# Patient Record
Sex: Female | Born: 1950 | Race: Black or African American | Hispanic: No | Marital: Married | Smoking: Never smoker
Health system: Southern US, Community
[De-identification: ages and names within clinical notes are randomized; demographics above are authoritative.]

## PROBLEM LIST (undated history)

## (undated) DIAGNOSIS — F0390 Unspecified dementia without behavioral disturbance: Secondary | ICD-10-CM

## (undated) DIAGNOSIS — E119 Type 2 diabetes mellitus without complications: Secondary | ICD-10-CM

## (undated) DIAGNOSIS — R079 Chest pain, unspecified: Secondary | ICD-10-CM

## (undated) DIAGNOSIS — Z5111 Encounter for antineoplastic chemotherapy: Secondary | ICD-10-CM

## (undated) DIAGNOSIS — K219 Gastro-esophageal reflux disease without esophagitis: Secondary | ICD-10-CM

## (undated) DIAGNOSIS — E7800 Pure hypercholesterolemia, unspecified: Secondary | ICD-10-CM

## (undated) DIAGNOSIS — K222 Esophageal obstruction: Secondary | ICD-10-CM

## (undated) DIAGNOSIS — B029 Zoster without complications: Secondary | ICD-10-CM

## (undated) DIAGNOSIS — J309 Allergic rhinitis, unspecified: Secondary | ICD-10-CM

## (undated) DIAGNOSIS — K449 Diaphragmatic hernia without obstruction or gangrene: Secondary | ICD-10-CM

## (undated) DIAGNOSIS — D509 Iron deficiency anemia, unspecified: Secondary | ICD-10-CM

## (undated) DIAGNOSIS — C9000 Multiple myeloma not having achieved remission: Secondary | ICD-10-CM

## (undated) DIAGNOSIS — M5412 Radiculopathy, cervical region: Secondary | ICD-10-CM

## (undated) DIAGNOSIS — Z86010 Personal history of colonic polyps: Secondary | ICD-10-CM

## (undated) DIAGNOSIS — R001 Bradycardia, unspecified: Secondary | ICD-10-CM

## (undated) DIAGNOSIS — M810 Age-related osteoporosis without current pathological fracture: Secondary | ICD-10-CM

## (undated) DIAGNOSIS — E876 Hypokalemia: Secondary | ICD-10-CM

## (undated) HISTORY — PX: TUBAL LIGATION: SHX77

## (undated) HISTORY — DX: Diaphragmatic hernia without obstruction or gangrene: K44.9

## (undated) HISTORY — DX: Pure hypercholesterolemia, unspecified: E78.00

## (undated) HISTORY — DX: Iron deficiency anemia, unspecified: D50.9

## (undated) HISTORY — DX: Multiple myeloma not having achieved remission: C90.00

## (undated) HISTORY — DX: Encounter for antineoplastic chemotherapy: Z51.11

## (undated) HISTORY — DX: Age-related osteoporosis without current pathological fracture: M81.0

## (undated) HISTORY — DX: Zoster without complications: B02.9

## (undated) HISTORY — DX: Allergic rhinitis, unspecified: J30.9

## (undated) HISTORY — DX: Personal history of colonic polyps: Z86.010

## (undated) HISTORY — DX: Hypokalemia: E87.6

## (undated) HISTORY — DX: Type 2 diabetes mellitus without complications: E11.9

## (undated) HISTORY — DX: Gastro-esophageal reflux disease without esophagitis: K21.9

## (undated) HISTORY — DX: Chest pain, unspecified: R07.9

## (undated) HISTORY — DX: Bradycardia, unspecified: R00.1

## (undated) HISTORY — DX: Esophageal obstruction: K22.2

## (undated) HISTORY — DX: Radiculopathy, cervical region: M54.12

---
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
## 1998-08-03 ENCOUNTER — Ambulatory Visit (HOSPITAL_COMMUNITY): Admission: RE | Admit: 1998-08-03 | Discharge: 1998-08-03 | Payer: Self-pay | Admitting: Endocrinology

## 1998-08-03 ENCOUNTER — Encounter: Payer: Self-pay | Admitting: Endocrinology

## 1999-10-16 ENCOUNTER — Other Ambulatory Visit: Admission: RE | Admit: 1999-10-16 | Discharge: 1999-10-16 | Payer: Self-pay | Admitting: Gynecology

## 2001-10-26 ENCOUNTER — Encounter: Admission: RE | Admit: 2001-10-26 | Discharge: 2002-01-24 | Payer: Self-pay | Admitting: Endocrinology

## 2002-07-04 ENCOUNTER — Encounter: Payer: Self-pay | Admitting: Emergency Medicine

## 2002-07-04 ENCOUNTER — Emergency Department (HOSPITAL_COMMUNITY): Admission: EM | Admit: 2002-07-04 | Discharge: 2002-07-04 | Payer: Self-pay | Admitting: Emergency Medicine

## 2002-12-06 ENCOUNTER — Other Ambulatory Visit: Admission: RE | Admit: 2002-12-06 | Discharge: 2002-12-06 | Payer: Self-pay | Admitting: Gynecology

## 2003-12-08 ENCOUNTER — Other Ambulatory Visit: Admission: RE | Admit: 2003-12-08 | Discharge: 2003-12-08 | Payer: Self-pay | Admitting: Gynecology

## 2004-12-04 ENCOUNTER — Ambulatory Visit: Payer: Self-pay | Admitting: Endocrinology

## 2004-12-10 ENCOUNTER — Other Ambulatory Visit: Admission: RE | Admit: 2004-12-10 | Discharge: 2004-12-10 | Payer: Self-pay | Admitting: Gynecology

## 2004-12-11 ENCOUNTER — Ambulatory Visit: Payer: Self-pay | Admitting: Endocrinology

## 2005-01-10 ENCOUNTER — Ambulatory Visit: Payer: Self-pay | Admitting: Endocrinology

## 2005-01-22 ENCOUNTER — Ambulatory Visit: Payer: Self-pay | Admitting: Endocrinology

## 2005-02-13 ENCOUNTER — Ambulatory Visit: Payer: Self-pay | Admitting: Endocrinology

## 2005-02-21 ENCOUNTER — Ambulatory Visit: Payer: Self-pay | Admitting: Endocrinology

## 2005-07-16 ENCOUNTER — Ambulatory Visit: Payer: Self-pay | Admitting: Endocrinology

## 2005-11-26 ENCOUNTER — Ambulatory Visit: Payer: Self-pay | Admitting: Internal Medicine

## 2005-11-28 ENCOUNTER — Ambulatory Visit (HOSPITAL_COMMUNITY): Admission: RE | Admit: 2005-11-28 | Discharge: 2005-11-28 | Payer: Self-pay | Admitting: Internal Medicine

## 2005-12-10 ENCOUNTER — Ambulatory Visit: Payer: Self-pay | Admitting: Endocrinology

## 2005-12-16 ENCOUNTER — Ambulatory Visit: Payer: Self-pay | Admitting: Endocrinology

## 2005-12-19 ENCOUNTER — Other Ambulatory Visit: Admission: RE | Admit: 2005-12-19 | Discharge: 2005-12-19 | Payer: Self-pay | Admitting: Gynecology

## 2005-12-26 ENCOUNTER — Ambulatory Visit: Payer: Self-pay | Admitting: Internal Medicine

## 2006-03-15 ENCOUNTER — Ambulatory Visit: Payer: Self-pay | Admitting: Family Medicine

## 2006-05-19 ENCOUNTER — Ambulatory Visit: Payer: Self-pay | Admitting: Endocrinology

## 2006-07-16 ENCOUNTER — Ambulatory Visit: Payer: Self-pay | Admitting: Endocrinology

## 2006-08-27 ENCOUNTER — Ambulatory Visit: Payer: Self-pay | Admitting: Endocrinology

## 2006-08-27 LAB — CONVERTED CEMR LAB: Hgb A1c MFr Bld: 6.2 % — ABNORMAL HIGH (ref 4.6–6.0)

## 2006-12-16 ENCOUNTER — Ambulatory Visit: Payer: Self-pay | Admitting: Internal Medicine

## 2006-12-16 LAB — CONVERTED CEMR LAB
ALT: 24 units/L (ref 0–40)
BUN: 11 mg/dL (ref 6–23)
Basophils Relative: 0.6 % (ref 0.0–1.0)
Bilirubin Urine: NEGATIVE
CO2: 27 meq/L (ref 19–32)
Creatinine, Ser: 0.9 mg/dL (ref 0.4–1.2)
Creatinine,U: 196 mg/dL
Glucose, Bld: 125 mg/dL — ABNORMAL HIGH (ref 70–99)
HCT: 36.7 % (ref 36.0–46.0)
Hemoglobin: 12.4 g/dL (ref 12.0–15.0)
Lymphocytes Relative: 38.1 % (ref 12.0–46.0)
MCV: 79.1 fL (ref 78.0–100.0)
Microalb Creat Ratio: 3.1 mg/g (ref 0.0–30.0)
Microalb, Ur: 0.6 mg/dL (ref 0.0–1.9)
Monocytes Absolute: 0.4 10*3/uL (ref 0.2–0.7)
Neutro Abs: 2.7 10*3/uL (ref 1.4–7.7)
Platelets: 220 10*3/uL (ref 150–400)
Potassium: 3.8 meq/L (ref 3.5–5.1)
RBC: 4.64 M/uL (ref 3.87–5.11)
Specific Gravity, Urine: >=1.03 (ref 1.000–1.03)
Total Bilirubin: 0.6 mg/dL (ref 0.3–1.2)
Total Protein, Urine: NEGATIVE mg/dL
Total Protein: 7.2 g/dL (ref 6.0–8.3)
Urine Glucose: NEGATIVE mg/dL
Urobilinogen, UA: 0.2 (ref 0.0–1.0)
WBC: 5.2 10*3/uL (ref 4.5–10.5)

## 2006-12-23 ENCOUNTER — Ambulatory Visit: Payer: Self-pay | Admitting: Endocrinology

## 2007-01-05 ENCOUNTER — Emergency Department (HOSPITAL_COMMUNITY): Admission: EM | Admit: 2007-01-05 | Discharge: 2007-01-05 | Payer: Self-pay | Admitting: Emergency Medicine

## 2007-01-15 ENCOUNTER — Ambulatory Visit: Payer: Self-pay | Admitting: Endocrinology

## 2007-01-15 LAB — CONVERTED CEMR LAB
ALT: 35 units/L (ref 0–40)
AST: 34 units/L (ref 0–37)
HDL: 35.9 mg/dL — ABNORMAL LOW (ref >39.0)
Hgb A1c MFr Bld: 6.5 % — ABNORMAL HIGH (ref 4.6–6.0)
LDL Cholesterol: 71 mg/dL (ref 0–99)
Total Bilirubin: 0.7 mg/dL (ref 0.3–1.2)
Total Protein: 7.4 g/dL (ref 6.0–8.3)
Triglycerides: 76 mg/dL (ref 0–149)
VLDL: 15 mg/dL (ref 0–40)

## 2007-03-10 ENCOUNTER — Ambulatory Visit: Payer: Self-pay | Admitting: Gastroenterology

## 2007-03-18 ENCOUNTER — Ambulatory Visit: Payer: Self-pay | Admitting: Endocrinology

## 2007-04-15 ENCOUNTER — Ambulatory Visit: Payer: Self-pay | Admitting: Gastroenterology

## 2007-04-15 DIAGNOSIS — K222 Esophageal obstruction: Secondary | ICD-10-CM

## 2007-04-15 DIAGNOSIS — K449 Diaphragmatic hernia without obstruction or gangrene: Secondary | ICD-10-CM | POA: Insufficient documentation

## 2007-04-15 HISTORY — DX: Esophageal obstruction: K22.2

## 2007-04-15 HISTORY — PX: ESOPHAGOGASTRODUODENOSCOPY: SHX1529

## 2007-04-15 HISTORY — DX: Diaphragmatic hernia without obstruction or gangrene: K44.9

## 2007-04-15 LAB — HM COLONOSCOPY

## 2007-04-16 ENCOUNTER — Encounter: Payer: Self-pay | Admitting: Gastroenterology

## 2007-04-24 ENCOUNTER — Encounter: Payer: Self-pay | Admitting: Endocrinology

## 2007-04-24 DIAGNOSIS — I10 Essential (primary) hypertension: Secondary | ICD-10-CM

## 2007-04-24 DIAGNOSIS — D509 Iron deficiency anemia, unspecified: Secondary | ICD-10-CM

## 2007-04-24 DIAGNOSIS — M810 Age-related osteoporosis without current pathological fracture: Secondary | ICD-10-CM

## 2007-04-24 DIAGNOSIS — Z860100 Personal history of colon polyps, unspecified: Secondary | ICD-10-CM

## 2007-04-24 HISTORY — DX: Personal history of colon polyps, unspecified: Z86.0100

## 2007-04-24 HISTORY — DX: Iron deficiency anemia, unspecified: D50.9

## 2007-04-24 HISTORY — DX: Personal history of colonic polyps: Z86.010

## 2007-04-24 HISTORY — DX: Age-related osteoporosis without current pathological fracture: M81.0

## 2007-07-16 ENCOUNTER — Telehealth: Payer: Self-pay | Admitting: Endocrinology

## 2007-07-17 ENCOUNTER — Ambulatory Visit: Payer: Self-pay | Admitting: Endocrinology

## 2007-07-17 LAB — CONVERTED CEMR LAB
AST: 40 units/L — ABNORMAL HIGH (ref 0–37)
Total Bilirubin: 0.9 mg/dL (ref 0.3–1.2)

## 2007-07-29 ENCOUNTER — Ambulatory Visit: Payer: Self-pay | Admitting: Endocrinology

## 2007-12-11 ENCOUNTER — Telehealth: Payer: Self-pay | Admitting: Family Medicine

## 2007-12-12 ENCOUNTER — Ambulatory Visit: Payer: Self-pay | Admitting: Family Medicine

## 2007-12-12 DIAGNOSIS — J069 Acute upper respiratory infection, unspecified: Secondary | ICD-10-CM | POA: Insufficient documentation

## 2007-12-16 ENCOUNTER — Ambulatory Visit: Payer: Self-pay | Admitting: Endocrinology

## 2007-12-16 LAB — CONVERTED CEMR LAB
ALT: 29 units/L (ref 0–35)
Bilirubin Urine: NEGATIVE
Bilirubin, Direct: 0.1 mg/dL (ref 0.0–0.3)
GFR calc non Af Amer: 69 mL/min
Hgb A1c MFr Bld: 6.4 % — ABNORMAL HIGH (ref 4.6–6.0)
MCHC: 32.5 g/dL (ref 30.0–36.0)
MCV: 78.4 fL (ref 78.0–100.0)
Monocytes Absolute: 0.5 10*3/uL (ref 0.2–0.7)
Monocytes Relative: 9.5 % (ref 3.0–11.0)
Neutrophils Relative %: 46.9 % (ref 43.0–77.0)
Nitrite: NEGATIVE
Platelets: 206 10*3/uL (ref 150–400)
RBC: 4.24 M/uL (ref 3.87–5.11)
RDW: 15.7 % — ABNORMAL HIGH (ref 11.5–14.6)
Specific Gravity, Urine: 1.02 (ref 1.000–1.03)
TSH: 3.76 microintl units/mL (ref 0.35–5.50)
Total CHOL/HDL Ratio: 4.6
Total Protein, Urine: NEGATIVE mg/dL
Total Protein: 7.2 g/dL (ref 6.0–8.3)
Urine Glucose: NEGATIVE mg/dL
Urobilinogen, UA: 0.2 (ref 0.0–1.0)
VLDL: 20 mg/dL (ref 0–40)
WBC: 5 10*3/uL (ref 4.5–10.5)

## 2007-12-24 ENCOUNTER — Ambulatory Visit: Payer: Self-pay | Admitting: Endocrinology

## 2007-12-24 DIAGNOSIS — R079 Chest pain, unspecified: Secondary | ICD-10-CM

## 2007-12-29 ENCOUNTER — Telehealth: Payer: Self-pay | Admitting: Endocrinology

## 2008-01-04 ENCOUNTER — Encounter: Payer: Self-pay | Admitting: Endocrinology

## 2008-01-04 ENCOUNTER — Ambulatory Visit: Payer: Self-pay

## 2008-01-04 ENCOUNTER — Encounter: Payer: Self-pay | Admitting: Internal Medicine

## 2008-01-05 ENCOUNTER — Encounter: Payer: Self-pay | Admitting: Endocrinology

## 2008-01-05 ENCOUNTER — Ambulatory Visit: Payer: Self-pay | Admitting: Internal Medicine

## 2008-01-25 ENCOUNTER — Telehealth (INDEPENDENT_AMBULATORY_CARE_PROVIDER_SITE_OTHER): Payer: Self-pay | Admitting: *Deleted

## 2008-02-12 ENCOUNTER — Telehealth (INDEPENDENT_AMBULATORY_CARE_PROVIDER_SITE_OTHER): Payer: Self-pay | Admitting: *Deleted

## 2008-03-18 ENCOUNTER — Ambulatory Visit: Payer: Self-pay | Admitting: Endocrinology

## 2008-03-18 DIAGNOSIS — E119 Type 2 diabetes mellitus without complications: Secondary | ICD-10-CM | POA: Insufficient documentation

## 2008-03-18 HISTORY — DX: Type 2 diabetes mellitus without complications: E11.9

## 2008-03-28 ENCOUNTER — Ambulatory Visit: Payer: Self-pay | Admitting: Endocrinology

## 2008-03-30 LAB — CONVERTED CEMR LAB
Basophils Absolute: 0 10*3/uL (ref 0.0–0.1)
Basophils Relative: 0.7 % (ref 0.0–1.0)
Eosinophils Absolute: 0.1 10*3/uL (ref 0.0–0.7)
HCT: 35.1 % — ABNORMAL LOW (ref 36.0–46.0)
Hgb A1c MFr Bld: 6.4 % — ABNORMAL HIGH (ref 4.6–6.0)
Iron: 37 ug/dL — ABNORMAL LOW (ref 42–145)
Lymphocytes Relative: 48.1 % — ABNORMAL HIGH (ref 12.0–46.0)
MCHC: 33.6 g/dL (ref 30.0–36.0)
MCV: 81.7 fL (ref 78.0–100.0)
Neutrophils Relative %: 41.8 % — ABNORMAL LOW (ref 43.0–77.0)
Platelets: 194 10*3/uL (ref 150–400)

## 2008-07-18 ENCOUNTER — Ambulatory Visit: Payer: Self-pay | Admitting: Endocrinology

## 2008-08-02 ENCOUNTER — Telehealth (INDEPENDENT_AMBULATORY_CARE_PROVIDER_SITE_OTHER): Payer: Self-pay | Admitting: *Deleted

## 2008-08-02 ENCOUNTER — Ambulatory Visit: Payer: Self-pay | Admitting: Endocrinology

## 2008-08-02 LAB — CONVERTED CEMR LAB
Basophils Absolute: 0 10*3/uL (ref 0.0–0.1)
Eosinophils Relative: 2.3 % (ref 0.0–5.0)
Hgb A1c MFr Bld: 6.4 % — ABNORMAL HIGH (ref 4.6–6.0)
MCV: 83.6 fL (ref 78.0–100.0)
Monocytes Absolute: 0.3 10*3/uL (ref 0.1–1.0)
Neutro Abs: 1.9 10*3/uL (ref 1.4–7.7)
Transferrin: 214.4 mg/dL (ref >=212.0)
WBC: 4.4 10*3/uL — ABNORMAL LOW (ref 4.5–10.5)

## 2008-09-01 ENCOUNTER — Ambulatory Visit: Payer: Self-pay | Admitting: Gastroenterology

## 2008-09-01 DIAGNOSIS — R1319 Other dysphagia: Secondary | ICD-10-CM

## 2008-09-20 ENCOUNTER — Ambulatory Visit (HOSPITAL_COMMUNITY): Admission: RE | Admit: 2008-09-20 | Discharge: 2008-09-20 | Payer: Self-pay | Admitting: Gastroenterology

## 2008-12-29 ENCOUNTER — Ambulatory Visit: Payer: Self-pay | Admitting: Endocrinology

## 2008-12-29 LAB — CONVERTED CEMR LAB

## 2008-12-31 LAB — CONVERTED CEMR LAB
ALT: 27 units/L (ref 0–35)
AST: 26 units/L (ref 0–37)
Alkaline Phosphatase: 58 units/L (ref 39–117)
Bilirubin Urine: NEGATIVE
CO2: 29 meq/L (ref 19–32)
Calcium: 9.1 mg/dL (ref 8.4–10.5)
Chloride: 109 meq/L (ref 96–112)
Cholesterol: 116 mg/dL (ref 0–200)
GFR calc non Af Amer: 82.65 mL/min (ref >60)
HCT: 37.3 % (ref 36.0–46.0)
Hemoglobin, Urine: NEGATIVE
LDL Cholesterol: 61 mg/dL (ref 0–99)
MCV: 83.8 fL (ref 78.0–100.0)
Neutro Abs: 2.1 10*3/uL (ref 1.4–7.7)
Platelets: 187 10*3/uL (ref 150.0–400.0)
TSH: 3.8 microintl units/mL (ref 0.35–5.50)
Total Bilirubin: 0.9 mg/dL (ref 0.3–1.2)
Total CHOL/HDL Ratio: 4
Total Protein, Urine: NEGATIVE mg/dL
Triglycerides: 129 mg/dL (ref 0.0–149.0)
Urobilinogen, UA: 0.2 (ref 0.0–1.0)
VLDL: 25.8 mg/dL (ref 0.0–40.0)
pH: 5 (ref 5.0–8.0)

## 2009-01-02 ENCOUNTER — Ambulatory Visit: Payer: Self-pay | Admitting: Endocrinology

## 2009-01-02 DIAGNOSIS — J309 Allergic rhinitis, unspecified: Secondary | ICD-10-CM | POA: Insufficient documentation

## 2009-01-02 HISTORY — DX: Allergic rhinitis, unspecified: J30.9

## 2009-01-02 LAB — CONVERTED CEMR LAB: Hgb A1c MFr Bld: 6.4 % (ref 4.6–6.5)

## 2009-01-30 ENCOUNTER — Encounter: Payer: Self-pay | Admitting: Endocrinology

## 2009-05-15 ENCOUNTER — Ambulatory Visit: Payer: Self-pay | Admitting: Internal Medicine

## 2009-05-15 ENCOUNTER — Telehealth: Payer: Self-pay | Admitting: Endocrinology

## 2009-05-15 DIAGNOSIS — S59919A Unspecified injury of unspecified forearm, initial encounter: Secondary | ICD-10-CM

## 2009-05-15 DIAGNOSIS — S59909A Unspecified injury of unspecified elbow, initial encounter: Secondary | ICD-10-CM

## 2009-05-15 DIAGNOSIS — S6990XA Unspecified injury of unspecified wrist, hand and finger(s), initial encounter: Secondary | ICD-10-CM

## 2009-05-17 ENCOUNTER — Telehealth: Payer: Self-pay | Admitting: Endocrinology

## 2009-06-26 ENCOUNTER — Telehealth: Payer: Self-pay | Admitting: Endocrinology

## 2009-07-03 ENCOUNTER — Telehealth: Payer: Self-pay | Admitting: Endocrinology

## 2009-07-03 ENCOUNTER — Ambulatory Visit: Payer: Self-pay | Admitting: Endocrinology

## 2009-07-03 DIAGNOSIS — H60399 Other infective otitis externa, unspecified ear: Secondary | ICD-10-CM | POA: Insufficient documentation

## 2009-07-03 DIAGNOSIS — E7800 Pure hypercholesterolemia, unspecified: Secondary | ICD-10-CM

## 2009-07-03 DIAGNOSIS — H6120 Impacted cerumen, unspecified ear: Secondary | ICD-10-CM

## 2009-07-03 DIAGNOSIS — IMO0001 Reserved for inherently not codable concepts without codable children: Secondary | ICD-10-CM

## 2009-07-03 HISTORY — DX: Pure hypercholesterolemia, unspecified: E78.00

## 2009-07-25 ENCOUNTER — Telehealth: Payer: Self-pay | Admitting: Endocrinology

## 2009-09-12 ENCOUNTER — Ambulatory Visit: Payer: Self-pay | Admitting: Endocrinology

## 2009-09-20 ENCOUNTER — Telehealth: Payer: Self-pay | Admitting: Endocrinology

## 2009-09-28 LAB — CONVERTED CEMR LAB
Cholesterol: 215 mg/dL — ABNORMAL HIGH (ref 0–200)
Direct LDL: 152.6 mg/dL
HDL: 26.3 mg/dL — ABNORMAL LOW (ref >39.00)
Total CHOL/HDL Ratio: 8

## 2009-12-07 ENCOUNTER — Telehealth: Payer: Self-pay | Admitting: Endocrinology

## 2009-12-17 ENCOUNTER — Emergency Department (HOSPITAL_COMMUNITY): Admission: EM | Admit: 2009-12-17 | Discharge: 2009-12-17 | Payer: Self-pay | Admitting: Emergency Medicine

## 2009-12-18 ENCOUNTER — Ambulatory Visit: Payer: Self-pay | Admitting: Endocrinology

## 2009-12-18 DIAGNOSIS — L84 Corns and callosities: Secondary | ICD-10-CM

## 2009-12-18 LAB — CONVERTED CEMR LAB
ALT: 26 units/L (ref 0–35)
Albumin: 3.6 g/dL (ref 3.5–5.2)
Alkaline Phosphatase: 51 units/L (ref 39–117)
BUN: 10 mg/dL (ref 6–23)
Bilirubin Urine: NEGATIVE
Calcium, Total (PTH): 9 mg/dL (ref 8.4–10.5)
Calcium: 8.7 mg/dL (ref 8.4–10.5)
Cholesterol: 120 mg/dL (ref 0–200)
Eosinophils Absolute: 0.1 10*3/uL (ref 0.0–0.7)
HCT: 37.3 % (ref 36.0–46.0)
HDL: 32.7 mg/dL — ABNORMAL LOW (ref >39.00)
Ketones, ur: NEGATIVE mg/dL
MCV: 87.2 fL (ref 78.0–100.0)
Microalb Creat Ratio: 3.9 mg/g (ref 0.0–30.0)
Microalb, Ur: 0.6 mg/dL (ref 0.0–1.9)
Neutro Abs: 2.6 10*3/uL (ref 1.4–7.7)
Neutrophils Relative %: 46.8 % (ref 43.0–77.0)
Potassium: 3.6 meq/L (ref 3.5–5.1)
RBC: 4.28 M/uL (ref 3.87–5.11)
Sodium: 142 meq/L (ref 135–145)
Specific Gravity, Urine: >=1.03 (ref 1.000–1.030)
Total CHOL/HDL Ratio: 4
Total Protein, Urine: NEGATIVE mg/dL
Total Protein: 7.1 g/dL (ref 6.0–8.3)
Triglycerides: 154 mg/dL — ABNORMAL HIGH (ref 0.0–149.0)
Urine Glucose: NEGATIVE mg/dL
Urobilinogen, UA: 0.2 (ref 0.0–1.0)
WBC: 5.3 10*3/uL (ref 4.5–10.5)
pH: 5 (ref 5.0–8.0)

## 2009-12-27 ENCOUNTER — Encounter: Payer: Self-pay | Admitting: Endocrinology

## 2010-02-01 ENCOUNTER — Ambulatory Visit: Payer: Self-pay | Admitting: Endocrinology

## 2010-02-01 DIAGNOSIS — M5412 Radiculopathy, cervical region: Secondary | ICD-10-CM | POA: Insufficient documentation

## 2010-02-01 HISTORY — DX: Radiculopathy, cervical region: M54.12

## 2010-02-05 LAB — HM MAMMOGRAPHY

## 2010-02-09 ENCOUNTER — Telehealth: Payer: Self-pay | Admitting: Endocrinology

## 2010-02-16 ENCOUNTER — Ambulatory Visit (HOSPITAL_COMMUNITY): Admission: RE | Admit: 2010-02-16 | Discharge: 2010-02-16 | Payer: Self-pay | Admitting: Endocrinology

## 2010-02-19 ENCOUNTER — Ambulatory Visit: Payer: Self-pay | Admitting: Endocrinology

## 2010-02-19 LAB — CONVERTED CEMR LAB
ALT: 38 units/L — ABNORMAL HIGH (ref 0–35)
AST: 37 units/L (ref 0–37)
Basophils Relative: 0.5 % (ref 0.0–3.0)
Bilirubin Urine: NEGATIVE
CO2: 27 meq/L (ref 19–32)
Chloride: 107 meq/L (ref 96–112)
Cholesterol: 121 mg/dL (ref 0–200)
Creatinine, Ser: 0.8 mg/dL (ref 0.4–1.2)
Eosinophils Absolute: 0.1 10*3/uL (ref 0.0–0.7)
Eosinophils Relative: 1.6 % (ref 0.0–5.0)
Glucose, Bld: 117 mg/dL — ABNORMAL HIGH (ref 70–99)
Ketones, ur: NEGATIVE mg/dL
Lymphocytes Relative: 47.1 % — ABNORMAL HIGH (ref 12.0–46.0)
Lymphs Abs: 2.4 10*3/uL (ref 0.7–4.0)
Neutro Abs: 2.3 10*3/uL (ref 1.4–7.7)
Neutrophils Relative %: 44.1 % (ref 43.0–77.0)
Nitrite: NEGATIVE
Platelets: 181 10*3/uL (ref 150.0–400.0)
Potassium: 4 meq/L (ref 3.5–5.1)
Sodium: 140 meq/L (ref 135–145)
Specific Gravity, Urine: 1.025 (ref 1.000–1.030)
Total CHOL/HDL Ratio: 4
Urine Glucose: NEGATIVE mg/dL
Urobilinogen, UA: 0.2 (ref 0.0–1.0)
VLDL: 28.6 mg/dL (ref 0.0–40.0)
WBC: 5.2 10*3/uL (ref 4.5–10.5)

## 2010-02-23 ENCOUNTER — Ambulatory Visit: Payer: Self-pay | Admitting: Endocrinology

## 2010-04-27 ENCOUNTER — Ambulatory Visit: Payer: Self-pay | Admitting: Internal Medicine

## 2010-04-27 ENCOUNTER — Encounter: Payer: Self-pay | Admitting: Endocrinology

## 2010-06-01 ENCOUNTER — Ambulatory Visit: Payer: Self-pay | Admitting: Endocrinology

## 2010-06-01 LAB — CONVERTED CEMR LAB
ALT: 38 units/L — ABNORMAL HIGH (ref 0–35)
AST: 37 units/L (ref 0–37)
Albumin: 3.5 g/dL (ref 3.5–5.2)
Alkaline Phosphatase: 48 units/L (ref 39–117)

## 2010-06-05 ENCOUNTER — Telehealth: Payer: Self-pay | Admitting: Endocrinology

## 2010-09-04 ENCOUNTER — Telehealth: Payer: Self-pay | Admitting: Gastroenterology

## 2010-09-05 ENCOUNTER — Telehealth: Payer: Self-pay | Admitting: Endocrinology

## 2010-09-05 ENCOUNTER — Ambulatory Visit: Payer: Self-pay | Admitting: Endocrinology

## 2010-09-05 ENCOUNTER — Telehealth: Payer: Self-pay | Admitting: Gastroenterology

## 2010-09-05 LAB — CONVERTED CEMR LAB
ALT: 31 units/L (ref 0–35)
Albumin: 3.6 g/dL (ref 3.5–5.2)
BUN: 10 mg/dL (ref 6–23)
Bilirubin, Direct: 0.1 mg/dL (ref 0.0–0.3)
CO2: 28 meq/L (ref 19–32)
Chloride: 105 meq/L (ref 96–112)
Direct LDL: 145.7 mg/dL
GFR calc non Af Amer: 82.17 mL/min (ref >60.00)
HDL: 29.3 mg/dL — ABNORMAL LOW (ref >39.00)
Hgb A1c MFr Bld: 6.2 % (ref 4.6–6.5)
Sodium: 139 meq/L (ref 135–145)

## 2010-09-17 ENCOUNTER — Telehealth: Payer: Self-pay | Admitting: Endocrinology

## 2010-09-21 ENCOUNTER — Ambulatory Visit: Payer: Self-pay | Admitting: Gastroenterology

## 2010-09-21 DIAGNOSIS — K219 Gastro-esophageal reflux disease without esophagitis: Secondary | ICD-10-CM

## 2010-09-21 HISTORY — DX: Gastro-esophageal reflux disease without esophagitis: K21.9

## 2010-09-21 LAB — CONVERTED CEMR LAB
ALT: 23 units/L (ref 0–35)
AST: 24 units/L (ref 0–37)
Alkaline Phosphatase: 52 units/L (ref 39–117)
BUN: 12 mg/dL (ref 6–23)
Basophils Absolute: 0 10*3/uL (ref 0.0–0.1)
Basophils Relative: 0.3 % (ref 0.0–3.0)
Calcium: 9.4 mg/dL (ref 8.4–10.5)
Creatinine, Ser: 0.8 mg/dL (ref 0.4–1.2)
Eosinophils Absolute: 0.1 10*3/uL (ref 0.0–0.7)
Eosinophils Relative: 1.9 % (ref 0.0–5.0)
Hemoglobin: 12.6 g/dL (ref 12.0–15.0)
Iron: 101 ug/dL (ref 42–145)
MCV: 86.1 fL (ref 78.0–100.0)
Monocytes Absolute: 0.4 10*3/uL (ref 0.1–1.0)
Neutro Abs: 2.3 10*3/uL (ref 1.4–7.7)
Neutrophils Relative %: 46.3 % (ref 43.0–77.0)
Platelets: 180 10*3/uL (ref 150.0–400.0)
RBC: 4.3 M/uL (ref 3.87–5.11)
Saturation Ratios: 36.3 % (ref 20.0–50.0)
Sed Rate: 17 mm/hr (ref 0–22)
Sodium: 140 meq/L (ref 135–145)
TSH: 3.69 microintl units/mL (ref 0.35–5.50)
Total Protein: 6.9 g/dL (ref 6.0–8.3)
Transferrin: 198.6 mg/dL — ABNORMAL LOW (ref 212.0–360.0)
WBC: 5.1 10*3/uL (ref 4.5–10.5)

## 2010-09-25 ENCOUNTER — Ambulatory Visit (HOSPITAL_COMMUNITY)
Admission: RE | Admit: 2010-09-25 | Discharge: 2010-09-25 | Payer: Self-pay | Source: Home / Self Care | Attending: Gastroenterology | Admitting: Gastroenterology

## 2010-10-30 ENCOUNTER — Other Ambulatory Visit: Payer: Self-pay | Admitting: Gastroenterology

## 2010-10-30 ENCOUNTER — Encounter (INDEPENDENT_AMBULATORY_CARE_PROVIDER_SITE_OTHER): Payer: Self-pay | Admitting: *Deleted

## 2010-10-30 ENCOUNTER — Ambulatory Visit
Admission: RE | Admit: 2010-10-30 | Discharge: 2010-10-30 | Payer: Self-pay | Source: Home / Self Care | Attending: Gastroenterology | Admitting: Gastroenterology

## 2010-10-30 NOTE — Progress Notes (Signed)
Summary: PPI Question   Phone Note Call from Patient Call back at [phone]   Caller: Patient Call For: Dr. Jaquita Rector for Call: Talk to Nurse Summary of Call: Wants to know when she should stop taking her acid reflux medication Initial call taken by: [place] Johnson,  September 04, 2010 11:52 AM  Follow-up for Phone Call        advised pt she is on long term PPI treatment and that she will need an office visit to get another refill. She states that she will call back to schedule her office visit on a later day, once she makes an appt I will send in a refill to her pharmacy. Follow-up by: Harlow Mares CMA Duncan Dull),  September 04, 2010 3:54 PM

## 2010-10-30 NOTE — Miscellaneous (Signed)
Summary: BONE DENSITY  Clinical Lists Changes  Orders: Added new Test order of T-Bone Densitometry (77080) - Signed Added new Test order of T-Lumbar Vertebral Assessment (77082) - Signed 

## 2010-10-30 NOTE — Assessment & Plan Note (Signed)
Summary: ER FU--PER KELLY--CHEST PAIN OVER WKEND--STC   Vital Signs:  Patient profile:   60 year old female Height:      66 inches (167.64 cm) Weight:      205.38 pounds (93.35 kg) O2 Sat:      96 % on Room air Temp:     98.2 degrees F (36.78 degrees C) oral Pulse rate:   89 / minute BP sitting:   108 / 84  (left arm) Cuff size:   large  Vitals Entered By: Josph Macho RMA (December 18, 2009 2:20 PM)  O2 Flow:  Room air CC: Follow-up visit after Emergency Room visit/ Pt states she had chest pain yesterday (12/17/09)/ CF Is Patient Diabetic? No   Primary Provider:  Romero Belling, MD  CC:  Follow-up visit after Emergency Room visit/ Pt states she had chest pain yesterday (12/17/09)/ CF.  History of Present Illness: the status of 3 chronic medical problems is addressed today: pt was seen in er yesterday for chest pain.  it was a single episode, lasting 3 minutes.  she feels this was due to stopping her prevacid.  the pain has resolved with resumption of the prevacid. pt says she takes fe 1/day.  no brbpr. pt states she has few years of small "corns" between the toes of the left foot.  no associated numbness   Current Medications (verified): 1)  Adult Aspirin Low Strength 81 Mg  Tbdp (Aspirin) .... Take 1 By Mouth Qd 2)  Zetia 10 Mg  Tabs (Ezetimibe) .... Take 1 By Mouth Qd 3)  Calcium 500 + D 500-125 Mg-Unit  Tabs (Calcium Carbonate-Vitamin D) .... Take 1 By Mouth Qd 4)  Bl Vitamin E 400 Unit  Caps (Vitamin E) .... Take 1 By Mouth Qd 5)  Glucophage Xr 500 Mg Tb24 (Metformin Hcl) .... Take 1 By Mouth Qd 6)  Prevacid 30 Mg Cpdr (Lansoprazole) .... Take 1 Capsule By Mouth Once A Day Prn 7)  Ferrous Sulfate 325 (65 Fe) Mg  Tabs (Ferrous Sulfate) .... 2 Tablets By Mouth Once Daily 8)  Neomycin-Polymyxin-Hc 3.5-10000-1 Soln (Neomycin-Polymyxin-Hc) .Marland Kitchen.. 1 Drop Tid 9)  Fluticasone Propionate 50 Mcg/act Susp (Fluticasone Propionate) .... 2 Sprays Each Nostril Qd 10)  Crestor 20 Mg Tabs  (Rosuvastatin Calcium) .Marland Kitchen.. 1 By Mouth Once Daily 11)  Lansoprazole 30 Mg Tbdp (Lansoprazole) .Marland Kitchen.. 1 Capsule By Mouth Once Daily 1 Hr Before Meals  Allergies (verified): 1)  ! Sulfa 2)  Sulfamethoxazole (Sulfamethoxazole)  Past History:  Past Medical History: Last updated: 04/24/2007 Colonic polyps, hx of Hypertension Osteoporosis Hyperglycemia Menopause Leukopenia Anemia-iron deficiency  Review of Systems  The patient denies syncope and dyspnea on exertion.    Physical Exam  General:  normal appearance.   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 1 capsule by mouth once daily 1 hr before meals  Other Orders: T-D-Dimer Fibrin Derivatives Quantitive (09811-91478) T-Parathyroid Hormone, Intact w/ Calcium (29562-13086) TLB-Lipid Panel (80061-LIPID) TLB-BMP (Basic Metabolic Panel-BMET) (80048-METABOL) TLB-CBC Platelet - w/Differential (85025-CBCD) TLB-Hepatic/Liver Function Pnl (80076-HEPATIC) TLB-TSH (Thyroid Stimulating Hormone) (84443-TSH) TLB-A1C / Hgb A1C (Glycohemoglobin) (83036-A1C) TLB-Microalbumin/Creat Ratio, Urine (82043-MALB) TLB-Udip w/ Micro (81001-URINE) TLB-IBC Pnl (Iron/FE;Transferrin) (83550-IBC) Est. Patient Level IV (57846)  Patient Instructions: 1)  blood tests today. 2)  please schedule a physical soon. 3)  pending the test results, please continue the same medications for now. 4)  (update: i left message on phone-tree:  increase vitamin-d  to 2000 units/day 5)  refer podiatry

## 2010-10-30 NOTE — Assessment & Plan Note (Signed)
Summary: CPX /NWS  #   Vital Signs:  Patient profile:   60 year old female Height:      66 inches (167.64 cm) Weight:      209.25 pounds (95.11 kg) O2 Sat:      97 % on Room air Temp:     98.1 degrees F (36.72 degrees C) oral Pulse rate:   66 / minute BP sitting:   130 / 78  (left arm) Cuff size:   large  Vitals Entered By: Josph Macho RMA (Feb 23, 2010 1:14 PM)  O2 Flow:  Room air CC: Physical/ pt states she is no longer using Neomycin or Fluticasone/ CF   Primary Provider:  Romero Belling, MD  CC:  Physical/ pt states she is no longer using Neomycin or Fluticasone/ CF.  History of Present Illness: here for regular wellness examination.  she's feeling pretty well in general, and does not drink or smoke.   Current Medications (verified): 1)  Adult Aspirin Low Strength 81 Mg  Tbdp (Aspirin) .... Take 1 By Mouth Qd 2)  Zetia 10 Mg  Tabs (Ezetimibe) .... Take 1 By Mouth Qd 3)  Calcium 500 + D 500-125 Mg-Unit  Tabs (Calcium Carbonate-Vitamin D) .... Take 1 By Mouth Qd 4)  Bl Vitamin E 400 Unit  Caps (Vitamin E) .... Take 1 By Mouth Qd 5)  Glucophage Xr 500 Mg Tb24 (Metformin Hcl) .... Take 1 By Mouth Qd 6)  Ferrous Sulfate 325 (65 Fe) Mg  Tabs (Ferrous Sulfate) .... 2 Tablets By Mouth Once Daily 7)  Neomycin-Polymyxin-Hc 3.5-10000-1 Soln (Neomycin-Polymyxin-Hc) .Marland Kitchen.. 1 Drop Tid 8)  Fluticasone Propionate 50 Mcg/act Susp (Fluticasone Propionate) .... 2 Sprays Each Nostril Qd 9)  Crestor 20 Mg Tabs (Rosuvastatin Calcium) .Marland Kitchen.. 1 By Mouth Once Daily 10)  Lansoprazole 30 Mg Tbdp (Lansoprazole) .Marland Kitchen.. 1 Capsule By Mouth Once Daily 1 Hr Before Meals 11)  Medrol (Pak) 4 Mg Tabs (Methylprednisolone) .... As Dir 12)  Onetouch Ultra Test  Strp (Glucose Blood) .... Once Daily, and Lancets  250.00  Allergies (verified): 1)  ! Sulfa 2)  Sulfamethoxazole (Sulfamethoxazole)  Past History:  Past Medical History: Last updated: 04/24/2007 Colonic polyps, hx  of Hypertension Osteoporosis Hyperglycemia Menopause Leukopenia Anemia-iron deficiency  Family History: Reviewed history from 12/24/2007 and no changes required. mother had "brain tumor"  Social History: Reviewed history from 09/01/2008 and no changes required. 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  Alcohol Use - no Illicit Drug Use - no  Review of Systems       The patient complains of weight gain.  The patient denies fever, vision loss, decreased hearing, syncope, dyspnea on exertion, prolonged cough, headaches, abdominal pain, melena, hematochezia, severe indigestion/heartburn, hematuria, suspicious skin lesions, and depression.    Physical Exam  General:  normal appearance.   Head:  head: no deformity eyes: no periorbital swelling, no proptosis external nose and ears are normal mouth: no lesion seen Neck:  Supple without thyroid enlargement or tenderness.  Breasts:  sees gyn  Lungs:  Clear to auscultation bilaterally. Normal respiratory effort.  Heart:  Regular rate and rhythm without murmurs or gallops noted. Normal S1,S2.   Abdomen:  abdomen is soft, nontender.  no hepatosplenomegaly.   not distended.  no hernia  Rectal:  sees gyn  Genitalia:  sees gyn  Msk:  muscle bulk and strength are grossly normal.  no obvious joint swelling.  gait is normal and steady  Pulses:  dorsalis pedis intact bilat.  no carotid  bruit  Extremities:  no deformity.  no ulcer on the feet.  feet are of normal color and temp.  no edema  Neurologic:  cn 2-12 grossly intact.   readily moves all 4's.   sensation is intact to touch on the feet  Skin:  normal texture and temp.  no rash.  not diaphoretic  Cervical Nodes:  No significant adenopathy.  Psych:  Alert and cooperative; normal mood and affect; normal attention span and concentration.   Additional Exam:  (abnl lft are noted).   Impression & Recommendations:  Problem # 1:  ROUTINE GENERAL MEDICAL EXAM@HEALTH  CARE  FACL (ICD-V70.0)  Problem # 2:  abnormal lft uncertain etiology  Other Orders: Est. Patient 40-64 years (16109)  Preventive Care Screening  Pap Smear:    Date:  02/19/2010    Results:  historical   Mammogram:    Date:  02/05/2010    Results:  historical      gyn is dr Greta Doom. pneumovax is refused 2011   Patient Instructions: 1)  redouble dietary efforts. 2)  please consider these measures for your health:  minimize alcohol.  do not use tobacco products.  have a colonoscopy at least every 10 years from age 77.  keep firearms safely stored.  always use seat belts.  have working smoke alarms in your home.  see the dentist regularly.  never drive under the influence of alcohol or drugs (including prescription drugs).  3)  go to lab in 3 months for liver 573.9, and a1c 250.00  Preventive Care Screening  Pap Smear:    Date:  02/19/2010    Results:  historical   Mammogram:    Date:  02/05/2010    Results:  historical

## 2010-10-30 NOTE — Progress Notes (Signed)
Summary: Shoulder Pain  Phone Note Call from Patient   Summary of Call: Pt called stating that shoulder pain is not getting any better? Please advise? Initial call taken by: Josph Macho RMA,  Feb 09, 2010 3:28 PM  Follow-up for Phone Call        i ordered mri.  you will be called with a day and time for an appointment  Follow-up by: Minus Breeding MD,  Feb 09, 2010 3:45 PM  Additional Follow-up for Phone Call Additional follow up Details #1::        Notified pt with md response Additional Follow-up by: Orlan Leavens,  Feb 09, 2010 3:51 PM

## 2010-10-30 NOTE — Letter (Signed)
Summary: [facility]   Imported By: Lester Gardner 01/03/2010 07:32:14  _____________________________________________________________________  External Attachment:    Type:   Image     Comment:   External Document

## 2010-10-30 NOTE — Progress Notes (Signed)
Summary: Leg pain  Phone Note Call from Patient Call back at Home Phone [phone]   Caller: Patient Summary of Call: Pt called stating that she has been having pain in legs bilaterally. Pt states the last time she had leg pain it was because of Crestor. Pt is requesting Rx for pain and alternate medication. Initial call taken by: Margaret Pyle, CMA,  June 05, 2010 11:07 AM  Follow-up for Phone Call        change to lipitor 80 mg at bedtime go to lab in 3 mos for lipids 272.0  liver v58.69, and a1c 250.00 Follow-up by: Minus Breeding MD,  June 05, 2010 12:51 PM  Additional Follow-up for Phone Call Additional follow up Details #1::        Labs scheduled for 07/03/2010 Pt states she has taken Liptor before but also had side-effects with it. Additional Follow-up by: Margaret Pyle, CMA,  June 05, 2010 2:17 PM   New Allergies: ! CRESTOR (ROSUVASTATIN CALCIUM) ! LIPITOR (ATORVASTATIN CALCIUM) Additional Follow-up for Phone Call Additional follow up Details #2::    stop crestor for now Follow-up by: Minus Breeding MD,  June 05, 2010 3:54 PM  Additional Follow-up for Phone Call Additional follow up Details #3:: Details for Additional Follow-up Action Taken: Pt's spouse informed. pt will follow up at next OV Additional Follow-up by: Margaret Pyle, CMA,  June 06, 2010 11:47 AM  New/Updated Medications: LIPITOR 80 MG TABS (ATORVASTATIN CALCIUM) 1 tab at bedtime New Allergies: ! CRESTOR (ROSUVASTATIN CALCIUM) ! LIPITOR (ATORVASTATIN CALCIUM)Prescriptions: LIPITOR 80 MG TABS (ATORVASTATIN CALCIUM) 1 tab at bedtime  #30 x 11   Entered and Authorized by:   Minus Breeding MD   Signed by:   Minus Breeding MD on 06/05/2010   Method used:   [facility]  [address] [id number]       [address]       [place], [place]  [address]       Ph: [phone] or [phone]       Fax: [phone]  RxID:   [id number]

## 2010-10-30 NOTE — Assessment & Plan Note (Signed)
Summary: SHOULDER PAIN---STC   Vital Signs:  Patient profile:   60 year old female Weight:      208 pounds BMI:     33.69 O2 Sat:      96 % on Room air Temp:     98.2 degrees F oral Pulse rate:   78 / minute BP sitting:   120 / 82  (left arm) Cuff size:   large  Vitals Entered ByZella Ball Ewing (Feb 01, 2010 4:05 PM)  O2 Flow:  Room air CC: left Shoulder pain for 3 weeks/RE   Primary Provider:  Romero Belling, MD  CC:  left Shoulder pain for 3 weeks/RE.  History of Present Illness: pt states 3 weeks of pain rad from left shoulder to the posterior neck and to left ring and little fingers.  unable to cite precip cause such as local injury.  no associated numbness. she takes metformin as rx'ed.  Current Medications (verified): 1)  Adult Aspirin Low Strength 81 Mg  Tbdp (Aspirin) .... Take 1 By Mouth Qd 2)  Zetia 10 Mg  Tabs (Ezetimibe) .... Take 1 By Mouth Qd 3)  Calcium 500 + D 500-125 Mg-Unit  Tabs (Calcium Carbonate-Vitamin D) .... Take 1 By Mouth Qd 4)  Bl Vitamin E 400 Unit  Caps (Vitamin E) .... Take 1 By Mouth Qd 5)  Glucophage Xr 500 Mg Tb24 (Metformin Hcl) .... Take 1 By Mouth Qd 6)  Prevacid 30 Mg Cpdr (Lansoprazole) .... Take 1 Capsule By Mouth Once A Day Prn 7)  Ferrous Sulfate 325 (65 Fe) Mg  Tabs (Ferrous Sulfate) .... 2 Tablets By Mouth Once Daily 8)  Neomycin-Polymyxin-Hc 3.5-10000-1 Soln (Neomycin-Polymyxin-Hc) .Marland Kitchen.. 1 Drop Tid 9)  Fluticasone Propionate 50 Mcg/act Susp (Fluticasone Propionate) .... 2 Sprays Each Nostril Qd 10)  Crestor 20 Mg Tabs (Rosuvastatin Calcium) .Marland Kitchen.. 1 By Mouth Once Daily 11)  Lansoprazole 30 Mg Tbdp (Lansoprazole) .Marland Kitchen.. 1 Capsule By Mouth Once Daily 1 Hr Before Meals  Allergies (verified): 1)  ! Sulfa 2)  Sulfamethoxazole (Sulfamethoxazole)  Past History:  Past Medical History: Last updated: 04/24/2007 Colonic polyps, hx of Hypertension Osteoporosis Hyperglycemia Menopause Leukopenia Anemia-iron deficiency  Review of  Systems       The patient complains of headaches.         denies rash  Physical Exam  General:  normal appearance.   Neck:  full rom, without pain. Msk:  full rom of the joints of the lue, without pain.  normal strength.   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  Medications Added to Medication List This Visit: 1)  Medrol (pak) 4 Mg Tabs (Methylprednisolone) .... As dir 2)  Onetouch Ultra Test Strp (Glucose blood) .... Once daily, and lancets  250.00  Other Orders: Est. Patient Level IV (16109)  Patient Instructions: 1)  medrol dosepack.  this might increase your blood sugar for a week or so.   2)  here is a meter to check your blood sugar.  i sent a prescription for strips to your pharmacy.  check at least eash day for a week after starting the medrol.  if over 200, start januvia 100 mg once daily 3)  call in 1-2 weeks if symptoms are not better, to consider mri of the c-spine. Prescriptions: MEDROL (PAK) 4 MG TABS (METHYLPREDNISOLONE) as dir  #  1 pack x 0   Entered and Authorized by:   Minus Breeding MD   Signed by:   Minus Breeding MD on 02/01/2010   Method used:   [facility]  [address] [id number]       [address]       [place], [place]  [address]       Ph: [phone] or [phone]       Fax: [phone]   RxID:   [id number]

## 2010-10-30 NOTE — Assessment & Plan Note (Signed)
Summary: FLU SHOT/SAE Natale Milch   Nurse Visit   Allergies: 1)  ! Sulfa 2)  ! Crestor (Rosuvastatin Calcium) 3)  ! Lipitor (Atorvastatin Calcium) 4)  Sulfamethoxazole (Sulfamethoxazole)  Orders Added: 1)  Admin 1st Vaccine [90471] 2)  Flu Vaccine 25yrs + [16109] Flu Vaccine Consent Questions     Do you have a history of severe allergic reactions to this vaccine? no    Any prior history of allergic reactions to egg and/or gelatin? no    Do you have a sensitivity to the preservative Thimersol? no    Do you have a past history of Guillan-Barre Syndrome? no    Do you currently have an acute febrile illness? no    Have you ever had a severe reaction to latex? no    Vaccine information given and explained to patient? yes    Are you currently pregnant? no    Lot Number:[id number]   Exp Date:03/30/2011   Site Given  Left Deltoid IMu Vaccine 68yrs + [60454] .lbflu

## 2010-10-30 NOTE — Progress Notes (Signed)
Summary: lab request  Phone Note Call from Patient   Caller: Patient Reason for Call: Lab or Test Results Summary of Call: Pt also wants to add BMP to labs done this morning, dx code please? Initial call taken by: Brenton Grills CMA Duncan Dull),  September 05, 2010 10:52 AM  Follow-up for Phone Call        ok 401.9 Follow-up by: Minus Breeding MD,  September 05, 2010 11:29 AM  Additional Follow-up for Phone Call Additional follow up Details #1::        lab order placed in IDX Additional Follow-up by: Brenton Grills CMA Duncan Dull),  September 05, 2010 11:32 AM

## 2010-10-30 NOTE — Progress Notes (Signed)
Summary: Medication refill   Phone Note Call from Patient Call back at Home Phone [phone]   Caller: Patient Call For: Dr. Jarold Motto Summary of Call: Needs refill on her LANSOPRAZOLE....sch'd appt for 09-21-10 Initial call taken by: Karna Christmas,  September 05, 2010 8:15 AM  Follow-up for Phone Call        rx sent Follow-up by: Harlow Mares CMA Duncan Dull),  September 05, 2010 9:59 AM    Prescriptions: LANSOPRAZOLE 30 MG TBDP (LANSOPRAZOLE) 1 capsule by mouth once daily 1 hr before meals  #31 Capsule x 0   Entered by:   Harlow Mares CMA (AAMA)   Authorized by:   Mardella Layman MD [facility]   Signed by:   Harlow Mares CMA (AAMA) on 09/05/2010   Method used:   [facility]  [address] [id number]       [address]       [place], [place]  [address]       Ph: [phone] or [phone]       Fax: [phone]   RxID:   [id number]

## 2010-10-30 NOTE — Progress Notes (Signed)
Summary: Lab req  Phone Note Call from Patient Call back at Home Phone [phone]   Caller: Patient Summary of Call: Pt called requesting to have labs drawn today - A1C and Chol. Pt is also requesting flu vax, Okay to sch labs? Initial call taken by: Margaret Pyle, CMA,  September 05, 2010 8:43 AM  Follow-up for Phone Call        ok a1c is 250.00, and liver is 573.9.  Follow-up by: Minus Breeding MD,  September 05, 2010 9:03 AM  Additional Follow-up for Phone Call Additional follow up Details #1::        labs in IDX, pt put on nurse sch for fluvax Additional Follow-up by: Margaret Pyle, CMA,  September 05, 2010 9:09 AM

## 2010-10-30 NOTE — Progress Notes (Signed)
Summary: RX refill request  Phone Note Call from Patient   Caller: Patient [phone] Summary of Call: pt called requesting refill of Crestor. Rx had been denied as it had been removed from med list. Per phone note on 09/20/2009, pt and MD agreed that pt would retry medication. [facility] [address] Pt informed Initial call taken by: Sydell Axon  December 07, 2009 10:33 AM    New/Updated Medications: CRESTOR 20 MG TABS (ROSUVASTATIN CALCIUM) 1 by mouth once daily Prescriptions: CRESTOR 20 MG TABS (ROSUVASTATIN CALCIUM) 1 by mouth once daily  #30 x 5   Entered by:   Margaret Pyle, CMA   Authorized by:   Minus Breeding MD   Signed by:   Margaret Pyle, CMA on 12/07/2009   Method used:   [facility]  [address] [id number]       [address]       [place], [place]  [address]       Ph: [phone] or [phone]       Fax: [phone]   RxID:   [id number]

## 2010-11-01 NOTE — Assessment & Plan Note (Signed)
Summary: med refill--ch.    History of Present Illness Visit Type: Follow-up Visit Primary GI MD: Sheryn Bison MD FACP FAGA Primary Provider: Romero Belling, MD Requesting Provider: Romero Belling, MD Chief Complaint: Pt needs refills on Lansoprazole, No GI complaints History of Present Illness:   60 year old African American female followed for many years with IBS and acid reflux currently controlled with lansoprazole 30 mg a day. Apparently this past spring she was hospitalized with acute chest pain and had a negative cardiac evaluation. She denies any specific hepatobiliary complaints or lower gastrointestinal problems, but is on oral iron therapy and also Glucophage for glucose intolerance and daily Lipitor.  She denies current reflux symptoms or dysphagia. Her appetite is good her weight is stable. She is up-to-date on her endoscopy and colonoscopies.   GI Review of Systems      Denies abdominal pain, acid reflux, belching, bloating, chest pain, dysphagia with liquids, dysphagia with solids, heartburn, loss of appetite, nausea, vomiting, vomiting blood, weight loss, and  weight gain.        Denies anal fissure, black tarry stools, change in bowel habit, constipation, diarrhea, diverticulosis, fecal incontinence, heme positive stool, hemorrhoids, irritable bowel syndrome, jaundice, light color stool, liver problems, rectal bleeding, and  rectal pain.    Current Medications (verified): 1)  Adult Aspirin Low Strength 81 Mg  Tbdp (Aspirin) .... Take 1 By Mouth Qd 2)  Zetia 10 Mg  Tabs (Ezetimibe) .... Take 1 By Mouth Qd 3)  Calcium 500 + D 500-125 Mg-Unit  Tabs (Calcium Carbonate-Vitamin D) .... Take 1 By Mouth Qd 4)  Bl Vitamin E 400 Unit  Caps (Vitamin E) .... Take 1 By Mouth Qd 5)  Glucophage Xr 500 Mg Tb24 (Metformin Hcl) .... Take 1 By Mouth Qd 6)  Ferrous Sulfate 325 (65 Fe) Mg  Tabs (Ferrous Sulfate) .... 2 Tablets By Mouth Once Daily 7)  Crestor 20 Mg Tabs (Rosuvastatin  Calcium) .Marland Kitchen.. 1 By Mouth Once Daily 8)  Lansoprazole 30 Mg Tbdp (Lansoprazole) .Marland Kitchen.. 1 Capsule By Mouth Once Daily 1 Hr Before Meals 9)  Onetouch Ultra Test  Strp (Glucose Blood) .... Once Daily, and Lancets  250.00 10)  Lipitor 80 Mg Tabs (Atorvastatin Calcium) .Marland Kitchen.. 1 Tab At Bedtime  Allergies (verified): 1)  ! Sulfa 2)  ! Crestor (Rosuvastatin Calcium) 3)  ! Lipitor (Atorvastatin Calcium) 4)  Sulfamethoxazole (Sulfamethoxazole)  Past History:  Past medical, surgical, family and social histories (including risk factors) reviewed for relevance to current acute and chronic problems.  Past Medical History: Reviewed history from 04/24/2007 and no changes required. Colonic polyps, hx of Hypertension Osteoporosis Hyperglycemia Menopause Leukopenia Anemia-iron deficiency  Past Surgical History: Reviewed history from 04/24/2007 and no changes required. Tubal ligation EDG (04/15/2007) EKG (3/25/20080  Family History: Reviewed history from 12/24/2007 and no changes required. mother had "brain tumor" No FH of Colon Cancer:  Social History: Reviewed history from 09/01/2008 and no changes required. 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  Alcohol Use - no Illicit Drug Use - no  Review of Systems  The patient denies allergy/sinus, anemia, anxiety-new, arthritis/joint pain, back pain, blood in urine, breast changes/lumps, change in vision, confusion, cough, coughing up blood, depression-new, fainting, fatigue, fever, headaches-new, hearing problems, heart murmur, heart rhythm changes, itching, menstrual pain, muscle pains/cramps, night sweats, nosebleeds, pregnancy symptoms, shortness of breath, skin rash, sleeping problems, sore throat, swelling of feet/legs, swollen lymph glands, thirst - excessive , urination - excessive , urination changes/pain, urine leakage, vision changes, and  voice change.   CV:  Denies chest pains, angina, palpitations, syncope, dyspnea on exertion,  orthopnea, PND, peripheral edema, and claudication.  Vital Signs:  Patient profile:   60 year old female Height:      66 inches Weight:      202 pounds BMI:     32.72 BSA:     2.01 Pulse rate:   80 / minute Pulse rhythm:   regular BP sitting:   122 / 90  (left arm)  Vitals Entered By: Merri Ray CMA Duncan Dull) (September 21, 2010 10:01 AM)  Physical Exam  General:  Well developed, well nourished, no acute distress.healthy appearing.  healthy appearing.   Head:  Normocephalic and atraumatic. Eyes:  PERRLA, no icterus.exam deferred to patient's ophthalmologist.  exam deferred to patient's ophthalmologist.   Lungs:  Clear throughout to auscultation. Heart:  Regular rate and rhythm; no murmurs, rubs,  or bruits. Abdomen:  Soft, nontender and nondistended. No masses, hepatosplenomegaly or hernias noted. Normal bowel sounds. Extremities:  No clubbing, cyanosis, edema or deformities noted. Psych:  Alert and cooperative. Normal mood and affect.   Impression & Recommendations:  Problem # 1:  GERD (ICD-530.81) Assessment Improved Continue antireflux regime and daily PPI therapy. Ultrasound upper abdomen has been ordered to exclude cholelithiasis because of her episode of severe chest pain this past spring. Also repeat labs have been ordered including anemia profile. Orders: TLB-CBC Platelet - w/Differential (85025-CBCD) TLB-BMP (Basic Metabolic Panel-BMET) (80048-METABOL) TLB-Hepatic/Liver Function Pnl (80076-HEPATIC) TLB-TSH (Thyroid Stimulating Hormone) (84443-TSH) TLB-B12, Serum-Total ONLY ([address]109-U04) TLB-Folic Acid (Folate) (82746-FOL) TLB-Iron, (Fe) Total (83540-FE) TLB-IBC Pnl (Iron/FE;Transferrin) (83550-IBC) TLB-Sedimentation Rate (ESR) (85652-ESR) Ultrasound Abdomen (UAS)  Problem # 2:  CHEST PAIN (ICD-786.50) Assessment: Improved  Orders: TLB-CBC Platelet - w/Differential (85025-CBCD) TLB-BMP (Basic Metabolic Panel-BMET) (80048-METABOL) TLB-Hepatic/Liver Function Pnl  (80076-HEPATIC) TLB-TSH (Thyroid Stimulating Hormone) (84443-TSH) TLB-B12, Serum-Total ONLY (54098-J19) TLB-Folic Acid (Folate) (82746-FOL) TLB-Iron, (Fe) Total (83540-FE) TLB-IBC Pnl (Iron/FE;Transferrin) (83550-IBC) TLB-Sedimentation Rate (ESR) (85652-ESR) Ultrasound Abdomen (UAS)  Problem # 3:  OTHER DYSPHAGIA (JYN-829.56) Assessment: Improved  Problem # 4:  HYPERTENSION (ICD-401.9) Assessment: Improved blood pressure today 122/90 and she is to continue her other medications per primary care  Problem # 5:  COLONIC POLYPS, HX OF (ICD-V12.72) Assessment: Unchanged followup as per clinical protocol.  Problem # 6:  DM (ICD-250.00) Assessment: Unchanged Continue medications and followup with Dr. Everardo All as planned.  Patient Instructions: 1)  Your physician requests that you go to the basement floor of our office to have the following labwork completed before leaving today: [facility] Panel, Anemia Profile, Sedimentation Rate 2)  You have been scheduled for an abdominal ultrasound at [facility] on 09/25/10 (Tuesday) @ 8:00 am. [facility] at 7:45 am for registration. 3)  Please schedule a follow-up appointment in 1 month.  4)  Copy sent to : Romero Belling, MD 5)  The medication list was reviewed and reconciled.  All changed / newly prescribed medications were explained.  A complete medication list was provided to the patient / caregiver. Prescriptions: HYDROCODONE-ACETAMINOPHEN 7.5-500 MG TABS (HYDROCODONE-ACETAMINOPHEN) take one by mouth three times a day  #90 x 1   Entered by:   Harlow Mares CMA (AAMA)   Authorized by:   Mardella Layman MD [facility]   Signed by:   Harlow Mares CMA (AAMA) on 09/21/2010   Method used:   Print then Give to Patient   RxID:   [id number] LANSOPRAZOLE 30 MG TBDP (LANSOPRAZOLE) 1 capsule by mouth once daily 1 hr before meals  #  31 Capsule x 12   Entered by:   Harlow Mares CMA (AAMA)   Authorized by:   Mardella Layman MD [facility]   Signed  by:   Harlow Mares CMA (AAMA) on 09/21/2010   Method used:   [facility]  [address] [id number]       [address]       [place], [place]  [address]       Ph: [phone] or [phone]       Fax: [phone]   RxID:   [id number]

## 2010-11-01 NOTE — Progress Notes (Signed)
Summary: Chol meds  Phone Note Call from Patient   Caller: Patient [phone] Summary of Call: Pt called stating that she has not been on any medication for Cholesterol at time of lab work (see phone note 06/05/2010). Initial call taken by: Margaret Pyle, CMA,  September 17, 2010 10:25 AM  Follow-up for Phone Call        please resume lipitor Follow-up by: Minus Breeding MD,  September 17, 2010 1:02 PM  Additional Follow-up for Phone Call Additional follow up Details #1::        pt states she will try Lipitor but she had side effects with it when she previously took it. Pt will try again and call back if she has any problems. Additional Follow-up by: Margaret Pyle, CMA,  September 17, 2010 1:29 PM

## 2010-11-05 ENCOUNTER — Encounter: Payer: Self-pay | Admitting: Endocrinology

## 2010-11-05 ENCOUNTER — Ambulatory Visit (INDEPENDENT_AMBULATORY_CARE_PROVIDER_SITE_OTHER): Payer: BC Managed Care – PPO | Admitting: Endocrinology

## 2010-11-05 DIAGNOSIS — B029 Zoster without complications: Secondary | ICD-10-CM

## 2010-11-05 HISTORY — DX: Zoster without complications: B02.9

## 2010-11-06 ENCOUNTER — Other Ambulatory Visit: Payer: Self-pay | Admitting: Gastroenterology

## 2010-11-06 ENCOUNTER — Other Ambulatory Visit: Payer: BC Managed Care – PPO

## 2010-11-06 ENCOUNTER — Encounter (INDEPENDENT_AMBULATORY_CARE_PROVIDER_SITE_OTHER): Payer: Self-pay | Admitting: *Deleted

## 2010-11-06 DIAGNOSIS — Z86010 Personal history of colonic polyps: Secondary | ICD-10-CM

## 2010-11-07 ENCOUNTER — Telehealth: Payer: Self-pay | Admitting: Gastroenterology

## 2010-11-07 NOTE — Letter (Signed)
Summary: [place] [facility]: Immunoassay Fecal Occult Blood (iFOB) Order [facility]  [address] [place], [place] [address]   Phone: [phone]  Fax: [phone]      [place] [facility]: Immunoassay Fecal Occult Blood (iFOB) Order Form   October 30, 2010 MRN: [id number]   Victoria Gates 1951/03/07   Physicican Name: Dr. Sheryn Bison  Diagnosis Code: v12.72      Harlow Mares CMA (AAMA)

## 2010-11-07 NOTE — Assessment & Plan Note (Signed)
Summary: 5-MONTH F/U APPT...LSW.    History of Present Illness Visit Type: Follow-up Visit Primary GI MD: Sheryn Bison MD FACP FAGA Primary Provider: Romero Belling, MD Requesting Provider: na Chief Complaint: 1 month- IBS & GERD, medically controlled, no problems History of Present Illness:   this patient is completely asymptomatic from lansoprazole 30 mg a day. Review of her labs showed it she has no specific abnormalities. We will check a serum ferritin, she may be a disc and continue her oral iron replacement therapy.   GI Review of Systems      Denies abdominal pain, acid reflux, belching, bloating, chest pain, dysphagia with liquids, dysphagia with solids, heartburn, loss of appetite, nausea, vomiting, vomiting blood, weight loss, and  weight gain.        Denies anal fissure, black tarry stools, change in bowel habit, constipation, diarrhea, diverticulosis, fecal incontinence, heme positive stool, hemorrhoids, irritable bowel syndrome, jaundice, light color stool, liver problems, rectal bleeding, and  rectal pain.    Current Medications (verified): 1)  Adult Aspirin Low Strength 81 Mg  Tbdp (Aspirin) .... Take 1 By Mouth Qd 2)  Zetia 10 Mg  Tabs (Ezetimibe) .... Take 1 By Mouth Qd 3)  Calcium 500 + D 500-125 Mg-Unit  Tabs (Calcium Carbonate-Vitamin D) .... Take 1 By Mouth Qd 4)  Glucophage Xr 500 Mg Tb24 (Metformin Hcl) .... Take 1 By Mouth Qd 5)  Ferrous Sulfate 325 (65 Fe) Mg  Tabs (Ferrous Sulfate) .... 2 Tablets By Mouth Once Daily 6)  Lansoprazole 30 Mg Tbdp (Lansoprazole) .Marland Kitchen.. 1 Capsule By Mouth Once Daily 1 Hr Before Meals 7)  Onetouch Ultra Test  Strp (Glucose Blood) .... Once Daily, and Lancets  250.00 8)  Lipitor 80 Mg Tabs (Atorvastatin Calcium) .Marland Kitchen.. 1 Tab At Bedtime 9)  Fish Oil 1000 Mg Caps (Omega-3 Fatty Acids) .... Take 1 Capsule Every Other Day  Allergies (verified): 1)  ! Sulfa 2)  ! Crestor (Rosuvastatin Calcium) 3)  ! Lipitor (Atorvastatin Calcium) 4)   Sulfamethoxazole (Sulfamethoxazole)  Past History:  Past medical, surgical, family and social histories (including risk factors) reviewed for relevance to current acute and chronic problems.  Past Medical History: Reviewed history from 04/24/2007 and no changes required. Colonic polyps, hx of Hypertension Osteoporosis Hyperglycemia Menopause Leukopenia Anemia-iron deficiency  Past Surgical History: Reviewed history from 04/24/2007 and no changes required. Tubal ligation EDG (04/15/2007) EKG (3/25/20080  Family History: Reviewed history from 09/21/2010 and no changes required. mother had "brain tumor" No FH of Colon Cancer:  Social History: Reviewed history from 09/01/2008 and no changes required. 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  Alcohol Use - no Illicit Drug Use - no  Review of Systems  The patient denies allergy/sinus, anemia, anxiety-new, arthritis/joint pain, back pain, blood in urine, breast changes/lumps, change in vision, confusion, cough, coughing up blood, depression-new, fainting, fatigue, fever, headaches-new, hearing problems, heart murmur, heart rhythm changes, itching, menstrual pain, muscle pains/cramps, night sweats, nosebleeds, pregnancy symptoms, shortness of breath, skin rash, sleeping problems, sore throat, swelling of feet/legs, swollen lymph glands, thirst - excessive , urination - excessive , urination changes/pain, urine leakage, vision changes, and voice change.    Vital Signs:  Patient profile:   60 year old female Height:      66 inches Weight:      200.50 pounds BMI:     32.48 Pulse rate:   72 / minute Pulse rhythm:   regular BP sitting:   126 / 82  (right arm) Cuff  size:   regular  Vitals Entered By: June McMurray CMA Duncan Dull) (October 30, 2010 4:05 PM)  Physical Exam  General:  Well developed, well nourished, no acute distress.healthy appearing.   Head:  Normocephalic and atraumatic. Eyes:  PERRLA, no icterus. Psych:   Alert and cooperative. Normal mood and affect.   Impression & Recommendations:  Problem # 1:  GERD (ICD-530.81) Assessment Improved continue reflux regime and daily Prevacid therapy with p.r.n. followup as needed Orders: TLB-Ferritin (82728-FER)  Problem # 2:  ANEMIA-IRON DEFICIENCY (ICD-280.9) Assessment: Improved check serum ferritin level and IFOB to decide if we can stop iron. She is to increase her calcium with vitamin D 22 tablets a day.  Problem # 3:  CHEST PAIN (ICD-786.50) Assessment: Improved  Problem # 4:  DM (ICD-250.00) Assessment: Improved continue medications per Dr. Everardo All.  Patient Instructions: 1)  Copy sent to : Romero Belling, MD 2)  Please go to the basement today for your labs.  3)  Please continue current medications.  4)  The medication list was reviewed and reconciled.  All changed / newly prescribed medications were explained.  A complete medication list was provided to the patient / caregiver. 5)  Avoid foods high in acid content ( tomatoes, citrus juices, spicy foods) . Avoid eating within 3 to 4 hours of lying down or before exercising. Do not over eat; try smaller more frequent meals. Elevate head of bed four inches when sleeping.

## 2010-11-15 NOTE — Letter (Signed)
Summary: Out of Work  [facility]  [address] [place], [place] [address]   Phone: [phone]  Fax: [phone]    November 05, 2010   Employee:  Victoria Gates    To Whom It May Concern:   For Medical reasons, please excuse the above named employee from work for the following dates:  Start:   11/05/10  End:   11/08/10        Sincerely,    Minus Breeding MD

## 2010-11-15 NOTE — Progress Notes (Signed)
Summary: Lab results   Phone Note Call from Patient Call back at [phone]   Caller: Patient Call For: Dr. Jarold Motto Reason for Call: Lab or Test Results Summary of Call: Calling about Lab results Initial call taken by: Karna Christmas,  November 07, 2010 9:43 AM  Follow-up for Phone Call        Left a message on patients machine to call back.  Follow-up by: Harlow Mares CMA Duncan Dull),  November 07, 2010 9:53 AM  Additional Follow-up for Phone Call Additional follow up Details #1::        pt aware iFOB neg. Additional Follow-up by: Harlow Mares CMA (AAMA),  November 07, 2010 10:42 AM

## 2010-11-15 NOTE — Assessment & Plan Note (Signed)
Summary: PAIN IN LEGS/ RASH ON BACK AND SIDE /NWS   Vital Signs:  Patient profile:   60 year old female Height:      66 inches (167.64 cm) Weight:      198.50 pounds (90.23 kg) BMI:     32.15 O2 Sat:      97 % on Room air Temp:     98.7 degrees F (37.06 degrees C) oral Pulse rate:   85 / minute Pulse rhythm:   regular BP sitting:   110 / 80  (left arm) Cuff size:   regular  Vitals Entered By: Brenton Grills CMA (AAMA) (November 05, 2010 3:12 PM)  O2 Flow:  Room air CC: Rash on left side and lower back, pain in legs x 2 days/aj Is Patient Diabetic? Yes Comments Pt is due for mammogram and may be due for pap, pt has never had Zostavax   Referring Provider:  n/a Primary Provider:  Romero Belling, MD  CC:  Rash on left side and lower back and pain in legs x 2 days/aj.  History of Present Illness: pt states 2 days of moderate to severe pain at the left lumbar paravertebral area, and assoc rash there.    Current Medications (verified): 1)  Adult Aspirin Low Strength 81 Mg  Tbdp (Aspirin) .... Take 1 By Mouth Qd 2)  Zetia 10 Mg  Tabs (Ezetimibe) .... Take 1 By Mouth Qd 3)  Calcium 500 + D 500-125 Mg-Unit  Tabs (Calcium Carbonate-Vitamin D) .... Take 1 By Mouth Qd 4)  Glucophage Xr 500 Mg Tb24 (Metformin Hcl) .... Take 1 By Mouth Qd 5)  Lansoprazole 30 Mg Tbdp (Lansoprazole) .Marland Kitchen.. 1 Capsule By Mouth Once Daily 1 Hr Before Meals 6)  Onetouch Ultra Test  Strp (Glucose Blood) .... Once Daily, and Lancets  250.00 7)  Lipitor 80 Mg Tabs (Atorvastatin Calcium) .Marland Kitchen.. 1 Tab At Bedtime 8)  Fish Oil 1000 Mg Caps (Omega-3 Fatty Acids) .... Take 1 Capsule Every Other Day  Allergies (verified): 1)  ! Sulfa 2)  ! Crestor (Rosuvastatin Calcium) 3)  ! Lipitor (Atorvastatin Calcium) 4)  Sulfamethoxazole (Sulfamethoxazole)  Past History:  Past Medical History: Last updated: 04/24/2007 Colonic polyps, hx of Hypertension Osteoporosis Hyperglycemia Menopause Leukopenia Anemia-iron  deficiency  Review of Systems  The patient denies fever.    Physical Exam  General:  normal appearance.   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 1 tab three times a day 2)  Hydrocodone-acetaminophen 10-325 Mg Tabs (Hydrocodone-acetaminophen) .Marland Kitchen.. 1 every 4 hrs as needed for pain  Other Orders: Est. Patient Level III (60[address])  Patient Instructions: 1)  valacyclovir 100 mg three times a day. 2)  hydrocodone-acetaminophen 1 every 4 hrs as needed for pain. 3)  until the rash is healing, you should avoid contact with other people.   Prescriptions: HYDROCODONE-ACETAMINOPHEN 10-325 MG TABS (HYDROCODONE-ACETAMINOPHEN) 1 every 4 hrs as needed for pain  #50 x 2   Entered and Authorized by:   Minus Breeding MD   Signed by:   Minus Breeding MD on 11/05/2010   Method used:   Print then Give to Patient   RxID:   [id number] VALACYCLOVIR HCL 1 GM TABS (VALACYCLOVIR HCL) 1 tab three times a day  #21 x  0   Entered and Authorized by:   Minus Breeding MD   Signed by:   Minus Breeding MD on 11/05/2010   Method used:   [facility]  [address] [id number]       [address]       [place], [place]  [address]       Ph: [phone] or [phone]       Fax: [phone]   RxID:   [id number]    Orders Added: 1)  Est. Patient Level III [34742]

## 2010-12-23 LAB — POCT CARDIAC MARKERS: CKMB, poc: 1.2 ng/mL (ref 1.0–8.0)

## 2010-12-23 LAB — BASIC METABOLIC PANEL
BUN: 10 mg/dL (ref 6–23)
CO2: 26 mEq/L (ref 19–32)
Creatinine, Ser: 0.85 mg/dL (ref 0.4–1.2)
Potassium: 3.6 mEq/L (ref 3.5–5.1)
Sodium: 138 mEq/L (ref 135–145)

## 2010-12-23 LAB — DIFFERENTIAL
Basophils Absolute: 0 10*3/uL (ref 0.0–0.1)
Basophils Relative: 0 % (ref 0–1)
Eosinophils Relative: 3 % (ref 0–5)
Lymphocytes Relative: 39 % (ref 12–46)
Lymphs Abs: 2.3 10*3/uL (ref 0.7–4.0)
Monocytes Absolute: 0.5 10*3/uL (ref 0.1–1.0)
Neutro Abs: 3 10*3/uL (ref 1.7–7.7)

## 2010-12-23 LAB — CBC
HCT: 37.1 % (ref 36.0–46.0)
MCV: 86.1 fL (ref 78.0–100.0)
Platelets: 201 10*3/uL (ref 150–400)
RBC: 4.31 MIL/uL (ref 3.87–5.11)

## 2011-01-29 ENCOUNTER — Telehealth: Payer: Self-pay | Admitting: *Deleted

## 2011-01-29 DIAGNOSIS — D509 Iron deficiency anemia, unspecified: Secondary | ICD-10-CM

## 2011-01-29 NOTE — Telephone Encounter (Signed)
Patient aware she needs labs and will come 5/3 for them.

## 2011-01-29 NOTE — Telephone Encounter (Signed)
Message copied by Harlow Mares on Tue Jan 29, 2011 12:07 PM ------      Message from: Harlow Mares      Created: Wed Dec 05, 2010  9:44 AM       Cbc and iron studies

## 2011-01-31 ENCOUNTER — Other Ambulatory Visit (INDEPENDENT_AMBULATORY_CARE_PROVIDER_SITE_OTHER): Payer: BC Managed Care – PPO

## 2011-01-31 ENCOUNTER — Telehealth: Payer: Self-pay

## 2011-01-31 ENCOUNTER — Telehealth: Payer: Self-pay | Admitting: *Deleted

## 2011-01-31 DIAGNOSIS — D509 Iron deficiency anemia, unspecified: Secondary | ICD-10-CM

## 2011-01-31 LAB — VITAMIN B12: Vitamin B-12: 269 pg/mL (ref 211–911)

## 2011-01-31 LAB — CBC WITH DIFFERENTIAL/PLATELET
Basophils Absolute: 0 10*3/uL (ref 0.0–0.1)
Lymphocytes Relative: 48.1 % — ABNORMAL HIGH (ref 12.0–46.0)
Monocytes Relative: 5.7 % (ref 3.0–12.0)
Neutrophils Relative %: 43.9 % (ref 43.0–77.0)
Platelets: 203 10*3/uL (ref 150.0–400.0)
RDW: 14.2 % (ref 11.5–14.6)

## 2011-01-31 LAB — IBC PANEL
Iron: 64 ug/dL (ref 42–145)
Saturation Ratios: 24.3 % (ref 20.0–50.0)

## 2011-01-31 MED ORDER — EZETIMIBE 10 MG PO TABS: 10.0000 mg | ORAL_TABLET | Freq: Every day | ORAL | Status: DC

## 2011-01-31 NOTE — Telephone Encounter (Signed)
Pt advised via home VM as requested, Rx faxed to pharmacy

## 2011-01-31 NOTE — Telephone Encounter (Signed)
D/c lipitor Start zetia 10 mg qd.  i added to med list.  please send to pharmacy of pt's choice. cpx is due lat may, or june

## 2011-01-31 NOTE — Telephone Encounter (Signed)
Pt called requesting to stop Lipitor, pt states she is having leg pain and cramps on medication. Pt says she was taking Zetia and would like to go back to this medication, please advise (Lipitor is listed as an allergy)

## 2011-01-31 NOTE — Telephone Encounter (Signed)
Message copied by Harlow Mares on Thu Jan 31, 2011 12:59 PM ------      Message from: Jarold Motto, DAVID      Created: Thu Jan 31, 2011 12:07 PM       Repeat serum B12 level in 6 months

## 2011-01-31 NOTE — Telephone Encounter (Signed)
Left message that labs were normal and she will need repeat b12 in 6 months I will call her about this when it is time.

## 2011-02-12 NOTE — Assessment & Plan Note (Signed)
[facility]                         GASTROENTEROLOGY OFFICE NOTE   NAME:Gates, Victoria NYLAND                       MRN:          [id number]  DATE:03/10/2007                            DOB:          [birth year]    REFERRING PHYSICIAN:  Self   Victoria Gates is a 60 year old, African-American female self referred today  for evaluation of worsening acid reflux.   Victoria Gates has had acid reflux for many years and actually had endoscopy last  performed in March 2003. At that time she had a guaiac positive stool of  unexplained etiology. Workup at that time was unremarkable except for a  small hyperplastic colon polyps. No definite cause for anemia was  determined and it was felt that this was some menorrhagia. She currently  has undergone menopause and is no longer menstruating.   She relates over the last 2-3 years she has had rather typical  regurgitation and burning substernal chest pain. She had an episode of  solid food impaction several months ago but did not have to go to the  emergency room. She has tried over-the-counter Tums without significant  improvement. She denies any bowel irregularity, melena, hematochezia,  anorexia or weight loss. She follows a regular diet and denies any  specific food intolerances. She denies fever, chills, or any systemic  complaints.   PAST MEDICAL HISTORY:  Remarkable for hyperglycemia followed by Dr.  Everardo All, hyperlipidemia, benign leukopenia, hypertension, and  osteoporosis.   MEDICATIONS:  1. A variety of multivitamins.  2. Aspirin 81 mg a day.  3. Zetia 10 mg a day.  4. Crestor 20 mg a day.  5. Vitamin D daily.  6. Metformin 500 mg a day.   She in the past has had reactions to sulfa medications.   FAMILY HISTORY:  Remarkable for diabetes but no known gastrointestinal  problems.   SOCIAL HISTORY:  The patient is married and lives with her husband and  son. She has a Naval architect, works in a clerical job.  She does not  smoke or use ethanol.   REVIEW OF SYSTEMS:  Entirely noncontributory without current  cardiovascular, pulmonary, genitourinary, neurologic, orthopedic,  endocrine or neuropsychiatric problems.   PHYSICAL EXAMINATION:  GENERAL:  She is a healthy-appearing, black  female appearing younger than her stated age.  VITAL SIGNS:  She is 5 feet 6-1/2 inches tall and weighs 212 pounds.  Blood pressure 126/76 and pulse was 88 and regular.  HEENT:  I could not appreciate stigmata of chronic liver disease,  thyromegaly or lymphadenopathy.  CHEST:  Entirely clear.  CARDIAC:  She was in a regular rhythm without murmur, gallop or rub.  ABDOMEN:  There was no hepatosplenomegaly noted. There were no abdominal  masses or tenderness. Bowel sounds were normal.  EXTREMITIES:  Peripheral extremities were unremarkable.  MENTAL STATUS:  Clear.  RECTAL:  Deferred.   ASSESSMENT:  1. Chronic gastroesophageal reflux disease with probable peptic      stricture of the esophagus.  2. History of colon polyps and need for followup screening      colonoscopy.  3. Well controlled hypertension and  mild hyperglycemia.  4. History of chronic anemia probably from a gynecologic source.   RECOMMENDATIONS:  1. Outpatient endoscopy and probable dilatation.  2. Followup colonoscopy.  3. Reflux regime explained to patient and movie shown.  4. Start Prevacid 30 mg 30 minutes before the first meal of the day      and twice a day as needed.  5. Other medications as per doctors Everardo All and Greta Doom.     Victoria Gates. Jarold Motto, MD, Caleen Essex, FAGA  Electronically Signed    DRP/MedQ  DD: 03/10/2007  DT: 03/10/2007  Job #: [id number]   cc:   Gregary Signs A. Everardo All, MD  Luvenia Redden, M.D.

## 2011-03-11 DIAGNOSIS — J309 Allergic rhinitis, unspecified: Secondary | ICD-10-CM

## 2011-03-25 ENCOUNTER — Ambulatory Visit (INDEPENDENT_AMBULATORY_CARE_PROVIDER_SITE_OTHER): Payer: BC Managed Care – PPO | Admitting: Endocrinology

## 2011-03-25 ENCOUNTER — Encounter: Payer: Self-pay | Admitting: Endocrinology

## 2011-03-25 ENCOUNTER — Other Ambulatory Visit (INDEPENDENT_AMBULATORY_CARE_PROVIDER_SITE_OTHER): Payer: BC Managed Care – PPO

## 2011-03-25 VITALS — BP 110/68 | HR 77 | Temp 99.0°F | Ht 66.0 in | Wt 199.8 lb

## 2011-03-25 DIAGNOSIS — Z0000 Encounter for general adult medical examination without abnormal findings: Secondary | ICD-10-CM

## 2011-03-25 DIAGNOSIS — E119 Type 2 diabetes mellitus without complications: Secondary | ICD-10-CM

## 2011-03-25 DIAGNOSIS — Z23 Encounter for immunization: Secondary | ICD-10-CM

## 2011-03-25 NOTE — Patient Instructions (Addendum)
blood tests are being ordered for you today.  please call [phone] to hear your test results.  You will be prompted to enter the 9-digit "MRN" number that appears at the top left of this page, followed by #.  Then you will hear the message. please consider these measures for your health:  minimize alcohol.  do not use tobacco products.  have a colonoscopy at least every 10 years from age 61.  keep firearms safely stored.  always use seat belts.  have working smoke alarms in your home.  see an eye doctor and dentist regularly.  never drive under the influence of alcohol or drugs (including prescription drugs).   please let me know what your wishes would be, if artificial life support measures should become necessary.  it is critically important to prevent falling down (keep floor areas well-lit, dry, and free of loose objects) Please make a follow-up appointment in 6 months

## 2011-03-25 NOTE — Progress Notes (Signed)
Subjective:    Patient ID: Victoria Gates, female    DOB: [birth year], 60 y.o.   MRN: [id number]  HPI here for regular wellness examination.  she's feeling pretty well in general (except for left ear "blocked"), and says chronic med probs are stable, except as noted below.  Gyn is dr Greta Doom.   Past Medical History  Diagnosis Date  . COLONIC POLYPS, HX OF 04/24/2007  . HYPERTENSION 04/24/2007  . OSTEOPOROSIS 04/24/2007  . GERD 09/21/2010  . DM 03/18/2008  . HYPERCHOLESTEROLEMIA 07/03/2009  . ANEMIA-IRON DEFICIENCY 04/24/2007  . ALLERGIC RHINITIS CAUSE UNSPECIFIED 01/02/2009  . ESOPHAGEAL STRICTURE 04/15/2007  . HIATAL HERNIA 04/15/2007  . CERVICAL RADICULOPATHY, LEFT 02/01/2010  . 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 Tubal ligation   . Esophagogastroduodenoscopy 04/15/2007    History   Social History  . Marital Status: Married    Spouse Name: N/A    Number of Children: N/A  . Years of Education: N/A   Occupational History  . MAIL CLERK    Social History Main Topics  . Smoking status: Never Smoker   . Smokeless tobacco: Not on file  . Alcohol Use: No  . Drug Use: No  . Sexually Active:    Other Topics Concern  . Not on file   Social History Narrative  . No narrative on file    Current Outpatient Prescriptions on File Prior to Visit  Medication Sig Dispense Refill  . aspirin 81 MG tablet Take 81 mg by mouth daily.        . Calcium 500-125 MG-UNIT TABS Take 1 tablet by mouth daily.        Marland Kitchen ezetimibe (ZETIA) 10 MG tablet Take 1 tablet (10 mg total) by mouth daily.  30 tablet  11  . fish oil-omega-3 fatty acids 1000 MG capsule Take 1 capsule by mouth every other day.        Marland Kitchen glucose blood (ONE TOUCH ULTRA TEST) test strip Once daily dx 250.00       . lansoprazole (PREVACID) 30 MG capsule 1 capsule by mouth once daily 1 hour before meals       . metFORMIN (GLUCOPHAGE-XR) 500 MG 24 hr tablet Take 500 mg by mouth daily with breakfast.        .  DISCONTD: atorvastatin (LIPITOR) 80 MG tablet Take 80 mg by mouth at bedtime.        Marland Kitchen DISCONTD: HYDROcodone-acetaminophen (NORCO) 10-325 MG per tablet Take 1 tablet by mouth every 4 (four) hours as needed. For pain       . DISCONTD: valACYclovir (VALTREX) 1000 MG tablet Take 1,000 mg by mouth 3 (three) times daily.          Allergies  Allergen Reactions  . Atorvastatin     REACTION: myalgias  . Rosuvastatin     REACTION: myalgias  . Sulfamethoxazole     REACTION: unspecified  . Sulfonamide Derivatives     Family History  Problem Relation Age of Onset  . Cancer Neg Hx     No FH of Colon Cancer    BP 110/68  Pulse 77  Temp(Src) 99 F (37.2 C) (Oral)  Ht 5\' 6"  (1.676 m)  Wt 199 lb 12.8 oz (90.629 kg)  BMI 32.25 kg/m2  SpO2 97%  Review of Systems  Constitutional:       She has lost a few lbs, due to her efforts  HENT: Negative for hearing loss.  Eyes: Negative for visual disturbance.  Respiratory: Negative for shortness of breath.   Cardiovascular: Negative for chest pain.  Gastrointestinal: Negative for anal bleeding.  Genitourinary: Negative for hematuria.  Musculoskeletal: Negative for arthralgias.  Skin: Negative for rash.  Neurological: Negative for syncope.  Hematological: Does not bruise/bleed easily.  Psychiatric/Behavioral: Negative for dysphoric mood. The patient is not nervous/anxious.        Objective:   Physical Exam VS: see vs page GEN: no distress HEAD: head: no deformity Left eac:  Occluded with cerumen eyes: no periorbital swelling, no proptosis external nose and ears are normal mouth: no lesion seen NECK: supple, thyroid is not enlarged. CHEST WALL: no deformity BREASTS:  sees gyn CV: reg rate and rhythm, no murmur ABD: abdomen is soft, nontender.  no hepatosplenomegaly.  not distended.  no hernia GENITALIA:  sees gyn RECTAL: sees gyn MUSCULOSKELETAL: muscle bulk and strength are grossly normal.  no obvious joint swelling.  gait is  normal and steady EXTEMITIES: no deformity.  no ulcer on the feet.  feet are of normal color and temp.  no edema PULSES: dorsalis pedis intact bilat.  no carotid bruit NEURO:  cn 2-12 grossly intact.   readily moves all 4's.  sensation is intact to touch on the feet SKIN:  Normal texture and temperature.  No rash or suspicious lesion is visible.   NODES:  None palpable at the neck PSYCH: alert, oriented x3.  Does not appear anxious nor depressed.     Assessment & Plan:  Wellness visit today, with problems stable, except as noted. Procedure:  Left ear irrigated.  Repeat exam shows resolution of the cerumen impaction, and normal tm.

## 2011-04-15 ENCOUNTER — Other Ambulatory Visit: Payer: Self-pay | Admitting: Gynecology

## 2011-04-30 ENCOUNTER — Other Ambulatory Visit: Payer: Self-pay | Admitting: Endocrinology

## 2011-06-29 ENCOUNTER — Telehealth: Payer: Self-pay | Admitting: *Deleted

## 2011-06-29 NOTE — Telephone Encounter (Signed)
CALL A NURSE transferred patient thru to office. She c/o sinus congestion and drainage, req advisement. Pt reports no fever, sob, chills or body aches and mucus is clear. She is taking OTC store brand cough suppressant and tylenol which has given some relief. Advised pt to continue to rest, take meds as directed and come into office w/no improvement or if she becomes worse, pt agreed.

## 2011-07-02 ENCOUNTER — Telehealth: Payer: Self-pay

## 2011-07-02 NOTE — Telephone Encounter (Signed)
[facility] Triage Call Report Triage Record Num: [id number] Operator: Alphonsa Overall Patient Name: Victoria Gates Call Date & Time: 06/28/2011 7:40:15PM Patient Phone: [phone] PCP: Romero Belling Patient Gender: Female PCP Fax : [phone] Patient DOB: 04/11/1951 Practice Name: [facility] Reason for Call: [name] calling about URI symtoms. Onset 06/26/11. Getting some better, clear congestion in nose. Afebrile. Glucose wnl. Nasal congestion, occasional sneezing. Home care advice given. [name] aware and will f/u in am as needed. Protocol(s) Used: Diabetes: Respiratory Problems Recommended Outcome per Protocol: Provide Home/Self Care Reason for Outcome: Sore throat, cough or other upper respiratory infection (URI) symptoms with no temperature elevation Care Advice: ~ Use a cool mist humidifier to moisten air. Be sure to clean according to manufacturer's instructions. ~ Call provider if no improvement of symptoms in 24-48 hours. ~ SYMPTOM / CONDITION MANAGEMENT Most adults need to drink 6-10 eight-ounce glasses (1.2-2.0 liters) of fluids per day unless previously told to limit fluid intake for other medical reasons. Limit fluids that contain caffeine, sugar or alcohol. Urine will be a very light yellow color when you drink enough fluids. ~ Analgesic/Antipyretic Advice - Acetaminophen: Consider acetaminophen as directed on label or by pharmacist/provider for pain or fever PRECAUTIONS: - Use if there is no history of liver disease, alcoholism, or intake of three or more alcohol drinks per day - Only if approved by provider during pregnancy or when breastfeeding - During pregnancy, acetaminophen should not be taken more than 3 consecutive days without telling provider - Do not exceed recommended dose or frequency ~ Sore Throat Relief: - Use warm salt water gargles 3 to 4 times/day, as needed (1/2 tsp. salt in 8 oz. [.2 liters] water). - Suck on hard candy, nonprescription or herbal  throat lozenges (sugar-free if diabetic) - Eat soothing, soft food/fluids (broths, soups, or honey and lemon juice in hot tea, Popsicles, frozen yogurt or sherbet, scrambled eggs, cooked cereals, Jell-O or puddings) whichever is most comforting. - Avoid eating salty, spicy or acidic foods. ~ Respiratory Hygiene: - Cover the nose/mouth tightly with a tissue when coughing or sneezing. - Use tissue 1 time and discard in the nearest waste receptacle. - Wash hands with soap and water or alcohol-based hand rub after coming into contact with respiratory secretions and contaminated objects/materials. - Alternatively when no tissue is available, cough into the bend of the elbow. - .Avoid touching your eyes, nose or mouth. ~ Diabetes Action Plan: - Follow recommended action plan and diet - Check blood sugar as recommended - Take diabetic pills or insulin as ordered - Follow action plan for illness. ~ 06/28/2011 7:52:45PM Page 1 of 1 [id number]

## 2011-09-07 ENCOUNTER — Other Ambulatory Visit: Payer: Self-pay | Admitting: Gastroenterology

## 2011-11-02 ENCOUNTER — Ambulatory Visit (INDEPENDENT_AMBULATORY_CARE_PROVIDER_SITE_OTHER): Payer: BC Managed Care – PPO | Admitting: Internal Medicine

## 2011-11-02 ENCOUNTER — Encounter: Payer: Self-pay | Admitting: Internal Medicine

## 2011-11-02 VITALS — BP 136/86 | HR 66 | Temp 97.6°F | Ht 66.0 in | Wt 190.0 lb

## 2011-11-02 DIAGNOSIS — E119 Type 2 diabetes mellitus without complications: Secondary | ICD-10-CM

## 2011-11-02 DIAGNOSIS — E7800 Pure hypercholesterolemia, unspecified: Secondary | ICD-10-CM

## 2011-11-02 DIAGNOSIS — D1720 Benign lipomatous neoplasm of skin and subcutaneous tissue of unspecified limb: Secondary | ICD-10-CM

## 2011-11-02 DIAGNOSIS — D1739 Benign lipomatous neoplasm of skin and subcutaneous tissue of other sites: Secondary | ICD-10-CM

## 2011-11-02 NOTE — Assessment & Plan Note (Addendum)
Managed with Zetia ,  No lipids in over one year, Fasting lipids ordered for review by Dr Everardo All   prior to next visit

## 2011-11-02 NOTE — Assessment & Plan Note (Signed)
Presumed, by exam.  Discussed methods of definitive diagnosis as well as imaging studies.  Recommended watchful waiting and reeval with PCP Rennis Harding .  She is leaning towards wanting it removed .

## 2011-11-02 NOTE — Assessment & Plan Note (Signed)
Historically well controlled on metformin,.  She is overdue for hgba1c and lipids.  These have been ordered and she will followup with Dr. Everardo All soon.

## 2011-11-02 NOTE — Progress Notes (Signed)
Subjective:    Patient ID: Victoria Gates, female    DOB: 12/08/1950, 61 y.o.   MRN: [id number]  HPI  Victoria Gates is a 61 yr old AA female with a history of DM, Type 2, hypertension presents with CC swollen place on right shoulder which has been present for approximately 4 weeks. Area is nontender,  Reduces slightly in size when she applies ice to it, but is generally stable in size.  from day to day.  She has a history of tendonitis in same shoulder which she believes occurred over 5 years ago.  No imaging studies of right shoulder  available for review.  She denies any history of trauma skin infection or abnormal acitivity.  Has lost 10 lbs intentionally since last visit with exercise (Zumba classes several times weekly).  Past Medical History  Diagnosis Date  . COLONIC POLYPS, HX OF 04/24/2007  . HYPERTENSION 04/24/2007  . OSTEOPOROSIS 04/24/2007  . GERD 09/21/2010  . DM 03/18/2008  . HYPERCHOLESTEROLEMIA 07/03/2009  . ANEMIA-IRON DEFICIENCY 04/24/2007  . ALLERGIC RHINITIS CAUSE UNSPECIFIED 01/02/2009  . ESOPHAGEAL STRICTURE 04/15/2007  . HIATAL HERNIA 04/15/2007  . CERVICAL RADICULOPATHY, LEFT 02/01/2010  . HERPES ZOSTER 11/05/2010   Current Outpatient Prescriptions on File Prior to Visit  Medication Sig Dispense Refill  . aspirin 81 MG tablet Take 81 mg by mouth daily.        . Calcium 500-125 MG-UNIT TABS Take 1 tablet by mouth daily.        Marland Kitchen ezetimibe (ZETIA) 10 MG tablet Take 1 tablet (10 mg total) by mouth daily.  30 tablet  11  . fish oil-omega-3 fatty acids 1000 MG capsule Take 1 capsule by mouth every other day.        Marland Kitchen glucose blood (ONE TOUCH ULTRA TEST) test strip Once daily dx 250.00       . lansoprazole (PREVACID) 30 MG capsule TAKE ONE CAPSULE BY MOUTH EVERY DAY 1 HOUR BEFORE MEALS  31 capsule  8  . metFORMIN (GLUCOPHAGE-XR) 500 MG 24 hr tablet Take 1 tablet (500 mg total) by mouth daily.  120 tablet  10    Review of Systems  Constitutional: Negative for fever, chills and  unexpected weight change.  HENT: Negative for hearing loss, ear pain, nosebleeds, congestion, sore throat, facial swelling, rhinorrhea, sneezing, mouth sores, trouble swallowing, neck pain, neck stiffness, voice change, postnasal drip, sinus pressure, tinnitus and ear discharge.   Eyes: Negative for pain, discharge, redness and visual disturbance.  Respiratory: Negative for cough, chest tightness, shortness of breath, wheezing and stridor.   Cardiovascular: Negative for chest pain, palpitations and leg swelling.  Musculoskeletal: Negative for myalgias and arthralgias.  Skin: Negative for color change and rash.  Neurological: Negative for dizziness, weakness, light-headedness and headaches.  Hematological: Negative for adenopathy.       Objective:   Physical Exam  Constitutional: She is oriented to person, place, and time. She appears well-developed and well-nourished.  HENT:  Mouth/Throat: Oropharynx is clear and moist.  Eyes: EOM are normal. Pupils are equal, round, and reactive to light. No scleral icterus.  Neck: Normal range of motion. Neck supple. No JVD present. No thyromegaly present.  Cardiovascular: Normal rate, regular rhythm, normal heart sounds and intact distal pulses.   Pulmonary/Chest: Effort normal and breath sounds normal.  Abdominal: Soft. Bowel sounds are normal. She exhibits no mass. There is no tenderness.  Musculoskeletal: Normal range of motion. She exhibits no edema.       Arms:  Lymphadenopathy:    She has no cervical adenopathy.  Neurological: She is alert and oriented to person, place, and time.  Skin: Skin is warm and dry.  Psychiatric: She has a normal mood and affect.   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  Discussed methods of definitive diagnosis as well as imaging studies.  Recommended watchful waiting and reeval with PCP Victoria Gates .  She is leaning towards wanting it removed  .  DM Historically well controlled on metformin,.  She is overdue for hgba1c and lipids.  These have been ordered and she will followup with Victoria Gates soon.  HYPERCHOLESTEROLEMIA Managed with Zetia ,  No lipids in over one year, Fasting lipids ordered for review by Dr Everardo Gates   prior to next visit     Updated Medication List Outpatient Encounter Prescriptions as of 11/02/2011  Medication Sig Dispense Refill  . aspirin 81 MG tablet Take 81 mg by mouth daily.       . Calcium 500-125 MG-UNIT TABS Take 1 tablet by mouth daily.        Marland Kitchen ezetimibe (ZETIA) 10 MG tablet Take 1 tablet (10 mg total) by mouth daily.  30 tablet  11  . fish oil-omega-3 fatty acids 1000 MG capsule Take 1 capsule by mouth every other day.        Marland Kitchen glucose blood (ONE TOUCH ULTRA TEST) test strip Once daily dx 250.00       . lansoprazole (PREVACID) 30 MG capsule TAKE ONE CAPSULE BY MOUTH EVERY DAY 1 HOUR BEFORE MEALS  31 capsule  8  . metFORMIN (GLUCOPHAGE-XR) 500 MG 24 hr tablet Take 1 tablet (500 mg total) by mouth daily.  120 tablet  10

## 2011-11-13 ENCOUNTER — Telehealth: Payer: Self-pay | Admitting: *Deleted

## 2011-11-13 DIAGNOSIS — D509 Iron deficiency anemia, unspecified: Secondary | ICD-10-CM

## 2011-11-13 DIAGNOSIS — Z0000 Encounter for general adult medical examination without abnormal findings: Secondary | ICD-10-CM

## 2011-11-13 DIAGNOSIS — E119 Type 2 diabetes mellitus without complications: Secondary | ICD-10-CM

## 2011-11-13 NOTE — Telephone Encounter (Signed)
Labs placed into Epic for upcoming CPX appt  

## 2011-11-13 NOTE — Telephone Encounter (Signed)
Message copied by Carin Primrose on Wed Nov 13, 2011 11:29 AM ------      Message from: COUSIN, SHARON T      Created: Tue Nov 05, 2011 11:58 AM      Regarding: 03/25/12  PHY DATE       Lynford Humphrey

## 2011-11-21 ENCOUNTER — Encounter (INDEPENDENT_AMBULATORY_CARE_PROVIDER_SITE_OTHER): Payer: BC Managed Care – PPO | Admitting: Endocrinology

## 2011-11-21 ENCOUNTER — Encounter: Payer: Self-pay | Admitting: Endocrinology

## 2011-11-21 NOTE — Progress Notes (Signed)
  Subjective:    Patient ID: Victoria Gates, female    DOB: [birth year], 61 y.o.   MRN: [id number]  HPI Pt says the mass over the right upper scapula is slightly smaller.     Review of Systems No shoulder pain.    Objective:   Physical Exam VITAL SIGNS:  See vs page GENERAL: no distress Upper right trapezius area:  3 cm mobile nontender mass.         Assessment & Plan:   This encounter was created in error - please disregard.

## 2011-11-21 NOTE — Patient Instructions (Addendum)
i would advise you to not have anything else done about the lump on your shoulder.  If it gets bigger, we can change our minds in the future, though.   Please come back for a regular physical appointment in 3 months.

## 2012-02-08 ENCOUNTER — Other Ambulatory Visit: Payer: Self-pay | Admitting: Endocrinology

## 2012-03-20 ENCOUNTER — Other Ambulatory Visit (INDEPENDENT_AMBULATORY_CARE_PROVIDER_SITE_OTHER): Payer: BC Managed Care – PPO

## 2012-03-20 DIAGNOSIS — Z0000 Encounter for general adult medical examination without abnormal findings: Secondary | ICD-10-CM

## 2012-03-20 DIAGNOSIS — E119 Type 2 diabetes mellitus without complications: Secondary | ICD-10-CM

## 2012-03-20 DIAGNOSIS — D509 Iron deficiency anemia, unspecified: Secondary | ICD-10-CM

## 2012-03-20 LAB — HEMOGLOBIN A1C: Hgb A1c MFr Bld: 5.9 % (ref 4.6–6.5)

## 2012-03-20 LAB — URINALYSIS, ROUTINE W REFLEX MICROSCOPIC
Bilirubin Urine: NEGATIVE
Leukocytes, UA: NEGATIVE
Nitrite: NEGATIVE
Specific Gravity, Urine: 1.01 (ref 1.000–1.030)
Urobilinogen, UA: 0.2 (ref 0.0–1.0)
pH: 6 (ref 5.0–8.0)

## 2012-03-20 LAB — BASIC METABOLIC PANEL
BUN: 12 mg/dL (ref 6–23)
CO2: 27 mEq/L (ref 19–32)
Calcium: 9.3 mg/dL (ref 8.4–10.5)
GFR: 71.57 mL/min (ref >60.00)
Glucose, Bld: 98 mg/dL (ref 70–99)

## 2012-03-20 LAB — CBC WITH DIFFERENTIAL/PLATELET
Basophils Absolute: 0 10*3/uL (ref 0.0–0.1)
Hemoglobin: 12.3 g/dL (ref 12.0–15.0)
Lymphocytes Relative: 53.5 % — ABNORMAL HIGH (ref 12.0–46.0)
Monocytes Relative: 6.8 % (ref 3.0–12.0)
Neutro Abs: 2 10*3/uL (ref 1.4–7.7)
RBC: 4.19 Mil/uL (ref 3.87–5.11)
RDW: 13.4 % (ref 11.5–14.6)

## 2012-03-20 LAB — LIPID PANEL
HDL: 34.7 mg/dL — ABNORMAL LOW (ref >39.00)
Triglycerides: 212 mg/dL — ABNORMAL HIGH (ref 0.0–149.0)
VLDL: 42.4 mg/dL — ABNORMAL HIGH (ref 0.0–40.0)

## 2012-03-20 LAB — HEPATIC FUNCTION PANEL
Albumin: 3.6 g/dL (ref 3.5–5.2)
Total Protein: 7.2 g/dL (ref 6.0–8.3)

## 2012-03-20 LAB — TSH: TSH: 6.81 u[IU]/mL — ABNORMAL HIGH (ref 0.35–5.50)

## 2012-03-25 ENCOUNTER — Ambulatory Visit (INDEPENDENT_AMBULATORY_CARE_PROVIDER_SITE_OTHER): Payer: BC Managed Care – PPO | Admitting: Endocrinology

## 2012-03-25 ENCOUNTER — Encounter: Payer: Self-pay | Admitting: Endocrinology

## 2012-03-25 VITALS — BP 112/70 | HR 55 | Temp 97.7°F | Ht 67.0 in | Wt 189.0 lb

## 2012-03-25 DIAGNOSIS — K219 Gastro-esophageal reflux disease without esophagitis: Secondary | ICD-10-CM

## 2012-03-25 DIAGNOSIS — E039 Hypothyroidism, unspecified: Secondary | ICD-10-CM

## 2012-03-25 DIAGNOSIS — E119 Type 2 diabetes mellitus without complications: Secondary | ICD-10-CM

## 2012-03-25 DIAGNOSIS — Z23 Encounter for immunization: Secondary | ICD-10-CM

## 2012-03-25 DIAGNOSIS — Z2911 Encounter for prophylactic immunotherapy for respiratory syncytial virus (RSV): Secondary | ICD-10-CM

## 2012-03-25 DIAGNOSIS — Z0000 Encounter for general adult medical examination without abnormal findings: Secondary | ICD-10-CM

## 2012-03-25 MED ORDER — COLESTIPOL HCL 1 G PO TABS: 2.0000 g | ORAL_TABLET | Freq: Every day | ORAL | Status: DC

## 2012-03-25 NOTE — Progress Notes (Signed)
Subjective:    Patient ID: Victoria Gates, female    DOB: Dec 14, 1950, 61 y.o.   MRN: [id number]  HPI here for regular wellness examination.  He's feeling pretty well in general, and says chronic med probs are stable, except as noted below Past Medical History  Diagnosis Date  . COLONIC POLYPS, HX OF 04/24/2007  . HYPERTENSION 04/24/2007  . OSTEOPOROSIS 04/24/2007  . GERD 09/21/2010  . DM 03/18/2008  . HYPERCHOLESTEROLEMIA 07/03/2009  . ANEMIA-IRON DEFICIENCY 04/24/2007  . ALLERGIC RHINITIS CAUSE UNSPECIFIED 01/02/2009  . ESOPHAGEAL STRICTURE 04/15/2007  . HIATAL HERNIA 04/15/2007  . CERVICAL RADICULOPATHY, LEFT 02/01/2010  . 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 Tubal ligation   . Esophagogastroduodenoscopy 04/15/2007    History   Social History  . Marital Status: Married    Spouse Name: N/A    Number of Children: N/A  . Years of Education: N/A   Occupational History  . MAIL CLERK    Social History Main Topics  . Smoking status: Never Smoker   . Smokeless tobacco: Not on file  . Alcohol Use: No  . Drug Use: No  . Sexually Active:    Other Topics Concern  . Not on file   Social History Narrative  . No narrative on file    Current Outpatient Prescriptions on File Prior to Visit  Medication Sig Dispense Refill  . aspirin 81 MG tablet Take 81 mg by mouth daily.       . Calcium 500-125 MG-UNIT TABS Take 1 tablet by mouth daily.        . fish oil-omega-3 fatty acids 1000 MG capsule Take 1 capsule by mouth every other day.        Marland Kitchen glucose blood (ONE TOUCH ULTRA TEST) test strip Once daily dx 250.00       . lansoprazole (PREVACID) 30 MG capsule TAKE ONE CAPSULE BY MOUTH EVERY DAY 1 HOUR BEFORE MEALS  31 capsule  8  . metFORMIN (GLUCOPHAGE-XR) 500 MG 24 hr tablet Take 1 tablet (500 mg total) by mouth daily.  120 tablet  10  . ZETIA 10 MG tablet TAKE 1 TABLET BY MOUTH EVERY DAY  30 tablet  4  . colestipol (COLESTID) 1 G tablet Take 2 tablets (2 g  total) by mouth daily.  60 tablet  11    Allergies  Allergen Reactions  . Atorvastatin     REACTION: myalgias  . Rosuvastatin     REACTION: myalgias  . Sulfamethoxazole     REACTION: unspecified  . Sulfonamide Derivatives     Family History  Problem Relation Age of Onset  . Cancer Neg Hx     No FH of Colon Cancer    BP 112/70  Pulse 55  Temp 97.7 F (36.5 C) (Oral)  Ht 5\' 7"  (1.702 m)  Wt 189 lb (85.73 kg)  BMI 29.60 kg/m2  SpO2 97%  Review of Systems  Constitutional: Negative for fever.  HENT: Negative for hearing loss.   Eyes: Negative for visual disturbance.  Respiratory: Negative for shortness of breath.   Cardiovascular: Negative for chest pain.  Gastrointestinal: Negative for anal bleeding.  Genitourinary: Negative for hematuria.  Musculoskeletal: Negative for back pain.  Skin: Negative for rash.  Neurological: Negative for syncope and headaches.  Hematological: Does not bruise/bleed easily.  Psychiatric/Behavioral: Negative for dysphoric mood.      Objective:   Physical Exam VS: see vs page GEN: no distress HEAD: head:  no deformity eyes: no periorbital swelling, no proptosis external nose and ears are normal mouth: no lesion seen NECK: supple, thyroid is not enlarged CHEST WALL: no deformity LUNGS:  Clear to auscultation BREASTS:  sees gyn CV: reg rate and rhythm, no murmur GENITALIA/RECTAL: sees gyn MUSCULOSKELETAL: muscle bulk and strength are grossly normal.  no obvious joint swelling.  gait is normal and steady EXTEMITIES: no deformity.  no ulcer on the feet.  feet are of normal color and temp.  no edema PULSES: dorsalis pedis intact bilat.  no carotid bruit NEURO:  cn 2-12 grossly intact.   readily moves all 4's.  sensation is intact to touch on the feet SKIN:  Normal texture and temperature.  No rash or suspicious lesion is visible.   NODES:  None palpable at the neck PSYCH: alert, oriented x3.  Does not appear anxious nor depressed.      Assessment & Plan:  Wellness visit today, with problems stable, except as noted.   SEPARATE EVALUATION FOLLOWS--EACH PROBLEM HERE IS NEW, NOT RESPONDING TO TREATMENT, OR POSES SIGNIFICANT RISK TO THE PATIENT'S HEALTH: HISTORY OF THE PRESENT ILLNESS: Pt states few mos of slight heartburn in the chest, but no assoc weight loss PAST MEDICAL HISTORY reviewed and up to date today REVIEW OF SYSTEMS: Denies dysphagia and numbness PHYSICAL EXAMINATION: VITAL SIGNS:  See vs page GENERAL: no distress ABDOMEN: abdomen is soft, nontender.  no hepatosplenomegaly.  not distended.  no hernia LAB/XRAY RESULTS: Lab Results  Component Value Date   WBC 5.3 03/20/2012   HGB 12.3 03/20/2012   HCT 36.6 03/20/2012   PLT 204.0 03/20/2012   GLUCOSE 98 03/20/2012   CHOL 174 03/20/2012   TRIG 212.0* 03/20/2012   HDL 34.70* 03/20/2012   LDLDIRECT 112.2 03/20/2012   LDLCALC 62 02/19/2010   ALT 26 03/20/2012   AST 29 03/20/2012   NA 141 03/20/2012   K 4.1 03/20/2012   CL 106 03/20/2012   CREATININE 1.0 03/20/2012   BUN 12 03/20/2012   CO2 27 03/20/2012   TSH 6.81* 03/20/2012   HGBA1C 5.9 03/20/2012   MICROALBUR 0.6 12/18/2009  IMPRESSION: Dyslipidemia, needs increased rx. Hypothyroidism, new.  Mild Genella Rife, with sxs despite prevacid PLAN: See instruction page

## 2012-03-25 NOTE — Patient Instructions (Addendum)
Let's recheck the thyroid blood test in 1 year.  Please call if heartburn persists, and we can either raise this medication, or change to a stronger one. please consider these measures for your health:  minimize alcohol.  do not use tobacco products.  have a colonoscopy at least every 10 years from age 61.  Women should have an annual mammogram from age 55.  keep firearms safely stored.  always use seat belts.  have working smoke alarms in your home.  see an eye doctor and dentist regularly.  never drive under the influence of alcohol or drugs (including prescription drugs).   please let me know what your wishes would be, if artificial life support measures should become necessary.  it is critically important to prevent falling down (keep floor areas well-lit, dry, and free of loose objects.  If you have a cane, walker, or wheelchair, you should use it, even for short trips around the house.  Also, try not to rush).  i have sent a prescription to your pharmacy, for an additional medication for the cholesterol.

## 2012-03-30 DIAGNOSIS — R079 Chest pain, unspecified: Secondary | ICD-10-CM

## 2012-03-30 HISTORY — DX: Chest pain, unspecified: R07.9

## 2012-04-05 ENCOUNTER — Inpatient Hospital Stay (HOSPITAL_COMMUNITY)
Admission: EM | Admit: 2012-04-05 | Discharge: 2012-04-06 | DRG: 143 | Disposition: A | Discharge status: Home / Self Care | Payer: BC Managed Care – PPO | Attending: Internal Medicine | Admitting: Internal Medicine

## 2012-04-05 ENCOUNTER — Encounter (HOSPITAL_COMMUNITY): Payer: Self-pay

## 2012-04-05 ENCOUNTER — Emergency Department (HOSPITAL_COMMUNITY): Payer: BC Managed Care – PPO

## 2012-04-05 DIAGNOSIS — R1319 Other dysphagia: Secondary | ICD-10-CM

## 2012-04-05 DIAGNOSIS — M5412 Radiculopathy, cervical region: Secondary | ICD-10-CM

## 2012-04-05 DIAGNOSIS — L84 Corns and callosities: Secondary | ICD-10-CM

## 2012-04-05 DIAGNOSIS — B029 Zoster without complications: Secondary | ICD-10-CM

## 2012-04-05 DIAGNOSIS — E876 Hypokalemia: Secondary | ICD-10-CM | POA: Diagnosis present

## 2012-04-05 DIAGNOSIS — Z860100 Personal history of colon polyps, unspecified: Secondary | ICD-10-CM

## 2012-04-05 DIAGNOSIS — I498 Other specified cardiac arrhythmias: Secondary | ICD-10-CM | POA: Diagnosis present

## 2012-04-05 DIAGNOSIS — E119 Type 2 diabetes mellitus without complications: Secondary | ICD-10-CM | POA: Diagnosis present

## 2012-04-05 DIAGNOSIS — J309 Allergic rhinitis, unspecified: Secondary | ICD-10-CM

## 2012-04-05 DIAGNOSIS — K449 Diaphragmatic hernia without obstruction or gangrene: Secondary | ICD-10-CM

## 2012-04-05 DIAGNOSIS — M810 Age-related osteoporosis without current pathological fracture: Secondary | ICD-10-CM | POA: Diagnosis present

## 2012-04-05 DIAGNOSIS — J069 Acute upper respiratory infection, unspecified: Secondary | ICD-10-CM

## 2012-04-05 DIAGNOSIS — H6120 Impacted cerumen, unspecified ear: Secondary | ICD-10-CM

## 2012-04-05 DIAGNOSIS — E7800 Pure hypercholesterolemia, unspecified: Secondary | ICD-10-CM | POA: Diagnosis present

## 2012-04-05 DIAGNOSIS — D1720 Benign lipomatous neoplasm of skin and subcutaneous tissue of unspecified limb: Secondary | ICD-10-CM

## 2012-04-05 DIAGNOSIS — Z0000 Encounter for general adult medical examination without abnormal findings: Secondary | ICD-10-CM

## 2012-04-05 DIAGNOSIS — D509 Iron deficiency anemia, unspecified: Secondary | ICD-10-CM

## 2012-04-05 DIAGNOSIS — Z79899 Other long term (current) drug therapy: Secondary | ICD-10-CM

## 2012-04-05 DIAGNOSIS — R079 Chest pain, unspecified: Principal | ICD-10-CM | POA: Diagnosis present

## 2012-04-05 DIAGNOSIS — K219 Gastro-esophageal reflux disease without esophagitis: Secondary | ICD-10-CM

## 2012-04-05 DIAGNOSIS — H60399 Other infective otitis externa, unspecified ear: Secondary | ICD-10-CM

## 2012-04-05 DIAGNOSIS — K222 Esophageal obstruction: Secondary | ICD-10-CM

## 2012-04-05 DIAGNOSIS — S59909A Unspecified injury of unspecified elbow, initial encounter: Secondary | ICD-10-CM

## 2012-04-05 DIAGNOSIS — IMO0001 Reserved for inherently not codable concepts without codable children: Secondary | ICD-10-CM

## 2012-04-05 DIAGNOSIS — S6990XA Unspecified injury of unspecified wrist, hand and finger(s), initial encounter: Secondary | ICD-10-CM

## 2012-04-05 DIAGNOSIS — I10 Essential (primary) hypertension: Secondary | ICD-10-CM | POA: Diagnosis present

## 2012-04-05 LAB — CBC WITH DIFFERENTIAL/PLATELET
Basophils Absolute: 0 10*3/uL (ref 0.0–0.1)
Eosinophils Relative: 2 % (ref 0–5)
HCT: 35 % — ABNORMAL LOW (ref 36.0–46.0)
Lymphocytes Relative: 59 % — ABNORMAL HIGH (ref 12–46)
Lymphs Abs: 2.7 10*3/uL (ref 0.7–4.0)
MCV: 85.8 fL (ref 78.0–100.0)
Monocytes Absolute: 0.2 10*3/uL (ref 0.1–1.0)
Neutro Abs: 1.6 10*3/uL — ABNORMAL LOW (ref 1.7–7.7)
Platelets: 192 10*3/uL (ref 150–400)
RBC: 4.08 MIL/uL (ref 3.87–5.11)
RDW: 13.6 % (ref 11.5–15.5)
WBC: 4.6 10*3/uL (ref 4.0–10.5)

## 2012-04-05 LAB — COMPREHENSIVE METABOLIC PANEL
ALT: 23 U/L (ref 0–35)
AST: 21 U/L (ref 0–37)
Alkaline Phosphatase: 54 U/L (ref 39–117)
Calcium: 9.3 mg/dL (ref 8.4–10.5)
Potassium: 3.9 mEq/L (ref 3.5–5.1)
Sodium: 137 mEq/L (ref 135–145)
Total Protein: 7 g/dL (ref 6.0–8.3)

## 2012-04-05 LAB — PROTIME-INR: Prothrombin Time: 13.8 seconds (ref 11.6–15.2)

## 2012-04-05 LAB — CBC
HCT: 33.2 % — ABNORMAL LOW (ref 36.0–46.0)
Hemoglobin: 11.5 g/dL — ABNORMAL LOW (ref 12.0–15.0)
MCV: 84.7 fL (ref 78.0–100.0)
RDW: 13.9 % (ref 11.5–15.5)
WBC: 5.8 10*3/uL (ref 4.0–10.5)

## 2012-04-05 LAB — CREATININE, SERUM: GFR calc Af Amer: 85 mL/min — ABNORMAL LOW (ref >90)

## 2012-04-05 LAB — CARDIAC PANEL(CRET KIN+CKTOT+MB+TROPI)
CK, MB: 1.1 ng/mL (ref 0.3–4.0)
Relative Index: 0.9 (ref 0.0–2.5)

## 2012-04-05 LAB — APTT: aPTT: 33 seconds (ref 24–37)

## 2012-04-05 MED ORDER — NITROGLYCERIN 0.4 MG SL SUBL
0.4000 mg | SUBLINGUAL_TABLET | SUBLINGUAL | Status: DC | PRN
  Administered 2012-04-05: 0.4 mg via SUBLINGUAL
  Filled 2012-04-05: qty 25

## 2012-04-05 MED ORDER — ASPIRIN 81 MG PO CHEW
81.0000 mg | CHEWABLE_TABLET | Freq: Every day | ORAL | Status: DC
  Administered 2012-04-05 – 2012-04-06 (×2): 81 mg via ORAL
  Filled 2012-04-05 (×2): qty 1

## 2012-04-05 MED ORDER — INSULIN ASPART 100 UNIT/ML ~~LOC~~ SOLN: 0.0000 [IU] | SUBCUTANEOUS | Status: DC

## 2012-04-05 MED ORDER — DOCUSATE SODIUM 100 MG PO CAPS
100.0000 mg | ORAL_CAPSULE | Freq: Two times a day (BID) | ORAL | Status: DC
  Administered 2012-04-06: 100 mg via ORAL
  Filled 2012-04-05 (×3): qty 1

## 2012-04-05 MED ORDER — ONDANSETRON HCL 4 MG/2ML IJ SOLN: 4.0000 mg | Freq: Three times a day (TID) | INTRAMUSCULAR | Status: DC | PRN

## 2012-04-05 MED ORDER — ONDANSETRON 4 MG PO TBDP
4.0000 mg | ORAL_TABLET | Freq: Once | ORAL | Status: AC
  Administered 2012-04-05: 4 mg via ORAL
  Filled 2012-04-05: qty 1

## 2012-04-05 MED ORDER — ACETAMINOPHEN 325 MG PO TABS: 650.0000 mg | ORAL_TABLET | Freq: Four times a day (QID) | ORAL | Status: DC | PRN

## 2012-04-05 MED ORDER — MORPHINE SULFATE 4 MG/ML IJ SOLN
4.0000 mg | Freq: Once | INTRAMUSCULAR | Status: AC
  Administered 2012-04-05: 4 mg via INTRAVENOUS
  Filled 2012-04-05: qty 1

## 2012-04-05 MED ORDER — SODIUM CHLORIDE 0.9 % IV SOLN: 250.0000 mL | INTRAVENOUS | Status: DC | PRN

## 2012-04-05 MED ORDER — HYDROCODONE-ACETAMINOPHEN 5-325 MG PO TABS: 1.0000 | ORAL_TABLET | ORAL | Status: DC | PRN

## 2012-04-05 MED ORDER — ONDANSETRON HCL 4 MG PO TABS: 4.0000 mg | ORAL_TABLET | Freq: Four times a day (QID) | ORAL | Status: DC | PRN

## 2012-04-05 MED ORDER — GUAIFENESIN-DM 100-10 MG/5ML PO SYRP: 5.0000 mL | ORAL_SOLUTION | ORAL | Status: DC | PRN

## 2012-04-05 MED ORDER — SODIUM CHLORIDE 0.9 % IV SOLN: INTRAVENOUS | Status: DC

## 2012-04-05 MED ORDER — PANTOPRAZOLE SODIUM 40 MG PO TBEC
40.0000 mg | DELAYED_RELEASE_TABLET | Freq: Every day | ORAL | Status: DC
  Administered 2012-04-06: 40 mg via ORAL
  Filled 2012-04-05: qty 1

## 2012-04-05 MED ORDER — EZETIMIBE 10 MG PO TABS
10.0000 mg | ORAL_TABLET | Freq: Every day | ORAL | Status: DC
  Administered 2012-04-05 – 2012-04-06 (×2): 10 mg via ORAL
  Filled 2012-04-05 (×2): qty 1

## 2012-04-05 MED ORDER — MORPHINE SULFATE 2 MG/ML IJ SOLN: 2.0000 mg | INTRAMUSCULAR | Status: DC | PRN

## 2012-04-05 MED ORDER — ASPIRIN 81 MG PO CHEW
324.0000 mg | CHEWABLE_TABLET | Freq: Once | ORAL | Status: AC
  Administered 2012-04-05: 324 mg via ORAL
  Filled 2012-04-05: qty 4

## 2012-04-05 MED ORDER — SODIUM CHLORIDE 0.9 % IJ SOLN: 3.0000 mL | Freq: Two times a day (BID) | INTRAMUSCULAR | Status: DC

## 2012-04-05 MED ORDER — NITROGLYCERIN 0.4 MG SL SUBL: 0.4000 mg | SUBLINGUAL_TABLET | SUBLINGUAL | Status: DC | PRN

## 2012-04-05 MED ORDER — SODIUM CHLORIDE 0.9 % IJ SOLN
3.0000 mL | Freq: Two times a day (BID) | INTRAMUSCULAR | Status: DC
  Administered 2012-04-05 – 2012-04-06 (×2): 3 mL via INTRAVENOUS

## 2012-04-05 MED ORDER — ALBUTEROL SULFATE (5 MG/ML) 0.5% IN NEBU: 2.5000 mg | INHALATION_SOLUTION | RESPIRATORY_TRACT | Status: DC | PRN

## 2012-04-05 MED ORDER — ACETAMINOPHEN 650 MG RE SUPP: 650.0000 mg | Freq: Four times a day (QID) | RECTAL | Status: DC | PRN

## 2012-04-05 MED ORDER — ONDANSETRON HCL 4 MG/2ML IJ SOLN: 4.0000 mg | Freq: Four times a day (QID) | INTRAMUSCULAR | Status: DC | PRN

## 2012-04-05 MED ORDER — ENOXAPARIN SODIUM 40 MG/0.4ML ~~LOC~~ SOLN
40.0000 mg | SUBCUTANEOUS | Status: DC
  Administered 2012-04-05: 40 mg via SUBCUTANEOUS
  Filled 2012-04-05 (×2): qty 0.4

## 2012-04-05 MED ORDER — SODIUM CHLORIDE 0.9 % IJ SOLN: 3.0000 mL | INTRAMUSCULAR | Status: DC | PRN

## 2012-04-05 MED ORDER — HYDROMORPHONE HCL PF 1 MG/ML IJ SOLN: 1.0000 mg | INTRAMUSCULAR | Status: DC | PRN

## 2012-04-05 MED ORDER — ALUM & MAG HYDROXIDE-SIMETH 200-200-20 MG/5ML PO SUSP: 30.0000 mL | Freq: Four times a day (QID) | ORAL | Status: DC | PRN

## 2012-04-05 NOTE — ED Provider Notes (Signed)
History     CSN: [id number]  Arrival date & time 04/05/12  1210   First MD Initiated Contact with Patient 04/05/12 1324      Chief Complaint  Patient presents with  . Chest Pain    (Consider location/radiation/quality/duration/timing/severity/associated sxs/prior treatment) Patient is a 61 y.o. female presenting with chest pain. The history is provided by the patient and the spouse.  Chest Pain The chest pain began 6 - 12 hours ago. Chest pain occurs constantly. The chest pain is unchanged. At its most intense, the pain is at 7/10. The pain is currently at 7/10. The pain radiates to the left shoulder. Pertinent negatives for primary symptoms include no fever, no syncope, no shortness of breath, no cough, no palpitations, no nausea and no vomiting.  Pertinent negatives for associated symptoms include no diaphoresis. She tried nothing for the symptoms.  Pertinent negatives for family medical history include: no CAD in family.    61 year old female in no acute distress complaining of left-sided pain to chest described as pressure-like, constant,  7/10 onset at 9 AM this morning. Pain radiates to left scapula. Denies nausea, vomiting, shortness of breath or diaphoresis. Patient is diabetic with high cholesterol has remote history of hypertension diagnosis not currently on medication. Patient had a similar episode in the past from not taking her reflux medication regularly.   Past Medical History  Diagnosis Date  . COLONIC POLYPS, HX OF 04/24/2007  . HYPERTENSION 04/24/2007  . OSTEOPOROSIS 04/24/2007  . GERD 09/21/2010  . DM 03/18/2008  . HYPERCHOLESTEROLEMIA 07/03/2009  . ANEMIA-IRON DEFICIENCY 04/24/2007  . ALLERGIC RHINITIS CAUSE UNSPECIFIED 01/02/2009  . ESOPHAGEAL STRICTURE 04/15/2007  . HIATAL HERNIA 04/15/2007  . CERVICAL RADICULOPATHY, LEFT 02/01/2010  . 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 Tubal ligation   . Esophagogastroduodenoscopy 04/15/2007     Family History  Problem Relation Age of Onset  . Cancer Neg Hx     No FH of Colon Cancer    History  Substance Use Topics  . Smoking status: Never Smoker   . Smokeless tobacco: Not on file  . Alcohol Use: No    OB History    Grav Para Term Preterm Abortions TAB SAB Ect Mult Living                  Review of Systems  Constitutional: Negative for fever and diaphoresis.  Respiratory: Negative for cough and shortness of breath.   Cardiovascular: Positive for chest pain. Negative for palpitations and syncope.  Gastrointestinal: Negative for nausea and vomiting.  All other systems reviewed and are negative.    Allergies  Atorvastatin; Rosuvastatin; Sulfamethoxazole; and Sulfonamide derivatives  Home Medications   Current Outpatient Rx  Name Route Sig Dispense Refill  . ASPIRIN 81 MG PO TABS Oral Take 81 mg by mouth daily.     Marland Kitchen CALCIUM 500-125 MG-UNIT PO TABS Oral Take 1 tablet by mouth daily.      Marland Kitchen VITAMIN D 1000 UNITS PO TABS Oral Take 1,000 Units by mouth daily.    . COLESTIPOL HCL 1 G PO TABS Oral Take 2 tablets (2 g total) by mouth daily. 60 tablet 11  . OMEGA-3 FATTY ACIDS 1000 MG PO CAPS Oral Take 1 capsule by mouth every other day.      Marland Kitchen LANSOPRAZOLE 30 MG PO CPDR  TAKE ONE CAPSULE BY MOUTH EVERY DAY 1 HOUR BEFORE MEALS 31 capsule 8  . METFORMIN HCL ER 500 MG  PO TB24 Oral Take 1 tablet (500 mg total) by mouth daily. 120 tablet 10  . ZETIA 10 MG PO TABS  TAKE 1 TABLET BY MOUTH EVERY DAY 30 tablet 4    BP 115/60  Temp 98.7 F (37.1 C) (Oral)  Resp 16  SpO2 100%  Physical Exam  Nursing note and vitals reviewed. Constitutional: She is oriented to person, place, and time. She appears well-developed and well-nourished. No distress.  HENT:  Head: Normocephalic and atraumatic.  Eyes: Conjunctivae and EOM are normal.  Cardiovascular: Normal rate, regular rhythm and normal heart sounds.   Pulmonary/Chest: Effort normal and breath sounds normal. No respiratory  distress. She has no wheezes. She has no rales. She exhibits no tenderness.  Abdominal: Soft. Bowel sounds are normal.  Musculoskeletal: Normal range of motion.  Neurological: She is alert and oriented to person, place, and time.  Psychiatric: She has a normal mood and affect.    ED Course  Procedures (including critical care time)  Labs Reviewed  CBC WITH DIFFERENTIAL - Abnormal; Notable for the following:    Hemoglobin 11.9 (*)     HCT 35.0 (*)     Neutrophils Relative 35 (*)     Neutro Abs 1.6 (*)     Lymphocytes Relative 59 (*)     All other components within normal limits  COMPREHENSIVE METABOLIC PANEL - Abnormal; Notable for the following:    Albumin 3.2 (*)     GFR calc non Af Amer 60 (*)     GFR calc Af Amer 70 (*)     All other components within normal limits  PROTIME-INR  APTT  TROPONIN I   Dg Chest 2 View  04/05/2012  *RADIOLOGY REPORT*  Clinical Data: Chest pain.  Hypertension.  CHEST - 2 VIEW  Comparison: 04/05/2012.  Findings: Normal sized heart.  Clear lungs.  Mild scoliosis.  IMPRESSION: No acute abnormality.  Original Report Authenticated By: Darrol Angel, M.D.   Dg Chest Port 1 View  04/05/2012  *RADIOLOGY REPORT*  Clinical Data: Mild chest pain beginning this morning, history of hypertension  PORTABLE CHEST - 1 VIEW  Comparison: 12/09/2009  Findings:  Possible increase in the size of the cardiac silhouette.  Unchanged mediastinal contours.  No focal airspace opacities.  There is mild eventration of the right hemidiaphragm.  No pleural effusion or pneumothorax.  No acute osseous abnormalities.  IMPRESSION: Apparent increase in the size of the cardiac silhouette may be artifactual secondary to slightly decreased lung volumes and AP projection.  Further evaluation with cardiac echo may be performed as clinically indicated.  Original Report Authenticated By: Waynard Reeds, M.D.    Date: 04/05/2012  Rate: 53  Rhythm: normal sinus rhythm  QRS Axis: normal   Intervals: normal  ST/T Wave abnormalities: normal  Conduction Disutrbances:none  Narrative Interpretation: PVCs  Old EKG Reviewed: none available    1. 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 EKG shows PVCs with no ST/T wave changes. 1st troponin negative, Pain controlled to 2/10 with NTG and morphine at 1500. Mildly enlarged cardiac silhouette on portable will order PA. PA CXR shows no abnormalities. Pt remains pain free at 18:30 Second EKG shows no ST/Twave changes. Will admit to telemetry for further testing based on risk factors.        Wynetta Emery, PA-C 04/05/12  1850 

## 2012-04-05 NOTE — ED Notes (Signed)
Pt in from home with c/o chest pain states onset this am states radiates to the back states pressure/ denies n/v denies sob denies cardiac hx

## 2012-04-05 NOTE — H&P (Signed)
PCP:  Romero Belling, MD  GI: [facility]   Chief Complaint:   Chest pain  HPI: Victoria Gates is a 61 y.o. female   has a past medical history of COLONIC POLYPS, HX OF (04/24/2007); HYPERTENSION (04/24/2007); OSTEOPOROSIS (04/24/2007); GERD (09/21/2010); DM (03/18/2008); HYPERCHOLESTEROLEMIA (07/03/2009); ANEMIA-IRON DEFICIENCY (04/24/2007); ALLERGIC RHINITIS CAUSE UNSPECIFIED (01/02/2009); ESOPHAGEAL STRICTURE (04/15/2007); HIATAL HERNIA (04/15/2007); CERVICAL RADICULOPATHY, LEFT (02/01/2010); and HERPES ZOSTER (11/05/2010).   Presented with  Chest pressure since 9 am this morning. Radiating to the back and shoulder. The pain was non exertional. Non positional. Did not think it got worse with deep breathes.  It was resolved with combination of nitroglycerine and morphine. She took 2 ASA at home and got two more in route. No hx of CAD. She had a stress test years ago she not sure why but it was negative she thinks it was done by [facility] cardiology.   She is not very active but when she walks up stairs she has no symptoms. No FH of CAD.  Denies any N/V/D no fever. No SOB.   Review of Systems:    Pertinent positives include:  chest pain,   Constitutional:  No weight loss, night sweats, Fevers, chills, fatigue, weight loss  HEENT:  No headaches, Difficulty swallowing,Tooth/dental problems,Sore throat,  No sneezing, itching, ear ache, nasal congestion, post nasal drip,  Cardio-vascular:  NoOrthopnea, PND, anasarca, dizziness, palpitations.no Bilateral lower extremity swelling  GI:  No heartburn, indigestion, abdominal pain, nausea, vomiting, diarrhea, change in bowel habits, loss of appetite, melena, blood in stool, hematemesis Resp:  no shortness of breath at rest. No dyspnea on exertion, No excess mucus, no productive cough, No non-productive cough, No coughing up of blood.No change in color of mucus.No wheezing. Skin:  no rash or lesions. No jaundice GU:  no dysuria, change in color of urine,  no urgency or frequency. No straining to urinate.  No flank pain.  Musculoskeletal:  No joint pain or no joint swelling. No decreased range of motion. No back pain.  Psych:  No change in mood or affect. No depression or anxiety. No memory loss.  Neuro: no localizing neurological complaints, no tingling, no weakness, no double vision, no gait abnormality, no slurred speech, no confusion  Otherwise ROS are negative except for above, 10 systems were reviewed  Past Medical History: Past Medical History  Diagnosis Date  . COLONIC POLYPS, HX OF 04/24/2007  . HYPERTENSION 04/24/2007  . OSTEOPOROSIS 04/24/2007  . GERD 09/21/2010  . DM 03/18/2008  . HYPERCHOLESTEROLEMIA 07/03/2009  . ANEMIA-IRON DEFICIENCY 04/24/2007  . ALLERGIC RHINITIS CAUSE UNSPECIFIED 01/02/2009  . ESOPHAGEAL STRICTURE 04/15/2007  . HIATAL HERNIA 04/15/2007  . CERVICAL RADICULOPATHY, LEFT 02/01/2010  . 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 Tubal ligation   . Esophagogastroduodenoscopy 04/15/2007     Medications: Prior to Admission medications   Medication Sig Start Date End Date Taking? Authorizing Provider  aspirin 81 MG tablet Take 81 mg by mouth daily.    Yes Historical Provider, MD  Calcium 500-125 MG-UNIT TABS Take 1 tablet by mouth daily.     Yes Historical Provider, MD  cholecalciferol (VITAMIN D) 1000 UNITS tablet Take 1,000 Units by mouth daily.   Yes Historical Provider, MD  colestipol (COLESTID) 1 G tablet Take 2 tablets (2 g total) by mouth daily. 03/25/12 03/25/13 Yes Romero Belling, MD  fish oil-omega-3 fatty acids 1000 MG capsule Take 1 capsule by mouth every other day.  Yes Historical Provider, MD  lansoprazole (PREVACID) 30 MG capsule TAKE ONE CAPSULE BY MOUTH EVERY DAY 1 HOUR BEFORE MEALS 09/07/11  Yes Mardella Layman, MD  metFORMIN (GLUCOPHAGE-XR) 500 MG 24 hr tablet Take 1 tablet (500 mg total) by mouth daily. 04/30/11  Yes Romero Belling, MD  ZETIA 10 MG tablet TAKE 1 TABLET BY  MOUTH EVERY DAY 02/08/12  Yes Romero Belling, MD    Allergies:   Allergies  Allergen Reactions  . Atorvastatin     REACTION: myalgias  . Rosuvastatin     REACTION: myalgias  . Sulfamethoxazole     REACTION: unspecified  . Sulfonamide Derivatives     Social History:  Ambulatory independently Lives at  Home with family   reports that she has never smoked. She does not have any smokeless tobacco history on file. She reports that she does not drink alcohol or use illicit drugs.   Family History: family history includes Brain cancer in her mother; Diabetes in her brother and sister; Heart disease in her brother, father, mother, and sister; and Lung cancer in her father.  There is no history of Cancer.    Physical Exam: Patient Vitals for the past 24 hrs:  BP Temp Temp src Pulse Resp SpO2  04/05/12 1900 120/53 mmHg - - 44  14  -  04/05/12 1844 143/87 mmHg - - 61  17  -  04/05/12 1630 113/54 mmHg - - 41  15  -  04/05/12 1600 114/55 mmHg - - 45  13  -  04/05/12 1430 98/58 mmHg - - 56  17  -  04/05/12 1400 98/64 mmHg - - - 16  -  04/05/12 1330 135/114 mmHg - - 50  14  -  04/05/12 1221 115/60 mmHg 98.7 F (37.1 C) Oral - 16  100 %    1. General:  in No Acute distress 2. Psychological: Alert and  Oriented 3. Head/ENT:   Moist  Mucous Membranes                          Head Non traumatic, neck supple                          Normal Dentition 4. SKIN: normal Skin turgor,  Skin clean Dry and intact no rash 5. Heart: slow but Regular rate and rhythm no Murmur, Rub or gallop 6. Lungs: Clear to auscultation bilaterally, no wheezes or crackles   7. Abdomen: Soft, non-tender, Non distended 8. Lower extremities: no clubbing, cyanosis, or edema 9. Neurologically Grossly intact, moving all 4 extremities equally strength 5/5 in all 4 ext. 10. MSK: Normal range of motion  body mass index is unknown because there is no height or weight on file.   Labs on Admission:   [facility] 04/05/12  1331  NA 137  K 3.9  CL 104  CO2 25  GLUCOSE 94  BUN 14  CREATININE 0.99  CALCIUM 9.3  MG --  PHOS --    Basename 04/05/12 1331  AST 21  ALT 23  ALKPHOS 54  BILITOT 0.3  PROT 7.0  ALBUMIN 3.2*   No results found for this basename: LIPASE:2,AMYLASE:2 in the last 72 hours  Basename 04/05/12 1331  WBC 4.6  NEUTROABS 1.6*  HGB 11.9*  HCT 35.0*  MCV 85.8  PLT 192    Basename 04/05/12 1701 04/05/12 1331  CKTOTAL -- --  CKMB -- --  CKMBINDEX -- --  TROPONINI <0.30 <0.30   No results found for this basename: TSH,T4TOTAL,FREET3,T3FREE,THYROIDAB in the last 72 hours No results found for this basename: VITAMINB12:2,FOLATE:2,FERRITIN:2,TIBC:2,IRON:2,RETICCTPCT:2 in the last 72 hours Lab Results  Component Value Date   HGBA1C 5.9 03/20/2012    The CrCl is unknown because both a height and weight (above a minimum accepted value) are required for this calculation. ABG No results found for this basename: phart, pco2, po2, hco3, tco2, acidbasedef, o2sat     No results found for this basename: DDIMER     Other results:  I have pearsonaly reviewed this: ECG REPORT  Rate: 49  Rhythm: sinus bradycardia frequent PVC ST&T Change: no ischemia    Radiological Exams on Admission: Dg Chest 2 View  04/05/2012  *RADIOLOGY REPORT*  Clinical Data: Chest pain.  Hypertension.  CHEST - 2 VIEW  Comparison: 04/05/2012.  Findings: Normal sized heart.  Clear lungs.  Mild scoliosis.  IMPRESSION: No acute abnormality.  Original Report Authenticated By: Darrol Angel, M.D.   Dg Chest Port 1 View  04/05/2012  *RADIOLOGY REPORT*  Clinical Data: Mild chest pain beginning this morning, history of hypertension  PORTABLE CHEST - 1 VIEW  Comparison: 12/09/2009  Findings:  Possible increase in the size of the cardiac silhouette.  Unchanged mediastinal contours.  No focal airspace opacities.  There is mild eventration of the right hemidiaphragm.  No pleural effusion or pneumothorax.  No acute osseous  abnormalities.  IMPRESSION: Apparent increase in the size of the cardiac silhouette may be artifactual secondary to slightly decreased lung volumes and AP projection.  Further evaluation with cardiac echo may be performed as clinically indicated.  Original Report Authenticated By: Waynard Reeds, M.D.    Assessment/Plan  61 yo W hx of DM and HL here with chest pressure and bradycardia  Present on Admission:  .Unspecified Chest Pain - - given risk factors will admit, monitor on telemetry, cycle cardiac enzymes, obtain serial ECG. Further risk stratify with lipid panel, hgA1C, obtain TSH. Make sure patient is on Aspirin. Further treatment based on the currently pending results.  Given possible CM on CXR will order echo in am. Would recommend cardiology consult in am and possible stress test.  .DM- SSI hold metformin .HYPERCHOLESTEROLEMIA - continue zetia  .GERD - protonix Bradycardia - obtain TSH, echo, no beta blockers, patient is asymptomatic, monitor on tele   Prophylaxis:  Lovenox, Protonix  CODE STATUS: FULL CODE  Other plan as per orders.  I have spent a total of 55 min on this admission  Maguadalupe Lata 04/05/2012, 7:15 PM

## 2012-04-05 NOTE — ED Notes (Signed)
Occasional bigeminy

## 2012-04-06 ENCOUNTER — Encounter (HOSPITAL_COMMUNITY): Payer: Self-pay | Admitting: Physician Assistant

## 2012-04-06 DIAGNOSIS — R079 Chest pain, unspecified: Principal | ICD-10-CM

## 2012-04-06 LAB — LIPID PANEL
Cholesterol: 170 mg/dL (ref 0–200)
HDL: 24 mg/dL — ABNORMAL LOW (ref >39)
Total CHOL/HDL Ratio: 7.1 RATIO
Triglycerides: 268 mg/dL — ABNORMAL HIGH (ref <150)

## 2012-04-06 LAB — COMPREHENSIVE METABOLIC PANEL
ALT: 20 U/L (ref 0–35)
AST: 17 U/L (ref 0–37)
Calcium: 8.8 mg/dL (ref 8.4–10.5)
GFR calc Af Amer: 81 mL/min — ABNORMAL LOW (ref >90)
Sodium: 139 mEq/L (ref 135–145)
Total Protein: 6.5 g/dL (ref 6.0–8.3)

## 2012-04-06 LAB — GLUCOSE, CAPILLARY
Glucose-Capillary: 113 mg/dL — ABNORMAL HIGH (ref 70–99)
Glucose-Capillary: 90 mg/dL (ref 70–99)
Glucose-Capillary: 97 mg/dL (ref 70–99)
Glucose-Capillary: 97 mg/dL (ref 70–99)

## 2012-04-06 LAB — CBC
HCT: 32.4 % — ABNORMAL LOW (ref 36.0–46.0)
Hemoglobin: 11.1 g/dL — ABNORMAL LOW (ref 12.0–15.0)
MCH: 29.1 pg (ref 26.0–34.0)
MCHC: 34.3 g/dL (ref 30.0–36.0)

## 2012-04-06 LAB — CARDIAC PANEL(CRET KIN+CKTOT+MB+TROPI)
CK, MB: 0.9 ng/mL (ref 0.3–4.0)
Total CK: 107 U/L (ref 7–177)
Total CK: 102 U/L (ref 7–177)

## 2012-04-06 LAB — HEMOGLOBIN A1C: Hgb A1c MFr Bld: 6.2 % — ABNORMAL HIGH (ref <5.7)

## 2012-04-06 LAB — TSH: TSH: 4.299 u[IU]/mL (ref 0.350–4.500)

## 2012-04-06 LAB — MAGNESIUM: Magnesium: 1.9 mg/dL (ref 1.5–2.5)

## 2012-04-06 LAB — PHOSPHORUS: Phosphorus: 4.5 mg/dL (ref 2.3–4.6)

## 2012-04-06 MED ORDER — POTASSIUM CHLORIDE CRYS ER 20 MEQ PO TBCR
40.0000 meq | EXTENDED_RELEASE_TABLET | Freq: Once | ORAL | Status: AC
  Administered 2012-04-06: 40 meq via ORAL
  Filled 2012-04-06: qty 2

## 2012-04-06 NOTE — Care Management Note (Signed)
    Page 1 of 1   04/06/2012     4:42:21 PM   CARE MANAGEMENT NOTE 04/06/2012  Patient:  Victoria Gates, Victoria Gates   Account Number:  [id number]  Date Initiated:  04/06/2012  Documentation initiated by:  Lanier Clam  Subjective/Objective Assessment:   ADMITTED W/CHEST PAIN     Action/Plan:   FROM HOME   Anticipated DC Date:  04/06/2012   Anticipated DC Plan:  HOME/SELF CARE      DC Planning Services  CM consult      Choice offered to / List presented to:             Status of service:  Completed, signed off Medicare Important Message given?   (If response is "NO", the following Medicare IM given date fields will be blank) Date Medicare IM given:   Date Additional Medicare IM given:    Discharge Disposition:  HOME/SELF CARE  Per UR Regulation:  Reviewed for med. necessity/level of care/duration of stay  If discussed at Long Length of Stay Meetings, dates discussed:    Comments:  04/06/12 Adrean Findlay RN,BSN NCM, [phone]

## 2012-04-06 NOTE — Progress Notes (Signed)
Pt's HR dropped to 33 (non-sustaining) while sleeping. Pt's HR sustaining in 40's while sleeping. VS stable. Will continue to monitor.   Potassium level of 3.3 called to MD, orders to replete.   Earnest Conroy. Clelia Croft, RN

## 2012-04-06 NOTE — Discharge Summary (Signed)
Physician Discharge Summary  Victoria Gates MRN: [id number] DOB/AGE: [birth year] 61 y.o.  PCP: Romero Belling, MD   Admit date: 04/05/2012 Discharge date: 04/06/2012  Discharge Diagnoses:     *Unspecified Chest Pain Active Problems:  DM  HYPERCHOLESTEROLEMIA  GERD   Medication List  As of 04/06/2012 11:58 AM   TAKE these medications         aspirin 81 MG tablet   Take 81 mg by mouth daily.      Calcium 500-125 MG-UNIT Tabs   Take 1 tablet by mouth daily.      cholecalciferol 1000 UNITS tablet   Commonly known as: VITAMIN D   Take 1,000 Units by mouth daily.      colestipol 1 G tablet   Commonly known as: COLESTID   Take 2 tablets (2 g total) by mouth daily.      fish oil-omega-3 fatty acids 1000 MG capsule   Take 1 capsule by mouth every other day.      lansoprazole 30 MG capsule   Commonly known as: PREVACID   TAKE ONE CAPSULE BY MOUTH EVERY DAY 1 HOUR BEFORE MEALS      metFORMIN 500 MG 24 hr tablet   Commonly known as: GLUCOPHAGE-XR   Take 1 tablet (500 mg total) by mouth daily.      ZETIA 10 MG tablet   Generic drug: ezetimibe   TAKE 1 TABLET BY MOUTH EVERY DAY            Discharge Condition: Stable   Disposition: Final discharge disposition not confirmed   Consults:  #1 cardiology,Peter Phillips Hay, MD   Significant Diagnostic Studies: Dg Chest 2 View  04/05/2012  *RADIOLOGY REPORT*  Clinical Data: Chest pain.  Hypertension.  CHEST - 2 VIEW  Comparison: 04/05/2012.  Findings: Normal sized heart.  Clear lungs.  Mild scoliosis.  IMPRESSION: No acute abnormality.  Original Report Authenticated By: Darrol Angel, M.D.   Dg Chest Port 1 View  04/05/2012  *RADIOLOGY REPORT*  Clinical Data: Mild chest pain beginning this morning, history of hypertension  PORTABLE CHEST - 1 VIEW  Comparison: 12/09/2009  Findings:  Possible increase in the size of the cardiac silhouette.  Unchanged mediastinal contours.  No focal airspace opacities.  There is mild  eventration of the right hemidiaphragm.  No pleural effusion or pneumothorax.  No acute osseous abnormalities.  IMPRESSION: Apparent increase in the size of the cardiac silhouette may be artifactual secondary to slightly decreased lung volumes and AP projection.  Further evaluation with cardiac echo may be performed as clinically indicated.  Original Report Authenticated By: Waynard Reeds, M.D.   2-D echo done results pending at this time   Microbiology: No results found for this or any previous visit (from the past 240 hour(s)).   Labs: Results for orders placed during the hospital encounter of 04/05/12 (from the past 48 hour(s))  CBC WITH DIFFERENTIAL     Status: Abnormal   Collection Time   04/05/12  1:31 PM      Component Value Range Comment   WBC 4.6  4.0 - 10.5 K/uL    RBC 4.08  3.87 - 5.11 MIL/uL    Hemoglobin 11.9 (*) 12.0 - 15.0 g/dL    HCT 46.9 (*) 62.9 - 46.0 %    MCV 85.8  78.0 - 100.0 fL    MCH 29.2  26.0 - 34.0 pg    MCHC 34.0  30.0 - 36.0 g/dL  RDW 13.6  11.5 - 15.5 %    Platelets 192  150 - 400 K/uL    Neutrophils Relative 35 (*) 43 - 77 %    Neutro Abs 1.6 (*) 1.7 - 7.7 K/uL    Lymphocytes Relative 59 (*) 12 - 46 %    Lymphs Abs 2.7  0.7 - 4.0 K/uL    Monocytes Relative 4  3 - 12 %    Monocytes Absolute 0.2  0.1 - 1.0 K/uL    Eosinophils Relative 2  0 - 5 %    Eosinophils Absolute 0.1  0.0 - 0.7 K/uL    Basophils Relative 0  0 - 1 %    Basophils Absolute 0.0  0.0 - 0.1 K/uL   PROTIME-INR     Status: Normal   Collection Time   04/05/12  1:31 PM      Component Value Range Comment   Prothrombin Time 13.8  11.6 - 15.2 seconds    INR 1.04  0.00 - 1.49   APTT     Status: Normal   Collection Time   04/05/12  1:31 PM      Component Value Range Comment   aPTT 33  24 - 37 seconds   COMPREHENSIVE METABOLIC PANEL     Status: Abnormal   Collection Time   04/05/12  1:31 PM      Component Value Range Comment   Sodium 137  135 - 145 mEq/L    Potassium 3.9  3.5 - 5.1  mEq/L    Chloride 104  96 - 112 mEq/L    CO2 25  19 - 32 mEq/L    Glucose, Bld 94  70 - 99 mg/dL    BUN 14  6 - 23 mg/dL    Creatinine, Ser 0.73  0.50 - 1.10 mg/dL    Calcium 9.3  8.4 - 71.0 mg/dL    Total Protein 7.0  6.0 - 8.3 g/dL    Albumin 3.2 (*) 3.5 - 5.2 g/dL    AST 21  0 - 37 U/L    ALT 23  0 - 35 U/L    Alkaline Phosphatase 54  39 - 117 U/L    Total Bilirubin 0.3  0.3 - 1.2 mg/dL    GFR calc non Af Amer 60 (*) >90 mL/min    GFR calc Af Amer 70 (*) >90 mL/min   TROPONIN I     Status: Normal   Collection Time   04/05/12  1:31 PM      Component Value Range Comment   Troponin I <0.30  <0.30 ng/mL   TROPONIN I     Status: Normal   Collection Time   04/05/12  5:01 PM      Component Value Range Comment   Troponin I <0.30  <0.30 ng/mL   CARDIAC PANEL(CRET KIN+CKTOT+MB+TROPI)     Status: Normal   Collection Time   04/05/12  8:54 PM      Component Value Range Comment   Total CK 124  7 - 177 U/L    CK, MB 1.1  0.3 - 4.0 ng/mL    Troponin I <0.30  <0.30 ng/mL    Relative Index 0.9  0.0 - 2.5   CBC     Status: Abnormal   Collection Time   04/05/12  8:54 PM      Component Value Range Comment   WBC 5.8  4.0 - 10.5 K/uL    RBC 3.92  3.87 - 5.11 MIL/uL  Hemoglobin 11.5 (*) 12.0 - 15.0 g/dL    HCT 16.1 (*) 09.6 - 46.0 %    MCV 84.7  78.0 - 100.0 fL    MCH 29.3  26.0 - 34.0 pg    MCHC 34.6  30.0 - 36.0 g/dL    RDW 04.5  40.9 - 81.1 %    Platelets 193  150 - 400 K/uL   CREATININE, SERUM     Status: Abnormal   Collection Time   04/05/12  8:54 PM      Component Value Range Comment   Creatinine, Ser 0.84  0.50 - 1.10 mg/dL    GFR calc non Af Amer 74 (*) >90 mL/min    GFR calc Af Amer 85 (*) >90 mL/min   HEMOGLOBIN A1C     Status: Abnormal   Collection Time   04/05/12  8:54 PM      Component Value Range Comment   Hemoglobin A1C 6.2 (*) <5.7 %    Mean Plasma Glucose 131 (*) <117 mg/dL   GLUCOSE, CAPILLARY     Status: Normal   Collection Time   04/05/12  9:32 PM      Component Value  Range Comment   Glucose-Capillary 86  70 - 99 mg/dL   GLUCOSE, CAPILLARY     Status: Abnormal   Collection Time   04/06/12 12:21 AM      Component Value Range Comment   Glucose-Capillary 113 (*) 70 - 99 mg/dL   MAGNESIUM     Status: Normal   Collection Time   04/06/12  2:29 AM      Component Value Range Comment   Magnesium 1.9  1.5 - 2.5 mg/dL   PHOSPHORUS     Status: Normal   Collection Time   04/06/12  2:29 AM      Component Value Range Comment   Phosphorus 4.5  2.3 - 4.6 mg/dL   TSH     Status: Normal   Collection Time   04/06/12  2:29 AM      Component Value Range Comment   TSH 4.299  0.350 - 4.500 uIU/mL   COMPREHENSIVE METABOLIC PANEL     Status: Abnormal   Collection Time   04/06/12  2:29 AM      Component Value Range Comment   Sodium 139  135 - 145 mEq/L    Potassium 3.3 (*) 3.5 - 5.1 mEq/L    Chloride 106  96 - 112 mEq/L    CO2 24  19 - 32 mEq/L    Glucose, Bld 105 (*) 70 - 99 mg/dL    BUN 14  6 - 23 mg/dL    Creatinine, Ser 9.14  0.50 - 1.10 mg/dL    Calcium 8.8  8.4 - 78.2 mg/dL    Total Protein 6.5  6.0 - 8.3 g/dL    Albumin 2.9 (*) 3.5 - 5.2 g/dL    AST 17  0 - 37 U/L    ALT 20  0 - 35 U/L    Alkaline Phosphatase 48  39 - 117 U/L    Total Bilirubin 0.2 (*) 0.3 - 1.2 mg/dL    GFR calc non Af Amer 69 (*) >90 mL/min    GFR calc Af Amer 81 (*) >90 mL/min   CBC     Status: Abnormal   Collection Time   04/06/12  2:29 AM      Component Value Range Comment   WBC 6.2  4.0 - 10.5 K/uL  RBC 3.81 (*) 3.87 - 5.11 MIL/uL    Hemoglobin 11.1 (*) 12.0 - 15.0 g/dL    HCT 96.0 (*) 45.4 - 46.0 %    MCV 85.0  78.0 - 100.0 fL    MCH 29.1  26.0 - 34.0 pg    MCHC 34.3  30.0 - 36.0 g/dL    RDW 09.8  11.9 - 14.7 %    Platelets 168  150 - 400 K/uL   CARDIAC PANEL(CRET KIN+CKTOT+MB+TROPI)     Status: Normal   Collection Time   04/06/12  2:29 AM      Component Value Range Comment   Total CK 107  7 - 177 U/L    CK, MB 0.9  0.3 - 4.0 ng/mL    Troponin I <0.30  <0.30 ng/mL    Relative  Index 0.8  0.0 - 2.5   LIPID PANEL     Status: Abnormal   Collection Time   04/06/12  2:29 AM      Component Value Range Comment   Cholesterol 170  0 - 200 mg/dL    Triglycerides 829 (*) <150 mg/dL    HDL 24 (*) >56 mg/dL    Total CHOL/HDL Ratio 7.1      VLDL 54 (*) 0 - 40 mg/dL    LDL Cholesterol 92  0 - 99 mg/dL   GLUCOSE, CAPILLARY     Status: Normal   Collection Time   04/06/12  4:02 AM      Component Value Range Comment   Glucose-Capillary 97  70 - 99 mg/dL   GLUCOSE, CAPILLARY     Status: Normal   Collection Time   04/06/12  7:23 AM      Component Value Range Comment   Glucose-Capillary 90  70 - 99 mg/dL   CARDIAC PANEL(CRET KIN+CKTOT+MB+TROPI)     Status: Normal   Collection Time   04/06/12  8:35 AM      Component Value Range Comment   Total CK 102  7 - 177 U/L    CK, MB 1.7  0.3 - 4.0 ng/mL    Troponin I <0.30  <0.30 ng/mL    Relative Index 1.7  0.0 - 2.5   GLUCOSE, CAPILLARY     Status: Normal   Collection Time   04/06/12 11:46 AM      Component Value Range Comment   Glucose-Capillary 97  70 - 99 mg/dL      HPI : 61 y/o F with hx of DM, HL, GERD, negative nuc 12/2007, no prior h/o CAD presented to [facility] with complaints of chest pain yesterday. She was in her usual state of health yesterday. At 9am while lying in bed already awake, she developed L sided chest pressure without any associated SOB, diaphoresis, nausea, dizziness or SOB. She layed there for a little while hoping for it to pass but when it didn't, she notified her family who brought her to the ER. She took 2 baby ASA prior to coming which did help to ease the discomfort. It was still present in the ER so she received 2 more ASA, morphine, and NTG - she believes the combination of these medicines made her pain free but cannot discern which one. In total she had 3 hours of constant pain. No syncope. 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 No recent long travel, bodily  trauma, LEE or orthopnea.  Troponins are negative x 5. EKG shows sinus bradycardia with multiple PVCs, nonspecific ST changes. Telemetry does reveal frequent PVCs, occasional bigeminy, and HRs 40s-50s with occasional dipping into the upper 30s. No significant pauses or evidence of heart block. She is not on any AV nodal blocking agents.   HOSPITAL COURSE:       CARDIOLOGY CONSULT NOTE    Patient ID: Victoria Gates, MRN: [id number], DOB/AGE: Oct 05, 1950 61 y.o. Admit date: 04/05/2012                          Date of Consult: 04/06/2012 Primary Physician: Romero Belling, MD Primary Cardiologist: New   Chief Complaint: chest pain   HPI: 61 y/o F with hx of DM, HL, GERD, negative nuc 12/2007, no prior h/o CAD presented to [facility] with complaints of chest pain yesterday. She was in her usual state of health yesterday. At 9am while lying in bed already awake, she developed L sided chest pressure without any associated SOB, diaphoresis, nausea, dizziness or SOB. She layed there for a little while hoping for it to pass but when it didn't, she notified her family who brought her to the ER. She took 2 baby ASA prior to coming which did help to ease the discomfort. It was still present in the ER so she received 2 more ASA, morphine, and NTG - she believes the combination of these medicines made her pain free but cannot discern which one. In total she had 3 hours of constant pain. No syncope. 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 No recent long travel, bodily trauma, LEE or orthopnea.   Troponins are negative x 5. EKG shows sinus bradycardia with multiple PVCs, nonspecific ST changes. Telemetry does reveal frequent PVCs, occasional bigeminy, and HRs 40s-50s with occasional dipping into the upper 30s. No  significant pauses or evidence of heart block. She is not on any AV nodal blocking agents.    Past Medical History   Diagnosis  Date   .  COLONIC POLYPS, HX OF  04/24/2007   .  OSTEOPOROSIS  04/24/2007   .  GERD  09/21/2010   .  DM  03/18/2008   .  HYPERCHOLESTEROLEMIA  07/03/2009   .  ANEMIA-IRON DEFICIENCY  04/24/2007   .  ALLERGIC RHINITIS CAUSE UNSPECIFIED  01/02/2009   .  ESOPHAGEAL STRICTURE  04/15/2007       s/p dilitation   .  HIATAL HERNIA  04/15/2007   .  CERVICAL RADICULOPATHY, LEFT  02/01/2010   .  HERPES ZOSTER  11/05/2010         Most Recent Cardiac Studies: Nuclear stress test 12/2007 - normal, EF 65%      Surgical History:   Past Surgical History   Procedure  Date   .  Tubal ligation     .  Esophagogastroduodenoscopy  04/15/2007      Home Meds: Prior to Admission medications    Medication  Sig  Start Date  End Date  Taking?  Authorizing Provider   aspirin 81 MG tablet  Take 81 mg by mouth daily.       Yes  Historical Provider, MD   Calcium 500-125 MG-UNIT TABS  Take 1 tablet by mouth daily.        Yes  Historical Provider, MD  cholecalciferol (VITAMIN D) 1000 UNITS tablet  Take 1,000 Units by mouth daily.      Yes  Historical Provider, MD   colestipol (COLESTID) 1 G tablet  Take 2 tablets (2 g total) by mouth daily.  03/25/12  03/25/13  Yes  Romero Belling, MD   fish oil-omega-3 fatty acids 1000 MG capsule  Take 1 capsule by mouth every other day.        Yes  Historical Provider, MD   lansoprazole (PREVACID) 30 MG capsule  TAKE ONE CAPSULE BY MOUTH EVERY DAY 1 HOUR BEFORE MEALS  09/07/11    Yes  Mardella Layman, MD   metFORMIN (GLUCOPHAGE-XR) 500 MG 24 hr tablet  Take 1 tablet (500 mg total) by mouth daily.  04/30/11    Yes  Romero Belling, MD   ZETIA 10 MG tablet  TAKE 1 TABLET BY MOUTH EVERY DAY  02/08/12    Yes  Romero Belling, MD        Inpatient Medications:      .  aspirin   324 mg  Oral  Once   .  aspirin   81 mg  Oral  Daily   .  docusate sodium   100 mg  Oral   BID   .  enoxaparin (LOVENOX) injection   40 mg  Subcutaneous  Q24H   .  ezetimibe   10 mg  Oral  Daily   .  insulin aspart   0-9 Units  Subcutaneous  Q4H   .  morphine   4 mg  Intravenous  Once   .  ondansetron   4 mg  Oral  Once   .  pantoprazole   40 mg  Oral  Q1200   .  potassium chloride   40 mEq  Oral  Once   .  potassium chloride   40 mEq  Oral  Once   .  sodium chloride   3 mL  Intravenous  Q12H   .  sodium chloride   3 mL  Intravenous  Q12H   .  DISCONTD: sodium chloride     Intravenous  STAT      Allergies:   Allergies   Allergen  Reactions   .  Atorvastatin         REACTION: myalgias   .  Rosuvastatin         REACTION: myalgias   .  Sulfamethoxazole         REACTION: unspecified   .  Sulfonamide Derivatives         History       Social History   .  Marital Status:  Married       Spouse Name:  N/A       Number of Children:  N/A   .  Years of Education:  N/A       Occupational History   .  MAIL CLERK         Social History Main Topics   .  Smoking status:  Never Smoker    .  Smokeless tobacco:  Not on file   .  Alcohol Use:  No   .  Drug Use:  No   .  Sexually Active:         Other Topics  Concern   .  Not on file       Social History Narrative   .  No narrative on file      Family History: Brain CA  in mother, lung cancer in father, diabetes in multiple family members. The patient denied any family hx of heart problems to me.   Review of Systems: General: negative for chills, fever, night sweats or weight changes.  Cardiovascular: negative for orthopnea, palpitations, paroxysmal nocturnal dyspnea, shortness of breath or dyspnea on exertion. No claudication. See above. Dermatological: negative for rash Respiratory: negative for cough or wheezing Urologic: negative for hematuria Abdominal: negative for nausea, vomiting, diarrhea, bright red blood per rectum, melena, or hematemesis Neurologic: negative for visual changes, syncope, or  dizziness All other systems reviewed and are otherwise negative except as noted above.   Labs: [facility]  04/06/12 0835  04/06/12 0229  04/05/12 2054  04/05/12 1701   CKTOTAL  102  107  124  --   CKMB  1.7  0.9  1.1  --   TROPONINI  <0.30  <0.30  <0.30  <0.30    Lab Results   Component  Value  Date     WBC  6.2  04/06/2012     HGB  11.1*  04/06/2012     HCT  32.4*  04/06/2012     MCV  85.0  04/06/2012     PLT  168  04/06/2012    Lab  04/06/12 0229   NA  139   K  3.3*   CL  106   CO2  24   BUN  14   CREATININE  0.88   CALCIUM  8.8   PROT  6.5   BILITOT  0.2*   ALKPHOS  48   ALT  20   AST  17   GLUCOSE  105*    Lab Results   Component  Value  Date     CHOL  170  04/06/2012     HDL  24*  04/06/2012     LDLCALC  92  04/06/2012     TRIG  268*  04/06/2012    Radiology/Studies:  1. Chest 2 View 04/05/2012  *RADIOLOGY REPORT*  Clinical Data: Chest pain.  Hypertension.  CHEST - 2 VIEW  Comparison: 04/05/2012.  Findings: Normal sized heart.  Clear lungs.  Mild scoliosis.  IMPRESSION: No acute abnormality.  Original Report Authenticated By: Darrol Angel, M.D.    2. Chest Port 1 View 04/05/2012  *RADIOLOGY REPORT*  Clinical Data: Mild chest pain beginning this morning, history of hypertension  PORTABLE CHEST - 1 VIEW  Comparison: 12/09/2009  Findings:  Possible increase in the size of the cardiac silhouette.  Unchanged mediastinal contours.  No focal airspace opacities.  There is mild eventration of the right hemidiaphragm.  No pleural effusion or pneumothorax.  No acute osseous abnormalities.  IMPRESSION: Apparent increase in the size of the cardiac silhouette may be artifactual secondary to slightly decreased lung volumes and AP projection.  Further evaluation with cardiac echo may be performed as clinically indicated.  Original Report Authenticated By: Waynard Reeds, M.D.    EKG:   04/05/12 - sinus bradycardia 53bpm with PVCs nonspecific ST upswooping inferolaterally 04/05/12 - sinus bradycardia  49bpm with multiple PVCs again nonspecific ST upswooping Aside from PVCs, EKGs are similar to 2012 tracings    Physical Exam:  Blood pressure 106/74, pulse 76, temperature 97.5 F (36.4 C), temperature source Oral, resp. rate 14, height 5\' 6"  (1.676 m), weight 185 lb 3 oz (84 kg), SpO2 95.00%. General: Well developed, well appearing AAF in no acute distress. Head: Normocephalic, atraumatic, sclera non-icteric, no xanthomas, nares are without discharge.  Neck: Negative for carotid bruits. JVD not elevated. Lungs: Clear bilaterally to auscultation without wheezes, rales, or rhonchi. Breathing is unlabored. Heart: Reg rhythm, bradycardic with S1 S2. No murmurs, rubs, or gallops appreciated. Abdomen: Soft, non-tender, non-distended with normoactive bowel sounds. No hepatomegaly. No rebound/guarding. No obvious abdominal masses. Msk:  Strength and tone appear normal for age. Extremities: No clubbing or cyanosis. No edema.  Distal pedal pulses are 2+ and equal bilaterally. Neuro: Alert and oriented X 3. Moves all extremities spontaneously. Psych:  Responds to questions appropriately with a normal affect.      Assessment and Plan:    1. Chest pain, atypical per cardiology, with negative cardiac enzymes. Doubt ischemic etiology but tele does reveal bradycardia and frequent PVCs. It is not clear that these two things are connected. Would recommend outpatient nuclear stress test which we will arrange.    2. Rhythm: Bradycardia & frequent PVCs - these are not clearly contributing to any symptoms. Not currently on any AV nodal blocking agents. TSH is WNL. Replete potassium. Check 2D echo which can also be an outpatient. Note she has had no exertional symptoms. Treadmill myoview will also allow [place] to assess her chronotropic competence. From cardiology standpoint she can be discharged.  3. Hypokalemia - already being repleted by primary team. 4. Diabetes mellitus -controlled, hemoglobin A1c of 6.2  continue outpatient treatment,  5. GERD - continue PPI.    Appointments: Treadmill Myoview stress test - 04/15/12 at 9:30am 2D echo & f/u with Flavia Shipper NP at Dr. Fabio Bering office - 04/20/12 at 2pm These appointments/instructions were placed in discharge instructions section.        Discharge Exam Blood pressure 106/74, pulse 76, temperature 97.5 F (36.4 C), temperature source Oral, resp. rate 14, height 5\' 6"  (1.676 m), weight 84 kg General: Well developed, well appearing AAF in no acute distress.  Head: Normocephalic, atraumatic, sclera non-icteric, no xanthomas, nares are without discharge.  Neck: Negative for carotid bruits. JVD not elevated.  Lungs: Clear bilaterally to auscultation without wheezes, rales, or rhonchi. Breathing is unlabored.  Heart: Reg rhythm, bradycardic with S1 S2. No murmurs, rubs, or gallops appreciated.  Abdomen: Soft, non-tender, non-distended with normoactive bowel sounds. No hepatomegaly. No rebound/guarding. No obvious abdominal masses.  Msk: Strength and tone appear normal for age.  Extremities: No clubbing or cyanosis. No edema. Distal pedal pulses are 2+ and equal bilaterally.  Neuro: Alert and oriented X 3. Moves all extremities spontaneously.  Psych: Responds to questions appropriately with a normal affect.  (185 lb 3 oz), SpO2 95.00%.     Discharge Orders    Future Appointments: Provider: Department: Dept Phone: Center:   04/15/2012 9:30 AM [facility] Nuclear 1 ([place] Nuclear Med  None   04/20/2012 2:00 PM [facility] Echo 2 [facility] Echo Lab  None   04/20/2012 3:00 PM Ok Anis, NP [facility] [phone] [id number]     Future Orders Please Complete By Expires   Diet - low sodium heart healthy      Increase activity slowly      Call MD for:  persistant nausea and vomiting      Call MD for:  difficulty breathing, headache or visual disturbances      Call MD for:  persistant dizziness or light-headedness          Follow-up Information    Follow up with [facility]. (04/15/12 at 9:30am - Treadmill Myoview Stress Test)    Contact information:   [address]  [address] [place] [address] [phone]      Follow up with Nicolasa Ducking, NP. (04/20/12 - heart ultrasound at 2pm and appointment with Ward Givens, NP at 3pm at [facility])    Contact information:   [address] [place] [address] [phone]          Signed: Richarda Overlie 04/06/2012, 11:58 AM

## 2012-04-06 NOTE — Progress Notes (Addendum)
Subjective: Chest pain-free States that she has a history of pericarditis Workup as far has been negative   Objective: Vital signs in last 24 hours: Filed Vitals:   04/05/12 1900 04/05/12 2049 04/05/12 2050 04/06/12 0215  BP: 120/53 137/65  106/74  Pulse: 44 87  76  Temp:  97.8 F (36.6 C)  97.5 F (36.4 C)  TempSrc:  Oral  Oral  Resp: 14   14  Height:   5\' 6"  (1.676 m)   Weight:   84 kg (185 lb 3 oz)   SpO2:  97%  95%   No intake or output data in the 24 hours ending 04/06/12 0911  Weight change:    1. General: in No Acute distress  2. Psychological: Alert and Oriented  3. Head/ENT: Moist Mucous Membranes  Head Non traumatic, neck supple  Normal Dentition  4. SKIN: normal Skin turgor, Skin clean Dry and intact no rash  5. Heart: slow but Regular rate and rhythm no Murmur, Rub or gallop  6. Lungs: Clear to auscultation bilaterally, no wheezes or crackles  7. Abdomen: Soft, non-tender, Non distended  8. Lower extremities: no clubbing, cyanosis, or edema  9. Neurologically Grossly intact, moving all 4 extremities equally strength 5/5 in all 4 ext.  10. MSK: Normal range of motion  Lab Results: Results for orders placed during the hospital encounter of 04/05/12 (from the past 24 hour(s))  CBC WITH DIFFERENTIAL     Status: Abnormal   Collection Time   04/05/12  1:31 PM      Component Value Range   WBC 4.6  4.0 - 10.5 K/uL   RBC 4.08  3.87 - 5.11 MIL/uL   Hemoglobin 11.9 (*) 12.0 - 15.0 g/dL   HCT 30.8 (*) 65.7 - 84.6 %   MCV 85.8  78.0 - 100.0 fL   MCH 29.2  26.0 - 34.0 pg   MCHC 34.0  30.0 - 36.0 g/dL   RDW 96.2  95.2 - 84.1 %   Platelets 192  150 - 400 K/uL   Neutrophils Relative 35 (*) 43 - 77 %   Neutro Abs 1.6 (*) 1.7 - 7.7 K/uL   Lymphocytes Relative 59 (*) 12 - 46 %   Lymphs Abs 2.7  0.7 - 4.0 K/uL   Monocytes Relative 4  3 - 12 %   Monocytes Absolute 0.2  0.1 - 1.0 K/uL   Eosinophils Relative 2  0 - 5 %   Eosinophils Absolute 0.1  0.0 - 0.7 K/uL   Basophils Relative 0  0 - 1 %   Basophils Absolute 0.0  0.0 - 0.1 K/uL  PROTIME-INR     Status: Normal   Collection Time   04/05/12  1:31 PM      Component Value Range   Prothrombin Time 13.8  11.6 - 15.2 seconds   INR 1.04  0.00 - 1.49  APTT     Status: Normal   Collection Time   04/05/12  1:31 PM      Component Value Range   aPTT 33  24 - 37 seconds  COMPREHENSIVE METABOLIC PANEL     Status: Abnormal   Collection Time   04/05/12  1:31 PM      Component Value Range   Sodium 137  135 - 145 mEq/L   Potassium 3.9  3.5 - 5.1 mEq/L   Chloride 104  96 - 112 mEq/L   CO2 25  19 - 32 mEq/L   Glucose, Bld 94  70 - 99 mg/dL   BUN 14  6 - 23 mg/dL   Creatinine, Ser 1.61  0.50 - 1.10 mg/dL   Calcium 9.3  8.4 - 09.6 mg/dL   Total Protein 7.0  6.0 - 8.3 g/dL   Albumin 3.2 (*) 3.5 - 5.2 g/dL   AST 21  0 - 37 U/L   ALT 23  0 - 35 U/L   Alkaline Phosphatase 54  39 - 117 U/L   Total Bilirubin 0.3  0.3 - 1.2 mg/dL   GFR calc non Af Amer 60 (*) >90 mL/min   GFR calc Af Amer 70 (*) >90 mL/min  TROPONIN I     Status: Normal   Collection Time   04/05/12  1:31 PM      Component Value Range   Troponin I <0.30  <0.30 ng/mL  TROPONIN I     Status: Normal   Collection Time   04/05/12  5:01 PM      Component Value Range   Troponin I <0.30  <0.30 ng/mL  CARDIAC PANEL(CRET KIN+CKTOT+MB+TROPI)     Status: Normal   Collection Time   04/05/12  8:54 PM      Component Value Range   Total CK 124  7 - 177 U/L   CK, MB 1.1  0.3 - 4.0 ng/mL   Troponin I <0.30  <0.30 ng/mL   Relative Index 0.9  0.0 - 2.5  CBC     Status: Abnormal   Collection Time   04/05/12  8:54 PM      Component Value Range   WBC 5.8  4.0 - 10.5 K/uL   RBC 3.92  3.87 - 5.11 MIL/uL   Hemoglobin 11.5 (*) 12.0 - 15.0 g/dL   HCT 04.5 (*) 40.9 - 81.1 %   MCV 84.7  78.0 - 100.0 fL   MCH 29.3  26.0 - 34.0 pg   MCHC 34.6  30.0 - 36.0 g/dL   RDW 91.4  78.2 - 95.6 %   Platelets 193  150 - 400 K/uL  CREATININE, SERUM     Status: Abnormal    Collection Time   04/05/12  8:54 PM      Component Value Range   Creatinine, Ser 0.84  0.50 - 1.10 mg/dL   GFR calc non Af Amer 74 (*) >90 mL/min   GFR calc Af Amer 85 (*) >90 mL/min  HEMOGLOBIN A1C     Status: Abnormal   Collection Time   04/05/12  8:54 PM      Component Value Range   Hemoglobin A1C 6.2 (*) <5.7 %   Mean Plasma Glucose 131 (*) <117 mg/dL  GLUCOSE, CAPILLARY     Status: Normal   Collection Time   04/05/12  9:32 PM      Component Value Range   Glucose-Capillary 86  70 - 99 mg/dL  GLUCOSE, CAPILLARY     Status: Abnormal   Collection Time   04/06/12 12:21 AM      Component Value Range   Glucose-Capillary 113 (*) 70 - 99 mg/dL  MAGNESIUM     Status: Normal   Collection Time   04/06/12  2:29 AM      Component Value Range   Magnesium 1.9  1.5 - 2.5 mg/dL  PHOSPHORUS     Status: Normal   Collection Time   04/06/12  2:29 AM      Component Value Range   Phosphorus 4.5  2.3 - 4.6 mg/dL  COMPREHENSIVE METABOLIC PANEL  Status: Abnormal   Collection Time   04/06/12  2:29 AM      Component Value Range   Sodium 139  135 - 145 mEq/L   Potassium 3.3 (*) 3.5 - 5.1 mEq/L   Chloride 106  96 - 112 mEq/L   CO2 24  19 - 32 mEq/L   Glucose, Bld 105 (*) 70 - 99 mg/dL   BUN 14  6 - 23 mg/dL   Creatinine, Ser 4.78  0.50 - 1.10 mg/dL   Calcium 8.8  8.4 - 29.5 mg/dL   Total Protein 6.5  6.0 - 8.3 g/dL   Albumin 2.9 (*) 3.5 - 5.2 g/dL   AST 17  0 - 37 U/L   ALT 20  0 - 35 U/L   Alkaline Phosphatase 48  39 - 117 U/L   Total Bilirubin 0.2 (*) 0.3 - 1.2 mg/dL   GFR calc non Af Amer 69 (*) >90 mL/min   GFR calc Af Amer 81 (*) >90 mL/min  CBC     Status: Abnormal   Collection Time   04/06/12  2:29 AM      Component Value Range   WBC 6.2  4.0 - 10.5 K/uL   RBC 3.81 (*) 3.87 - 5.11 MIL/uL   Hemoglobin 11.1 (*) 12.0 - 15.0 g/dL   HCT 62.1 (*) 30.8 - 65.7 %   MCV 85.0  78.0 - 100.0 fL   MCH 29.1  26.0 - 34.0 pg   MCHC 34.3  30.0 - 36.0 g/dL   RDW 84.6  96.2 - 95.2 %   Platelets 168  150  - 400 K/uL  CARDIAC PANEL(CRET KIN+CKTOT+MB+TROPI)     Status: Normal   Collection Time   04/06/12  2:29 AM      Component Value Range   Total CK 107  7 - 177 U/L   CK, MB 0.9  0.3 - 4.0 ng/mL   Troponin I <0.30  <0.30 ng/mL   Relative Index 0.8  0.0 - 2.5  LIPID PANEL     Status: Abnormal   Collection Time   04/06/12  2:29 AM      Component Value Range   Cholesterol 170  0 - 200 mg/dL   Triglycerides 841 (*) <150 mg/dL   HDL 24 (*) >32 mg/dL   Total CHOL/HDL Ratio 7.1     VLDL 54 (*) 0 - 40 mg/dL   LDL Cholesterol 92  0 - 99 mg/dL  GLUCOSE, CAPILLARY     Status: Normal   Collection Time   04/06/12  4:02 AM      Component Value Range   Glucose-Capillary 97  70 - 99 mg/dL  GLUCOSE, CAPILLARY     Status: Normal   Collection Time   04/06/12  7:23 AM      Component Value Range   Glucose-Capillary 90  70 - 99 mg/dL     Micro: No results found for this or any previous visit (from the past 240 hour(s)).  Studies/Results: Dg Chest 2 View  04/05/2012  *RADIOLOGY REPORT*  Clinical Data: Chest pain.  Hypertension.  CHEST - 2 VIEW  Comparison: 04/05/2012.  Findings: Normal sized heart.  Clear lungs.  Mild scoliosis.  IMPRESSION: No acute abnormality.  Original Report Authenticated By: Darrol Angel, M.D.   Dg Chest Port 1 View  04/05/2012  *RADIOLOGY REPORT*  Clinical Data: Mild chest pain beginning this morning, history of hypertension  PORTABLE CHEST - 1 VIEW  Comparison: 12/09/2009  Findings:  Possible increase in the size of the cardiac silhouette.  Unchanged mediastinal contours.  No focal airspace opacities.  There is mild eventration of the right hemidiaphragm.  No pleural effusion or pneumothorax.  No acute osseous abnormalities.  IMPRESSION: Apparent increase in the size of the cardiac silhouette may be artifactual secondary to slightly decreased lung volumes and AP projection.  Further evaluation with cardiac echo may be performed as clinically indicated.  Original Report Authenticated  By: Waynard Reeds, M.D.    Medications:  Scheduled Meds:   . aspirin  324 mg Oral Once  . aspirin  81 mg Oral Daily  . docusate sodium  100 mg Oral BID  . enoxaparin (LOVENOX) injection  40 mg Subcutaneous Q24H  . ezetimibe  10 mg Oral Daily  . insulin aspart  0-9 Units Subcutaneous Q4H  . morphine  4 mg Intravenous Once  . ondansetron  4 mg Oral Once  . pantoprazole  40 mg Oral Q1200  . potassium chloride  40 mEq Oral Once  . potassium chloride  40 mEq Oral Once  . sodium chloride  3 mL Intravenous Q12H  . sodium chloride  3 mL Intravenous Q12H  . DISCONTD: sodium chloride   Intravenous STAT   Continuous Infusions:  PRN Meds:.sodium chloride, acetaminophen, acetaminophen, albuterol, alum & mag hydroxide-simeth, guaiFENesin-dextromethorphan, HYDROcodone-acetaminophen, morphine injection, nitroGLYCERIN, ondansetron (ZOFRAN) IV, ondansetron, sodium chloride, DISCONTD:  HYDROmorphone (DILAUDID) injection, DISCONTD: nitroGLYCERIN, DISCONTD: ondansetron (ZOFRAN) IV   Assessment: Principal Problem:  *Unspecified Chest Pain Active Problems:  DM  HYPERCHOLESTEROLEMIA  GERD   Plan: #1 chest pain cardiac enzymes negative. EKG shows normal sinus rhythm. Given multiple cardiovascular risk factors a stress test will be done today. Cardiology consulted on the 2-D echo #2 diabetes continue the patient on sliding scale insulin #3 GERD continue Protonix  #4 bradycardia, TSH pending, avoid beta blockers   LOS: 1 day   [facility] 04/06/2012, 9:11 AM

## 2012-04-06 NOTE — Consult Note (Signed)
CARDIOLOGY CONSULT NOTE  Patient ID: Victoria Gates, MRN: [id number], DOB/AGE: [birth year] 61 y.o. Admit date: 04/05/2012   Date of Consult: 04/06/2012 Primary Physician: Romero Belling, MD Primary Cardiologist: New  Chief Complaint: chest pain  HPI: 61 y/o F with hx of DM, HL, GERD, negative nuc 12/2007, no prior h/o CAD presented to [facility] with complaints of chest pain yesterday. She was in her usual state of health yesterday. At 9am while lying in bed already awake, she developed L sided chest pressure without any associated SOB, diaphoresis, nausea, dizziness or SOB. She layed there for a little while hoping for it to pass but when it didn't, she notified her family who brought her to the ER. She took 2 baby ASA prior to coming which did help to ease the discomfort. It was still present in the ER so she received 2 more ASA, morphine, and NTG - she believes the combination of these medicines made her pain free but cannot discern which one. In total she had 3 hours of constant pain. No syncope. 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 No recent long travel, bodily trauma, LEE or orthopnea.  Troponins are negative x 5. EKG shows sinus bradycardia with multiple PVCs, nonspecific ST changes. Telemetry does reveal frequent PVCs, occasional bigeminy, and HRs 40s-50s with occasional dipping into the upper 30s. No significant pauses or evidence of heart block. She is not on any AV nodal blocking agents.  Past Medical History  Diagnosis Date  . COLONIC POLYPS, HX OF 04/24/2007  . OSTEOPOROSIS 04/24/2007  . GERD 09/21/2010  . DM 03/18/2008  . HYPERCHOLESTEROLEMIA 07/03/2009  . ANEMIA-IRON DEFICIENCY 04/24/2007  . ALLERGIC RHINITIS CAUSE UNSPECIFIED 01/02/2009  . ESOPHAGEAL STRICTURE 04/15/2007    s/p dilitation  . HIATAL HERNIA 04/15/2007  . CERVICAL RADICULOPATHY, LEFT 02/01/2010  .  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 Tubal ligation   . Esophagogastroduodenoscopy 04/15/2007     Home Meds: Prior to Admission medications   Medication Sig Start Date End Date Taking? Authorizing Provider  aspirin 81 MG tablet Take 81 mg by mouth daily.    Yes Historical Provider, MD  Calcium 500-125 MG-UNIT TABS Take 1 tablet by mouth daily.     Yes Historical Provider, MD  cholecalciferol (VITAMIN D) 1000 UNITS tablet Take 1,000 Units by mouth daily.   Yes Historical Provider, MD  colestipol (COLESTID) 1 G tablet Take 2 tablets (2 g total) by mouth daily. 03/25/12 03/25/13 Yes Romero Belling, MD  fish oil-omega-3 fatty acids 1000 MG capsule Take 1 capsule by mouth every other day.     Yes Historical Provider, MD  lansoprazole (PREVACID) 30 MG capsule TAKE ONE CAPSULE BY MOUTH EVERY DAY 1 HOUR BEFORE MEALS 09/07/11  Yes Mardella Layman, MD  metFORMIN (GLUCOPHAGE-XR) 500 MG 24 hr tablet Take 1 tablet (500 mg total) by mouth daily. 04/30/11  Yes Romero Belling, MD  ZETIA 10 MG tablet TAKE 1 TABLET BY MOUTH EVERY DAY 02/08/12  Yes Romero Belling, MD    Inpatient Medications:     . aspirin  324 mg Oral Once  . aspirin  81 mg Oral Daily  . docusate sodium  100 mg Oral BID  . enoxaparin (  LOVENOX) injection  40 mg Subcutaneous Q24H  . ezetimibe  10 mg Oral Daily  . insulin aspart  0-9 Units Subcutaneous Q4H  . morphine  4 mg Intravenous Once  . ondansetron  4 mg Oral Once  . pantoprazole  40 mg Oral Q1200  . potassium chloride  40 mEq Oral Once  . potassium chloride  40 mEq Oral Once  . sodium chloride  3 mL Intravenous Q12H  . sodium chloride  3 mL Intravenous Q12H  . DISCONTD: sodium chloride   Intravenous STAT    Allergies:  Allergies  Allergen Reactions  . Atorvastatin     REACTION: myalgias  . Rosuvastatin     REACTION: myalgias  . Sulfamethoxazole       REACTION: unspecified  . Sulfonamide Derivatives     History   Social History  . Marital Status: Married    Spouse Name: N/A    Number of Children: N/A  . Years of Education: N/A   Occupational History  . MAIL CLERK    Social History Main Topics  . Smoking status: Never Smoker   . Smokeless tobacco: Not on file  . Alcohol Use: No  . Drug Use: No  . Sexually Active:    Other Topics Concern  . Not on file   Social History Narrative  . No narrative on file    Family History: Brain CA in mother, lung cancer in father, diabetes in multiple family members. The patient denied any family hx of heart problems to me.  Review of Systems: General: negative for chills, fever, night sweats or weight changes.  Cardiovascular: negative for orthopnea, palpitations, paroxysmal nocturnal dyspnea, shortness of breath or dyspnea on exertion. No claudication. See above. Dermatological: negative for rash Respiratory: negative for cough or wheezing Urologic: negative for hematuria Abdominal: negative for nausea, vomiting, diarrhea, bright red blood per rectum, melena, or hematemesis Neurologic: negative for visual changes, syncope, or dizziness All other systems reviewed and are otherwise negative except as noted above.  Labs:  [facility] 04/06/12 0835 04/06/12 0229 04/05/12 2054 04/05/12 1701  CKTOTAL 102 107 124 --  CKMB 1.7 0.9 1.1 --  TROPONINI <0.30 <0.30 <0.30 <0.30   Lab Results  Component Value Date   WBC 6.2 04/06/2012   HGB 11.1* 04/06/2012   HCT 32.4* 04/06/2012   MCV 85.0 04/06/2012   PLT 168 04/06/2012     Lab 04/06/12 0229  NA 139  K 3.3*  CL 106  CO2 24  BUN 14  CREATININE 0.88  CALCIUM 8.8  PROT 6.5  BILITOT 0.2*  ALKPHOS 48  ALT 20  AST 17  GLUCOSE 105*   Lab Results  Component Value Date   CHOL 170 04/06/2012   HDL 24* 04/06/2012   LDLCALC 92 04/06/2012   TRIG 268* 04/06/2012   Radiology/Studies:  1. Chest 2 View 04/05/2012  *RADIOLOGY REPORT*  Clinical Data:  Chest pain.  Hypertension.  CHEST - 2 VIEW  Comparison: 04/05/2012.  Findings: Normal sized heart.  Clear lungs.  Mild scoliosis.  IMPRESSION: No acute abnormality.  Original Report Authenticated By: Darrol Angel, M.D.   2. Chest Port 1 View 04/05/2012  *RADIOLOGY REPORT*  Clinical Data: Mild chest pain beginning this morning, history of hypertension  PORTABLE CHEST - 1 VIEW  Comparison: 12/09/2009  Findings:  Possible increase in the size of the cardiac silhouette.  Unchanged mediastinal contours.  No focal airspace opacities.  There is mild eventration of the right hemidiaphragm.  No pleural effusion  or pneumothorax.  No acute osseous abnormalities.  IMPRESSION: Apparent increase in the size of the cardiac silhouette may be artifactual secondary to slightly decreased lung volumes and AP projection.  Further evaluation with cardiac echo may be performed as clinically indicated.  Original Report Authenticated By: Waynard Reeds, M.D.   EKG:  04/05/12 - sinus bradycardia 53bpm with PVCs nonspecific ST upswooping inferolaterally 04/05/12 - sinus bradycardia 49bpm with multiple PVCs again nonspecific ST upswooping Aside from PVCs, EKGs are similar to 2012 tracings  Physical Exam: Blood pressure 106/74, pulse 76, temperature 97.5 F (36.4 C), temperature source Oral, resp. rate 14, height 5\' 6"  (1.676 m), weight 185 lb 3 oz (84 kg), SpO2 95.00%. General: Well developed, well appearing AAF in no acute distress. Head: Normocephalic, atraumatic, sclera non-icteric, no xanthomas, nares are without discharge.  Neck: Negative for carotid bruits. JVD not elevated. Lungs: Clear bilaterally to auscultation without wheezes, rales, or rhonchi. Breathing is unlabored. Heart: Reg rhythm, bradycardic with S1 S2. No murmurs, rubs, or gallops appreciated. Abdomen: Soft, non-tender, non-distended with normoactive bowel sounds. No hepatomegaly. No rebound/guarding. No obvious abdominal masses. Msk:  Strength and tone  appear normal for age. Extremities: No clubbing or cyanosis. No edema.  Distal pedal pulses are 2+ and equal bilaterally. Neuro: Alert and oriented X 3. Moves all extremities spontaneously. Psych:  Responds to questions appropriately with a normal affect.   Assessment and Plan:   1. Chest pain, atypical with negative cardiac enzymes. Doubt ischemic etiology but tele does reveal bradycardia and frequent PVCs. It is not clear that these two things are connected. Would recommend outpatient nuclear stress test which we will arrange.  2. Rhythm: Bradycardia & frequent PVCs - these are not clearly contributing to any symptoms. Not currently on any AV nodal blocking agents. TSH is WNL. Replete potassium. Check 2D echo which can also be an outpatient. Note she has had no exertional symptoms. Treadmill myoview will also allow [place] to assess her chronotropic competence. From our standpoint she can be discharged. 3. Hypokalemia - already being repleted by primary team. 4. Diabetes mellitus - management per primary team. 5. GERD - continue PPI.   Appointments: Treadmill Myoview stress test - 04/15/12 at 9:30am 2D echo & f/u with Flavia Shipper NP at Dr. Fabio Bering office - 04/20/12 at 2pm These appointments/instructions were placed in discharge instructions section.  Signed, Ronie Spies PA-C 04/06/2012, 10:41 AM  Patient examined chart reviewed.  Atypical pain with normal ECG.  Relative bradycardia with no AV block and narrow QRS Infrequent PVC/  Stable for D/C outpatient echo and stress myovue.  Patient agreeable with plan.  Charlton Haws 11:54 AM 04/06/2012

## 2012-04-07 NOTE — ED Provider Notes (Signed)
Medical screening examination/treatment/procedure(s) were conducted as a shared visit with non-physician practitioner(s) and myself.  I personally evaluated the patient during the encounter   Loren Racer, MD 04/07/12 [phone]

## 2012-04-15 ENCOUNTER — Ambulatory Visit (HOSPITAL_COMMUNITY): Payer: BC Managed Care – PPO | Attending: Cardiology | Admitting: Radiology

## 2012-04-15 VITALS — BP 97/68 | HR 54 | Ht 67.0 in | Wt 187.0 lb

## 2012-04-15 DIAGNOSIS — E119 Type 2 diabetes mellitus without complications: Secondary | ICD-10-CM

## 2012-04-15 DIAGNOSIS — R079 Chest pain, unspecified: Secondary | ICD-10-CM

## 2012-04-15 DIAGNOSIS — I10 Essential (primary) hypertension: Secondary | ICD-10-CM | POA: Insufficient documentation

## 2012-04-15 DIAGNOSIS — I4949 Other premature depolarization: Secondary | ICD-10-CM

## 2012-04-15 DIAGNOSIS — R0789 Other chest pain: Secondary | ICD-10-CM | POA: Insufficient documentation

## 2012-04-15 DIAGNOSIS — E785 Hyperlipidemia, unspecified: Secondary | ICD-10-CM | POA: Insufficient documentation

## 2012-04-15 MED ORDER — TECHNETIUM TC 99M TETROFOSMIN IV KIT
30.0000 | PACK | Freq: Once | INTRAVENOUS | Status: AC | PRN
  Administered 2012-04-15: 30 via INTRAVENOUS

## 2012-04-15 MED ORDER — TECHNETIUM TC 99M TETROFOSMIN IV KIT
10.0000 | PACK | Freq: Once | INTRAVENOUS | Status: AC | PRN
  Administered 2012-04-15: 10 via INTRAVENOUS

## 2012-04-15 NOTE — Progress Notes (Signed)
[facility] SITE 3 NUCLEAR MED [address] [place] [address] [phone]  Cardiology Nuclear Med Study  Victoria Gates is a 61 y.o. female     MRN : [id number]     DOB: 27-Jun-1951  Procedure Date: 04/15/2012  Nuclear Med Background Indication for Stress Test:  Evaluation for Ischemia and 04/05/12 Post Hospital with Chest Pressure, (-) Enzymes History:  '09 EXB:[id number], EF=65% Cardiac Risk Factors: Hypertension, Lipids and NIDDM  Symptoms:  Chest Pressure>to Back and (L) Shoulder.  (last episode of chest discomfort:none since discharge)   Nuclear Pre-Procedure Caffeine/Decaff Intake:  None NPO After: 8:00pm   Lungs:  Clear. IV 0.9% NS with Angio Cath:  20g  IV Site: R Antecubital  IV Started by:  Stanton Kidney, EMT-P  Chest Size (in):  38 Cup Size: C  Height: 5\' 7"  (1.702 m)  Weight:  187 lb (84.823 kg)  BMI:  Body mass index is 29.29 kg/(m^2). Tech Comments:  NA    Nuclear Med Study 1 or 2 day study: 1 day  Stress Test Type:  Stress  Reading MD: Marca Ancona, MD  Order Authorizing Provider:  Romero Belling, MD  Resting Radionuclide: Technetium 73m Tetrofosmin  Resting Radionuclide Dose: 11.0 mCi   Stress Radionuclide:  Technetium 29m Tetrofosmin  Stress Radionuclide Dose: 32.5 mCi           Stress Protocol Rest HR: 54 Stress HR: 173  Rest BP: 97/68 Stress BP: 180/83  Exercise Time (min): 10:00 METS: 11.7   Predicted Max HR: 159 bpm % Max HR: 108.81 bpm Rate Pressure Product: 44010   Dose of Adenosine (mg):  n/a Dose of Lexiscan: n/a mg  Dose of Atropine (mg): n/a Dose of Dobutamine: n/a mcg/kg/min (at max HR)  Stress Test Technologist: Smiley Houseman, CMA-N  Nuclear Technologist:  Domenic Polite, CNMT     Rest Procedure:  Myocardial perfusion imaging was performed at rest 45 minutes following the intravenous administration of Technetium 47m Tetrofosmin.  Rest ECG: No acute changes, occasional PVC's were noted.  Stress Procedure:  The patient  performed treadmill exercise using a Bruce  Protocol for 10:00 minutes. The patient stopped due to fatigue and denied any chest pain.  There were nonspecific ST-T wave changes and occasional PVC's/PAC's noted.  Technetium 65m Tetrofosmin was injected at peak exercise and myocardial perfusion imaging was performed after a brief delay.  Stress ECG: Insignificant upsloping ST segment depression.  QPS Raw Data Images:  Normal; no motion artifact; normal heart/lung ratio. Stress Images:  Normal homogeneous uptake in all areas of the myocardium. Rest Images:  Normal homogeneous uptake in all areas of the myocardium. Subtraction (SDS):  There is no evidence of scar or ischemia. Transient Ischemic Dilatation (Normal <1.22):  0.88 Lung/Heart Ratio (Normal <0.45):  0.22  Quantitative Gated Spect Images QGS EDV:  79 ml QGS ESV:  30 ml  Impression Exercise Capacity:  Excellent exercise capacity. BP Response:  Normal blood pressure response. Clinical Symptoms:  Fatigue, no chest pain.  ECG Impression:  Insignificant upsloping ST segment depression. Comparison with Prior Nuclear Study: No images to compare  Overall Impression:  Normal stress nuclear study.  LV Ejection Fraction: 63%.  LV Wall Motion:  NL LV Function; NL Wall Motion  Marca Ancona 04/15/2012

## 2012-04-17 ENCOUNTER — Encounter: Payer: Self-pay | Admitting: Gastroenterology

## 2012-04-20 ENCOUNTER — Encounter: Payer: Self-pay | Admitting: Nurse Practitioner

## 2012-04-20 ENCOUNTER — Other Ambulatory Visit (HOSPITAL_COMMUNITY): Payer: Self-pay | Admitting: Endocrinology

## 2012-04-20 ENCOUNTER — Ambulatory Visit (INDEPENDENT_AMBULATORY_CARE_PROVIDER_SITE_OTHER): Payer: BC Managed Care – PPO | Admitting: Nurse Practitioner

## 2012-04-20 ENCOUNTER — Ambulatory Visit (HOSPITAL_COMMUNITY): Payer: BC Managed Care – PPO | Attending: Cardiology | Admitting: Radiology

## 2012-04-20 VITALS — BP 115/78 | HR 78 | Ht 66.0 in | Wt 188.0 lb

## 2012-04-20 DIAGNOSIS — I498 Other specified cardiac arrhythmias: Secondary | ICD-10-CM | POA: Insufficient documentation

## 2012-04-20 DIAGNOSIS — R079 Chest pain, unspecified: Secondary | ICD-10-CM | POA: Insufficient documentation

## 2012-04-20 DIAGNOSIS — R072 Precordial pain: Secondary | ICD-10-CM | POA: Insufficient documentation

## 2012-04-20 DIAGNOSIS — E119 Type 2 diabetes mellitus without complications: Secondary | ICD-10-CM | POA: Insufficient documentation

## 2012-04-20 DIAGNOSIS — I10 Essential (primary) hypertension: Secondary | ICD-10-CM | POA: Insufficient documentation

## 2012-04-20 DIAGNOSIS — R0789 Other chest pain: Secondary | ICD-10-CM | POA: Insufficient documentation

## 2012-04-20 DIAGNOSIS — E785 Hyperlipidemia, unspecified: Secondary | ICD-10-CM | POA: Insufficient documentation

## 2012-04-20 NOTE — Progress Notes (Signed)
Echocardiogram performed.  

## 2012-04-20 NOTE — Progress Notes (Signed)
Patient Name: Victoria Gates Date of Encounter: 04/20/2012  Primary Care Provider:  Romero Belling, MD Primary Cardiologist:  P. Eden Emms, MD  Patient Profile  61 y/o female who was recently hospitalized with chest pain who presents for f/u.  Problem List   Past Medical History  Diagnosis Date  . COLONIC POLYPS, HX OF 04/24/2007  . OSTEOPOROSIS 04/24/2007  . GERD 09/21/2010  . DM 03/18/2008  . HYPERCHOLESTEROLEMIA 07/03/2009  . ANEMIA-IRON DEFICIENCY 04/24/2007  . ALLERGIC RHINITIS CAUSE UNSPECIFIED 01/02/2009  . ESOPHAGEAL STRICTURE 04/15/2007    s/p dilitation  . HIATAL HERNIA 04/15/2007  . CERVICAL RADICULOPATHY, LEFT 02/01/2010  . 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 Chest pain 03/2012    a. 04/15/2012 Ex MV: Ex time 10 mins, EF 63%, no ischemia/infarct.  Occas PAC's/PVC's.   Past Surgical History  Procedure Date  . Tubal ligation   . Esophagogastroduodenoscopy 04/15/2007    Allergies  Allergies  Allergen Reactions  . Atorvastatin     REACTION: myalgias  . Rosuvastatin     REACTION: myalgias  . Sulfamethoxazole     REACTION: unspecified  . Sulfonamide Derivatives     HPI  61 y/o female with the above problem list.  She was recently hospitalized 2/2 chest pain.  She was also noted to be relatively bradycardic with occas pvc's and pac's.  There was no obj evidence of ischemia and c/p was felt to be atypical.  Following d/c, she underwent exercise myoview, which showed nl LV fxn and no evidence of ischemia or infarct.  She had nl chronotropic response to exercise and only occas pac's/pvc's.  She has had no recurrence of chest pain since hospitalization.  She denies pnd, orthopnea, doe, n, v, dizziness, syncope, edema, palpitations, or early satiety.    Home Medications  Prior to Admission medications   Medication Sig Start Date End Date Taking? Authorizing Provider  aspirin 81 MG tablet Take 81 mg by mouth daily.    Yes Historical Provider, MD  Calcium 500-125 MG-UNIT TABS Take 1  tablet by mouth daily.     Yes Historical Provider, MD  cholecalciferol (VITAMIN D) 1000 UNITS tablet Take 1,000 Units by mouth daily.   Yes Historical Provider, MD  colestipol (COLESTID) 1 G tablet Take 2 tablets (2 g total) by mouth daily. 03/25/12 03/25/13 Yes Romero Belling, MD  fish oil-omega-3 fatty acids 1000 MG capsule Take 1 capsule by mouth every other day.     Yes Historical Provider, MD  lansoprazole (PREVACID) 30 MG capsule TAKE ONE CAPSULE BY MOUTH EVERY DAY 1 HOUR BEFORE MEALS 09/07/11  Yes Mardella Layman, MD  metFORMIN (GLUCOPHAGE-XR) 500 MG 24 hr tablet Take 1 tablet (500 mg total) by mouth daily. 04/30/11  Yes Romero Belling, MD  ZETIA 10 MG tablet TAKE 1 TABLET BY MOUTH EVERY DAY 02/08/12  Yes Romero Belling, MD    Review of Systems  As above, she has been doing well.  No chest pain, sob, n, v, dizziness, syncope, edema, early satiety, dysuria, dark stools, blood in stools, diarrhea, rash/skin changes, fevers, chills, wt loss/gain.  Otherwise all systems reviewed and negative.  Physical Exam  Blood pressure 115/78, pulse 78, height 5\' 6"  (1.676 m), weight 188 lb (85.276 kg).  General: Pleasant, NAD Psych: Normal affect. Neuro: Alert and oriented X 3. Moves all extremities spontaneously. HEENT: Normal  Neck: Supple without bruits or JVD. Lungs:  Resp regular and unlabored, CTA. Heart: RRR no s3, s4, or murmurs. Abdomen: Soft, non-tender, non-distended, BS +  x 4.  Extremities: No clubbing, cyanosis or edema. DP/PT/Radials 2+ and equal bilaterally.  Accessory Clinical Findings  Myoview 04/15/2012: NL EF, no isch/infarct Echo - performed today, result pending.  Assessment & Plan  1.  Chest Pain:  No recurrence.  Nl myoview last week.    2.  PAC's/PVC's:  Occasional during myoview.  Asymp.  Echo was just performed and results are not readily available.  EF was nl on myoview.  Will call her w/ results.  3.  GERD:  She thinks this may have been the root of her c/p.  Cont  ppi.  4.  DM:  Per IM.  5.  HL:  Intolerant to statins.  Cont zetia.  6.  Dispo:  Review echo.  F/u prn if normal.   Nicolasa Ducking, NP 04/20/2012, 3:27 PM

## 2012-05-12 ENCOUNTER — Telehealth: Payer: Self-pay | Admitting: Nurse Practitioner

## 2012-05-12 NOTE — Telephone Encounter (Signed)
Patient called not at home.Spoke to husband was told echo has not been reviewed by Dr.Crenshaw yet.Will call back with results once Dr.has reviewed.

## 2012-05-12 NOTE — Telephone Encounter (Signed)
Pt calling re results from test 7-22 ok to leave message

## 2012-05-20 NOTE — Telephone Encounter (Signed)
Pt seen by dr Eden Emms in the hosp, will forward to his nurse.

## 2012-05-20 NOTE — Telephone Encounter (Signed)
PER DR Celene Squibb AND ECHO LOOK  GOOD PT'S HUSBAND AWARE .Victoria Gates

## 2012-05-22 ENCOUNTER — Other Ambulatory Visit: Payer: Self-pay | Admitting: Endocrinology

## 2012-06-02 ENCOUNTER — Ambulatory Visit (AMBULATORY_SURGERY_CENTER): Payer: BC Managed Care – PPO | Admitting: *Deleted

## 2012-06-02 VITALS — Ht 67.0 in | Wt 187.7 lb

## 2012-06-02 DIAGNOSIS — Z1211 Encounter for screening for malignant neoplasm of colon: Secondary | ICD-10-CM

## 2012-06-02 MED ORDER — MOVIPREP 100 G PO SOLR: 1.0000 | Freq: Once | ORAL | Status: DC

## 2012-06-03 ENCOUNTER — Encounter: Payer: Self-pay | Admitting: Gastroenterology

## 2012-06-10 ENCOUNTER — Encounter: Payer: Self-pay | Admitting: Gastroenterology

## 2012-06-10 ENCOUNTER — Ambulatory Visit (AMBULATORY_SURGERY_CENTER): Payer: BC Managed Care – PPO | Admitting: Gastroenterology

## 2012-06-10 VITALS — BP 137/68 | HR 60 | Temp 98.4°F | Resp 16 | Ht 67.0 in | Wt 187.0 lb

## 2012-06-10 DIAGNOSIS — Z1211 Encounter for screening for malignant neoplasm of colon: Secondary | ICD-10-CM

## 2012-06-10 DIAGNOSIS — Z86010 Personal history of colonic polyps: Secondary | ICD-10-CM

## 2012-06-10 LAB — GLUCOSE, CAPILLARY
Glucose-Capillary: 96 mg/dL (ref 70–99)
Glucose-Capillary: 79 mg/dL (ref 70–99)
Glucose-Capillary: 96 mg/dL (ref 70–99)

## 2012-06-10 MED ORDER — SODIUM CHLORIDE 0.9 % IV SOLN: 500.0000 mL | INTRAVENOUS | Status: DC

## 2012-06-10 NOTE — Progress Notes (Signed)
Patient did not experience any of the following events: a burn prior to discharge; a fall within the facility; wrong site/side/patient/procedure/implant event; or a hospital transfer or hospital admission upon discharge from the facility. (G8907) Patient did not have preoperative order for IV antibiotic SSI prophylaxis. (G8918)  

## 2012-06-10 NOTE — Op Note (Signed)
[facility] [address] [place], [address]   COLONOSCOPY PROCEDURE REPORT  PATIENT: Victoria Gates, Victoria Gates  MR#: [id number] BIRTHDATE: 1951-09-06 , 61  yrs. old GENDER: Female ENDOSCOPIST: Mardella Layman, MD, [facility] REFERRED BY: PROCEDURE DATE:  06/10/2012 PROCEDURE:   Colonoscopy, surveillance ASA CLASS:   Class II INDICATIONS:patient's personal history of colon polyps. MEDICATIONS: Propofol (Diprivan) 110 mg IV  DESCRIPTION OF PROCEDURE:   After the risks and benefits and of the procedure were explained, informed consent was obtained.  A digital rectal exam revealed no abnormalities of the rectum.    The LB [id number]  endoscope was introduced through the anus and advanced to the cecum, which was identified by both the appendix and ileocecal valve .  The quality of the prep was excellent, using MoviPrep .  The instrument was then slowly withdrawn as the colon was fully examined.     COLON FINDINGS: The colonic mucosa appeared normal.   The colon was otherwise normal.  There was no diverticulosis, inflamation, polyps or cancers unless previously stated.     Retroflexed views revealed no abnormalities.     The scope was then withdrawn from the patient and the procedure completed.  COMPLICATIONS: There were no complications. ENDOSCOPIC IMPRESSION: 1.   The colonic mucosa appeared normal 2.   The colon was otherwise normal ..no polyps or cancer  RECOMMENDATIONS: Continue current colorectal screening recommendations for "routine risk" patients with a repeat colonoscopy in 10 years.   REPEAT EXAM:  cc:  _______________________________ eSignedMardella Layman, MD, [facility] 06/10/2012 9:24 AM

## 2012-06-10 NOTE — Patient Instructions (Signed)
YOU HAD AN ENDOSCOPIC PROCEDURE TODAY AT THE [facility]: Refer to the procedure report that was given to you for any specific questions about what was found during the examination.  If the procedure report does not answer your questions, please call your gastroenterologist to clarify.  If you requested that your care partner not be given the details of your procedure findings, then the procedure report has been included in a sealed envelope for you to review at your convenience later.  YOU SHOULD EXPECT: Some feelings of bloating in the abdomen. Passage of more gas than usual.  Walking can help get rid of the air that was put into your GI tract during the procedure and reduce the bloating. If you had a lower endoscopy (such as a colonoscopy or flexible sigmoidoscopy) you may notice spotting of blood in your stool or on the toilet paper. If you underwent a bowel prep for your procedure, then you may not have a normal bowel movement for a few days.  DIET: Your first meal following the procedure should be a light meal and then it is ok to progress to your normal diet.  A half-sandwich or bowl of soup is an example of a good first meal.  Heavy or fried foods are harder to digest and may make you feel nauseous or bloated.  Likewise meals heavy in dairy and vegetables can cause extra gas to form and this can also increase the bloating.  Drink plenty of fluids but you should avoid alcoholic beverages for 24 hours.  ACTIVITY: Your care partner should take you home directly after the procedure.  You should plan to take it easy, moving slowly for the rest of the day.  You can resume normal activity the day after the procedure however you should NOT DRIVE or use heavy machinery for 24 hours (because of the sedation medicines used during the test).    SYMPTOMS TO REPORT IMMEDIATELY: A gastroenterologist can be reached at any hour.  During normal business hours, 8:30 AM to 5:00 PM Monday through Friday,  call [phone].  After hours and on weekends, please call the GI answering service at [phone] who will take a message and have the physician on call contact you.   Following lower endoscopy (colonoscopy or flexible sigmoidoscopy):  Excessive amounts of blood in the stool  Significant tenderness or worsening of abdominal pains  Swelling of the abdomen that is new, acute  Fever of 100F or higher    FOLLOW UP: If any biopsies were taken you will be contacted by phone or by letter within the next 1-3 weeks.  Call your gastroenterologist if you have not heard about the biopsies in 3 weeks.  Our staff will call the home number listed on your records the next business day following your procedure to check on you and address any questions or concerns that you may have at that time regarding the information given to you following your procedure. This is a courtesy call and so if there is no answer at the home number and we have not heard from you through the emergency physician on call, we will assume that you have returned to your regular daily activities without incident.  SIGNATURES/CONFIDENTIALITY: You and/or your care partner have signed paperwork which will be entered into your electronic medical record.  These signatures attest to the fact that that the information above on your After Visit Summary has been reviewed and is understood.  Full responsibility of the confidentiality   of this discharge information lies with you and/or your care-partner.     

## 2012-06-11 ENCOUNTER — Telehealth: Payer: Self-pay

## 2012-06-11 NOTE — Telephone Encounter (Signed)
Left message on answering machine. 

## 2012-07-17 ENCOUNTER — Other Ambulatory Visit: Payer: Self-pay | Admitting: Gastroenterology

## 2012-07-17 ENCOUNTER — Other Ambulatory Visit: Payer: Self-pay | Admitting: Endocrinology

## 2013-01-20 ENCOUNTER — Other Ambulatory Visit: Payer: Self-pay

## 2013-01-20 MED ORDER — EZETIMIBE 10 MG PO TABS: ORAL_TABLET | ORAL | Status: DC

## 2013-04-06 ENCOUNTER — Encounter: Payer: BC Managed Care – PPO | Admitting: Endocrinology

## 2013-04-14 ENCOUNTER — Encounter: Payer: Self-pay | Admitting: Endocrinology

## 2013-04-14 ENCOUNTER — Ambulatory Visit (INDEPENDENT_AMBULATORY_CARE_PROVIDER_SITE_OTHER): Payer: BC Managed Care – PPO | Admitting: Endocrinology

## 2013-04-14 ENCOUNTER — Other Ambulatory Visit: Payer: Self-pay | Admitting: *Deleted

## 2013-04-14 VITALS — BP 118/74 | HR 89 | Temp 98.9°F | Resp 12 | Ht 67.0 in | Wt 186.0 lb

## 2013-04-14 DIAGNOSIS — E119 Type 2 diabetes mellitus without complications: Secondary | ICD-10-CM

## 2013-04-14 DIAGNOSIS — Z23 Encounter for immunization: Secondary | ICD-10-CM

## 2013-04-14 DIAGNOSIS — E7800 Pure hypercholesterolemia, unspecified: Secondary | ICD-10-CM

## 2013-04-14 DIAGNOSIS — E8809 Other disorders of plasma-protein metabolism, not elsewhere classified: Secondary | ICD-10-CM | POA: Insufficient documentation

## 2013-04-14 DIAGNOSIS — I10 Essential (primary) hypertension: Secondary | ICD-10-CM

## 2013-04-14 DIAGNOSIS — Z0000 Encounter for general adult medical examination without abnormal findings: Secondary | ICD-10-CM

## 2013-04-14 DIAGNOSIS — D509 Iron deficiency anemia, unspecified: Secondary | ICD-10-CM

## 2013-04-14 LAB — CBC WITH DIFFERENTIAL/PLATELET
Basophils Absolute: 0 10*3/uL (ref 0.0–0.1)
Lymphocytes Relative: 57.4 % — ABNORMAL HIGH (ref 12.0–46.0)
Monocytes Relative: 6.8 % (ref 3.0–12.0)
Neutrophils Relative %: 33.7 % — ABNORMAL LOW (ref 43.0–77.0)
Platelets: 202 10*3/uL (ref 150.0–400.0)
RDW: 14.4 % (ref 11.5–14.6)

## 2013-04-14 LAB — URINALYSIS, ROUTINE W REFLEX MICROSCOPIC
Leukocytes, UA: NEGATIVE
Nitrite: NEGATIVE
RBC / HPF: NONE SEEN (ref 0–?)
Specific Gravity, Urine: >=1.03 (ref 1.000–1.030)
Urobilinogen, UA: 0.2 (ref 0.0–1.0)
pH: 6 (ref 5.0–8.0)

## 2013-04-14 LAB — IBC PANEL
Iron: 60 ug/dL (ref 42–145)
Saturation Ratios: 28.9 % (ref 20.0–50.0)
Transferrin: 148.2 mg/dL — ABNORMAL LOW (ref 212.0–360.0)

## 2013-04-14 LAB — MICROALBUMIN / CREATININE URINE RATIO
Microalb Creat Ratio: 0.3 mg/g (ref 0.0–30.0)
Microalb, Ur: 1.1 mg/dL (ref 0.0–1.9)

## 2013-04-14 LAB — BASIC METABOLIC PANEL
Calcium: 9.7 mg/dL (ref 8.4–10.5)
Creatinine, Ser: 1 mg/dL (ref 0.4–1.2)
GFR: 75.62 mL/min (ref >60.00)
Glucose, Bld: 93 mg/dL (ref 70–99)
Sodium: 140 mEq/L (ref 135–145)

## 2013-04-14 LAB — LIPID PANEL
Total CHOL/HDL Ratio: 5
Triglycerides: 203 mg/dL — ABNORMAL HIGH (ref 0.0–149.0)
VLDL: 40.6 mg/dL — ABNORMAL HIGH (ref 0.0–40.0)

## 2013-04-14 LAB — TSH: TSH: 7.39 u[IU]/mL — ABNORMAL HIGH (ref 0.35–5.50)

## 2013-04-14 LAB — HEPATIC FUNCTION PANEL
AST: 23 U/L (ref 0–37)
Albumin: 3.3 g/dL — ABNORMAL LOW (ref 3.5–5.2)
Alkaline Phosphatase: 42 U/L (ref 39–117)
Bilirubin, Direct: 0 mg/dL (ref 0.0–0.3)

## 2013-04-14 LAB — HEMOGLOBIN A1C: Hgb A1c MFr Bld: 6.1 % (ref 4.6–6.5)

## 2013-04-14 MED ORDER — EZETIMIBE 10 MG PO TABS: ORAL_TABLET | ORAL | Status: DC

## 2013-04-14 NOTE — Patient Instructions (Addendum)
here are some tests for blood in the bowels.  please follow the instructions, and return to the lab downstairs please consider these measures for your health:  minimize alcohol.  do not use tobacco products.  have a colonoscopy at least every 10 years from age 62.  Women should have an annual mammogram from age 31.  keep firearms safely stored.  always use seat belts.  have working smoke alarms in your home.  see an eye doctor and dentist regularly.  never drive under the influence of alcohol or drugs (including prescription drugs).   blood tests are being requested for you today.  We'll contact you with results. Based on the results, we may be able to reduce your medications.   Please return in 1 year.

## 2013-04-14 NOTE — Progress Notes (Signed)
Subjective:    Patient ID: Victoria Gates, female    DOB: 1951/05/12, 62 y.o.   MRN: [id number]  HPI Pt is here for regular wellness examination, and is feeling pretty well in general, and says chronic med probs are stable, except as noted below Past Medical History  Diagnosis Date  . COLONIC POLYPS, HX OF 04/24/2007  . OSTEOPOROSIS 04/24/2007  . GERD 09/21/2010  . DM 03/18/2008  . HYPERCHOLESTEROLEMIA 07/03/2009  . ANEMIA-IRON DEFICIENCY 04/24/2007  . ALLERGIC RHINITIS CAUSE UNSPECIFIED 01/02/2009  . ESOPHAGEAL STRICTURE 04/15/2007    s/p dilitation  . HIATAL HERNIA 04/15/2007  . CERVICAL RADICULOPATHY, LEFT 02/01/2010  . 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 Chest pain 03/2012    a. 04/15/2012 Ex MV: Ex time 10 mins, EF 63%, no ischemia/infarct.  Occas PAC's/PVC's.    Past Surgical History  Procedure Laterality Date  . Tubal ligation    . Esophagogastroduodenoscopy  04/15/2007    History   Social History  . Marital Status: Married    Spouse Name: N/A    Number of Children: N/A  . Years of Education: N/A   Occupational History  . MAIL CLERK    Social History Main Topics  . Smoking status: Never Smoker   . Smokeless tobacco: Never Used  . Alcohol Use: No  . Drug Use: No  . Sexually Active: Not on file   Other Topics Concern  . Not on file   Social History Narrative  . No narrative on file    Current Outpatient Prescriptions on File Prior to Visit  Medication Sig Dispense Refill  . aspirin 81 MG tablet Take 81 mg by mouth daily.       . Calcium 500-125 MG-UNIT TABS Take 1 tablet by mouth daily.        . cholecalciferol (VITAMIN D) 1000 UNITS tablet Take 1,000 Units by mouth daily.      . fish oil-omega-3 fatty acids 1000 MG capsule Take 1 capsule by mouth every other day.        . lansoprazole (PREVACID) 30 MG capsule TAKE ONE CAPSULE BY MOUTH EVERY DAY 1 HOUR BEFORE MEALS  31 capsule  8  . metFORMIN (GLUCOPHAGE-XR) 500 MG 24 hr tablet TAKE 1 TABLET (500 MG TOTAL) BY MOUTH DAILY.   120 tablet  5  . colestipol (COLESTID) 1 G tablet Take 2 tablets (2 g total) by mouth daily.  60 tablet  11   No current facility-administered medications on file prior to visit.    Allergies  Allergen Reactions  . Atorvastatin     REACTION: myalgias  . Rosuvastatin     REACTION: myalgias  . Sulfamethoxazole     REACTION: unspecified  . Sulfonamide Derivatives     Family History  Problem Relation Age of Onset  . Cancer Neg Hx     No FH of Colon Cancer  . Colon cancer Neg Hx   . Esophageal cancer Neg Hx   . Rectal cancer Neg Hx   . Stomach cancer Neg Hx   . Brain cancer Mother   . Heart disease Mother   . Lung cancer Father   . Heart disease Father   . Diabetes Sister   . Heart disease Sister   . Diabetes Brother   . Heart disease Brother     BP 118/74  Pulse 89  Temp(Src) 98.9 F (37.2 C) (Oral)  Resp 12  Ht 5\' 7"  (1.702 m)  Wt 186 lb (84.369 kg)  BMI 29.12 kg/m2  SpO2 97%     Review of Systems  Constitutional: Negative for fever.       She has lost weight, due to her efforts  HENT: Negative for hearing loss.   Eyes: Negative for visual disturbance.  Respiratory: Negative for shortness of breath.   Cardiovascular: Negative for chest pain.  Gastrointestinal: Negative for anal bleeding.  Endocrine: Negative for cold intolerance.  Genitourinary: Negative for hematuria.  Musculoskeletal: Negative for back pain.  Skin: Negative for rash.  Allergic/Immunologic: Positive for environmental allergies.  Neurological: Negative for syncope and numbness.  Hematological: Does not bruise/bleed easily.  Psychiatric/Behavioral: Negative for dysphoric mood.       Objective:   Physical Exam VS: see vs page GEN: no distress HEAD: head: no deformity eyes: no periorbital swelling, no proptosis external nose and ears are normal mouth: no lesion seen NECK: supple, thyroid is not enlarged CHEST WALL: no deformity LUNGS:  Clear to auscultation BREASTS: sees gyn.    CV: reg rate and rhythm, no murmur ABD: abdomen is soft, nontender.  no hepatosplenomegaly.  not distended.  no hernia GENITALIA/RECTAL: sees gyn MUSCULOSKELETAL: muscle bulk and strength are grossly normal.  no obvious joint swelling.  gait is normal and steady EXTEMITIES: no deformity.  no ulcer on the feet.  feet are of normal color and temp.  no edema PULSES: dorsalis pedis intact bilat.  no carotid bruit NEURO:  cn 2-12 grossly intact.   readily moves all 4's.  sensation is intact to touch on the feet SKIN:  Normal texture and temperature.  No rash or suspicious lesion is visible.   NODES:  None palpable at the neck PSYCH: alert, oriented x3.  Does not appear anxious nor depressed.   Lab Results  Component Value Date   WBC 4.8 04/14/2013   HGB 10.5* 04/14/2013   HCT 30.9* 04/14/2013   PLT 202.0 04/14/2013   GLUCOSE 93 04/14/2013   CHOL 125 04/14/2013   TRIG 203.0* 04/14/2013   HDL 27.40* 04/14/2013   LDLDIRECT 70.7 04/14/2013   LDLCALC 92 04/06/2012   ALT 24 04/14/2013   AST 23 04/14/2013   NA 140 04/14/2013   K 3.8 04/14/2013   CL 105 04/14/2013   CREATININE 1.0 04/14/2013   BUN 10 04/14/2013   CO2 28 04/14/2013   TSH 7.39* 04/14/2013   INR 1.04 04/05/2012   HGBA1C 6.1 04/14/2013   MICROALBUR 1.1 04/14/2013      Assessment & Plan:  Wellness visit today, with problems stable, except as noted.  we discussed code status.  pt requests full code.

## 2013-04-16 ENCOUNTER — Telehealth: Payer: Self-pay | Admitting: *Deleted

## 2013-04-16 NOTE — Telephone Encounter (Signed)
Called pt and lvm telling her know that Dr Everardo All has put orders in for her to come in and have labs drawn.

## 2013-04-20 ENCOUNTER — Other Ambulatory Visit: Payer: BC Managed Care – PPO

## 2013-04-20 ENCOUNTER — Other Ambulatory Visit: Payer: Self-pay | Admitting: *Deleted

## 2013-04-20 DIAGNOSIS — D509 Iron deficiency anemia, unspecified: Secondary | ICD-10-CM

## 2013-04-20 DIAGNOSIS — E8809 Other disorders of plasma-protein metabolism, not elsewhere classified: Secondary | ICD-10-CM

## 2013-04-20 DIAGNOSIS — E119 Type 2 diabetes mellitus without complications: Secondary | ICD-10-CM

## 2013-04-20 DIAGNOSIS — E7800 Pure hypercholesterolemia, unspecified: Secondary | ICD-10-CM

## 2013-04-22 ENCOUNTER — Other Ambulatory Visit: Payer: Self-pay | Admitting: Endocrinology

## 2013-04-22 DIAGNOSIS — E8809 Other disorders of plasma-protein metabolism, not elsewhere classified: Secondary | ICD-10-CM

## 2013-04-22 LAB — PROTEIN ELECTROPHORESIS, SERUM
Alpha-1-Globulin: 2.3 % — ABNORMAL LOW (ref 2.9–4.9)
Gamma Globulin: 4.9 % — ABNORMAL LOW (ref 11.1–18.8)
M-Spike, %: 3.38 g/dL
Total Protein, Serum Electrophoresis: 8.5 g/dL — ABNORMAL HIGH (ref 6.0–8.3)

## 2013-04-27 ENCOUNTER — Other Ambulatory Visit (INDEPENDENT_AMBULATORY_CARE_PROVIDER_SITE_OTHER): Payer: BC Managed Care – PPO

## 2013-04-27 ENCOUNTER — Other Ambulatory Visit: Payer: Self-pay | Admitting: *Deleted

## 2013-04-27 DIAGNOSIS — K921 Melena: Secondary | ICD-10-CM

## 2013-04-27 DIAGNOSIS — D509 Iron deficiency anemia, unspecified: Secondary | ICD-10-CM

## 2013-04-27 LAB — HEMOCCULT SLIDES (X 3 CARDS)
OCCULT 1: NEGATIVE
OCCULT 2: NEGATIVE
OCCULT 4: NEGATIVE

## 2013-05-13 ENCOUNTER — Other Ambulatory Visit: Payer: Self-pay | Admitting: Gastroenterology

## 2013-05-13 ENCOUNTER — Other Ambulatory Visit: Payer: Self-pay

## 2013-05-13 MED ORDER — COLESTIPOL HCL 1 G PO TABS: 2.0000 g | ORAL_TABLET | Freq: Every day | ORAL | Status: DC

## 2013-05-13 NOTE — Telephone Encounter (Signed)
PLEASE MAKE AN OFFICE VISIT FOR FURTHER REFILLS  

## 2013-05-17 ENCOUNTER — Other Ambulatory Visit: Payer: Self-pay | Admitting: Gynecology

## 2013-06-15 ENCOUNTER — Other Ambulatory Visit: Payer: Self-pay

## 2013-06-15 MED ORDER — METFORMIN HCL ER 500 MG PO TB24: ORAL_TABLET | ORAL | Status: DC

## 2013-06-16 ENCOUNTER — Other Ambulatory Visit: Payer: Self-pay

## 2013-06-16 MED ORDER — METFORMIN HCL ER 500 MG PO TB24: ORAL_TABLET | ORAL | Status: DC

## 2013-06-23 ENCOUNTER — Telehealth: Payer: Self-pay | Admitting: Gastroenterology

## 2013-06-23 MED ORDER — LANSOPRAZOLE 30 MG PO CPDR: 30.0000 mg | DELAYED_RELEASE_CAPSULE | Freq: Every day | ORAL | Status: DC

## 2013-06-23 NOTE — Telephone Encounter (Signed)
Patient made an office visit appointment  Patient understands must keep office visit for further refills

## 2013-07-19 ENCOUNTER — Other Ambulatory Visit: Payer: Self-pay | Admitting: Gastroenterology

## 2013-07-20 ENCOUNTER — Encounter: Payer: Self-pay | Admitting: Gastroenterology

## 2013-07-20 ENCOUNTER — Ambulatory Visit (INDEPENDENT_AMBULATORY_CARE_PROVIDER_SITE_OTHER): Payer: BC Managed Care – PPO | Admitting: Gastroenterology

## 2013-07-20 VITALS — BP 100/70 | HR 78 | Ht 66.0 in | Wt 184.1 lb

## 2013-07-20 DIAGNOSIS — K219 Gastro-esophageal reflux disease without esophagitis: Secondary | ICD-10-CM

## 2013-07-20 MED ORDER — LANSOPRAZOLE 30 MG PO CPDR: 30.0000 mg | DELAYED_RELEASE_CAPSULE | Freq: Every day | ORAL | Status: DC

## 2013-07-20 NOTE — Patient Instructions (Signed)
We have sent the following medications to your pharmacy for you to pick up at your convenience: Lansoprazole   Gastroesophageal Reflux Disease, Adult Gastroesophageal reflux disease (GERD) happens when acid from your stomach flows up into the esophagus. When acid comes in contact with the esophagus, the acid causes soreness (inflammation) in the esophagus. Over time, GERD may create small holes (ulcers) in the lining of the esophagus. CAUSES   Increased body weight. This puts pressure on the stomach, making acid rise from the stomach into the esophagus.  Smoking. This increases acid production in the stomach.  Drinking alcohol. This causes decreased pressure in the lower esophageal sphincter (valve or ring of muscle between the esophagus and stomach), allowing acid from the stomach into the esophagus.  Late evening meals and a full stomach. This increases pressure and acid production in the stomach.  A malformed lower esophageal sphincter. Sometimes, no cause is found. SYMPTOMS   Burning pain in the lower part of the mid-chest behind the breastbone and in the mid-stomach area. This may occur twice a week or more often.  Trouble swallowing.  Sore throat.  Dry cough.  Asthma-like symptoms including chest tightness, shortness of breath, or wheezing. DIAGNOSIS  Your caregiver may be able to diagnose GERD based on your symptoms. In some cases, X-rays and other tests may be done to check for complications or to check the condition of your stomach and esophagus. TREATMENT  Your caregiver may recommend over-the-counter or prescription medicines to help decrease acid production. Ask your caregiver before starting or adding any new medicines.  HOME CARE INSTRUCTIONS   Change the factors that you can control. Ask your caregiver for guidance concerning weight loss, quitting smoking, and alcohol consumption.  Avoid foods and drinks that make your symptoms worse, such as:  Caffeine or  alcoholic drinks.  Chocolate.  Peppermint or mint flavorings.  Garlic and onions.  Spicy foods.  Citrus fruits, such as oranges, lemons, or limes.  Tomato-based foods such as sauce, chili, salsa, and pizza.  Fried and fatty foods.  Avoid lying down for the 3 hours prior to your bedtime or prior to taking a nap.  Eat small, frequent meals instead of large meals.  Wear loose-fitting clothing. Do not wear anything tight around your waist that causes pressure on your stomach.  Raise the head of your bed 6 to 8 inches with wood blocks to help you sleep. Extra pillows will not help.  Only take over-the-counter or prescription medicines for pain, discomfort, or fever as directed by your caregiver.  Do not take aspirin, ibuprofen, or other nonsteroidal anti-inflammatory drugs (NSAIDs). SEEK IMMEDIATE MEDICAL CARE IF:   You have pain in your arms, neck, jaw, teeth, or back.  Your pain increases or changes in intensity or duration.  You develop nausea, vomiting, or sweating (diaphoresis).  You develop shortness of breath, or you faint.  Your vomit is green, yellow, black, or looks like coffee grounds or blood.  Your stool is red, bloody, or black. These symptoms could be signs of other problems, such as heart disease, gastric bleeding, or esophageal bleeding. MAKE SURE YOU:   Understand these instructions.  Will watch your condition.  Will get help right away if you are not doing well or get worse. Document Released: 06/26/2005 Document Revised: 12/09/2011 Document Reviewed: 04/05/2011 [facility] Patient Information 2014 [place], [place].  CC:  Romero Belling MD

## 2013-07-20 NOTE — Progress Notes (Signed)
This is a 62 year old African American female who has had GERD for over 10 years will manage with lansoprazole 30 mg a day.  She denies dysphagia or any reflux symptoms.  She's had no complications from her medication, and underwent a recent normal colonoscopy.  She is followed by Dr. Everardo All as normal labs.  She specifically denies dysphagia, diarrhea, melena, hematochezia or any hepatobiliary complaints.  Her appetite is good her weight is stable  Current Medications, Allergies, Past Medical History, Past Surgical History, Family History and Social History were reviewed in [employer] record.  ROS: All systems were reviewed and are negative unless otherwise stated in the HPI.          Physical Exam: Blood pressure 100/70, pulse 78, weight 184.  She is a healthy-appearing female in no distress.  Chest is clear and she is in a regular rhythm without murmurs gallops or rubs.  I cannot appreciate hepatosplenomegaly, abdominal masses or tenderness.  Bowel sounds are normal.  Mental status is normal.  There no stigmata of chronic liver disease.  Lab review was normal    Assessment and Plan: Monica GERD with previous peptic stricture the esophagus.  I've advised her to continue lansoprazole 30 mg every morning with standard antireflux maneuvers.  I will see her on a when necessary basis in the future as needed.  She is continue other medications and follow up with primary care as scheduled

## 2013-10-06 ENCOUNTER — Ambulatory Visit (INDEPENDENT_AMBULATORY_CARE_PROVIDER_SITE_OTHER): Payer: BC Managed Care – PPO | Admitting: Endocrinology

## 2013-10-06 ENCOUNTER — Encounter: Payer: Self-pay | Admitting: Endocrinology

## 2013-10-06 VITALS — BP 120/80 | HR 69 | Temp 98.2°F | Ht 66.0 in | Wt 174.0 lb

## 2013-10-06 DIAGNOSIS — D509 Iron deficiency anemia, unspecified: Secondary | ICD-10-CM

## 2013-10-06 DIAGNOSIS — E119 Type 2 diabetes mellitus without complications: Secondary | ICD-10-CM

## 2013-10-06 DIAGNOSIS — Z23 Encounter for immunization: Secondary | ICD-10-CM

## 2013-10-06 DIAGNOSIS — E8809 Other disorders of plasma-protein metabolism, not elsewhere classified: Secondary | ICD-10-CM

## 2013-10-06 LAB — CBC WITH DIFFERENTIAL/PLATELET
Basophils Absolute: 0 10*3/uL (ref 0.0–0.1)
Basophils Relative: 0.4 % (ref 0.0–3.0)
EOS ABS: 0.1 10*3/uL (ref 0.0–0.7)
Eosinophils Relative: 1.1 % (ref 0.0–5.0)
LYMPHS PCT: 49.4 % — AB (ref 12.0–46.0)
Lymphs Abs: 2.4 10*3/uL (ref 0.7–4.0)
MCHC: 34.5 g/dL (ref 30.0–36.0)
MCV: 89.8 fl (ref 78.0–100.0)
Monocytes Absolute: 0.4 10*3/uL (ref 0.1–1.0)
Monocytes Relative: 7.6 % (ref 3.0–12.0)
NEUTROS ABS: 2 10*3/uL (ref 1.4–7.7)
NEUTROS PCT: 41.5 % — AB (ref 43.0–77.0)
PLATELETS: 202 10*3/uL (ref 150.0–400.0)
RBC: 2.4 Mil/uL — AB (ref 3.87–5.11)
RDW: 15.3 % — AB (ref 11.5–14.6)
WBC: 4.8 10*3/uL (ref 4.5–10.5)

## 2013-10-06 LAB — IBC PANEL
Iron: 46 ug/dL (ref 42–145)
Saturation Ratios: 25.8 % (ref 20.0–50.0)
TRANSFERRIN: 127.6 mg/dL — AB (ref 212.0–360.0)

## 2013-10-06 LAB — HEMOGLOBIN A1C: Hgb A1c MFr Bld: 5.8 % (ref 4.6–6.5)

## 2013-10-06 MED ORDER — PROMETHAZINE-CODEINE 6.25-10 MG/5ML PO SYRP: 5.0000 mL | ORAL_SOLUTION | ORAL | Status: DC | PRN

## 2013-10-06 MED ORDER — METHYLPREDNISOLONE (PAK) 4 MG PO TABS: ORAL_TABLET | ORAL | Status: DC

## 2013-10-06 NOTE — Progress Notes (Signed)
Subjective:    Patient ID: Victoria Gates, female    DOB: 11/11/1950, 63 y.o.   MRN: [id number]  HPI Pt states 1 week of slight dry-quality cough, and assoc nausea.  Pt says sxs are less now.   Past Medical History  Diagnosis Date  . COLONIC POLYPS, HX OF 04/24/2007  . OSTEOPOROSIS 04/24/2007  . GERD 09/21/2010  . DM 03/18/2008  . HYPERCHOLESTEROLEMIA 07/03/2009  . ANEMIA-IRON DEFICIENCY 04/24/2007  . ALLERGIC RHINITIS CAUSE UNSPECIFIED 01/02/2009  . ESOPHAGEAL STRICTURE 04/15/2007    s/p dilitation  . HIATAL HERNIA 04/15/2007  . CERVICAL RADICULOPATHY, LEFT 02/01/2010  . 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 Chest pain 03/2012    a. 04/15/2012 Ex MV: Ex time 10 mins, EF 63%, no ischemia/infarct.  Occas PAC's/PVC's.    Past Surgical History  Procedure Laterality Date  . Tubal ligation    . Esophagogastroduodenoscopy  04/15/2007    History   Social History  . Marital Status: Married    Spouse Name: N/A    Number of Children: N/A  . Years of Education: N/A   Occupational History  . MAIL CLERK    Social History Main Topics  . Smoking status: Never Smoker   . Smokeless tobacco: Never Used  . Alcohol Use: No  . Drug Use: No  . Sexual Activity: Not on file   Other Topics Concern  . Not on file   Social History Narrative  . No narrative on file    Current Outpatient Prescriptions on File Prior to Visit  Medication Sig Dispense Refill  . aspirin 81 MG tablet Take 81 mg by mouth daily.       . Calcium 500-125 MG-UNIT TABS Take 1 tablet by mouth daily.        . cholecalciferol (VITAMIN D) 1000 UNITS tablet Take 1,000 Units by mouth daily.      . colestipol (COLESTID) 1 G tablet Take 2 tablets (2 g total) by mouth daily.  60 tablet  11  . ezetimibe (ZETIA) 10 MG tablet TAKE 1 TABLET BY MOUTH EVERY DAY  30 tablet  4  . fish oil-omega-3 fatty acids 1000 MG capsule Take 1 capsule by mouth every other day.        . lansoprazole (PREVACID) 30 MG capsule Take 1 capsule (30 mg total) by mouth  daily.  30 capsule  11  . metFORMIN (GLUCOPHAGE-XR) 500 MG 24 hr tablet TAKE 1 TABLET (500 MG TOTAL) BY MOUTH DAILY.  120 tablet  5   No current facility-administered medications on file prior to visit.    Allergies  Allergen Reactions  . Atorvastatin     REACTION: myalgias  . Rosuvastatin     REACTION: myalgias  . Sulfamethoxazole     REACTION: unspecified  . Sulfonamide Derivatives     Family History  Problem Relation Age of Onset  . Cancer Neg Hx     No FH of Colon Cancer  . Colon cancer Neg Hx   . Esophageal cancer Neg Hx   . Rectal cancer Neg Hx   . Stomach cancer Neg Hx   . Brain cancer Mother   . Heart disease Mother   . Lung cancer Father   . Heart disease Father   . Diabetes Sister   . Heart disease Sister   . Diabetes Brother   . Heart disease Brother    BP 120/80  Pulse 69  Temp(Src) 98.2 F (36.8 C) (Oral)  Ht 5\' 6"  (1.676 m)  Wt 174 lb (78.926 kg)  BMI 28.10 kg/m2  SpO2 98%  Review of Systems Denies fever.  She has slight numbness and pain of the ulnar aspect of the right wrist.      Objective:   Physical Exam VITAL SIGNS:  See vs page GENERAL: no distress LUNGS:  Clear to auscultation RUE: Neuro: sensation is intact to touch, except decreased at the ulnar aspect.    Lab Results  Component Value Date   WBC 4.8 10/06/2013   HGB 7.4 Repeated and verified X2.* 10/06/2013   HCT 21.5 Repeated and verified X2.* 10/06/2013   MCV 89.8 10/06/2013   PLT 202.0 10/06/2013      Assessment & Plan:  Anemia: much worse. Due to normal fe level, there is a question of a false pos cbc result. Radicular sxs, uncertain etiology [place], improved.

## 2013-10-06 NOTE — Patient Instructions (Addendum)
blood tests are being requested for you today.  We'll contact you with results. Please come back for a regular physical appointment in 6 months.  Here is a prescription for cough and nausea (mixed together).   Also, here is a prescription for a steroid "pack."  Please call if this does not help the symptoms, so we can check the nerve-endings at a neurologist office.

## 2013-10-07 ENCOUNTER — Other Ambulatory Visit (INDEPENDENT_AMBULATORY_CARE_PROVIDER_SITE_OTHER): Payer: BC Managed Care – PPO

## 2013-10-07 ENCOUNTER — Other Ambulatory Visit: Payer: Self-pay

## 2013-10-07 ENCOUNTER — Telehealth: Payer: Self-pay | Admitting: Internal Medicine

## 2013-10-07 DIAGNOSIS — D509 Iron deficiency anemia, unspecified: Secondary | ICD-10-CM

## 2013-10-07 LAB — CBC WITH DIFFERENTIAL/PLATELET
Basophils Absolute: 0 10*3/uL (ref 0.0–0.1)
Basophils Relative: 0 % (ref 0.0–3.0)
Eosinophils Absolute: 0 10*3/uL (ref 0.0–0.7)
Eosinophils Relative: 0.3 % (ref 0.0–5.0)
HCT: 22.8 % — CL (ref 36.0–46.0)
Lymphocytes Relative: 40.6 % (ref 12.0–46.0)
Lymphs Abs: 3.2 10*3/uL (ref 0.7–4.0)
MCHC: 33.7 g/dL (ref 30.0–36.0)
MCV: 91.3 fl (ref 78.0–100.0)
Monocytes Absolute: 0.7 10*3/uL (ref 0.1–1.0)
Monocytes Relative: 8.4 % (ref 3.0–12.0)
NEUTROS ABS: 4.1 10*3/uL (ref 1.4–7.7)
Neutrophils Relative %: 50.7 % (ref 43.0–77.0)
Platelets: 194 10*3/uL (ref 150.0–400.0)
RBC: 2.5 Mil/uL — ABNORMAL LOW (ref 3.87–5.11)
RDW: 15.3 % — AB (ref 11.5–14.6)
WBC: 8 10*3/uL (ref 4.5–10.5)

## 2013-10-07 NOTE — Telephone Encounter (Signed)
I have seen the followup hgb today at 7.7, low but stable from the 1/7 result.    Chart notes reviewed  OK to let Dr Loanne Drilling f/u test result in AM, no further management needed at this time

## 2013-10-08 LAB — PROTEIN ELECTROPHORESIS, SERUM
Albumin ELP: 28.9 % — ABNORMAL LOW (ref 55.8–66.1)
Alpha-1-Globulin: 1.9 % — ABNORMAL LOW (ref 2.9–4.9)
Alpha-2-Globulin: 3.7 % — ABNORMAL LOW (ref 7.1–11.8)
BETA 2: 57 % — AB (ref 3.2–6.5)
BETA GLOBULIN: 6 % (ref 4.7–7.2)
Gamma Globulin: 2.5 % — ABNORMAL LOW (ref 11.1–18.8)
M-SPIKE, %: 5.77 g/dL
TOTAL PROTEIN, SERUM ELECTROPHOR: 10.9 g/dL — AB (ref 6.0–8.3)

## 2013-10-08 LAB — IMMUNOFIXATION ELECTROPHORESIS
IgA: 7070 mg/dL — ABNORMAL HIGH (ref 69–380)
IgG (Immunoglobin G), Serum: 386 mg/dL — ABNORMAL LOW (ref 690–1700)
IgM, Serum: 7 mg/dL — ABNORMAL LOW (ref 52–322)
Total Protein, Serum Electrophoresis: 10.9 g/dL — ABNORMAL HIGH (ref 6.0–8.3)

## 2013-10-11 ENCOUNTER — Telehealth: Payer: Self-pay | Admitting: Endocrinology

## 2013-10-11 ENCOUNTER — Encounter: Payer: Self-pay | Admitting: Endocrinology

## 2013-10-11 ENCOUNTER — Ambulatory Visit (INDEPENDENT_AMBULATORY_CARE_PROVIDER_SITE_OTHER): Payer: BC Managed Care – PPO | Admitting: Endocrinology

## 2013-10-11 VITALS — BP 112/76 | HR 72 | Temp 98.2°F | Ht 66.0 in | Wt 175.0 lb

## 2013-10-11 DIAGNOSIS — D509 Iron deficiency anemia, unspecified: Secondary | ICD-10-CM

## 2013-10-11 DIAGNOSIS — D472 Monoclonal gammopathy: Secondary | ICD-10-CM

## 2013-10-11 LAB — IBC PANEL
Iron: 52 ug/dL (ref 42–145)
SATURATION RATIOS: 30.6 % (ref 20.0–50.0)
TRANSFERRIN: 121.4 mg/dL — AB (ref 212.0–360.0)

## 2013-10-11 LAB — CBC WITH DIFFERENTIAL/PLATELET
BASOS ABS: 0 10*3/uL (ref 0.0–0.1)
BASOS PCT: 0.4 % (ref 0.0–3.0)
Eosinophils Absolute: 0 10*3/uL (ref 0.0–0.7)
Eosinophils Relative: 0.7 % (ref 0.0–5.0)
HCT: 22.3 % — CL (ref 36.0–46.0)
Hemoglobin: 7.5 g/dL — CL (ref 12.0–15.0)
LYMPHS PCT: 53.3 % — AB (ref 12.0–46.0)
Lymphs Abs: 3.1 10*3/uL (ref 0.7–4.0)
MCHC: 33.5 g/dL (ref 30.0–36.0)
MCV: 90.2 fl (ref 78.0–100.0)
Monocytes Absolute: 0.4 10*3/uL (ref 0.1–1.0)
Monocytes Relative: 6.8 % (ref 3.0–12.0)
NEUTROS PCT: 38.8 % — AB (ref 43.0–77.0)
Neutro Abs: 2.2 10*3/uL (ref 1.4–7.7)
Platelets: 210 10*3/uL (ref 150.0–400.0)
RDW: 15 % — AB (ref 11.5–14.6)
WBC: 5.8 10*3/uL (ref 4.5–10.5)

## 2013-10-11 LAB — LACTATE DEHYDROGENASE: LDH: 106 U/L (ref 94–250)

## 2013-10-11 NOTE — Telephone Encounter (Signed)
please call patient: i have refered you to a blood specialist.  you will receive a phone call, about a day and time for an appointment

## 2013-10-11 NOTE — Patient Instructions (Signed)
blood tests are being requested for you today.  We'll contact you with results.  

## 2013-10-11 NOTE — Progress Notes (Signed)
Subjective:    Patient ID: Victoria Gates, female    DOB: 09-Mar-1951, 63 y.o.   MRN: [id number]  HPI pt was noted last week to have severe anemia Pt says her radicular pain is now just mild, but not quite resolved.  No assoc numbness. Past Medical History  Diagnosis Date  . COLONIC POLYPS, HX OF 04/24/2007  . OSTEOPOROSIS 04/24/2007  . GERD 09/21/2010  . DM 03/18/2008  . HYPERCHOLESTEROLEMIA 07/03/2009  . ANEMIA-IRON DEFICIENCY 04/24/2007  . ALLERGIC RHINITIS CAUSE UNSPECIFIED 01/02/2009  . ESOPHAGEAL STRICTURE 04/15/2007    s/p dilitation  . HIATAL HERNIA 04/15/2007  . CERVICAL RADICULOPATHY, LEFT 02/01/2010  . 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 Chest pain 03/2012    a. 04/15/2012 Ex MV: Ex time 10 mins, EF 63%, no ischemia/infarct.  Occas PAC's/PVC's.    Past Surgical History  Procedure Laterality Date  . Tubal ligation    . Esophagogastroduodenoscopy  04/15/2007    History   Social History  . Marital Status: Married    Spouse Name: N/A    Number of Children: N/A  . Years of Education: N/A   Occupational History  . MAIL CLERK    Social History Main Topics  . Smoking status: Never Smoker   . Smokeless tobacco: Never Used  . Alcohol Use: No  . Drug Use: No  . Sexual Activity: Not on file   Other Topics Concern  . Not on file   Social History Narrative  . No narrative on file    Current Outpatient Prescriptions on File Prior to Visit  Medication Sig Dispense Refill  . aspirin 81 MG tablet Take 81 mg by mouth daily.       . Calcium 500-125 MG-UNIT TABS Take 1 tablet by mouth daily.        . cholecalciferol (VITAMIN D) 1000 UNITS tablet Take 1,000 Units by mouth daily.      . colestipol (COLESTID) 1 G tablet Take 2 tablets (2 g total) by mouth daily.  60 tablet  11  . ezetimibe (ZETIA) 10 MG tablet TAKE 1 TABLET BY MOUTH EVERY DAY  30 tablet  4  . fish oil-omega-3 fatty acids 1000 MG capsule Take 1 capsule by mouth every other day.        . lansoprazole (PREVACID) 30 MG  capsule Take 1 capsule (30 mg total) by mouth daily.  30 capsule  11  . metFORMIN (GLUCOPHAGE-XR) 500 MG 24 hr tablet TAKE 1 TABLET (500 MG TOTAL) BY MOUTH DAILY.  120 tablet  5  . methylPREDNIsolone (MEDROL DOSPACK) 4 MG tablet follow package directions  21 tablet  0  . promethazine-codeine (PHENERGAN WITH CODEINE) 6.25-10 MG/5ML syrup Take 5 mLs by mouth every 4 (four) hours as needed for cough.  240 mL  0   No current facility-administered medications on file prior to visit.    Allergies  Allergen Reactions  . Atorvastatin     REACTION: myalgias  . Rosuvastatin     REACTION: myalgias  . Sulfamethoxazole     REACTION: unspecified  . Sulfonamide Derivatives     Family History  Problem Relation Age of Onset  . Cancer Neg Hx     No FH of Colon Cancer  . Colon cancer Neg Hx   . Esophageal cancer Neg Hx   . Rectal cancer Neg Hx   . Stomach cancer Neg Hx   . Brain cancer Mother   . Heart disease Mother   . Lung cancer Father   .  Heart disease Father   . Diabetes Sister   . Heart disease Sister   . Diabetes Brother   . Heart disease Brother    BP 112/76  Pulse 72  Temp(Src) 98.2 F (36.8 C) (Oral)  Ht 5\' 6"  (1.676 m)  Wt 175 lb (79.379 kg)  BMI 28.26 kg/m2  SpO2 97%  Review of Systems Denies brbpr and hematuria    Objective:   Physical Exam VITAL SIGNS:  See vs page GENERAL: no distress Rectal: heme neg  Lab Results  Component Value Date   WBC 5.8 10/11/2013   HGB 7.5 Repeated and verified X2.* 10/11/2013   HCT 22.3 Repeated and verified X2.* 10/11/2013   MCV 90.2 10/11/2013   PLT 210.0 10/11/2013  (i also reviewed spep)    Assessment & Plan:  Radicular sxs, improved Severe anemia: she tolerates well.   Monoclonal protein, new.  Ref hematol.

## 2013-10-12 ENCOUNTER — Telehealth: Payer: Self-pay | Admitting: Internal Medicine

## 2013-10-12 ENCOUNTER — Telehealth: Payer: Self-pay

## 2013-10-12 NOTE — Telephone Encounter (Signed)
i need to know the caller's name

## 2013-10-12 NOTE — Telephone Encounter (Signed)
C/D 10/12/13 for appt. 10/13/13

## 2013-10-12 NOTE — Telephone Encounter (Signed)
Pt informed

## 2013-10-12 NOTE — Telephone Encounter (Signed)
S/w pt and gve np appt 01/14 @ 11 w/Dr. Julien Nordmann Referring Dr. [name] Dx- Monoclonal Paraproteinemia

## 2013-10-12 NOTE — Telephone Encounter (Signed)
i called.  We discussed

## 2013-10-12 NOTE — Telephone Encounter (Signed)
Pt's son's name is Chrissie Noa. Sorry I left that out.

## 2013-10-12 NOTE — Telephone Encounter (Signed)
Per pt request son called who stated he is a Dr in [place] is requesting a call from Dr to discuss issues with pt.  # is [phone]

## 2013-10-13 ENCOUNTER — Ambulatory Visit (HOSPITAL_BASED_OUTPATIENT_CLINIC_OR_DEPARTMENT_OTHER): Payer: BC Managed Care – PPO

## 2013-10-13 ENCOUNTER — Encounter: Payer: Self-pay | Admitting: Internal Medicine

## 2013-10-13 ENCOUNTER — Ambulatory Visit (HOSPITAL_BASED_OUTPATIENT_CLINIC_OR_DEPARTMENT_OTHER): Payer: Self-pay | Admitting: Internal Medicine

## 2013-10-13 ENCOUNTER — Other Ambulatory Visit: Payer: Self-pay | Admitting: Internal Medicine

## 2013-10-13 ENCOUNTER — Other Ambulatory Visit (HOSPITAL_BASED_OUTPATIENT_CLINIC_OR_DEPARTMENT_OTHER): Payer: Self-pay

## 2013-10-13 ENCOUNTER — Telehealth: Payer: Self-pay | Admitting: Internal Medicine

## 2013-10-13 VITALS — BP 159/92 | HR 82 | Temp 98.4°F | Resp 18 | Ht 66.0 in | Wt 175.1 lb

## 2013-10-13 DIAGNOSIS — D472 Monoclonal gammopathy: Secondary | ICD-10-CM

## 2013-10-13 DIAGNOSIS — M810 Age-related osteoporosis without current pathological fracture: Secondary | ICD-10-CM

## 2013-10-13 DIAGNOSIS — C9000 Multiple myeloma not having achieved remission: Secondary | ICD-10-CM

## 2013-10-13 DIAGNOSIS — E119 Type 2 diabetes mellitus without complications: Secondary | ICD-10-CM

## 2013-10-13 LAB — CBC WITH DIFFERENTIAL/PLATELET
BASO%: 0.4 % (ref 0.0–2.0)
Basophils Absolute: 0 10*3/uL (ref 0.0–0.1)
EOS ABS: 0.1 10*3/uL (ref 0.0–0.5)
EOS%: 1 % (ref 0.0–7.0)
HEMATOCRIT: 24.1 % — AB (ref 34.8–46.6)
HEMOGLOBIN: 8.2 g/dL — AB (ref 11.6–15.9)
LYMPH#: 2.7 10*3/uL (ref 0.9–3.3)
LYMPH%: 53.3 % — AB (ref 14.0–49.7)
MCH: 31.6 pg (ref 25.1–34.0)
MCHC: 34 g/dL (ref 31.5–36.0)
MCV: 92.9 fL (ref 79.5–101.0)
MONO#: 0.3 10*3/uL (ref 0.1–0.9)
MONO%: 6.8 % (ref 0.0–14.0)
NEUT%: 38.5 % (ref 38.4–76.8)
NEUTROS ABS: 1.9 10*3/uL (ref 1.5–6.5)
PLATELETS: 212 10*3/uL (ref 145–400)
RBC: 2.59 10*6/uL — ABNORMAL LOW (ref 3.70–5.45)
RDW: 14.9 % — ABNORMAL HIGH (ref 11.2–14.5)
WBC: 5 10*3/uL (ref 3.9–10.3)

## 2013-10-13 LAB — COMPREHENSIVE METABOLIC PANEL (CC13)
ALT: 17 U/L (ref 0–55)
AST: 18 U/L (ref 5–34)
Albumin: 3.1 g/dL — ABNORMAL LOW (ref 3.5–5.0)
Alkaline Phosphatase: 48 U/L (ref 40–150)
Anion Gap: 14 mEq/L — ABNORMAL HIGH (ref 3–11)
BILIRUBIN TOTAL: 0.39 mg/dL (ref 0.20–1.20)
BUN: 16.8 mg/dL (ref 7.0–26.0)
CALCIUM: 9.7 mg/dL (ref 8.4–10.4)
CHLORIDE: 102 meq/L (ref 98–109)
CO2: 25 mEq/L (ref 22–29)
CREATININE: 1.3 mg/dL — AB (ref 0.6–1.1)
GLUCOSE: 73 mg/dL (ref 70–140)
Potassium: 3.9 mEq/L (ref 3.5–5.1)
Sodium: 141 mEq/L (ref 136–145)
TOTAL PROTEIN: 11.2 g/dL — AB (ref 6.4–8.3)

## 2013-10-13 LAB — LACTATE DEHYDROGENASE (CC13): LDH: 101 U/L — ABNORMAL LOW (ref 125–245)

## 2013-10-13 NOTE — Telephone Encounter (Signed)
gv adn printed appt sched and avs for pt for Jan 2015 °

## 2013-10-13 NOTE — Progress Notes (Signed)
Patient had not been to [place] and no financial issues. She has appt card.

## 2013-10-13 NOTE — Patient Instructions (Signed)
Followup visit in 10 days after a bone marrow biopsy and aspirate as well as a skeletal bone survey.

## 2013-10-13 NOTE — Progress Notes (Signed)
[facility] [phone]   [phone]  CONSULT NOTE  REFERRING PHYSICIAN: Dr. 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  HPI Victoria Gates is a 63 y.o. female was past medical history significant for multiple medical problems including history of diabetes mellitus, dyslipidemia, GERD, esophageal stricture, hiatal hernia, anemia and questionable osteoporosis. The patient was seen recently by her primary care physician Dr. Loanne Drilling for routine evaluation CBC on 10/06/2013 showed a hemoglobin of 7.4 and hematocrit 21.5%. Repeat CBC on 10/11/2013 showed persistent anemia with hemoglobin of 7.5 and hematocrit 22.3%. Previous CBC 6 months ago on 04/14/2013 showed hemoglobin of 10.5 and hematocrit 30.9%. The patient has normal white blood count as well as normal platelet count. Iron study performed recently showed normal iron of 46, iron saturation 26%. During her evaluation for the anemia she had serum protein electrophoreses performed on 10/06/2013 which showed M spike of 5.77 and there was a restricted band consistent with monoclonal protein. The monoclonal protein peak accounts for 5.7 7D/DL the total 6.21 G./DL of protein and the beta-2 region. Total protein was also elevated at 10.9. Quantitative immunoglobulin on 10/06/2013 showed IgG 386, IgA 7070 and IgM 7.  Dr. Loanne Drilling kindly referred the patient to me today for further evaluation and recommendation regarding these abnormalities. The patient is feeling fine today with no specific complaints. She denied having any significant fatigue or weakness. She denied having any chest pain except for occasional left-sided rib cage pain, shortness of breath, cough or hemoptysis. She also has mild pain at the right little finger. The patient will start around 30 pounds over the last few months.  She denied having any significant nausea or vomiting. She  denied having any fever or chills. Family history significant for a mother with brain cancer, father with lung cancer, brother with lung cancer and a son who died at age 7 with a stomach cancer.  The patient is married and has 3 children, one deceased. Her son Yvone Neu is a physician at [employer] in [place], [place]. The patient is currently retired and used to work at [employer] [place]. She has no history of smoking, alcohol or drug abuse. HPI  Past Medical History  Diagnosis Date  . COLONIC POLYPS, HX OF 04/24/2007  . OSTEOPOROSIS 04/24/2007  . GERD 09/21/2010  . DM 03/18/2008  . HYPERCHOLESTEROLEMIA 07/03/2009  . ANEMIA-IRON DEFICIENCY 04/24/2007  . ALLERGIC RHINITIS CAUSE UNSPECIFIED 01/02/2009  . ESOPHAGEAL STRICTURE 04/15/2007    s/p dilitation  . HIATAL HERNIA 04/15/2007  . CERVICAL RADICULOPATHY, LEFT 02/01/2010  . 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 Chest pain 03/2012    a. 04/15/2012 Ex MV: Ex time 10 mins, EF 63%, no ischemia/infarct.  Occas PAC's/PVC's.    Past Surgical History  Procedure Laterality Date  . Tubal ligation    . Esophagogastroduodenoscopy  04/15/2007    Family History  Problem Relation Age of Onset  . Cancer Neg Hx     No FH of Colon Cancer  . Colon cancer Neg Hx   . Esophageal cancer Neg Hx   . Rectal cancer Neg Hx   . Stomach cancer Neg Hx   . Brain cancer Mother   . Heart disease Mother   . Lung cancer Father   . Heart disease Father   . Diabetes Sister   . Heart disease Sister   . Diabetes Brother   . Heart disease Brother  Social History History  Substance Use Topics  . Smoking status: Never Smoker   . Smokeless tobacco: Never Used  . Alcohol Use: No    Allergies  Allergen Reactions  . Atorvastatin     REACTION: myalgias  . Rosuvastatin     REACTION: myalgias  . Sulfamethoxazole     REACTION: unspecified  . Sulfonamide Derivatives     Current Outpatient Prescriptions  Medication Sig Dispense Refill  . aspirin 81 MG tablet  Take 81 mg by mouth daily.       . Calcium 500-125 MG-UNIT TABS Take 1 tablet by mouth daily.        . cholecalciferol (VITAMIN D) 1000 UNITS tablet Take 1,000 Units by mouth daily.      . colestipol (COLESTID) 1 G tablet Take 2 tablets (2 g total) by mouth daily.  60 tablet  11  . ezetimibe (ZETIA) 10 MG tablet TAKE 1 TABLET BY MOUTH EVERY DAY  30 tablet  4  . fish oil-omega-3 fatty acids 1000 MG capsule Take 1 capsule by mouth every other day.        . lansoprazole (PREVACID) 30 MG capsule Take 1 capsule (30 mg total) by mouth daily.  30 capsule  11  . metFORMIN (GLUCOPHAGE-XR) 500 MG 24 hr tablet TAKE 1 TABLET (500 MG TOTAL) BY MOUTH DAILY.  120 tablet  5  . methylPREDNIsolone (MEDROL DOSPACK) 4 MG tablet follow package directions  21 tablet  0  . promethazine-codeine (PHENERGAN WITH CODEINE) 6.25-10 MG/5ML syrup Take 5 mLs by mouth every 4 (four) hours as needed for cough.  240 mL  0   No current facility-administered medications for this visit.    Review of Systems  Constitutional: negative Eyes: negative Ears, nose, mouth, throat, and face: negative Respiratory: positive for pleurisy/chest pain Cardiovascular: negative Gastrointestinal: negative Genitourinary:negative Integument/breast: negative Hematologic/lymphatic: negative Musculoskeletal:positive for Pain in the right little finger. Neurological: negative Behavioral/Psych: negative Endocrine: negative Allergic/Immunologic: negative  Physical Exam  AXK:PVVZS, healthy, no distress, well nourished and well developed SKIN: skin color, texture, turgor are normal, no rashes or significant lesions HEAD: Normocephalic, No masses, lesions, tenderness or abnormalities EYES: normal, PERRLA EARS: External ears normal, Canals clear OROPHARYNX:no exudate, no erythema and lips, buccal mucosa, and tongue normal  NECK: supple, no adenopathy, no JVD LYMPH:  no palpable lymphadenopathy, no hepatosplenomegaly BREAST:breasts appear  normal, no suspicious masses, no skin or nipple changes or axillary nodes, not examined LUNGS: clear to auscultation , and palpation HEART: regular rate & rhythm and no murmurs ABDOMEN:abdomen soft, non-tender, normal bowel sounds and no masses or organomegaly BACK: Back symmetric, no curvature., No CVA tenderness EXTREMITIES:no joint deformities, effusion, or inflammation, no edema, no skin discoloration  NEURO: alert & oriented x 3 with fluent speech, no focal motor/sensory deficits  PERFORMANCE STATUS: ECOG 1  LABORATORY DATA: Lab Results  Component Value Date   WBC 5.0 10/13/2013   HGB 8.2* 10/13/2013   HCT 24.1* 10/13/2013   MCV 92.9 10/13/2013   PLT 212 10/13/2013      Chemistry      Component Value Date/Time   NA 141 10/13/2013 1111   NA 140 04/14/2013 0916   K 3.9 10/13/2013 1111   K 3.8 04/14/2013 0916   CL 105 04/14/2013 0916   CO2 25 10/13/2013 1111   CO2 28 04/14/2013 0916   BUN 16.8 10/13/2013 1111   BUN 10 04/14/2013 0916   CREATININE 1.3* 10/13/2013 1111   CREATININE 1.0 04/14/2013 0916  Component Value Date/Time   CALCIUM 9.7 10/13/2013 1111   CALCIUM 9.7 04/14/2013 0916   CALCIUM 9.0 12/18/2009 2256   ALKPHOS 48 10/13/2013 1111   ALKPHOS 42 04/14/2013 0916   AST 18 10/13/2013 1111   AST 23 04/14/2013 0916   ALT 17 10/13/2013 1111   ALT 24 04/14/2013 0916   BILITOT 0.39 10/13/2013 1111   BILITOT 0.4 04/14/2013 0916       RADIOGRAPHIC STUDIES: No results found.  ASSESSMENT: This is a very pleasant 63 years old [place] American female presented for evaluation of monoclonal paraproteinemia with elevated IgA. This is highly suspicious for multiple myeloma.   PLAN: I had a lengthy discussion with the patient and her husband today. I also called her son, Yvone Neu who is a physician in [place], [place].  I explained to the patient that the findings from her previous protein studies are highly suspicious for IgA multiple myeloma. I ordered several studies today for  evaluation of her condition including repeat CBC, comprehensive metabolic panel, LDH and myeloma panel. I would also consider the patient for 24 hour urine protein electrophoreses. I strongly recommend for the patient to have a skeletal bone survey as well as a bone marrow biopsy and aspirate for confirmation of her diagnosis. The patient is currently symptomatic from her condition with the severe anemia as well as the mild renal insufficiency. Once diagnosis is confirmed, I would consider the patient for induction chemotherapy with Revlimid, Velcade and Decadron. The patient may also be a good candidate for autologous peripheral blood stem cell transplant after the induction therapy. This will be discussed in more detail with the patient and her family and her upcoming visit in about 10 days after completion of the diagnostic workup. I gave the patient and her family the time to ask questions and I answered them completely to their satisfactions. She was advised to call immediately if she has any concerning symptoms in the interval.  The patient voices understanding of current disease status and treatment options and is in agreement with the current care plan.  All questions were answered. The patient knows to call the clinic with any problems, questions or concerns. We can certainly see the patient much sooner if necessary.  Thank you so much for allowing me to participate in the care of Sherlie Ban. I will continue to follow up the patient with you and assist in her care.  I spent 40 minutes counseling the patient face to face. The total time spent in the appointment was 60 minutes.  This note was dictated with voice recognition software. Similar sounding words can inadvertently be transcribed and may not be corrected upon review.   Uldine Fuster K. 10/13/2013, 12:26 PM

## 2013-10-14 ENCOUNTER — Other Ambulatory Visit: Payer: Self-pay | Admitting: Radiology

## 2013-10-14 LAB — KAPPA/LAMBDA LIGHT CHAINS
KAPPA FREE LGHT CHN: 0.85 mg/dL (ref 0.33–1.94)
Kappa:Lambda Ratio: 0.02 — ABNORMAL LOW (ref 0.26–1.65)
Lambda Free Lght Chn: 48.4 mg/dL — ABNORMAL HIGH (ref 0.57–2.63)

## 2013-10-14 LAB — IGG, IGA, IGM
IgA: 7030 mg/dL — ABNORMAL HIGH (ref 69–380)
IgG (Immunoglobin G), Serum: 402 mg/dL — ABNORMAL LOW (ref 690–1700)
IgM, Serum: 5 mg/dL — ABNORMAL LOW (ref 52–322)

## 2013-10-14 LAB — BETA 2 MICROGLOBULIN, SERUM: Beta-2 Microglobulin: 3.71 mg/L — ABNORMAL HIGH (ref 1.01–1.73)

## 2013-10-15 ENCOUNTER — Other Ambulatory Visit: Payer: Self-pay | Admitting: Radiology

## 2013-10-15 ENCOUNTER — Encounter (HOSPITAL_COMMUNITY): Payer: Self-pay | Admitting: Pharmacy Technician

## 2013-10-15 ENCOUNTER — Ambulatory Visit (HOSPITAL_COMMUNITY)
Admission: RE | Admit: 2013-10-15 | Discharge: 2013-10-15 | Disposition: A | Discharge status: Home / Self Care | Payer: BC Managed Care – PPO | Source: Ambulatory Visit | Attending: Internal Medicine | Admitting: Internal Medicine

## 2013-10-15 DIAGNOSIS — M479 Spondylosis, unspecified: Secondary | ICD-10-CM | POA: Insufficient documentation

## 2013-10-15 DIAGNOSIS — M51379 Other intervertebral disc degeneration, lumbosacral region without mention of lumbar back pain or lower extremity pain: Secondary | ICD-10-CM | POA: Insufficient documentation

## 2013-10-15 DIAGNOSIS — M4130 Thoracogenic scoliosis, site unspecified: Secondary | ICD-10-CM | POA: Insufficient documentation

## 2013-10-15 DIAGNOSIS — R079 Chest pain, unspecified: Secondary | ICD-10-CM | POA: Insufficient documentation

## 2013-10-15 DIAGNOSIS — M5380 Other specified dorsopathies, site unspecified: Secondary | ICD-10-CM | POA: Insufficient documentation

## 2013-10-15 DIAGNOSIS — C9000 Multiple myeloma not having achieved remission: Secondary | ICD-10-CM

## 2013-10-15 DIAGNOSIS — M5137 Other intervertebral disc degeneration, lumbosacral region: Secondary | ICD-10-CM | POA: Insufficient documentation

## 2013-10-19 ENCOUNTER — Other Ambulatory Visit: Payer: Self-pay | Admitting: *Deleted

## 2013-10-19 ENCOUNTER — Ambulatory Visit (HOSPITAL_COMMUNITY)
Admission: RE | Admit: 2013-10-19 | Discharge: 2013-10-19 | Disposition: A | Discharge status: Home / Self Care | Payer: BC Managed Care – PPO | Source: Ambulatory Visit | Attending: Internal Medicine | Admitting: Internal Medicine

## 2013-10-19 ENCOUNTER — Encounter (HOSPITAL_COMMUNITY): Payer: Self-pay

## 2013-10-19 DIAGNOSIS — C9030 Solitary plasmacytoma not having achieved remission: Secondary | ICD-10-CM | POA: Insufficient documentation

## 2013-10-19 DIAGNOSIS — Z79899 Other long term (current) drug therapy: Secondary | ICD-10-CM | POA: Insufficient documentation

## 2013-10-19 DIAGNOSIS — E119 Type 2 diabetes mellitus without complications: Secondary | ICD-10-CM | POA: Insufficient documentation

## 2013-10-19 DIAGNOSIS — M810 Age-related osteoporosis without current pathological fracture: Secondary | ICD-10-CM | POA: Insufficient documentation

## 2013-10-19 DIAGNOSIS — C9000 Multiple myeloma not having achieved remission: Secondary | ICD-10-CM

## 2013-10-19 DIAGNOSIS — E7800 Pure hypercholesterolemia, unspecified: Secondary | ICD-10-CM | POA: Insufficient documentation

## 2013-10-19 DIAGNOSIS — D649 Anemia, unspecified: Secondary | ICD-10-CM | POA: Insufficient documentation

## 2013-10-19 DIAGNOSIS — Z7982 Long term (current) use of aspirin: Secondary | ICD-10-CM | POA: Insufficient documentation

## 2013-10-19 DIAGNOSIS — K219 Gastro-esophageal reflux disease without esophagitis: Secondary | ICD-10-CM | POA: Insufficient documentation

## 2013-10-19 DIAGNOSIS — D472 Monoclonal gammopathy: Secondary | ICD-10-CM | POA: Insufficient documentation

## 2013-10-19 DIAGNOSIS — D72822 Plasmacytosis: Secondary | ICD-10-CM | POA: Insufficient documentation

## 2013-10-19 LAB — CBC
HCT: 24.4 % — ABNORMAL LOW (ref 36.0–46.0)
HEMOGLOBIN: 8.1 g/dL — AB (ref 12.0–15.0)
MCH: 30.5 pg (ref 26.0–34.0)
MCHC: 33.2 g/dL (ref 30.0–36.0)
MCV: 91.7 fL (ref 78.0–100.0)
Platelets: 178 10*3/uL (ref 150–400)
RBC: 2.66 MIL/uL — ABNORMAL LOW (ref 3.87–5.11)
RDW: 15.3 % (ref 11.5–15.5)
WBC: 4.9 10*3/uL (ref 4.0–10.5)

## 2013-10-19 LAB — APTT: APTT: 37 s (ref 24–37)

## 2013-10-19 LAB — PROTIME-INR
INR: 1.36 (ref 0.00–1.49)
PROTHROMBIN TIME: 16.4 s — AB (ref 11.6–15.2)

## 2013-10-19 LAB — GLUCOSE, CAPILLARY: Glucose-Capillary: 88 mg/dL (ref 70–99)

## 2013-10-19 MED ORDER — MIDAZOLAM HCL 2 MG/2ML IJ SOLN
INTRAMUSCULAR | Status: AC | PRN
  Administered 2013-10-19: 1 mg via INTRAVENOUS
  Administered 2013-10-19 (×2): 0.5 mg via INTRAVENOUS
  Administered 2013-10-19: 1 mg via INTRAVENOUS

## 2013-10-19 MED ORDER — FENTANYL CITRATE 0.05 MG/ML IJ SOLN
INTRAMUSCULAR | Status: AC
  Filled 2013-10-19: qty 6

## 2013-10-19 MED ORDER — MIDAZOLAM HCL 2 MG/2ML IJ SOLN
INTRAMUSCULAR | Status: AC
  Filled 2013-10-19: qty 6

## 2013-10-19 MED ORDER — SODIUM CHLORIDE 0.9 % IV SOLN
Freq: Once | INTRAVENOUS | Status: AC
  Administered 2013-10-19: 20 mL/h via INTRAVENOUS

## 2013-10-19 MED ORDER — FENTANYL CITRATE 0.05 MG/ML IJ SOLN
INTRAMUSCULAR | Status: AC | PRN
  Administered 2013-10-19 (×2): 50 ug via INTRAVENOUS

## 2013-10-19 NOTE — Discharge Instructions (Signed)
Moderate Sedation, Adult °Moderate sedation is given to help you relax or even sleep through a procedure. You may remain sleepy, be clumsy, or have poor balance for several hours following this procedure. Arrange for a responsible adult, family member, or friend to take you home. A responsible adult should stay with you for at least 24 hours or until the medicines have worn off. °· Do not participate in any activities where you could become injured for the next 24 hours, or until you feel normal again. Do not: °· Drive. °· Swim. °· Ride a bicycle. °· Operate heavy machinery. °· Cook. °· Use power tools. °· Climb ladders. °· Work at heights. °· Do not make important decisions or sign legal documents until you are improved. °· Vomiting may occur if you eat too soon. When you can drink without vomiting, try water, juice, or soup. Try solid foods if you feel little or no nausea. °· Only take over-the-counter or prescription medications for pain, discomfort, or fever as directed by your caregiver.If pain medications have been prescribed for you, ask your caregiver how soon it is safe to take them. °· Make sure you and your family fully understands everything about the medication given to you. Make sure you understand what side effects may occur. °· You should not drink alcohol, take sleeping pills, or medications that cause drowsiness for at least 24 hours. °· If you smoke, do not smoke alone. °· If you are feeling better, you may resume normal activities 24 hours after receiving sedation. °· Keep all appointments as scheduled. Follow all instructions. °· Ask questions if you do not understand. °SEEK MEDICAL CARE IF:  °· Your skin is pale or bluish in color. °· You continue to feel sick to your stomach (nauseous) or throw up (vomit). °· Your pain is getting worse and not helped by medication. °· You have bleeding or swelling. °· You are still sleepy or feeling clumsy after 24 hours. °SEEK IMMEDIATE MEDICAL CARE IF:   °· You develop a rash. °· You have difficulty breathing. °· You develop any type of allergic problem. °· You have a fever. °Document Released: 06/11/2001 Document Revised: 12/09/2011 Document Reviewed: 05/24/2013 °ExitCare® Patient Information ©2014 [employer]. °Bone Marrow Aspiration, Bone Marrow Biopsy °Care After °Read the instructions outlined below and refer to this sheet in the next few weeks. These discharge instructions provide you with general information on caring for yourself after you leave the hospital. Your caregiver may also give you specific instructions. While your treatment has been planned according to the most current medical practices available, unavoidable complications occasionally occur. If you have any problems or questions after discharge, call your caregiver. °FINDING OUT THE RESULTS OF YOUR TEST °Not all test results are available during your visit. If your test results are not back during the visit, make an appointment with your caregiver to find out the results. Do not assume everything is normal if you have not heard from your caregiver or the medical facility. It is important for you to follow up on all of your test results.  °HOME CARE INSTRUCTIONS  °You have had sedation and may be sleepy or dizzy. Your thinking may not be as clear as usual. For the next 24 hours: °· Only take over-the-counter or prescription medicines for pain, discomfort, and or fever as directed by your caregiver. °· Do not drink alcohol. °· Do not smoke. °· Do not drive. °· Do not make important legal decisions. °· Do not operate heavy machinery. °·   Do not care for small children by yourself. °· Keep your dressing clean and dry. You may replace dressing with a bandage after 24 hours. °· You may take a bath or shower after 24 hours. °· Use an ice pack for 20 minutes every 2 hours while awake for pain as needed. °SEEK MEDICAL CARE IF:  °· There is redness, swelling, or increasing pain at the biopsy  site. °· There is pus coming from the biopsy site. °· There is drainage from a biopsy site lasting longer than one day. °· An unexplained oral temperature above 102° F (38.9° C) develops. °SEEK IMMEDIATE MEDICAL CARE IF:  °· You develop a rash. °· You have difficulty breathing. °· You develop any reaction or side effects to medications given. °Document Released: 04/05/2005 Document Revised: 12/09/2011 Document Reviewed: 09/13/2008 °ExitCare® Patient Information ©2014 [employer]. ° °

## 2013-10-19 NOTE — H&P (Signed)
Chief Complaint: "I'm here for a bone marrow biopsy" Referring Physician:Mohamed HPI: Victoria Gates is an 63 y.o. 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 She is referred for bone marrow biopsy. PMHx and meds reviewed. Pt feeling okay otherwise, no recent fevers, illness.   Past Medical History:  Past Medical History  Diagnosis Date  . COLONIC POLYPS, HX OF 04/24/2007  . OSTEOPOROSIS 04/24/2007  . GERD 09/21/2010  . DM 03/18/2008  . HYPERCHOLESTEROLEMIA 07/03/2009  . ANEMIA-IRON DEFICIENCY 04/24/2007  . ALLERGIC RHINITIS CAUSE UNSPECIFIED 01/02/2009  . ESOPHAGEAL STRICTURE 04/15/2007    s/p dilitation  . HIATAL HERNIA 04/15/2007  . CERVICAL RADICULOPATHY, LEFT 02/01/2010  . 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 Chest pain 03/2012    a. 04/15/2012 Ex MV: Ex time 10 mins, EF 63%, no ischemia/infarct.  Occas PAC's/PVC's.    Past Surgical History:  Past Surgical History  Procedure Laterality Date  . Tubal ligation    . Esophagogastroduodenoscopy  04/15/2007    Family History:  Family History  Problem Relation Age of Onset  . Cancer Neg Hx     No FH of Colon Cancer  . Colon cancer Neg Hx   . Esophageal cancer Neg Hx   . Rectal cancer Neg Hx   . Stomach cancer Neg Hx   . Brain cancer Mother   . Heart disease Mother   . Lung cancer Father   . Heart disease Father   . Diabetes Sister   . Heart disease Sister   . Diabetes Brother   . Heart disease Brother     Social History:  reports that she has never smoked. She has never used smokeless tobacco. She reports that she does not drink alcohol or use illicit drugs.  Allergies:  Allergies  Allergen Reactions  . Atorvastatin     REACTION: myalgias  . Rosuvastatin     REACTION: myalgias  . Sulfamethoxazole     REACTION: unspecified  . Sulfonamide Derivatives     Medications:   Medication List    ASK your doctor about these medications       aspirin 81 MG tablet  Take 81 mg by mouth  daily.     Calcium 500-125 MG-UNIT Tabs  Take 1 tablet by mouth daily.     cholecalciferol 1000 UNITS tablet  Commonly known as:  VITAMIN D  Take 1,000 Units by mouth daily.     colestipol 1 G tablet  Commonly known as:  COLESTID  Take 1 g by mouth 2 (two) times daily.     ezetimibe 10 MG tablet  Commonly known as:  ZETIA  Take 10 mg by mouth every evening.     fish oil-omega-3 fatty acids 1000 MG capsule  Take 1 capsule by mouth daily.     lansoprazole 30 MG capsule  Commonly known as:  PREVACID  Take 30 mg by mouth daily at 12 noon.     metFORMIN 500 MG (MOD) 24 hr tablet  Commonly known as:  GLUMETZA  Take 500 mg by mouth every evening.     promethazine-codeine 6.25-10 MG/5ML syrup  Commonly known as:  PHENERGAN with CODEINE  Take 5 mLs by mouth every 6 (six) hours as needed for cough.        Please HPI for pertinent positives, otherwise complete 10 system ROS negative.  Physical Exam: BP 156/95  Pulse 72  Temp(Src) 97.2 F (36.2 C) (Oral)  Resp 16  SpO2 100% There is no weight on file  to calculate BMI.   General Appearance:  Alert, cooperative, no distress, appears stated age  Head:  Normocephalic, without obvious abnormality, atraumatic  ENT: Unremarkable  Neck: Supple, symmetrical, trachea midline  Lungs:   Clear to auscultation bilaterally, no w/r/r, respirations unlabored without use of accessory muscles.  Chest Wall:  No tenderness or deformity  Heart:  Regular rate and rhythm, S1, S2 normal, no murmur, rub or gallop.  Abdomen:   Soft, non-tender, non distended.  Neurologic: Normal affect, no gross deficits.   Results for orders placed during the hospital encounter of 10/19/13 (from the past 48 hour(s))  APTT     Status: None   Collection Time    10/19/13  9:30 AM      Result Value Range   aPTT 37  24 - 37 seconds   Comment:            IF BASELINE aPTT IS ELEVATED,     SUGGEST PATIENT RISK ASSESSMENT     BE USED TO DETERMINE APPROPRIATE      ANTICOAGULANT THERAPY.  CBC     Status: Abnormal   Collection Time    10/19/13  9:30 AM      Result Value Range   WBC 4.9  4.0 - 10.5 K/uL   RBC 2.66 (*) 3.87 - 5.11 MIL/uL   Hemoglobin 8.1 (*) 12.0 - 15.0 g/dL   HCT 24.4 (*) 36.0 - 46.0 %   MCV 91.7  78.0 - 100.0 fL   MCH 30.5  26.0 - 34.0 pg   MCHC 33.2  30.0 - 36.0 g/dL   RDW 15.3  11.5 - 15.5 %   Platelets 178  150 - 400 K/uL  PROTIME-INR     Status: Abnormal   Collection Time    10/19/13  9:30 AM      Result Value Range   Prothrombin Time 16.4 (*) 11.6 - 15.2 seconds   INR 1.36  0.00 - 1.49   No results found.  Assessment/Plan Monoclonal paraproteinemia suspicious for myeloma. For CT guided bone marrow biopsy. Explained procedure, risks, complications, use of sedation. Labs reviewed. Consent signed in chart  Ascencion Dike PA-C 10/19/2013, 10:08 AM

## 2013-10-19 NOTE — Procedures (Signed)
R iliac BM aspirate No comp

## 2013-10-20 ENCOUNTER — Telehealth: Payer: Self-pay | Admitting: Internal Medicine

## 2013-10-20 NOTE — Telephone Encounter (Signed)
s.w. pt and advised on time change for 1.23.15.Marland KitchenMarland Kitchenpt ok adn aware

## 2013-10-22 ENCOUNTER — Ambulatory Visit: Payer: BC Managed Care – PPO | Admitting: Internal Medicine

## 2013-10-22 ENCOUNTER — Ambulatory Visit (HOSPITAL_BASED_OUTPATIENT_CLINIC_OR_DEPARTMENT_OTHER): Payer: Self-pay | Admitting: Internal Medicine

## 2013-10-22 ENCOUNTER — Encounter: Payer: Self-pay | Admitting: Internal Medicine

## 2013-10-22 ENCOUNTER — Telehealth: Payer: Self-pay | Admitting: Oncology

## 2013-10-22 ENCOUNTER — Telehealth: Payer: Self-pay | Admitting: Internal Medicine

## 2013-10-22 VITALS — BP 154/75 | HR 86 | Temp 97.8°F | Resp 18 | Ht 66.0 in | Wt 172.1 lb

## 2013-10-22 DIAGNOSIS — C9000 Multiple myeloma not having achieved remission: Secondary | ICD-10-CM

## 2013-10-22 NOTE — Telephone Encounter (Signed)
Faxed pt medical records to Dr. Melba Coon office.  They will call pt with appt.  Pt is aware

## 2013-10-22 NOTE — Patient Instructions (Signed)
Referral to [facility] for second opinion.  Followup visit in 2 weeks for evaluation and discussion of her treatment options I

## 2013-10-22 NOTE — Progress Notes (Signed)
[facility] [phone]   [phone]  OFFICE PROGRESS NOTE  [name], MD 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  INTERVAL HISTORY: Victoria Gates 63 y.o. female returns to the clinic today for follow up visit accompanied by her husband. The patient is feeling fine today with no specific complaints except for mild fatigue. She recently under went several investigation for questionable multiple myeloma including bone marrow biopsy and aspirate in addition to repeat myeloma panel. She is here today for evaluation and discussion of her lab and biopsy results. She denied having any significant chest pain, shortness breath, cough or hemoptysis. The patient denied having any significant weight loss or night sweats.  MEDICAL HISTORY: Past Medical History  Diagnosis Date  . COLONIC POLYPS, HX OF 04/24/2007  . OSTEOPOROSIS 04/24/2007  . GERD 09/21/2010  . DM 03/18/2008  . HYPERCHOLESTEROLEMIA 07/03/2009  . ANEMIA-IRON DEFICIENCY 04/24/2007  . ALLERGIC RHINITIS CAUSE UNSPECIFIED 01/02/2009  . ESOPHAGEAL STRICTURE 04/15/2007    s/p dilitation  . HIATAL HERNIA 04/15/2007  . CERVICAL RADICULOPATHY, LEFT 02/01/2010  . 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 Chest pain 03/2012    a. 04/15/2012 Ex MV: Ex time 10 mins, EF 63%, no ischemia/infarct.  Occas PAC's/PVC's.    ALLERGIES:  is allergic to atorvastatin; rosuvastatin; sulfamethoxazole; and sulfonamide derivatives.  MEDICATIONS:  Current Outpatient Prescriptions  Medication Sig Dispense Refill  . aspirin 81 MG tablet Take 81 mg by mouth daily.       . Calcium 500-125 MG-UNIT TABS Take 1 tablet by mouth daily.        . cholecalciferol (VITAMIN D) 1000 UNITS tablet Take 1,000 Units by mouth daily.      . colestipol (COLESTID) 1 G tablet Take 1 g by mouth 2 (two) times daily.      Marland Kitchen ezetimibe  (ZETIA) 10 MG tablet Take 10 mg by mouth every evening.      . fish oil-omega-3 fatty acids 1000 MG capsule Take 1 capsule by mouth daily.       . lansoprazole (PREVACID) 30 MG capsule Take 30 mg by mouth daily at 12 noon.      . metFORMIN (GLUMETZA) 500 MG (MOD) 24 hr tablet Take 500 mg by mouth every evening.      . promethazine-codeine (PHENERGAN WITH CODEINE) 6.25-10 MG/5ML syrup Take 5 mLs by mouth every 6 (six) hours as needed for cough.       No current facility-administered medications for this visit.    SURGICAL HISTORY:  Past Surgical History  Procedure Laterality Date  . Tubal ligation    . Esophagogastroduodenoscopy  04/15/2007    REVIEW OF SYSTEMS:  Constitutional: positive for fatigue Eyes: negative Ears, nose, mouth, throat, and face: negative Respiratory: negative Cardiovascular: negative Gastrointestinal: negative Genitourinary:negative Integument/breast: negative Hematologic/lymphatic: negative Musculoskeletal:negative Neurological: negative Behavioral/Psych: negative Endocrine: negative Allergic/Immunologic: negative   PHYSICAL EXAMINATION: General appearance: alert, cooperative, fatigued and no distress Head: Normocephalic, without obvious abnormality, atraumatic Neck: no adenopathy, no JVD, supple, symmetrical, trachea midline and thyroid not enlarged, symmetric, no tenderness/mass/nodules Lymph nodes: Cervical, supraclavicular, and axillary nodes normal. Resp: clear to auscultation bilaterally Back: symmetric, no curvature. ROM normal. No CVA tenderness. Cardio: regular rate and rhythm, S1, S2 normal, no murmur, click, rub or gallop GI: soft, non-tender; bowel sounds normal; no masses,  no organomegaly Extremities: extremities normal, atraumatic,  no cyanosis or edema Neurologic: Alert and oriented X 3, normal strength and tone. Normal symmetric reflexes. Normal coordination and gait  ECOG PERFORMANCE STATUS: 1 - Symptomatic but completely  ambulatory  Blood pressure 154/75, pulse 86, temperature 97.8 F (36.6 C), temperature source Oral, resp. rate 18, height _0  (1.676 m), weight 172 lb 1.6 oz (78.064 kg).  LABORATORY DATA: Lab Results  Component Value Date   WBC 4.9 10/19/2013   HGB 8.1* 10/19/2013   HCT 24.4* 10/19/2013   MCV 91.7 10/19/2013   PLT 178 10/19/2013      Chemistry      Component Value Date/Time   NA 141 10/13/2013 1111   NA 140 04/14/2013 0916   K 3.9 10/13/2013 1111   K 3.8 04/14/2013 0916   CL 105 04/14/2013 0916   CO2 25 10/13/2013 1111   CO2 28 04/14/2013 0916   BUN 16.8 10/13/2013 1111   BUN 10 04/14/2013 0916   CREATININE 1.3* 10/13/2013 1111   CREATININE 1.0 04/14/2013 0916      Component Value Date/Time   CALCIUM 9.7 10/13/2013 1111   CALCIUM 9.7 04/14/2013 0916   CALCIUM 9.0 12/18/2009 2256   ALKPHOS 48 10/13/2013 1111   ALKPHOS 42 04/14/2013 0916   AST 18 10/13/2013 1111   AST 23 04/14/2013 0916   ALT 17 10/13/2013 1111   ALT 24 04/14/2013 0916   BILITOT 0.39 10/13/2013 1111   BILITOT 0.4 04/14/2013 0916     Other lab results: Beta-2 microglobulin 3.71, free kappa light chain 0.85, free lambda light chain 48.40 with a kappa/lambda ratio 0.02.  IgG 402, IgA 7030 and IgM 5.  RADIOGRAPHIC STUDIES: Ct Biopsy  10/19/2013   CLINICAL DATA:  Multiple myeloma  EXAM: CT-GUIDED RIGHT ILIAC BONE MARROW ASPIRATE AND CORE  MEDICATIONS AND MEDICAL HISTORY: Versed 3 mg, Fentanyl 100 mcg.  Additional Medications: None.  ANESTHESIA/SEDATION: Moderate sedation time: 11 minutes  PROCEDURE: The procedure, risks, benefits, and alternatives were explained to the patient. Questions regarding the procedure were encouraged and answered. The patient understands and consents to the procedure.  The back was prepped with Betadine in a sterile fashion, and a sterile drape was applied covering the operative field. A surgical mask and sterile gloves were used for the procedure.  Under CT guidance, an 11 gauge needle was inserted into  the right iliac bone. Aspirates were obtained. A core was obtained. Final imaging was performed.  Patient tolerated the procedure well without complication. Vital sign monitoring by nursing staff during the procedure will continue as patient is in the special procedures unit for post procedure observation.  FINDINGS: The images document guide needle placement within the right iliac bone. Post biopsy images demonstrate no hemorrhage.  IMPRESSION: Successful CT-guided right iliac bone marrow aspirate and core.   Electronically Signed   By: Maryclare Bean M.D.   On: 10/19/2013 11:59   Dg Bone Survey Met  10/15/2013   CLINICAL DATA:  History of multiple myeloma. Anterior left rib pain.  EXAM: METASTATIC BONE SURVEY  COMPARISON:  Plain film of the chest 04/05/2012.  FINDINGS: No lytic or sclerotic lesion is seen the axial or appendicular skeleton. No fracture is identified. Thoracolumbar scoliosis is noted. Facet degenerative disease is seen in the lower lumbar spine. Anterior endplate spurring scattered through the thoracic spine is noted. There is some loss of disc space height and endplate spurring in the cervical spine C5-6.  IMPRESSION: Negative for lytic lesion to suggest multiple myeloma. No fracture is identified.  Thoracolumbar scoliosis.  Scattered spondylosis is also seen.   Electronically Signed   By: Inge Rise M.D.   On: 10/15/2013 10:37   Patient: Victoria Gates, Victoria Gates Collected: 10/19/2013 Client: [facility] Accession: [id number] Received: 10/19/2013 Art Hoss DOB: 04-20-1951 Age: 89 Gender: F Reported: 10/21/2013 [address] Patient Ph: [phone] MRN #: [id number] [place], [place] [address] Visit #: [id number] Chart #: Phone: [phone] Fax: CC: Curt Bears, MD BONE MARROW REPORT FINAL DIAGNOSIS Diagnosis Bone Marrow, Aspirate,Biopsy, and Clot, rt iliac bone marrow - HYPERCELLULAR BONE MARROW FOR AGE WITH PLASMA CELL NEOPLASM. - SEE COMMENT. PERIPHERAL BLOOD: -  NORMOCYTIC-NORMOCHROMIC ANEMIA. Diagnosis Note The overall bone marrow material is limited with lack of aspirate material for evaluation and prominent aspiration artifacts in core biopsy. Nonetheless, touch imprints and relatively small intact areas in core biopsies show prominent interstitial plasmacytosis. Immunohistochemical stains show that the plasma cells are lambda light chain restricted consistent with plasma cell neoplasm. Clinical correlation is recommended. (BNS:caf/gt 10/20/13) Susanne Greenhouse MD Pathologist, Electronic Signature (Case signed 10/21/2013)   ASSESSMENT AND PLAN: this is a very pleasant 63 years old [place] American female who was recently diagnosed with IgA multiple myeloma with significant elevation of IgA over 7000. The bone marrow biopsy and aspirate showed significant elevation of plasma cells up to 48%. The patient also has elevated free lambda light chain. A skeletal bone survey showed no significant lytic lesions. I have a lengthy discussion with the patient and her family and I also spoke to her son who is a physician during the visit today. I recommended for the patient induction chemotherapy with either Velcade, Revlimid and dexamethasone or Carfilzomib, Cytoxan and dexamethasone for 4 cycles followed by reevaluation of her disease with repeat bone marrow biopsy and aspirate as well as myeloma panel. If the patient has good response to the induction therapy, she can be considered for autologous peripheral blood stem cell transplant. I will arrange for the patient to see Dr. Melba Coon at [facility] for a second opinion and also to introduce her to the transplant team for recommendation about the best induction therapy before the transplant. I discussed with the patient adverse effect of the chemotherapy including but not limited to myelosuppression, nausea and vomiting, mild alopecia, peripheral neuropathy in addition to hyperglycemia for the steroid  treatment. The patient would like to proceed with the treatment and referral as planned. She would like to hold starting any treatment until she comes back from a planned vacation next week. I would see her back for followup visit in 2 weeks for reevaluation and more detailed discussion of her treatment options based on her visit to [facility]. I would also consider the patient for 24 hour urine protein electrophoreses. The patient may also benefit from treatment with Zometa for multiple myeloma. The patient and her son had several questions today and I answered them completely to their satisfactions. The patient voices understanding of current disease status and treatment options and is in agreement with the current care plan.  All questions were answered. The patient knows to call the clinic with any problems, questions or concerns. We can certainly see the patient much sooner if necessary.  I spent 25 minutes counseling the patient face to face. The total time spent in the appointment was 35 minutes.  Disclaimer: This note was dictated with voice recognition software. Similar sounding words can inadvertently be transcribed and may not be corrected upon review.

## 2013-10-22 NOTE — Telephone Encounter (Signed)
gv pt appt schedule for feb. message to HIM re referral to [facility].

## 2013-10-25 ENCOUNTER — Telehealth: Payer: Self-pay | Admitting: Internal Medicine

## 2013-10-25 NOTE — Telephone Encounter (Signed)
Pt appt. To see Dr. Melba Coon is 11/10/13 @11 :00. Called pt lvm regarding appt. medcial records faxed.  ( 11/10/13 is Dr. Melba Coon next available)

## 2013-10-26 LAB — CHROMOSOME ANALYSIS, BONE MARROW

## 2013-10-27 LAB — BONE MARROW EXAM

## 2013-11-04 ENCOUNTER — Telehealth: Payer: Self-pay | Admitting: Internal Medicine

## 2013-11-04 ENCOUNTER — Encounter: Payer: Self-pay | Admitting: Internal Medicine

## 2013-11-04 ENCOUNTER — Other Ambulatory Visit (HOSPITAL_BASED_OUTPATIENT_CLINIC_OR_DEPARTMENT_OTHER): Payer: 59

## 2013-11-04 ENCOUNTER — Ambulatory Visit (HOSPITAL_BASED_OUTPATIENT_CLINIC_OR_DEPARTMENT_OTHER): Payer: 59 | Admitting: Internal Medicine

## 2013-11-04 VITALS — BP 152/72 | HR 76 | Temp 97.2°F | Resp 18 | Ht 66.0 in | Wt 171.7 lb

## 2013-11-04 DIAGNOSIS — C9000 Multiple myeloma not having achieved remission: Secondary | ICD-10-CM

## 2013-11-04 LAB — COMPREHENSIVE METABOLIC PANEL (CC13)
ALK PHOS: 40 U/L (ref 40–150)
ALT: 15 U/L (ref 0–55)
AST: 16 U/L (ref 5–34)
Albumin: 3.1 g/dL — ABNORMAL LOW (ref 3.5–5.0)
Anion Gap: 13 mEq/L — ABNORMAL HIGH (ref 3–11)
BUN: 15.1 mg/dL (ref 7.0–26.0)
CO2: 25 mEq/L (ref 22–29)
Calcium: 10.1 mg/dL (ref 8.4–10.4)
Chloride: 105 mEq/L (ref 98–109)
Creatinine: 1.3 mg/dL — ABNORMAL HIGH (ref 0.6–1.1)
Glucose: 86 mg/dl (ref 70–140)
Potassium: 3.8 mEq/L (ref 3.5–5.1)
Sodium: 142 mEq/L (ref 136–145)
Total Bilirubin: 0.35 mg/dL (ref 0.20–1.20)
Total Protein: 10.9 g/dL — ABNORMAL HIGH (ref 6.4–8.3)

## 2013-11-04 LAB — CBC WITH DIFFERENTIAL/PLATELET
BASO%: 0.4 % (ref 0.0–2.0)
Basophils Absolute: 0 10*3/uL (ref 0.0–0.1)
EOS%: 0.8 % (ref 0.0–7.0)
Eosinophils Absolute: 0 10*3/uL (ref 0.0–0.5)
HEMATOCRIT: 21.6 % — AB (ref 34.8–46.6)
HGB: 7.5 g/dL — ABNORMAL LOW (ref 11.6–15.9)
LYMPH#: 2.2 10*3/uL (ref 0.9–3.3)
LYMPH%: 50.6 % — ABNORMAL HIGH (ref 14.0–49.7)
MCH: 31.7 pg (ref 25.1–34.0)
MCHC: 34.5 g/dL (ref 31.5–36.0)
MCV: 91.9 fL (ref 79.5–101.0)
MONO#: 0.3 10*3/uL (ref 0.1–0.9)
MONO%: 7.8 % (ref 0.0–14.0)
NEUT#: 1.7 10*3/uL (ref 1.5–6.5)
NEUT%: 40.4 % (ref 38.4–76.8)
Platelets: 186 10*3/uL (ref 145–400)
RBC: 2.35 10*6/uL — ABNORMAL LOW (ref 3.70–5.45)
RDW: 15.2 % — ABNORMAL HIGH (ref 11.2–14.5)
WBC: 4.3 10*3/uL (ref 3.9–10.3)

## 2013-11-04 MED ORDER — ACYCLOVIR 400 MG PO TABS: 400.0000 mg | ORAL_TABLET | Freq: Two times a day (BID) | ORAL | Status: DC

## 2013-11-04 MED ORDER — PROCHLORPERAZINE MALEATE 10 MG PO TABS: 10.0000 mg | ORAL_TABLET | Freq: Four times a day (QID) | ORAL | Status: DC | PRN

## 2013-11-04 MED ORDER — INTEGRA PLUS PO CAPS: 1.0000 | ORAL_CAPSULE | Freq: Every morning | ORAL | Status: DC

## 2013-11-04 MED ORDER — DEXAMETHASONE 4 MG PO TABS: ORAL_TABLET | ORAL | Status: DC

## 2013-11-04 NOTE — Progress Notes (Signed)
[facility] [phone]   Fax:(336) [phone]  OFFICE PROGRESS NOTE  [name], MD 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 First dose expected on 11/15/2048  INTERVAL HISTORY: KATRIA BOTTS 63 y.o. female returns to the clinic today for follow up visit accompanied by her husband. The patient is feeling fine today with no specific complaints except for mild fatigue. She denied having any significant chest pain, shortness of breath, cough or hemoptysis. The patient denied having any significant weight loss or night sweats. She has no nausea or vomiting. She is supposed to see Dr. Melba Coon at [facility] for second opinion next week. She is here for evaluation and discussion of her treatment options. The patient was to start his treatment as soon as possible.   MEDICAL HISTORY: Past Medical History  Diagnosis Date  . COLONIC POLYPS, HX OF 04/24/2007  . OSTEOPOROSIS 04/24/2007  . GERD 09/21/2010  . DM 03/18/2008  . HYPERCHOLESTEROLEMIA 07/03/2009  . ANEMIA-IRON DEFICIENCY 04/24/2007  . ALLERGIC RHINITIS CAUSE UNSPECIFIED 01/02/2009  . ESOPHAGEAL STRICTURE 04/15/2007    s/p dilitation  . HIATAL HERNIA 04/15/2007  . CERVICAL RADICULOPATHY, LEFT 02/01/2010  . 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 Chest pain 03/2012    a. 04/15/2012 Ex MV: Ex time 10 mins, EF 63%, no ischemia/infarct.  Occas PAC's/PVC's.    ALLERGIES:  is allergic to atorvastatin; rosuvastatin; sulfamethoxazole; and sulfonamide derivatives.  MEDICATIONS:  Current Outpatient Prescriptions  Medication Sig Dispense Refill  . aspirin 81 MG tablet Take 81 mg by mouth daily.       . Calcium 500-125 MG-UNIT TABS Take 1 tablet by mouth daily.        . cholecalciferol (VITAMIN D) 1000 UNITS tablet Take 1,000 Units by mouth  daily.      . colestipol (COLESTID) 1 G tablet Take 1 g by mouth 2 (two) times daily.      Marland Kitchen ezetimibe (ZETIA) 10 MG tablet Take 10 mg by mouth every evening.      . fish oil-omega-3 fatty acids 1000 MG capsule Take 1 capsule by mouth daily.       . lansoprazole (PREVACID) 30 MG capsule Take 30 mg by mouth daily at 12 noon.      . metFORMIN (GLUMETZA) 500 MG (MOD) 24 hr tablet Take 500 mg by mouth every evening.      . promethazine-codeine (PHENERGAN WITH CODEINE) 6.25-10 MG/5ML syrup Take 5 mLs by mouth every 6 (six) hours as needed for cough.       No current facility-administered medications for this visit.    SURGICAL HISTORY:  Past Surgical History  Procedure Laterality Date  . Tubal ligation    . Esophagogastroduodenoscopy  04/15/2007    REVIEW OF SYSTEMS:  Constitutional: positive for fatigue Eyes: negative Ears, nose, mouth, throat, and face: negative Respiratory: negative Cardiovascular: negative Gastrointestinal: negative Genitourinary:negative Integument/breast: negative Hematologic/lymphatic: negative Musculoskeletal:negative Neurological: negative Behavioral/Psych: negative Endocrine: negative Allergic/Immunologic: negative   PHYSICAL EXAMINATION: General appearance: alert, cooperative, fatigued and no distress Head: Normocephalic, without obvious abnormality, atraumatic Neck: no adenopathy, no JVD, supple, symmetrical, trachea midline and thyroid not enlarged, symmetric, no tenderness/mass/nodules Lymph nodes: Cervical, supraclavicular, and axillary nodes normal. Resp: clear to auscultation bilaterally Back: symmetric, no curvature. ROM normal. No CVA tenderness. Cardio: regular rate and rhythm, S1, S2 normal,  no murmur, click, rub or gallop GI: soft, non-tender; bowel sounds normal; no masses,  no organomegaly Extremities: extremities normal, atraumatic, no cyanosis or edema Neurologic: Alert and oriented X 3, normal strength and tone. Normal symmetric  reflexes. Normal coordination and gait  ECOG PERFORMANCE STATUS: 1 - Symptomatic but completely ambulatory  Blood pressure 152/72, pulse 76, temperature 97.2 F (36.2 C), temperature source Oral, resp. rate 18, height _0  (1.676 m), weight 171 lb 11.2 oz (77.883 kg), SpO2 100.00%.  LABORATORY DATA: Lab Results  Component Value Date   WBC 4.3 11/04/2013   HGB 7.5* 11/04/2013   HCT 21.6* 11/04/2013   MCV 91.9 11/04/2013   PLT 186 11/04/2013      Chemistry      Component Value Date/Time   NA 142 11/04/2013 0907   NA 140 04/14/2013 0916   K 3.8 11/04/2013 0907   K 3.8 04/14/2013 0916   CL 105 04/14/2013 0916   CO2 25 11/04/2013 0907   CO2 28 04/14/2013 0916   BUN 15.1 11/04/2013 0907   BUN 10 04/14/2013 0916   CREATININE 1.3* 11/04/2013 0907   CREATININE 1.0 04/14/2013 0916      Component Value Date/Time   CALCIUM 10.1 11/04/2013 0907   CALCIUM 9.7 04/14/2013 0916   CALCIUM 9.0 12/18/2009 2256   ALKPHOS 40 11/04/2013 0907   ALKPHOS 42 04/14/2013 0916   AST 16 11/04/2013 0907   AST 23 04/14/2013 0916   ALT 15 11/04/2013 0907   ALT 24 04/14/2013 0916   BILITOT 0.35 11/04/2013 0907   BILITOT 0.4 04/14/2013 0916     Other lab results: Beta-2 microglobulin 3.71, free kappa light chain 0.85, free lambda light chain 48.40 with a kappa/lambda ratio 0.02.  IgG 402, IgA 7030 and IgM 5.  RADIOGRAPHIC STUDIES: Ct Biopsy  10/19/2013   CLINICAL DATA:  Multiple myeloma  EXAM: CT-GUIDED RIGHT ILIAC BONE MARROW ASPIRATE AND CORE  MEDICATIONS AND MEDICAL HISTORY: Versed 3 mg, Fentanyl 100 mcg.  Additional Medications: None.  ANESTHESIA/SEDATION: Moderate sedation time: 11 minutes  PROCEDURE: The procedure, risks, benefits, and alternatives were explained to the patient. Questions regarding the procedure were encouraged and answered. The patient understands and consents to the procedure.  The back was prepped with Betadine in a sterile fashion, and a sterile drape was applied covering the operative field. A surgical mask and  sterile gloves were used for the procedure.  Under CT guidance, an 11 gauge needle was inserted into the right iliac bone. Aspirates were obtained. A core was obtained. Final imaging was performed.  Patient tolerated the procedure well without complication. Vital sign monitoring by nursing staff during the procedure will continue as patient is in the special procedures unit for post procedure observation.  FINDINGS: The images document guide needle placement within the right iliac bone. Post biopsy images demonstrate no hemorrhage.  IMPRESSION: Successful CT-guided right iliac bone marrow aspirate and core.   Electronically Signed   By: Maryclare Bean M.D.   On: 10/19/2013 11:59   Dg Bone Survey Met  10/15/2013   CLINICAL DATA:  History of multiple myeloma. Anterior left rib pain.  EXAM: METASTATIC BONE SURVEY  COMPARISON:  Plain film of the chest 04/05/2012.  FINDINGS: No lytic or sclerotic lesion is seen the axial or appendicular skeleton. No fracture is identified. Thoracolumbar scoliosis is noted. Facet degenerative disease is seen in the lower lumbar spine. Anterior endplate spurring scattered through the thoracic spine is noted. There is some loss of disc space  height and endplate spurring in the cervical spine C5-6.  IMPRESSION: Negative for lytic lesion to suggest multiple myeloma. No fracture is identified.  Thoracolumbar scoliosis.  Scattered spondylosis is also seen.   Electronically Signed   By: Inge Rise M.D.   On: 10/15/2013 10:37   Patient: Victoria Gates, Victoria Gates Collected: 10/19/2013 Client: [facility] Accession: [id number] Received: 10/19/2013 Art Hoss DOB: May 05, 1951 Age: 33 Gender: F Reported: 10/21/2013 [address] Patient Ph: [phone] MRN #: [id number] [place], [place] [address] Visit #: [id number] Chart #: Phone: [phone] Fax: CC: Curt Bears, MD BONE MARROW REPORT FINAL DIAGNOSIS Diagnosis Bone Marrow, Aspirate,Biopsy, and Clot, rt iliac bone marrow -  HYPERCELLULAR BONE MARROW FOR AGE WITH PLASMA CELL NEOPLASM. - SEE COMMENT. PERIPHERAL BLOOD: - NORMOCYTIC-NORMOCHROMIC ANEMIA. Diagnosis Note The overall bone marrow material is limited with lack of aspirate material for evaluation and prominent aspiration artifacts in core biopsy. Nonetheless, touch imprints and relatively small intact areas in core biopsies show prominent interstitial plasmacytosis. Immunohistochemical stains show that the plasma cells are lambda light chain restricted consistent with plasma cell neoplasm. Clinical correlation is recommended. (BNS:caf/gt 10/20/13) Susanne Greenhouse MD Pathologist, Electronic Signature (Case signed 10/21/2013)   ASSESSMENT AND PLAN: this is a very pleasant 63 years old [place] American female who was recently diagnosed with IgA multiple myeloma with significant elevation of IgA over 7000. The bone marrow biopsy and aspirate showed significant elevation of plasma cells up to 48%. The patient also has elevated free lambda light chain. A skeletal bone survey showed no significant lytic lesions. I have a lengthy discussion with the patient and her husband today about her treatment options. She is scheduled to see Dr. Melba Coon next week for second opinion. I recommended for the patient to consider treatment with Carfilzomib, Cytoxan and dexamethasone starting 11/15/2013 unless Dr. Melba Coon has a better option for this patient including clinical. I discussed with the patient and her husband the adverse effect of this treatment including but not limited to mild alopecia, myelosuppression, nausea and vomiting, peripheral neuropathy, liver or renal dysfunction as well as cardiac dysfunction.  She had 2-D echo performed in July of 2013 that showed ejection fraction of 50-55%. She may need repeat 2-D echo before starting her chemotherapy with Carfilzomib. I will arrange for the patient to have a chemotherapy education class before starting the first cycle of  this treatment. I will call her pharmacy with prescription for dexamethasone 40 mg weekly with the chemotherapy in addition to Compazine 10 mg by mouth every 6 hours as needed for nausea and acyclovir 400 mg by mouth twice a day for zoster prophylaxis. She would come back for follow up visit in 2 weeks for evaluation and management any adverse effect of her treatment. The patient voices understanding of current disease status and treatment options and is in agreement with the current care plan.  All questions were answered. The patient knows to call the clinic with any problems, questions or concerns. We can certainly see the patient much sooner if necessary.  I spent 15 minutes counseling the patient face to face. The total time spent in the appointment was 25 minutes.  Disclaimer: This note was dictated with voice recognition software. Similar sounding words can inadvertently be transcribed and may not be corrected upon review.

## 2013-11-04 NOTE — Telephone Encounter (Signed)
gv and printed appt sched and avs forpt for Feb adn March....sed added tx.    °

## 2013-11-06 NOTE — Patient Instructions (Signed)
We discussed your treatment options including systemic chemotherapy with Carfilzomib,  Cytoxan and Decadron. First cycle of this treatment on 11/15/2013.

## 2013-11-08 ENCOUNTER — Telehealth: Payer: Self-pay | Admitting: Internal Medicine

## 2013-11-08 NOTE — Telephone Encounter (Signed)
s.w. pt and advised on 2.10.15 appt.Marland KitchenMarland KitchenMarland KitchenMarland Kitchenpt ok and aware

## 2013-11-09 ENCOUNTER — Other Ambulatory Visit (HOSPITAL_BASED_OUTPATIENT_CLINIC_OR_DEPARTMENT_OTHER): Payer: 59

## 2013-11-09 ENCOUNTER — Other Ambulatory Visit: Payer: 59

## 2013-11-09 ENCOUNTER — Telehealth: Payer: Self-pay | Admitting: *Deleted

## 2013-11-09 ENCOUNTER — Ambulatory Visit (HOSPITAL_COMMUNITY)
Admission: RE | Admit: 2013-11-09 | Discharge: 2013-11-09 | Disposition: A | Discharge status: Home / Self Care | Payer: 59 | Source: Ambulatory Visit | Attending: Internal Medicine | Admitting: Internal Medicine

## 2013-11-09 ENCOUNTER — Encounter: Payer: Self-pay | Admitting: *Deleted

## 2013-11-09 DIAGNOSIS — C9000 Multiple myeloma not having achieved remission: Secondary | ICD-10-CM

## 2013-11-09 DIAGNOSIS — I079 Rheumatic tricuspid valve disease, unspecified: Secondary | ICD-10-CM | POA: Insufficient documentation

## 2013-11-09 DIAGNOSIS — I369 Nonrheumatic tricuspid valve disorder, unspecified: Secondary | ICD-10-CM

## 2013-11-09 DIAGNOSIS — E785 Hyperlipidemia, unspecified: Secondary | ICD-10-CM | POA: Insufficient documentation

## 2013-11-09 LAB — COMPREHENSIVE METABOLIC PANEL (CC13)
ALBUMIN: 3.1 g/dL — AB (ref 3.5–5.0)
ALT: 12 U/L (ref 0–55)
AST: 16 U/L (ref 5–34)
Alkaline Phosphatase: 40 U/L (ref 40–150)
Anion Gap: 14 mEq/L — ABNORMAL HIGH (ref 3–11)
BUN: 16.3 mg/dL (ref 7.0–26.0)
CO2: 23 mEq/L (ref 22–29)
Calcium: 10.1 mg/dL (ref 8.4–10.4)
Chloride: 103 mEq/L (ref 98–109)
Creatinine: 1.3 mg/dL — ABNORMAL HIGH (ref 0.6–1.1)
Glucose: 123 mg/dl (ref 70–140)
POTASSIUM: 3.7 meq/L (ref 3.5–5.1)
Sodium: 140 mEq/L (ref 136–145)
Total Bilirubin: 0.3 mg/dL (ref 0.20–1.20)
Total Protein: 10.9 g/dL — ABNORMAL HIGH (ref 6.4–8.3)

## 2013-11-09 LAB — CBC WITH DIFFERENTIAL/PLATELET
BASO%: 0.2 % (ref 0.0–2.0)
Basophils Absolute: 0 10*3/uL (ref 0.0–0.1)
EOS%: 0.8 % (ref 0.0–7.0)
Eosinophils Absolute: 0 10*3/uL (ref 0.0–0.5)
HCT: 23.3 % — ABNORMAL LOW (ref 34.8–46.6)
HGB: 7.6 g/dL — ABNORMAL LOW (ref 11.6–15.9)
LYMPH#: 2.7 10*3/uL (ref 0.9–3.3)
LYMPH%: 51.3 % — ABNORMAL HIGH (ref 14.0–49.7)
MCH: 30 pg (ref 25.1–34.0)
MCHC: 32.6 g/dL (ref 31.5–36.0)
MCV: 92.1 fL (ref 79.5–101.0)
MONO#: 0.3 10*3/uL (ref 0.1–0.9)
MONO%: 5.5 % (ref 0.0–14.0)
NEUT#: 2.2 10*3/uL (ref 1.5–6.5)
NEUT%: 42.2 % (ref 38.4–76.8)
Platelets: 177 10*3/uL (ref 145–400)
RBC: 2.53 10*6/uL — ABNORMAL LOW (ref 3.70–5.45)
RDW: 15.7 % — AB (ref 11.2–14.5)
WBC: 5.3 10*3/uL (ref 3.9–10.3)

## 2013-11-09 NOTE — Telephone Encounter (Signed)
Pt wanted to know when she can start acyclovir.  Per dr Vista Mink, okay to start when 1st cycle of chemo begins.  Pt verbalized understanding.  SLJ

## 2013-11-09 NOTE — Progress Notes (Signed)
  Echocardiogram 2D Echocardiogram limited has been performed.  Diamond Nickel 11/09/2013, 11:46 AM

## 2013-11-10 LAB — UPEP/TP, 24-HR URINE
Albumin: 5.9 %
Alpha-1-Globulin, U: 12.2 %
Alpha-2-Globulin, U: 7.5 %
BETA GLOBULIN, U: 12.7 %
Collection Interval: 24 hours
GAMMA GLOBULIN, U: 61.7 %
Monoclonal Band 1: 41 %
TOTAL PROTEIN, URINE/DAY: 357 mg/d — AB (ref 50–100)
TOTAL VOLUME, URINE: 1700 mL
Total Protein, Urine: 21 mg/dL

## 2013-11-11 ENCOUNTER — Telehealth: Payer: Self-pay | Admitting: *Deleted

## 2013-11-11 NOTE — Telephone Encounter (Signed)
Pt called wanting to know whether pt could continue to take baby ASA.  Per AJ, okay for pt to continue ASA.  Pt verbalized understanding.  SLJ

## 2013-11-15 ENCOUNTER — Other Ambulatory Visit (HOSPITAL_BASED_OUTPATIENT_CLINIC_OR_DEPARTMENT_OTHER): Payer: 59

## 2013-11-15 ENCOUNTER — Other Ambulatory Visit: Payer: Self-pay | Admitting: Internal Medicine

## 2013-11-15 ENCOUNTER — Encounter (INDEPENDENT_AMBULATORY_CARE_PROVIDER_SITE_OTHER): Payer: Self-pay

## 2013-11-15 ENCOUNTER — Ambulatory Visit (HOSPITAL_BASED_OUTPATIENT_CLINIC_OR_DEPARTMENT_OTHER): Payer: 59

## 2013-11-15 VITALS — BP 129/77 | HR 75 | Temp 98.4°F | Resp 18

## 2013-11-15 DIAGNOSIS — C9000 Multiple myeloma not having achieved remission: Secondary | ICD-10-CM

## 2013-11-15 DIAGNOSIS — Z5112 Encounter for antineoplastic immunotherapy: Secondary | ICD-10-CM

## 2013-11-15 LAB — COMPREHENSIVE METABOLIC PANEL (CC13)
ALK PHOS: 43 U/L (ref 40–150)
ALT: 13 U/L (ref 0–55)
ANION GAP: 13 meq/L — AB (ref 3–11)
AST: 20 U/L (ref 5–34)
Albumin: 3.1 g/dL — ABNORMAL LOW (ref 3.5–5.0)
BUN: 15.9 mg/dL (ref 7.0–26.0)
CO2: 22 mEq/L (ref 22–29)
CREATININE: 1.2 mg/dL — AB (ref 0.6–1.1)
Calcium: 10 mg/dL (ref 8.4–10.4)
Chloride: 105 mEq/L (ref 98–109)
Glucose: 98 mg/dl (ref 70–140)
Potassium: 3.8 mEq/L (ref 3.5–5.1)
SODIUM: 140 meq/L (ref 136–145)
TOTAL PROTEIN: 11 g/dL — AB (ref 6.4–8.3)
Total Bilirubin: 0.24 mg/dL (ref 0.20–1.20)

## 2013-11-15 LAB — CBC WITH DIFFERENTIAL/PLATELET
BASO%: 0.4 % (ref 0.0–2.0)
Basophils Absolute: 0 10*3/uL (ref 0.0–0.1)
EOS%: 0.9 % (ref 0.0–7.0)
Eosinophils Absolute: 0.1 10*3/uL (ref 0.0–0.5)
HCT: 22.9 % — ABNORMAL LOW (ref 34.8–46.6)
HGB: 7.5 g/dL — ABNORMAL LOW (ref 11.6–15.9)
LYMPH%: 49.1 % (ref 14.0–49.7)
MCH: 30 pg (ref 25.1–34.0)
MCHC: 32.8 g/dL (ref 31.5–36.0)
MCV: 91.6 fL (ref 79.5–101.0)
MONO#: 0.4 10*3/uL (ref 0.1–0.9)
MONO%: 7.2 % (ref 0.0–14.0)
NEUT#: 2.4 10*3/uL (ref 1.5–6.5)
NEUT%: 42.4 % (ref 38.4–76.8)
NRBC: 0 % (ref 0–0)
PLATELETS: 173 10*3/uL (ref 145–400)
RBC: 2.5 10*6/uL — ABNORMAL LOW (ref 3.70–5.45)
RDW: 15.6 % — AB (ref 11.2–14.5)
WBC: 5.7 10*3/uL (ref 3.9–10.3)
lymph#: 2.8 10*3/uL (ref 0.9–3.3)

## 2013-11-15 MED ORDER — DEXAMETHASONE SODIUM PHOSPHATE 20 MG/5ML IJ SOLN
INTRAMUSCULAR | Status: AC
  Filled 2013-11-15: qty 10

## 2013-11-15 MED ORDER — CYCLOPHOSPHAMIDE CHEMO INJECTION 1 GM
300.0000 mg/m2 | Freq: Once | INTRAMUSCULAR | Status: AC
  Administered 2013-11-15: 580 mg via INTRAVENOUS
  Filled 2013-11-15: qty 29

## 2013-11-15 MED ORDER — DEXTROSE 5 % IV SOLN
20.0000 mg/m2 | Freq: Once | INTRAVENOUS | Status: AC
  Administered 2013-11-15: 38 mg via INTRAVENOUS
  Filled 2013-11-15: qty 19

## 2013-11-15 MED ORDER — SODIUM CHLORIDE 0.9 % IV SOLN
Freq: Once | INTRAVENOUS | Status: AC
  Administered 2013-11-15: 09:00:00 via INTRAVENOUS

## 2013-11-15 MED ORDER — ONDANSETRON 8 MG/NS 50 ML IVPB
INTRAVENOUS | Status: AC
  Filled 2013-11-15: qty 8

## 2013-11-15 MED ORDER — DEXAMETHASONE SODIUM PHOSPHATE 20 MG/5ML IJ SOLN
40.0000 mg | Freq: Once | INTRAMUSCULAR | Status: AC
  Administered 2013-11-15: 40 mg via INTRAVENOUS

## 2013-11-15 MED ORDER — ONDANSETRON 8 MG/50ML IVPB (CHCC)
8.0000 mg | Freq: Once | INTRAVENOUS | Status: AC
  Administered 2013-11-15: 8 mg via INTRAVENOUS

## 2013-11-15 NOTE — Progress Notes (Signed)
Okay to tx with HGB-7.5. Patient asymptomatic.

## 2013-11-15 NOTE — Patient Instructions (Signed)
[facility] Discharge Instructions for Patients Receiving Chemotherapy  Today you received the following chemotherapy agents: Cytoxan, Kyprolis  To help prevent nausea and vomiting after your treatment, we encourage you to take your nausea medication: Compazine 10 mg every 6 hrs as needed. TAKE ONE DOSE TONIGHT AT BEDTIME REGARDLESS IF NAUSEA IS PRESENT OR NOT.   If you develop nausea and vomiting that is not controlled by your nausea medication, call the clinic.   BELOW ARE SYMPTOMS THAT SHOULD BE REPORTED IMMEDIATELY:  *FEVER GREATER THAN 100.5 F  *CHILLS WITH OR WITHOUT FEVER  NAUSEA AND VOMITING THAT IS NOT CONTROLLED WITH YOUR NAUSEA MEDICATION  *UNUSUAL SHORTNESS OF BREATH  *UNUSUAL BRUISING OR BLEEDING  TENDERNESS IN MOUTH AND THROAT WITH OR WITHOUT PRESENCE OF ULCERS  *URINARY PROBLEMS  *BOWEL PROBLEMS  UNUSUAL RASH Items with * indicate a potential emergency and should be followed up as soon as possible.  Feel free to call the clinic you have any questions or concerns. The clinic phone number is [phone].  Cyclophosphamide injection What is this medicine? CYCLOPHOSPHAMIDE (sye kloe FOSS fa mide) is a chemotherapy drug. It slows the growth of cancer cells. This medicine is used to treat many types of cancer like lymphoma, myeloma, leukemia, breast cancer, and ovarian cancer, to name a few. This medicine may be used for other purposes; ask your health care provider or pharmacist if you have questions. COMMON BRAND NAME(S): Cytoxan, Neosar What should I tell my health care provider before I take this medicine? They need to know if you have any of these conditions: -blood disorders -history of other chemotherapy -infection -kidney disease -liver disease -recent or ongoing radiation therapy -tumors in the bone marrow -an unusual or allergic reaction to cyclophosphamide, other chemotherapy, other medicines, foods, dyes, or preservatives -pregnant  or trying to get pregnant -breast-feeding How should I use this medicine? This drug is usually given as an injection into a vein or muscle or by infusion into a vein. It is administered in a hospital or clinic by a specially trained health care professional. Talk to your pediatrician regarding the use of this medicine in children. Special care may be needed. Overdosage: If you think you have taken too much of this medicine contact a poison control center or emergency room at once. NOTE: This medicine is only for you. Do not share this medicine with others. What if I miss a dose? It is important not to miss your dose. Call your doctor or health care professional if you are unable to keep an appointment. What may interact with this medicine? This medicine may interact with the following medications: -amiodarone -amphotericin B -azathioprine -certain antiviral medicines for HIV or AIDS such as protease inhibitors (e.g., indinavir, ritonavir) and zidovudine -certain blood pressure medications such as benazepril, captopril, enalapril, fosinopril, lisinopril, moexipril, monopril, perindopril, quinapril, ramipril, trandolapril -certain cancer medications such as anthracyclines (e.g., daunorubicin, doxorubicin), busulfan, cytarabine, paclitaxel, pentostatin, tamoxifen, trastuzumab -certain diuretics such as chlorothiazide, chlorthalidone, hydrochlorothiazide, indapamide, metolazone -certain medicines that treat or prevent blood clots like warfarin -certain muscle relaxants such as succinylcholine -cyclosporine -etanercept -indomethacin -medicines to increase blood counts like filgrastim, pegfilgrastim, sargramostim -medicines used as general anesthesia -metronidazole -natalizumab This list may not describe all possible interactions. Give your health care provider a list of all the medicines, herbs, non-prescription drugs, or dietary supplements you use. Also tell them if you smoke, drink alcohol,  or use illegal drugs. Some items may interact with your medicine. What should I  watch for while using this medicine? Visit your doctor for checks on your progress. This drug may make you feel generally unwell. This is not uncommon, as chemotherapy can affect healthy cells as well as cancer cells. Report any side effects. Continue your course of treatment even though you feel ill unless your doctor tells you to stop. Drink water or other fluids as directed. Urinate often, even at night. In some cases, you may be given additional medicines to help with side effects. Follow all directions for their use. Call your doctor or health care professional for advice if you get a fever, chills or sore throat, or other symptoms of a cold or flu. Do not treat yourself. This drug decreases your body's ability to fight infections. Try to avoid being around people who are sick. This medicine may increase your risk to bruise or bleed. Call your doctor or health care professional if you notice any unusual bleeding. Be careful brushing and flossing your teeth or using a toothpick because you may get an infection or bleed more easily. If you have any dental work done, tell your dentist you are receiving this medicine. You may get drowsy or dizzy. Do not drive, use machinery, or do anything that needs mental alertness until you know how this medicine affects you. Do not become pregnant while taking this medicine or for 1 year after stopping it. Women should inform their doctor if they wish to become pregnant or think they might be pregnant. Men should not father a child while taking this medicine and for 4 months after stopping it. There is a potential for serious side effects to an unborn child. Talk to your health care professional or pharmacist for more information. Do not breast-feed an infant while taking this medicine. This medicine may interfere with the ability to have a child. This medicine has caused ovarian failure  in some women. This medicine has caused reduced sperm counts in some men. You should talk with your doctor or health care professional if you are concerned about your fertility. If you are going to have surgery, tell your doctor or health care professional that you have taken this medicine. What side effects may I notice from receiving this medicine? Side effects that you should report to your doctor or health care professional as soon as possible: -allergic reactions like skin rash, itching or hives, swelling of the face, lips, or tongue -low blood counts - this medicine may decrease the number of white blood cells, red blood cells and platelets. You may be at increased risk for infections and bleeding. -signs of infection - fever or chills, cough, sore throat, pain or difficulty passing urine -signs of decreased platelets or bleeding - bruising, pinpoint red spots on the skin, black, tarry stools, blood in the urine -signs of decreased red blood cells - unusually weak or tired, fainting spells, lightheadedness -breathing problems -dark urine -dizziness -palpitations -swelling of the ankles, feet, hands -trouble passing urine or change in the amount of urine -weight gain -yellowing of the eyes or skin Side effects that usually do not require medical attention (report to your doctor or health care professional if they continue or are bothersome): -changes in nail or skin color -hair loss -missed menstrual periods -mouth sores -nausea, vomiting This list may not describe all possible side effects. Call your doctor for medical advice about side effects. You may report side effects to FDA at 1-800-FDA-1088. Where should I keep my medicine? This drug is given in a  hospital or clinic and will not be stored at home. NOTE: This sheet is a summary. It may not cover all possible information. If you have questions about this medicine, talk to your doctor, pharmacist, or health care provider.  2014,  Elsevier/Gold Standard. (2012-07-31 16:22:58)  Carfilzomib injection What is this medicine? CARFILZOMIB (kar FILZ oh mib) is a chemotherapy drug that works by slowing or stopping cancer cell growth. This medicine is used to treat multiple myeloma. This medicine may be used for other purposes; ask your health care provider or pharmacist if you have questions. COMMON BRAND NAME(S): KYPROLIS What should I tell my health care provider before I take this medicine? They need to know if you have any of these conditions: -heart disease -irregular heartbeat -liver disease -lung or breathing disease -an unusual or allergic reaction to carfilzomib, or other medicines, foods, dyes, or preservatives -pregnant or trying to get pregnant -breast-feeding How should I use this medicine? This medicine is for injection or infusion into a vein. It is given by a health care professional in a hospital or clinic setting. Talk to your pediatrician regarding the use of this medicine in children. Special care may be needed. Overdosage: If you think you've taken too much of this medicine contact a poison control center or emergency room at once. Overdosage: If you think you have taken too much of this medicine contact a poison control center or emergency room at once. NOTE: This medicine is only for you. Do not share this medicine with others. What if I miss a dose? It is important not to miss your dose. Call your doctor or health care professional if you are unable to keep an appointment. What may interact with this medicine? Interactions are not expected. Give your health care provider a list of all the medicines, herbs, non-prescription drugs, or dietary supplements you use. Also tell them if you smoke, drink alcohol, or use illegal drugs. Some items may interact with your medicine. This list may not describe all possible interactions. Give your health care provider a list of all the medicines, herbs,  non-prescription drugs, or dietary supplements you use. Also tell them if you smoke, drink alcohol, or use illegal drugs. Some items may interact with your medicine. What should I watch for while using this medicine? Your condition will be monitored carefully while you are receiving this medicine. Report any side effects. Continue your course of treatment even though you feel ill unless your doctor tells you to stop. Call your doctor or health care professional for advice if you get a fever, chills or sore throat, or other symptoms of a cold or flu. Do not treat yourself. Try to avoid being around people who are sick. Do not become pregnant while taking this medicine. Women should inform their doctor if they wish to become pregnant or think they might be pregnant. There is a potential for serious side effects to an unborn child. Talk to your health care professional or pharmacist for more information. Do not breast-feed an infant while taking this medicine. Check with your doctor or health care professional if you get an attack of severe diarrhea, nausea and vomiting, or if you sweat a lot. The loss of too much body fluid can make it dangerous for you to take this medicine. You may get dizzy. Do not drive, use machinery, or do anything that needs mental alertness until you know how this medicine affects you. Do not stand or sit up quickly, especially if you are  an older patient. This reduces the risk of dizzy or fainting spells. What side effects may I notice from receiving this medicine? Side effects that you should report to your doctor or health care professional as soon as possible: -allergic reactions like skin rash, itching or hives, swelling of the face, lips, or tongue -breathing problems -chest pain or palpitationschest tightness -cough -dark urine -dizziness -feeling faint or lightheaded -fever or chills -general ill feeling or flu-like symptoms -light-colored  stools -palpitations -right upper belly pain -swelling of the legs or ankles -unusual bleeding or bruising -unusually weak or tired -yellowing of the eyes or skin  Side effects that usually do not require medical attention (Report these to your doctor or health care professional if they continue or are bothersome.): -diarrhea -headache -nausea, vomiting -tiredness This list may not describe all possible side effects. Call your doctor for medical advice about side effects. You may report side effects to FDA at 1-800-FDA-1088. Where should I keep my medicine? This drug is given in a hospital or clinic and will not be stored at home. NOTE: This sheet is a summary. It may not cover all possible information. If you have questions about this medicine, talk to your doctor, pharmacist, or health care provider.  2014, Elsevier/Gold Standard. (2012-03-06 17:02:29)

## 2013-11-16 ENCOUNTER — Encounter (INDEPENDENT_AMBULATORY_CARE_PROVIDER_SITE_OTHER): Payer: Self-pay

## 2013-11-16 ENCOUNTER — Ambulatory Visit (HOSPITAL_BASED_OUTPATIENT_CLINIC_OR_DEPARTMENT_OTHER): Payer: 59

## 2013-11-16 VITALS — BP 154/77 | HR 89 | Temp 97.8°F | Resp 18

## 2013-11-16 DIAGNOSIS — Z5112 Encounter for antineoplastic immunotherapy: Secondary | ICD-10-CM

## 2013-11-16 DIAGNOSIS — C9000 Multiple myeloma not having achieved remission: Secondary | ICD-10-CM

## 2013-11-16 MED ORDER — ONDANSETRON 8 MG/50ML IVPB (CHCC)
8.0000 mg | Freq: Once | INTRAVENOUS | Status: AC
  Administered 2013-11-16: 8 mg via INTRAVENOUS

## 2013-11-16 MED ORDER — SODIUM CHLORIDE 0.9 % IV SOLN
Freq: Once | INTRAVENOUS | Status: AC
  Administered 2013-11-16: 09:00:00 via INTRAVENOUS

## 2013-11-16 MED ORDER — DEXTROSE 5 % IV SOLN
20.0000 mg/m2 | Freq: Once | INTRAVENOUS | Status: AC
  Administered 2013-11-16: 38 mg via INTRAVENOUS
  Filled 2013-11-16: qty 19

## 2013-11-16 MED ORDER — DEXAMETHASONE SODIUM PHOSPHATE 10 MG/ML IJ SOLN
10.0000 mg | Freq: Once | INTRAMUSCULAR | Status: AC
  Administered 2013-11-16: 10 mg via INTRAVENOUS

## 2013-11-16 MED ORDER — DEXAMETHASONE SODIUM PHOSPHATE 10 MG/ML IJ SOLN
INTRAMUSCULAR | Status: AC
  Filled 2013-11-16: qty 1

## 2013-11-16 MED ORDER — ONDANSETRON 8 MG/NS 50 ML IVPB
INTRAVENOUS | Status: AC
  Filled 2013-11-16: qty 8

## 2013-11-16 MED ORDER — SODIUM CHLORIDE 0.9 % IV SOLN
Freq: Once | INTRAVENOUS | Status: AC
  Administered 2013-11-16: 08:00:00 via INTRAVENOUS

## 2013-11-16 NOTE — Patient Instructions (Signed)
[facility] Discharge Instructions for Patients Receiving Chemotherapy  Today you received the following chemotherapy agents :  Kyprolis.  To help prevent nausea and vomiting after your treatment, we encourage you to take your nausea medication as instructed by your physician.   If you develop nausea and vomiting that is not controlled by your nausea medication, call the clinic.   BELOW ARE SYMPTOMS THAT SHOULD BE REPORTED IMMEDIATELY:  *FEVER GREATER THAN 100.5 F  *CHILLS WITH OR WITHOUT FEVER  NAUSEA AND VOMITING THAT IS NOT CONTROLLED WITH YOUR NAUSEA MEDICATION  *UNUSUAL SHORTNESS OF BREATH  *UNUSUAL BRUISING OR BLEEDING  TENDERNESS IN MOUTH AND THROAT WITH OR WITHOUT PRESENCE OF ULCERS  *URINARY PROBLEMS  *BOWEL PROBLEMS  UNUSUAL RASH Items with * indicate a potential emergency and should be followed up as soon as possible.  Feel free to call the clinic you have any questions or concerns. The clinic phone number is [phone].    

## 2013-11-17 ENCOUNTER — Telehealth: Payer: Self-pay | Admitting: *Deleted

## 2013-11-17 NOTE — Telephone Encounter (Signed)
No complaints. Asking about fresh fruits and vegetables--instructed her OK to eat these at home as long as she washes them well. Do not eat salad/food from public buffet at this time.

## 2013-11-22 ENCOUNTER — Ambulatory Visit (HOSPITAL_BASED_OUTPATIENT_CLINIC_OR_DEPARTMENT_OTHER): Payer: 59 | Admitting: Internal Medicine

## 2013-11-22 ENCOUNTER — Telehealth: Payer: Self-pay | Admitting: Internal Medicine

## 2013-11-22 ENCOUNTER — Encounter: Payer: Self-pay | Admitting: Internal Medicine

## 2013-11-22 ENCOUNTER — Other Ambulatory Visit (HOSPITAL_BASED_OUTPATIENT_CLINIC_OR_DEPARTMENT_OTHER): Payer: 59

## 2013-11-22 ENCOUNTER — Ambulatory Visit (HOSPITAL_BASED_OUTPATIENT_CLINIC_OR_DEPARTMENT_OTHER): Payer: 59

## 2013-11-22 VITALS — BP 156/75 | HR 74 | Temp 98.0°F | Resp 18 | Ht 66.0 in | Wt 172.5 lb

## 2013-11-22 DIAGNOSIS — C9000 Multiple myeloma not having achieved remission: Secondary | ICD-10-CM

## 2013-11-22 DIAGNOSIS — Z5112 Encounter for antineoplastic immunotherapy: Secondary | ICD-10-CM

## 2013-11-22 DIAGNOSIS — Z5111 Encounter for antineoplastic chemotherapy: Secondary | ICD-10-CM

## 2013-11-22 LAB — CBC WITH DIFFERENTIAL/PLATELET
BASO%: 0.2 % (ref 0.0–2.0)
BASOS ABS: 0 10*3/uL (ref 0.0–0.1)
EOS%: 1.1 % (ref 0.0–7.0)
Eosinophils Absolute: 0.1 10*3/uL (ref 0.0–0.5)
HCT: 23 % — ABNORMAL LOW (ref 34.8–46.6)
HGB: 7.5 g/dL — ABNORMAL LOW (ref 11.6–15.9)
LYMPH#: 1.7 10*3/uL (ref 0.9–3.3)
LYMPH%: 38.2 % (ref 14.0–49.7)
MCH: 29.5 pg (ref 25.1–34.0)
MCHC: 32.6 g/dL (ref 31.5–36.0)
MCV: 90.6 fL (ref 79.5–101.0)
MONO#: 0.4 10*3/uL (ref 0.1–0.9)
MONO%: 8.3 % (ref 0.0–14.0)
NEUT#: 2.3 10*3/uL (ref 1.5–6.5)
NEUT%: 52.2 % (ref 38.4–76.8)
PLATELETS: 133 10*3/uL — AB (ref 145–400)
RBC: 2.54 10*6/uL — ABNORMAL LOW (ref 3.70–5.45)
RDW: 15.2 % — ABNORMAL HIGH (ref 11.2–14.5)
WBC: 4.4 10*3/uL (ref 3.9–10.3)
nRBC: 0 % (ref 0–0)

## 2013-11-22 LAB — COMPREHENSIVE METABOLIC PANEL (CC13)
ALBUMIN: 3.1 g/dL — AB (ref 3.5–5.0)
ALT: 24 U/L (ref 0–55)
ANION GAP: 10 meq/L (ref 3–11)
AST: 21 U/L (ref 5–34)
Alkaline Phosphatase: 48 U/L (ref 40–150)
BUN: 19.2 mg/dL (ref 7.0–26.0)
CALCIUM: 9.3 mg/dL (ref 8.4–10.4)
CHLORIDE: 107 meq/L (ref 98–109)
CO2: 22 mEq/L (ref 22–29)
CREATININE: 1.1 mg/dL (ref 0.6–1.1)
GLUCOSE: 86 mg/dL (ref 70–140)
POTASSIUM: 4 meq/L (ref 3.5–5.1)
Sodium: 138 mEq/L (ref 136–145)
Total Bilirubin: 0.32 mg/dL (ref 0.20–1.20)
Total Protein: 9.2 g/dL — ABNORMAL HIGH (ref 6.4–8.3)

## 2013-11-22 MED ORDER — SODIUM CHLORIDE 0.9 % IV SOLN
Freq: Once | INTRAVENOUS | Status: AC
  Administered 2013-11-22: 12:00:00 via INTRAVENOUS

## 2013-11-22 MED ORDER — SODIUM CHLORIDE 0.9 % IV SOLN
300.0000 mg/m2 | Freq: Once | INTRAVENOUS | Status: AC
  Administered 2013-11-22: 580 mg via INTRAVENOUS
  Filled 2013-11-22: qty 29

## 2013-11-22 MED ORDER — ONDANSETRON 8 MG/NS 50 ML IVPB
INTRAVENOUS | Status: AC
  Filled 2013-11-22: qty 8

## 2013-11-22 MED ORDER — ONDANSETRON 8 MG/50ML IVPB (CHCC)
8.0000 mg | Freq: Once | INTRAVENOUS | Status: AC
  Administered 2013-11-22: 8 mg via INTRAVENOUS

## 2013-11-22 MED ORDER — DEXAMETHASONE 4 MG PO TABS: 40.0000 mg | ORAL_TABLET | Freq: Once | ORAL | Status: DC

## 2013-11-22 MED ORDER — SODIUM CHLORIDE 0.9 % IV SOLN
Freq: Once | INTRAVENOUS | Status: AC
  Administered 2013-11-22: 11:00:00 via INTRAVENOUS

## 2013-11-22 MED ORDER — DEXTROSE 5 % IV SOLN
36.0000 mg/m2 | Freq: Once | INTRAVENOUS | Status: AC
  Administered 2013-11-22: 68 mg via INTRAVENOUS
  Filled 2013-11-22: qty 34

## 2013-11-22 MED ORDER — DEXAMETHASONE 4 MG PO TABS
ORAL_TABLET | ORAL | Status: AC
  Filled 2013-11-22: qty 10

## 2013-11-22 NOTE — Patient Instructions (Signed)
[facility] Discharge Instructions for Patients Receiving Chemotherapy  Today you received the following chemotherapy agents cytoxan/kyprolis   To help prevent nausea and vomiting after your treatment, we encourage you to take your nausea medication as directed  If you develop nausea and vomiting that is not controlled by your nausea medication, call the clinic.   BELOW ARE SYMPTOMS THAT SHOULD BE REPORTED IMMEDIATELY:  *FEVER GREATER THAN 100.5 F  *CHILLS WITH OR WITHOUT FEVER  NAUSEA AND VOMITING THAT IS NOT CONTROLLED WITH YOUR NAUSEA MEDICATION  *UNUSUAL SHORTNESS OF BREATH  *UNUSUAL BRUISING OR BLEEDING  TENDERNESS IN MOUTH AND THROAT WITH OR WITHOUT PRESENCE OF ULCERS  *URINARY PROBLEMS  *BOWEL PROBLEMS  UNUSUAL RASH Items with * indicate a potential emergency and should be followed up as soon as possible.  Feel free to call the clinic you have any questions or concerns. The clinic phone number is [phone].  

## 2013-11-22 NOTE — Progress Notes (Signed)
[facility] [phone]   [phone]  OFFICE PROGRESS NOTE  [name], MD 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 First dose on 11/15/2048  INTERVAL HISTORY: Victoria Gates 63 y.o. female returns to the clinic today for follow up visit accompanied by her husband. The patient is feeling fine today with no specific complaints except for mild fatigue. She tolerated the first cycle of her treatment with Carfilzomib, Cytoxan and dexamethasone fairly well. She denied having any significant fever or chills. She denied having any significant chest pain, shortness of breath, cough or hemoptysis. The patient denied having any significant weight loss or night sweats. She has no nausea or vomiting. She saw Dr. Melba Coon at [facility] and he agreed with the current plan. She is here today to start day 8 of the first cycle of her treatment.   MEDICAL HISTORY: Past Medical History  Diagnosis Date  . COLONIC POLYPS, HX OF 04/24/2007  . OSTEOPOROSIS 04/24/2007  . GERD 09/21/2010  . DM 03/18/2008  . HYPERCHOLESTEROLEMIA 07/03/2009  . ANEMIA-IRON DEFICIENCY 04/24/2007  . ALLERGIC RHINITIS CAUSE UNSPECIFIED 01/02/2009  . ESOPHAGEAL STRICTURE 04/15/2007    s/p dilitation  . HIATAL HERNIA 04/15/2007  . CERVICAL RADICULOPATHY, LEFT 02/01/2010  . 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 Chest pain 03/2012    a. 04/15/2012 Ex MV: Ex time 10 mins, EF 63%, no ischemia/infarct.  Occas PAC's/PVC's.    ALLERGIES:  is allergic to atorvastatin; rosuvastatin; sulfamethoxazole; and sulfonamide derivatives.  MEDICATIONS:  Current Outpatient Prescriptions  Medication Sig Dispense Refill  . acyclovir (ZOVIRAX) 400 MG tablet Take 1 tablet (400 mg total) by mouth 2 (two) times daily.  60 tablet  4  .  aspirin 81 MG tablet Take 81 mg by mouth daily.       . Calcium 500-125 MG-UNIT TABS Take 1 tablet by mouth daily.        . cholecalciferol (VITAMIN D) 1000 UNITS tablet Take 1,000 Units by mouth daily.      . colestipol (COLESTID) 1 G tablet Take 1 g by mouth 2 (two) times daily.      Marland Kitchen dexamethasone (DECADRON) 4 MG tablet 10 tablets by mouth every week starting with the first dose of chemotherapy  40 tablet  5  . ezetimibe (ZETIA) 10 MG tablet Take 10 mg by mouth every evening.      Marland Kitchen FeFum-FePoly-FA-B Cmp-C-Biot (INTEGRA PLUS) CAPS Take 1 capsule by mouth every morning.  30 capsule  3  . fish oil-omega-3 fatty acids 1000 MG capsule Take 1 capsule by mouth daily.       . lansoprazole (PREVACID) 30 MG capsule Take 30 mg by mouth daily at 12 noon.      . metFORMIN (GLUMETZA) 500 MG (MOD) 24 hr tablet Take 500 mg by mouth every evening.      . prochlorperazine (COMPAZINE) 10 MG tablet Take 1 tablet (10 mg total) by mouth every 6 (six) hours as needed for nausea or vomiting.  60 tablet  0  . OMEGA-3 FATTY ACIDS-VITAMIN E PO Take 1,000 mcg by mouth daily.       No current facility-administered medications for this visit.    SURGICAL HISTORY:  Past Surgical History  Procedure Laterality Date  . Tubal ligation    . Esophagogastroduodenoscopy  04/15/2007  REVIEW OF SYSTEMS:  Constitutional: positive for fatigue Eyes: negative Ears, nose, mouth, throat, and face: negative Respiratory: negative Cardiovascular: negative Gastrointestinal: negative Genitourinary:negative Integument/breast: negative Hematologic/lymphatic: negative Musculoskeletal:negative Neurological: negative Behavioral/Psych: negative Endocrine: negative Allergic/Immunologic: negative   PHYSICAL EXAMINATION: General appearance: alert, cooperative, fatigued and no distress Head: Normocephalic, without obvious abnormality, atraumatic Neck: no adenopathy, no JVD, supple, symmetrical, trachea midline and thyroid not  enlarged, symmetric, no tenderness/mass/nodules Lymph nodes: Cervical, supraclavicular, and axillary nodes normal. Resp: clear to auscultation bilaterally Back: symmetric, no curvature. ROM normal. No CVA tenderness. Cardio: regular rate and rhythm, S1, S2 normal, no murmur, click, rub or gallop GI: soft, non-tender; bowel sounds normal; no masses,  no organomegaly Extremities: extremities normal, atraumatic, no cyanosis or edema Neurologic: Alert and oriented X 3, normal strength and tone. Normal symmetric reflexes. Normal coordination and gait  ECOG PERFORMANCE STATUS: 1 - Symptomatic but completely ambulatory  Blood pressure 156/75, pulse 74, temperature 98 F (36.7 C), temperature source Oral, resp. rate 18, height _0  (1.676 m), weight 172 lb 8 oz (78.245 kg).  LABORATORY DATA: Lab Results  Component Value Date   WBC 4.4 11/22/2013   HGB 7.5* 11/22/2013   HCT 23.0* 11/22/2013   MCV 90.6 11/22/2013   PLT 133* 11/22/2013      Chemistry      Component Value Date/Time   NA 140 11/15/2013 0751   NA 140 04/14/2013 0916   K 3.8 11/15/2013 0751   K 3.8 04/14/2013 0916   CL 105 04/14/2013 0916   CO2 22 11/15/2013 0751   CO2 28 04/14/2013 0916   BUN 15.9 11/15/2013 0751   BUN 10 04/14/2013 0916   CREATININE 1.2* 11/15/2013 0751   CREATININE 1.0 04/14/2013 0916      Component Value Date/Time   CALCIUM 10.0 11/15/2013 0751   CALCIUM 9.7 04/14/2013 0916   CALCIUM 9.0 12/18/2009 2256   ALKPHOS 43 11/15/2013 0751   ALKPHOS 42 04/14/2013 0916   AST 20 11/15/2013 0751   AST 23 04/14/2013 0916   ALT 13 11/15/2013 0751   ALT 24 04/14/2013 0916   BILITOT 0.24 11/15/2013 0751   BILITOT 0.4 04/14/2013 0916      ASSESSMENT AND PLAN: this is a very pleasant 63 years old African American female who was recently diagnosed with IgA multiple myeloma with significant elevation of IgA over 7000. The bone marrow biopsy and aspirate showed significant elevation of plasma cells up to 48%. The patient also has  elevated free lambda light chain. A skeletal bone survey showed no significant lytic lesions. She is currently undergoing systemic chemotherapy with Carfilzomib, Cytoxan and dexamethasone and tolerating it fairly well. I recommend for the patient to proceed with remaining part of her cycle as scheduled. I would see her back for followup visit in 3 weeks with the start of cycle #2. The patient voices understanding of current disease status and treatment options and is in agreement with the current care plan.  All questions were answered. The patient knows to call the clinic with any problems, questions or concerns. We can certainly see the patient much sooner if necessary.  Disclaimer: This note was dictated with voice recognition software. Similar sounding words can inadvertently be transcribed and may not be corrected upon review.

## 2013-11-22 NOTE — Telephone Encounter (Signed)
gv adn printed appt sched and avs forpt for Feb thru March....sed added tx.

## 2013-11-23 ENCOUNTER — Ambulatory Visit (HOSPITAL_BASED_OUTPATIENT_CLINIC_OR_DEPARTMENT_OTHER): Payer: 59

## 2013-11-23 VITALS — BP 135/71 | HR 80 | Temp 98.1°F | Resp 20

## 2013-11-23 DIAGNOSIS — C9000 Multiple myeloma not having achieved remission: Secondary | ICD-10-CM

## 2013-11-23 DIAGNOSIS — Z5112 Encounter for antineoplastic immunotherapy: Secondary | ICD-10-CM

## 2013-11-23 MED ORDER — SODIUM CHLORIDE 0.9 % IV SOLN
Freq: Once | INTRAVENOUS | Status: AC
  Administered 2013-11-23: 08:00:00 via INTRAVENOUS

## 2013-11-23 MED ORDER — ONDANSETRON 8 MG/NS 50 ML IVPB
INTRAVENOUS | Status: AC
  Filled 2013-11-23: qty 8

## 2013-11-23 MED ORDER — DEXAMETHASONE SODIUM PHOSPHATE 10 MG/ML IJ SOLN
10.0000 mg | Freq: Once | INTRAMUSCULAR | Status: AC
  Administered 2013-11-23: 10 mg via INTRAVENOUS

## 2013-11-23 MED ORDER — DEXTROSE 5 % IV SOLN
36.0000 mg/m2 | Freq: Once | INTRAVENOUS | Status: AC
  Administered 2013-11-23: 68 mg via INTRAVENOUS
  Filled 2013-11-23: qty 34

## 2013-11-23 MED ORDER — ONDANSETRON 8 MG/50ML IVPB (CHCC)
8.0000 mg | Freq: Once | INTRAVENOUS | Status: AC
Start: 2013-11-23 — End: 2013-11-23
  Administered 2013-11-23: 8 mg via INTRAVENOUS

## 2013-11-23 MED ORDER — DEXAMETHASONE SODIUM PHOSPHATE 10 MG/ML IJ SOLN
INTRAMUSCULAR | Status: AC
  Filled 2013-11-23: qty 1

## 2013-11-23 NOTE — Patient Instructions (Signed)
[facility] Discharge Instructions for Patients Receiving Chemotherapy  Today you received the following chemotherapy agents: Kyprolis  To help prevent nausea and vomiting after your treatment, we encourage you to take your nausea medication as prescribed by your physician.   If you develop nausea and vomiting that is not controlled by your nausea medication, call the clinic.   BELOW ARE SYMPTOMS THAT SHOULD BE REPORTED IMMEDIATELY:  *FEVER GREATER THAN 100.5 F  *CHILLS WITH OR WITHOUT FEVER  NAUSEA AND VOMITING THAT IS NOT CONTROLLED WITH YOUR NAUSEA MEDICATION  *UNUSUAL SHORTNESS OF BREATH  *UNUSUAL BRUISING OR BLEEDING  TENDERNESS IN MOUTH AND THROAT WITH OR WITHOUT PRESENCE OF ULCERS  *URINARY PROBLEMS  *BOWEL PROBLEMS  UNUSUAL RASH Items with * indicate a potential emergency and should be followed up as soon as possible.  Feel free to call the clinic you have any questions or concerns. The clinic phone number is [phone].    

## 2013-11-28 ENCOUNTER — Other Ambulatory Visit: Payer: Self-pay | Admitting: Endocrinology

## 2013-11-29 ENCOUNTER — Ambulatory Visit (HOSPITAL_BASED_OUTPATIENT_CLINIC_OR_DEPARTMENT_OTHER): Payer: 59

## 2013-11-29 ENCOUNTER — Other Ambulatory Visit (HOSPITAL_BASED_OUTPATIENT_CLINIC_OR_DEPARTMENT_OTHER): Payer: 59

## 2013-11-29 VITALS — BP 145/81 | HR 87 | Temp 98.8°F | Resp 20

## 2013-11-29 DIAGNOSIS — C9000 Multiple myeloma not having achieved remission: Secondary | ICD-10-CM

## 2013-11-29 DIAGNOSIS — Z5112 Encounter for antineoplastic immunotherapy: Secondary | ICD-10-CM

## 2013-11-29 DIAGNOSIS — Z5111 Encounter for antineoplastic chemotherapy: Secondary | ICD-10-CM

## 2013-11-29 LAB — COMPREHENSIVE METABOLIC PANEL (CC13)
ALBUMIN: 3.3 g/dL — AB (ref 3.5–5.0)
ALT: 15 U/L (ref 0–55)
ANION GAP: 8 meq/L (ref 3–11)
AST: 15 U/L (ref 5–34)
Alkaline Phosphatase: 61 U/L (ref 40–150)
BILIRUBIN TOTAL: 0.39 mg/dL (ref 0.20–1.20)
BUN: 13.7 mg/dL (ref 7.0–26.0)
CO2: 21 meq/L — AB (ref 22–29)
Calcium: 9.1 mg/dL (ref 8.4–10.4)
Chloride: 111 mEq/L — ABNORMAL HIGH (ref 98–109)
Creatinine: 1 mg/dL (ref 0.6–1.1)
Glucose: 116 mg/dl (ref 70–140)
POTASSIUM: 3.8 meq/L (ref 3.5–5.1)
SODIUM: 140 meq/L (ref 136–145)
TOTAL PROTEIN: 8.2 g/dL (ref 6.4–8.3)

## 2013-11-29 LAB — CBC WITH DIFFERENTIAL/PLATELET
BASO%: 0.4 % (ref 0.0–2.0)
BASOS ABS: 0 10*3/uL (ref 0.0–0.1)
EOS ABS: 0 10*3/uL (ref 0.0–0.5)
EOS%: 0.9 % (ref 0.0–7.0)
HCT: 25.1 % — ABNORMAL LOW (ref 34.8–46.6)
HEMOGLOBIN: 8.2 g/dL — AB (ref 11.6–15.9)
LYMPH#: 1.3 10*3/uL (ref 0.9–3.3)
LYMPH%: 28 % (ref 14.0–49.7)
MCH: 29.9 pg (ref 25.1–34.0)
MCHC: 32.7 g/dL (ref 31.5–36.0)
MCV: 91.6 fL (ref 79.5–101.0)
MONO#: 0.5 10*3/uL (ref 0.1–0.9)
MONO%: 10.4 % (ref 0.0–14.0)
NEUT%: 60.3 % (ref 38.4–76.8)
NEUTROS ABS: 2.7 10*3/uL (ref 1.5–6.5)
Platelets: 165 10*3/uL (ref 145–400)
RBC: 2.74 10*6/uL — AB (ref 3.70–5.45)
RDW: 16.3 % — AB (ref 11.2–14.5)
WBC: 4.5 10*3/uL (ref 3.9–10.3)
nRBC: 1 % — ABNORMAL HIGH (ref 0–0)

## 2013-11-29 MED ORDER — SODIUM CHLORIDE 0.9 % IV SOLN
300.0000 mg/m2 | Freq: Once | INTRAVENOUS | Status: AC
  Administered 2013-11-29: 580 mg via INTRAVENOUS
  Filled 2013-11-29: qty 29

## 2013-11-29 MED ORDER — ONDANSETRON 8 MG/50ML IVPB (CHCC)
8.0000 mg | Freq: Once | INTRAVENOUS | Status: AC
  Administered 2013-11-29: 8 mg via INTRAVENOUS

## 2013-11-29 MED ORDER — DEXTROSE 5 % IV SOLN
36.0000 mg/m2 | Freq: Once | INTRAVENOUS | Status: AC
  Administered 2013-11-29: 68 mg via INTRAVENOUS
  Filled 2013-11-29: qty 34

## 2013-11-29 MED ORDER — DEXAMETHASONE SODIUM PHOSPHATE 10 MG/ML IJ SOLN
INTRAMUSCULAR | Status: AC
  Filled 2013-11-29: qty 1

## 2013-11-29 MED ORDER — SODIUM CHLORIDE 0.9 % IV SOLN
Freq: Once | INTRAVENOUS | Status: AC
  Administered 2013-11-29: 10:00:00 via INTRAVENOUS

## 2013-11-29 MED ORDER — ONDANSETRON 8 MG/NS 50 ML IVPB
INTRAVENOUS | Status: AC
  Filled 2013-11-29: qty 8

## 2013-11-29 MED ORDER — DEXAMETHASONE SODIUM PHOSPHATE 10 MG/ML IJ SOLN
10.0000 mg | Freq: Once | INTRAMUSCULAR | Status: AC
  Administered 2013-11-29: 10 mg via INTRAVENOUS

## 2013-11-29 NOTE — Patient Instructions (Signed)
[facility] Discharge Instructions for Patients Receiving Chemotherapy  Today you received the following chemotherapy agents:  Kyprolis and Cytoxan  To help prevent nausea and vomiting after your treatment, we encourage you to take your nausea medication as ordered per MD.   If you develop nausea and vomiting that is not controlled by your nausea medication, call the clinic.   BELOW ARE SYMPTOMS THAT SHOULD BE REPORTED IMMEDIATELY:  *FEVER GREATER THAN 100.5 F  *CHILLS WITH OR WITHOUT FEVER  NAUSEA AND VOMITING THAT IS NOT CONTROLLED WITH YOUR NAUSEA MEDICATION  *UNUSUAL SHORTNESS OF BREATH  *UNUSUAL BRUISING OR BLEEDING  TENDERNESS IN MOUTH AND THROAT WITH OR WITHOUT PRESENCE OF ULCERS  *URINARY PROBLEMS  *BOWEL PROBLEMS  UNUSUAL RASH Items with * indicate a potential emergency and should be followed up as soon as possible.  Feel free to call the clinic you have any questions or concerns. The clinic phone number is [phone].    

## 2013-11-30 ENCOUNTER — Other Ambulatory Visit: Payer: Self-pay | Admitting: Internal Medicine

## 2013-11-30 ENCOUNTER — Ambulatory Visit (HOSPITAL_BASED_OUTPATIENT_CLINIC_OR_DEPARTMENT_OTHER): Payer: 59

## 2013-11-30 VITALS — BP 162/82 | HR 84 | Temp 98.0°F

## 2013-11-30 DIAGNOSIS — C9000 Multiple myeloma not having achieved remission: Secondary | ICD-10-CM

## 2013-11-30 DIAGNOSIS — Z5112 Encounter for antineoplastic immunotherapy: Secondary | ICD-10-CM

## 2013-11-30 MED ORDER — SODIUM CHLORIDE 0.9 % IV SOLN
Freq: Once | INTRAVENOUS | Status: AC
  Administered 2013-11-30: 09:00:00 via INTRAVENOUS

## 2013-11-30 MED ORDER — ONDANSETRON 8 MG/50ML IVPB (CHCC)
8.0000 mg | Freq: Once | INTRAVENOUS | Status: AC
  Administered 2013-11-30: 8 mg via INTRAVENOUS

## 2013-11-30 MED ORDER — ONDANSETRON 8 MG/NS 50 ML IVPB
INTRAVENOUS | Status: AC
  Filled 2013-11-30: qty 8

## 2013-11-30 MED ORDER — DEXAMETHASONE SODIUM PHOSPHATE 10 MG/ML IJ SOLN
INTRAMUSCULAR | Status: AC
  Filled 2013-11-30: qty 1

## 2013-11-30 MED ORDER — DEXTROSE 5 % IV SOLN
36.0000 mg/m2 | Freq: Once | INTRAVENOUS | Status: AC
  Administered 2013-11-30: 68 mg via INTRAVENOUS
  Filled 2013-11-30: qty 34

## 2013-11-30 MED ORDER — SODIUM CHLORIDE 0.9 % IV SOLN: Freq: Once | INTRAVENOUS | Status: DC

## 2013-11-30 MED ORDER — DEXAMETHASONE SODIUM PHOSPHATE 10 MG/ML IJ SOLN
10.0000 mg | Freq: Once | INTRAMUSCULAR | Status: AC
  Administered 2013-11-30: 10 mg via INTRAVENOUS

## 2013-11-30 NOTE — Patient Instructions (Signed)
[facility] Discharge Instructions for Patients Receiving Chemotherapy  Today you received the following chemotherapy agents kyprolis  To help prevent nausea and vomiting after your treatment, we encourage you to take your nausea medication as directed   If you develop nausea and vomiting that is not controlled by your nausea medication, call the clinic.   BELOW ARE SYMPTOMS THAT SHOULD BE REPORTED IMMEDIATELY:  *FEVER GREATER THAN 100.5 F  *CHILLS WITH OR WITHOUT FEVER  NAUSEA AND VOMITING THAT IS NOT CONTROLLED WITH YOUR NAUSEA MEDICATION  *UNUSUAL SHORTNESS OF BREATH  *UNUSUAL BRUISING OR BLEEDING  TENDERNESS IN MOUTH AND THROAT WITH OR WITHOUT PRESENCE OF ULCERS  *URINARY PROBLEMS  *BOWEL PROBLEMS  UNUSUAL RASH Items with * indicate a potential emergency and should be followed up as soon as possible.  Feel free to call the clinic you have any questions or concerns. The clinic phone number is [phone].  

## 2013-12-06 ENCOUNTER — Other Ambulatory Visit (HOSPITAL_BASED_OUTPATIENT_CLINIC_OR_DEPARTMENT_OTHER): Payer: 59

## 2013-12-06 DIAGNOSIS — C9000 Multiple myeloma not having achieved remission: Secondary | ICD-10-CM

## 2013-12-06 LAB — CBC WITH DIFFERENTIAL/PLATELET
BASO%: 0.2 % (ref 0.0–2.0)
Basophils Absolute: 0 10*3/uL (ref 0.0–0.1)
EOS ABS: 0.1 10*3/uL (ref 0.0–0.5)
EOS%: 1.4 % (ref 0.0–7.0)
HCT: 27.2 % — ABNORMAL LOW (ref 34.8–46.6)
HGB: 8.9 g/dL — ABNORMAL LOW (ref 11.6–15.9)
LYMPH#: 1.2 10*3/uL (ref 0.9–3.3)
LYMPH%: 28.5 % (ref 14.0–49.7)
MCH: 31 pg (ref 25.1–34.0)
MCHC: 32.8 g/dL (ref 31.5–36.0)
MCV: 94.2 fL (ref 79.5–101.0)
MONO#: 0.4 10*3/uL (ref 0.1–0.9)
MONO%: 9.9 % (ref 0.0–14.0)
NEUT%: 60 % (ref 38.4–76.8)
NEUTROS ABS: 2.6 10*3/uL (ref 1.5–6.5)
Platelets: 209 10*3/uL (ref 145–400)
RBC: 2.88 10*6/uL — AB (ref 3.70–5.45)
RDW: 16.3 % — AB (ref 11.2–14.5)
WBC: 4.4 10*3/uL (ref 3.9–10.3)

## 2013-12-06 LAB — COMPREHENSIVE METABOLIC PANEL (CC13)
ALBUMIN: 3.4 g/dL — AB (ref 3.5–5.0)
ALT: 13 U/L (ref 0–55)
ANION GAP: 12 meq/L — AB (ref 3–11)
AST: 12 U/L (ref 5–34)
Alkaline Phosphatase: 73 U/L (ref 40–150)
BILIRUBIN TOTAL: 0.54 mg/dL (ref 0.20–1.20)
BUN: 12 mg/dL (ref 7.0–26.0)
CO2: 21 meq/L — AB (ref 22–29)
Calcium: 9.2 mg/dL (ref 8.4–10.4)
Chloride: 109 mEq/L (ref 98–109)
Creatinine: 1.1 mg/dL (ref 0.6–1.1)
GLUCOSE: 121 mg/dL (ref 70–140)
POTASSIUM: 3.7 meq/L (ref 3.5–5.1)
Sodium: 142 mEq/L (ref 136–145)
Total Protein: 7.6 g/dL (ref 6.4–8.3)

## 2013-12-13 ENCOUNTER — Other Ambulatory Visit: Payer: 59

## 2013-12-13 ENCOUNTER — Telehealth: Payer: Self-pay | Admitting: Internal Medicine

## 2013-12-13 ENCOUNTER — Ambulatory Visit (HOSPITAL_BASED_OUTPATIENT_CLINIC_OR_DEPARTMENT_OTHER): Payer: 59

## 2013-12-13 ENCOUNTER — Encounter: Payer: Self-pay | Admitting: Internal Medicine

## 2013-12-13 ENCOUNTER — Other Ambulatory Visit (HOSPITAL_BASED_OUTPATIENT_CLINIC_OR_DEPARTMENT_OTHER): Payer: 59

## 2013-12-13 ENCOUNTER — Ambulatory Visit (HOSPITAL_BASED_OUTPATIENT_CLINIC_OR_DEPARTMENT_OTHER): Payer: 59 | Admitting: Internal Medicine

## 2013-12-13 VITALS — BP 146/77 | HR 79 | Resp 18 | Ht 66.0 in | Wt 172.0 lb

## 2013-12-13 DIAGNOSIS — C9000 Multiple myeloma not having achieved remission: Secondary | ICD-10-CM

## 2013-12-13 DIAGNOSIS — Z5111 Encounter for antineoplastic chemotherapy: Secondary | ICD-10-CM

## 2013-12-13 DIAGNOSIS — Z5112 Encounter for antineoplastic immunotherapy: Secondary | ICD-10-CM

## 2013-12-13 LAB — CBC WITH DIFFERENTIAL/PLATELET
BASO%: 0.9 % (ref 0.0–2.0)
BASOS ABS: 0 10*3/uL (ref 0.0–0.1)
EOS ABS: 0.1 10*3/uL (ref 0.0–0.5)
EOS%: 2.1 % (ref 0.0–7.0)
HEMATOCRIT: 28.1 % — AB (ref 34.8–46.6)
HEMOGLOBIN: 9.1 g/dL — AB (ref 11.6–15.9)
LYMPH#: 1.3 10*3/uL (ref 0.9–3.3)
LYMPH%: 31.5 % (ref 14.0–49.7)
MCH: 29.7 pg (ref 25.1–34.0)
MCHC: 32.4 g/dL (ref 31.5–36.0)
MCV: 91.8 fL (ref 79.5–101.0)
MONO#: 0.4 10*3/uL (ref 0.1–0.9)
MONO%: 9.2 % (ref 0.0–14.0)
NEUT#: 2.4 10*3/uL (ref 1.5–6.5)
NEUT%: 56.3 % (ref 38.4–76.8)
Platelets: 305 10*3/uL (ref 145–400)
RBC: 3.06 10*6/uL — ABNORMAL LOW (ref 3.70–5.45)
RDW: 16.3 % — ABNORMAL HIGH (ref 11.2–14.5)
WBC: 4.3 10*3/uL (ref 3.9–10.3)
nRBC: 0 % (ref 0–0)

## 2013-12-13 LAB — COMPREHENSIVE METABOLIC PANEL (CC13)
ALT: 14 U/L (ref 0–55)
ANION GAP: 11 meq/L (ref 3–11)
AST: 16 U/L (ref 5–34)
Albumin: 3.6 g/dL (ref 3.5–5.0)
Alkaline Phosphatase: 91 U/L (ref 40–150)
BUN: 13.9 mg/dL (ref 7.0–26.0)
CALCIUM: 9.5 mg/dL (ref 8.4–10.4)
CO2: 21 meq/L — AB (ref 22–29)
Chloride: 109 mEq/L (ref 98–109)
Creatinine: 0.9 mg/dL (ref 0.6–1.1)
Glucose: 118 mg/dl (ref 70–140)
POTASSIUM: 3.8 meq/L (ref 3.5–5.1)
Sodium: 141 mEq/L (ref 136–145)
Total Bilirubin: 0.45 mg/dL (ref 0.20–1.20)
Total Protein: 7.2 g/dL (ref 6.4–8.3)

## 2013-12-13 MED ORDER — SODIUM CHLORIDE 0.9 % IV SOLN
Freq: Once | INTRAVENOUS | Status: AC
  Administered 2013-12-13: 10:00:00 via INTRAVENOUS

## 2013-12-13 MED ORDER — SODIUM CHLORIDE 0.9 % IV SOLN
300.0000 mg/m2 | Freq: Once | INTRAVENOUS | Status: AC
  Administered 2013-12-13: 580 mg via INTRAVENOUS
  Filled 2013-12-13: qty 29

## 2013-12-13 MED ORDER — DEXAMETHASONE SODIUM PHOSPHATE 10 MG/ML IJ SOLN
INTRAMUSCULAR | Status: AC
  Filled 2013-12-13: qty 1

## 2013-12-13 MED ORDER — ONDANSETRON 8 MG/NS 50 ML IVPB
INTRAVENOUS | Status: AC
  Filled 2013-12-13: qty 8

## 2013-12-13 MED ORDER — SODIUM CHLORIDE 0.9 % IV SOLN
Freq: Once | INTRAVENOUS | Status: AC
  Administered 2013-12-13: 11:00:00 via INTRAVENOUS

## 2013-12-13 MED ORDER — ONDANSETRON 8 MG/50ML IVPB (CHCC)
8.0000 mg | Freq: Once | INTRAVENOUS | Status: AC
  Administered 2013-12-13: 8 mg via INTRAVENOUS

## 2013-12-13 MED ORDER — DEXAMETHASONE SODIUM PHOSPHATE 10 MG/ML IJ SOLN
10.0000 mg | Freq: Once | INTRAMUSCULAR | Status: AC
  Administered 2013-12-13: 10 mg via INTRAVENOUS

## 2013-12-13 MED ORDER — DEXTROSE 5 % IV SOLN
36.0000 mg/m2 | Freq: Once | INTRAVENOUS | Status: AC
  Administered 2013-12-13: 68 mg via INTRAVENOUS
  Filled 2013-12-13: qty 34

## 2013-12-13 NOTE — Telephone Encounter (Signed)
gv pt husband appt schedule for march thru may. message to MM re f/u 3/30 - no avail.

## 2013-12-13 NOTE — Patient Instructions (Signed)
[facility] Discharge Instructions for Patients Receiving Chemotherapy  Today you received the following chemotherapy agents Kyprolis and Cytoxan.  To help prevent nausea and vomiting after your treatment, we encourage you to take your nausea medication.   If you develop nausea and vomiting that is not controlled by your nausea medication, call the clinic.   BELOW ARE SYMPTOMS THAT SHOULD BE REPORTED IMMEDIATELY:  *FEVER GREATER THAN 100.5 F  *CHILLS WITH OR WITHOUT FEVER  NAUSEA AND VOMITING THAT IS NOT CONTROLLED WITH YOUR NAUSEA MEDICATION  *UNUSUAL SHORTNESS OF BREATH  *UNUSUAL BRUISING OR BLEEDING  TENDERNESS IN MOUTH AND THROAT WITH OR WITHOUT PRESENCE OF ULCERS  *URINARY PROBLEMS  *BOWEL PROBLEMS  UNUSUAL RASH Items with * indicate a potential emergency and should be followed up as soon as possible.  Feel free to call the clinic you have any questions or concerns. The clinic phone number is [phone].    

## 2013-12-13 NOTE — Progress Notes (Signed)
[facility] [phone]   [phone]  OFFICE PROGRESS NOTE  [name], MD 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 First dose on 11/15/2048  INTERVAL HISTORY: Victoria Gates 63 y.o. female returns to the clinic today for follow up visit accompanied by her husband. The patient is feeling fine today with no specific complaints. She tolerated the first cycle of her treatment with Carfilzomib, Cytoxan and dexamethasone fairly well. She denied having any significant fever or chills. She denied having any significant chest pain, shortness of breath, cough or hemoptysis. The patient denied having any significant weight loss or night sweats. She has no nausea or vomiting. She saw Dr. Melba Coon at [facility] and he agreed with the current plan. She is here today to start cycle #2 of her treatment.   MEDICAL HISTORY: Past Medical History  Diagnosis Date  . COLONIC POLYPS, HX OF 04/24/2007  . OSTEOPOROSIS 04/24/2007  . GERD 09/21/2010  . DM 03/18/2008  . HYPERCHOLESTEROLEMIA 07/03/2009  . ANEMIA-IRON DEFICIENCY 04/24/2007  . ALLERGIC RHINITIS CAUSE UNSPECIFIED 01/02/2009  . ESOPHAGEAL STRICTURE 04/15/2007    s/p dilitation  . HIATAL HERNIA 04/15/2007  . CERVICAL RADICULOPATHY, LEFT 02/01/2010  . 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 Chest pain 03/2012    a. 04/15/2012 Ex MV: Ex time 10 mins, EF 63%, no ischemia/infarct.  Occas PAC's/PVC's.    ALLERGIES:  is allergic to atorvastatin; rosuvastatin; sulfamethoxazole; and sulfonamide derivatives.  MEDICATIONS:  Current Outpatient Prescriptions  Medication Sig Dispense Refill  . acyclovir (ZOVIRAX) 400 MG tablet Take 1 tablet (400 mg total) by mouth 2 (two) times daily.  60 tablet  4  . aspirin 81 MG tablet Take 81 mg by mouth  daily.       . Calcium 500-125 MG-UNIT TABS Take 1 tablet by mouth daily.        . cholecalciferol (VITAMIN D) 1000 UNITS tablet Take 1,000 Units by mouth daily.      . colestipol (COLESTID) 1 G tablet Take 1 g by mouth 2 (two) times daily.      Marland Kitchen dexamethasone (DECADRON) 4 MG tablet 10 tablets by mouth every week starting with the first dose of chemotherapy  40 tablet  5  . ezetimibe (ZETIA) 10 MG tablet Take 10 mg by mouth every evening.      Marland Kitchen FeFum-FePoly-FA-B Cmp-C-Biot (INTEGRA PLUS) CAPS Take 1 capsule by mouth every morning.  30 capsule  3  . fish oil-omega-3 fatty acids 1000 MG capsule Take 1 capsule by mouth daily.       . lansoprazole (PREVACID) 30 MG capsule Take 30 mg by mouth daily at 12 noon.      . metFORMIN (GLUMETZA) 500 MG (MOD) 24 hr tablet Take 500 mg by mouth every evening.      Marland Kitchen OMEGA-3 FATTY ACIDS-VITAMIN E PO Take 1,000 mcg by mouth daily.      . prochlorperazine (COMPAZINE) 10 MG tablet TAKE 1 TABLET (10 MG TOTAL) BY MOUTH EVERY 6 (SIX) HOURS AS NEEDED FOR NAUSEA OR VOMITING.  60 tablet  0   No current facility-administered medications for this visit.    SURGICAL HISTORY:  Past Surgical History  Procedure Laterality Date  . Tubal ligation    . Esophagogastroduodenoscopy  04/15/2007    REVIEW OF SYSTEMS:  Constitutional: positive  for fatigue Eyes: negative Ears, nose, mouth, throat, and face: negative Respiratory: negative Cardiovascular: negative Gastrointestinal: negative Genitourinary:negative Integument/breast: negative Hematologic/lymphatic: negative Musculoskeletal:negative Neurological: negative Behavioral/Psych: negative Endocrine: negative Allergic/Immunologic: negative   PHYSICAL EXAMINATION: General appearance: alert, cooperative, fatigued and no distress Head: Normocephalic, without obvious abnormality, atraumatic Neck: no adenopathy, no JVD, supple, symmetrical, trachea midline and thyroid not enlarged, symmetric, no  tenderness/mass/nodules Lymph nodes: Cervical, supraclavicular, and axillary nodes normal. Resp: clear to auscultation bilaterally Back: symmetric, no curvature. ROM normal. No CVA tenderness. Cardio: regular rate and rhythm, S1, S2 normal, no murmur, click, rub or gallop GI: soft, non-tender; bowel sounds normal; no masses,  no organomegaly Extremities: extremities normal, atraumatic, no cyanosis or edema Neurologic: Alert and oriented X 3, normal strength and tone. Normal symmetric reflexes. Normal coordination and gait  ECOG PERFORMANCE STATUS: 1 - Symptomatic but completely ambulatory  Blood pressure 146/77, pulse 79, resp. rate 18, height $RemoveBe'5\' 6"'NWiFPopzk$  (1.676 m), weight 172 lb (78.019 kg).  LABORATORY DATA: Lab Results  Component Value Date   WBC 4.3 12/13/2013   HGB 9.1* 12/13/2013   HCT 28.1* 12/13/2013   MCV 91.8 12/13/2013   PLT 305 12/13/2013      Chemistry      Component Value Date/Time   NA 142 12/06/2013 0828   NA 140 04/14/2013 0916   K 3.7 12/06/2013 0828   K 3.8 04/14/2013 0916   CL 105 04/14/2013 0916   CO2 21* 12/06/2013 0828   CO2 28 04/14/2013 0916   BUN 12.0 12/06/2013 0828   BUN 10 04/14/2013 0916   CREATININE 1.1 12/06/2013 0828   CREATININE 1.0 04/14/2013 0916      Component Value Date/Time   CALCIUM 9.2 12/06/2013 0828   CALCIUM 9.7 04/14/2013 0916   CALCIUM 9.0 12/18/2009 2256   ALKPHOS 73 12/06/2013 0828   ALKPHOS 42 04/14/2013 0916   AST 12 12/06/2013 0828   AST 23 04/14/2013 0916   ALT 13 12/06/2013 0828   ALT 24 04/14/2013 0916   BILITOT 0.54 12/06/2013 0828   BILITOT 0.4 04/14/2013 0916      ASSESSMENT AND PLAN: this is a very pleasant 63 years old [place] American female who was recently diagnosed with IgA multiple myeloma with significant elevation of IgA over 7000. The bone marrow biopsy and aspirate showed significant elevation of plasma cells up to 48%. The patient also has elevated free lambda light chain. A skeletal bone survey showed no significant lytic lesions. She  is currently undergoing systemic chemotherapy with Carfilzomib, Cytoxan and dexamethasone and tolerating it fairly well.  She status post 1 cycle. We will proceed with cycle #2 today as scheduled. She would come back for followup visit in 2 weeks for evaluation and management any adverse effect of her treatment. She was advised to call immediately she has any concerning symptoms in the interval. The patient voices understanding of current disease status and treatment options and is in agreement with the current care plan.  All questions were answered. The patient knows to call the clinic with any problems, questions or concerns. We can certainly see the patient much sooner if necessary.  Disclaimer: This note was dictated with voice recognition software. Similar sounding words can inadvertently be transcribed and may not be corrected upon review.

## 2013-12-14 ENCOUNTER — Ambulatory Visit (HOSPITAL_BASED_OUTPATIENT_CLINIC_OR_DEPARTMENT_OTHER): Payer: 59

## 2013-12-14 ENCOUNTER — Telehealth: Payer: Self-pay | Admitting: Internal Medicine

## 2013-12-14 VITALS — BP 157/81 | HR 67 | Temp 98.1°F

## 2013-12-14 DIAGNOSIS — C9000 Multiple myeloma not having achieved remission: Secondary | ICD-10-CM

## 2013-12-14 DIAGNOSIS — Z5112 Encounter for antineoplastic immunotherapy: Secondary | ICD-10-CM

## 2013-12-14 MED ORDER — DEXAMETHASONE SODIUM PHOSPHATE 10 MG/ML IJ SOLN
10.0000 mg | Freq: Once | INTRAMUSCULAR | Status: AC
  Administered 2013-12-14: 10 mg via INTRAVENOUS

## 2013-12-14 MED ORDER — ONDANSETRON 8 MG/NS 50 ML IVPB
INTRAVENOUS | Status: AC
  Filled 2013-12-14: qty 8

## 2013-12-14 MED ORDER — SODIUM CHLORIDE 0.9 % IV SOLN
Freq: Once | INTRAVENOUS | Status: AC
  Administered 2013-12-14: 08:00:00 via INTRAVENOUS

## 2013-12-14 MED ORDER — DEXTROSE 5 % IV SOLN
36.0000 mg/m2 | Freq: Once | INTRAVENOUS | Status: AC
  Administered 2013-12-14: 68 mg via INTRAVENOUS
  Filled 2013-12-14: qty 34

## 2013-12-14 MED ORDER — ONDANSETRON 8 MG/50ML IVPB (CHCC)
8.0000 mg | Freq: Once | INTRAVENOUS | Status: AC
  Administered 2013-12-14: 8 mg via INTRAVENOUS

## 2013-12-14 NOTE — Telephone Encounter (Signed)
per mohmamed ok to see AJ 3/30. lmonvm for pt re appts for 3/30. pt has other appts and husband was aware i would call re 3/30. pt to get new schedule 3/23

## 2013-12-14 NOTE — Patient Instructions (Signed)
[facility] Discharge Instructions for Patients Receiving Chemotherapy  Today you received the following chemotherapy agents Kyprolis To help prevent nausea and vomiting after your treatment, we encourage you to take your nausea medication as prescribed.  If you develop nausea and vomiting that is not controlled by your nausea medication, call the clinic.   BELOW ARE SYMPTOMS THAT SHOULD BE REPORTED IMMEDIATELY:  *FEVER GREATER THAN 100.5 F  *CHILLS WITH OR WITHOUT FEVER  NAUSEA AND VOMITING THAT IS NOT CONTROLLED WITH YOUR NAUSEA MEDICATION  *UNUSUAL SHORTNESS OF BREATH  *UNUSUAL BRUISING OR BLEEDING  TENDERNESS IN MOUTH AND THROAT WITH OR WITHOUT PRESENCE OF ULCERS  *URINARY PROBLEMS  *BOWEL PROBLEMS  UNUSUAL RASH Items with * indicate a potential emergency and should be followed up as soon as possible.  Feel free to call the clinic you have any questions or concerns. The clinic phone number is [phone].    

## 2013-12-20 ENCOUNTER — Other Ambulatory Visit: Payer: Self-pay | Admitting: Internal Medicine

## 2013-12-20 ENCOUNTER — Ambulatory Visit (HOSPITAL_BASED_OUTPATIENT_CLINIC_OR_DEPARTMENT_OTHER): Payer: 59

## 2013-12-20 ENCOUNTER — Other Ambulatory Visit (HOSPITAL_BASED_OUTPATIENT_CLINIC_OR_DEPARTMENT_OTHER): Payer: 59

## 2013-12-20 VITALS — BP 150/88 | HR 78 | Temp 98.1°F | Resp 17

## 2013-12-20 DIAGNOSIS — Z5111 Encounter for antineoplastic chemotherapy: Secondary | ICD-10-CM

## 2013-12-20 DIAGNOSIS — C9000 Multiple myeloma not having achieved remission: Secondary | ICD-10-CM

## 2013-12-20 DIAGNOSIS — Z5112 Encounter for antineoplastic immunotherapy: Secondary | ICD-10-CM

## 2013-12-20 LAB — CBC WITH DIFFERENTIAL/PLATELET
BASO%: 0.5 % (ref 0.0–2.0)
BASOS ABS: 0 10*3/uL (ref 0.0–0.1)
EOS ABS: 0.1 10*3/uL (ref 0.0–0.5)
EOS%: 2 % (ref 0.0–7.0)
HCT: 29.8 % — ABNORMAL LOW (ref 34.8–46.6)
HGB: 9.6 g/dL — ABNORMAL LOW (ref 11.6–15.9)
LYMPH%: 32.1 % (ref 14.0–49.7)
MCH: 29.4 pg (ref 25.1–34.0)
MCHC: 32.2 g/dL (ref 31.5–36.0)
MCV: 91.1 fL (ref 79.5–101.0)
MONO#: 0.4 10*3/uL (ref 0.1–0.9)
MONO%: 10.9 % (ref 0.0–14.0)
NEUT%: 54.5 % (ref 38.4–76.8)
NEUTROS ABS: 2.2 10*3/uL (ref 1.5–6.5)
Platelets: 152 10*3/uL (ref 145–400)
RBC: 3.27 10*6/uL — ABNORMAL LOW (ref 3.70–5.45)
RDW: 16 % — AB (ref 11.2–14.5)
WBC: 4 10*3/uL (ref 3.9–10.3)
lymph#: 1.3 10*3/uL (ref 0.9–3.3)

## 2013-12-20 LAB — COMPREHENSIVE METABOLIC PANEL (CC13)
ALBUMIN: 3.6 g/dL (ref 3.5–5.0)
ALK PHOS: 80 U/L (ref 40–150)
ALT: 31 U/L (ref 0–55)
ANION GAP: 11 meq/L (ref 3–11)
AST: 25 U/L (ref 5–34)
BUN: 10 mg/dL (ref 7.0–26.0)
CALCIUM: 9.4 mg/dL (ref 8.4–10.4)
CO2: 20 meq/L — AB (ref 22–29)
Chloride: 111 mEq/L — ABNORMAL HIGH (ref 98–109)
Creatinine: 0.9 mg/dL (ref 0.6–1.1)
GLUCOSE: 96 mg/dL (ref 70–140)
POTASSIUM: 3.9 meq/L (ref 3.5–5.1)
Sodium: 141 mEq/L (ref 136–145)
TOTAL PROTEIN: 6.6 g/dL (ref 6.4–8.3)
Total Bilirubin: 0.56 mg/dL (ref 0.20–1.20)

## 2013-12-20 MED ORDER — DEXAMETHASONE SODIUM PHOSPHATE 10 MG/ML IJ SOLN
INTRAMUSCULAR | Status: AC
  Filled 2013-12-20: qty 1

## 2013-12-20 MED ORDER — SODIUM CHLORIDE 0.9 % IV SOLN
Freq: Once | INTRAVENOUS | Status: AC
  Administered 2013-12-20: 09:00:00 via INTRAVENOUS

## 2013-12-20 MED ORDER — ONDANSETRON 8 MG/50ML IVPB (CHCC)
8.0000 mg | Freq: Once | INTRAVENOUS | Status: AC
  Administered 2013-12-20: 8 mg via INTRAVENOUS

## 2013-12-20 MED ORDER — ONDANSETRON 8 MG/NS 50 ML IVPB
INTRAVENOUS | Status: AC
  Filled 2013-12-20: qty 8

## 2013-12-20 MED ORDER — SODIUM CHLORIDE 0.9 % IV SOLN
300.0000 mg/m2 | Freq: Once | INTRAVENOUS | Status: AC
  Administered 2013-12-20: 580 mg via INTRAVENOUS
  Filled 2013-12-20: qty 29

## 2013-12-20 MED ORDER — DEXAMETHASONE SODIUM PHOSPHATE 10 MG/ML IJ SOLN
10.0000 mg | Freq: Once | INTRAMUSCULAR | Status: AC
  Administered 2013-12-20: 10 mg via INTRAVENOUS

## 2013-12-20 MED ORDER — DEXTROSE 5 % IV SOLN
36.0000 mg/m2 | Freq: Once | INTRAVENOUS | Status: AC
  Administered 2013-12-20: 68 mg via INTRAVENOUS
  Filled 2013-12-20: qty 34

## 2013-12-20 NOTE — Patient Instructions (Signed)
[facility] Discharge Instructions for Patients Receiving Chemotherapy  Today you received the following chemotherapy agents: Cytoxan, Kyprolis  To help prevent nausea and vomiting after your treatment, we encourage you to take your nausea medication: Dexamethasone 4 mg: 10 tablets by mouth every week starting with the first dose of chemotherapy, or Compazine 10 mg every 6 hrs as needed.   If you develop nausea and vomiting that is not controlled by your nausea medication, call the clinic.   BELOW ARE SYMPTOMS THAT SHOULD BE REPORTED IMMEDIATELY:  *FEVER GREATER THAN 100.5 F  *CHILLS WITH OR WITHOUT FEVER  NAUSEA AND VOMITING THAT IS NOT CONTROLLED WITH YOUR NAUSEA MEDICATION  *UNUSUAL SHORTNESS OF BREATH  *UNUSUAL BRUISING OR BLEEDING  TENDERNESS IN MOUTH AND THROAT WITH OR WITHOUT PRESENCE OF ULCERS  *URINARY PROBLEMS  *BOWEL PROBLEMS  UNUSUAL RASH Items with * indicate a potential emergency and should be followed up as soon as possible.  Feel free to call the clinic you have any questions or concerns. The clinic phone number is [phone].

## 2013-12-21 ENCOUNTER — Ambulatory Visit (HOSPITAL_BASED_OUTPATIENT_CLINIC_OR_DEPARTMENT_OTHER): Payer: 59

## 2013-12-21 ENCOUNTER — Telehealth: Payer: Self-pay | Admitting: Internal Medicine

## 2013-12-21 VITALS — BP 151/85 | HR 68 | Temp 98.1°F

## 2013-12-21 DIAGNOSIS — C9000 Multiple myeloma not having achieved remission: Secondary | ICD-10-CM

## 2013-12-21 DIAGNOSIS — Z5112 Encounter for antineoplastic immunotherapy: Secondary | ICD-10-CM

## 2013-12-21 MED ORDER — ONDANSETRON 8 MG/NS 50 ML IVPB
INTRAVENOUS | Status: AC
  Filled 2013-12-21: qty 8

## 2013-12-21 MED ORDER — DEXAMETHASONE SODIUM PHOSPHATE 10 MG/ML IJ SOLN
INTRAMUSCULAR | Status: AC
  Filled 2013-12-21: qty 1

## 2013-12-21 MED ORDER — DEXTROSE 5 % IV SOLN
36.0000 mg/m2 | Freq: Once | INTRAVENOUS | Status: AC
  Administered 2013-12-21: 68 mg via INTRAVENOUS
  Filled 2013-12-21: qty 34

## 2013-12-21 MED ORDER — ONDANSETRON 8 MG/50ML IVPB (CHCC)
8.0000 mg | Freq: Once | INTRAVENOUS | Status: AC
  Administered 2013-12-21: 8 mg via INTRAVENOUS

## 2013-12-21 MED ORDER — SODIUM CHLORIDE 0.9 % IV SOLN
Freq: Once | INTRAVENOUS | Status: AC
  Administered 2013-12-21: 09:00:00 via INTRAVENOUS

## 2013-12-21 MED ORDER — DEXAMETHASONE SODIUM PHOSPHATE 10 MG/ML IJ SOLN
10.0000 mg | Freq: Once | INTRAMUSCULAR | Status: AC
  Administered 2013-12-21: 10 mg via INTRAVENOUS

## 2013-12-21 NOTE — Patient Instructions (Signed)
[facility] Discharge Instructions for Patients Receiving Chemotherapy  Today you received the following chemotherapy agents:  Kyprolis  To help prevent nausea and vomiting after your treatment, we encourage you to take your nausea medication as ordered per MD.   If you develop nausea and vomiting that is not controlled by your nausea medication, call the clinic.   BELOW ARE SYMPTOMS THAT SHOULD BE REPORTED IMMEDIATELY:  *FEVER GREATER THAN 100.5 F  *CHILLS WITH OR WITHOUT FEVER  NAUSEA AND VOMITING THAT IS NOT CONTROLLED WITH YOUR NAUSEA MEDICATION  *UNUSUAL SHORTNESS OF BREATH  *UNUSUAL BRUISING OR BLEEDING  TENDERNESS IN MOUTH AND THROAT WITH OR WITHOUT PRESENCE OF ULCERS  *URINARY PROBLEMS  *BOWEL PROBLEMS  UNUSUAL RASH Items with * indicate a potential emergency and should be followed up as soon as possible.  Feel free to call the clinic you have any questions or concerns. The clinic phone number is [phone].

## 2013-12-21 NOTE — Telephone Encounter (Signed)
pt came in and needed morning appts due to husband wanting to come to appt.....lab/est on 3.27 and tx on 3.30 and 3.31....gv and printed pt new sched

## 2013-12-24 ENCOUNTER — Ambulatory Visit (HOSPITAL_BASED_OUTPATIENT_CLINIC_OR_DEPARTMENT_OTHER): Payer: 59 | Admitting: Physician Assistant

## 2013-12-24 ENCOUNTER — Telehealth: Payer: Self-pay | Admitting: *Deleted

## 2013-12-24 ENCOUNTER — Telehealth: Payer: Self-pay | Admitting: Internal Medicine

## 2013-12-24 ENCOUNTER — Other Ambulatory Visit (HOSPITAL_BASED_OUTPATIENT_CLINIC_OR_DEPARTMENT_OTHER): Payer: 59

## 2013-12-24 ENCOUNTER — Encounter: Payer: Self-pay | Admitting: Physician Assistant

## 2013-12-24 VITALS — BP 158/88 | HR 70 | Temp 97.9°F | Resp 18 | Ht 66.0 in | Wt 175.0 lb

## 2013-12-24 DIAGNOSIS — C9000 Multiple myeloma not having achieved remission: Secondary | ICD-10-CM

## 2013-12-24 LAB — CBC WITH DIFFERENTIAL/PLATELET
BASO%: 0 % (ref 0.0–2.0)
BASOS ABS: 0 10*3/uL (ref 0.0–0.1)
EOS%: 1.2 % (ref 0.0–7.0)
Eosinophils Absolute: 0.1 10*3/uL (ref 0.0–0.5)
HCT: 30.1 % — ABNORMAL LOW (ref 34.8–46.6)
HEMOGLOBIN: 10 g/dL — AB (ref 11.6–15.9)
LYMPH%: 29.3 % (ref 14.0–49.7)
MCH: 30.5 pg (ref 25.1–34.0)
MCHC: 33.2 g/dL (ref 31.5–36.0)
MCV: 91.7 fL (ref 79.5–101.0)
MONO#: 0.6 10*3/uL (ref 0.1–0.9)
MONO%: 13.6 % (ref 0.0–14.0)
NEUT#: 2.6 10*3/uL (ref 1.5–6.5)
NEUT%: 55.9 % (ref 38.4–76.8)
PLATELETS: 128 10*3/uL — AB (ref 145–400)
RBC: 3.28 10*6/uL — ABNORMAL LOW (ref 3.70–5.45)
RDW: 15.8 % — ABNORMAL HIGH (ref 11.2–14.5)
WBC: 4.6 10*3/uL (ref 3.9–10.3)
lymph#: 1.3 10*3/uL (ref 0.9–3.3)

## 2013-12-24 LAB — COMPREHENSIVE METABOLIC PANEL (CC13)
ALK PHOS: 75 U/L (ref 40–150)
ALT: 14 U/L (ref 0–55)
AST: 10 U/L (ref 5–34)
Albumin: 3.4 g/dL — ABNORMAL LOW (ref 3.5–5.0)
Anion Gap: 10 mEq/L (ref 3–11)
BILIRUBIN TOTAL: 0.43 mg/dL (ref 0.20–1.20)
BUN: 12.8 mg/dL (ref 7.0–26.0)
CO2: 24 mEq/L (ref 22–29)
Calcium: 9.2 mg/dL (ref 8.4–10.4)
Chloride: 109 mEq/L (ref 98–109)
Creatinine: 1 mg/dL (ref 0.6–1.1)
GLUCOSE: 81 mg/dL (ref 70–140)
Potassium: 3.7 mEq/L (ref 3.5–5.1)
Sodium: 143 mEq/L (ref 136–145)
Total Protein: 6.2 g/dL — ABNORMAL LOW (ref 6.4–8.3)

## 2013-12-24 NOTE — Patient Instructions (Addendum)
Keep your appointments for labs as scheduled. Followup with Dr. Julien Nordmann in 2 weeks prior to the start of her next cycle of chemotherapy

## 2013-12-24 NOTE — Telephone Encounter (Signed)
I have called and moved appt on Monday from 8am to 830am. Appt moved due to called staff meeting

## 2013-12-24 NOTE — Progress Notes (Addendum)
[facility] Telephone:([phone]   Fax:(336) [place] NOTE  [name], MD 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 First dose on 11/15/2048. Status post 1 cycle as well as the days 1, 2, 8 and 9 of cycle #2.  INTERVAL HISTORY: Victoria Gates 63 y.o. female returns to the clinic today for follow up visit accompanied by her husband. The patient is feeling fine today with no specific complaints. She tolerated the first cycle of her treatment with Carfilzomib, Cytoxan and dexamethasone fairly well. She denied having any significant fever or chills. She denied having any significant chest pain, shortness of breath, cough or hemoptysis. The patient denied having any significant weight loss or night sweats. She has no nausea or vomiting. She is here today for symptom management visit prior to cycle #2, day 15 and 16 of her treatment. She reports that she has an eye exam  where they may dilate her eyes as well as a dental cleaning appointments coming up.   MEDICAL HISTORY: Past Medical History  Diagnosis Date  . COLONIC POLYPS, HX OF 04/24/2007  . OSTEOPOROSIS 04/24/2007  . GERD 09/21/2010  . DM 03/18/2008  . HYPERCHOLESTEROLEMIA 07/03/2009  . ANEMIA-IRON DEFICIENCY 04/24/2007  . ALLERGIC RHINITIS CAUSE UNSPECIFIED 01/02/2009  . ESOPHAGEAL STRICTURE 04/15/2007    s/p dilitation  . HIATAL HERNIA 04/15/2007  . CERVICAL RADICULOPATHY, LEFT 02/01/2010  . 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 Chest pain 03/2012    a. 04/15/2012 Ex MV: Ex time 10 mins, EF 63%, no ischemia/infarct.  Occas PAC's/PVC's.    ALLERGIES:  is allergic to atorvastatin; rosuvastatin; sulfamethoxazole; and sulfonamide derivatives.  MEDICATIONS:  Current Outpatient Prescriptions  Medication Sig Dispense  Refill  . acyclovir (ZOVIRAX) 400 MG tablet Take 1 tablet (400 mg total) by mouth 2 (two) times daily.  60 tablet  4  . aspirin 81 MG tablet Take 81 mg by mouth daily.       . Calcium 500-125 MG-UNIT TABS Take 1 tablet by mouth daily.        . cholecalciferol (VITAMIN D) 1000 UNITS tablet Take 1,000 Units by mouth daily.      . colestipol (COLESTID) 1 G tablet Take 1 g by mouth 2 (two) times daily.      Marland Kitchen dexamethasone (DECADRON) 4 MG tablet 10 tablets by mouth every week starting with the first dose of chemotherapy  40 tablet  5  . ezetimibe (ZETIA) 10 MG tablet Take 10 mg by mouth every evening.      Marland Kitchen FeFum-FePoly-FA-B Cmp-C-Biot (INTEGRA PLUS) CAPS Take 1 capsule by mouth every morning.  30 capsule  3  . fish oil-omega-3 fatty acids 1000 MG capsule Take 1 capsule by mouth daily.       . lansoprazole (PREVACID) 30 MG capsule Take 30 mg by mouth daily at 12 noon.      . metFORMIN (GLUMETZA) 500 MG (MOD) 24 hr tablet Take 500 mg by mouth every evening.      . prochlorperazine (COMPAZINE) 10 MG tablet TAKE 1 TABLET (10 MG TOTAL) BY MOUTH EVERY 6 (SIX) HOURS AS NEEDED FOR NAUSEA OR VOMITING.  60 tablet  0  . OMEGA-3 FATTY ACIDS-VITAMIN E PO Take 1,000 mcg by mouth daily.       No current facility-administered medications for  this visit.    SURGICAL HISTORY:  Past Surgical History  Procedure Laterality Date  . Tubal ligation    . Esophagogastroduodenoscopy  04/15/2007    REVIEW OF SYSTEMS:  Constitutional: positive for fatigue Eyes: negative Ears, nose, mouth, throat, and face: negative Respiratory: negative Cardiovascular: negative Gastrointestinal: negative Genitourinary:negative Integument/breast: negative Hematologic/lymphatic: negative Musculoskeletal:negative Neurological: negative Behavioral/Psych: negative Endocrine: negative Allergic/Immunologic: negative   PHYSICAL EXAMINATION: General appearance: alert, cooperative, fatigued and no distress Head: Normocephalic,  without obvious abnormality, atraumatic Neck: no adenopathy, no JVD, supple, symmetrical, trachea midline and thyroid not enlarged, symmetric, no tenderness/mass/nodules Lymph nodes: Cervical, supraclavicular, and axillary nodes normal. Resp: clear to auscultation bilaterally Back: symmetric, no curvature. ROM normal. No CVA tenderness. Cardio: regular rate and rhythm, S1, S2 normal, no murmur, click, rub or gallop GI: soft, non-tender; bowel sounds normal; no masses,  no organomegaly Extremities: extremities normal, atraumatic, no cyanosis or edema Neurologic: Alert and oriented X 3, normal strength and tone. Normal symmetric reflexes. Normal coordination and gait  ECOG PERFORMANCE STATUS: 1 - Symptomatic but completely ambulatory  Blood pressure 158/88, pulse 70, temperature 97.9 F (36.6 C), temperature source Oral, resp. rate 18, height $RemoveBe'5\' 6"'mXMjepPCQ$  (1.676 m), weight 175 lb (79.379 kg), SpO2 100.00%.  LABORATORY DATA: Lab Results  Component Value Date   WBC 4.6 12/24/2013   HGB 10.0* 12/24/2013   HCT 30.1* 12/24/2013   MCV 91.7 12/24/2013   PLT 128* 12/24/2013      Chemistry      Component Value Date/Time   NA 143 12/24/2013 0823   NA 140 04/14/2013 0916   K 3.7 12/24/2013 0823   K 3.8 04/14/2013 0916   CL 105 04/14/2013 0916   CO2 24 12/24/2013 0823   CO2 28 04/14/2013 0916   BUN 12.8 12/24/2013 0823   BUN 10 04/14/2013 0916   CREATININE 1.0 12/24/2013 0823   CREATININE 1.0 04/14/2013 0916      Component Value Date/Time   CALCIUM 9.2 12/24/2013 0823   CALCIUM 9.7 04/14/2013 0916   CALCIUM 9.0 12/18/2009 2256   ALKPHOS 75 12/24/2013 0823   ALKPHOS 42 04/14/2013 0916   AST 10 12/24/2013 0823   AST 23 04/14/2013 0916   ALT 14 12/24/2013 0823   ALT 24 04/14/2013 0916   BILITOT 0.43 12/24/2013 0823   BILITOT 0.4 04/14/2013 0916      ASSESSMENT AND PLAN: this is a very pleasant 63 years old African American female who was recently diagnosed with IgA multiple myeloma with significant elevation of  IgA over 7000. The bone marrow biopsy and aspirate showed significant elevation of plasma cells up to 48%. The patient also has elevated free lambda light chain. A skeletal bone survey showed no significant lytic lesions. She is currently undergoing systemic chemotherapy with Carfilzomib, Cytoxan and dexamethasone and tolerating it fairly well.  She status post 1 cycle. Patient was discussed with and also seen by Dr. Julien Nordmann. We will proceed with cycle #2 , day 15 and 16 as scheduled. In one week she'll return for a lab appointment to obtain repeat protein studies to reevaluate her disease. She'll followup with Dr. Julien Nordmann in 2 weeks prior to the start of cycle #3 to discuss the results of the restaging protein studies.   She was advised to call immediately she has any concerning symptoms in the interval. The patient voices understanding of current disease status and treatment options and is in agreement with the current care plan.  All questions were answered. The patient knows to  call the clinic with any problems, questions or concerns. We can certainly see the patient much sooner if necessary.  Awilda Metro E  ADDENDUM:  Hematology/Oncology Attending:  I had a face to face encounter with the patient. I recommended her care plan. This is a very pleasant 63 years old [place] American female recently diagnosed with multiple myeloma and currently undergoing systemic chemotherapy with Carfilzomib, Cytoxan and dexamethasone. The patient is tolerating her treatment fairly well with no significant adverse effects. We'll proceed with the remaining days of cycle #2 as scheduled. She would come back for follow up visit in 2 weeks after repeating myeloma panel for evaluation of her disease before starting cycle #3. The patient was advised to call immediately if she has any concerning symptoms in the interval.  Disclaimer: This note was dictated with voice recognition software. Similar sounding words can  inadvertently be transcribed and may not be corrected upon review. Eilleen Kempf., MD 12/25/2013

## 2013-12-24 NOTE — Telephone Encounter (Signed)
gv adn printed aptp sched and avs for pt for March adn April

## 2013-12-27 ENCOUNTER — Ambulatory Visit: Payer: 59

## 2013-12-27 ENCOUNTER — Ambulatory Visit (HOSPITAL_BASED_OUTPATIENT_CLINIC_OR_DEPARTMENT_OTHER): Payer: 59

## 2013-12-27 ENCOUNTER — Other Ambulatory Visit: Payer: 59

## 2013-12-27 ENCOUNTER — Ambulatory Visit: Payer: 59 | Admitting: Physician Assistant

## 2013-12-27 VITALS — BP 147/76 | HR 75 | Temp 97.7°F | Resp 18

## 2013-12-27 DIAGNOSIS — Z5111 Encounter for antineoplastic chemotherapy: Secondary | ICD-10-CM

## 2013-12-27 DIAGNOSIS — C9000 Multiple myeloma not having achieved remission: Secondary | ICD-10-CM

## 2013-12-27 DIAGNOSIS — Z5112 Encounter for antineoplastic immunotherapy: Secondary | ICD-10-CM

## 2013-12-27 MED ORDER — ONDANSETRON 8 MG/50ML IVPB (CHCC)
8.0000 mg | Freq: Once | INTRAVENOUS | Status: AC
  Administered 2013-12-27: 8 mg via INTRAVENOUS

## 2013-12-27 MED ORDER — DEXAMETHASONE SODIUM PHOSPHATE 10 MG/ML IJ SOLN
10.0000 mg | Freq: Once | INTRAMUSCULAR | Status: AC
  Administered 2013-12-27: 10 mg via INTRAVENOUS

## 2013-12-27 MED ORDER — SODIUM CHLORIDE 0.9 % IV SOLN
300.0000 mg/m2 | Freq: Once | INTRAVENOUS | Status: AC
  Administered 2013-12-27: 580 mg via INTRAVENOUS
  Filled 2013-12-27: qty 29

## 2013-12-27 MED ORDER — SODIUM CHLORIDE 0.9 % IV SOLN
Freq: Once | INTRAVENOUS | Status: AC
  Administered 2013-12-27: 09:00:00 via INTRAVENOUS

## 2013-12-27 MED ORDER — DEXTROSE 5 % IV SOLN
36.0000 mg/m2 | Freq: Once | INTRAVENOUS | Status: AC
  Administered 2013-12-27: 68 mg via INTRAVENOUS
  Filled 2013-12-27: qty 34

## 2013-12-27 MED ORDER — DEXAMETHASONE SODIUM PHOSPHATE 10 MG/ML IJ SOLN
INTRAMUSCULAR | Status: AC
  Filled 2013-12-27: qty 1

## 2013-12-27 MED ORDER — ONDANSETRON 8 MG/NS 50 ML IVPB
INTRAVENOUS | Status: AC
  Filled 2013-12-27: qty 8

## 2013-12-27 NOTE — Progress Notes (Signed)
Labs used from 12/24/13, all labs within treatment parameters. Patient reports feeling fine, "happy my counts are up". No complaints today. Treatment completed without difficulty.

## 2013-12-27 NOTE — Patient Instructions (Signed)
[facility] Discharge Instructions for Patients Receiving Chemotherapy  Today you received the following chemotherapy agents: Cytoxan, Kyprolis  To help prevent nausea and vomiting after your treatment, we encourage you to take your nausea medication: Compazine 10 mg every 6 hrs as needed.    If you develop nausea and vomiting that is not controlled by your nausea medication, call the clinic.   BELOW ARE SYMPTOMS THAT SHOULD BE REPORTED IMMEDIATELY:  *FEVER GREATER THAN 100.5 F  *CHILLS WITH OR WITHOUT FEVER  NAUSEA AND VOMITING THAT IS NOT CONTROLLED WITH YOUR NAUSEA MEDICATION  *UNUSUAL SHORTNESS OF BREATH  *UNUSUAL BRUISING OR BLEEDING  TENDERNESS IN MOUTH AND THROAT WITH OR WITHOUT PRESENCE OF ULCERS  *URINARY PROBLEMS  *BOWEL PROBLEMS  UNUSUAL RASH Items with * indicate a potential emergency and should be followed up as soon as possible.  Feel free to call the clinic you have any questions or concerns. The clinic phone number is [phone].

## 2013-12-28 ENCOUNTER — Ambulatory Visit (HOSPITAL_BASED_OUTPATIENT_CLINIC_OR_DEPARTMENT_OTHER): Payer: 59

## 2013-12-28 ENCOUNTER — Ambulatory Visit: Payer: 59

## 2013-12-28 ENCOUNTER — Other Ambulatory Visit: Payer: Self-pay | Admitting: *Deleted

## 2013-12-28 VITALS — BP 141/76 | HR 73 | Temp 96.6°F

## 2013-12-28 DIAGNOSIS — Z5112 Encounter for antineoplastic immunotherapy: Secondary | ICD-10-CM

## 2013-12-28 DIAGNOSIS — C9000 Multiple myeloma not having achieved remission: Secondary | ICD-10-CM

## 2013-12-28 MED ORDER — SODIUM CHLORIDE 0.9 % IV SOLN
Freq: Once | INTRAVENOUS | Status: AC
  Administered 2013-12-28: 08:00:00 via INTRAVENOUS

## 2013-12-28 MED ORDER — ONDANSETRON 8 MG/50ML IVPB (CHCC)
8.0000 mg | Freq: Once | INTRAVENOUS | Status: AC
  Administered 2013-12-28: 8 mg via INTRAVENOUS

## 2013-12-28 MED ORDER — DEXAMETHASONE SODIUM PHOSPHATE 10 MG/ML IJ SOLN
INTRAMUSCULAR | Status: AC
  Filled 2013-12-28: qty 1

## 2013-12-28 MED ORDER — CARFILZOMIB CHEMO INJECTION 60 MG
36.0000 mg/m2 | Freq: Once | INTRAVENOUS | Status: AC
  Administered 2013-12-28: 68 mg via INTRAVENOUS
  Filled 2013-12-28: qty 34

## 2013-12-28 MED ORDER — ONDANSETRON 8 MG/NS 50 ML IVPB
INTRAVENOUS | Status: AC
  Filled 2013-12-28: qty 8

## 2013-12-28 MED ORDER — DEXAMETHASONE SODIUM PHOSPHATE 10 MG/ML IJ SOLN
10.0000 mg | Freq: Once | INTRAMUSCULAR | Status: AC
  Administered 2013-12-28: 10 mg via INTRAVENOUS

## 2013-12-28 NOTE — Patient Instructions (Signed)
[facility] Discharge Instructions for Patients Receiving Chemotherapy  Today you received the following chemotherapy agents Kyprolis To help prevent nausea and vomiting after your treatment, we encourage you to take your nausea medication as prescribed.  If you develop nausea and vomiting that is not controlled by your nausea medication, call the clinic.   BELOW ARE SYMPTOMS THAT SHOULD BE REPORTED IMMEDIATELY:  *FEVER GREATER THAN 100.5 F  *CHILLS WITH OR WITHOUT FEVER  NAUSEA AND VOMITING THAT IS NOT CONTROLLED WITH YOUR NAUSEA MEDICATION  *UNUSUAL SHORTNESS OF BREATH  *UNUSUAL BRUISING OR BLEEDING  TENDERNESS IN MOUTH AND THROAT WITH OR WITHOUT PRESENCE OF ULCERS  *URINARY PROBLEMS  *BOWEL PROBLEMS  UNUSUAL RASH Items with * indicate a potential emergency and should be followed up as soon as possible.  Feel free to call the clinic you have any questions or concerns. The clinic phone number is [phone].    

## 2014-01-03 ENCOUNTER — Other Ambulatory Visit (HOSPITAL_BASED_OUTPATIENT_CLINIC_OR_DEPARTMENT_OTHER): Payer: 59

## 2014-01-03 DIAGNOSIS — C9000 Multiple myeloma not having achieved remission: Secondary | ICD-10-CM

## 2014-01-03 LAB — CBC WITH DIFFERENTIAL/PLATELET
BASO%: 0.3 % (ref 0.0–2.0)
Basophils Absolute: 0 10*3/uL (ref 0.0–0.1)
EOS ABS: 0.1 10*3/uL (ref 0.0–0.5)
EOS%: 2.2 % (ref 0.0–7.0)
HCT: 31.3 % — ABNORMAL LOW (ref 34.8–46.6)
HEMOGLOBIN: 10.2 g/dL — AB (ref 11.6–15.9)
LYMPH%: 26.9 % (ref 14.0–49.7)
MCH: 30.5 pg (ref 25.1–34.0)
MCHC: 32.7 g/dL (ref 31.5–36.0)
MCV: 93.3 fL (ref 79.5–101.0)
MONO#: 0.5 10*3/uL (ref 0.1–0.9)
MONO%: 12.3 % (ref 0.0–14.0)
NEUT%: 58.3 % (ref 38.4–76.8)
NEUTROS ABS: 2.4 10*3/uL (ref 1.5–6.5)
Platelets: 202 10*3/uL (ref 145–400)
RBC: 3.36 10*6/uL — AB (ref 3.70–5.45)
RDW: 16.9 % — AB (ref 11.2–14.5)
WBC: 4.1 10*3/uL (ref 3.9–10.3)
lymph#: 1.1 10*3/uL (ref 0.9–3.3)

## 2014-01-03 LAB — COMPREHENSIVE METABOLIC PANEL (CC13)
ALBUMIN: 3.7 g/dL (ref 3.5–5.0)
ALT: 14 U/L (ref 0–55)
ANION GAP: 12 meq/L — AB (ref 3–11)
AST: 16 U/L (ref 5–34)
Alkaline Phosphatase: 65 U/L (ref 40–150)
BUN: 13.3 mg/dL (ref 7.0–26.0)
CALCIUM: 9.1 mg/dL (ref 8.4–10.4)
CO2: 23 meq/L (ref 22–29)
Chloride: 108 mEq/L (ref 98–109)
Creatinine: 0.9 mg/dL (ref 0.6–1.1)
GLUCOSE: 103 mg/dL (ref 70–140)
POTASSIUM: 3.8 meq/L (ref 3.5–5.1)
SODIUM: 143 meq/L (ref 136–145)
TOTAL PROTEIN: 6.3 g/dL — AB (ref 6.4–8.3)
Total Bilirubin: 0.57 mg/dL (ref 0.20–1.20)

## 2014-01-03 LAB — LACTATE DEHYDROGENASE (CC13): LDH: 161 U/L (ref 125–245)

## 2014-01-05 LAB — IGG, IGA, IGM
IgA: 493 mg/dL — ABNORMAL HIGH (ref 69–380)
IgG (Immunoglobin G), Serum: 465 mg/dL — ABNORMAL LOW (ref 690–1700)
IgM, Serum: 9 mg/dL — ABNORMAL LOW (ref 52–322)

## 2014-01-05 LAB — KAPPA/LAMBDA LIGHT CHAINS
KAPPA FREE LGHT CHN: 0.12 mg/dL — AB (ref 0.33–1.94)
Kappa:Lambda Ratio: 0.13 — ABNORMAL LOW (ref 0.26–1.65)
LAMBDA FREE LGHT CHN: 0.9 mg/dL (ref 0.57–2.63)

## 2014-01-05 LAB — BETA 2 MICROGLOBULIN, SERUM: BETA 2 MICROGLOBULIN: 3.47 mg/L — AB (ref <=2.51)

## 2014-01-10 ENCOUNTER — Ambulatory Visit (HOSPITAL_BASED_OUTPATIENT_CLINIC_OR_DEPARTMENT_OTHER): Payer: 59 | Admitting: Internal Medicine

## 2014-01-10 ENCOUNTER — Ambulatory Visit (HOSPITAL_BASED_OUTPATIENT_CLINIC_OR_DEPARTMENT_OTHER): Payer: 59

## 2014-01-10 ENCOUNTER — Other Ambulatory Visit (HOSPITAL_BASED_OUTPATIENT_CLINIC_OR_DEPARTMENT_OTHER): Payer: 59

## 2014-01-10 ENCOUNTER — Telehealth: Payer: Self-pay | Admitting: Internal Medicine

## 2014-01-10 ENCOUNTER — Encounter: Payer: Self-pay | Admitting: Internal Medicine

## 2014-01-10 ENCOUNTER — Telehealth: Payer: Self-pay | Admitting: *Deleted

## 2014-01-10 VITALS — BP 135/79 | HR 88 | Temp 97.6°F | Resp 18 | Ht 66.0 in | Wt 172.0 lb

## 2014-01-10 DIAGNOSIS — C9000 Multiple myeloma not having achieved remission: Secondary | ICD-10-CM

## 2014-01-10 DIAGNOSIS — Z5112 Encounter for antineoplastic immunotherapy: Secondary | ICD-10-CM

## 2014-01-10 LAB — COMPREHENSIVE METABOLIC PANEL (CC13)
ALBUMIN: 4.2 g/dL (ref 3.5–5.0)
ALK PHOS: 78 U/L (ref 40–150)
ALT: 12 U/L (ref 0–55)
AST: 18 U/L (ref 5–34)
Anion Gap: 10 mEq/L (ref 3–11)
BUN: 12.3 mg/dL (ref 7.0–26.0)
CO2: 23 mEq/L (ref 22–29)
CREATININE: 0.9 mg/dL (ref 0.6–1.1)
Calcium: 9.8 mg/dL (ref 8.4–10.4)
Chloride: 110 mEq/L — ABNORMAL HIGH (ref 98–109)
Glucose: 99 mg/dl (ref 70–140)
POTASSIUM: 3.9 meq/L (ref 3.5–5.1)
Sodium: 143 mEq/L (ref 136–145)
Total Bilirubin: 0.44 mg/dL (ref 0.20–1.20)
Total Protein: 7.1 g/dL (ref 6.4–8.3)

## 2014-01-10 LAB — CBC WITH DIFFERENTIAL/PLATELET
BASO%: 0.7 % (ref 0.0–2.0)
Basophils Absolute: 0 10*3/uL (ref 0.0–0.1)
EOS%: 2.9 % (ref 0.0–7.0)
Eosinophils Absolute: 0.2 10*3/uL (ref 0.0–0.5)
HCT: 33.5 % — ABNORMAL LOW (ref 34.8–46.6)
HGB: 10.8 g/dL — ABNORMAL LOW (ref 11.6–15.9)
LYMPH#: 2.1 10*3/uL (ref 0.9–3.3)
LYMPH%: 38.9 % (ref 14.0–49.7)
MCH: 29.6 pg (ref 25.1–34.0)
MCHC: 32.2 g/dL (ref 31.5–36.0)
MCV: 91.8 fL (ref 79.5–101.0)
MONO#: 0.4 10*3/uL (ref 0.1–0.9)
MONO%: 7.8 % (ref 0.0–14.0)
NEUT#: 2.7 10*3/uL (ref 1.5–6.5)
NEUT%: 49.7 % (ref 38.4–76.8)
NRBC: 0 % (ref 0–0)
Platelets: 292 10*3/uL (ref 145–400)
RBC: 3.65 10*6/uL — AB (ref 3.70–5.45)
RDW: 15.5 % — AB (ref 11.2–14.5)
WBC: 5.5 10*3/uL (ref 3.9–10.3)

## 2014-01-10 MED ORDER — ONDANSETRON 8 MG/50ML IVPB (CHCC)
8.0000 mg | Freq: Once | INTRAVENOUS | Status: AC
  Administered 2014-01-10: 8 mg via INTRAVENOUS

## 2014-01-10 MED ORDER — SODIUM CHLORIDE 0.9 % IV SOLN
300.0000 mg/m2 | Freq: Once | INTRAVENOUS | Status: AC
  Administered 2014-01-10: 580 mg via INTRAVENOUS
  Filled 2014-01-10: qty 29

## 2014-01-10 MED ORDER — DEXTROSE 5 % IV SOLN
36.0000 mg/m2 | Freq: Once | INTRAVENOUS | Status: AC
  Administered 2014-01-10: 68 mg via INTRAVENOUS
  Filled 2014-01-10: qty 34

## 2014-01-10 MED ORDER — SODIUM CHLORIDE 0.9 % IV SOLN
Freq: Once | INTRAVENOUS | Status: AC
  Administered 2014-01-10: 10:00:00 via INTRAVENOUS

## 2014-01-10 MED ORDER — DEXAMETHASONE SODIUM PHOSPHATE 10 MG/ML IJ SOLN
INTRAMUSCULAR | Status: AC
  Filled 2014-01-10: qty 1

## 2014-01-10 MED ORDER — DEXAMETHASONE SODIUM PHOSPHATE 10 MG/ML IJ SOLN
10.0000 mg | Freq: Once | INTRAMUSCULAR | Status: AC
  Administered 2014-01-10: 10 mg via INTRAVENOUS

## 2014-01-10 MED ORDER — ONDANSETRON 8 MG/NS 50 ML IVPB
INTRAVENOUS | Status: AC
  Filled 2014-01-10: qty 8

## 2014-01-10 NOTE — Patient Instructions (Signed)
[facility] Discharge Instructions for Patients Receiving Chemotherapy  Today you received the following chemotherapy agents Kyprolis/Cytoxan.  To help prevent nausea and vomiting after your treatment, we encourage you to take your nausea medication as prescribed.   If you develop nausea and vomiting that is not controlled by your nausea medication, call the clinic.   BELOW ARE SYMPTOMS THAT SHOULD BE REPORTED IMMEDIATELY:  *FEVER GREATER THAN 100.5 F  *CHILLS WITH OR WITHOUT FEVER  NAUSEA AND VOMITING THAT IS NOT CONTROLLED WITH YOUR NAUSEA MEDICATION  *UNUSUAL SHORTNESS OF BREATH  *UNUSUAL BRUISING OR BLEEDING  TENDERNESS IN MOUTH AND THROAT WITH OR WITHOUT PRESENCE OF ULCERS  *URINARY PROBLEMS  *BOWEL PROBLEMS  UNUSUAL RASH Items with * indicate a potential emergency and should be followed up as soon as possible.  Feel free to call the clinic you have any questions or concerns. The clinic phone number is [phone].    

## 2014-01-10 NOTE — Telephone Encounter (Signed)
Gave pt appt for lab,md and chemo for April and MAy 2015 °

## 2014-01-10 NOTE — Telephone Encounter (Signed)
Per staff message and POF I have scheduled appts.  JMW  

## 2014-01-10 NOTE — Progress Notes (Signed)
[facility] [phone]   [phone]  OFFICE PROGRESS NOTE  [name], MD 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 First dose on 11/15/2013.  INTERVAL HISTORY: Victoria Gates 63 y.o. female returns to the clinic today for follow up visit accompanied by her husband. The patient is feeling fine today with no specific complaints except for mild fatigue. She tolerated the last 2 cycles of her treatment with Carfilzomib, Cytoxan and dexamethasone fairly well. She denied having any significant fever or chills. She denied having any significant chest pain, shortness of breath, cough or hemoptysis. The patient denied having any significant weight loss or night sweats. She has no nausea or vomiting. She had repeat myeloma panel performed recently and she is here for evaluation and discussion of her lab results  MEDICAL HISTORY: Past Medical History  Diagnosis Date  . COLONIC POLYPS, HX OF 04/24/2007  . OSTEOPOROSIS 04/24/2007  . GERD 09/21/2010  . DM 03/18/2008  . HYPERCHOLESTEROLEMIA 07/03/2009  . ANEMIA-IRON DEFICIENCY 04/24/2007  . ALLERGIC RHINITIS CAUSE UNSPECIFIED 01/02/2009  . ESOPHAGEAL STRICTURE 04/15/2007    s/p dilitation  . HIATAL HERNIA 04/15/2007  . CERVICAL RADICULOPATHY, LEFT 02/01/2010  . 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 Chest pain 03/2012    a. 04/15/2012 Ex MV: Ex time 10 mins, EF 63%, no ischemia/infarct.  Occas PAC's/PVC's.    ALLERGIES:  is allergic to atorvastatin; rosuvastatin; sulfamethoxazole; and sulfonamide derivatives.  MEDICATIONS:  Current Outpatient Prescriptions  Medication Sig Dispense Refill  . acyclovir (ZOVIRAX) 400 MG tablet Take 1 tablet (400 mg total) by mouth 2 (two) times daily.  60 tablet  4  . aspirin 81 MG tablet Take 81 mg by  mouth daily.       . Calcium 500-125 MG-UNIT TABS Take 1 tablet by mouth daily.        . cholecalciferol (VITAMIN D) 1000 UNITS tablet Take 1,000 Units by mouth daily.      . colestipol (COLESTID) 1 G tablet Take 1 g by mouth 2 (two) times daily.      Marland Kitchen dexamethasone (DECADRON) 4 MG tablet 10 tablets by mouth every week starting with the first dose of chemotherapy  40 tablet  5  . ezetimibe (ZETIA) 10 MG tablet Take 10 mg by mouth every evening.      Marland Kitchen FeFum-FePoly-FA-B Cmp-C-Biot (INTEGRA PLUS) CAPS Take 1 capsule by mouth every morning.  30 capsule  3  . fish oil-omega-3 fatty acids 1000 MG capsule Take 1 capsule by mouth daily.       . lansoprazole (PREVACID) 30 MG capsule Take 30 mg by mouth daily at 12 noon.      . OMEGA-3 FATTY ACIDS-VITAMIN E PO Take 1,000 mcg by mouth daily.      . prochlorperazine (COMPAZINE) 10 MG tablet TAKE 1 TABLET (10 MG TOTAL) BY MOUTH EVERY 6 (SIX) HOURS AS NEEDED FOR NAUSEA OR VOMITING.  60 tablet  0  . metFORMIN (GLUMETZA) 500 MG (MOD) 24 hr tablet Take 500 mg by mouth every evening.       No current facility-administered medications for this visit.    SURGICAL HISTORY:  Past Surgical History  Procedure Laterality Date  . Tubal ligation    . Esophagogastroduodenoscopy  04/15/2007    REVIEW OF SYSTEMS:  Constitutional: positive for fatigue Eyes:  negative Ears, nose, mouth, throat, and face: negative Respiratory: negative Cardiovascular: negative Gastrointestinal: negative Genitourinary:negative Integument/breast: negative Hematologic/lymphatic: negative Musculoskeletal:negative Neurological: negative Behavioral/Psych: negative Endocrine: negative Allergic/Immunologic: negative   PHYSICAL EXAMINATION: General appearance: alert, cooperative, fatigued and no distress Head: Normocephalic, without obvious abnormality, atraumatic Neck: no adenopathy, no JVD, supple, symmetrical, trachea midline and thyroid not enlarged, symmetric, no  tenderness/mass/nodules Lymph nodes: Cervical, supraclavicular, and axillary nodes normal. Resp: clear to auscultation bilaterally Back: symmetric, no curvature. ROM normal. No CVA tenderness. Cardio: regular rate and rhythm, S1, S2 normal, no murmur, click, rub or gallop GI: soft, non-tender; bowel sounds normal; no masses,  no organomegaly Extremities: extremities normal, atraumatic, no cyanosis or edema Neurologic: Alert and oriented X 3, normal strength and tone. Normal symmetric reflexes. Normal coordination and gait  ECOG PERFORMANCE STATUS: 1 - Symptomatic but completely ambulatory  Blood pressure 135/79, pulse 88, temperature 97.6 F (36.4 C), temperature source Oral, resp. rate 18, height _0  (1.676 m), weight 172 lb (78.019 kg), SpO2 100.00%.  LABORATORY DATA: Lab Results  Component Value Date   WBC 5.5 01/10/2014   HGB 10.8* 01/10/2014   HCT 33.5* 01/10/2014   MCV 91.8 01/10/2014   PLT 292 01/10/2014      Chemistry      Component Value Date/Time   NA 143 01/03/2014 0800   NA 140 04/14/2013 0916   K 3.8 01/03/2014 0800   K 3.8 04/14/2013 0916   CL 105 04/14/2013 0916   CO2 23 01/03/2014 0800   CO2 28 04/14/2013 0916   BUN 13.3 01/03/2014 0800   BUN 10 04/14/2013 0916   CREATININE 0.9 01/03/2014 0800   CREATININE 1.0 04/14/2013 0916      Component Value Date/Time   CALCIUM 9.1 01/03/2014 0800   CALCIUM 9.7 04/14/2013 0916   CALCIUM 9.0 12/18/2009 2256   ALKPHOS 65 01/03/2014 0800   ALKPHOS 42 04/14/2013 0916   AST 16 01/03/2014 0800   AST 23 04/14/2013 0916   ALT 14 01/03/2014 0800   ALT 24 04/14/2013 0916   BILITOT 0.57 01/03/2014 0800   BILITOT 0.4 04/14/2013 0916     Other lab results: Beta-2 microglobulin 3.47, IgG 465, IgA 493, IgM 9. Free kappa light chain 0.12, free lambda light chain 0.9 with a kappa/lambda ratio 0.13.  ASSESSMENT AND PLAN: this is a very pleasant 63 years old [place] American female who was recently diagnosed with IgA multiple myeloma with significant elevation  of IgA over 7000.  She is currently undergoing systemic chemotherapy with Carfilzomib, Cytoxan and dexamethasone and tolerating it fairly well.  Her recent myeloma panel showed significant improvement in her disease. I discussed the lab result with the patient and her husband. I recommend for the patient to proceed with cycle #3 today as scheduled.   The patient voices understanding of current disease status and treatment options and is in agreement with the current care plan.  All questions were answered. The patient knows to call the clinic with any problems, questions or concerns. We can certainly see the patient much sooner if necessary. I spent 15 minutes of face-to-face counseling with the patient and her husband out of the total visit time 25 minutes.  Disclaimer: This note was dictated with voice recognition software. Similar sounding words can inadvertently be transcribed and may not be corrected upon review.

## 2014-01-11 ENCOUNTER — Ambulatory Visit (HOSPITAL_BASED_OUTPATIENT_CLINIC_OR_DEPARTMENT_OTHER): Payer: 59

## 2014-01-11 VITALS — BP 172/89 | HR 103 | Temp 98.8°F

## 2014-01-11 DIAGNOSIS — Z5112 Encounter for antineoplastic immunotherapy: Secondary | ICD-10-CM

## 2014-01-11 DIAGNOSIS — C9000 Multiple myeloma not having achieved remission: Secondary | ICD-10-CM

## 2014-01-11 MED ORDER — SODIUM CHLORIDE 0.9 % IV SOLN
Freq: Once | INTRAVENOUS | Status: AC
Start: 2014-01-11 — End: 2014-01-11
  Administered 2014-01-11: 09:00:00 via INTRAVENOUS

## 2014-01-11 MED ORDER — DEXTROSE 5 % IV SOLN
36.0000 mg/m2 | Freq: Once | INTRAVENOUS | Status: AC
  Administered 2014-01-11: 68 mg via INTRAVENOUS
  Filled 2014-01-11: qty 34

## 2014-01-11 MED ORDER — DEXAMETHASONE SODIUM PHOSPHATE 10 MG/ML IJ SOLN
INTRAMUSCULAR | Status: AC
  Filled 2014-01-11: qty 1

## 2014-01-11 MED ORDER — DEXAMETHASONE SODIUM PHOSPHATE 10 MG/ML IJ SOLN
10.0000 mg | Freq: Once | INTRAMUSCULAR | Status: AC
  Administered 2014-01-11: 10 mg via INTRAVENOUS

## 2014-01-11 MED ORDER — ONDANSETRON 8 MG/NS 50 ML IVPB
INTRAVENOUS | Status: AC
  Filled 2014-01-11: qty 8

## 2014-01-11 MED ORDER — SODIUM CHLORIDE 0.9 % IV SOLN
Freq: Once | INTRAVENOUS | Status: AC
  Administered 2014-01-11: 08:00:00 via INTRAVENOUS

## 2014-01-11 MED ORDER — ONDANSETRON 8 MG/50ML IVPB (CHCC)
8.0000 mg | Freq: Once | INTRAVENOUS | Status: AC
  Administered 2014-01-11: 8 mg via INTRAVENOUS

## 2014-01-11 NOTE — Patient Instructions (Signed)
[facility] Discharge Instructions for Patients Receiving Chemotherapy  Today you received the following chemotherapy agents Kyprolis.  To help prevent nausea and vomiting after your treatment, we encourage you to take your nausea medication.   If you develop nausea and vomiting that is not controlled by your nausea medication, call the clinic.   BELOW ARE SYMPTOMS THAT SHOULD BE REPORTED IMMEDIATELY:  *FEVER GREATER THAN 100.5 F  *CHILLS WITH OR WITHOUT FEVER  NAUSEA AND VOMITING THAT IS NOT CONTROLLED WITH YOUR NAUSEA MEDICATION  *UNUSUAL SHORTNESS OF BREATH  *UNUSUAL BRUISING OR BLEEDING  TENDERNESS IN MOUTH AND THROAT WITH OR WITHOUT PRESENCE OF ULCERS  *URINARY PROBLEMS  *BOWEL PROBLEMS  UNUSUAL RASH Items with * indicate a potential emergency and should be followed up as soon as possible.  Feel free to call the clinic you have any questions or concerns. The clinic phone number is [phone].    

## 2014-01-17 ENCOUNTER — Ambulatory Visit (HOSPITAL_BASED_OUTPATIENT_CLINIC_OR_DEPARTMENT_OTHER): Payer: 59

## 2014-01-17 ENCOUNTER — Other Ambulatory Visit (HOSPITAL_BASED_OUTPATIENT_CLINIC_OR_DEPARTMENT_OTHER): Payer: 59

## 2014-01-17 VITALS — BP 143/93 | HR 70 | Temp 97.9°F

## 2014-01-17 DIAGNOSIS — Z5111 Encounter for antineoplastic chemotherapy: Secondary | ICD-10-CM

## 2014-01-17 DIAGNOSIS — Z5112 Encounter for antineoplastic immunotherapy: Secondary | ICD-10-CM

## 2014-01-17 DIAGNOSIS — C9000 Multiple myeloma not having achieved remission: Secondary | ICD-10-CM

## 2014-01-17 LAB — CBC WITH DIFFERENTIAL/PLATELET
BASO%: 0.2 % (ref 0.0–2.0)
Basophils Absolute: 0 10*3/uL (ref 0.0–0.1)
EOS%: 3.2 % (ref 0.0–7.0)
Eosinophils Absolute: 0.2 10*3/uL (ref 0.0–0.5)
HCT: 33.8 % — ABNORMAL LOW (ref 34.8–46.6)
HGB: 10.8 g/dL — ABNORMAL LOW (ref 11.6–15.9)
LYMPH%: 24.8 % (ref 14.0–49.7)
MCH: 29.3 pg (ref 25.1–34.0)
MCHC: 32 g/dL (ref 31.5–36.0)
MCV: 91.6 fL (ref 79.5–101.0)
MONO#: 0.5 10*3/uL (ref 0.1–0.9)
MONO%: 10.3 % (ref 0.0–14.0)
NEUT#: 3 10*3/uL (ref 1.5–6.5)
NEUT%: 61.5 % (ref 38.4–76.8)
NRBC: 0 % (ref 0–0)
PLATELETS: 181 10*3/uL (ref 145–400)
RBC: 3.69 10*6/uL — AB (ref 3.70–5.45)
RDW: 15.2 % — ABNORMAL HIGH (ref 11.2–14.5)
WBC: 5 10*3/uL (ref 3.9–10.3)
lymph#: 1.2 10*3/uL (ref 0.9–3.3)

## 2014-01-17 LAB — COMPREHENSIVE METABOLIC PANEL (CC13)
ALT: 9 U/L (ref 0–55)
ANION GAP: 9 meq/L (ref 3–11)
AST: 17 U/L (ref 5–34)
Albumin: 3.9 g/dL (ref 3.5–5.0)
Alkaline Phosphatase: 62 U/L (ref 40–150)
BILIRUBIN TOTAL: 0.61 mg/dL (ref 0.20–1.20)
BUN: 11 mg/dL (ref 7.0–26.0)
CO2: 23 meq/L (ref 22–29)
Calcium: 9.7 mg/dL (ref 8.4–10.4)
Chloride: 109 mEq/L (ref 98–109)
Creatinine: 0.9 mg/dL (ref 0.6–1.1)
Glucose: 105 mg/dl (ref 70–140)
Potassium: 4.1 mEq/L (ref 3.5–5.1)
Sodium: 141 mEq/L (ref 136–145)
Total Protein: 6.5 g/dL (ref 6.4–8.3)

## 2014-01-17 MED ORDER — DEXTROSE 5 % IV SOLN: 36.0000 mg/m2 | Freq: Once | INTRAVENOUS | Status: DC

## 2014-01-17 MED ORDER — DEXAMETHASONE SODIUM PHOSPHATE 10 MG/ML IJ SOLN
INTRAMUSCULAR | Status: AC
  Filled 2014-01-17: qty 1

## 2014-01-17 MED ORDER — ONDANSETRON 8 MG/50ML IVPB (CHCC)
8.0000 mg | Freq: Once | INTRAVENOUS | Status: AC
  Administered 2014-01-17: 8 mg via INTRAVENOUS

## 2014-01-17 MED ORDER — ONDANSETRON 8 MG/NS 50 ML IVPB
INTRAVENOUS | Status: AC
  Filled 2014-01-17: qty 8

## 2014-01-17 MED ORDER — DEXTROSE 5 % IV SOLN
68.0000 mg | Freq: Once | INTRAVENOUS | Status: AC
  Administered 2014-01-17: 68 mg via INTRAVENOUS
  Filled 2014-01-17: qty 34

## 2014-01-17 MED ORDER — SODIUM CHLORIDE 0.9 % IV SOLN
Freq: Once | INTRAVENOUS | Status: AC
  Administered 2014-01-17: 09:00:00 via INTRAVENOUS

## 2014-01-17 MED ORDER — SODIUM CHLORIDE 0.9 % IV SOLN
300.0000 mg/m2 | Freq: Once | INTRAVENOUS | Status: AC
  Administered 2014-01-17: 580 mg via INTRAVENOUS
  Filled 2014-01-17: qty 29

## 2014-01-17 MED ORDER — DEXAMETHASONE SODIUM PHOSPHATE 10 MG/ML IJ SOLN
10.0000 mg | Freq: Once | INTRAMUSCULAR | Status: AC
  Administered 2014-01-17: 10 mg via INTRAVENOUS

## 2014-01-17 NOTE — Patient Instructions (Signed)
[facility] Discharge Instructions for Patients Receiving Chemotherapy  Today you received the following chemotherapy agents kyprolis/cytoxan.   To help prevent nausea and vomiting after your treatment, we encourage you to take your nausea medication as directed.    If you develop nausea and vomiting that is not controlled by your nausea medication, call the clinic.   BELOW ARE SYMPTOMS THAT SHOULD BE REPORTED IMMEDIATELY:  *FEVER GREATER THAN 100.5 F  *CHILLS WITH OR WITHOUT FEVER  NAUSEA AND VOMITING THAT IS NOT CONTROLLED WITH YOUR NAUSEA MEDICATION  *UNUSUAL SHORTNESS OF BREATH  *UNUSUAL BRUISING OR BLEEDING  TENDERNESS IN MOUTH AND THROAT WITH OR WITHOUT PRESENCE OF ULCERS  *URINARY PROBLEMS  *BOWEL PROBLEMS  UNUSUAL RASH Items with * indicate a potential emergency and should be followed up as soon as possible.  Feel free to call the clinic you have any questions or concerns. The clinic phone number is [phone].  

## 2014-01-18 ENCOUNTER — Other Ambulatory Visit: Payer: Self-pay | Admitting: Internal Medicine

## 2014-01-18 ENCOUNTER — Ambulatory Visit (HOSPITAL_BASED_OUTPATIENT_CLINIC_OR_DEPARTMENT_OTHER): Payer: 59

## 2014-01-18 VITALS — BP 159/88 | HR 85 | Temp 98.3°F

## 2014-01-18 DIAGNOSIS — Z5112 Encounter for antineoplastic immunotherapy: Secondary | ICD-10-CM

## 2014-01-18 DIAGNOSIS — C9000 Multiple myeloma not having achieved remission: Secondary | ICD-10-CM

## 2014-01-18 MED ORDER — SODIUM CHLORIDE 0.9 % IV SOLN
Freq: Once | INTRAVENOUS | Status: AC
  Administered 2014-01-18: 08:00:00 via INTRAVENOUS

## 2014-01-18 MED ORDER — DEXAMETHASONE SODIUM PHOSPHATE 10 MG/ML IJ SOLN
10.0000 mg | Freq: Once | INTRAMUSCULAR | Status: AC
  Administered 2014-01-18: 10 mg via INTRAVENOUS

## 2014-01-18 MED ORDER — ONDANSETRON 8 MG/50ML IVPB (CHCC)
8.0000 mg | Freq: Once | INTRAVENOUS | Status: AC
  Administered 2014-01-18: 8 mg via INTRAVENOUS

## 2014-01-18 MED ORDER — DEXAMETHASONE SODIUM PHOSPHATE 10 MG/ML IJ SOLN
INTRAMUSCULAR | Status: AC
  Filled 2014-01-18: qty 1

## 2014-01-18 MED ORDER — ONDANSETRON 8 MG/NS 50 ML IVPB
INTRAVENOUS | Status: AC
  Filled 2014-01-18: qty 8

## 2014-01-18 MED ORDER — DEXTROSE 5 % IV SOLN
36.0000 mg/m2 | Freq: Once | INTRAVENOUS | Status: AC
  Administered 2014-01-18: 68 mg via INTRAVENOUS
  Filled 2014-01-18: qty 34

## 2014-01-18 NOTE — Patient Instructions (Signed)
[facility] Discharge Instructions for Patients Receiving Chemotherapy  Today you received the following chemotherapy agents Kyprolis.  To help prevent nausea and vomiting after your treatment, we encourage you to take your nausea medication.   If you develop nausea and vomiting that is not controlled by your nausea medication, call the clinic.   BELOW ARE SYMPTOMS THAT SHOULD BE REPORTED IMMEDIATELY:  *FEVER GREATER THAN 100.5 F  *CHILLS WITH OR WITHOUT FEVER  NAUSEA AND VOMITING THAT IS NOT CONTROLLED WITH YOUR NAUSEA MEDICATION  *UNUSUAL SHORTNESS OF BREATH  *UNUSUAL BRUISING OR BLEEDING  TENDERNESS IN MOUTH AND THROAT WITH OR WITHOUT PRESENCE OF ULCERS  *URINARY PROBLEMS  *BOWEL PROBLEMS  UNUSUAL RASH Items with * indicate a potential emergency and should be followed up as soon as possible.  Feel free to call the clinic you have any questions or concerns. The clinic phone number is [phone].    

## 2014-01-24 ENCOUNTER — Encounter: Payer: Self-pay | Admitting: Internal Medicine

## 2014-01-24 ENCOUNTER — Ambulatory Visit (HOSPITAL_BASED_OUTPATIENT_CLINIC_OR_DEPARTMENT_OTHER): Payer: 59

## 2014-01-24 ENCOUNTER — Telehealth: Payer: Self-pay | Admitting: Internal Medicine

## 2014-01-24 ENCOUNTER — Other Ambulatory Visit (HOSPITAL_BASED_OUTPATIENT_CLINIC_OR_DEPARTMENT_OTHER): Payer: 59

## 2014-01-24 ENCOUNTER — Ambulatory Visit (HOSPITAL_BASED_OUTPATIENT_CLINIC_OR_DEPARTMENT_OTHER): Payer: 59 | Admitting: Internal Medicine

## 2014-01-24 ENCOUNTER — Other Ambulatory Visit: Payer: 59

## 2014-01-24 VITALS — BP 137/75 | HR 74 | Temp 98.1°F | Resp 18 | Ht 66.0 in | Wt 174.2 lb

## 2014-01-24 DIAGNOSIS — C9000 Multiple myeloma not having achieved remission: Secondary | ICD-10-CM

## 2014-01-24 DIAGNOSIS — Z5112 Encounter for antineoplastic immunotherapy: Secondary | ICD-10-CM

## 2014-01-24 DIAGNOSIS — Z5111 Encounter for antineoplastic chemotherapy: Secondary | ICD-10-CM

## 2014-01-24 LAB — COMPREHENSIVE METABOLIC PANEL (CC13)
ALK PHOS: 61 U/L (ref 40–150)
ALT: 10 U/L (ref 0–55)
AST: 14 U/L (ref 5–34)
Albumin: 3.9 g/dL (ref 3.5–5.0)
Anion Gap: 12 mEq/L — ABNORMAL HIGH (ref 3–11)
BUN: 13.2 mg/dL (ref 7.0–26.0)
CO2: 22 mEq/L (ref 22–29)
Calcium: 9.9 mg/dL (ref 8.4–10.4)
Chloride: 108 mEq/L (ref 98–109)
Creatinine: 1 mg/dL (ref 0.6–1.1)
Glucose: 95 mg/dl (ref 70–140)
Potassium: 3.8 mEq/L (ref 3.5–5.1)
SODIUM: 141 meq/L (ref 136–145)
Total Bilirubin: 0.72 mg/dL (ref 0.20–1.20)
Total Protein: 6.4 g/dL (ref 6.4–8.3)

## 2014-01-24 LAB — CBC WITH DIFFERENTIAL/PLATELET
BASO%: 0.1 % (ref 0.0–2.0)
Basophils Absolute: 0 10*3/uL (ref 0.0–0.1)
EOS ABS: 0.1 10*3/uL (ref 0.0–0.5)
EOS%: 2 % (ref 0.0–7.0)
HEMATOCRIT: 33.4 % — AB (ref 34.8–46.6)
HGB: 10.7 g/dL — ABNORMAL LOW (ref 11.6–15.9)
LYMPH%: 19.3 % (ref 14.0–49.7)
MCH: 29.8 pg (ref 25.1–34.0)
MCHC: 32.1 g/dL (ref 31.5–36.0)
MCV: 92.8 fL (ref 79.5–101.0)
MONO#: 0.6 10*3/uL (ref 0.1–0.9)
MONO%: 9.7 % (ref 0.0–14.0)
NEUT%: 68.9 % (ref 38.4–76.8)
NEUTROS ABS: 4.1 10*3/uL (ref 1.5–6.5)
PLATELETS: 145 10*3/uL (ref 145–400)
RBC: 3.6 10*6/uL — ABNORMAL LOW (ref 3.70–5.45)
RDW: 16.4 % — ABNORMAL HIGH (ref 11.2–14.5)
WBC: 5.9 10*3/uL (ref 3.9–10.3)
lymph#: 1.1 10*3/uL (ref 0.9–3.3)

## 2014-01-24 MED ORDER — ONDANSETRON 8 MG/NS 50 ML IVPB
INTRAVENOUS | Status: AC
  Filled 2014-01-24: qty 8

## 2014-01-24 MED ORDER — SODIUM CHLORIDE 0.9 % IV SOLN
Freq: Once | INTRAVENOUS | Status: AC
  Administered 2014-01-24: 11:00:00 via INTRAVENOUS

## 2014-01-24 MED ORDER — CARFILZOMIB CHEMO INJECTION 60 MG
36.0000 mg/m2 | Freq: Once | INTRAVENOUS | Status: AC
  Administered 2014-01-24: 68 mg via INTRAVENOUS
  Filled 2014-01-24: qty 34

## 2014-01-24 MED ORDER — SODIUM CHLORIDE 0.9 % IV SOLN
300.0000 mg/m2 | Freq: Once | INTRAVENOUS | Status: AC
  Administered 2014-01-24: 580 mg via INTRAVENOUS
  Filled 2014-01-24: qty 29

## 2014-01-24 MED ORDER — DEXAMETHASONE SODIUM PHOSPHATE 10 MG/ML IJ SOLN
INTRAMUSCULAR | Status: AC
  Filled 2014-01-24: qty 1

## 2014-01-24 MED ORDER — DEXAMETHASONE SODIUM PHOSPHATE 10 MG/ML IJ SOLN
10.0000 mg | Freq: Once | INTRAMUSCULAR | Status: AC
  Administered 2014-01-24: 10 mg via INTRAVENOUS

## 2014-01-24 MED ORDER — ONDANSETRON 8 MG/50ML IVPB (CHCC)
8.0000 mg | Freq: Once | INTRAVENOUS | Status: AC
  Administered 2014-01-24: 8 mg via INTRAVENOUS

## 2014-01-24 NOTE — Progress Notes (Signed)
[facility] Telephone:([phone]   Fax:(336) [phone]  OFFICE PROGRESS NOTE  [name], MD 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 First dose on 11/15/2013. Status post 2 cycles and she is here today for day 15 of cycle #3.  INTERVAL HISTORY: Victoria Gates 63 y.o. female returns to the clinic today for follow up visit accompanied by her husband. The patient is feeling fine today with no specific complaints except for mild fatigue. She tolerated the last 2 cycles of her treatment with Carfilzomib, Cytoxan and dexamethasone fairly well. She is currently undergoing cycle #3. She denied having any significant fever or chills. She denied having any significant chest pain, shortness of breath, cough or hemoptysis. The patient denied having any significant weight loss or night sweats. She has no nausea or vomiting.   MEDICAL HISTORY: Past Medical History  Diagnosis Date  . COLONIC POLYPS, HX OF 04/24/2007  . OSTEOPOROSIS 04/24/2007  . GERD 09/21/2010  . DM 03/18/2008  . HYPERCHOLESTEROLEMIA 07/03/2009  . ANEMIA-IRON DEFICIENCY 04/24/2007  . ALLERGIC RHINITIS CAUSE UNSPECIFIED 01/02/2009  . ESOPHAGEAL STRICTURE 04/15/2007    s/p dilitation  . HIATAL HERNIA 04/15/2007  . CERVICAL RADICULOPATHY, LEFT 02/01/2010  . 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 Chest pain 03/2012    a. 04/15/2012 Ex MV: Ex time 10 mins, EF 63%, no ischemia/infarct.  Occas PAC's/PVC's.    ALLERGIES:  is allergic to atorvastatin; rosuvastatin; sulfamethoxazole; and sulfonamide derivatives.  MEDICATIONS:  Current Outpatient Prescriptions  Medication Sig Dispense Refill  . acyclovir (ZOVIRAX) 400 MG tablet Take 1 tablet (400 mg total) by mouth 2 (two) times daily.  60 tablet  4  . aspirin 81 MG tablet Take 81 mg by mouth  daily.       . Calcium 500-125 MG-UNIT TABS Take 1 tablet by mouth daily.        . cholecalciferol (VITAMIN D) 1000 UNITS tablet Take 1,000 Units by mouth daily.      . colestipol (COLESTID) 1 G tablet Take 1 g by mouth 2 (two) times daily.      Marland Kitchen dexamethasone (DECADRON) 4 MG tablet 10 tablets by mouth every week starting with the first dose of chemotherapy  40 tablet  5  . ezetimibe (ZETIA) 10 MG tablet Take 10 mg by mouth every evening.      Marland Kitchen FeFum-FePoly-FA-B Cmp-C-Biot (INTEGRA PLUS) CAPS Take 1 capsule by mouth every morning.  30 capsule  3  . fish oil-omega-3 fatty acids 1000 MG capsule Take 1 capsule by mouth daily.       . lansoprazole (PREVACID) 30 MG capsule Take 30 mg by mouth daily at 12 noon.      . metFORMIN (GLUMETZA) 500 MG (MOD) 24 hr tablet Take 500 mg by mouth every evening.      Marland Kitchen OMEGA-3 FATTY ACIDS-VITAMIN E PO Take 1,000 mcg by mouth daily.      . prochlorperazine (COMPAZINE) 10 MG tablet TAKE 1 TABLET (10 MG TOTAL) BY MOUTH EVERY 6 (SIX) HOURS AS NEEDED FOR NAUSEA OR VOMITING.  60 tablet  0   No current facility-administered medications for this visit.    SURGICAL HISTORY:  Past Surgical History  Procedure Laterality Date  . Tubal ligation    . Esophagogastroduodenoscopy  04/15/2007    REVIEW OF SYSTEMS:  Constitutional: positive  for fatigue Eyes: negative Ears, nose, mouth, throat, and face: negative Respiratory: negative Cardiovascular: negative Gastrointestinal: negative Genitourinary:negative Integument/breast: negative Hematologic/lymphatic: negative Musculoskeletal:negative Neurological: negative Behavioral/Psych: negative Endocrine: negative Allergic/Immunologic: negative   PHYSICAL EXAMINATION: General appearance: alert, cooperative, fatigued and no distress Head: Normocephalic, without obvious abnormality, atraumatic Neck: no adenopathy, no JVD, supple, symmetrical, trachea midline and thyroid not enlarged, symmetric, no  tenderness/mass/nodules Lymph nodes: Cervical, supraclavicular, and axillary nodes normal. Resp: clear to auscultation bilaterally Back: symmetric, no curvature. ROM normal. No CVA tenderness. Cardio: regular rate and rhythm, S1, S2 normal, no murmur, click, rub or gallop GI: soft, non-tender; bowel sounds normal; no masses,  no organomegaly Extremities: extremities normal, atraumatic, no cyanosis or edema Neurologic: Alert and oriented X 3, normal strength and tone. Normal symmetric reflexes. Normal coordination and gait  ECOG PERFORMANCE STATUS: 1 - Symptomatic but completely ambulatory  Blood pressure 137/75, pulse 74, temperature 98.1 F (36.7 C), temperature source Oral, resp. rate 18, height $RemoveBe'5\' 6"'mRyKWhnDF$  (1.676 m), weight 174 lb 3.2 oz (79.017 kg), SpO2 100.00%.  LABORATORY DATA: Lab Results  Component Value Date   WBC 5.9 01/24/2014   HGB 10.7* 01/24/2014   HCT 33.4* 01/24/2014   MCV 92.8 01/24/2014   PLT 145 01/24/2014      Chemistry      Component Value Date/Time   NA 141 01/17/2014 0818   NA 140 04/14/2013 0916   K 4.1 01/17/2014 0818   K 3.8 04/14/2013 0916   CL 105 04/14/2013 0916   CO2 23 01/17/2014 0818   CO2 28 04/14/2013 0916   BUN 11.0 01/17/2014 0818   BUN 10 04/14/2013 0916   CREATININE 0.9 01/17/2014 0818   CREATININE 1.0 04/14/2013 0916      Component Value Date/Time   CALCIUM 9.7 01/17/2014 0818   CALCIUM 9.7 04/14/2013 0916   CALCIUM 9.0 12/18/2009 2256   ALKPHOS 62 01/17/2014 0818   ALKPHOS 42 04/14/2013 0916   AST 17 01/17/2014 0818   AST 23 04/14/2013 0916   ALT 9 01/17/2014 0818   ALT 24 04/14/2013 0916   BILITOT 0.61 01/17/2014 0818   BILITOT 0.4 04/14/2013 0916       ASSESSMENT AND PLAN: this is a very pleasant 63 years old African American female who was recently diagnosed with IgA multiple myeloma with significant elevation of IgA over 7000.  She is currently undergoing systemic chemotherapy with Carfilzomib, Cytoxan and dexamethasone and tolerating it fairly  well.  she status post 2 cycles and currently undergoing cycle #3.  Her recent myeloma panel showed significant improvement in her disease. I recommend for the patient to proceed with  day 15 and 16 of cycle #3 today as scheduled.  She would come back for followup visit in 2 weeks with the start of cycle #4. The patient voices understanding of current disease status and treatment options and is in agreement with the current care plan.  All questions were answered. The patient knows to call the clinic with any problems, questions or concerns. We can certainly see the patient much sooner if necessary.   Disclaimer: This note was dictated with voice recognition software. Similar sounding words can inadvertently be transcribed and may not be corrected upon review.

## 2014-01-24 NOTE — Telephone Encounter (Signed)
gv adn printed appts cehd and avs for pt for April adn May....sed added tx.

## 2014-01-25 ENCOUNTER — Ambulatory Visit (HOSPITAL_BASED_OUTPATIENT_CLINIC_OR_DEPARTMENT_OTHER): Payer: 59

## 2014-01-25 VITALS — BP 149/84 | HR 78 | Temp 98.2°F | Resp 19

## 2014-01-25 DIAGNOSIS — Z5112 Encounter for antineoplastic immunotherapy: Secondary | ICD-10-CM

## 2014-01-25 DIAGNOSIS — C9000 Multiple myeloma not having achieved remission: Secondary | ICD-10-CM

## 2014-01-25 MED ORDER — DEXTROSE 5 % IV SOLN
36.0000 mg/m2 | Freq: Once | INTRAVENOUS | Status: AC
  Administered 2014-01-25: 68 mg via INTRAVENOUS
  Filled 2014-01-25: qty 34

## 2014-01-25 MED ORDER — SODIUM CHLORIDE 0.9 % IV SOLN
Freq: Once | INTRAVENOUS | Status: AC
  Administered 2014-01-25: 08:00:00 via INTRAVENOUS

## 2014-01-25 MED ORDER — ONDANSETRON 8 MG/50ML IVPB (CHCC)
8.0000 mg | Freq: Once | INTRAVENOUS | Status: AC
  Administered 2014-01-25: 8 mg via INTRAVENOUS

## 2014-01-25 MED ORDER — DEXAMETHASONE SODIUM PHOSPHATE 10 MG/ML IJ SOLN
INTRAMUSCULAR | Status: AC
  Filled 2014-01-25: qty 1

## 2014-01-25 MED ORDER — DEXAMETHASONE SODIUM PHOSPHATE 10 MG/ML IJ SOLN
10.0000 mg | Freq: Once | INTRAMUSCULAR | Status: AC
  Administered 2014-01-25: 10 mg via INTRAVENOUS

## 2014-01-25 MED ORDER — ONDANSETRON 8 MG/NS 50 ML IVPB
INTRAVENOUS | Status: AC
  Filled 2014-01-25: qty 8

## 2014-01-25 NOTE — Patient Instructions (Signed)
[facility] Discharge Instructions for Patients Receiving Chemotherapy  Today you received the following chemotherapy agents: Kyprolis  To help prevent nausea and vomiting after your treatment, we encourage you to take your nausea medication: Compazine 10 mg every 6 hrs as needed.    If you develop nausea and vomiting that is not controlled by your nausea medication, call the clinic.   BELOW ARE SYMPTOMS THAT SHOULD BE REPORTED IMMEDIATELY:  *FEVER GREATER THAN 100.5 F  *CHILLS WITH OR WITHOUT FEVER  NAUSEA AND VOMITING THAT IS NOT CONTROLLED WITH YOUR NAUSEA MEDICATION  *UNUSUAL SHORTNESS OF BREATH  *UNUSUAL BRUISING OR BLEEDING  TENDERNESS IN MOUTH AND THROAT WITH OR WITHOUT PRESENCE OF ULCERS  *URINARY PROBLEMS  *BOWEL PROBLEMS  UNUSUAL RASH Items with * indicate a potential emergency and should be followed up as soon as possible.  Feel free to call the clinic you have any questions or concerns. The clinic phone number is [phone].    

## 2014-01-25 NOTE — Progress Notes (Signed)
Infusion completed without difficulty.

## 2014-01-28 ENCOUNTER — Encounter: Payer: Self-pay | Admitting: Internal Medicine

## 2014-01-28 NOTE — Progress Notes (Signed)
Insurance is paying Cytoxan and Kyprolis

## 2014-01-31 ENCOUNTER — Other Ambulatory Visit (HOSPITAL_BASED_OUTPATIENT_CLINIC_OR_DEPARTMENT_OTHER): Payer: 59

## 2014-01-31 DIAGNOSIS — C9000 Multiple myeloma not having achieved remission: Secondary | ICD-10-CM

## 2014-01-31 LAB — CBC WITH DIFFERENTIAL/PLATELET
BASO%: 0.1 % (ref 0.0–2.0)
BASOS ABS: 0 10*3/uL (ref 0.0–0.1)
EOS%: 1.7 % (ref 0.0–7.0)
Eosinophils Absolute: 0.1 10*3/uL (ref 0.0–0.5)
HCT: 32.5 % — ABNORMAL LOW (ref 34.8–46.6)
HEMOGLOBIN: 10.6 g/dL — AB (ref 11.6–15.9)
LYMPH#: 1.3 10*3/uL (ref 0.9–3.3)
LYMPH%: 24.3 % (ref 14.0–49.7)
MCH: 30.1 pg (ref 25.1–34.0)
MCHC: 32.7 g/dL (ref 31.5–36.0)
MCV: 92.1 fL (ref 79.5–101.0)
MONO#: 0.6 10*3/uL (ref 0.1–0.9)
MONO%: 11.2 % (ref 0.0–14.0)
NEUT#: 3.3 10*3/uL (ref 1.5–6.5)
NEUT%: 62.7 % (ref 38.4–76.8)
Platelets: 200 10*3/uL (ref 145–400)
RBC: 3.53 10*6/uL — ABNORMAL LOW (ref 3.70–5.45)
RDW: 16.5 % — AB (ref 11.2–14.5)
WBC: 5.2 10*3/uL (ref 3.9–10.3)

## 2014-01-31 LAB — COMPREHENSIVE METABOLIC PANEL (CC13)
ALT: 11 U/L (ref 0–55)
ANION GAP: 11 meq/L (ref 3–11)
AST: 14 U/L (ref 5–34)
Albumin: 3.8 g/dL (ref 3.5–5.0)
Alkaline Phosphatase: 55 U/L (ref 40–150)
BUN: 13.4 mg/dL (ref 7.0–26.0)
CALCIUM: 9.9 mg/dL (ref 8.4–10.4)
CO2: 21 meq/L — AB (ref 22–29)
CREATININE: 1 mg/dL (ref 0.6–1.1)
Chloride: 109 mEq/L (ref 98–109)
Glucose: 101 mg/dl (ref 70–140)
POTASSIUM: 3.7 meq/L (ref 3.5–5.1)
Sodium: 141 mEq/L (ref 136–145)
Total Bilirubin: 0.68 mg/dL (ref 0.20–1.20)
Total Protein: 6.2 g/dL — ABNORMAL LOW (ref 6.4–8.3)

## 2014-02-07 ENCOUNTER — Encounter: Payer: Self-pay | Admitting: Physician Assistant

## 2014-02-07 ENCOUNTER — Ambulatory Visit (HOSPITAL_BASED_OUTPATIENT_CLINIC_OR_DEPARTMENT_OTHER): Payer: 59 | Admitting: Physician Assistant

## 2014-02-07 ENCOUNTER — Telehealth: Payer: Self-pay | Admitting: Internal Medicine

## 2014-02-07 ENCOUNTER — Other Ambulatory Visit (HOSPITAL_BASED_OUTPATIENT_CLINIC_OR_DEPARTMENT_OTHER): Payer: 59

## 2014-02-07 ENCOUNTER — Ambulatory Visit (HOSPITAL_BASED_OUTPATIENT_CLINIC_OR_DEPARTMENT_OTHER): Payer: 59

## 2014-02-07 VITALS — BP 122/65 | HR 70 | Temp 98.4°F | Resp 20 | Ht 66.0 in | Wt 172.2 lb

## 2014-02-07 DIAGNOSIS — C9000 Multiple myeloma not having achieved remission: Secondary | ICD-10-CM

## 2014-02-07 DIAGNOSIS — Z5112 Encounter for antineoplastic immunotherapy: Secondary | ICD-10-CM

## 2014-02-07 DIAGNOSIS — Z5111 Encounter for antineoplastic chemotherapy: Secondary | ICD-10-CM

## 2014-02-07 LAB — COMPREHENSIVE METABOLIC PANEL (CC13)
ALK PHOS: 60 U/L (ref 40–150)
ALT: 13 U/L (ref 0–55)
AST: 16 U/L (ref 5–34)
Albumin: 3.7 g/dL (ref 3.5–5.0)
Anion Gap: 10 mEq/L (ref 3–11)
BILIRUBIN TOTAL: 0.53 mg/dL (ref 0.20–1.20)
BUN: 14.2 mg/dL (ref 7.0–26.0)
CO2: 21 mEq/L — ABNORMAL LOW (ref 22–29)
Calcium: 9.8 mg/dL (ref 8.4–10.4)
Chloride: 111 mEq/L — ABNORMAL HIGH (ref 98–109)
Creatinine: 0.9 mg/dL (ref 0.6–1.1)
GLUCOSE: 115 mg/dL (ref 70–140)
POTASSIUM: 3.5 meq/L (ref 3.5–5.1)
SODIUM: 142 meq/L (ref 136–145)
Total Protein: 6 g/dL — ABNORMAL LOW (ref 6.4–8.3)

## 2014-02-07 LAB — CBC WITH DIFFERENTIAL/PLATELET
BASO%: 0.9 % (ref 0.0–2.0)
Basophils Absolute: 0 10*3/uL (ref 0.0–0.1)
EOS%: 2.7 % (ref 0.0–7.0)
Eosinophils Absolute: 0.1 10*3/uL (ref 0.0–0.5)
HCT: 31.5 % — ABNORMAL LOW (ref 34.8–46.6)
HEMOGLOBIN: 10 g/dL — AB (ref 11.6–15.9)
LYMPH#: 1.1 10*3/uL (ref 0.9–3.3)
LYMPH%: 25.9 % (ref 14.0–49.7)
MCH: 28.7 pg (ref 25.1–34.0)
MCHC: 31.7 g/dL (ref 31.5–36.0)
MCV: 90.5 fL (ref 79.5–101.0)
MONO#: 0.4 10*3/uL (ref 0.1–0.9)
MONO%: 9.4 % (ref 0.0–14.0)
NEUT#: 2.7 10*3/uL (ref 1.5–6.5)
NEUT%: 61.1 % (ref 38.4–76.8)
NRBC: 0 % (ref 0–0)
Platelets: 269 10*3/uL (ref 145–400)
RBC: 3.48 10*6/uL — ABNORMAL LOW (ref 3.70–5.45)
RDW: 15.4 % — AB (ref 11.2–14.5)
WBC: 4.4 10*3/uL (ref 3.9–10.3)

## 2014-02-07 MED ORDER — DEXAMETHASONE SODIUM PHOSPHATE 10 MG/ML IJ SOLN
10.0000 mg | Freq: Once | INTRAMUSCULAR | Status: AC
  Administered 2014-02-07: 10 mg via INTRAVENOUS

## 2014-02-07 MED ORDER — SODIUM CHLORIDE 0.9 % IV SOLN
300.0000 mg/m2 | Freq: Once | INTRAVENOUS | Status: AC
  Administered 2014-02-07: 580 mg via INTRAVENOUS
  Filled 2014-02-07: qty 29

## 2014-02-07 MED ORDER — ONDANSETRON 8 MG/50ML IVPB (CHCC)
8.0000 mg | Freq: Once | INTRAVENOUS | Status: AC
  Administered 2014-02-07: 8 mg via INTRAVENOUS

## 2014-02-07 MED ORDER — HEPARIN SOD (PORK) LOCK FLUSH 100 UNIT/ML IV SOLN
500.0000 [IU] | Freq: Once | INTRAVENOUS | Status: DC | PRN
  Filled 2014-02-07: qty 5

## 2014-02-07 MED ORDER — DEXAMETHASONE SODIUM PHOSPHATE 10 MG/ML IJ SOLN
INTRAMUSCULAR | Status: AC
  Filled 2014-02-07: qty 1

## 2014-02-07 MED ORDER — ONDANSETRON 8 MG/NS 50 ML IVPB
INTRAVENOUS | Status: AC
  Filled 2014-02-07: qty 8

## 2014-02-07 MED ORDER — SODIUM CHLORIDE 0.9 % IV SOLN
Freq: Once | INTRAVENOUS | Status: AC
  Administered 2014-02-07: 10:00:00 via INTRAVENOUS

## 2014-02-07 MED ORDER — DEXTROSE 5 % IV SOLN
36.0000 mg/m2 | Freq: Once | INTRAVENOUS | Status: AC
  Administered 2014-02-07: 68 mg via INTRAVENOUS
  Filled 2014-02-07: qty 34

## 2014-02-07 MED ORDER — SODIUM CHLORIDE 0.9 % IJ SOLN
10.0000 mL | INTRAMUSCULAR | Status: DC | PRN
  Filled 2014-02-07: qty 10

## 2014-02-07 NOTE — Progress Notes (Signed)
[place] Telephone:(336) [phone]   Fax:(336) [phone]  OFFICE PROGRESS NOTE  [name], MD 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 First dose on 11/15/2013. Status post 3 cycles and she is here today for day 1 of cycle #4.  INTERVAL HISTORY: Victoria Gates 63 y.o. female returns to the clinic today for follow up visit accompanied by her husband. The patient is feeling fine today with no specific complaints except for mild fatigue. She tolerated the last 3 cycles of her treatment with Carfilzomib, Cytoxan and dexamethasone fairly well. She presents to begin  cycle #4. She denied having any significant fever or chills. She denied having any significant chest pain, shortness of breath, cough or hemoptysis. The patient denied having any significant weight loss or night sweats. She has no nausea or vomiting. She denied bone pain.  MEDICAL HISTORY: Past Medical History  Diagnosis Date  . COLONIC POLYPS, HX OF 04/24/2007  . OSTEOPOROSIS 04/24/2007  . GERD 09/21/2010  . DM 03/18/2008  . HYPERCHOLESTEROLEMIA 07/03/2009  . ANEMIA-IRON DEFICIENCY 04/24/2007  . ALLERGIC RHINITIS CAUSE UNSPECIFIED 01/02/2009  . ESOPHAGEAL STRICTURE 04/15/2007    s/p dilitation  . HIATAL HERNIA 04/15/2007  . CERVICAL RADICULOPATHY, LEFT 02/01/2010  . 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 Chest pain 03/2012    a. 04/15/2012 Ex MV: Ex time 10 mins, EF 63%, no ischemia/infarct.  Occas PAC's/PVC's.    ALLERGIES:  is allergic to atorvastatin; rosuvastatin; sulfamethoxazole; and sulfonamide derivatives.  MEDICATIONS:  Current Outpatient Prescriptions  Medication Sig Dispense Refill  . acyclovir (ZOVIRAX) 400 MG tablet Take 1 tablet (400 mg total) by mouth 2 (two) times daily.  60 tablet  4  . aspirin 81 MG tablet Take 81  mg by mouth daily.       . Calcium 500-125 MG-UNIT TABS Take 1 tablet by mouth daily.        . cholecalciferol (VITAMIN D) 1000 UNITS tablet Take 1,000 Units by mouth daily.      . colestipol (COLESTID) 1 G tablet Take 1 g by mouth 2 (two) times daily.      Marland Kitchen dexamethasone (DECADRON) 4 MG tablet 10 tablets by mouth every week starting with the first dose of chemotherapy  40 tablet  5  . ezetimibe (ZETIA) 10 MG tablet Take 10 mg by mouth every evening.      Marland Kitchen FeFum-FePoly-FA-B Cmp-C-Biot (INTEGRA PLUS) CAPS Take 1 capsule by mouth every morning.  30 capsule  3  . fish oil-omega-3 fatty acids 1000 MG capsule Take 1 capsule by mouth daily.       . lansoprazole (PREVACID) 30 MG capsule Take 30 mg by mouth daily at 12 noon.      . metFORMIN (GLUMETZA) 500 MG (MOD) 24 hr tablet Take 500 mg by mouth every evening.      . prochlorperazine (COMPAZINE) 10 MG tablet TAKE 1 TABLET (10 MG TOTAL) BY MOUTH EVERY 6 (SIX) HOURS AS NEEDED FOR NAUSEA OR VOMITING.  60 tablet  0  . OMEGA-3 FATTY ACIDS-VITAMIN E PO Take 1,000 mcg by mouth daily.       No current facility-administered medications for this visit.    SURGICAL HISTORY:  Past Surgical History  Procedure Laterality Date  . Tubal ligation    . Esophagogastroduodenoscopy  04/15/2007    REVIEW OF  SYSTEMS:  Constitutional: positive for fatigue Eyes: negative Ears, nose, mouth, throat, and face: negative Respiratory: negative Cardiovascular: negative Gastrointestinal: negative Genitourinary:negative Integument/breast: negative Hematologic/lymphatic: negative Musculoskeletal:negative Neurological: negative Behavioral/Psych: negative Endocrine: negative Allergic/Immunologic: negative   PHYSICAL EXAMINATION: General appearance: alert, cooperative, fatigued and no distress Head: Normocephalic, without obvious abnormality, atraumatic Neck: no adenopathy, no JVD, supple, symmetrical, trachea midline and thyroid not enlarged, symmetric, no  tenderness/mass/nodules Lymph nodes: Cervical, supraclavicular, and axillary nodes normal. Resp: clear to auscultation bilaterally Back: symmetric, no curvature. ROM normal. No CVA tenderness. Cardio: regular rate and rhythm, S1, S2 normal, no murmur, click, rub or gallop GI: soft, non-tender; bowel sounds normal; no masses,  no organomegaly Extremities: extremities normal, atraumatic, no cyanosis or edema Neurologic: Alert and oriented X 3, normal strength and tone. Normal symmetric reflexes. Normal coordination and gait  ECOG PERFORMANCE STATUS: 1 - Symptomatic but completely ambulatory  Blood pressure 122/65, pulse 70, temperature 98.4 F (36.9 C), temperature source Oral, resp. rate 20, height _0  (1.676 m), weight 172 lb 3.2 oz (78.109 kg), SpO2 100.00%.  LABORATORY DATA: Lab Results  Component Value Date   WBC 4.4 02/07/2014   HGB 10.0* 02/07/2014   HCT 31.5* 02/07/2014   MCV 90.5 02/07/2014   PLT 269 02/07/2014      Chemistry      Component Value Date/Time   NA 142 02/07/2014 0806   NA 140 04/14/2013 0916   K 3.5 02/07/2014 0806   K 3.8 04/14/2013 0916   CL 105 04/14/2013 0916   CO2 21* 02/07/2014 0806   CO2 28 04/14/2013 0916   BUN 14.2 02/07/2014 0806   BUN 10 04/14/2013 0916   CREATININE 0.9 02/07/2014 0806   CREATININE 1.0 04/14/2013 0916      Component Value Date/Time   CALCIUM 9.8 02/07/2014 0806   CALCIUM 9.7 04/14/2013 0916   CALCIUM 9.0 12/18/2009 2256   ALKPHOS 60 02/07/2014 0806   ALKPHOS 42 04/14/2013 0916   AST 16 02/07/2014 0806   AST 23 04/14/2013 0916   ALT 13 02/07/2014 0806   ALT 24 04/14/2013 0916   BILITOT 0.53 02/07/2014 0806   BILITOT 0.4 04/14/2013 0916       ASSESSMENT AND PLAN: this is a very pleasant 63 years old African American female who was recently diagnosed with IgA multiple myeloma with significant elevation of IgA over 7000.  She is currently undergoing systemic chemotherapy with Carfilzomib, Cytoxan and dexamethasone and tolerating it fairly  well.  she status post 3 cycles. The patient was discussed with also seen by Dr. Julien Nordmann. She'll proceed with cycle #4 today as scheduled without dose modifications. We'll plan to do repeat protein studies next week and have her followup in 2 weeks to discuss the results. At her followup appointment in 2 weeks,we will schedule her bone marrow biopsy appointment pending the results of her protein studies. Her last myeloma panel showed significant improvement in her disease.  The patient voices understanding of current disease status and treatment options and is in agreement with the current care plan.  All questions were answered. The patient knows to call the clinic with any problems, questions or concerns. We can certainly see the patient much sooner if necessary.  Carlton Adam, PA-C  ADDENDUM:  Hematology/Oncology Attending:  I had a face to face encounter with the patient today. I recommended her care plan. This is a very pleasant 63 years old [place] American female with multiple myeloma currently undergoing systemic chemotherapy with Carfilzomib, Cytoxan and  dexamethasone status post 3 cycles. She is rating her treatment well with no significant adverse effects. We'll proceed with cycle #4 today as scheduled. The patient would come back for follow up visit in 2 weeks for reevaluation and management any adverse effect of her treatment. I would consider her for repeat myeloma panel and probably bone marrow biopsy and aspirate after completion of cycle #4 before referring her for consideration of autologous peripheral blood stem cell transplant. The patient may also benefit from treatment with Zometa for her multiple myeloma and I would consider starting this in the next few weeks after we receive dental clearance.  Disclaimer: This note was dictated with voice recognition software. Similar sounding words can inadvertently be transcribed and may not be corrected upon review. Curt Bears,  MD 02/07/2014

## 2014-02-07 NOTE — Telephone Encounter (Signed)
Gave pt appt for lab,md and chemo for May, emailed michelle regarding chemo, ML will see pt @ chemo room on 5/27

## 2014-02-07 NOTE — Patient Instructions (Signed)
Continue labs and chemo therapy as scheduled Follow up in 2 weeks with repeat protein studies to re-evaluate your disease

## 2014-02-08 ENCOUNTER — Ambulatory Visit (HOSPITAL_BASED_OUTPATIENT_CLINIC_OR_DEPARTMENT_OTHER): Payer: 59

## 2014-02-08 VITALS — BP 151/64 | HR 72 | Temp 97.5°F | Resp 19

## 2014-02-08 DIAGNOSIS — Z5112 Encounter for antineoplastic immunotherapy: Secondary | ICD-10-CM

## 2014-02-08 DIAGNOSIS — C9000 Multiple myeloma not having achieved remission: Secondary | ICD-10-CM

## 2014-02-08 MED ORDER — DEXAMETHASONE SODIUM PHOSPHATE 10 MG/ML IJ SOLN
INTRAMUSCULAR | Status: AC
  Filled 2014-02-08: qty 1

## 2014-02-08 MED ORDER — SODIUM CHLORIDE 0.9 % IV SOLN
Freq: Once | INTRAVENOUS | Status: AC
  Administered 2014-02-08: 08:00:00 via INTRAVENOUS

## 2014-02-08 MED ORDER — DEXAMETHASONE SODIUM PHOSPHATE 10 MG/ML IJ SOLN
10.0000 mg | Freq: Once | INTRAMUSCULAR | Status: AC
  Administered 2014-02-08: 10 mg via INTRAVENOUS

## 2014-02-08 MED ORDER — DEXTROSE 5 % IV SOLN
36.0000 mg/m2 | Freq: Once | INTRAVENOUS | Status: AC
  Administered 2014-02-08: 68 mg via INTRAVENOUS
  Filled 2014-02-08: qty 34

## 2014-02-08 MED ORDER — ONDANSETRON 8 MG/50ML IVPB (CHCC)
8.0000 mg | Freq: Once | INTRAVENOUS | Status: AC
  Administered 2014-02-08: 8 mg via INTRAVENOUS

## 2014-02-08 MED ORDER — ONDANSETRON 8 MG/NS 50 ML IVPB
INTRAVENOUS | Status: AC
  Filled 2014-02-08: qty 8

## 2014-02-08 NOTE — Patient Instructions (Signed)
[facility] Discharge Instructions for Patients Receiving Chemotherapy  Today you received the following chemotherapy agents: Kyprolis  To help prevent nausea and vomiting after your treatment, we encourage you to take your nausea medication: Compazine 10 mg every 6 hrs as needed.    If you develop nausea and vomiting that is not controlled by your nausea medication, call the clinic.   BELOW ARE SYMPTOMS THAT SHOULD BE REPORTED IMMEDIATELY:  *FEVER GREATER THAN 100.5 F  *CHILLS WITH OR WITHOUT FEVER  NAUSEA AND VOMITING THAT IS NOT CONTROLLED WITH YOUR NAUSEA MEDICATION  *UNUSUAL SHORTNESS OF BREATH  *UNUSUAL BRUISING OR BLEEDING  TENDERNESS IN MOUTH AND THROAT WITH OR WITHOUT PRESENCE OF ULCERS  *URINARY PROBLEMS  *BOWEL PROBLEMS  UNUSUAL RASH Items with * indicate a potential emergency and should be followed up as soon as possible.  Feel free to call the clinic you have any questions or concerns. The clinic phone number is [phone].    

## 2014-02-13 ENCOUNTER — Telehealth: Payer: Self-pay | Admitting: Internal Medicine

## 2014-02-13 NOTE — Telephone Encounter (Signed)
Called pt appt for Monday 5/18th lab and chemo, advised pt to get appt for calendar for May and June 2015

## 2014-02-14 ENCOUNTER — Ambulatory Visit (HOSPITAL_BASED_OUTPATIENT_CLINIC_OR_DEPARTMENT_OTHER): Payer: 59

## 2014-02-14 ENCOUNTER — Other Ambulatory Visit (HOSPITAL_BASED_OUTPATIENT_CLINIC_OR_DEPARTMENT_OTHER): Payer: 59

## 2014-02-14 VITALS — BP 126/89 | HR 77 | Temp 96.2°F | Resp 18

## 2014-02-14 DIAGNOSIS — C9000 Multiple myeloma not having achieved remission: Secondary | ICD-10-CM

## 2014-02-14 DIAGNOSIS — Z5112 Encounter for antineoplastic immunotherapy: Secondary | ICD-10-CM

## 2014-02-14 LAB — COMPREHENSIVE METABOLIC PANEL (CC13)
ALK PHOS: 60 U/L (ref 40–150)
ALT: 17 U/L (ref 0–55)
AST: 13 U/L (ref 5–34)
Albumin: 3.7 g/dL (ref 3.5–5.0)
Anion Gap: 12 mEq/L — ABNORMAL HIGH (ref 3–11)
BILIRUBIN TOTAL: 0.46 mg/dL (ref 0.20–1.20)
BUN: 10.6 mg/dL (ref 7.0–26.0)
CO2: 22 mEq/L (ref 22–29)
Calcium: 9.5 mg/dL (ref 8.4–10.4)
Chloride: 108 mEq/L (ref 98–109)
Creatinine: 0.9 mg/dL (ref 0.6–1.1)
Glucose: 98 mg/dl (ref 70–140)
Potassium: 3.6 mEq/L (ref 3.5–5.1)
SODIUM: 142 meq/L (ref 136–145)
Total Protein: 6.1 g/dL — ABNORMAL LOW (ref 6.4–8.3)

## 2014-02-14 LAB — CBC WITH DIFFERENTIAL/PLATELET
BASO%: 0.2 % (ref 0.0–2.0)
BASOS ABS: 0 10*3/uL (ref 0.0–0.1)
EOS ABS: 0.2 10*3/uL (ref 0.0–0.5)
EOS%: 3.5 % (ref 0.0–7.0)
HCT: 33.4 % — ABNORMAL LOW (ref 34.8–46.6)
HGB: 10.6 g/dL — ABNORMAL LOW (ref 11.6–15.9)
LYMPH%: 25.2 % (ref 14.0–49.7)
MCH: 29 pg (ref 25.1–34.0)
MCHC: 31.7 g/dL (ref 31.5–36.0)
MCV: 91.5 fL (ref 79.5–101.0)
MONO#: 0.5 10*3/uL (ref 0.1–0.9)
MONO%: 9.5 % (ref 0.0–14.0)
NEUT%: 61.6 % (ref 38.4–76.8)
NEUTROS ABS: 3 10*3/uL (ref 1.5–6.5)
Platelets: 157 10*3/uL (ref 145–400)
RBC: 3.65 10*6/uL — AB (ref 3.70–5.45)
RDW: 15.2 % — ABNORMAL HIGH (ref 11.2–14.5)
WBC: 4.8 10*3/uL (ref 3.9–10.3)
lymph#: 1.2 10*3/uL (ref 0.9–3.3)

## 2014-02-14 MED ORDER — SODIUM CHLORIDE 0.9 % IV SOLN
Freq: Once | INTRAVENOUS | Status: AC
  Administered 2014-02-14: 09:00:00 via INTRAVENOUS

## 2014-02-14 MED ORDER — ONDANSETRON 8 MG/NS 50 ML IVPB
INTRAVENOUS | Status: AC
  Filled 2014-02-14: qty 8

## 2014-02-14 MED ORDER — ONDANSETRON 8 MG/50ML IVPB (CHCC)
8.0000 mg | Freq: Once | INTRAVENOUS | Status: AC
  Administered 2014-02-14: 8 mg via INTRAVENOUS

## 2014-02-14 MED ORDER — DEXTROSE 5 % IV SOLN
36.0000 mg/m2 | Freq: Once | INTRAVENOUS | Status: AC
  Administered 2014-02-14: 68 mg via INTRAVENOUS
  Filled 2014-02-14: qty 34

## 2014-02-14 MED ORDER — SODIUM CHLORIDE 0.9 % IV SOLN
300.0000 mg/m2 | Freq: Once | INTRAVENOUS | Status: AC
  Administered 2014-02-14: 580 mg via INTRAVENOUS
  Filled 2014-02-14: qty 29

## 2014-02-14 MED ORDER — DEXAMETHASONE SODIUM PHOSPHATE 10 MG/ML IJ SOLN
INTRAMUSCULAR | Status: AC
  Filled 2014-02-14: qty 1

## 2014-02-14 MED ORDER — DEXAMETHASONE SODIUM PHOSPHATE 10 MG/ML IJ SOLN
10.0000 mg | Freq: Once | INTRAMUSCULAR | Status: AC
  Administered 2014-02-14: 10 mg via INTRAVENOUS

## 2014-02-14 NOTE — Patient Instructions (Signed)
[facility] Discharge Instructions for Patients Receiving Chemotherapy  Today you received the following chemotherapy agents Kyprolis/Cytoxan  To help prevent nausea and vomiting after your treatment, we encourage you to take your nausea medication as directed/prescribed If you develop nausea and vomiting that is not controlled by your nausea medication, call the clinic.   BELOW ARE SYMPTOMS THAT SHOULD BE REPORTED IMMEDIATELY:  *FEVER GREATER THAN 100.5 F  *CHILLS WITH OR WITHOUT FEVER  NAUSEA AND VOMITING THAT IS NOT CONTROLLED WITH YOUR NAUSEA MEDICATION  *UNUSUAL SHORTNESS OF BREATH  *UNUSUAL BRUISING OR BLEEDING  TENDERNESS IN MOUTH AND THROAT WITH OR WITHOUT PRESENCE OF ULCERS  *URINARY PROBLEMS  *BOWEL PROBLEMS  UNUSUAL RASH Items with * indicate a potential emergency and should be followed up as soon as possible.  Feel free to call the clinic you have any questions or concerns. The clinic phone number is [phone].

## 2014-02-15 ENCOUNTER — Ambulatory Visit (HOSPITAL_BASED_OUTPATIENT_CLINIC_OR_DEPARTMENT_OTHER): Payer: 59

## 2014-02-15 VITALS — BP 140/85 | HR 88 | Temp 97.1°F | Resp 20

## 2014-02-15 DIAGNOSIS — Z5112 Encounter for antineoplastic immunotherapy: Secondary | ICD-10-CM

## 2014-02-15 DIAGNOSIS — C9000 Multiple myeloma not having achieved remission: Secondary | ICD-10-CM

## 2014-02-15 MED ORDER — SODIUM CHLORIDE 0.9 % IV SOLN
Freq: Once | INTRAVENOUS | Status: AC
  Administered 2014-02-15: 08:00:00 via INTRAVENOUS

## 2014-02-15 MED ORDER — DEXAMETHASONE SODIUM PHOSPHATE 10 MG/ML IJ SOLN
INTRAMUSCULAR | Status: AC
  Filled 2014-02-15: qty 1

## 2014-02-15 MED ORDER — ONDANSETRON 8 MG/NS 50 ML IVPB
INTRAVENOUS | Status: AC
  Filled 2014-02-15: qty 8

## 2014-02-15 MED ORDER — DEXTROSE 5 % IV SOLN
36.0000 mg/m2 | Freq: Once | INTRAVENOUS | Status: AC
  Administered 2014-02-15: 68 mg via INTRAVENOUS
  Filled 2014-02-15: qty 34

## 2014-02-15 MED ORDER — DEXAMETHASONE SODIUM PHOSPHATE 10 MG/ML IJ SOLN
10.0000 mg | Freq: Once | INTRAMUSCULAR | Status: AC
  Administered 2014-02-15: 10 mg via INTRAVENOUS

## 2014-02-15 MED ORDER — ONDANSETRON 8 MG/50ML IVPB (CHCC)
8.0000 mg | Freq: Once | INTRAVENOUS | Status: AC
  Administered 2014-02-15: 8 mg via INTRAVENOUS

## 2014-02-15 NOTE — Patient Instructions (Signed)
[facility] Discharge Instructions for Patients Receiving Chemotherapy  Today you received the following chemotherapy agents: Kyprolis  To help prevent nausea and vomiting after your treatment, we encourage you to take your nausea medication: Compazine 10 mg every 6 hrs as needed.    If you develop nausea and vomiting that is not controlled by your nausea medication, call the clinic.   BELOW ARE SYMPTOMS THAT SHOULD BE REPORTED IMMEDIATELY:  *FEVER GREATER THAN 100.5 F  *CHILLS WITH OR WITHOUT FEVER  NAUSEA AND VOMITING THAT IS NOT CONTROLLED WITH YOUR NAUSEA MEDICATION  *UNUSUAL SHORTNESS OF BREATH  *UNUSUAL BRUISING OR BLEEDING  TENDERNESS IN MOUTH AND THROAT WITH OR WITHOUT PRESENCE OF ULCERS  *URINARY PROBLEMS  *BOWEL PROBLEMS  UNUSUAL RASH Items with * indicate a potential emergency and should be followed up as soon as possible.  Feel free to call the clinic you have any questions or concerns. The clinic phone number is [phone].    

## 2014-02-22 ENCOUNTER — Other Ambulatory Visit (HOSPITAL_BASED_OUTPATIENT_CLINIC_OR_DEPARTMENT_OTHER): Payer: 59

## 2014-02-22 ENCOUNTER — Ambulatory Visit (HOSPITAL_BASED_OUTPATIENT_CLINIC_OR_DEPARTMENT_OTHER): Payer: 59

## 2014-02-22 VITALS — BP 157/82 | HR 68 | Temp 97.9°F | Resp 18

## 2014-02-22 DIAGNOSIS — C9000 Multiple myeloma not having achieved remission: Secondary | ICD-10-CM

## 2014-02-22 DIAGNOSIS — Z5111 Encounter for antineoplastic chemotherapy: Secondary | ICD-10-CM

## 2014-02-22 DIAGNOSIS — Z5112 Encounter for antineoplastic immunotherapy: Secondary | ICD-10-CM

## 2014-02-22 LAB — CBC WITH DIFFERENTIAL/PLATELET
BASO%: 0.4 % (ref 0.0–2.0)
BASOS ABS: 0 10*3/uL (ref 0.0–0.1)
EOS%: 2.3 % (ref 0.0–7.0)
Eosinophils Absolute: 0.1 10*3/uL (ref 0.0–0.5)
HEMATOCRIT: 32.5 % — AB (ref 34.8–46.6)
HEMOGLOBIN: 10.4 g/dL — AB (ref 11.6–15.9)
LYMPH%: 20 % (ref 14.0–49.7)
MCH: 29.2 pg (ref 25.1–34.0)
MCHC: 32 g/dL (ref 31.5–36.0)
MCV: 91.5 fL (ref 79.5–101.0)
MONO#: 0.4 10*3/uL (ref 0.1–0.9)
MONO%: 10.1 % (ref 0.0–14.0)
NEUT#: 2.9 10*3/uL (ref 1.5–6.5)
NEUT%: 67.2 % (ref 38.4–76.8)
PLATELETS: 164 10*3/uL (ref 145–400)
RBC: 3.55 10*6/uL — ABNORMAL LOW (ref 3.70–5.45)
RDW: 16.8 % — ABNORMAL HIGH (ref 11.2–14.5)
WBC: 4.3 10*3/uL (ref 3.9–10.3)
lymph#: 0.9 10*3/uL (ref 0.9–3.3)

## 2014-02-22 LAB — COMPREHENSIVE METABOLIC PANEL (CC13)
ALBUMIN: 3.5 g/dL (ref 3.5–5.0)
ALT: 21 U/L (ref 0–55)
AST: 13 U/L (ref 5–34)
Alkaline Phosphatase: 47 U/L (ref 40–150)
Anion Gap: 12 mEq/L — ABNORMAL HIGH (ref 3–11)
BUN: 13.5 mg/dL (ref 7.0–26.0)
CHLORIDE: 110 meq/L — AB (ref 98–109)
CO2: 21 mEq/L — ABNORMAL LOW (ref 22–29)
Calcium: 9.2 mg/dL (ref 8.4–10.4)
Creatinine: 0.9 mg/dL (ref 0.6–1.1)
Glucose: 128 mg/dl (ref 70–140)
Potassium: 3.4 mEq/L — ABNORMAL LOW (ref 3.5–5.1)
SODIUM: 143 meq/L (ref 136–145)
TOTAL PROTEIN: 5.8 g/dL — AB (ref 6.4–8.3)
Total Bilirubin: 0.47 mg/dL (ref 0.20–1.20)

## 2014-02-22 MED ORDER — SODIUM CHLORIDE 0.9 % IV SOLN
Freq: Once | INTRAVENOUS | Status: AC
  Administered 2014-02-22: 09:00:00 via INTRAVENOUS

## 2014-02-22 MED ORDER — DEXAMETHASONE SODIUM PHOSPHATE 10 MG/ML IJ SOLN
INTRAMUSCULAR | Status: AC
  Filled 2014-02-22: qty 1

## 2014-02-22 MED ORDER — DEXAMETHASONE SODIUM PHOSPHATE 10 MG/ML IJ SOLN
10.0000 mg | Freq: Once | INTRAMUSCULAR | Status: AC
  Administered 2014-02-22: 10 mg via INTRAVENOUS

## 2014-02-22 MED ORDER — ONDANSETRON 8 MG/NS 50 ML IVPB
INTRAVENOUS | Status: AC
  Filled 2014-02-22: qty 8

## 2014-02-22 MED ORDER — SODIUM CHLORIDE 0.9 % IV SOLN
300.0000 mg/m2 | Freq: Once | INTRAVENOUS | Status: AC
  Administered 2014-02-22: 580 mg via INTRAVENOUS
  Filled 2014-02-22: qty 29

## 2014-02-22 MED ORDER — ONDANSETRON 8 MG/50ML IVPB (CHCC)
8.0000 mg | Freq: Once | INTRAVENOUS | Status: AC
  Administered 2014-02-22: 8 mg via INTRAVENOUS

## 2014-02-22 MED ORDER — DEXTROSE 5 % IV SOLN
36.0000 mg/m2 | Freq: Once | INTRAVENOUS | Status: AC
  Administered 2014-02-22: 68 mg via INTRAVENOUS
  Filled 2014-02-22: qty 34

## 2014-02-22 NOTE — Patient Instructions (Signed)
[facility] Discharge Instructions for Patients Receiving Chemotherapy  Today you received the following chemotherapy agents: Cytoxan, Kyprolis.  To help prevent nausea and vomiting after your treatment, we encourage you to take your nausea medication as prescribed.   If you develop nausea and vomiting that is not controlled by your nausea medication, call the clinic.   BELOW ARE SYMPTOMS THAT SHOULD BE REPORTED IMMEDIATELY:  *FEVER GREATER THAN 100.5 F  *CHILLS WITH OR WITHOUT FEVER  NAUSEA AND VOMITING THAT IS NOT CONTROLLED WITH YOUR NAUSEA MEDICATION  *UNUSUAL SHORTNESS OF BREATH  *UNUSUAL BRUISING OR BLEEDING  TENDERNESS IN MOUTH AND THROAT WITH OR WITHOUT PRESENCE OF ULCERS  *URINARY PROBLEMS  *BOWEL PROBLEMS  UNUSUAL RASH Items with * indicate a potential emergency and should be followed up as soon as possible.  Feel free to call the clinic you have any questions or concerns. The clinic phone number is [phone].    

## 2014-02-22 NOTE — Progress Notes (Signed)
Per Dr. Julien Nordmann, okay to use CMET results from 02/14/14 for treatment today. Cindi Carbon, RN

## 2014-02-23 ENCOUNTER — Ambulatory Visit (HOSPITAL_BASED_OUTPATIENT_CLINIC_OR_DEPARTMENT_OTHER): Payer: 59

## 2014-02-23 ENCOUNTER — Encounter: Payer: Self-pay | Admitting: Physician Assistant

## 2014-02-23 ENCOUNTER — Telehealth: Payer: Self-pay | Admitting: Internal Medicine

## 2014-02-23 ENCOUNTER — Ambulatory Visit (HOSPITAL_BASED_OUTPATIENT_CLINIC_OR_DEPARTMENT_OTHER): Payer: 59 | Admitting: Physician Assistant

## 2014-02-23 ENCOUNTER — Encounter: Payer: Self-pay | Admitting: Medical Oncology

## 2014-02-23 VITALS — BP 157/74 | HR 70 | Temp 98.4°F | Resp 20 | Wt 175.8 lb

## 2014-02-23 DIAGNOSIS — Z5112 Encounter for antineoplastic immunotherapy: Secondary | ICD-10-CM

## 2014-02-23 DIAGNOSIS — C9000 Multiple myeloma not having achieved remission: Secondary | ICD-10-CM

## 2014-02-23 MED ORDER — ONDANSETRON 8 MG/50ML IVPB (CHCC)
8.0000 mg | Freq: Once | INTRAVENOUS | Status: AC
  Administered 2014-02-23: 8 mg via INTRAVENOUS

## 2014-02-23 MED ORDER — DEXTROSE 5 % IV SOLN
36.0000 mg/m2 | Freq: Once | INTRAVENOUS | Status: AC
  Administered 2014-02-23: 68 mg via INTRAVENOUS
  Filled 2014-02-23: qty 34

## 2014-02-23 MED ORDER — ONDANSETRON 8 MG/NS 50 ML IVPB
INTRAVENOUS | Status: AC
  Filled 2014-02-23: qty 8

## 2014-02-23 MED ORDER — SODIUM CHLORIDE 0.9 % IV SOLN
Freq: Once | INTRAVENOUS | Status: AC
  Administered 2014-02-23: 09:00:00 via INTRAVENOUS

## 2014-02-23 MED ORDER — DEXAMETHASONE SODIUM PHOSPHATE 10 MG/ML IJ SOLN
INTRAMUSCULAR | Status: AC
  Filled 2014-02-23: qty 1

## 2014-02-23 MED ORDER — DEXAMETHASONE SODIUM PHOSPHATE 10 MG/ML IJ SOLN
10.0000 mg | Freq: Once | INTRAMUSCULAR | Status: AC
  Administered 2014-02-23: 10 mg via INTRAVENOUS

## 2014-02-23 NOTE — Progress Notes (Signed)
Opened encounter to print AVS for 02/22/14 at patient request.

## 2014-02-23 NOTE — Telephone Encounter (Signed)
gv adn printed appt sched and avs for pt for June °

## 2014-02-23 NOTE — Progress Notes (Signed)
[facility] Telephone:(336) [phone]   Fax:(336) [place] NOTE  Victoria Shin, MD 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 First dose on 11/15/2013. Status post 3 cycles and she is here today for day 16 of cycle #4.  INTERVAL HISTORY: Victoria Gates 63 y.o. female returns to the clinic today for follow up visit accompanied by her husband. The patient is feeling fine today with no specific complaints except for mild fatigue. She tolerated the last 3 cycles of her treatment with Carfilzomib, Cytoxan and dexamethasone fairly well. She presents to complete cycle #4. She denied having any significant fever or chills. She denied having any significant chest pain, shortness of breath, cough or hemoptysis. The patient denied having any significant weight loss or night sweats. She has no nausea or vomiting. She denied bone pain. She reports that she was evaluated by Dr. Clydene Laming at [facility] regarding potential bone marrow transplant.   MEDICAL HISTORY: Past Medical History  Diagnosis Date  . COLONIC POLYPS, HX OF 04/24/2007  . OSTEOPOROSIS 04/24/2007  . GERD 09/21/2010  . DM 03/18/2008  . HYPERCHOLESTEROLEMIA 07/03/2009  . ANEMIA-IRON DEFICIENCY 04/24/2007  . ALLERGIC RHINITIS CAUSE UNSPECIFIED 01/02/2009  . ESOPHAGEAL STRICTURE 04/15/2007    s/p dilitation  . HIATAL HERNIA 04/15/2007  . CERVICAL RADICULOPATHY, LEFT 02/01/2010  . 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 Chest pain 03/2012    a. 04/15/2012 Ex MV: Ex time 10 mins, EF 63%, no ischemia/infarct.  Occas PAC's/PVC's.    ALLERGIES:  is allergic to atorvastatin; rosuvastatin; sulfamethoxazole; and sulfonamide derivatives.  MEDICATIONS:  Current Outpatient Prescriptions  Medication Sig Dispense Refill  . acyclovir (ZOVIRAX) 400  MG tablet Take 1 tablet (400 mg total) by mouth 2 (two) times daily.  60 tablet  4  . aspirin 81 MG tablet Take 81 mg by mouth daily.       . Calcium 500-125 MG-UNIT TABS Take 1 tablet by mouth daily.        . cholecalciferol (VITAMIN D) 1000 UNITS tablet Take 1,000 Units by mouth daily.      . colestipol (COLESTID) 1 G tablet Take 1 g by mouth 2 (two) times daily.      Marland Kitchen dexamethasone (DECADRON) 4 MG tablet 10 tablets by mouth every week starting with the first dose of chemotherapy  40 tablet  5  . ezetimibe (ZETIA) 10 MG tablet Take 10 mg by mouth every evening.      Marland Kitchen FeFum-FePoly-FA-B Cmp-C-Biot (INTEGRA PLUS) CAPS Take 1 capsule by mouth every morning.  30 capsule  3  . fish oil-omega-3 fatty acids 1000 MG capsule Take 1 capsule by mouth daily.       . lansoprazole (PREVACID) 30 MG capsule Take 30 mg by mouth daily at 12 noon.      . metFORMIN (GLUMETZA) 500 MG (MOD) 24 hr tablet Take 500 mg by mouth every evening.      Marland Kitchen OMEGA-3 FATTY ACIDS-VITAMIN E PO Take 1,000 mcg by mouth daily.      . prochlorperazine (COMPAZINE) 10 MG tablet TAKE 1 TABLET (10 MG TOTAL) BY MOUTH EVERY 6 (SIX) HOURS AS NEEDED FOR NAUSEA OR VOMITING.  60 tablet  0   No current facility-administered medications for this visit.    SURGICAL HISTORY:  Past Surgical History  Procedure  Laterality Date  . Tubal ligation    . Esophagogastroduodenoscopy  04/15/2007    REVIEW OF SYSTEMS:  Constitutional: negative Eyes: negative Ears, nose, mouth, throat, and face: negative Respiratory: negative Cardiovascular: negative Gastrointestinal: negative Genitourinary:negative Integument/breast: negative Hematologic/lymphatic: negative Musculoskeletal:negative Neurological: negative Behavioral/Psych: negative Endocrine: negative Allergic/Immunologic: negative   PHYSICAL EXAMINATION: General appearance: alert, cooperative, fatigued and no distress Head: Normocephalic, without obvious abnormality, atraumatic Neck: no  adenopathy, no JVD, supple, symmetrical, trachea midline and thyroid not enlarged, symmetric, no tenderness/mass/nodules Lymph nodes: Cervical, supraclavicular, and axillary nodes normal. Resp: clear to auscultation bilaterally Back: symmetric, no curvature. ROM normal. No CVA tenderness. Cardio: regular rate and rhythm, S1, S2 normal, no murmur, click, rub or gallop GI: soft, non-tender; bowel sounds normal; no masses,  no organomegaly Extremities: extremities normal, atraumatic, no cyanosis or edema Neurologic: Alert and oriented X 3, normal strength and tone. Normal symmetric reflexes. Normal coordination and gait  ECOG PERFORMANCE STATUS: 1 - Symptomatic but completely ambulatory  There were no vitals taken for this visit.  LABORATORY DATA: Lab Results  Component Value Date   WBC 4.3 02/22/2014   HGB 10.4* 02/22/2014   HCT 32.5* 02/22/2014   MCV 91.5 02/22/2014   PLT 164 02/22/2014      Chemistry      Component Value Date/Time   NA 143 02/22/2014 0749   NA 140 04/14/2013 0916   K 3.4* 02/22/2014 0749   K 3.8 04/14/2013 0916   CL 105 04/14/2013 0916   CO2 21* 02/22/2014 0749   CO2 28 04/14/2013 0916   BUN 13.5 02/22/2014 0749   BUN 10 04/14/2013 0916   CREATININE 0.9 02/22/2014 0749   CREATININE 1.0 04/14/2013 0916      Component Value Date/Time   CALCIUM 9.2 02/22/2014 0749   CALCIUM 9.7 04/14/2013 0916   CALCIUM 9.0 12/18/2009 2256   ALKPHOS 47 02/22/2014 0749   ALKPHOS 42 04/14/2013 0916   AST 13 02/22/2014 0749   AST 23 04/14/2013 0916   ALT 21 02/22/2014 0749   ALT 24 04/14/2013 0916   BILITOT 0.47 02/22/2014 0749   BILITOT 0.4 04/14/2013 0916       ASSESSMENT AND PLAN: this is a very pleasant 63 years old African American female who was recently diagnosed with IgA multiple myeloma with significant elevation of IgA over 7000.  She is currently undergoing systemic chemotherapy with Carfilzomib, Cytoxan and dexamethasone and tolerating it fairly well.  she status post 3 cycles. The  patient was discussed with also seen by Dr. Julien Nordmann. She'll proceed with the completion of cycle #4 today as scheduled without dose modifications. Patient was discussed with and also seen by Dr. Julien Nordmann. She will complete cycle # 4 as scheduled. We will order repeat protein studies and restaging bone marrow biopsy to be done in one week. The bone marrow biopsy will be done by Interventional radiology. She will follow up in 2 weeks with Dr. Julien Nordmann to discuss the results and to see if she will require more chemotherapy prior to proceeding with bone marrow transplant.  Monthly Zometa infusions for her bone disease was again discussed with the patient and her husband. Patient is due for a dental cleaning within the next week. She will obtain written dental clearance from her dentist prior o the initiation of Zometa therapy.  The patient voices understanding of current disease status and treatment options and is in agreement with the current care plan.  All questions were answered. The patient knows to call the clinic  with any problems, questions or concerns. We can certainly see the patient much sooner if necessary.  Carlton Adam, PA-C  ADDENDUM: Hematology/Oncology Attending: I had a face to face encounter with the patient today. I recommended her care plan. This is a very pleasant 63 years old African American female diagnosed with multiple myeloma IgA subtype and currently undergoing systemic chemotherapy with Carfilzomib, Cytoxan and dexamethasone status post 3 cycles and she is currently undergoing cycle #4. The patient is to rating her treatment fairly well with no significant adverse effects. I recommended for her to complete cycle #4 as scheduled. The patient would have repeat bone marrow biopsy and aspirate in addition to myeloma panel for evaluation of her disease. If she has significant improvement in her disease, she will be referred to see Dr. Clydene Laming at [facility] for consideration of  autologous peripheral blood stem cell transplant, otherwise she may continue with 2 more cycles of the same regimen. The patient and her husband agreed to the current plan. She would come back for followup visit in 2 weeks. She was advised to call immediately if she has any concerning symptoms in the interval.  Disclaimer: This note was dictated with voice recognition software. Similar sounding words can inadvertently be transcribed and may not be corrected upon review. Curt Bears, MD 02/23/2014

## 2014-02-23 NOTE — Patient Instructions (Addendum)
You will be scheduled for a restaging bone marrow biopsy and repeat protein studies next week Obtain dental clearance from your dentist to begin Zometa therapy Follow up with Dr. Julien Nordmann in 2 weeksZoledronic Acid injection (Hypercalcemia, Oncology) What is this medicine? ZOLEDRONIC ACID (ZOE le dron ik AS id) lowers the amount of calcium loss from bone. It is used to treat too much calcium in your blood from cancer. It is also used to prevent complications of cancer that has spread to the bone. This medicine may be used for other purposes; ask your health care provider or pharmacist if you have questions. COMMON BRAND NAME(S): Zometa What should I tell my health care provider before I take this medicine? They need to know if you have any of these conditions: -aspirin-sensitive asthma -cancer, especially if you are receiving medicines used to treat cancer -dental disease or wear dentures -infection -kidney disease -receiving corticosteroids like dexamethasone or prednisone -an unusual or allergic reaction to zoledronic acid, other medicines, foods, dyes, or preservatives -pregnant or trying to get pregnant -breast-feeding How should I use this medicine? This medicine is for infusion into a vein. It is given by a health care professional in a hospital or clinic setting. Talk to your pediatrician regarding the use of this medicine in children. Special care may be needed. Overdosage: If you think you have taken too much of this medicine contact a poison control center or emergency room at once. NOTE: This medicine is only for you. Do not share this medicine with others. What if I miss a dose? It is important not to miss your dose. Call your doctor or health care professional if you are unable to keep an appointment. What may interact with this medicine? -certain antibiotics given by injection -NSAIDs, medicines for pain and inflammation, like ibuprofen or naproxen -some diuretics like  bumetanide, furosemide -teriparatide -thalidomide This list may not describe all possible interactions. Give your health care provider a list of all the medicines, herbs, non-prescription drugs, or dietary supplements you use. Also tell them if you smoke, drink alcohol, or use illegal drugs. Some items may interact with your medicine. What should I watch for while using this medicine? Visit your doctor or health care professional for regular checkups. It may be some time before you see the benefit from this medicine. Do not stop taking your medicine unless your doctor tells you to. Your doctor may order blood tests or other tests to see how you are doing. Women should inform their doctor if they wish to become pregnant or think they might be pregnant. There is a potential for serious side effects to an unborn child. Talk to your health care professional or pharmacist for more information. You should make sure that you get enough calcium and vitamin D while you are taking this medicine. Discuss the foods you eat and the vitamins you take with your health care professional. Some people who take this medicine have severe bone, joint, and/or muscle pain. This medicine may also increase your risk for jaw problems or a broken thigh bone. Tell your doctor right away if you have severe pain in your jaw, bones, joints, or muscles. Tell your doctor if you have any pain that does not go away or that gets worse. Tell your dentist and dental surgeon that you are taking this medicine. You should not have major dental surgery while on this medicine. See your dentist to have a dental exam and fix any dental problems before starting this medicine.  Take good care of your teeth while on this medicine. Make sure you see your dentist for regular follow-up appointments. What side effects may I notice from receiving this medicine? Side effects that you should report to your doctor or health care professional as soon as  possible: -allergic reactions like skin rash, itching or hives, swelling of the face, lips, or tongue -anxiety, confusion, or depression -breathing problems -changes in vision -eye pain -feeling faint or lightheaded, falls -jaw pain, especially after dental work -mouth sores -muscle cramps, stiffness, or weakness -trouble passing urine or change in the amount of urine Side effects that usually do not require medical attention (report to your doctor or health care professional if they continue or are bothersome): -bone, joint, or muscle pain -constipation -diarrhea -fever -hair loss -irritation at site where injected -loss of appetite -nausea, vomiting -stomach upset -trouble sleeping -trouble swallowing -weak or tired This list may not describe all possible side effects. Call your doctor for medical advice about side effects. You may report side effects to FDA at 1-800-FDA-1088. Where should I keep my medicine? This drug is given in a hospital or clinic and will not be stored at home. NOTE: This sheet is a summary. It may not cover all possible information. If you have questions about this medicine, talk to your doctor, pharmacist, or health care provider.  2014, [employer]. (2013-02-25 13:03:13)

## 2014-02-23 NOTE — Patient Instructions (Signed)
[facility] Discharge Instructions for Patients Receiving Chemotherapy  Today you received the following chemotherapy agent :Carfilzomib (Kyprolis)  To help prevent nausea and vomiting after your treatment, we encourage you to take your nausea medications as directed:  Compazine 10 mg every 6 hours as needed If you develop nausea and vomiting that is not controlled by your nausea medication, call the clinic.   BELOW ARE SYMPTOMS THAT SHOULD BE REPORTED IMMEDIATELY:  *FEVER GREATER THAN 100.5 F  *CHILLS WITH OR WITHOUT FEVER  NAUSEA AND VOMITING THAT IS NOT CONTROLLED WITH YOUR NAUSEA MEDICATION  *UNUSUAL SHORTNESS OF BREATH  *UNUSUAL BRUISING OR BLEEDING  TENDERNESS IN MOUTH AND THROAT WITH OR WITHOUT PRESENCE OF ULCERS  *URINARY PROBLEMS  *BOWEL PROBLEMS  UNUSUAL RASH Items with * indicate a potential emergency and should be followed up as soon as possible.  Feel free to call the clinic should you have any questions or concerns. The clinic phone number is [phone].  It has been a pleasure to serve you today!

## 2014-02-28 ENCOUNTER — Other Ambulatory Visit: Payer: Self-pay | Admitting: Physician Assistant

## 2014-02-28 ENCOUNTER — Telehealth: Payer: Self-pay | Admitting: Internal Medicine

## 2014-02-28 ENCOUNTER — Telehealth: Payer: Self-pay | Admitting: *Deleted

## 2014-02-28 ENCOUNTER — Other Ambulatory Visit (HOSPITAL_BASED_OUTPATIENT_CLINIC_OR_DEPARTMENT_OTHER): Payer: 59

## 2014-02-28 DIAGNOSIS — C9000 Multiple myeloma not having achieved remission: Secondary | ICD-10-CM

## 2014-02-28 HISTORY — DX: Multiple myeloma not having achieved remission: C90.00

## 2014-02-28 LAB — COMPREHENSIVE METABOLIC PANEL (CC13)
ALK PHOS: 47 U/L (ref 40–150)
ALT: 12 U/L (ref 0–55)
AST: 12 U/L (ref 5–34)
Albumin: 3.5 g/dL (ref 3.5–5.0)
Anion Gap: 13 mEq/L — ABNORMAL HIGH (ref 3–11)
BILIRUBIN TOTAL: 0.52 mg/dL (ref 0.20–1.20)
BUN: 12.4 mg/dL (ref 7.0–26.0)
CO2: 20 meq/L — AB (ref 22–29)
CREATININE: 0.9 mg/dL (ref 0.6–1.1)
Calcium: 8.9 mg/dL (ref 8.4–10.4)
Chloride: 109 mEq/L (ref 98–109)
Glucose: 88 mg/dl (ref 70–140)
Potassium: 3.6 mEq/L (ref 3.5–5.1)
SODIUM: 142 meq/L (ref 136–145)
TOTAL PROTEIN: 5.7 g/dL — AB (ref 6.4–8.3)

## 2014-02-28 LAB — CBC WITH DIFFERENTIAL/PLATELET
BASO%: 0.6 % (ref 0.0–2.0)
Basophils Absolute: 0 10*3/uL (ref 0.0–0.1)
EOS%: 1.4 % (ref 0.0–7.0)
Eosinophils Absolute: 0.1 10*3/uL (ref 0.0–0.5)
HEMATOCRIT: 32.3 % — AB (ref 34.8–46.6)
HGB: 10.4 g/dL — ABNORMAL LOW (ref 11.6–15.9)
LYMPH%: 16.4 % (ref 14.0–49.7)
MCH: 29.3 pg (ref 25.1–34.0)
MCHC: 32.3 g/dL (ref 31.5–36.0)
MCV: 90.7 fL (ref 79.5–101.0)
MONO#: 0.4 10*3/uL (ref 0.1–0.9)
MONO%: 8.6 % (ref 0.0–14.0)
NEUT#: 3.6 10*3/uL (ref 1.5–6.5)
NEUT%: 73 % (ref 38.4–76.8)
PLATELETS: 161 10*3/uL (ref 145–400)
RBC: 3.56 10*6/uL — AB (ref 3.70–5.45)
RDW: 16.3 % — ABNORMAL HIGH (ref 11.2–14.5)
WBC: 5 10*3/uL (ref 3.9–10.3)
lymph#: 0.8 10*3/uL — ABNORMAL LOW (ref 0.9–3.3)

## 2014-02-28 LAB — LACTATE DEHYDROGENASE (CC13): LDH: 173 U/L (ref 125–245)

## 2014-02-28 NOTE — Telephone Encounter (Signed)
Sent MSG to [place] and Dr. Tyrell Antonio to add bx under orders..biopsy put in incorrectly.

## 2014-02-28 NOTE — Telephone Encounter (Signed)
Received dental clearance letter for patient from her dentist Dr Arita Miss, DDS dated 02/24/14.  Copy of letter given to Dr Vista Mink to review, letter will be scanned into EPIC.  SLJ

## 2014-03-01 ENCOUNTER — Other Ambulatory Visit: Payer: Self-pay | Admitting: Radiology

## 2014-03-01 ENCOUNTER — Other Ambulatory Visit: Payer: 59

## 2014-03-02 ENCOUNTER — Ambulatory Visit (HOSPITAL_COMMUNITY)
Admission: RE | Admit: 2014-03-02 | Discharge: 2014-03-02 | Disposition: A | Discharge status: Home / Self Care | Payer: 59 | Source: Ambulatory Visit | Attending: Medical | Admitting: Medical

## 2014-03-02 ENCOUNTER — Encounter (HOSPITAL_COMMUNITY): Payer: Self-pay

## 2014-03-02 ENCOUNTER — Ambulatory Visit (HOSPITAL_COMMUNITY)
Admission: RE | Admit: 2014-03-02 | Discharge: 2014-03-02 | Disposition: A | Discharge status: Home / Self Care | Payer: 59 | Source: Ambulatory Visit | Attending: Physician Assistant | Admitting: Physician Assistant

## 2014-03-02 DIAGNOSIS — E119 Type 2 diabetes mellitus without complications: Secondary | ICD-10-CM | POA: Insufficient documentation

## 2014-03-02 DIAGNOSIS — M810 Age-related osteoporosis without current pathological fracture: Secondary | ICD-10-CM | POA: Insufficient documentation

## 2014-03-02 DIAGNOSIS — C9000 Multiple myeloma not having achieved remission: Secondary | ICD-10-CM | POA: Insufficient documentation

## 2014-03-02 DIAGNOSIS — K219 Gastro-esophageal reflux disease without esophagitis: Secondary | ICD-10-CM | POA: Insufficient documentation

## 2014-03-02 DIAGNOSIS — Z860100 Personal history of colon polyps, unspecified: Secondary | ICD-10-CM | POA: Insufficient documentation

## 2014-03-02 DIAGNOSIS — D649 Anemia, unspecified: Secondary | ICD-10-CM | POA: Insufficient documentation

## 2014-03-02 DIAGNOSIS — Z79899 Other long term (current) drug therapy: Secondary | ICD-10-CM | POA: Insufficient documentation

## 2014-03-02 DIAGNOSIS — Z7982 Long term (current) use of aspirin: Secondary | ICD-10-CM | POA: Insufficient documentation

## 2014-03-02 DIAGNOSIS — D704 Cyclic neutropenia: Secondary | ICD-10-CM | POA: Insufficient documentation

## 2014-03-02 DIAGNOSIS — E7800 Pure hypercholesterolemia, unspecified: Secondary | ICD-10-CM | POA: Insufficient documentation

## 2014-03-02 LAB — PROTIME-INR
INR: 1.07 (ref 0.00–1.49)
Prothrombin Time: 13.7 seconds (ref 11.6–15.2)

## 2014-03-02 LAB — CBC
HCT: 32.4 % — ABNORMAL LOW (ref 36.0–46.0)
Hemoglobin: 10.4 g/dL — ABNORMAL LOW (ref 12.0–15.0)
MCH: 28.9 pg (ref 26.0–34.0)
MCHC: 32.1 g/dL (ref 30.0–36.0)
MCV: 90 fL (ref 78.0–100.0)
Platelets: 176 10*3/uL (ref 150–400)
RBC: 3.6 MIL/uL — ABNORMAL LOW (ref 3.87–5.11)
RDW: 15.6 % — ABNORMAL HIGH (ref 11.5–15.5)
WBC: 4.2 10*3/uL (ref 4.0–10.5)

## 2014-03-02 LAB — GLUCOSE, CAPILLARY: Glucose-Capillary: 90 mg/dL (ref 70–99)

## 2014-03-02 LAB — BONE MARROW EXAM

## 2014-03-02 LAB — APTT: aPTT: 29 seconds (ref 24–37)

## 2014-03-02 MED ORDER — SODIUM CHLORIDE 0.9 % IV SOLN
INTRAVENOUS | Status: DC
  Administered 2014-03-02: 10 mL/h via INTRAVENOUS

## 2014-03-02 MED ORDER — FENTANYL CITRATE 0.05 MG/ML IJ SOLN
INTRAMUSCULAR | Status: AC
  Filled 2014-03-02: qty 6

## 2014-03-02 MED ORDER — MIDAZOLAM HCL 2 MG/2ML IJ SOLN
INTRAMUSCULAR | Status: DC | PRN
  Administered 2014-03-02 (×3): 1 mg via INTRAVENOUS

## 2014-03-02 MED ORDER — HYDROCODONE-ACETAMINOPHEN 5-325 MG PO TABS
1.0000 | ORAL_TABLET | ORAL | Status: DC | PRN
  Filled 2014-03-02: qty 2

## 2014-03-02 MED ORDER — MIDAZOLAM HCL 2 MG/2ML IJ SOLN
INTRAMUSCULAR | Status: AC
  Filled 2014-03-02: qty 6

## 2014-03-02 MED ORDER — FENTANYL CITRATE 0.05 MG/ML IJ SOLN
INTRAMUSCULAR | Status: DC | PRN
  Administered 2014-03-02 (×2): 50 ug via INTRAVENOUS

## 2014-03-02 NOTE — H&P (Signed)
Chief Complaint: "I'm here for another bone marrow biopsy" Referring Physician:Mohamed HPI: Victoria Gates is an 63 y.o. female with multiple myeloma. She had BM biopsy back in January and did well from that. She has undergone treatment and is now scheduled for repeat staging bone marrow biopsy. PMHx and meds reviewed.  Past Medical History:  Past Medical History  Diagnosis Date  . COLONIC POLYPS, HX OF 04/24/2007  . OSTEOPOROSIS 04/24/2007  . GERD 09/21/2010  . DM 03/18/2008  . HYPERCHOLESTEROLEMIA 07/03/2009  . ANEMIA-IRON DEFICIENCY 04/24/2007  . ALLERGIC RHINITIS CAUSE UNSPECIFIED 01/02/2009  . ESOPHAGEAL STRICTURE 04/15/2007    s/p dilitation  . HIATAL HERNIA 04/15/2007  . CERVICAL RADICULOPATHY, LEFT 02/01/2010  . 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 Chest pain 03/2012    a. 04/15/2012 Ex MV: Ex time 10 mins, EF 63%, no ischemia/infarct.  Occas PAC's/PVC's.    Past Surgical History:  Past Surgical History  Procedure Laterality Date  . Tubal ligation    . Esophagogastroduodenoscopy  04/15/2007    Family History:  Family History  Problem Relation Age of Onset  . Cancer Neg Hx     No FH of Colon Cancer  . Colon cancer Neg Hx   . Esophageal cancer Neg Hx   . Rectal cancer Neg Hx   . Stomach cancer Neg Hx   . Brain cancer Mother   . Heart disease Mother   . Lung cancer Father   . Heart disease Father   . Diabetes Sister   . Heart disease Sister   . Diabetes Brother   . Heart disease Brother     Social History:  reports that she has never smoked. She has never used smokeless tobacco. She reports that she does not drink alcohol or use illicit drugs.  Allergies:  Allergies  Allergen Reactions  . Atorvastatin     REACTION: myalgias  . Rosuvastatin     REACTION: myalgias  . Sulfamethoxazole     REACTION: unspecified  . Sulfonamide Derivatives     Medications:   Medication List    ASK your doctor about these medications       acyclovir 400 MG tablet  Commonly known as:   ZOVIRAX  Take 1 tablet (400 mg total) by mouth 2 (two) times daily.     aspirin 81 MG tablet  Take 81 mg by mouth daily.     Calcium 500-125 MG-UNIT Tabs  Take 1 tablet by mouth daily.     cholecalciferol 1000 UNITS tablet  Commonly known as:  VITAMIN D  Take 1,000 Units by mouth daily.     colestipol 1 G tablet  Commonly known as:  COLESTID  Take 1 g by mouth 2 (two) times daily.     dexamethasone 4 MG tablet  Commonly known as:  DECADRON  10 tablets by mouth every week starting with the first dose of chemotherapy     ezetimibe 10 MG tablet  Commonly known as:  ZETIA  Take 10 mg by mouth every evening.     fish oil-omega-3 fatty acids 1000 MG capsule  Take 1 capsule by mouth daily.     INTEGRA PLUS Caps  Take 1 capsule by mouth every morning.     lansoprazole 30 MG capsule  Commonly known as:  PREVACID  Take 30 mg by mouth daily at 12 noon.     metFORMIN 500 MG (MOD) 24 hr tablet  Commonly known as:  GLUMETZA  Take 500 mg by mouth every evening.  OMEGA-3 FATTY ACIDS-VITAMIN E PO  Take 1,000 mcg by mouth daily.     prochlorperazine 10 MG tablet  Commonly known as:  COMPAZINE  TAKE 1 TABLET (10 MG TOTAL) BY MOUTH EVERY 6 (SIX) HOURS AS NEEDED FOR NAUSEA OR VOMITING.        Please HPI for pertinent positives, otherwise complete 10 system ROS negative.  Physical Exam: BP 156/85  Pulse 76  Temp(Src) 98.2 F (36.8 C) (Oral)  Resp 20  Ht _0  (1.676 m)  Wt 175 lb (79.379 kg)  BMI 28.26 kg/m2  SpO2 100% Body mass index is 28.26 kg/(m^2).   General Appearance:  Alert, cooperative, no distress, appears stated age  Head:  Normocephalic, without obvious abnormality, atraumatic  ENT: Unremarkable  Neck: Supple, symmetrical, trachea midline  Lungs:   Clear to auscultation bilaterally, no w/r/r, respirations unlabored without use of accessory muscles.  Chest Wall:  No tenderness or deformity  Heart:  Regular rate and rhythm, S1, S2 normal, no murmur, rub or  gallop.  Neurologic: Normal affect, no gross deficits.   Results for orders placed during the hospital encounter of 03/02/14 (from the past 48 hour(s))  APTT     Status: None   Collection Time    03/02/14  7:25 AM      Result Value Ref Range   aPTT 29  24 - 37 seconds  CBC     Status: Abnormal   Collection Time    03/02/14  7:25 AM      Result Value Ref Range   WBC 4.2  4.0 - 10.5 K/uL   RBC 3.60 (*) 3.87 - 5.11 MIL/uL   Hemoglobin 10.4 (*) 12.0 - 15.0 g/dL   HCT 32.4 (*) 36.0 - 46.0 %   MCV 90.0  78.0 - 100.0 fL   MCH 28.9  26.0 - 34.0 pg   MCHC 32.1  30.0 - 36.0 g/dL   RDW 15.6 (*) 11.5 - 15.5 %   Platelets 176  150 - 400 K/uL  PROTIME-INR     Status: None   Collection Time    03/02/14  7:25 AM      Result Value Ref Range   Prothrombin Time 13.7  11.6 - 15.2 seconds   INR 1.07  0.00 - 1.49   No results found.  Assessment/Plan Multiple myeloma For CT guided bone marrow biopsy Reviewed procedure, risks, complications, use of sedation Labs reviewed, ok Consent signed in chart  Ascencion Dike PA-C 03/02/2014, 8:07 AM

## 2014-03-02 NOTE — Procedures (Signed)
CT guided bone marrow biopsy.  4 aspirates and 2 cores.  No immediate complication.  

## 2014-03-02 NOTE — Discharge Instructions (Signed)
Bone Marrow Aspiration, Bone Marrow Biopsy °Care After °Read the instructions outlined below and refer to this sheet in the next few weeks. These discharge instructions provide you with general information on caring for yourself after you leave the hospital. Your caregiver may also give you specific instructions. While your treatment has been planned according to the most current medical practices available, unavoidable complications occasionally occur. If you have any problems or questions after discharge, call your caregiver. °FINDING OUT THE RESULTS OF YOUR TEST °Not all test results are available during your visit. If your test results are not back during the visit, make an appointment with your caregiver to find out the results. Do not assume everything is normal if you have not heard from your caregiver or the medical facility. It is important for you to follow up on all of your test results.  °HOME CARE INSTRUCTIONS  °You have had sedation and may be sleepy or dizzy. Your thinking may not be as clear as usual. For the next 24 hours: °· Only take over-the-counter or prescription medicines for pain, discomfort, and or fever as directed by your caregiver. °· Do not drink alcohol. °· Do not smoke. °· Do not drive. °· Do not make important legal decisions. °· Do not operate heavy machinery. °· Do not care for small children by yourself. °· Keep your dressing clean and dry. You may replace dressing with a bandage after 24 hours. °· You may take a bath or shower after 24 hours. °· Use an ice pack for 20 minutes every 2 hours while awake for pain as needed. °SEEK MEDICAL CARE IF:  °· There is redness, swelling, or increasing pain at the biopsy site. °· There is pus coming from the biopsy site. °· There is drainage from a biopsy site lasting longer than one day. °· An unexplained oral temperature above 102° F (38.9° C) develops. °SEEK IMMEDIATE MEDICAL CARE IF:  °· You develop a rash. °· You have difficulty  breathing. °· You develop any reaction or side effects to medications given. °Document Released: 04/05/2005 Document Revised: 12/09/2011 Document Reviewed: 09/13/2008 °[employer]® Patient Information ©2014 [employer]. °Moderate Sedation, Adult °Moderate sedation is given to help you relax or even sleep through a procedure. You may remain sleepy, be clumsy, or have poor balance for several hours following this procedure. Arrange for a responsible adult, family member, or friend to take you home. A responsible adult should stay with you for at least 24 hours or until the medicines have worn off. °· Do not participate in any activities where you could become injured for the next 24 hours, or until you feel normal again. Do not: °· Drive. °· Swim. °· Ride a bicycle. °· Operate heavy machinery. °· Cook. °· Use power tools. °· Climb ladders. °· Work at heights. °· Do not make important decisions or sign legal documents until you are improved. °· Vomiting may occur if you eat too soon. When you can drink without vomiting, try water, juice, or soup. Try solid foods if you feel little or no nausea. °· Only take over-the-counter or prescription medications for pain, discomfort, or fever as directed by your caregiver.If pain medications have been prescribed for you, ask your caregiver how soon it is safe to take them. °· Make sure you and your family fully understands everything about the medication given to you. Make sure you understand what side effects may occur. °· You should not drink alcohol, take sleeping pills, or medications that cause drowsiness   for at least 24 hours.  If you smoke, do not smoke alone.  If you are feeling better, you may resume normal activities 24 hours after receiving sedation.  Keep all appointments as scheduled. Follow all instructions.  Ask questions if you do not understand. SEEK MEDICAL CARE IF:   Your skin is pale or bluish in color.  You continue to feel sick to your stomach  (nauseous) or throw up (vomit).  Your pain is getting worse and not helped by medication.  You have bleeding or swelling.  You are still sleepy or feeling clumsy after 24 hours. SEEK IMMEDIATE MEDICAL CARE IF:   You develop a rash.  You have difficulty breathing.  You develop any type of allergic problem.  You have a fever. Document Released: 06/11/2001 Document Revised: 12/09/2011 Document Reviewed: 05/24/2013 [facility] Patient Information 2014 [place].

## 2014-03-03 ENCOUNTER — Other Ambulatory Visit: Payer: Self-pay | Admitting: Internal Medicine

## 2014-03-03 LAB — IGG, IGA, IGM
IGA: 72 mg/dL (ref 69–380)
IgG (Immunoglobin G), Serum: 438 mg/dL — ABNORMAL LOW (ref 690–1700)
IgM, Serum: 10 mg/dL — ABNORMAL LOW (ref 52–322)

## 2014-03-03 LAB — KAPPA/LAMBDA LIGHT CHAINS: LAMBDA FREE LGHT CHN: 0.12 mg/dL — AB (ref 0.57–2.63)

## 2014-03-03 LAB — BETA 2 MICROGLOBULIN, SERUM: Beta-2 Microglobulin: 2.48 mg/L (ref <=2.51)

## 2014-03-04 ENCOUNTER — Other Ambulatory Visit: Payer: Self-pay | Admitting: Medical Oncology

## 2014-03-04 DIAGNOSIS — C9000 Multiple myeloma not having achieved remission: Secondary | ICD-10-CM

## 2014-03-07 ENCOUNTER — Other Ambulatory Visit (HOSPITAL_BASED_OUTPATIENT_CLINIC_OR_DEPARTMENT_OTHER): Payer: 59

## 2014-03-07 ENCOUNTER — Ambulatory Visit (HOSPITAL_BASED_OUTPATIENT_CLINIC_OR_DEPARTMENT_OTHER): Payer: 59 | Admitting: Internal Medicine

## 2014-03-07 ENCOUNTER — Ambulatory Visit: Payer: 59

## 2014-03-07 ENCOUNTER — Encounter: Payer: Self-pay | Admitting: Internal Medicine

## 2014-03-07 ENCOUNTER — Telehealth: Payer: Self-pay | Admitting: Internal Medicine

## 2014-03-07 VITALS — BP 141/72 | HR 58 | Temp 98.2°F | Resp 18 | Ht 66.0 in | Wt 174.5 lb

## 2014-03-07 DIAGNOSIS — R5383 Other fatigue: Secondary | ICD-10-CM

## 2014-03-07 DIAGNOSIS — C9000 Multiple myeloma not having achieved remission: Secondary | ICD-10-CM

## 2014-03-07 DIAGNOSIS — R5381 Other malaise: Secondary | ICD-10-CM

## 2014-03-07 LAB — CBC WITH DIFFERENTIAL/PLATELET
BASO%: 0.7 % (ref 0.0–2.0)
Basophils Absolute: 0 10*3/uL (ref 0.0–0.1)
EOS%: 2.9 % (ref 0.0–7.0)
Eosinophils Absolute: 0.1 10*3/uL (ref 0.0–0.5)
HCT: 35.3 % (ref 34.8–46.6)
HGB: 11.4 g/dL — ABNORMAL LOW (ref 11.6–15.9)
LYMPH%: 26.9 % (ref 14.0–49.7)
MCH: 29.4 pg (ref 25.1–34.0)
MCHC: 32.2 g/dL (ref 31.5–36.0)
MCV: 91.5 fL (ref 79.5–101.0)
MONO#: 0.4 10*3/uL (ref 0.1–0.9)
MONO%: 10.1 % (ref 0.0–14.0)
NEUT#: 2.1 10*3/uL (ref 1.5–6.5)
NEUT%: 59.4 % (ref 38.4–76.8)
PLATELETS: 224 10*3/uL (ref 145–400)
RBC: 3.86 10*6/uL (ref 3.70–5.45)
RDW: 16.3 % — ABNORMAL HIGH (ref 11.2–14.5)
WBC: 3.5 10*3/uL — AB (ref 3.9–10.3)
lymph#: 0.9 10*3/uL (ref 0.9–3.3)

## 2014-03-07 LAB — COMPREHENSIVE METABOLIC PANEL (CC13)
ALT: 13 U/L (ref 0–55)
ANION GAP: 15 meq/L — AB (ref 3–11)
AST: 20 U/L (ref 5–34)
Albumin: 3.9 g/dL (ref 3.5–5.0)
Alkaline Phosphatase: 49 U/L (ref 40–150)
BILIRUBIN TOTAL: 0.66 mg/dL (ref 0.20–1.20)
BUN: 11.9 mg/dL (ref 7.0–26.0)
CO2: 18 meq/L — AB (ref 22–29)
Calcium: 9.3 mg/dL (ref 8.4–10.4)
Chloride: 110 mEq/L — ABNORMAL HIGH (ref 98–109)
Creatinine: 0.9 mg/dL (ref 0.6–1.1)
Glucose: 101 mg/dl (ref 70–140)
Potassium: 3.5 mEq/L (ref 3.5–5.1)
SODIUM: 143 meq/L (ref 136–145)
Total Protein: 6.2 g/dL — ABNORMAL LOW (ref 6.4–8.3)

## 2014-03-07 NOTE — Progress Notes (Signed)
Reviewed labs and bone marrow biopsy result with MD. Does not need treatment today. Needs to be seen by MD to plan next steps. Patient notified and returned to lobby to await MD visit.

## 2014-03-07 NOTE — Telephone Encounter (Signed)
printed pt avs..all appts cx per 6.8 pof

## 2014-03-07 NOTE — Progress Notes (Signed)
[place] [phone]   [phone]  OFFICE PROGRESS NOTE  [name], MD 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 First dose on 11/15/2013. Status post 2 cycles and she is here today for day 15 of cycle #4.  INTERVAL HISTORY: Victoria Gates 63 y.o. female returns to the clinic today for follow up visit accompanied by her husband. The patient is feeling fine today with no specific complaints except for mild fatigue. She tolerated the last 4 cycles of her treatment with Carfilzomib, Cytoxan and dexamethasone fairly well. She denied having any significant fever or chills. She denied having any significant chest pain, shortness of breath, cough or hemoptysis. The patient denied having any significant weight loss or night sweats. She has no nausea or vomiting. She had repeat myeloma panel in addition to repeat bone marrow biopsy and aspirate performed recently and she is here for evaluation and discussion of her lab and biopsy results.  MEDICAL HISTORY: Past Medical History  Diagnosis Date  . COLONIC POLYPS, HX OF 04/24/2007  . OSTEOPOROSIS 04/24/2007  . GERD 09/21/2010  . DM 03/18/2008  . HYPERCHOLESTEROLEMIA 07/03/2009  . ANEMIA-IRON DEFICIENCY 04/24/2007  . ALLERGIC RHINITIS CAUSE UNSPECIFIED 01/02/2009  . ESOPHAGEAL STRICTURE 04/15/2007    s/p dilitation  . HIATAL HERNIA 04/15/2007  . CERVICAL RADICULOPATHY, LEFT 02/01/2010  . 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 Chest pain 03/2012    a. 04/15/2012 Ex MV: Ex time 10 mins, EF 63%, no ischemia/infarct.  Occas PAC's/PVC's.    ALLERGIES:  is allergic to atorvastatin; rosuvastatin; sulfamethoxazole; and sulfonamide derivatives.  MEDICATIONS:  Current Outpatient Prescriptions  Medication Sig Dispense Refill  . acyclovir  (ZOVIRAX) 400 MG tablet Take 1 tablet (400 mg total) by mouth 2 (two) times daily.  60 tablet  4  . aspirin 81 MG tablet Take 81 mg by mouth daily.       . Calcium 500-125 MG-UNIT TABS Take 1 tablet by mouth daily.        . cholecalciferol (VITAMIN D) 1000 UNITS tablet Take 1,000 Units by mouth daily.      . colestipol (COLESTID) 1 G tablet Take 1 g by mouth 2 (two) times daily.      Marland Kitchen dexamethasone (DECADRON) 4 MG tablet 10 tablets by mouth every week starting with the first dose of chemotherapy  40 tablet  5  . ezetimibe (ZETIA) 10 MG tablet Take 10 mg by mouth every evening.      Marland Kitchen FeFum-FePoly-FA-B Cmp-C-Biot (INTEGRA PLUS) CAPS TAKE 1 CAPSULE BY MOUTH EVERY MORNING.  30 capsule  3  . fish oil-omega-3 fatty acids 1000 MG capsule Take 1 capsule by mouth daily.       . lansoprazole (PREVACID) 30 MG capsule Take 30 mg by mouth daily at 12 noon.      . metFORMIN (GLUMETZA) 500 MG (MOD) 24 hr tablet Take 500 mg by mouth every evening.      Marland Kitchen OMEGA-3 FATTY ACIDS-VITAMIN E PO Take 1,000 mcg by mouth daily.      . prochlorperazine (COMPAZINE) 10 MG tablet TAKE 1 TABLET (10 MG TOTAL) BY MOUTH EVERY 6 (SIX) HOURS AS NEEDED FOR NAUSEA OR VOMITING.  60 tablet  0   No current facility-administered medications for this visit.    SURGICAL HISTORY:  Past Surgical History  Procedure Laterality Date  . Tubal ligation    . Esophagogastroduodenoscopy  04/15/2007    REVIEW OF SYSTEMS:  Constitutional: positive for fatigue Eyes: negative Ears, nose, mouth, throat, and face: negative Respiratory: negative Cardiovascular: negative Gastrointestinal: negative Genitourinary:negative Integument/breast: negative Hematologic/lymphatic: negative Musculoskeletal:negative Neurological: negative Behavioral/Psych: negative Endocrine: negative Allergic/Immunologic: negative   PHYSICAL EXAMINATION: General appearance: alert, cooperative, fatigued and no distress Head: Normocephalic, without obvious  abnormality, atraumatic Neck: no adenopathy, no JVD, supple, symmetrical, trachea midline and thyroid not enlarged, symmetric, no tenderness/mass/nodules Lymph nodes: Cervical, supraclavicular, and axillary nodes normal. Resp: clear to auscultation bilaterally Back: symmetric, no curvature. ROM normal. No CVA tenderness. Cardio: regular rate and rhythm, S1, S2 normal, no murmur, click, rub or gallop GI: soft, non-tender; bowel sounds normal; no masses,  no organomegaly Extremities: extremities normal, atraumatic, no cyanosis or edema Neurologic: Alert and oriented X 3, normal strength and tone. Normal symmetric reflexes. Normal coordination and gait  ECOG PERFORMANCE STATUS: 1 - Symptomatic but completely ambulatory  Blood pressure 141/72, pulse 58, temperature 98.2 F (36.8 C), temperature source Oral, resp. rate 18, height $RemoveBe'5\' 6"'YOqsAXgRu$  (1.676 m), weight 174 lb 8 oz (79.153 kg).  LABORATORY DATA: Lab Results  Component Value Date   WBC 3.5* 03/07/2014   HGB 11.4* 03/07/2014   HCT 35.3 03/07/2014   MCV 91.5 03/07/2014   PLT 224 03/07/2014      Chemistry      Component Value Date/Time   NA 143 03/07/2014 0813   NA 140 04/14/2013 0916   K 3.5 03/07/2014 0813   K 3.8 04/14/2013 0916   CL 105 04/14/2013 0916   CO2 18* 03/07/2014 0813   CO2 28 04/14/2013 0916   BUN 11.9 03/07/2014 0813   BUN 10 04/14/2013 0916   CREATININE 0.9 03/07/2014 0813   CREATININE 1.0 04/14/2013 0916      Component Value Date/Time   CALCIUM 9.3 03/07/2014 0813   CALCIUM 9.7 04/14/2013 0916   CALCIUM 9.0 12/18/2009 2256   ALKPHOS 49 03/07/2014 0813   ALKPHOS 42 04/14/2013 0916   AST 20 03/07/2014 0813   AST 23 04/14/2013 0916   ALT 13 03/07/2014 0813   ALT 24 04/14/2013 0916   BILITOT 0.66 03/07/2014 0813   BILITOT 0.4 04/14/2013 0916     Other lab results:  Beta-2 microglobulin 2.48, IgG 438, IgA 72, IgM 10. Kappa light chain less than 0.03, free lambda light chain 0.12.  BONE MARROW REPORT FINAL DIAGNOSIS Diagnosis Bone Marrow,  Aspirate,Biopsy, and Clot, right iliac - VARIABLY CELLULAR BONE MARROW WITH TRILINEAGE HEMATOPOIESIS AND 1% PLASMA CELLS. - SEE COMMENT. PERIPHERAL BLOOD: - NORMOCYTIC-NORMOCHROMIC ANEMIA Diagnosis Note The plasma cells represent 1% of all cells in the sample with lack of large aggregates or sheets. Immunohistochemical stains performed on clot and biopsy also confirm the morphologic impression and show an extremely minor plasma cell component that appears to show polyclonal staining pattern for kappa and lambda light chain in a few cells evaluated. As a result, there is no definite morphologic or immunophenotypic evidence of residual plasma cell neoplasm. Clinical correlation is recommended. (BNS:kh/gt 03-03-14) Susanne Greenhouse MD Pathologist, Electronic Signature (Case signed 03/04/2014)  ASSESSMENT AND PLAN: this is a very pleasant 63 years old [place] American female who was recently diagnosed with IgA multiple myeloma with significant elevation of IgA over 7000.  She is currently undergoing systemic chemotherapy with Carfilzomib, Cytoxan and dexamethasone and tolerating it fairly well.  she status post 4 cycles. She tolerated the last 4 cycles of  her treatment fairly well with no significant adverse effects. Her recent myeloma panel in addition to the bone marrow biopsy and aspirate showed significant improvement in her disease with decrease in the plasma cells from 48% to 1%.  I discussed the lab and biopsy results with the patient and her husband. I recommended for her to see Dr. Clydene Laming at [facility] stem cell transplant unit for consideration of autologous peripheral blood stem cell transplant. I would see her back for followup visit after completion of her transplant procedure but she was advised to call immediately she has any concerning symptoms in the interval. The patient voices understanding of current disease status and treatment options and is in agreement with the current care  plan.  All questions were answered. The patient knows to call the clinic with any problems, questions or concerns. We can certainly see the patient much sooner if necessary.  Disclaimer: This note was dictated with voice recognition software. Similar sounding words can inadvertently be transcribed and may not be corrected upon review.

## 2014-03-08 ENCOUNTER — Ambulatory Visit: Payer: 59

## 2014-03-08 ENCOUNTER — Ambulatory Visit: Payer: 59 | Admitting: Physician Assistant

## 2014-03-08 ENCOUNTER — Encounter (HOSPITAL_COMMUNITY): Payer: Self-pay

## 2014-03-09 LAB — TISSUE HYBRIDIZATION (BONE MARROW)-NCBH

## 2014-03-09 LAB — CHROMOSOME ANALYSIS, BONE MARROW

## 2014-03-11 ENCOUNTER — Encounter: Payer: Self-pay | Admitting: *Deleted

## 2014-03-11 ENCOUNTER — Other Ambulatory Visit: Payer: Self-pay | Admitting: Internal Medicine

## 2014-03-11 ENCOUNTER — Telehealth: Payer: Self-pay | Admitting: *Deleted

## 2014-03-11 DIAGNOSIS — C9001 Multiple myeloma in remission: Secondary | ICD-10-CM

## 2014-03-11 NOTE — Telephone Encounter (Signed)
Per staff phone call and POF I have schedueld appts. Order for patient to see APP on 6/29. APP schedules full, sent MD staff message to advise new appts. Patient needs am appts due to transportation. Patient aware of appts. JMW

## 2014-03-14 ENCOUNTER — Other Ambulatory Visit: Payer: 59

## 2014-03-14 ENCOUNTER — Telehealth: Payer: Self-pay | Admitting: *Deleted

## 2014-03-14 ENCOUNTER — Other Ambulatory Visit (HOSPITAL_BASED_OUTPATIENT_CLINIC_OR_DEPARTMENT_OTHER): Payer: 59

## 2014-03-14 ENCOUNTER — Ambulatory Visit: Payer: 59

## 2014-03-14 ENCOUNTER — Ambulatory Visit (HOSPITAL_BASED_OUTPATIENT_CLINIC_OR_DEPARTMENT_OTHER): Payer: 59

## 2014-03-14 VITALS — BP 116/79 | HR 76 | Temp 98.3°F

## 2014-03-14 DIAGNOSIS — C9000 Multiple myeloma not having achieved remission: Secondary | ICD-10-CM

## 2014-03-14 DIAGNOSIS — C9001 Multiple myeloma in remission: Secondary | ICD-10-CM

## 2014-03-14 DIAGNOSIS — Z5112 Encounter for antineoplastic immunotherapy: Secondary | ICD-10-CM

## 2014-03-14 LAB — COMPREHENSIVE METABOLIC PANEL (CC13)
ALT: 14 U/L (ref 0–55)
AST: 17 U/L (ref 5–34)
Albumin: 4 g/dL (ref 3.5–5.0)
Alkaline Phosphatase: 54 U/L (ref 40–150)
Anion Gap: 9 mEq/L (ref 3–11)
BUN: 16.7 mg/dL (ref 7.0–26.0)
CO2: 24 mEq/L (ref 22–29)
CREATININE: 1 mg/dL (ref 0.6–1.1)
Calcium: 9.3 mg/dL (ref 8.4–10.4)
Chloride: 109 mEq/L (ref 98–109)
GLUCOSE: 103 mg/dL (ref 70–140)
Potassium: 3.9 mEq/L (ref 3.5–5.1)
Sodium: 141 mEq/L (ref 136–145)
Total Bilirubin: 0.65 mg/dL (ref 0.20–1.20)
Total Protein: 6.3 g/dL — ABNORMAL LOW (ref 6.4–8.3)

## 2014-03-14 LAB — CBC WITH DIFFERENTIAL/PLATELET
BASO%: 1 % (ref 0.0–2.0)
Basophils Absolute: 0.1 10*3/uL (ref 0.0–0.1)
EOS%: 2 % (ref 0.0–7.0)
Eosinophils Absolute: 0.1 10*3/uL (ref 0.0–0.5)
HCT: 34 % — ABNORMAL LOW (ref 34.8–46.6)
HGB: 11.1 g/dL — ABNORMAL LOW (ref 11.6–15.9)
LYMPH%: 29.4 % (ref 14.0–49.7)
MCH: 29.1 pg (ref 25.1–34.0)
MCHC: 32.6 g/dL (ref 31.5–36.0)
MCV: 89.2 fL (ref 79.5–101.0)
MONO#: 0.5 10*3/uL (ref 0.1–0.9)
MONO%: 9.3 % (ref 0.0–14.0)
NEUT#: 2.9 10*3/uL (ref 1.5–6.5)
NEUT%: 58.3 % (ref 38.4–76.8)
Platelets: 268 10*3/uL (ref 145–400)
RBC: 3.81 10*6/uL (ref 3.70–5.45)
RDW: 14.8 % — ABNORMAL HIGH (ref 11.2–14.5)
WBC: 4.9 10*3/uL (ref 3.9–10.3)
lymph#: 1.5 10*3/uL (ref 0.9–3.3)
nRBC: 0 % (ref 0–0)

## 2014-03-14 MED ORDER — DEXAMETHASONE SODIUM PHOSPHATE 10 MG/ML IJ SOLN
10.0000 mg | Freq: Once | INTRAMUSCULAR | Status: AC
  Administered 2014-03-14: 10 mg via INTRAVENOUS

## 2014-03-14 MED ORDER — CYCLOPHOSPHAMIDE CHEMO INJECTION 1 GM
300.0000 mg/m2 | Freq: Once | INTRAMUSCULAR | Status: AC
  Administered 2014-03-14: 580 mg via INTRAVENOUS
  Filled 2014-03-14: qty 29

## 2014-03-14 MED ORDER — DEXTROSE 5 % IV SOLN
36.0000 mg/m2 | Freq: Once | INTRAVENOUS | Status: AC
  Administered 2014-03-14: 68 mg via INTRAVENOUS
  Filled 2014-03-14: qty 34

## 2014-03-14 MED ORDER — ZOLEDRONIC ACID 4 MG/100ML IV SOLN
4.0000 mg | Freq: Once | INTRAVENOUS | Status: AC
  Administered 2014-03-14: 4 mg via INTRAVENOUS
  Filled 2014-03-14: qty 100

## 2014-03-14 MED ORDER — SODIUM CHLORIDE 0.9 % IV SOLN
Freq: Once | INTRAVENOUS | Status: AC
  Administered 2014-03-14: 15:00:00 via INTRAVENOUS

## 2014-03-14 MED ORDER — DEXAMETHASONE SODIUM PHOSPHATE 10 MG/ML IJ SOLN
INTRAMUSCULAR | Status: AC
  Filled 2014-03-14: qty 1

## 2014-03-14 MED ORDER — ONDANSETRON 8 MG/NS 50 ML IVPB
INTRAVENOUS | Status: AC
  Filled 2014-03-14: qty 8

## 2014-03-14 MED ORDER — ONDANSETRON 8 MG/50ML IVPB (CHCC)
8.0000 mg | Freq: Once | INTRAVENOUS | Status: AC
  Administered 2014-03-14: 8 mg via INTRAVENOUS

## 2014-03-14 NOTE — Telephone Encounter (Signed)
Per staff message I have finished appts.  Patient will pick up schedule at next appt

## 2014-03-14 NOTE — Patient Instructions (Addendum)
[facility] Discharge Instructions for Patients Receiving Chemotherapy  Today you received the following chemotherapy agents Kyprolis/Cytoxan To help prevent nausea and vomiting after your treatment, we encourage you to take your nausea medication as prescribed.  If you develop nausea and vomiting that is not controlled by your nausea medication, call the clinic.   BELOW ARE SYMPTOMS THAT SHOULD BE REPORTED IMMEDIATELY:  *FEVER GREATER THAN 100.5 F  *CHILLS WITH OR WITHOUT FEVER  NAUSEA AND VOMITING THAT IS NOT CONTROLLED WITH YOUR NAUSEA MEDICATION  *UNUSUAL SHORTNESS OF BREATH  *UNUSUAL BRUISING OR BLEEDING  TENDERNESS IN MOUTH AND THROAT WITH OR WITHOUT PRESENCE OF ULCERS  *URINARY PROBLEMS  *BOWEL PROBLEMS  UNUSUAL RASH Items with * indicate a potential emergency and should be followed up as soon as possible.  Feel free to call the clinic you have any questions or concerns. The clinic phone number is [phone].   Zoledronic Acid injection (Hypercalcemia, Oncology) (Zometa) What is this medicine? ZOLEDRONIC ACID (ZOE le dron ik AS id) lowers the amount of calcium loss from bone. It is used to treat too much calcium in your blood from cancer. It is also used to prevent complications of cancer that has spread to the bone. This medicine may be used for other purposes; ask your health care provider or pharmacist if you have questions. COMMON BRAND NAME(S): Zometa What should I tell my health care provider before I take this medicine? They need to know if you have any of these conditions: -aspirin-sensitive asthma -cancer, especially if you are receiving medicines used to treat cancer -dental disease or wear dentures -infection -kidney disease -receiving corticosteroids like dexamethasone or prednisone -an unusual or allergic reaction to zoledronic acid, other medicines, foods, dyes, or preservatives -pregnant or trying to get  pregnant -breast-feeding How should I use this medicine? This medicine is for infusion into a vein. It is given by a health care professional in a hospital or clinic setting. Talk to your pediatrician regarding the use of this medicine in children. Special care may be needed. Overdosage: If you think you have taken too much of this medicine contact a poison control center or emergency room at once. NOTE: This medicine is only for you. Do not share this medicine with others. What if I miss a dose? It is important not to miss your dose. Call your doctor or health care professional if you are unable to keep an appointment. What may interact with this medicine? -certain antibiotics given by injection -NSAIDs, medicines for pain and inflammation, like ibuprofen or naproxen -some diuretics like bumetanide, furosemide -teriparatide -thalidomide This list may not describe all possible interactions. Give your health care provider a list of all the medicines, herbs, non-prescription drugs, or dietary supplements you use. Also tell them if you smoke, drink alcohol, or use illegal drugs. Some items may interact with your medicine. What should I watch for while using this medicine? Visit your doctor or health care professional for regular checkups. It may be some time before you see the benefit from this medicine. Do not stop taking your medicine unless your doctor tells you to. Your doctor may order blood tests or other tests to see how you are doing. Women should inform their doctor if they wish to become pregnant or think they might be pregnant. There is a potential for serious side effects to an unborn child. Talk to your health care professional or pharmacist for more information. You should make sure that you get enough  calcium and vitamin D while you are taking this medicine. Discuss the foods you eat and the vitamins you take with your health care professional. Some people who take this medicine have  severe bone, joint, and/or muscle pain. This medicine may also increase your risk for jaw problems or a broken thigh bone. Tell your doctor right away if you have severe pain in your jaw, bones, joints, or muscles. Tell your doctor if you have any pain that does not go away or that gets worse. Tell your dentist and dental surgeon that you are taking this medicine. You should not have major dental surgery while on this medicine. See your dentist to have a dental exam and fix any dental problems before starting this medicine. Take good care of your teeth while on this medicine. Make sure you see your dentist for regular follow-up appointments. What side effects may I notice from receiving this medicine? Side effects that you should report to your doctor or health care professional as soon as possible: -allergic reactions like skin rash, itching or hives, swelling of the face, lips, or tongue -anxiety, confusion, or depression -breathing problems -changes in vision -eye pain -feeling faint or lightheaded, falls -jaw pain, especially after dental work -mouth sores -muscle cramps, stiffness, or weakness -trouble passing urine or change in the amount of urine Side effects that usually do not require medical attention (report to your doctor or health care professional if they continue or are bothersome): -bone, joint, or muscle pain -constipation -diarrhea -fever -hair loss -irritation at site where injected -loss of appetite -nausea, vomiting -stomach upset -trouble sleeping -trouble swallowing -weak or tired This list may not describe all possible side effects. Call your doctor for medical advice about side effects. You may report side effects to FDA at 1-800-FDA-1088. Where should I keep my medicine? This drug is given in a hospital or clinic and will not be stored at home. NOTE: This sheet is a summary. It may not cover all possible information. If you have questions about this medicine,  talk to your doctor, pharmacist, or health care provider.  2014, [employer]vier/Gold Standard. (2013-02-25 13:03:13)

## 2014-03-15 ENCOUNTER — Ambulatory Visit: Payer: 59

## 2014-03-15 ENCOUNTER — Telehealth: Payer: Self-pay | Admitting: *Deleted

## 2014-03-15 ENCOUNTER — Ambulatory Visit (HOSPITAL_BASED_OUTPATIENT_CLINIC_OR_DEPARTMENT_OTHER): Payer: 59

## 2014-03-15 VITALS — BP 145/91 | HR 89 | Temp 97.0°F

## 2014-03-15 DIAGNOSIS — C9000 Multiple myeloma not having achieved remission: Secondary | ICD-10-CM

## 2014-03-15 DIAGNOSIS — Z5112 Encounter for antineoplastic immunotherapy: Secondary | ICD-10-CM

## 2014-03-15 MED ORDER — ONDANSETRON 8 MG/NS 50 ML IVPB
INTRAVENOUS | Status: AC
  Filled 2014-03-15: qty 8

## 2014-03-15 MED ORDER — DEXAMETHASONE SODIUM PHOSPHATE 10 MG/ML IJ SOLN
INTRAMUSCULAR | Status: AC
  Filled 2014-03-15: qty 1

## 2014-03-15 MED ORDER — ONDANSETRON 8 MG/50ML IVPB (CHCC)
8.0000 mg | Freq: Once | INTRAVENOUS | Status: AC
  Administered 2014-03-15: 8 mg via INTRAVENOUS

## 2014-03-15 MED ORDER — DEXAMETHASONE SODIUM PHOSPHATE 10 MG/ML IJ SOLN
10.0000 mg | Freq: Once | INTRAMUSCULAR | Status: AC
  Administered 2014-03-15: 10 mg via INTRAVENOUS

## 2014-03-15 MED ORDER — DEXTROSE 5 % IV SOLN
36.0000 mg/m2 | Freq: Once | INTRAVENOUS | Status: AC
  Administered 2014-03-15: 68 mg via INTRAVENOUS
  Filled 2014-03-15: qty 34

## 2014-03-15 MED ORDER — SODIUM CHLORIDE 0.9 % IV SOLN
Freq: Once | INTRAVENOUS | Status: AC
  Administered 2014-03-15: 09:00:00 via INTRAVENOUS

## 2014-03-15 NOTE — Patient Instructions (Signed)
[facility] Discharge Instructions for Patients Receiving Chemotherapy  Today you received the following chemotherapy agents Kyprolis.  To help prevent nausea and vomiting after your treatment, we encourage you to take your nausea medication.   If you develop nausea and vomiting that is not controlled by your nausea medication, call the clinic.   BELOW ARE SYMPTOMS THAT SHOULD BE REPORTED IMMEDIATELY:  *FEVER GREATER THAN 100.5 F  *CHILLS WITH OR WITHOUT FEVER  NAUSEA AND VOMITING THAT IS NOT CONTROLLED WITH YOUR NAUSEA MEDICATION  *UNUSUAL SHORTNESS OF BREATH  *UNUSUAL BRUISING OR BLEEDING  TENDERNESS IN MOUTH AND THROAT WITH OR WITHOUT PRESENCE OF ULCERS  *URINARY PROBLEMS  *BOWEL PROBLEMS  UNUSUAL RASH Items with * indicate a potential emergency and should be followed up as soon as possible.  Feel free to call the clinic you have any questions or concerns. The clinic phone number is [phone].    

## 2014-03-15 NOTE — Telephone Encounter (Signed)
Received call from pt's case manager at the [facility].  They are requesting a recent h and p from Dr Vista Mink.  Call back [phone] ext [phone].  In EPIC there is no medical records release information from [facility].  Called and left msg to fax over a medical records release so we can fax over any records.  SLJ

## 2014-03-21 ENCOUNTER — Other Ambulatory Visit: Payer: 59

## 2014-03-21 ENCOUNTER — Ambulatory Visit: Payer: 59

## 2014-03-21 ENCOUNTER — Ambulatory Visit (HOSPITAL_BASED_OUTPATIENT_CLINIC_OR_DEPARTMENT_OTHER): Payer: 59

## 2014-03-21 VITALS — BP 135/75 | HR 97 | Temp 98.9°F

## 2014-03-21 DIAGNOSIS — C9001 Multiple myeloma in remission: Secondary | ICD-10-CM

## 2014-03-21 DIAGNOSIS — Z5111 Encounter for antineoplastic chemotherapy: Secondary | ICD-10-CM

## 2014-03-21 DIAGNOSIS — Z5112 Encounter for antineoplastic immunotherapy: Secondary | ICD-10-CM

## 2014-03-21 DIAGNOSIS — C9000 Multiple myeloma not having achieved remission: Secondary | ICD-10-CM

## 2014-03-21 LAB — CBC WITH DIFFERENTIAL/PLATELET
BASO%: 0.5 % (ref 0.0–2.0)
BASOS ABS: 0 10*3/uL (ref 0.0–0.1)
EOS%: 1.3 % (ref 0.0–7.0)
Eosinophils Absolute: 0.1 10*3/uL (ref 0.0–0.5)
HEMATOCRIT: 32.6 % — AB (ref 34.8–46.6)
HGB: 10.6 g/dL — ABNORMAL LOW (ref 11.6–15.9)
LYMPH%: 9.3 % — ABNORMAL LOW (ref 14.0–49.7)
MCH: 29.4 pg (ref 25.1–34.0)
MCHC: 32.6 g/dL (ref 31.5–36.0)
MCV: 90.3 fL (ref 79.5–101.0)
MONO#: 1.1 10*3/uL — ABNORMAL HIGH (ref 0.1–0.9)
MONO%: 14.9 % — ABNORMAL HIGH (ref 0.0–14.0)
NEUT#: 5.3 10*3/uL (ref 1.5–6.5)
NEUT%: 74 % (ref 38.4–76.8)
PLATELETS: 143 10*3/uL — AB (ref 145–400)
RBC: 3.62 10*6/uL — ABNORMAL LOW (ref 3.70–5.45)
RDW: 14.9 % — ABNORMAL HIGH (ref 11.2–14.5)
WBC: 7.1 10*3/uL (ref 3.9–10.3)
lymph#: 0.7 10*3/uL — ABNORMAL LOW (ref 0.9–3.3)

## 2014-03-21 LAB — COMPREHENSIVE METABOLIC PANEL (CC13)
ALT: 42 U/L (ref 0–55)
AST: 28 U/L (ref 5–34)
Albumin: 3.4 g/dL — ABNORMAL LOW (ref 3.5–5.0)
Alkaline Phosphatase: 64 U/L (ref 40–150)
Anion Gap: 9 mEq/L (ref 3–11)
BILIRUBIN TOTAL: 0.77 mg/dL (ref 0.20–1.20)
BUN: 9.1 mg/dL (ref 7.0–26.0)
CO2: 23 mEq/L (ref 22–29)
CREATININE: 0.9 mg/dL (ref 0.6–1.1)
Calcium: 9.3 mg/dL (ref 8.4–10.4)
Chloride: 107 mEq/L (ref 98–109)
Glucose: 109 mg/dl (ref 70–140)
Potassium: 3.7 mEq/L (ref 3.5–5.1)
Sodium: 139 mEq/L (ref 136–145)
Total Protein: 6.1 g/dL — ABNORMAL LOW (ref 6.4–8.3)

## 2014-03-21 MED ORDER — SODIUM CHLORIDE 0.9 % IV SOLN
Freq: Once | INTRAVENOUS | Status: AC
  Administered 2014-03-21: 09:00:00 via INTRAVENOUS

## 2014-03-21 MED ORDER — ONDANSETRON 8 MG/NS 50 ML IVPB
INTRAVENOUS | Status: AC
  Filled 2014-03-21: qty 8

## 2014-03-21 MED ORDER — DEXAMETHASONE SODIUM PHOSPHATE 10 MG/ML IJ SOLN
10.0000 mg | Freq: Once | INTRAMUSCULAR | Status: AC
  Administered 2014-03-21: 10 mg via INTRAVENOUS

## 2014-03-21 MED ORDER — SODIUM CHLORIDE 0.9 % IV SOLN
300.0000 mg/m2 | Freq: Once | INTRAVENOUS | Status: AC
  Administered 2014-03-21: 580 mg via INTRAVENOUS
  Filled 2014-03-21: qty 29

## 2014-03-21 MED ORDER — ONDANSETRON 8 MG/50ML IVPB (CHCC)
8.0000 mg | Freq: Once | INTRAVENOUS | Status: AC
  Administered 2014-03-21: 8 mg via INTRAVENOUS

## 2014-03-21 MED ORDER — DEXTROSE 5 % IV SOLN
36.0000 mg/m2 | Freq: Once | INTRAVENOUS | Status: AC
  Administered 2014-03-21: 68 mg via INTRAVENOUS
  Filled 2014-03-21: qty 34

## 2014-03-21 MED ORDER — DEXAMETHASONE SODIUM PHOSPHATE 10 MG/ML IJ SOLN
INTRAMUSCULAR | Status: AC
  Filled 2014-03-21: qty 1

## 2014-03-21 NOTE — Patient Instructions (Addendum)
[facility] Discharge Instructions for Patients Receiving Chemotherapy  Today you received the following chemotherapy agents kyprolis and cytoxan.  To help prevent nausea and vomiting after your treatment, we encourage you to take your nausea medication compazine every 6-8 hours as needed.   If you develop nausea and vomiting that is not controlled by your nausea medication, call the clinic.   BELOW ARE SYMPTOMS THAT SHOULD BE REPORTED IMMEDIATELY:  *FEVER GREATER THAN 100.5 F  *CHILLS WITH OR WITHOUT FEVER  NAUSEA AND VOMITING THAT IS NOT CONTROLLED WITH YOUR NAUSEA MEDICATION  *UNUSUAL SHORTNESS OF BREATH  *UNUSUAL BRUISING OR BLEEDING  TENDERNESS IN MOUTH AND THROAT WITH OR WITHOUT PRESENCE OF ULCERS  *URINARY PROBLEMS  *BOWEL PROBLEMS  UNUSUAL RASH Items with * indicate a potential emergency and should be followed up as soon as possible.  Feel free to call the clinic you have any questions or concerns. The clinic phone number is [phone].

## 2014-03-22 ENCOUNTER — Ambulatory Visit (HOSPITAL_BASED_OUTPATIENT_CLINIC_OR_DEPARTMENT_OTHER): Payer: 59

## 2014-03-22 ENCOUNTER — Ambulatory Visit: Payer: 59

## 2014-03-22 VITALS — BP 138/88 | HR 79 | Temp 97.9°F | Resp 19

## 2014-03-22 DIAGNOSIS — C9000 Multiple myeloma not having achieved remission: Secondary | ICD-10-CM

## 2014-03-22 DIAGNOSIS — Z5112 Encounter for antineoplastic immunotherapy: Secondary | ICD-10-CM

## 2014-03-22 MED ORDER — DEXTROSE 5 % IV SOLN
36.0000 mg/m2 | Freq: Once | INTRAVENOUS | Status: AC
  Administered 2014-03-22: 68 mg via INTRAVENOUS
  Filled 2014-03-22: qty 34

## 2014-03-22 MED ORDER — SODIUM CHLORIDE 0.9 % IV SOLN
Freq: Once | INTRAVENOUS | Status: AC
  Administered 2014-03-22: 08:00:00 via INTRAVENOUS

## 2014-03-22 MED ORDER — ONDANSETRON 8 MG/50ML IVPB (CHCC)
8.0000 mg | Freq: Once | INTRAVENOUS | Status: AC
  Administered 2014-03-22: 8 mg via INTRAVENOUS

## 2014-03-22 MED ORDER — DEXAMETHASONE SODIUM PHOSPHATE 10 MG/ML IJ SOLN
INTRAMUSCULAR | Status: AC
  Filled 2014-03-22: qty 1

## 2014-03-22 MED ORDER — ONDANSETRON 8 MG/NS 50 ML IVPB
INTRAVENOUS | Status: AC
  Filled 2014-03-22: qty 8

## 2014-03-22 MED ORDER — DEXAMETHASONE SODIUM PHOSPHATE 10 MG/ML IJ SOLN
10.0000 mg | Freq: Once | INTRAMUSCULAR | Status: AC
  Administered 2014-03-22: 10 mg via INTRAVENOUS

## 2014-03-22 NOTE — Patient Instructions (Signed)
[facility] Discharge Instructions for Patients Receiving Chemotherapy  Today you received the following chemotherapy agents: Kyprolis  To help prevent nausea and vomiting after your treatment, we encourage you to take your nausea medication as prescribed by your physician.   If you develop nausea and vomiting that is not controlled by your nausea medication, call the clinic.   BELOW ARE SYMPTOMS THAT SHOULD BE REPORTED IMMEDIATELY:  *FEVER GREATER THAN 100.5 F  *CHILLS WITH OR WITHOUT FEVER  NAUSEA AND VOMITING THAT IS NOT CONTROLLED WITH YOUR NAUSEA MEDICATION  *UNUSUAL SHORTNESS OF BREATH  *UNUSUAL BRUISING OR BLEEDING  TENDERNESS IN MOUTH AND THROAT WITH OR WITHOUT PRESENCE OF ULCERS  *URINARY PROBLEMS  *BOWEL PROBLEMS  UNUSUAL RASH Items with * indicate a potential emergency and should be followed up as soon as possible.  Feel free to call the clinic you have any questions or concerns. The clinic phone number is [phone].    

## 2014-03-24 ENCOUNTER — Other Ambulatory Visit: Payer: Self-pay | Admitting: *Deleted

## 2014-03-24 ENCOUNTER — Telehealth: Payer: Self-pay | Admitting: *Deleted

## 2014-03-24 NOTE — Telephone Encounter (Signed)
Received request from Jana Half at [facility] requesting records from [facility] on 03/02/14.  Records faxed to [phone].  SLJ

## 2014-03-28 ENCOUNTER — Encounter: Payer: Self-pay | Admitting: Internal Medicine

## 2014-03-28 ENCOUNTER — Ambulatory Visit: Payer: 59 | Admitting: Physician Assistant

## 2014-03-28 ENCOUNTER — Ambulatory Visit (HOSPITAL_BASED_OUTPATIENT_CLINIC_OR_DEPARTMENT_OTHER): Payer: 59

## 2014-03-28 ENCOUNTER — Ambulatory Visit (HOSPITAL_BASED_OUTPATIENT_CLINIC_OR_DEPARTMENT_OTHER): Payer: 59 | Admitting: Internal Medicine

## 2014-03-28 ENCOUNTER — Other Ambulatory Visit (HOSPITAL_BASED_OUTPATIENT_CLINIC_OR_DEPARTMENT_OTHER): Payer: 59

## 2014-03-28 VITALS — BP 121/64 | HR 81 | Temp 98.2°F | Resp 18 | Ht 66.0 in | Wt 172.2 lb

## 2014-03-28 DIAGNOSIS — C9001 Multiple myeloma in remission: Secondary | ICD-10-CM

## 2014-03-28 DIAGNOSIS — C9000 Multiple myeloma not having achieved remission: Secondary | ICD-10-CM

## 2014-03-28 DIAGNOSIS — Z9484 Stem cells transplant status: Secondary | ICD-10-CM

## 2014-03-28 DIAGNOSIS — Z5112 Encounter for antineoplastic immunotherapy: Secondary | ICD-10-CM

## 2014-03-28 LAB — CBC WITH DIFFERENTIAL/PLATELET
BASO%: 0.5 % (ref 0.0–2.0)
BASOS ABS: 0 10*3/uL (ref 0.0–0.1)
EOS%: 1.4 % (ref 0.0–7.0)
Eosinophils Absolute: 0.1 10*3/uL (ref 0.0–0.5)
HCT: 29.1 % — ABNORMAL LOW (ref 34.8–46.6)
HEMOGLOBIN: 9.6 g/dL — AB (ref 11.6–15.9)
LYMPH%: 11.7 % — ABNORMAL LOW (ref 14.0–49.7)
MCH: 29.9 pg (ref 25.1–34.0)
MCHC: 32.9 g/dL (ref 31.5–36.0)
MCV: 91.1 fL (ref 79.5–101.0)
MONO#: 0.8 10*3/uL (ref 0.1–0.9)
MONO%: 12.6 % (ref 0.0–14.0)
NEUT%: 73.8 % (ref 38.4–76.8)
NEUTROS ABS: 4.6 10*3/uL (ref 1.5–6.5)
Platelets: 235 10*3/uL (ref 145–400)
RBC: 3.19 10*6/uL — AB (ref 3.70–5.45)
RDW: 14.6 % — AB (ref 11.2–14.5)
WBC: 6.3 10*3/uL (ref 3.9–10.3)
lymph#: 0.7 10*3/uL — ABNORMAL LOW (ref 0.9–3.3)

## 2014-03-28 LAB — COMPREHENSIVE METABOLIC PANEL (CC13)
ALT: 49 U/L (ref 0–55)
AST: 28 U/L (ref 5–34)
Albumin: 3.3 g/dL — ABNORMAL LOW (ref 3.5–5.0)
Alkaline Phosphatase: 85 U/L (ref 40–150)
Anion Gap: 9 mEq/L (ref 3–11)
BUN: 9.2 mg/dL (ref 7.0–26.0)
CALCIUM: 9.4 mg/dL (ref 8.4–10.4)
CHLORIDE: 108 meq/L (ref 98–109)
CO2: 24 meq/L (ref 22–29)
Creatinine: 0.8 mg/dL (ref 0.6–1.1)
GLUCOSE: 99 mg/dL (ref 70–140)
POTASSIUM: 3.6 meq/L (ref 3.5–5.1)
SODIUM: 140 meq/L (ref 136–145)
TOTAL PROTEIN: 6.2 g/dL — AB (ref 6.4–8.3)
Total Bilirubin: 0.61 mg/dL (ref 0.20–1.20)

## 2014-03-28 MED ORDER — SODIUM CHLORIDE 0.9 % IV SOLN: Freq: Once | INTRAVENOUS | Status: DC

## 2014-03-28 MED ORDER — DEXTROSE 5 % IV SOLN
36.0000 mg/m2 | Freq: Once | INTRAVENOUS | Status: AC
  Administered 2014-03-28: 68 mg via INTRAVENOUS
  Filled 2014-03-28: qty 34

## 2014-03-28 MED ORDER — SODIUM CHLORIDE 0.9 % IV SOLN
300.0000 mg/m2 | Freq: Once | INTRAVENOUS | Status: AC
  Administered 2014-03-28: 580 mg via INTRAVENOUS
  Filled 2014-03-28: qty 29

## 2014-03-28 MED ORDER — ONDANSETRON 8 MG/50ML IVPB (CHCC)
8.0000 mg | Freq: Once | INTRAVENOUS | Status: AC
  Administered 2014-03-28: 8 mg via INTRAVENOUS

## 2014-03-28 MED ORDER — ONDANSETRON 8 MG/NS 50 ML IVPB
INTRAVENOUS | Status: AC
  Filled 2014-03-28: qty 8

## 2014-03-28 MED ORDER — SODIUM CHLORIDE 0.9 % IV SOLN
Freq: Once | INTRAVENOUS | Status: AC
  Administered 2014-03-28: 11:00:00 via INTRAVENOUS

## 2014-03-28 MED ORDER — DEXAMETHASONE SODIUM PHOSPHATE 10 MG/ML IJ SOLN
INTRAMUSCULAR | Status: AC
  Filled 2014-03-28: qty 1

## 2014-03-28 MED ORDER — DEXAMETHASONE SODIUM PHOSPHATE 10 MG/ML IJ SOLN
10.0000 mg | Freq: Once | INTRAMUSCULAR | Status: AC
  Administered 2014-03-28: 10 mg via INTRAVENOUS

## 2014-03-28 NOTE — Patient Instructions (Signed)
[facility] Discharge Instructions for Patients Receiving Chemotherapy  Today you received the following chemotherapy agents cytoxan/kyprolis   To help prevent nausea and vomiting after your treatment, we encourage you to take your nausea medication as directed  If you develop nausea and vomiting that is not controlled by your nausea medication, call the clinic.   BELOW ARE SYMPTOMS THAT SHOULD BE REPORTED IMMEDIATELY:  *FEVER GREATER THAN 100.5 F  *CHILLS WITH OR WITHOUT FEVER  NAUSEA AND VOMITING THAT IS NOT CONTROLLED WITH YOUR NAUSEA MEDICATION  *UNUSUAL SHORTNESS OF BREATH  *UNUSUAL BRUISING OR BLEEDING  TENDERNESS IN MOUTH AND THROAT WITH OR WITHOUT PRESENCE OF ULCERS  *URINARY PROBLEMS  *BOWEL PROBLEMS  UNUSUAL RASH Items with * indicate a potential emergency and should be followed up as soon as possible.  Feel free to call the clinic you have any questions or concerns. The clinic phone number is [phone].  

## 2014-03-28 NOTE — Progress Notes (Signed)
[place] Telephone:(336) [phone]   Fax:(336) [phone]  OFFICE PROGRESS NOTE  [name], MD 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 First dose on 11/15/2013. Status post 4 cycles and she is here today for day 15 of cycle #5.  INTERVAL HISTORY: Victoria Gates 63 y.o. female returns to the clinic today for follow up visit accompanied by her husband. The patient is on systemic chemotherapy with Carfilzomib, Cytoxan and dexamethasone as she is waiting for the autologous peripheral blood stem cell transplant at [facility]. She she is tolerating her treatment fairly well. She denied having any significant fever or chills. She denied having any significant chest pain, shortness of breath, cough or hemoptysis. The patient denied having any significant weight loss or night sweats. She has no nausea or vomiting.   MEDICAL HISTORY: Past Medical History  Diagnosis Date  . COLONIC POLYPS, HX OF 04/24/2007  . OSTEOPOROSIS 04/24/2007  . GERD 09/21/2010  . DM 03/18/2008  . HYPERCHOLESTEROLEMIA 07/03/2009  . ANEMIA-IRON DEFICIENCY 04/24/2007  . ALLERGIC RHINITIS CAUSE UNSPECIFIED 01/02/2009  . ESOPHAGEAL STRICTURE 04/15/2007    s/p dilitation  . HIATAL HERNIA 04/15/2007  . CERVICAL RADICULOPATHY, LEFT 02/01/2010  . 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 Chest pain 03/2012    a. 04/15/2012 Ex MV: Ex time 10 mins, EF 63%, no ischemia/infarct.  Occas PAC's/PVC's.    ALLERGIES:  is allergic to atorvastatin; rosuvastatin; sulfamethoxazole; and sulfonamide derivatives.  MEDICATIONS:  Current Outpatient Prescriptions  Medication Sig Dispense Refill  . acyclovir (ZOVIRAX) 400 MG tablet Take 1 tablet (400 mg total) by mouth 2 (two) times daily.  60 tablet  4  . aspirin 81 MG tablet Take 81 mg by mouth  daily.       . Calcium 500-125 MG-UNIT TABS Take 1 tablet by mouth daily.        . cholecalciferol (VITAMIN D) 1000 UNITS tablet Take 1,000 Units by mouth daily.      . colestipol (COLESTID) 1 G tablet Take 1 g by mouth 2 (two) times daily.      Marland Kitchen dexamethasone (DECADRON) 4 MG tablet 10 tablets by mouth every week starting with the first dose of chemotherapy  40 tablet  5  . ezetimibe (ZETIA) 10 MG tablet Take 10 mg by mouth every evening.      Marland Kitchen FeFum-FePoly-FA-B Cmp-C-Biot (INTEGRA PLUS) CAPS TAKE 1 CAPSULE BY MOUTH EVERY MORNING.  30 capsule  3  . fish oil-omega-3 fatty acids 1000 MG capsule Take 1 capsule by mouth daily.       . lansoprazole (PREVACID) 30 MG capsule Take 30 mg by mouth daily at 12 noon.      . metFORMIN (GLUMETZA) 500 MG (MOD) 24 hr tablet Take 500 mg by mouth every evening.      Marland Kitchen OMEGA-3 FATTY ACIDS-VITAMIN E PO Take 1,000 mcg by mouth daily.      . prochlorperazine (COMPAZINE) 10 MG tablet TAKE 1 TABLET (10 MG TOTAL) BY MOUTH EVERY 6 (SIX) HOURS AS NEEDED FOR NAUSEA OR VOMITING.  60 tablet  0   No current facility-administered medications for this visit.    SURGICAL HISTORY:  Past Surgical History  Procedure Laterality Date  . Tubal ligation    . Esophagogastroduodenoscopy  04/15/2007    REVIEW OF SYSTEMS:  Constitutional: positive for  fatigue Eyes: negative Ears, nose, mouth, throat, and face: negative Respiratory: negative Cardiovascular: negative Gastrointestinal: negative Genitourinary:negative Integument/breast: negative Hematologic/lymphatic: negative Musculoskeletal:negative Neurological: negative Behavioral/Psych: negative Endocrine: negative Allergic/Immunologic: negative   PHYSICAL EXAMINATION: General appearance: alert, cooperative, fatigued and no distress Head: Normocephalic, without obvious abnormality, atraumatic Neck: no adenopathy, no JVD, supple, symmetrical, trachea midline and thyroid not enlarged, symmetric, no  tenderness/mass/nodules Lymph nodes: Cervical, supraclavicular, and axillary nodes normal. Resp: clear to auscultation bilaterally Back: symmetric, no curvature. ROM normal. No CVA tenderness. Cardio: regular rate and rhythm, S1, S2 normal, no murmur, click, rub or gallop GI: soft, non-tender; bowel sounds normal; no masses,  no organomegaly Extremities: extremities normal, atraumatic, no cyanosis or edema Neurologic: Alert and oriented X 3, normal strength and tone. Normal symmetric reflexes. Normal coordination and gait  ECOG PERFORMANCE STATUS: 1 - Symptomatic but completely ambulatory  Blood pressure 121/64, pulse 81, temperature 98.2 F (36.8 C), temperature source Oral, resp. rate 18, height 5' 6" (1.676 m), weight 172 lb 3.2 oz (78.109 kg).  LABORATORY DATA: Lab Results  Component Value Date   WBC 6.3 03/28/2014   HGB 9.6* 03/28/2014   HCT 29.1* 03/28/2014   MCV 91.1 03/28/2014   PLT 235 03/28/2014      Chemistry      Component Value Date/Time   NA 139 03/21/2014 0819   NA 140 04/14/2013 0916   K 3.7 03/21/2014 0819   K 3.8 04/14/2013 0916   CL 105 04/14/2013 0916   CO2 23 03/21/2014 0819   CO2 28 04/14/2013 0916   BUN 9.1 03/21/2014 0819   BUN 10 04/14/2013 0916   CREATININE 0.9 03/21/2014 0819   CREATININE 1.0 04/14/2013 0916      Component Value Date/Time   CALCIUM 9.3 03/21/2014 0819   CALCIUM 9.7 04/14/2013 0916   CALCIUM 9.0 12/18/2009 2256   ALKPHOS 64 03/21/2014 0819   ALKPHOS 42 04/14/2013 0916   AST 28 03/21/2014 0819   AST 23 04/14/2013 0916   ALT 42 03/21/2014 0819   ALT 24 04/14/2013 0916   BILITOT 0.77 03/21/2014 0819   BILITOT 0.4 04/14/2013 0916     ASSESSMENT AND PLAN: this is a very pleasant 63 years old African American female who was recently diagnosed with IgA multiple myeloma with significant elevation of IgA over 7000.  She is currently undergoing systemic chemotherapy with Carfilzomib, Cytoxan and dexamethasone and tolerating it fairly well.  she status post  4 cycles and she is currently undergoing cycle #5. She had significant improvement in her disease after cycle #4 and the patient was referred to Dr. Clydene Laming at [facility] for the autologous peripheral blood stem cell transplant. Dr. Clydene Laming ask me to give the patient 1 more cycle of her systemic chemotherapy because of his delay in the process of the transplant. She is tolerating cycle #5 fairly well with no significant adverse effects. I recommended for her to complete this cycle as scheduled. I would see her back for followup visit after completion of her transplant procedure but she was advised to call immediately she has any concerning symptoms in the interval. The patient voices understanding of current disease status and treatment options and is in agreement with the current care plan.  All questions were answered. The patient knows to call the clinic with any problems, questions or concerns. We can certainly see the patient much sooner if necessary.  Disclaimer: This note was dictated with voice recognition software. Similar sounding words can inadvertently be transcribed and  may not be corrected upon review.

## 2014-03-29 ENCOUNTER — Ambulatory Visit (HOSPITAL_BASED_OUTPATIENT_CLINIC_OR_DEPARTMENT_OTHER): Payer: 59

## 2014-03-29 VITALS — BP 145/77 | HR 95 | Temp 98.3°F

## 2014-03-29 DIAGNOSIS — C9000 Multiple myeloma not having achieved remission: Secondary | ICD-10-CM

## 2014-03-29 DIAGNOSIS — Z5112 Encounter for antineoplastic immunotherapy: Secondary | ICD-10-CM

## 2014-03-29 MED ORDER — CARFILZOMIB CHEMO INJECTION 60 MG
36.0000 mg/m2 | Freq: Once | INTRAVENOUS | Status: AC
  Administered 2014-03-29: 68 mg via INTRAVENOUS
  Filled 2014-03-29: qty 34

## 2014-03-29 MED ORDER — SODIUM CHLORIDE 0.9 % IV SOLN
Freq: Once | INTRAVENOUS | Status: AC
  Administered 2014-03-29: 08:00:00 via INTRAVENOUS

## 2014-03-29 MED ORDER — ONDANSETRON 8 MG/50ML IVPB (CHCC)
8.0000 mg | Freq: Once | INTRAVENOUS | Status: AC
  Administered 2014-03-29: 8 mg via INTRAVENOUS

## 2014-03-29 MED ORDER — SODIUM CHLORIDE 0.9 % IV SOLN: Freq: Once | INTRAVENOUS | Status: DC

## 2014-03-29 MED ORDER — DEXAMETHASONE SODIUM PHOSPHATE 10 MG/ML IJ SOLN
INTRAMUSCULAR | Status: AC
  Filled 2014-03-29: qty 1

## 2014-03-29 MED ORDER — ONDANSETRON 8 MG/NS 50 ML IVPB
INTRAVENOUS | Status: AC
  Filled 2014-03-29: qty 8

## 2014-03-29 MED ORDER — DEXAMETHASONE SODIUM PHOSPHATE 10 MG/ML IJ SOLN
10.0000 mg | Freq: Once | INTRAMUSCULAR | Status: AC
  Administered 2014-03-29: 10 mg via INTRAVENOUS

## 2014-03-29 NOTE — Patient Instructions (Signed)
[facility] Discharge Instructions for Patients Receiving Chemotherapy  Today you received the following chemotherapy agents kyprolis  To help prevent nausea and vomiting after your treatment, we encourage you to take your nausea medication as directed   If you develop nausea and vomiting that is not controlled by your nausea medication, call the clinic.   BELOW ARE SYMPTOMS THAT SHOULD BE REPORTED IMMEDIATELY:  *FEVER GREATER THAN 100.5 F  *CHILLS WITH OR WITHOUT FEVER  NAUSEA AND VOMITING THAT IS NOT CONTROLLED WITH YOUR NAUSEA MEDICATION  *UNUSUAL SHORTNESS OF BREATH  *UNUSUAL BRUISING OR BLEEDING  TENDERNESS IN MOUTH AND THROAT WITH OR WITHOUT PRESENCE OF ULCERS  *URINARY PROBLEMS  *BOWEL PROBLEMS  UNUSUAL RASH Items with * indicate a potential emergency and should be followed up as soon as possible.  Feel free to call the clinic you have any questions or concerns. The clinic phone number is [phone].  

## 2014-04-13 ENCOUNTER — Other Ambulatory Visit: Payer: Self-pay | Admitting: Internal Medicine

## 2014-04-16 ENCOUNTER — Other Ambulatory Visit: Payer: Self-pay | Admitting: Endocrinology

## 2014-04-25 ENCOUNTER — Encounter: Payer: Self-pay | Admitting: Endocrinology

## 2014-04-25 LAB — HM DIABETES EYE EXAM

## 2014-05-11 ENCOUNTER — Telehealth: Payer: Self-pay | Admitting: Medical Oncology

## 2014-05-11 DIAGNOSIS — C9000 Multiple myeloma not having achieved remission: Secondary | ICD-10-CM

## 2014-05-11 NOTE — Telephone Encounter (Signed)
[facility] notified of pending appt.

## 2014-05-11 NOTE — Telephone Encounter (Addendum)
[facility] requests f/u appt with [facility] in the next 2 weeks. Oncology Tx request sent

## 2014-05-13 ENCOUNTER — Other Ambulatory Visit: Payer: Self-pay | Admitting: Medical Oncology

## 2014-05-13 ENCOUNTER — Telehealth: Payer: Self-pay | Admitting: Medical Oncology

## 2014-05-13 DIAGNOSIS — C9000 Multiple myeloma not having achieved remission: Secondary | ICD-10-CM

## 2014-05-13 NOTE — Progress Notes (Signed)
Onc tx request sent  

## 2014-05-13 NOTE — Telephone Encounter (Signed)
Meds reconciled from [facility]. I called pt and reminded her about wearing a mask and to please call us Monday to verify her bactrim rx.

## 2014-05-16 ENCOUNTER — Telehealth: Payer: Self-pay | Admitting: *Deleted

## 2014-05-16 ENCOUNTER — Encounter: Payer: Self-pay | Admitting: *Deleted

## 2014-05-16 NOTE — Telephone Encounter (Signed)
Message copied by Britt Bottom on Mon May 16, 2014  1:52 PM ------      Message from: Ardeen Garland      Created: Fri May 13, 2014  4:28 PM       S/p transplant. Will you call her Monday about bactrim rx - when i reconciled [facility] meds it was not on on MAR ------

## 2014-05-16 NOTE — Telephone Encounter (Signed)
Called and spoke to pt, she states she is not on bactrim.  Per Dr Vista Mink, she should call [facility] if she is not on it.  Informed pt, she verbalized understanding.  SLJ

## 2014-05-18 ENCOUNTER — Telehealth: Payer: Self-pay | Admitting: Physician Assistant

## 2014-05-18 NOTE — Telephone Encounter (Signed)
Scheduled labs/ov per 08/14 POF, mailed AVS to pt.Marland Kitchen..KJ

## 2014-05-26 ENCOUNTER — Encounter: Payer: Self-pay | Admitting: Physician Assistant

## 2014-05-26 ENCOUNTER — Ambulatory Visit (HOSPITAL_BASED_OUTPATIENT_CLINIC_OR_DEPARTMENT_OTHER): Payer: 59 | Admitting: Physician Assistant

## 2014-05-26 ENCOUNTER — Other Ambulatory Visit (HOSPITAL_BASED_OUTPATIENT_CLINIC_OR_DEPARTMENT_OTHER): Payer: 59

## 2014-05-26 ENCOUNTER — Telehealth: Payer: Self-pay | Admitting: Internal Medicine

## 2014-05-26 ENCOUNTER — Telehealth: Payer: Self-pay

## 2014-05-26 VITALS — BP 90/67 | HR 114 | Temp 98.2°F | Resp 18 | Ht 66.0 in | Wt 149.7 lb

## 2014-05-26 DIAGNOSIS — Z9484 Stem cells transplant status: Secondary | ICD-10-CM

## 2014-05-26 DIAGNOSIS — C9000 Multiple myeloma not having achieved remission: Secondary | ICD-10-CM

## 2014-05-26 LAB — CBC WITH DIFFERENTIAL/PLATELET
BASO%: 4.8 % — AB (ref 0.0–2.0)
Basophils Absolute: 0.2 10*3/uL — ABNORMAL HIGH (ref 0.0–0.1)
EOS%: 9.5 % — ABNORMAL HIGH (ref 0.0–7.0)
Eosinophils Absolute: 0.4 10*3/uL (ref 0.0–0.5)
HCT: 37.4 % (ref 34.8–46.6)
HGB: 12.4 g/dL (ref 11.6–15.9)
LYMPH%: 40.6 % (ref 14.0–49.7)
MCH: 28.6 pg (ref 25.1–34.0)
MCHC: 33.2 g/dL (ref 31.5–36.0)
MCV: 86.4 fL (ref 79.5–101.0)
MONO#: 0.8 10*3/uL (ref 0.1–0.9)
MONO%: 17.5 % — AB (ref 0.0–14.0)
NEUT#: 1.2 10*3/uL — ABNORMAL LOW (ref 1.5–6.5)
NEUT%: 27.6 % — ABNORMAL LOW (ref 38.4–76.8)
NRBC: 0 % (ref 0–0)
Platelets: 161 10*3/uL (ref 145–400)
RBC: 4.33 10*6/uL (ref 3.70–5.45)
RDW: 15.4 % — ABNORMAL HIGH (ref 11.2–14.5)
WBC: 4.4 10*3/uL (ref 3.9–10.3)
lymph#: 1.8 10*3/uL (ref 0.9–3.3)

## 2014-05-26 LAB — COMPREHENSIVE METABOLIC PANEL (CC13)
ALT: 24 U/L (ref 0–55)
ANION GAP: 12 meq/L — AB (ref 3–11)
AST: 26 U/L (ref 5–34)
Albumin: 3.9 g/dL (ref 3.5–5.0)
Alkaline Phosphatase: 42 U/L (ref 40–150)
BILIRUBIN TOTAL: 0.63 mg/dL (ref 0.20–1.20)
BUN: 7.7 mg/dL (ref 7.0–26.0)
CO2: 20 meq/L — AB (ref 22–29)
CREATININE: 0.8 mg/dL (ref 0.6–1.1)
Calcium: 9.5 mg/dL (ref 8.4–10.4)
Chloride: 107 mEq/L (ref 98–109)
Glucose: 113 mg/dl (ref 70–140)
Potassium: 3.9 mEq/L (ref 3.5–5.1)
Sodium: 139 mEq/L (ref 136–145)
Total Protein: 6.1 g/dL — ABNORMAL LOW (ref 6.4–8.3)

## 2014-05-26 MED ORDER — ONDANSETRON HCL 8 MG PO TABS: 8.0000 mg | ORAL_TABLET | Freq: Three times a day (TID) | ORAL | Status: DC | PRN

## 2014-05-26 NOTE — Progress Notes (Signed)
Called and left VM with Barb in nutrtion, pt lost 23 pounds since 03/28/14 when pt had bone marrow transplant done.

## 2014-05-26 NOTE — Telephone Encounter (Signed)
Called [facility] about pt rx for Bactrim as they ordered for her post transplant. Pt has allergy to sulfa, [facility] called back and Spoke with Awilda Metro, PA. Pt to not get bactrim rx, has order for dopasone mon- fri until sept.   Called patient informed her we spoke with [facility] about Bactrim rx, patient is to not take this rx that the physican had ordered dapasone for pt to take mon-fri until September and if they have any questions to give [facility] a call. Pt verbalized understanding and denies any questions or concerns at this time.

## 2014-05-26 NOTE — Telephone Encounter (Signed)
gv adn printed appt sched and avs for pt fro Sept and OCT

## 2014-05-26 NOTE — Progress Notes (Addendum)
[name] Telephone:(336) [phone]   Fax:(336) [phone]  OFFICE PROGRESS NOTE  [name], MD 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 First dose on 11/15/2013. 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  INTERVAL HISTORY: Victoria Gates 63 y.o. female returns to the clinic today for follow up visit accompanied by her husband. She is status post autologous peripheral blood stem cell transplant at [facility] on April 27, 2014. She reports some nausea and vomiting a few days a week. She is taking Phenergan and Zofran ODT. The taste of the Zofran ODT tends to make her more nauseous. Her weight is down a little over 20 pounds. She has some fear of eating secondary to the nausea and vomiting. She requests a refill for the Zofran but would like the regular tablet. She tolerated the procedure fairly well. She denied having any significant fever or chills. She denied having any significant chest pain, shortness of breath, cough or hemoptysis. The patient denied having any  night sweats.   MEDICAL HISTORY: Past Medical History  Diagnosis Date  . COLONIC POLYPS, HX OF 04/24/2007  . OSTEOPOROSIS 04/24/2007  . GERD 09/21/2010  . DM 03/18/2008  . HYPERCHOLESTEROLEMIA 07/03/2009  . ANEMIA-IRON DEFICIENCY 04/24/2007  . ALLERGIC RHINITIS CAUSE UNSPECIFIED 01/02/2009  . ESOPHAGEAL STRICTURE 04/15/2007    s/p dilitation  . HIATAL HERNIA 04/15/2007  . CERVICAL RADICULOPATHY, LEFT 02/01/2010  . 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 Chest pain 03/2012    a. 04/15/2012 Ex MV: Ex time 10 mins, EF 63%, no ischemia/infarct.  Occas PAC's/PVC's.    ALLERGIES:  is allergic to atorvastatin; rosuvastatin; sulfamethoxazole; and sulfonamide derivatives.  MEDICATIONS:    Current Outpatient Prescriptions  Medication Sig Dispense Refill  . Ascorbic Acid (VITAMIN C) 1000 MG tablet Take 1,000 mg by mouth.      . Calcium 500-125 MG-UNIT TABS Take 1 tablet by mouth daily.        . cholecalciferol (VITAMIN D) 1000 UNITS tablet Take 1,000 Units by mouth daily.      Marland Kitchen docusate sodium (COLACE) 100 MG capsule Take 100 mg by mouth 2 (two) times daily.      . lansoprazole (PREVACID) 30 MG capsule Take 30 mg by mouth daily at 12 noon.      . loperamide (IMODIUM A-D) 2 MG tablet Take 2 mg by mouth.      . oxyCODONE (OXY IR/ROXICODONE) 5 MG immediate release tablet Take 5 mg by mouth every 4 (four) hours as needed. For pain      . potassium chloride SA (K-DUR,KLOR-CON) 20 MEQ tablet Take 20 mEq by mouth 2 (two) times daily.      . valACYclovir (VALTREX) 500 MG tablet Take 500 mg by mouth 2 (two) times daily.      Marland Kitchen acyclovir (ZOVIRAX) 400 MG tablet TAKE 1 TABLET (400 MG TOTAL) BY MOUTH 2 (TWO) TIMES DAILY.  60 tablet  4  . aspirin 81 MG tablet Take 81 mg by mouth daily.       . colestipol (COLESTID) 1 G tablet Take 1 g by mouth 2 (two) times daily.      . dapsone 100 MG tablet Take 100 mg by mouth daily. Take 1 tablet po daily Monday through friday      .  dexamethasone (DECADRON) 4 MG tablet 10 tablets by mouth every week starting with the first dose of chemotherapy  40 tablet  5  . ezetimibe (ZETIA) 10 MG tablet Take 10 mg by mouth every evening.      Marland Kitchen FeFum-FePoly-FA-B Cmp-C-Biot (INTEGRA PLUS) CAPS TAKE 1 CAPSULE BY MOUTH EVERY MORNING.  30 capsule  3  . fish oil-omega-3 fatty acids 1000 MG capsule Take 1 capsule by mouth daily.       . metFORMIN (GLUMETZA) 500 MG (MOD) 24 hr tablet Take 500 mg by mouth every evening.      Marland Kitchen OMEGA-3 FATTY ACIDS-VITAMIN E PO Take 1,000 mcg by mouth daily.      . ondansetron (ZOFRAN) 8 MG tablet Take 1 tablet (8 mg total) by mouth every 8 (eight) hours as needed for nausea or vomiting.  20 tablet  3  . prochlorperazine (COMPAZINE) 10 MG  tablet TAKE 1 TABLET (10 MG TOTAL) BY MOUTH EVERY 6 (SIX) HOURS AS NEEDED FOR NAUSEA OR VOMITING.  60 tablet  0  . promethazine (PHENERGAN) 12.5 MG tablet Take 12.5 mg by mouth every 6 (six) hours as needed.      Marland Kitchen ZETIA 10 MG tablet TAKE 1 TABLET BY MOUTH EVERY DAY  30 tablet  1   No current facility-administered medications for this visit.    SURGICAL HISTORY:  Past Surgical History  Procedure Laterality Date  . Tubal ligation    . Esophagogastroduodenoscopy  04/15/2007    REVIEW OF SYSTEMS:  Constitutional: positive for fatigue Eyes: negative Ears, nose, mouth, throat, and face: negative Respiratory: negative Cardiovascular: negative Gastrointestinal: negative Genitourinary:negative Integument/breast: negative Hematologic/lymphatic: negative Musculoskeletal:negative Neurological: negative Behavioral/Psych: negative Endocrine: negative Allergic/Immunologic: negative   PHYSICAL EXAMINATION: General appearance: alert, cooperative, fatigued and no distress Head: Normocephalic, without obvious abnormality, atraumatic Neck: no adenopathy, no JVD, supple, symmetrical, trachea midline and thyroid not enlarged, symmetric, no tenderness/mass/nodules Lymph nodes: Cervical, supraclavicular, and axillary nodes normal. Resp: clear to auscultation bilaterally Back: symmetric, no curvature. ROM normal. No CVA tenderness. Cardio: regular rate and rhythm, S1, S2 normal, no murmur, click, rub or gallop GI: soft, non-tender; bowel sounds normal; no masses,  no organomegaly Extremities: extremities normal, atraumatic, no cyanosis or edema Neurologic: Alert and oriented X 3, normal strength and tone. Normal symmetric reflexes. Normal coordination and gait  ECOG PERFORMANCE STATUS: 1 - Symptomatic but completely ambulatory  Blood pressure 90/67, pulse 114, temperature 98.2 F (36.8 C), temperature source Oral, resp. rate 18, height 5\' 6"  (1.676 m), weight 149 lb 11.2 oz (67.903  kg).  LABORATORY DATA: Lab Results  Component Value Date   WBC 4.4 05/26/2014   HGB 12.4 05/26/2014   HCT 37.4 05/26/2014   MCV 86.4 05/26/2014   PLT 161 05/26/2014      Chemistry      Component Value Date/Time   NA 139 05/26/2014 0829   NA 140 04/14/2013 0916   K 3.9 05/26/2014 0829   K 3.8 04/14/2013 0916   CL 105 04/14/2013 0916   CO2 20* 05/26/2014 0829   CO2 28 04/14/2013 0916   BUN 7.7 05/26/2014 0829   BUN 10 04/14/2013 0916   CREATININE 0.8 05/26/2014 0829   CREATININE 1.0 04/14/2013 0916      Component Value Date/Time   CALCIUM 9.5 05/26/2014 0829   CALCIUM 9.7 04/14/2013 0916   CALCIUM 9.0 12/18/2009 2256   ALKPHOS 42 05/26/2014 0829   ALKPHOS 42 04/14/2013 0916   AST 26 05/26/2014 0829   AST 23  04/14/2013 0916   ALT 24 05/26/2014 0829   ALT 24 04/14/2013 0916   BILITOT 0.63 05/26/2014 0829   BILITOT 0.4 04/14/2013 0916     ASSESSMENT AND PLAN: this is a very pleasant 64 years old African American female who was recently diagnosed with IgA multiple myeloma with significant elevation of IgA over 7000.  She is status post 5 cycles of systemic chemotherapy with Carfilzomib, Cytoxan and dexamethasone and tolerated it fairly well. 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 Clydene Laming at [facility].  The patient was discussed with and also seen by Dr. Julien Nordmann. We will arrange for weekly labs. She will follow up with Dr. Clydene Laming as scheduled. She will follow up with Dr. Julien Nordmann in about 6 weeks,shortly after her follow up with Dr. Clydene Laming. A prescription for Zofran 8 mg tablets was sent to her pharmacy of record via [place]. She is encouraged to increase he intake of food and fluids. She was advised to call immediately she has any concerning symptoms in the interval. The patient voices understanding of current disease status and treatment options and is in agreement with the current care plan.  All questions were answered. The patient  knows to call the clinic with any problems, questions or concerns. We can certainly see the patient much sooner if necessary.  Disclaimer: This note was dictated with voice recognition software. Similar sounding words can inadvertently be transcribed and may not be corrected upon review.  Carlton Adam PA-C  ADDENDUM: Hematology/Oncology Attending: I had a face to face encounter with the patient. I recommended for her care plan. This is a very pleasant 63 years old [place] American female with history of multiple myeloma status post 5 cycles of induction chemotherapy with Carfilzomib, Cytoxan and dexamethasone followed by autologous peripheral blood stem cell transplant at [facility] in July of 2015. The patient is almost a month from her transplant. She is feeling fine and her lab counts recovering slowly. She is here for evaluation and continues close monitoring with blood work. Advice the patient to continue her routine followup visit with the transplant team and will be happy to do the bloodwork hearing [facility] as needed. She would come back for followup visit in 6 weeks for reevaluation and repeat blood work. She was advised to call immediately if she has any concerning symptoms in the interval.  Disclaimer: This note was dictated with voice recognition software. Similar sounding words can inadvertently be transcribed and may be missed upon review. Eilleen Kempf., MD 05/29/2014

## 2014-05-28 ENCOUNTER — Encounter: Payer: Self-pay | Admitting: Gastroenterology

## 2014-05-28 NOTE — Patient Instructions (Signed)
Continue weekly labs as scheduled Follow up in 6 weeks

## 2014-06-01 ENCOUNTER — Ambulatory Visit: Payer: 59 | Admitting: Nutrition

## 2014-06-01 NOTE — Progress Notes (Signed)
63 year old female diagnosed with multiple myeloma status post autologous stem cell transplant, July 29.  She is a patient of Dr. Earlie Server.  Past medical history includes osteoporosis, colon polyps, GERD, diabetes, hypercholesterolemia, anemia, esophageal stricture, hiatal hernia, and chest pain.  Medications include vitamin C, calcium, vitamin D, Decadron, Colace, omega-3 fatty acids, Prevacid, Imodium, metformin, Zofran, Compazine, and Phenergan.  Labs were reviewed.  Height: 66 inches. Weight: 153 pounds on September 2. Usual body weight: 175 pounds. BMI: 24.69.  Patient reports she had nausea and vomiting after stem cell transplant.  This has resolved.  Patient is eating much better.  She is exercising and her weight has started to improve.  Patient continues to follow food Safety guidelines provided by [facility].  Nutrition diagnosis: Unintended weight loss related to diagnosis of multiple myeloma and associated treatments as evidenced by a 13% weight loss from usual body weight.  Intervention: Educated patient on increasing protein in small meals and snacks throughout the day.  Reviewed high protein foods.  Provided a fact sheet. Enforced food safety guidelines.  Recommended patient continue exercise with M.D. approval and strive for weight maintenance.  Monitoring, evaluation, goals: Patient will tolerate a plant-based diet to maintain present weight.  Next visit: Patient will contact me for questions or concerns.   **Disclaimer: This note was dictated with voice recognition software. Similar sounding words can inadvertently be transcribed and this note may contain transcription errors which may not have been corrected upon publication of note.**

## 2014-06-02 ENCOUNTER — Other Ambulatory Visit (HOSPITAL_BASED_OUTPATIENT_CLINIC_OR_DEPARTMENT_OTHER): Payer: 59

## 2014-06-02 DIAGNOSIS — C9000 Multiple myeloma not having achieved remission: Secondary | ICD-10-CM

## 2014-06-02 LAB — COMPREHENSIVE METABOLIC PANEL (CC13)
ALBUMIN: 3.7 g/dL (ref 3.5–5.0)
ALT: 42 U/L (ref 0–55)
AST: 35 U/L — ABNORMAL HIGH (ref 5–34)
Alkaline Phosphatase: 40 U/L (ref 40–150)
Anion Gap: 8 mEq/L (ref 3–11)
BUN: 5.7 mg/dL — AB (ref 7.0–26.0)
CALCIUM: 9.2 mg/dL (ref 8.4–10.4)
CHLORIDE: 110 meq/L — AB (ref 98–109)
CO2: 23 meq/L (ref 22–29)
CREATININE: 0.9 mg/dL (ref 0.6–1.1)
Glucose: 101 mg/dl (ref 70–140)
Potassium: 3.8 mEq/L (ref 3.5–5.1)
Sodium: 142 mEq/L (ref 136–145)
Total Bilirubin: 0.46 mg/dL (ref 0.20–1.20)
Total Protein: 5.9 g/dL — ABNORMAL LOW (ref 6.4–8.3)

## 2014-06-02 LAB — CBC WITH DIFFERENTIAL/PLATELET
BASO%: 2.8 % — ABNORMAL HIGH (ref 0.0–2.0)
BASOS ABS: 0.1 10*3/uL (ref 0.0–0.1)
EOS ABS: 0.4 10*3/uL (ref 0.0–0.5)
EOS%: 10.1 % — ABNORMAL HIGH (ref 0.0–7.0)
HEMATOCRIT: 35 % (ref 34.8–46.6)
HEMOGLOBIN: 11.2 g/dL — AB (ref 11.6–15.9)
LYMPH%: 36.6 % (ref 14.0–49.7)
MCH: 28.3 pg (ref 25.1–34.0)
MCHC: 32.1 g/dL (ref 31.5–36.0)
MCV: 88.4 fL (ref 79.5–101.0)
MONO#: 0.6 10*3/uL (ref 0.1–0.9)
MONO%: 16.8 % — ABNORMAL HIGH (ref 0.0–14.0)
NEUT%: 33.7 % — AB (ref 38.4–76.8)
NEUTROS ABS: 1.2 10*3/uL — AB (ref 1.5–6.5)
Platelets: 142 10*3/uL — ABNORMAL LOW (ref 145–400)
RBC: 3.96 10*6/uL (ref 3.70–5.45)
RDW: 16.7 % — ABNORMAL HIGH (ref 11.2–14.5)
WBC: 3.7 10*3/uL — AB (ref 3.9–10.3)
lymph#: 1.3 10*3/uL (ref 0.9–3.3)

## 2014-06-09 ENCOUNTER — Other Ambulatory Visit (HOSPITAL_BASED_OUTPATIENT_CLINIC_OR_DEPARTMENT_OTHER): Payer: 59

## 2014-06-09 DIAGNOSIS — C9000 Multiple myeloma not having achieved remission: Secondary | ICD-10-CM

## 2014-06-09 LAB — COMPREHENSIVE METABOLIC PANEL (CC13)
ALK PHOS: 36 U/L — AB (ref 40–150)
ALT: 33 U/L (ref 0–55)
AST: 31 U/L (ref 5–34)
Albumin: 3.6 g/dL (ref 3.5–5.0)
Anion Gap: 8 mEq/L (ref 3–11)
BILIRUBIN TOTAL: 0.45 mg/dL (ref 0.20–1.20)
BUN: 6 mg/dL — ABNORMAL LOW (ref 7.0–26.0)
CO2: 24 mEq/L (ref 22–29)
Calcium: 9.3 mg/dL (ref 8.4–10.4)
Chloride: 110 mEq/L — ABNORMAL HIGH (ref 98–109)
Creatinine: 0.8 mg/dL (ref 0.6–1.1)
Glucose: 99 mg/dl (ref 70–140)
Potassium: 3.8 mEq/L (ref 3.5–5.1)
SODIUM: 142 meq/L (ref 136–145)
Total Protein: 6.1 g/dL — ABNORMAL LOW (ref 6.4–8.3)

## 2014-06-09 LAB — CBC WITH DIFFERENTIAL/PLATELET
BASO%: 1.2 % (ref 0.0–2.0)
Basophils Absolute: 0.1 10*3/uL (ref 0.0–0.1)
EOS%: 5.7 % (ref 0.0–7.0)
Eosinophils Absolute: 0.2 10*3/uL (ref 0.0–0.5)
HCT: 33.5 % — ABNORMAL LOW (ref 34.8–46.6)
HGB: 11 g/dL — ABNORMAL LOW (ref 11.6–15.9)
LYMPH#: 1.7 10*3/uL (ref 0.9–3.3)
LYMPH%: 41.2 % (ref 14.0–49.7)
MCH: 28.9 pg (ref 25.1–34.0)
MCHC: 32.8 g/dL (ref 31.5–36.0)
MCV: 87.9 fL (ref 79.5–101.0)
MONO#: 0.5 10*3/uL (ref 0.1–0.9)
MONO%: 12.4 % (ref 0.0–14.0)
NEUT%: 39.5 % (ref 38.4–76.8)
NEUTROS ABS: 1.7 10*3/uL (ref 1.5–6.5)
NRBC: 0 % (ref 0–0)
Platelets: 139 10*3/uL — ABNORMAL LOW (ref 145–400)
RBC: 3.81 10*6/uL (ref 3.70–5.45)
RDW: 16.3 % — AB (ref 11.2–14.5)
WBC: 4.2 10*3/uL (ref 3.9–10.3)

## 2014-06-16 ENCOUNTER — Other Ambulatory Visit (HOSPITAL_BASED_OUTPATIENT_CLINIC_OR_DEPARTMENT_OTHER): Payer: 59

## 2014-06-16 DIAGNOSIS — C9000 Multiple myeloma not having achieved remission: Secondary | ICD-10-CM

## 2014-06-16 LAB — COMPREHENSIVE METABOLIC PANEL (CC13)
ALBUMIN: 3.3 g/dL — AB (ref 3.5–5.0)
ALK PHOS: 37 U/L — AB (ref 40–150)
ALT: 25 U/L (ref 0–55)
ANION GAP: 9 meq/L (ref 3–11)
AST: 27 U/L (ref 5–34)
BILIRUBIN TOTAL: 0.48 mg/dL (ref 0.20–1.20)
BUN: 4.9 mg/dL — AB (ref 7.0–26.0)
CO2: 22 mEq/L (ref 22–29)
Calcium: 9 mg/dL (ref 8.4–10.4)
Chloride: 112 mEq/L — ABNORMAL HIGH (ref 98–109)
Creatinine: 0.7 mg/dL (ref 0.6–1.1)
Glucose: 91 mg/dl (ref 70–140)
Potassium: 3.4 mEq/L — ABNORMAL LOW (ref 3.5–5.1)
SODIUM: 144 meq/L (ref 136–145)
TOTAL PROTEIN: 5.5 g/dL — AB (ref 6.4–8.3)

## 2014-06-16 LAB — CBC WITH DIFFERENTIAL/PLATELET
BASO%: 1.2 % (ref 0.0–2.0)
Basophils Absolute: 0 10*3/uL (ref 0.0–0.1)
EOS%: 3.8 % (ref 0.0–7.0)
Eosinophils Absolute: 0.1 10*3/uL (ref 0.0–0.5)
HEMATOCRIT: 31.8 % — AB (ref 34.8–46.6)
HGB: 10.3 g/dL — ABNORMAL LOW (ref 11.6–15.9)
LYMPH%: 35.4 % (ref 14.0–49.7)
MCH: 28.9 pg (ref 25.1–34.0)
MCHC: 32.3 g/dL (ref 31.5–36.0)
MCV: 89.4 fL (ref 79.5–101.0)
MONO#: 0.4 10*3/uL (ref 0.1–0.9)
MONO%: 13 % (ref 0.0–14.0)
NEUT#: 1.4 10*3/uL — ABNORMAL LOW (ref 1.5–6.5)
NEUT%: 46.6 % (ref 38.4–76.8)
PLATELETS: 140 10*3/uL — AB (ref 145–400)
RBC: 3.55 10*6/uL — AB (ref 3.70–5.45)
RDW: 17.4 % — ABNORMAL HIGH (ref 11.2–14.5)
WBC: 3.1 10*3/uL — ABNORMAL LOW (ref 3.9–10.3)
lymph#: 1.1 10*3/uL (ref 0.9–3.3)

## 2014-06-23 ENCOUNTER — Other Ambulatory Visit (HOSPITAL_BASED_OUTPATIENT_CLINIC_OR_DEPARTMENT_OTHER): Payer: 59

## 2014-06-23 DIAGNOSIS — C9000 Multiple myeloma not having achieved remission: Secondary | ICD-10-CM

## 2014-06-23 LAB — CBC WITH DIFFERENTIAL/PLATELET
BASO%: 1.2 % (ref 0.0–2.0)
Basophils Absolute: 0 10*3/uL (ref 0.0–0.1)
EOS ABS: 0.1 10*3/uL (ref 0.0–0.5)
EOS%: 3 % (ref 0.0–7.0)
HCT: 32.8 % — ABNORMAL LOW (ref 34.8–46.6)
HGB: 10.6 g/dL — ABNORMAL LOW (ref 11.6–15.9)
LYMPH%: 48.6 % (ref 14.0–49.7)
MCH: 28.7 pg (ref 25.1–34.0)
MCHC: 32.3 g/dL (ref 31.5–36.0)
MCV: 88.9 fL (ref 79.5–101.0)
MONO#: 0.4 10*3/uL (ref 0.1–0.9)
MONO%: 12 % (ref 0.0–14.0)
NEUT%: 35.2 % — ABNORMAL LOW (ref 38.4–76.8)
NEUTROS ABS: 1.3 10*3/uL — AB (ref 1.5–6.5)
PLATELETS: 173 10*3/uL (ref 145–400)
RBC: 3.69 10*6/uL — AB (ref 3.70–5.45)
RDW: 17 % — ABNORMAL HIGH (ref 11.2–14.5)
WBC: 3.7 10*3/uL — ABNORMAL LOW (ref 3.9–10.3)
lymph#: 1.8 10*3/uL (ref 0.9–3.3)

## 2014-06-23 LAB — COMPREHENSIVE METABOLIC PANEL (CC13)
ALBUMIN: 3.7 g/dL (ref 3.5–5.0)
ALT: 11 U/L (ref 0–55)
ANION GAP: 10 meq/L (ref 3–11)
AST: 17 U/L (ref 5–34)
Alkaline Phosphatase: 34 U/L — ABNORMAL LOW (ref 40–150)
BUN: 8.3 mg/dL (ref 7.0–26.0)
CALCIUM: 9.6 mg/dL (ref 8.4–10.4)
CO2: 22 meq/L (ref 22–29)
Chloride: 110 mEq/L — ABNORMAL HIGH (ref 98–109)
Creatinine: 0.8 mg/dL (ref 0.6–1.1)
GLUCOSE: 90 mg/dL (ref 70–140)
POTASSIUM: 3.5 meq/L (ref 3.5–5.1)
Sodium: 142 mEq/L (ref 136–145)
TOTAL PROTEIN: 6.1 g/dL — AB (ref 6.4–8.3)
Total Bilirubin: 0.44 mg/dL (ref 0.20–1.20)

## 2014-06-23 LAB — LACTATE DEHYDROGENASE (CC13): LDH: 189 U/L (ref 125–245)

## 2014-06-30 ENCOUNTER — Other Ambulatory Visit (HOSPITAL_BASED_OUTPATIENT_CLINIC_OR_DEPARTMENT_OTHER): Payer: 59

## 2014-06-30 DIAGNOSIS — C9000 Multiple myeloma not having achieved remission: Secondary | ICD-10-CM

## 2014-06-30 LAB — CBC WITH DIFFERENTIAL/PLATELET
BASO%: 0.8 % (ref 0.0–2.0)
BASOS ABS: 0 10*3/uL (ref 0.0–0.1)
EOS%: 3.8 % (ref 0.0–7.0)
Eosinophils Absolute: 0.2 10*3/uL (ref 0.0–0.5)
HCT: 32.8 % — ABNORMAL LOW (ref 34.8–46.6)
HEMOGLOBIN: 10.8 g/dL — AB (ref 11.6–15.9)
LYMPH%: 29.4 % (ref 14.0–49.7)
MCH: 28.6 pg (ref 25.1–34.0)
MCHC: 32.9 g/dL (ref 31.5–36.0)
MCV: 87 fL (ref 79.5–101.0)
MONO#: 0.5 10*3/uL (ref 0.1–0.9)
MONO%: 12.8 % (ref 0.0–14.0)
NEUT#: 2.1 10*3/uL (ref 1.5–6.5)
NEUT%: 53.2 % (ref 38.4–76.8)
Platelets: 172 10*3/uL (ref 145–400)
RBC: 3.77 10*6/uL (ref 3.70–5.45)
RDW: 15.6 % — AB (ref 11.2–14.5)
WBC: 4 10*3/uL (ref 3.9–10.3)
lymph#: 1.2 10*3/uL (ref 0.9–3.3)
nRBC: 0 % (ref 0–0)

## 2014-06-30 LAB — COMPREHENSIVE METABOLIC PANEL (CC13)
ALT: 13 U/L (ref 0–55)
AST: 16 U/L (ref 5–34)
Albumin: 3.7 g/dL (ref 3.5–5.0)
Alkaline Phosphatase: 34 U/L — ABNORMAL LOW (ref 40–150)
Anion Gap: 8 mEq/L (ref 3–11)
BILIRUBIN TOTAL: 0.39 mg/dL (ref 0.20–1.20)
BUN: 11.4 mg/dL (ref 7.0–26.0)
CO2: 24 mEq/L (ref 22–29)
Calcium: 9.7 mg/dL (ref 8.4–10.4)
Chloride: 110 mEq/L — ABNORMAL HIGH (ref 98–109)
Creatinine: 0.9 mg/dL (ref 0.6–1.1)
GLUCOSE: 90 mg/dL (ref 70–140)
Potassium: 3.8 mEq/L (ref 3.5–5.1)
SODIUM: 142 meq/L (ref 136–145)
Total Protein: 6.1 g/dL — ABNORMAL LOW (ref 6.4–8.3)

## 2014-07-07 ENCOUNTER — Encounter: Payer: Self-pay | Admitting: Internal Medicine

## 2014-07-07 ENCOUNTER — Ambulatory Visit (HOSPITAL_BASED_OUTPATIENT_CLINIC_OR_DEPARTMENT_OTHER): Payer: 59 | Admitting: Internal Medicine

## 2014-07-07 ENCOUNTER — Other Ambulatory Visit (HOSPITAL_BASED_OUTPATIENT_CLINIC_OR_DEPARTMENT_OTHER): Payer: 59

## 2014-07-07 ENCOUNTER — Telehealth: Payer: Self-pay | Admitting: Internal Medicine

## 2014-07-07 VITALS — BP 109/73 | HR 102 | Temp 98.6°F | Resp 18 | Ht 66.0 in | Wt 155.1 lb

## 2014-07-07 DIAGNOSIS — C9000 Multiple myeloma not having achieved remission: Secondary | ICD-10-CM

## 2014-07-07 DIAGNOSIS — D649 Anemia, unspecified: Secondary | ICD-10-CM

## 2014-07-07 LAB — COMPREHENSIVE METABOLIC PANEL (CC13)
ALBUMIN: 3.8 g/dL (ref 3.5–5.0)
ALK PHOS: 37 U/L — AB (ref 40–150)
ALT: 14 U/L (ref 0–55)
AST: 15 U/L (ref 5–34)
Anion Gap: 8 mEq/L (ref 3–11)
BUN: 8 mg/dL (ref 7.0–26.0)
CO2: 23 mEq/L (ref 22–29)
Calcium: 9.8 mg/dL (ref 8.4–10.4)
Chloride: 112 mEq/L — ABNORMAL HIGH (ref 98–109)
Creatinine: 0.8 mg/dL (ref 0.6–1.1)
GLUCOSE: 86 mg/dL (ref 70–140)
POTASSIUM: 3.6 meq/L (ref 3.5–5.1)
SODIUM: 143 meq/L (ref 136–145)
TOTAL PROTEIN: 6.3 g/dL — AB (ref 6.4–8.3)
Total Bilirubin: 0.37 mg/dL (ref 0.20–1.20)

## 2014-07-07 LAB — CBC WITH DIFFERENTIAL/PLATELET
BASO%: 1.6 % (ref 0.0–2.0)
Basophils Absolute: 0.1 10*3/uL (ref 0.0–0.1)
EOS ABS: 0.2 10*3/uL (ref 0.0–0.5)
EOS%: 5.9 % (ref 0.0–7.0)
HCT: 33.6 % — ABNORMAL LOW (ref 34.8–46.6)
HGB: 10.8 g/dL — ABNORMAL LOW (ref 11.6–15.9)
LYMPH%: 34.9 % (ref 14.0–49.7)
MCH: 28.2 pg (ref 25.1–34.0)
MCHC: 32.1 g/dL (ref 31.5–36.0)
MCV: 87.9 fL (ref 79.5–101.0)
MONO#: 0.5 10*3/uL (ref 0.1–0.9)
MONO%: 12.2 % (ref 0.0–14.0)
NEUT%: 45.4 % (ref 38.4–76.8)
NEUTROS ABS: 1.8 10*3/uL (ref 1.5–6.5)
PLATELETS: 187 10*3/uL (ref 145–400)
RBC: 3.82 10*6/uL (ref 3.70–5.45)
RDW: 16.4 % — ABNORMAL HIGH (ref 11.2–14.5)
WBC: 4 10*3/uL (ref 3.9–10.3)
lymph#: 1.4 10*3/uL (ref 0.9–3.3)

## 2014-07-07 NOTE — Progress Notes (Signed)
[place] [phone]   [phone]  OFFICE PROGRESS NOTE  [name], MD 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 First dose on 11/15/2013. Status post 5 cycles. 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   CURRENT THERAPY: Observation.  INTERVAL HISTORY: Victoria Gates 63 y.o. female returns to the clinic today for follow up visit accompanied by her husband. The patient is feeling fine today with no specific complaints. She is more than 2 months since her autologous peripheral blood stem transplant at [facility]. She denied having any significant fever or chills. She denied having any significant chest pain, shortness of breath, cough or hemoptysis. The patient denied having any significant weight loss or night sweats. She has no nausea or vomiting. She had repeat CBC and comprehensive metabolic panel performed earlier today and she is here for evaluation and discussion of her lab results.  MEDICAL HISTORY: Past Medical History  Diagnosis Date  . COLONIC POLYPS, HX OF 04/24/2007  . OSTEOPOROSIS 04/24/2007  . GERD 09/21/2010  . DM 03/18/2008  . HYPERCHOLESTEROLEMIA 07/03/2009  . ANEMIA-IRON DEFICIENCY 04/24/2007  . ALLERGIC RHINITIS CAUSE UNSPECIFIED 01/02/2009  . ESOPHAGEAL STRICTURE 04/15/2007    s/p dilitation  . HIATAL HERNIA 04/15/2007  . CERVICAL RADICULOPATHY, LEFT 02/01/2010  . 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 Chest pain 03/2012    a. 04/15/2012 Ex MV: Ex time 10 mins, EF 63%, no ischemia/infarct.  Occas PAC's/PVC's.    ALLERGIES:  is allergic to atorvastatin; rosuvastatin; sulfamethoxazole; and sulfonamide derivatives.  MEDICATIONS:  Current Outpatient Prescriptions  Medication Sig Dispense Refill  . Ascorbic Acid (VITAMIN C) 1000  MG tablet Take 1,000 mg by mouth.      . Calcium 500-125 MG-UNIT TABS Take 1 tablet by mouth daily.        . cholecalciferol (VITAMIN D) 1000 UNITS tablet Take 1,000 Units by mouth daily.      . lansoprazole (PREVACID) 30 MG capsule Take 30 mg by mouth daily at 12 noon.      . potassium chloride SA (K-DUR,KLOR-CON) 20 MEQ tablet Take 20 mEq by mouth 2 (two) times daily.      . valACYclovir (VALTREX) 500 MG tablet Take 500 mg by mouth 2 (two) times daily.       No current facility-administered medications for this visit.    SURGICAL HISTORY:  Past Surgical History  Procedure Laterality Date  . Tubal ligation    . Esophagogastroduodenoscopy  04/15/2007    REVIEW OF SYSTEMS:  Constitutional: positive for fatigue Eyes: negative Ears, nose, mouth, throat, and face: negative Respiratory: negative Cardiovascular: negative Gastrointestinal: negative Genitourinary:negative Integument/breast: negative Hematologic/lymphatic: negative Musculoskeletal:negative Neurological: negative Behavioral/Psych: negative Endocrine: negative Allergic/Immunologic: negative   PHYSICAL EXAMINATION: General appearance: alert, cooperative, fatigued and no distress Head: Normocephalic, without obvious abnormality, atraumatic Neck: no adenopathy, no JVD, supple, symmetrical, trachea midline and thyroid not enlarged, symmetric, no tenderness/mass/nodules Lymph nodes: Cervical, supraclavicular, and axillary nodes normal. Resp: clear to auscultation bilaterally Back: symmetric, no curvature. ROM normal. No CVA tenderness. Cardio: regular rate and rhythm, S1, S2 normal, no murmur, click, rub or gallop GI: soft, non-tender; bowel sounds normal; no masses,  no organomegaly Extremities: extremities normal, atraumatic, no cyanosis or edema Neurologic: Alert and oriented X 3, normal strength and tone. Normal symmetric reflexes. Normal  coordination and gait  ECOG PERFORMANCE STATUS: 1 - Symptomatic but completely  ambulatory  Blood pressure 109/73, pulse 102, temperature 98.6 F (37 C), temperature source Oral, resp. rate 18, height $RemoveBe'5\' 6"'agzVZdrzH$  (1.676 m), weight 155 lb 1.6 oz (70.353 kg), SpO2 100.00%.  LABORATORY DATA: Lab Results  Component Value Date   WBC 4.0 07/07/2014   HGB 10.8* 07/07/2014   HCT 33.6* 07/07/2014   MCV 87.9 07/07/2014   PLT 187 07/07/2014      Chemistry      Component Value Date/Time   NA 143 07/07/2014 0803   NA 140 04/14/2013 0916   K 3.6 07/07/2014 0803   K 3.8 04/14/2013 0916   CL 105 04/14/2013 0916   CO2 23 07/07/2014 0803   CO2 28 04/14/2013 0916   BUN 8.0 07/07/2014 0803   BUN 10 04/14/2013 0916   CREATININE 0.8 07/07/2014 0803   CREATININE 1.0 04/14/2013 0916      Component Value Date/Time   CALCIUM 9.8 07/07/2014 0803   CALCIUM 9.7 04/14/2013 0916   CALCIUM 9.0 12/18/2009 2256   ALKPHOS 37* 07/07/2014 0803   ALKPHOS 42 04/14/2013 0916   AST 15 07/07/2014 0803   AST 23 04/14/2013 0916   ALT 14 07/07/2014 0803   ALT 24 04/14/2013 0916   BILITOT 0.37 07/07/2014 0803   BILITOT 0.4 04/14/2013 0916     ASSESSMENT AND PLAN: This is a very pleasant 63 years old [place] American female who was recently diagnosed with IgA multiple myeloma with significant elevation of IgA over 7000.  She is currently undergoing systemic chemotherapy with Carfilzomib, Cytoxan and dexamethasone and tolerating it fairly well.  she status post 5 cycles followed by autologous peripheral blood stem cell transplant in July of 2015. The patient is feeling fine today with no specific complaints. Her blood work is unremarkable except for mild anemia. I would see her back for followup visit in one month's for reevaluation with repeat blood work. At that time I will discuss with the patient the option of maintenance treatment with single agent Revlimid. She was advised to call immediately she has any concerning symptoms in the interval. The patient voices understanding of current disease status and treatment  options and is in agreement with the current care plan.  All questions were answered. The patient knows to call the clinic with any problems, questions or concerns. We can certainly see the patient much sooner if necessary.  Disclaimer: This note was dictated with voice recognition software. Similar sounding words can inadvertently be transcribed and may not be corrected upon review.

## 2014-07-07 NOTE — Telephone Encounter (Signed)
gv and printed appt sched and avs for pt for NOV °

## 2014-08-04 ENCOUNTER — Encounter: Payer: Self-pay | Admitting: Internal Medicine

## 2014-08-04 ENCOUNTER — Telehealth: Payer: Self-pay | Admitting: Internal Medicine

## 2014-08-04 ENCOUNTER — Other Ambulatory Visit (HOSPITAL_BASED_OUTPATIENT_CLINIC_OR_DEPARTMENT_OTHER): Payer: 59

## 2014-08-04 ENCOUNTER — Ambulatory Visit (HOSPITAL_BASED_OUTPATIENT_CLINIC_OR_DEPARTMENT_OTHER): Payer: 59 | Admitting: Internal Medicine

## 2014-08-04 VITALS — BP 105/70 | HR 77 | Temp 98.3°F | Resp 18 | Ht 66.0 in | Wt 156.3 lb

## 2014-08-04 DIAGNOSIS — C9000 Multiple myeloma not having achieved remission: Secondary | ICD-10-CM

## 2014-08-04 DIAGNOSIS — D649 Anemia, unspecified: Secondary | ICD-10-CM

## 2014-08-04 LAB — COMPREHENSIVE METABOLIC PANEL (CC13)
ALK PHOS: 43 U/L (ref 40–150)
ALT: 19 U/L (ref 0–55)
AST: 22 U/L (ref 5–34)
Albumin: 4 g/dL (ref 3.5–5.0)
Anion Gap: 9 mEq/L (ref 3–11)
BILIRUBIN TOTAL: 0.37 mg/dL (ref 0.20–1.20)
BUN: 12.8 mg/dL (ref 7.0–26.0)
CO2: 23 mEq/L (ref 22–29)
Calcium: 9.9 mg/dL (ref 8.4–10.4)
Chloride: 110 mEq/L — ABNORMAL HIGH (ref 98–109)
Creatinine: 1 mg/dL (ref 0.6–1.1)
Glucose: 102 mg/dl (ref 70–140)
Potassium: 4.4 mEq/L (ref 3.5–5.1)
SODIUM: 143 meq/L (ref 136–145)
TOTAL PROTEIN: 6.5 g/dL (ref 6.4–8.3)

## 2014-08-04 LAB — CBC WITH DIFFERENTIAL/PLATELET
BASO%: 1.3 % (ref 0.0–2.0)
Basophils Absolute: 0.1 10*3/uL (ref 0.0–0.1)
EOS%: 2.9 % (ref 0.0–7.0)
Eosinophils Absolute: 0.1 10*3/uL (ref 0.0–0.5)
HCT: 35.2 % (ref 34.8–46.6)
HGB: 11.5 g/dL — ABNORMAL LOW (ref 11.6–15.9)
LYMPH%: 39.4 % (ref 14.0–49.7)
MCH: 28.1 pg (ref 25.1–34.0)
MCHC: 32.6 g/dL (ref 31.5–36.0)
MCV: 86.2 fL (ref 79.5–101.0)
MONO#: 0.5 10*3/uL (ref 0.1–0.9)
MONO%: 11.2 % (ref 0.0–14.0)
NEUT#: 2.1 10*3/uL (ref 1.5–6.5)
NEUT%: 45.2 % (ref 38.4–76.8)
Platelets: 187 10*3/uL (ref 145–400)
RBC: 4.08 10*6/uL (ref 3.70–5.45)
RDW: 16 % — ABNORMAL HIGH (ref 11.2–14.5)
WBC: 4.6 10*3/uL (ref 3.9–10.3)
lymph#: 1.8 10*3/uL (ref 0.9–3.3)

## 2014-08-04 NOTE — Progress Notes (Signed)
[place] [phone]   Fax:(336) [phone]  OFFICE PROGRESS NOTE  [name], MD 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 First dose on 11/15/2013. Status post 5 cycles. 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   CURRENT THERAPY: Observation.  INTERVAL HISTORY: Victoria Gates 63 y.o. female returns to the clinic today for follow up visit accompanied by her husband. The patient is feeling fine today with no specific complaints. She denied having any significant fever or chills. She denied having any significant chest pain, shortness of breath, cough or hemoptysis. The patient denied having any significant weight loss or night sweats. She has no nausea or vomiting. She had repeat CBC and comprehensive metabolic panel performed earlier today and she is here for evaluation and discussion of her lab results.  MEDICAL HISTORY: Past Medical History  Diagnosis Date  . COLONIC POLYPS, HX OF 04/24/2007  . OSTEOPOROSIS 04/24/2007  . GERD 09/21/2010  . DM 03/18/2008  . HYPERCHOLESTEROLEMIA 07/03/2009  . ANEMIA-IRON DEFICIENCY 04/24/2007  . ALLERGIC RHINITIS CAUSE UNSPECIFIED 01/02/2009  . ESOPHAGEAL STRICTURE 04/15/2007    s/p dilitation  . HIATAL HERNIA 04/15/2007  . CERVICAL RADICULOPATHY, LEFT 02/01/2010  . 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 Chest pain 03/2012    a. 04/15/2012 Ex MV: Ex time 10 mins, EF 63%, no ischemia/infarct.  Occas PAC's/PVC's.    ALLERGIES:  is allergic to atorvastatin; rosuvastatin; sulfamethoxazole; and sulfonamide derivatives.  MEDICATIONS:  Current Outpatient Prescriptions  Medication Sig Dispense Refill  . Ascorbic Acid (VITAMIN C) 1000 MG tablet Take 1,000 mg by mouth.    . Calcium 500-125 MG-UNIT TABS Take 1 tablet by mouth daily.       . cholecalciferol (VITAMIN D) 1000 UNITS tablet Take 1,000 Units by mouth daily.    . lansoprazole (PREVACID) 30 MG capsule Take 30 mg by mouth daily at 12 noon.    . potassium chloride SA (K-DUR,KLOR-CON) 20 MEQ tablet Take 20 mEq by mouth 2 (two) times daily.    . valACYclovir (VALTREX) 500 MG tablet Take 500 mg by mouth 2 (two) times daily.     No current facility-administered medications for this visit.    SURGICAL HISTORY:  Past Surgical History  Procedure Laterality Date  . Tubal ligation    . Esophagogastroduodenoscopy  04/15/2007    REVIEW OF SYSTEMS:  Constitutional: positive for fatigue Eyes: negative Ears, nose, mouth, throat, and face: negative Respiratory: negative Cardiovascular: negative Gastrointestinal: negative Genitourinary:negative Integument/breast: negative Hematologic/lymphatic: negative Musculoskeletal:negative Neurological: negative Behavioral/Psych: negative Endocrine: negative Allergic/Immunologic: negative   PHYSICAL EXAMINATION: General appearance: alert, cooperative, fatigued and no distress Head: Normocephalic, without obvious abnormality, atraumatic Neck: no adenopathy, no JVD, supple, symmetrical, trachea midline and thyroid not enlarged, symmetric, no tenderness/mass/nodules Lymph nodes: Cervical, supraclavicular, and axillary nodes normal. Resp: clear to auscultation bilaterally Back: symmetric, no curvature. ROM normal. No CVA tenderness. Cardio: regular rate and rhythm, S1, S2 normal, no murmur, click, rub or gallop GI: soft, non-tender; bowel sounds normal; no masses,  no organomegaly Extremities: extremities normal, atraumatic, no cyanosis or edema Neurologic: Alert and oriented X 3, normal strength and tone. Normal symmetric reflexes. Normal coordination and gait  ECOG PERFORMANCE STATUS: 1 - Symptomatic but completely ambulatory  Blood pressure 105/70, pulse 77, temperature 98.3 F (36.8 C), temperature source Oral, resp. rate  18, height $RemoveBe'5\' 6"'NavHuUibg$  (1.676 m), weight 156 lb 4.8 oz (70.897 kg), SpO2 100 %.  LABORATORY DATA: Lab Results  Component Value Date   WBC 4.6 08/04/2014   HGB 11.5* 08/04/2014   HCT 35.2 08/04/2014   MCV 86.2 08/04/2014   PLT 187 08/04/2014      Chemistry      Component Value Date/Time   NA 143 08/04/2014 0814   NA 140 04/14/2013 0916   K 4.4 08/04/2014 0814   K 3.8 04/14/2013 0916   CL 105 04/14/2013 0916   CO2 23 08/04/2014 0814   CO2 28 04/14/2013 0916   BUN 12.8 08/04/2014 0814   BUN 10 04/14/2013 0916   CREATININE 1.0 08/04/2014 0814   CREATININE 1.0 04/14/2013 0916      Component Value Date/Time   CALCIUM 9.9 08/04/2014 0814   CALCIUM 9.7 04/14/2013 0916   CALCIUM 9.0 12/18/2009 2256   ALKPHOS 43 08/04/2014 0814   ALKPHOS 42 04/14/2013 0916   AST 22 08/04/2014 0814   AST 23 04/14/2013 0916   ALT 19 08/04/2014 0814   ALT 24 04/14/2013 0916   BILITOT 0.37 08/04/2014 0814   BILITOT 0.4 04/14/2013 0916     ASSESSMENT AND PLAN: This is a very pleasant 63 years old [place] American female who was recently diagnosed with IgA multiple myeloma with significant elevation of IgA over 7000.  She is currently undergoing systemic chemotherapy with Carfilzomib, Cytoxan and dexamethasone and tolerating it fairly well.  she status post 5 cycles followed by autologous peripheral blood stem cell transplant in July of 2015. The patient is feeling fine today with no specific complaints. Her blood work is unremarkable except for mild anemia. I discussed the lab result with the patient and her husband today. We also discussed the option of maintenance treatment with Revlimid but they would like to hold on this option for now. I would see her back for follow-up visit in 2 months with repeat myeloma panel for evaluation of her disease. She was advised to call immediately she has any concerning symptoms in the interval. The patient voices understanding of current disease status and treatment  options and is in agreement with the current care plan.  All questions were answered. The patient knows to call the clinic with any problems, questions or concerns. We can certainly see the patient much sooner if necessary.  Disclaimer: This note was dictated with voice recognition software. Similar sounding words can inadvertently be transcribed and may not be corrected upon review.

## 2014-08-04 NOTE — Telephone Encounter (Signed)
gv adn printed appt sched and avs for pt for Jan 2016 °

## 2014-08-06 ENCOUNTER — Other Ambulatory Visit: Payer: Self-pay | Admitting: Gastroenterology

## 2014-08-09 ENCOUNTER — Other Ambulatory Visit: Payer: Self-pay | Admitting: Medical Oncology

## 2014-08-09 ENCOUNTER — Telehealth: Payer: Self-pay | Admitting: Gastroenterology

## 2014-08-09 ENCOUNTER — Other Ambulatory Visit: Payer: Self-pay | Admitting: Gastroenterology

## 2014-08-09 NOTE — Telephone Encounter (Signed)
Yes, that is ok 

## 2014-08-09 NOTE — Telephone Encounter (Signed)
Dr. Ardis Hughs,   Patient scheduled a follow up visit with you for 09-02-2014 Request for Lansoprazole  Is it okay to refill?

## 2014-08-10 MED ORDER — LANSOPRAZOLE 30 MG PO CPDR: 30.0000 mg | DELAYED_RELEASE_CAPSULE | Freq: Every day | ORAL | Status: DC

## 2014-08-10 NOTE — Addendum Note (Signed)
Addended by: Hope Pigeon A on: 08/10/2014 08:47 AM   Modules accepted: Orders

## 2014-09-02 ENCOUNTER — Ambulatory Visit (INDEPENDENT_AMBULATORY_CARE_PROVIDER_SITE_OTHER): Payer: 59 | Admitting: Gastroenterology

## 2014-09-02 ENCOUNTER — Encounter: Payer: Self-pay | Admitting: Gastroenterology

## 2014-09-02 VITALS — BP 108/70 | HR 74 | Ht 66.0 in | Wt 159.8 lb

## 2014-09-02 DIAGNOSIS — K219 Gastro-esophageal reflux disease without esophagitis: Secondary | ICD-10-CM

## 2014-09-02 NOTE — Progress Notes (Signed)
Review of pertinent gastrointestinal problems: 1. Routine risk for colon cancer: Colonoscopy 2003 Dr. Sharlett Iles; HP polyps only; Colonoscopy Dr. Sharlett Iles 2008, HP polyp only; colonoscopy 05/2012 Dr. Sharlett Iles normal, he recommended recall colonoscopy at 10 year. 2. Dysphagia; EGD Dr. Sharlett Iles noted 27m peptic stricture, dilated to 579Fr, started on prevacid daily.  HPI: This is a  very pleasant 62year old woman who is here with her husband today. This is the first time I am meeting her. She is previously a patient of Dr. DShanon BrowPatterson's. She takes Prevacid 30 mg daily about 2-3 hours before her breakfast meal. On this regimen she feels very well. This costs her $140 out of pocket per month. She has no dysphasia. Her weight is overall stable.  Currently battling multiple myeloma, status post autologous stem cell transplant this past summer.    Past Medical History  Diagnosis Date  . COLONIC POLYPS, HX OF 04/24/2007    hyperplastic only  . OSTEOPOROSIS 04/24/2007  . GERD 09/21/2010  . DM 03/18/2008  . HYPERCHOLESTEROLEMIA 07/03/2009  . ANEMIA-IRON DEFICIENCY 04/24/2007  . ALLERGIC RHINITIS CAUSE UNSPECIFIED 01/02/2009  . ESOPHAGEAL STRICTURE 04/15/2007    s/p dilitation  . HIATAL HERNIA 04/15/2007  . CERVICAL RADICULOPATHY, LEFT 02/01/2010  . 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 Chest pain 03/2012    a. 04/15/2012 Ex MV: Ex time 10 mins, EF 63%, no ischemia/infarct.  Occas PAC's/PVC's.    Past Surgical History  Procedure Laterality Date  . Tubal ligation    . Esophagogastroduodenoscopy  04/15/2007    Current Outpatient Prescriptions  Medication Sig Dispense Refill  . Ascorbic Acid (VITAMIN C) 1000 MG tablet Take 1,000 mg by mouth.    . Calcium 500-125 MG-UNIT TABS Take 1 tablet by mouth daily.      . cholecalciferol (VITAMIN D) 1000 UNITS tablet Take 1,000 Units by mouth daily.    . lansoprazole (PREVACID) 30 MG capsule Take 1 capsule (30 mg total) by mouth daily. 30 capsule 1  . potassium  chloride SA (K-DUR,KLOR-CON) 20 MEQ tablet Take 20 mEq by mouth 2 (two) times daily.    . valACYclovir (VALTREX) 500 MG tablet Take 500 mg by mouth 2 (two) times daily.     No current facility-administered medications for this visit.    Allergies as of 09/02/2014 - Review Complete 09/02/2014  Allergen Reaction Noted  . Atorvastatin  06/05/2010  . Rosuvastatin  06/05/2010  . Sulfamethoxazole  12/23/2006  . Sulfonamide derivatives      Family History  Problem Relation Age of Onset  . Cancer Neg Hx     No FH of Colon Cancer  . Colon cancer Neg Hx   . Esophageal cancer Neg Hx   . Rectal cancer Neg Hx   . Stomach cancer Neg Hx   . Brain cancer Mother   . Heart disease Mother   . Lung cancer Father   . Heart disease Father   . Diabetes Sister   . Heart disease Sister   . Diabetes Brother   . Heart disease Brother     History   Social History  . Marital Status: Married    Spouse Name: N/A    Number of Children: N/A  . Years of Education: N/A   Occupational History  . MAIL CLERK    Social History Main Topics  . Smoking status: Never Smoker   . Smokeless tobacco: Never Used  . Alcohol Use: No  . Drug Use: No  . Sexual Activity: Not on file  Other Topics Concern  . Not on file   Social History Narrative      Physical Exam: BP 108/70 mmHg  Pulse 74  Ht _0  (1.676 m)  Wt 159 lb 12.8 oz (72.485 kg)  BMI 25.80 kg/m2 Constitutional: generally well-appearing Psychiatric: alert and oriented x3 Abdomen: soft, nontender, nondistended, no obvious ascites, no peritoneal signs, normal bowel sounds     Assessment and plan: 63 y.o. female with chronic GERD  Her GERD symptoms are well controlled on proton pump inhibitor once daily. She is paying incredible amount of out-of-pocket I recommended she try the over-the-counter variety of Prevacid at 15 mg strength. If this works and that is all she needs one pill once daily shortly before breakfast meal. If that is not  strong enough that she will take 2 pills once daily shortly before breakfast meal. This ought to save her $90-$100 per month. She'll return to see me on an as-needed basis.

## 2014-09-02 NOTE — Patient Instructions (Addendum)
Purchase OTC Prevacid use 15mg  every day 30 minutes before breakfast if that is not effective ok to take two 30 minutes before breakfast

## 2014-09-08 ENCOUNTER — Encounter: Payer: Self-pay | Admitting: *Deleted

## 2014-09-08 ENCOUNTER — Other Ambulatory Visit: Payer: Self-pay | Admitting: Nurse Practitioner

## 2014-09-08 ENCOUNTER — Telehealth: Payer: Self-pay | Admitting: Internal Medicine

## 2014-09-08 ENCOUNTER — Ambulatory Visit (HOSPITAL_BASED_OUTPATIENT_CLINIC_OR_DEPARTMENT_OTHER): Payer: 59 | Admitting: Nurse Practitioner

## 2014-09-08 ENCOUNTER — Telehealth: Payer: Self-pay | Admitting: *Deleted

## 2014-09-08 ENCOUNTER — Ambulatory Visit (HOSPITAL_BASED_OUTPATIENT_CLINIC_OR_DEPARTMENT_OTHER): Payer: 59

## 2014-09-08 VITALS — BP 118/72 | HR 86 | Temp 98.9°F | Resp 18 | Ht 66.0 in | Wt 155.9 lb

## 2014-09-08 DIAGNOSIS — C9000 Multiple myeloma not having achieved remission: Secondary | ICD-10-CM

## 2014-09-08 DIAGNOSIS — J40 Bronchitis, not specified as acute or chronic: Secondary | ICD-10-CM | POA: Insufficient documentation

## 2014-09-08 DIAGNOSIS — R55 Syncope and collapse: Secondary | ICD-10-CM

## 2014-09-08 LAB — COMPREHENSIVE METABOLIC PANEL (CC13)
ALBUMIN: 4.1 g/dL (ref 3.5–5.0)
ALK PHOS: 50 U/L (ref 40–150)
ALT: 30 U/L (ref 0–55)
AST: 30 U/L (ref 5–34)
Anion Gap: 11 mEq/L (ref 3–11)
BUN: 13.1 mg/dL (ref 7.0–26.0)
CHLORIDE: 107 meq/L (ref 98–109)
CO2: 22 mEq/L (ref 22–29)
CREATININE: 1.1 mg/dL (ref 0.6–1.1)
Calcium: 9.3 mg/dL (ref 8.4–10.4)
EGFR: 59 mL/min/{1.73_m2} — ABNORMAL LOW (ref >90)
Glucose: 93 mg/dl (ref 70–140)
POTASSIUM: 3.9 meq/L (ref 3.5–5.1)
Sodium: 140 mEq/L (ref 136–145)
Total Bilirubin: 0.35 mg/dL (ref 0.20–1.20)
Total Protein: 6.5 g/dL (ref 6.4–8.3)

## 2014-09-08 LAB — CBC WITH DIFFERENTIAL/PLATELET
BASO%: 0.6 % (ref 0.0–2.0)
Basophils Absolute: 0 10*3/uL (ref 0.0–0.1)
EOS%: 0.6 % (ref 0.0–7.0)
Eosinophils Absolute: 0 10*3/uL (ref 0.0–0.5)
HCT: 33.2 % — ABNORMAL LOW (ref 34.8–46.6)
HGB: 10.8 g/dL — ABNORMAL LOW (ref 11.6–15.9)
LYMPH#: 0.8 10*3/uL — AB (ref 0.9–3.3)
LYMPH%: 22.1 % (ref 14.0–49.7)
MCH: 28.2 pg (ref 25.1–34.0)
MCHC: 32.5 g/dL (ref 31.5–36.0)
MCV: 86.6 fL (ref 79.5–101.0)
MONO#: 0.6 10*3/uL (ref 0.1–0.9)
MONO%: 14.7 % — ABNORMAL HIGH (ref 0.0–14.0)
NEUT#: 2.4 10*3/uL (ref 1.5–6.5)
NEUT%: 62 % (ref 38.4–76.8)
Platelets: 152 10*3/uL (ref 145–400)
RBC: 3.83 10*6/uL (ref 3.70–5.45)
RDW: 14.6 % — AB (ref 11.2–14.5)
WBC: 3.8 10*3/uL — ABNORMAL LOW (ref 3.9–10.3)

## 2014-09-08 MED ORDER — AZITHROMYCIN 250 MG PO TABS: ORAL_TABLET | ORAL | Status: DC

## 2014-09-08 NOTE — Telephone Encounter (Signed)
   Provider input needed: PT. "PASSED OUT"   Reason for call: PT. "PASSED OUT" AT WORK. HER HUSBAND WANTS PT. "CHECKED OUT" BEFORE HE GOES TO WORK.  PT. GOT HOT BEFORE SHE PASSED OUT. PT. WAS "CHECKED OUT BY THE EMT AT [facility]. UNSURE WHY PT. PASSED OUT.   ALLERGIES:  is allergic to atorvastatin; rosuvastatin; sulfamethoxazole; and sulfonamide derivatives.  Patient last received chemotherapy/ treatment on n/a  Patient was last seen in the office on 08/04/14   Next appt is 10/13/14  Is patient having fevers greater than 100.5?  no   Is patient having uncontrolled pain, or new pain? no   Is patient having new back pain that changes with position (worsens or eases when laying down?)  no   Is patient able to eat and drink? yes    Is patient able to pass stool without difficulty?   N/A     Is patient having uncontrolled nausea?  no    family calls 09/08/2014 with complaint of  AS ABOVE   Summary Based on the above information advised patient to  [place].   Waverly Ferrari M  09/08/2014, 12:14 PM   Background Info  Victoria Gates   DOB: 1951-09-22   MR#: [id number]   CSN#   [id number] 09/08/2014

## 2014-09-11 ENCOUNTER — Encounter: Payer: Self-pay | Admitting: Nurse Practitioner

## 2014-09-11 DIAGNOSIS — R55 Syncope and collapse: Secondary | ICD-10-CM | POA: Insufficient documentation

## 2014-09-11 NOTE — Assessment & Plan Note (Signed)
Patient experienced an apparent full syncopal episode while at work today. She has no recollection of this event. She denies any pre--syncopal symptoms whatsoever. Patient states that she fully recovered prior to the EMTs arrival. She denies any current new symptoms whatsoever.reviewed all of patient's lab results today with her in detail. Blood sugar was within normal limits; despite the history of diabetes. Patient does not appear dehydrated. Patient denies any headaches, vision changes, or other neurological symptoms. Syncopal event could very well be related to becoming too warm; since patient states she was in an enclosed room with the sun shining in and the room was very warm. Will continue to monitor closely.  Patient was advised to go directly to the emergency department if she develops any new or worsening symptoms whatsoever.

## 2014-09-11 NOTE — Progress Notes (Signed)
will   SYMPTOM MANAGEMENT CLINIC   HPI: Victoria Gates 63 y.o. female diagnosed with multiple myeloma. Patient is status post refills mouth/Cytoxan/dexamethasone chemotherapy regimen: As well as Zometa therapy. 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  Patient called the cancer Center today requesting urgent care visit. She states that she experienced a full syncopal event while at work today. She states that she works at [employer] at [employer]; and was in an enclosed room which was very warm. She has no recollection of the syncopal event. She states that the EMts evaluated her at the scene; and that all of her symptoms had completely resolved by the time they arrived. Patient has history of diabetes; but states it is then fully under control since completing the dexamethasone portion of her chemotherapy regimen.  Patient is complaining of approximate one-week history of a mild URI with an occasional congested cough. She denies any headaches, vision changes, or other neurological changes. She denies any recent fevers or chills.  HPI  CURRENT THERAPY: Upcoming Treatment Dates - MYELOMA SALVAGE Cyclophosphamide / Carfilzomib / Dexamethasone (CCd) q28d Days with orders from any treatment category:  No upcoming days in selected categories.    ROS  Past Medical History  Diagnosis Date  . COLONIC POLYPS, HX OF 04/24/2007    hyperplastic only  . OSTEOPOROSIS 04/24/2007  . GERD 09/21/2010  . DM 03/18/2008  . HYPERCHOLESTEROLEMIA 07/03/2009  . ANEMIA-IRON DEFICIENCY 04/24/2007  . ALLERGIC RHINITIS CAUSE UNSPECIFIED 01/02/2009  . ESOPHAGEAL STRICTURE 04/15/2007    s/p dilitation  . HIATAL HERNIA 04/15/2007  . CERVICAL RADICULOPATHY, LEFT 02/01/2010  . 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 Chest pain 03/2012    a. 04/15/2012 Ex MV: Ex time 10 mins, EF 63%, no ischemia/infarct.  Occas PAC's/PVC's.    Past Surgical History  Procedure Laterality Date  . Tubal ligation    .  Esophagogastroduodenoscopy  04/15/2007    has DM; HYPERCHOLESTEROLEMIA; ANEMIA-IRON DEFICIENCY; OTITIS EXTERNA; CERUMEN IMPACTION, RIGHT; HYPERTENSION; URI; Allergic rhinitis; ESOPHAGEAL STRICTURE; HIATAL HERNIA; CORNS AND CALLOSITIES; CERVICAL RADICULOPATHY, LEFT; MYALGIA; OSTEOPOROSIS; Unspecified Chest Pain; OTHER DYSPHAGIA; WRIST INJURY, RIGHT; COLONIC POLYPS, HX OF; GERD; HERPES ZOSTER; Routine general medical examination at a health care facility; Lipoma of shoulder; Midsternal chest pain; Hyperproteinemia; Monoclonal paraproteinemia; Multiple myeloma; Bronchitis; and Syncope on her problem list.     is allergic to atorvastatin; rosuvastatin; sulfamethoxazole; and sulfonamide derivatives.    Medication List       This list is accurate as of: 09/08/14 11:59 PM.  Always use your most recent med list.               azithromycin 250 MG tablet  Commonly known as:  ZITHROMAX Z-PAK  Take 2 tabs (500 mg) PO QD x 1 day; then take 1 tab (250 mg) PO QD till gone.     Calcium 500-125 MG-UNIT Tabs  Take 1 tablet by mouth daily.     cholecalciferol 1000 UNITS tablet  Commonly known as:  VITAMIN D  Take 1,000 Units by mouth daily.     lansoprazole 30 MG capsule  Commonly known as:  PREVACID  Take 1 capsule (30 mg total) by mouth daily.     potassium chloride SA 20 MEQ tablet  Commonly known as:  K-DUR,KLOR-CON  Take 20 mEq by mouth 2 (two) times daily.     VALTREX 500 MG tablet  Generic drug:  valACYclovir  Take 500 mg by mouth 2 (two) times daily.  vitamin C 1000 MG tablet  Take 1,000 mg by mouth.         PHYSICAL EXAMINATION  Blood pressure 118/72, pulse 86, temperature 98.9 F (37.2 C), temperature source Oral, resp. rate 18, height $RemoveBe'5\' 6"'pBXnLtYpJ$  (1.676 m), weight 155 lb 14.4 oz (70.716 kg).  Physical Exam  Constitutional: She is oriented to person, place, and time and well-developed, well-nourished, and in no distress.  HENT:  Head: Normocephalic and atraumatic.  Right  Ear: External ear normal.  Left Ear: External ear normal.  Mouth/Throat: Oropharynx is clear and moist.  Patient has mild nasal congestion on exam. Oropharynx clear. No facial tenderness on exam.  Eyes: Conjunctivae and EOM are normal. Pupils are equal, round, and reactive to light. Right eye exhibits no discharge. Left eye exhibits no discharge. No scleral icterus.  Neck: Normal range of motion. Neck supple. No JVD present. No tracheal deviation present. No thyromegaly present.  Cardiovascular: Normal rate, regular rhythm, normal heart sounds and intact distal pulses.   Pulmonary/Chest: Effort normal and breath sounds normal. No respiratory distress. She has no wheezes. She has no rales. She exhibits no tenderness.  Occasional congested cough on exam; but no shortness of breath or respiratory distress.  Abdominal: Soft. Bowel sounds are normal. She exhibits no distension and no mass. There is no tenderness. There is no rebound and no guarding.  Musculoskeletal: Normal range of motion. She exhibits no edema or tenderness.  Lymphadenopathy:    She has no cervical adenopathy.  Neurological: She is alert and oriented to person, place, and time. Gait normal.  Skin: Skin is warm and dry. No rash noted. No erythema.  Psychiatric: Affect normal.  Nursing note and vitals reviewed.   LABORATORY DATA:. Appointment on 09/08/2014  Component Date Value Ref Range Status  . WBC 09/08/2014 3.8* 3.9 - 10.3 10e3/uL Final  . NEUT# 09/08/2014 2.4  1.5 - 6.5 10e3/uL Final  . HGB 09/08/2014 10.8* 11.6 - 15.9 g/dL Final  . HCT 09/08/2014 33.2* 34.8 - 46.6 % Final  . Platelets 09/08/2014 152  145 - 400 10e3/uL Final  . MCV 09/08/2014 86.6  79.5 - 101.0 fL Final  . MCH 09/08/2014 28.2  25.1 - 34.0 pg Final  . MCHC 09/08/2014 32.5  31.5 - 36.0 g/dL Final  . RBC 09/08/2014 3.83  3.70 - 5.45 10e6/uL Final  . RDW 09/08/2014 14.6* 11.2 - 14.5 % Final  . lymph# 09/08/2014 0.8* 0.9 - 3.3 10e3/uL Final  . MONO#  09/08/2014 0.6  0.1 - 0.9 10e3/uL Final  . Eosinophils Absolute 09/08/2014 0.0  0.0 - 0.5 10e3/uL Final  . Basophils Absolute 09/08/2014 0.0  0.0 - 0.1 10e3/uL Final  . NEUT% 09/08/2014 62.0  38.4 - 76.8 % Final  . LYMPH% 09/08/2014 22.1  14.0 - 49.7 % Final  . MONO% 09/08/2014 14.7* 0.0 - 14.0 % Final  . EOS% 09/08/2014 0.6  0.0 - 7.0 % Final  . BASO% 09/08/2014 0.6  0.0 - 2.0 % Final  . Sodium 09/08/2014 140  136 - 145 mEq/L Final  . Potassium 09/08/2014 3.9  3.5 - 5.1 mEq/L Final  . Chloride 09/08/2014 107  98 - 109 mEq/L Final  . CO2 09/08/2014 22  22 - 29 mEq/L Final  . Glucose 09/08/2014 93  70 - 140 mg/dl Final  . BUN 09/08/2014 13.1  7.0 - 26.0 mg/dL Final  . Creatinine 09/08/2014 1.1  0.6 - 1.1 mg/dL Final  . Total Bilirubin 09/08/2014 0.35  0.20 - 1.20 mg/dL  Final  . Alkaline Phosphatase 09/08/2014 50  40 - 150 U/L Final  . AST 09/08/2014 30  5 - 34 U/L Final  . ALT 09/08/2014 30  0 - 55 U/L Final  . Total Protein 09/08/2014 6.5  6.4 - 8.3 g/dL Final  . Albumin 09/08/2014 4.1  3.5 - 5.0 g/dL Final  . Calcium 09/08/2014 9.3  8.4 - 10.4 mg/dL Final  . Anion Gap 09/08/2014 11  3 - 11 mEq/L Final  . EGFR 09/08/2014 59* >90 ml/min/1.73 m2 Final   eGFR is calculated using the CKD-EPI Creatinine Equation (2009)     RADIOGRAPHIC STUDIES: No results found.  ASSESSMENT/PLAN:    Bronchitis Patient appears to have some mild nasal congestion and bilateral ear fullness. Patient also has a mild, congestive cough which is nonproductive. Will prescribe Z-Pak for treatment of mild bronchitis today.  Multiple myeloma Patient last received her pills med/Cytoxan/dexamethasone chemotherapy regimen; as well as Zometa infusion on 03/29/2014. She obtained a stem cell transplant per [facility] on 04/27/2014. She is currently on observation only at this time. Patient has plans to return to the [facility] on 10/06/2014 for labs. She is plans to return for followup visit on  10/13/2013.  Syncope Patient experienced an apparent full syncopal episode while at work today. She has no recollection of this event. She denies any pre--syncopal symptoms whatsoever. Patient states that she fully recovered prior to the EMTs arrival. She denies any current new symptoms whatsoever.reviewed all of patient's lab results today with her in detail. Blood sugar was within normal limits; despite the history of diabetes. Patient does not appear dehydrated. Patient denies any headaches, vision changes, or other neurological symptoms. Syncopal event could very well be related to becoming too warm; since patient states she was in an enclosed room with the sun shining in and the room was very warm. Will continue to monitor closely.  Patient was advised to go directly to the emergency department if she develops any new or worsening symptoms whatsoever.   Patient stated understanding of all instructions; and was in agreement with this plan of care. The patient knows to call the clinic with any problems, questions or concerns.   Review/collaboration with Dr. Julien Nordmann regarding all aspects of patient's visit today.   Total time spent with patient was 25 minutes;  with greater than 75 percent of that time spent in face to face counseling regarding her symptoms, and coordination of care and follow up.  Disclaimer: This note was dictated with voice recognition software. Similar sounding words can inadvertently be transcribed and may not be corrected upon review.   Drue Second, NP 09/11/2014

## 2014-09-11 NOTE — Assessment & Plan Note (Signed)
Patient last received her pills med/Cytoxan/dexamethasone chemotherapy regimen; as well as Zometa infusion on 03/29/2014. She obtained a stem cell transplant per [facility] on 04/27/2014. She is currently on observation only at this time. Patient has plans to return to the [facility] on 10/06/2014 for labs. She is plans to return for followup visit on 10/13/2013.

## 2014-09-11 NOTE — Assessment & Plan Note (Signed)
Patient appears to have some mild nasal congestion and bilateral ear fullness. Patient also has a mild, congestive cough which is nonproductive. Will prescribe Z-Pak for treatment of mild bronchitis today.

## 2014-09-12 ENCOUNTER — Ambulatory Visit: Payer: 59 | Admitting: Physician Assistant

## 2014-10-06 ENCOUNTER — Other Ambulatory Visit (HOSPITAL_BASED_OUTPATIENT_CLINIC_OR_DEPARTMENT_OTHER): Payer: 59

## 2014-10-06 DIAGNOSIS — D649 Anemia, unspecified: Secondary | ICD-10-CM

## 2014-10-06 DIAGNOSIS — C9000 Multiple myeloma not having achieved remission: Secondary | ICD-10-CM

## 2014-10-06 LAB — CBC WITH DIFFERENTIAL/PLATELET
BASO%: 1.2 % (ref 0.0–2.0)
BASOS ABS: 0 10*3/uL (ref 0.0–0.1)
EOS%: 1.9 % (ref 0.0–7.0)
Eosinophils Absolute: 0.1 10*3/uL (ref 0.0–0.5)
HEMATOCRIT: 37 % (ref 34.8–46.6)
HEMOGLOBIN: 12 g/dL (ref 11.6–15.9)
LYMPH#: 1.1 10*3/uL (ref 0.9–3.3)
LYMPH%: 36.2 % (ref 14.0–49.7)
MCH: 27.8 pg (ref 25.1–34.0)
MCHC: 32.3 g/dL (ref 31.5–36.0)
MCV: 86.1 fL (ref 79.5–101.0)
MONO#: 0.3 10*3/uL (ref 0.1–0.9)
MONO%: 8.8 % (ref 0.0–14.0)
NEUT#: 1.6 10*3/uL (ref 1.5–6.5)
NEUT%: 51.9 % (ref 38.4–76.8)
Platelets: 160 10*3/uL (ref 145–400)
RBC: 4.3 10*6/uL (ref 3.70–5.45)
RDW: 15.2 % — ABNORMAL HIGH (ref 11.2–14.5)
WBC: 3.1 10*3/uL — AB (ref 3.9–10.3)

## 2014-10-06 LAB — COMPREHENSIVE METABOLIC PANEL (CC13)
ALK PHOS: 49 U/L (ref 40–150)
ALT: 21 U/L (ref 0–55)
AST: 21 U/L (ref 5–34)
Albumin: 4.2 g/dL (ref 3.5–5.0)
Anion Gap: 8 mEq/L (ref 3–11)
BILIRUBIN TOTAL: 0.7 mg/dL (ref 0.20–1.20)
BUN: 14.6 mg/dL (ref 7.0–26.0)
CO2: 26 mEq/L (ref 22–29)
CREATININE: 1 mg/dL (ref 0.6–1.1)
Calcium: 9.9 mg/dL (ref 8.4–10.4)
Chloride: 108 mEq/L (ref 98–109)
EGFR: 71 mL/min/{1.73_m2} — AB (ref >90)
GLUCOSE: 90 mg/dL (ref 70–140)
Potassium: 3.6 mEq/L (ref 3.5–5.1)
Sodium: 143 mEq/L (ref 136–145)
TOTAL PROTEIN: 6.8 g/dL (ref 6.4–8.3)

## 2014-10-06 LAB — LACTATE DEHYDROGENASE (CC13): LDH: 162 U/L (ref 125–245)

## 2014-10-10 LAB — KAPPA/LAMBDA LIGHT CHAINS
KAPPA LAMBDA RATIO: 1.28 (ref 0.26–1.65)
Kappa free light chain: 1.25 mg/dL (ref 0.33–1.94)
LAMBDA FREE LGHT CHN: 0.98 mg/dL (ref 0.57–2.63)

## 2014-10-10 LAB — IGG, IGA, IGM
IGM, SERUM: 28 mg/dL — AB (ref 52–322)
IgA: 83 mg/dL (ref 69–380)
IgG (Immunoglobin G), Serum: 910 mg/dL (ref 690–1700)

## 2014-10-10 LAB — BETA 2 MICROGLOBULIN, SERUM: BETA 2 MICROGLOBULIN: 2.03 mg/L (ref <=2.51)

## 2014-10-13 ENCOUNTER — Other Ambulatory Visit: Payer: Self-pay | Admitting: Medical Oncology

## 2014-10-13 ENCOUNTER — Telehealth: Payer: Self-pay | Admitting: Medical Oncology

## 2014-10-13 ENCOUNTER — Ambulatory Visit (HOSPITAL_BASED_OUTPATIENT_CLINIC_OR_DEPARTMENT_OTHER): Payer: 59 | Admitting: Internal Medicine

## 2014-10-13 ENCOUNTER — Encounter: Payer: Self-pay | Admitting: Internal Medicine

## 2014-10-13 ENCOUNTER — Telehealth: Payer: Self-pay | Admitting: Internal Medicine

## 2014-10-13 VITALS — BP 105/70 | HR 88 | Temp 98.1°F | Resp 20 | Ht 66.0 in | Wt 155.2 lb

## 2014-10-13 DIAGNOSIS — C9000 Multiple myeloma not having achieved remission: Secondary | ICD-10-CM

## 2014-10-13 MED ORDER — LENALIDOMIDE 10 MG PO CAPS: 10.0000 mg | ORAL_CAPSULE | Freq: Every day | ORAL | Status: DC

## 2014-10-13 MED ORDER — WARFARIN SODIUM 2 MG PO TABS: 2.0000 mg | ORAL_TABLET | Freq: Every day | ORAL | Status: DC

## 2014-10-13 NOTE — Telephone Encounter (Signed)
Gave avs & cal for Feb. °

## 2014-10-13 NOTE — Progress Notes (Signed)
Per Julien Nordmann a coumadin rx sent to local pharmacy

## 2014-10-13 NOTE — Telephone Encounter (Signed)
Revlimid escribed to [facility].

## 2014-10-13 NOTE — Progress Notes (Signed)
[facility] [phone]   [phone]  OFFICE PROGRESS NOTE  [name], MD 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 First dose on 11/15/2013. Status post 5 cycles. 2) Status post autologous stem cell transplant 04/27/2014 at [facility].   CURRENT THERAPY: Maintenance Revlimid 10 mg by mouth daily.  INTERVAL HISTORY: Victoria Gates 64 y.o. female returns to the clinic today for follow up visit accompanied by her husband. The patient is feeling fine today with no specific complaints. She denied having any significant fever or chills. She denied having any significant chest pain, shortness of breath, cough or hemoptysis. The patient denied having any significant weight loss or night sweats. She has no nausea or vomiting. She had repeat myeloma panel performed earlier today and she is here for evaluation and discussion of her lab results and recommendation regarding maintenance therapy.  MEDICAL HISTORY: Past Medical History  Diagnosis Date  . COLONIC POLYPS, HX OF 04/24/2007    hyperplastic only  . OSTEOPOROSIS 04/24/2007  . GERD 09/21/2010  . DM 03/18/2008  . HYPERCHOLESTEROLEMIA 07/03/2009  . ANEMIA-IRON DEFICIENCY 04/24/2007  . ALLERGIC RHINITIS CAUSE UNSPECIFIED 01/02/2009  . ESOPHAGEAL STRICTURE 04/15/2007    s/p dilitation  . HIATAL HERNIA 04/15/2007  . CERVICAL RADICULOPATHY, LEFT 02/01/2010  . 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 Chest pain 03/2012    a. 04/15/2012 Ex MV: Ex time 10 mins, EF 63%, no ischemia/infarct.  Occas PAC's/PVC's.    ALLERGIES:  is allergic to atorvastatin; rosuvastatin; sulfamethoxazole; and sulfonamide derivatives.  MEDICATIONS:  Current Outpatient Prescriptions  Medication Sig Dispense Refill  . Ascorbic Acid (VITAMIN C) 1000 MG tablet Take 1,000 mg  by mouth.    . Calcium 500-125 MG-UNIT TABS Take 1 tablet by mouth daily.      . cholecalciferol (VITAMIN D) 1000 UNITS tablet Take 1,000 Units by mouth daily.    . lansoprazole (PREVACID) 30 MG capsule Take 1 capsule (30 mg total) by mouth daily. 30 capsule 1  . potassium chloride SA (K-DUR,KLOR-CON) 20 MEQ tablet Take 20 mEq by mouth 2 (two) times daily.    . valACYclovir (VALTREX) 500 MG tablet Take 500 mg by mouth 2 (two) times daily.     No current facility-administered medications for this visit.    SURGICAL HISTORY:  Past Surgical History  Procedure Laterality Date  . Tubal ligation    . Esophagogastroduodenoscopy  04/15/2007    REVIEW OF SYSTEMS:  Constitutional: positive for fatigue Eyes: negative Ears, nose, mouth, throat, and face: negative Respiratory: negative Cardiovascular: negative Gastrointestinal: negative Genitourinary:negative Integument/breast: negative Hematologic/lymphatic: negative Musculoskeletal:negative Neurological: negative Behavioral/Psych: negative Endocrine: negative Allergic/Immunologic: negative   PHYSICAL EXAMINATION: General appearance: alert, cooperative, fatigued and no distress Head: Normocephalic, without obvious abnormality, atraumatic Neck: no adenopathy, no JVD, supple, symmetrical, trachea midline and thyroid not enlarged, symmetric, no tenderness/mass/nodules Lymph nodes: Cervical, supraclavicular, and axillary nodes normal. Resp: clear to auscultation bilaterally Back: symmetric, no curvature. ROM normal. No CVA tenderness. Cardio: regular rate and rhythm, S1, S2 normal, no murmur, click, rub or gallop GI: soft, non-tender; bowel sounds normal; no masses,  no organomegaly Extremities: extremities normal, atraumatic, no cyanosis or edema Neurologic: Alert and oriented X 3, normal strength and tone. Normal symmetric reflexes. Normal coordination and gait  ECOG PERFORMANCE STATUS: 1 - Symptomatic but completely ambulatory  Blood  pressure 105/70, pulse 88, temperature 98.1 F (36.7 C), temperature source Oral, resp. rate 20, height 5' 6" (1.676 m), weight 155 lb 3.2 oz (70.398 kg), SpO2 100 %.  LABORATORY DATA: Lab Results  Component Value Date   WBC 3.1* 10/06/2014   HGB 12.0 10/06/2014   HCT 37.0 10/06/2014   MCV 86.1 10/06/2014   PLT 160 10/06/2014      Chemistry      Component Value Date/Time   NA 143 10/06/2014 0831   NA 140 04/14/2013 0916   K 3.6 10/06/2014 0831   K 3.8 04/14/2013 0916   CL 105 04/14/2013 0916   CO2 26 10/06/2014 0831   CO2 28 04/14/2013 0916   BUN 14.6 10/06/2014 0831   BUN 10 04/14/2013 0916   CREATININE 1.0 10/06/2014 0831   CREATININE 1.0 04/14/2013 0916      Component Value Date/Time   CALCIUM 9.9 10/06/2014 0831   CALCIUM 9.7 04/14/2013 0916   CALCIUM 9.0 12/18/2009 2256   ALKPHOS 49 10/06/2014 0831   ALKPHOS 42 04/14/2013 0916   AST 21 10/06/2014 0831   AST 23 04/14/2013 0916   ALT 21 10/06/2014 0831   ALT 24 04/14/2013 0916   BILITOT 0.70 10/06/2014 0831   BILITOT 0.4 04/14/2013 0916     ASSESSMENT AND PLAN: This is a very pleasant 63 years old African American female who was recently diagnosed with IgA multiple myeloma with significant elevation of IgA over 7000.  She is currently undergoing systemic chemotherapy with Carfilzomib, Cytoxan and dexamethasone and tolerating it fairly well.  she status post 5 cycles followed by autologous peripheral blood stem cell transplant in July of 2015. The patient is feeling fine today with no specific complaints. Her myeloma panel showed no evidence for disease recurrence. I had a lengthy discussion with the patient and her husband as well as her son over the phone about maintenance Revlimid. I discussed with the patient the adverse effect of this treatment including the possibility of secondary malignancy. The patient and her family would like to proceed with the maintenance treatment as planned. We will start Revlimid 10 mg  by mouth daily for the next 2 months before switching to 15 mg. I will also start the patient on low-dose Coumadin for prophylactic anticoagulation. Her blood work is unremarkable except for mild anemia. She will come back for follow-up visit in one month's for reevaluation and management of any adverse effect of her treatment. She was advised to call immediately she has any concerning symptoms in the interval. The patient voices understanding of current disease status and treatment options and is in agreement with the current care plan.  All questions were answered. The patient knows to call the clinic with any problems, questions or concerns. We can certainly see the patient much sooner if necessary.  Disclaimer: This note was dictated with voice recognition software. Similar sounding words can inadvertently be transcribed and may not be corrected upon review.       

## 2014-10-18 ENCOUNTER — Telehealth: Payer: Self-pay | Admitting: *Deleted

## 2014-10-18 NOTE — Telephone Encounter (Signed)
PER TRIAGE VM-  Pt is requesting a return call regarding " when am I supposed to start the Revlimid ?"  " my instructions say to wait until after the vaccinations I get in Feb "  Return call number left by pt is [phone].  THIS MESSAGE WILL BE SENT TO MD AND RN AT DESK FOR FOLLOW UP

## 2014-10-19 NOTE — Telephone Encounter (Signed)
Per Dr Octaviano Batty office, ok for pt start revlimid since she is over 90 post transplant.  Informed patient.  Pt's husband also wanted to know whether they have to use protection when they have sex.  Per Dr Vista Mink, no protection is necessary.  Pt verbalized understanding.

## 2014-10-19 NOTE — Telephone Encounter (Signed)
Per Dr Vista Mink, Called Dr Octaviano Batty office to determine whether it is okay for to start maintanence revlimid.  Informed pt that once we hear something we will call her back.

## 2014-11-08 ENCOUNTER — Encounter: Payer: Self-pay | Admitting: Endocrinology

## 2014-11-10 ENCOUNTER — Other Ambulatory Visit: Payer: Self-pay | Admitting: *Deleted

## 2014-11-10 ENCOUNTER — Telehealth: Payer: Self-pay | Admitting: Internal Medicine

## 2014-11-10 ENCOUNTER — Other Ambulatory Visit (HOSPITAL_BASED_OUTPATIENT_CLINIC_OR_DEPARTMENT_OTHER): Payer: 59

## 2014-11-10 ENCOUNTER — Encounter: Payer: Self-pay | Admitting: Internal Medicine

## 2014-11-10 ENCOUNTER — Ambulatory Visit (HOSPITAL_BASED_OUTPATIENT_CLINIC_OR_DEPARTMENT_OTHER): Payer: 59 | Admitting: Internal Medicine

## 2014-11-10 VITALS — BP 124/71 | HR 57 | Temp 97.8°F | Resp 18 | Ht 66.0 in | Wt 158.5 lb

## 2014-11-10 DIAGNOSIS — C9000 Multiple myeloma not having achieved remission: Secondary | ICD-10-CM

## 2014-11-10 LAB — CBC WITH DIFFERENTIAL/PLATELET
BASO%: 0.7 % (ref 0.0–2.0)
Basophils Absolute: 0 10*3/uL (ref 0.0–0.1)
EOS%: 4.3 % (ref 0.0–7.0)
Eosinophils Absolute: 0.1 10*3/uL (ref 0.0–0.5)
HCT: 34.7 % — ABNORMAL LOW (ref 34.8–46.6)
HGB: 10.9 g/dL — ABNORMAL LOW (ref 11.6–15.9)
LYMPH#: 1.4 10*3/uL (ref 0.9–3.3)
LYMPH%: 48 % (ref 14.0–49.7)
MCH: 27 pg (ref 25.1–34.0)
MCHC: 31.6 g/dL (ref 31.5–36.0)
MCV: 85.6 fL (ref 79.5–101.0)
MONO#: 0.4 10*3/uL (ref 0.1–0.9)
MONO%: 12.8 % (ref 0.0–14.0)
NEUT#: 1 10*3/uL — ABNORMAL LOW (ref 1.5–6.5)
NEUT%: 34.2 % — ABNORMAL LOW (ref 38.4–76.8)
Platelets: 163 10*3/uL (ref 145–400)
RBC: 4.05 10*6/uL (ref 3.70–5.45)
RDW: 14.8 % — ABNORMAL HIGH (ref 11.2–14.5)
WBC: 2.9 10*3/uL — ABNORMAL LOW (ref 3.9–10.3)

## 2014-11-10 LAB — COMPREHENSIVE METABOLIC PANEL (CC13)
ALT: 23 U/L (ref 0–55)
AST: 23 U/L (ref 5–34)
Albumin: 3.9 g/dL (ref 3.5–5.0)
Alkaline Phosphatase: 56 U/L (ref 40–150)
Anion Gap: 8 mEq/L (ref 3–11)
BUN: 11 mg/dL (ref 7.0–26.0)
CO2: 24 mEq/L (ref 22–29)
Calcium: 8.8 mg/dL (ref 8.4–10.4)
Chloride: 110 mEq/L — ABNORMAL HIGH (ref 98–109)
Creatinine: 0.9 mg/dL (ref 0.6–1.1)
EGFR: 79 mL/min/{1.73_m2} — AB (ref >90)
GLUCOSE: 85 mg/dL (ref 70–140)
Potassium: 3.5 mEq/L (ref 3.5–5.1)
Sodium: 142 mEq/L (ref 136–145)
Total Bilirubin: 0.65 mg/dL (ref 0.20–1.20)
Total Protein: 6.8 g/dL (ref 6.4–8.3)

## 2014-11-10 MED ORDER — LENALIDOMIDE 10 MG PO CAPS: 10.0000 mg | ORAL_CAPSULE | Freq: Every day | ORAL | Status: DC

## 2014-11-10 NOTE — Telephone Encounter (Signed)
Gave avs & calendar for 59month per patient. No pof.

## 2014-11-10 NOTE — Progress Notes (Signed)
    [facility] [phone]   [phone]  OFFICE PROGRESS NOTE  ELLISON, SEAN, MD 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 First dose on 11/15/2013. Status post 5 cycles. 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   CURRENT THERAPY: Maintenance Revlimid 10 mg by mouth daily. Status post 1 months of treatment.  INTERVAL HISTORY: Victoria Gates 64 y.o. female returns to the clinic today for follow up visit accompanied by her husband. The patient is feeling fine today with no specific complaints. She is tolerating her treatment with maintenance Revlimid fairly well with no significant adverse effects. She had some aching pain and recently and she was taking a lot of ibuprofen. She denied having any significant fever or chills. She denied having any significant chest pain, shortness of breath, cough or hemoptysis. The patient denied having any significant weight loss or night sweats. She has no nausea or vomiting.  MEDICAL HISTORY: Past Medical History  Diagnosis Date  . COLONIC POLYPS, HX OF 04/24/2007    hyperplastic only  . OSTEOPOROSIS 04/24/2007  . GERD 09/21/2010  . DM 03/18/2008  . HYPERCHOLESTEROLEMIA 07/03/2009  . ANEMIA-IRON DEFICIENCY 04/24/2007  . ALLERGIC RHINITIS CAUSE UNSPECIFIED 01/02/2009  . ESOPHAGEAL STRICTURE 04/15/2007    s/p dilitation  . HIATAL HERNIA 04/15/2007  . CERVICAL RADICULOPATHY, LEFT 02/01/2010  . 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 Chest pain 03/2012    a. 04/15/2012 Ex MV: Ex time 10 mins, EF 63%, no ischemia/infarct.  Occas PAC's/PVC's.    ALLERGIES:  is allergic to atorvastatin; rosuvastatin; sulfamethoxazole; and sulfonamide derivatives.  MEDICATIONS:  Current Outpatient Prescriptions  Medication Sig Dispense Refill  .  Ascorbic Acid (VITAMIN C) 1000 MG tablet Take 1,000 mg by mouth.    . Calcium 500-125 MG-UNIT TABS Take 1 tablet by mouth daily.      . cholecalciferol (VITAMIN D) 1000 UNITS tablet Take 1,000 Units by mouth daily.    . lansoprazole (PREVACID) 30 MG capsule Take 1 capsule (30 mg total) by mouth daily. 30 capsule 1  . lenalidomide (REVLIMID) 10 MG capsule Take 1 capsule (10 mg total) by mouth daily. 28 capsule 0  . potassium chloride SA (K-DUR,KLOR-CON) 20 MEQ tablet Take 20 mEq by mouth 2 (two) times daily.    . valACYclovir (VALTREX) 500 MG tablet Take 500 mg by mouth 2 (two) times daily.    . warfarin (COUMADIN) 2 MG tablet Take 1 tablet (2 mg total) by mouth daily. 30 tablet 2   No current facility-administered medications for this visit.    SURGICAL HISTORY:  Past Surgical History  Procedure Laterality Date  . Tubal ligation    . Esophagogastroduodenoscopy  04/15/2007    REVIEW OF SYSTEMS:  Constitutional: positive for fatigue Eyes: negative Ears, nose, mouth, throat, and face: negative Respiratory: negative Cardiovascular: negative Gastrointestinal: negative Genitourinary:negative Integument/breast: negative Hematologic/lymphatic: negative Musculoskeletal:negative Neurological: negative Behavioral/Psych: negative Endocrine: negative Allergic/Immunologic: negative   PHYSICAL EXAMINATION: General appearance: alert, cooperative, fatigued and no distress Head: Normocephalic, without obvious abnormality, atraumatic Neck: no adenopathy, no JVD, supple, symmetrical, trachea midline and thyroid not enlarged, symmetric, no tenderness/mass/nodules Lymph nodes: Cervical, supraclavicular, and axillary nodes normal. Resp: clear to auscultation bilaterally Back: symmetric, no curvature. ROM normal. No CVA tenderness. Cardio: regular rate and rhythm, S1, S2 normal, no murmur, click, rub or   gallop GI: soft, non-tender; bowel sounds normal; no masses,  no organomegaly Extremities:  extremities normal, atraumatic, no cyanosis or edema Neurologic: Alert and oriented X 3, normal strength and tone. Normal symmetric reflexes. Normal coordination and gait  ECOG PERFORMANCE STATUS: 1 - Symptomatic but completely ambulatory  Blood pressure 124/71, pulse 57, temperature 97.8 F (36.6 C), temperature source Oral, resp. rate 18, height 5' 6" (1.676 m), weight 158 lb 8 oz (71.895 kg), SpO2 98 %.  LABORATORY DATA: Lab Results  Component Value Date   WBC 2.9* 11/10/2014   HGB 10.9* 11/10/2014   HCT 34.7* 11/10/2014   MCV 85.6 11/10/2014   PLT 163 11/10/2014      Chemistry      Component Value Date/Time   NA 142 11/10/2014 0809   NA 140 04/14/2013 0916   K 3.5 11/10/2014 0809   K 3.8 04/14/2013 0916   CL 105 04/14/2013 0916   CO2 24 11/10/2014 0809   CO2 28 04/14/2013 0916   BUN 11.0 11/10/2014 0809   BUN 10 04/14/2013 0916   CREATININE 0.9 11/10/2014 0809   CREATININE 1.0 04/14/2013 0916      Component Value Date/Time   CALCIUM 8.8 11/10/2014 0809   CALCIUM 9.7 04/14/2013 0916   CALCIUM 9.0 12/18/2009 2256   ALKPHOS 56 11/10/2014 0809   ALKPHOS 42 04/14/2013 0916   AST 23 11/10/2014 0809   AST 23 04/14/2013 0916   ALT 23 11/10/2014 0809   ALT 24 04/14/2013 0916   BILITOT 0.65 11/10/2014 0809   BILITOT 0.4 04/14/2013 0916     ASSESSMENT AND PLAN: This is a very pleasant 64 years old [place] American female who was recently diagnosed with IgA multiple myeloma with significant elevation of IgA over 7000.  She is currently undergoing systemic chemotherapy with Carfilzomib, Cytoxan and dexamethasone and tolerating it fairly well.  she status post 5 cycles followed by autologous peripheral blood stem cell transplant in July of 2015. She was recently started on maintenance Revlimid 10 mg by mouth daily status post 1 months and tolerating her treatment fairly well. I recommended for her to continue her current maintenance treatment with Revlimid at the same dose.  After the second month, we will increase her dose to 15 mg by mouth daily. The patient has mild neutropenia, this could be secondary to treatment with Revlimid but I also advised her to discontinue taking ibuprofen as a potential cause. She will come back for follow-up visit in one month for reevaluation and management of any adverse effect of her treatment. She was advised to call immediately she has any concerning symptoms in the interval. The patient voices understanding of current disease status and treatment options and is in agreement with the current care plan.  All questions were answered. The patient knows to call the clinic with any problems, questions or concerns. We can certainly see the patient much sooner if necessary.  Disclaimer: This note was dictated with voice recognition software. Similar sounding words can inadvertently be transcribed and may not be corrected upon review.

## 2014-12-01 ENCOUNTER — Other Ambulatory Visit: Payer: Self-pay | Admitting: Medical Oncology

## 2014-12-01 DIAGNOSIS — C9000 Multiple myeloma not having achieved remission: Secondary | ICD-10-CM

## 2014-12-01 MED ORDER — LENALIDOMIDE 15 MG PO CAPS: 15.0000 mg | ORAL_CAPSULE | Freq: Every day | ORAL | Status: DC

## 2014-12-01 NOTE — Progress Notes (Signed)
Husband notified of revlimid dose change.

## 2014-12-08 ENCOUNTER — Telehealth: Payer: Self-pay | Admitting: Internal Medicine

## 2014-12-08 ENCOUNTER — Encounter: Payer: Self-pay | Admitting: Internal Medicine

## 2014-12-08 ENCOUNTER — Telehealth: Payer: Self-pay | Admitting: *Deleted

## 2014-12-08 ENCOUNTER — Ambulatory Visit (HOSPITAL_BASED_OUTPATIENT_CLINIC_OR_DEPARTMENT_OTHER): Payer: 59 | Admitting: Internal Medicine

## 2014-12-08 ENCOUNTER — Other Ambulatory Visit (HOSPITAL_BASED_OUTPATIENT_CLINIC_OR_DEPARTMENT_OTHER): Payer: 59

## 2014-12-08 VITALS — BP 119/60 | HR 60 | Temp 97.6°F | Resp 18 | Ht 66.0 in | Wt 151.8 lb

## 2014-12-08 DIAGNOSIS — C9000 Multiple myeloma not having achieved remission: Secondary | ICD-10-CM

## 2014-12-08 LAB — CBC WITH DIFFERENTIAL/PLATELET
BASO%: 1.5 % (ref 0.0–2.0)
BASOS ABS: 0.1 10*3/uL (ref 0.0–0.1)
EOS ABS: 0.3 10*3/uL (ref 0.0–0.5)
EOS%: 7.9 % — ABNORMAL HIGH (ref 0.0–7.0)
HEMATOCRIT: 33.9 % — AB (ref 34.8–46.6)
HGB: 11.2 g/dL — ABNORMAL LOW (ref 11.6–15.9)
LYMPH#: 1.4 10*3/uL (ref 0.9–3.3)
LYMPH%: 42.6 % (ref 14.0–49.7)
MCH: 28.3 pg (ref 25.1–34.0)
MCHC: 33 g/dL (ref 31.5–36.0)
MCV: 85.6 fL (ref 79.5–101.0)
MONO#: 0.4 10*3/uL (ref 0.1–0.9)
MONO%: 12.8 % (ref 0.0–14.0)
NEUT#: 1.2 10*3/uL — ABNORMAL LOW (ref 1.5–6.5)
NEUT%: 35.2 % — ABNORMAL LOW (ref 38.4–76.8)
Platelets: 141 10*3/uL — ABNORMAL LOW (ref 145–400)
RBC: 3.96 10*6/uL (ref 3.70–5.45)
RDW: 14.7 % — ABNORMAL HIGH (ref 11.2–14.5)
WBC: 3.3 10*3/uL — AB (ref 3.9–10.3)

## 2014-12-08 LAB — LACTATE DEHYDROGENASE (CC13): LDH: 156 U/L (ref 125–245)

## 2014-12-08 LAB — COMPREHENSIVE METABOLIC PANEL (CC13)
ALK PHOS: 49 U/L (ref 40–150)
ALT: 16 U/L (ref 0–55)
AST: 14 U/L (ref 5–34)
Albumin: 3.8 g/dL (ref 3.5–5.0)
Anion Gap: 10 mEq/L (ref 3–11)
BUN: 9.9 mg/dL (ref 7.0–26.0)
CHLORIDE: 109 meq/L (ref 98–109)
CO2: 24 meq/L (ref 22–29)
Calcium: 9.3 mg/dL (ref 8.4–10.4)
Creatinine: 0.9 mg/dL (ref 0.6–1.1)
EGFR: 83 mL/min/{1.73_m2} — ABNORMAL LOW (ref >90)
GLUCOSE: 92 mg/dL (ref 70–140)
Potassium: 3.8 mEq/L (ref 3.5–5.1)
Sodium: 142 mEq/L (ref 136–145)
TOTAL PROTEIN: 6.7 g/dL (ref 6.4–8.3)
Total Bilirubin: 0.77 mg/dL (ref 0.20–1.20)

## 2014-12-08 NOTE — Progress Notes (Signed)
[facility] [phone]   [phone]  OFFICE PROGRESS NOTE  [name], MD 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 First dose on 11/15/2013. Status post 5 cycles. 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 3) Maintenance Revlimid 10 mg by mouth daily. Status post 2 months of treatment.   CURRENT THERAPY: Maintenance Revlimid 15 mg by mouth daily.   INTERVAL HISTORY: Victoria Gates 64 y.o. female returns to the clinic today for follow up visit. The patient is feeling fine today with no specific complaints except for intermittent pain in the lower back with radiation to the left hip. She uses icy/hot pack with improvement in her symptoms. She is not interested in taking any pain medications. She is tolerating her treatment with maintenance Revlimid fairly well with no significant adverse effects. She denied having any significant fever or chills. She denied having any significant chest pain, shortness of breath, cough or hemoptysis. The patient denied having any significant weight loss or night sweats. She has no nausea or vomiting.  MEDICAL HISTORY: Past Medical History  Diagnosis Date  . COLONIC POLYPS, HX OF 04/24/2007    hyperplastic only  . OSTEOPOROSIS 04/24/2007  . GERD 09/21/2010  . DM 03/18/2008  . HYPERCHOLESTEROLEMIA 07/03/2009  . ANEMIA-IRON DEFICIENCY 04/24/2007  . ALLERGIC RHINITIS CAUSE UNSPECIFIED 01/02/2009  . ESOPHAGEAL STRICTURE 04/15/2007    s/p dilitation  . HIATAL HERNIA 04/15/2007  . CERVICAL RADICULOPATHY, LEFT 02/01/2010  . 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 Chest pain 03/2012    a. 04/15/2012 Ex MV: Ex time 10 mins, EF 63%, no ischemia/infarct.  Occas PAC's/PVC's.    ALLERGIES:  is allergic to atorvastatin; rosuvastatin;  sulfamethoxazole; and sulfonamide derivatives.  MEDICATIONS:  Current Outpatient Prescriptions  Medication Sig Dispense Refill  . Ascorbic Acid (VITAMIN C) 1000 MG tablet Take 1,000 mg by mouth.    . Calcium 500-125 MG-UNIT TABS Take 1 tablet by mouth daily.      . cholecalciferol (VITAMIN D) 1000 UNITS tablet Take 1,000 Units by mouth daily.    . lansoprazole (PREVACID) 15 MG capsule Take 15 mg by mouth daily.    Marland Kitchen lenalidomide (REVLIMID) 15 MG capsule Take 1 capsule (15 mg total) by mouth daily. 12/01/14 Auth number [id number] adult female not of childbearing potential 28 capsule 0  . potassium chloride SA (K-DUR,KLOR-CON) 20 MEQ tablet Take 20 mEq by mouth 2 (two) times daily.    . valACYclovir (VALTREX) 500 MG tablet Take 500 mg by mouth 2 (two) times daily.    Marland Kitchen warfarin (COUMADIN) 2 MG tablet Take 1 tablet (2 mg total) by mouth daily. 30 tablet 2   No current facility-administered medications for this visit.    SURGICAL HISTORY:  Past Surgical History  Procedure Laterality Date  . Tubal ligation    . Esophagogastroduodenoscopy  04/15/2007    REVIEW OF SYSTEMS:  Constitutional: positive for fatigue Eyes: negative Ears, nose, mouth, throat, and face: negative Respiratory: negative Cardiovascular: negative Gastrointestinal: negative Genitourinary:negative Integument/breast: negative Hematologic/lymphatic: negative Musculoskeletal:negative Neurological: negative Behavioral/Psych: negative Endocrine: negative Allergic/Immunologic: negative   PHYSICAL EXAMINATION: General appearance: alert, cooperative, fatigued and no distress Head: Normocephalic, without obvious abnormality, atraumatic Neck: no adenopathy, no JVD, supple, symmetrical, trachea midline and thyroid not enlarged, symmetric, no tenderness/mass/nodules Lymph nodes: Cervical, supraclavicular, and axillary nodes  normal. Resp: clear to auscultation bilaterally Back: symmetric, no curvature. ROM normal. No CVA  tenderness. Cardio: regular rate and rhythm, S1, S2 normal, no murmur, click, rub or gallop GI: soft, non-tender; bowel sounds normal; no masses,  no organomegaly Extremities: extremities normal, atraumatic, no cyanosis or edema Neurologic: Alert and oriented X 3, normal strength and tone. Normal symmetric reflexes. Normal coordination and gait  ECOG PERFORMANCE STATUS: 1 - Symptomatic but completely ambulatory  There were no vitals taken for this visit.  LABORATORY DATA: Lab Results  Component Value Date   WBC 3.3* 12/08/2014   HGB 11.2* 12/08/2014   HCT 33.9* 12/08/2014   MCV 85.6 12/08/2014   PLT 141* 12/08/2014      Chemistry      Component Value Date/Time   NA 142 11/10/2014 0809   NA 140 04/14/2013 0916   K 3.5 11/10/2014 0809   K 3.8 04/14/2013 0916   CL 105 04/14/2013 0916   CO2 24 11/10/2014 0809   CO2 28 04/14/2013 0916   BUN 11.0 11/10/2014 0809   BUN 10 04/14/2013 0916   CREATININE 0.9 11/10/2014 0809   CREATININE 1.0 04/14/2013 0916      Component Value Date/Time   CALCIUM 8.8 11/10/2014 0809   CALCIUM 9.7 04/14/2013 0916   CALCIUM 9.0 12/18/2009 2256   ALKPHOS 56 11/10/2014 0809   ALKPHOS 42 04/14/2013 0916   AST 23 11/10/2014 0809   AST 23 04/14/2013 0916   ALT 23 11/10/2014 0809   ALT 24 04/14/2013 0916   BILITOT 0.65 11/10/2014 0809   BILITOT 0.4 04/14/2013 0916     ASSESSMENT AND PLAN: This is a very pleasant 64 years old [place] American female who was recently diagnosed with IgA multiple myeloma with significant elevation of IgA over 7000.  She is currently undergoing systemic chemotherapy with Carfilzomib, Cytoxan and dexamethasone and tolerating it fairly well.  she status post 5 cycles followed by autologous peripheral blood stem cell transplant in July of 2015. She was recently started on maintenance Revlimid 10 mg by mouth daily status post 1 months and tolerating her treatment fairly well. I recommended for her to continue her current  maintenance treatment with Revlimid but her dose will be increased to 15 mg by mouth daily. She will come back for follow-up visit in one month for reevaluation and management of any adverse effect of her treatment after repeating myeloma panel She was advised to call immediately she has any concerning symptoms in the interval. The patient voices understanding of current disease status and treatment options and is in agreement with the current care plan.  All questions were answered. The patient knows to call the clinic with any problems, questions or concerns. We can certainly see the patient much sooner if necessary.  Disclaimer: This note was dictated with voice recognition software. Similar sounding words can inadvertently be transcribed and may not be corrected upon review.

## 2014-12-08 NOTE — Telephone Encounter (Signed)
Patient saw Dr. Julien Nordmann today.  She has two questions.  She wants to know if pain medicine effects your blood counts.  Let her know that typically narcotic pain medicine does not effect you blood counts.  It can cause constipation and other side effects, but does not effect blood counts.  The other question is, would going to a spa help her skin.  Let her know that it is quite possible that the spa treatment would help her dry skin, however, with her white blood cell count being low we would not advise her to go to a spa.  Instead we would suggest using moisturizing lotion like aveeno or cetaphil - especially after a shower or before bed.  Also it will help to make sure she stays well hydrated.  She uses dove soap and asked about that.  Let her know that dove soap is very mild and a good choice for her skin. However, most likely it is not antibacterial so keep that in mind.  She states she has some other questions but cannot remember what they are.  Let her know to call us.

## 2014-12-08 NOTE — Telephone Encounter (Signed)
gv and printed appt sched and avs for pt for April  °

## 2014-12-28 ENCOUNTER — Other Ambulatory Visit: Payer: Self-pay | Admitting: Medical Oncology

## 2014-12-28 DIAGNOSIS — C9000 Multiple myeloma not having achieved remission: Secondary | ICD-10-CM

## 2014-12-28 MED ORDER — LENALIDOMIDE 15 MG PO CAPS: 15.0000 mg | ORAL_CAPSULE | Freq: Every day | ORAL | Status: DC

## 2014-12-28 NOTE — Progress Notes (Signed)
revlimid refill sent to [facility]

## 2015-01-05 ENCOUNTER — Other Ambulatory Visit (HOSPITAL_BASED_OUTPATIENT_CLINIC_OR_DEPARTMENT_OTHER): Payer: 59

## 2015-01-05 DIAGNOSIS — C9000 Multiple myeloma not having achieved remission: Secondary | ICD-10-CM

## 2015-01-05 LAB — COMPREHENSIVE METABOLIC PANEL (CC13)
ALK PHOS: 50 U/L (ref 40–150)
ALT: 21 U/L (ref 0–55)
ANION GAP: 10 meq/L (ref 3–11)
AST: 17 U/L (ref 5–34)
Albumin: 3.9 g/dL (ref 3.5–5.0)
BILIRUBIN TOTAL: 0.58 mg/dL (ref 0.20–1.20)
BUN: 13.1 mg/dL (ref 7.0–26.0)
CALCIUM: 9.2 mg/dL (ref 8.4–10.4)
CHLORIDE: 111 meq/L — AB (ref 98–109)
CO2: 23 mEq/L (ref 22–29)
CREATININE: 0.9 mg/dL (ref 0.6–1.1)
EGFR: 77 mL/min/{1.73_m2} — ABNORMAL LOW (ref >90)
Glucose: 86 mg/dl (ref 70–140)
Potassium: 3.5 mEq/L (ref 3.5–5.1)
Sodium: 144 mEq/L (ref 136–145)
Total Protein: 6.8 g/dL (ref 6.4–8.3)

## 2015-01-05 LAB — CBC WITH DIFFERENTIAL/PLATELET
BASO%: 0.7 % (ref 0.0–2.0)
BASOS ABS: 0 10*3/uL (ref 0.0–0.1)
EOS ABS: 0.2 10*3/uL (ref 0.0–0.5)
EOS%: 6.6 % (ref 0.0–7.0)
HEMATOCRIT: 34.3 % — AB (ref 34.8–46.6)
HGB: 11.4 g/dL — ABNORMAL LOW (ref 11.6–15.9)
LYMPH%: 55.4 % — ABNORMAL HIGH (ref 14.0–49.7)
MCH: 28.6 pg (ref 25.1–34.0)
MCHC: 33.2 g/dL (ref 31.5–36.0)
MCV: 86.2 fL (ref 79.5–101.0)
MONO#: 0.3 10*3/uL (ref 0.1–0.9)
MONO%: 10.2 % (ref 0.0–14.0)
NEUT%: 27.1 % — AB (ref 38.4–76.8)
NEUTROS ABS: 0.8 10*3/uL — AB (ref 1.5–6.5)
Platelets: 138 10*3/uL — ABNORMAL LOW (ref 145–400)
RBC: 3.98 10*6/uL (ref 3.70–5.45)
RDW: 15.9 % — ABNORMAL HIGH (ref 11.2–14.5)
WBC: 3.1 10*3/uL — ABNORMAL LOW (ref 3.9–10.3)
lymph#: 1.7 10*3/uL (ref 0.9–3.3)

## 2015-01-05 LAB — LACTATE DEHYDROGENASE (CC13): LDH: 170 U/L (ref 125–245)

## 2015-01-09 LAB — BETA 2 MICROGLOBULIN, SERUM: Beta-2 Microglobulin: 2.09 mg/L (ref <=2.51)

## 2015-01-09 LAB — IGG, IGA, IGM
IGA: 212 mg/dL (ref 69–380)
IGG (IMMUNOGLOBIN G), SERUM: 1030 mg/dL (ref 690–1700)
IGM, SERUM: 22 mg/dL — AB (ref 52–322)

## 2015-01-09 LAB — KAPPA/LAMBDA LIGHT CHAINS
KAPPA LAMBDA RATIO: 1.07 (ref 0.26–1.65)
Kappa free light chain: 2.6 mg/dL — ABNORMAL HIGH (ref 0.33–1.94)
Lambda Free Lght Chn: 2.43 mg/dL (ref 0.57–2.63)

## 2015-01-11 ENCOUNTER — Other Ambulatory Visit: Payer: Self-pay | Admitting: Internal Medicine

## 2015-01-12 ENCOUNTER — Encounter: Payer: Self-pay | Admitting: Internal Medicine

## 2015-01-12 ENCOUNTER — Ambulatory Visit (HOSPITAL_BASED_OUTPATIENT_CLINIC_OR_DEPARTMENT_OTHER): Payer: 59 | Admitting: Internal Medicine

## 2015-01-12 VITALS — BP 131/69 | HR 51 | Temp 97.8°F | Resp 18 | Ht 66.0 in | Wt 148.6 lb

## 2015-01-12 DIAGNOSIS — C9000 Multiple myeloma not having achieved remission: Secondary | ICD-10-CM

## 2015-01-12 NOTE — Progress Notes (Signed)
[facility] [phone]   Fax:(336) [phone]  OFFICE PROGRESS NOTE  [name], MD 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 First dose on 11/15/2013. Status post 5 cycles. 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 3) Maintenance Revlimid 10 mg by mouth daily. Status post 2 months of treatment.   CURRENT THERAPY: Maintenance Revlimid 15 mg by mouth daily. Status post 1 months of treatment.  INTERVAL HISTORY: Victoria Gates 64 y.o. female returns to the clinic today for follow up visit accompanied by her husband. The patient is feeling fine today with no specific complaints. She is tolerating her treatment with maintenance Revlimid fairly well with no significant adverse effects. She denied having any significant fever or chills. She denied having any significant chest pain, shortness of breath, cough or hemoptysis. The patient denied having any significant weight loss or night sweats. She has no nausea or vomiting. She had repeat myeloma panel and she is here today for evaluation and discussion of her lab results.  MEDICAL HISTORY: Past Medical History  Diagnosis Date  . COLONIC POLYPS, HX OF 04/24/2007    hyperplastic only  . OSTEOPOROSIS 04/24/2007  . GERD 09/21/2010  . DM 03/18/2008  . HYPERCHOLESTEROLEMIA 07/03/2009  . ANEMIA-IRON DEFICIENCY 04/24/2007  . ALLERGIC RHINITIS CAUSE UNSPECIFIED 01/02/2009  . ESOPHAGEAL STRICTURE 04/15/2007    s/p dilitation  . HIATAL HERNIA 04/15/2007  . CERVICAL RADICULOPATHY, LEFT 02/01/2010  . 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 Chest pain 03/2012    a. 04/15/2012 Ex MV: Ex time 10 mins, EF 63%, no ischemia/infarct.  Occas PAC's/PVC's.    ALLERGIES:  is allergic to atorvastatin; rosuvastatin; sulfamethoxazole; and  sulfonamide derivatives.  MEDICATIONS:  Current Outpatient Prescriptions  Medication Sig Dispense Refill  . Ascorbic Acid (VITAMIN C) 1000 MG tablet Take 1,000 mg by mouth.    . Calcium 500-125 MG-UNIT TABS Take 1 tablet by mouth daily.      . cholecalciferol (VITAMIN D) 1000 UNITS tablet Take 1,000 Units by mouth daily.    . lansoprazole (PREVACID) 15 MG capsule Take 15 mg by mouth daily.    Marland Kitchen lenalidomide (REVLIMID) 15 MG capsule Take 1 capsule (15 mg total) by mouth daily. Auth number [id number] 3/30/16adult female not of childbearing potential 28 capsule 0  . potassium chloride SA (K-DUR,KLOR-CON) 20 MEQ tablet Take 20 mEq by mouth 2 (two) times daily.    . valACYclovir (VALTREX) 500 MG tablet Take 500 mg by mouth 2 (two) times daily.    Marland Kitchen warfarin (COUMADIN) 2 MG tablet TAKE 1 TABLET (2 MG TOTAL) BY MOUTH DAILY. 30 tablet 2   No current facility-administered medications for this visit.    SURGICAL HISTORY:  Past Surgical History  Procedure Laterality Date  . Tubal ligation    . Esophagogastroduodenoscopy  04/15/2007    REVIEW OF SYSTEMS:  Constitutional: positive for fatigue Eyes: negative Ears, nose, mouth, throat, and face: negative Respiratory: negative Cardiovascular: negative Gastrointestinal: negative Genitourinary:negative Integument/breast: negative Hematologic/lymphatic: negative Musculoskeletal:negative Neurological: negative Behavioral/Psych: negative Endocrine: negative Allergic/Immunologic: negative   PHYSICAL EXAMINATION: General appearance: alert, cooperative, fatigued and no distress Head: Normocephalic, without obvious abnormality, atraumatic Neck: no adenopathy, no JVD, supple, symmetrical, trachea midline and thyroid not enlarged, symmetric, no tenderness/mass/nodules Lymph nodes: Cervical, supraclavicular, and axillary nodes normal. Resp: clear to auscultation bilaterally  Back: symmetric, no curvature. ROM normal. No CVA tenderness. Cardio: regular  rate and rhythm, S1, S2 normal, no murmur, click, rub or gallop GI: soft, non-tender; bowel sounds normal; no masses,  no organomegaly Extremities: extremities normal, atraumatic, no cyanosis or edema Neurologic: Alert and oriented X 3, normal strength and tone. Normal symmetric reflexes. Normal coordination and gait  ECOG PERFORMANCE STATUS: 1 - Symptomatic but completely ambulatory  Blood pressure 131/69, pulse 51, temperature 97.8 F (36.6 C), temperature source Oral, resp. rate 18, height $RemoveBe'5\' 6"'[id number]$  (1.676 m), weight 148 lb 9.6 oz (67.405 kg), SpO2 100 %.  LABORATORY DATA: Lab Results  Component Value Date   WBC 3.1* 01/05/2015   HGB 11.4* 01/05/2015   HCT 34.3* 01/05/2015   MCV 86.2 01/05/2015   PLT 138* 01/05/2015      Chemistry      Component Value Date/Time   NA 144 01/05/2015 0759   NA 140 04/14/2013 0916   K 3.5 01/05/2015 0759   K 3.8 04/14/2013 0916   CL 105 04/14/2013 0916   CO2 23 01/05/2015 0759   CO2 28 04/14/2013 0916   BUN 13.1 01/05/2015 0759   BUN 10 04/14/2013 0916   CREATININE 0.9 01/05/2015 0759   CREATININE 1.0 04/14/2013 0916      Component Value Date/Time   CALCIUM 9.2 01/05/2015 0759   CALCIUM 9.7 04/14/2013 0916   CALCIUM 9.0 12/18/2009 2256   ALKPHOS 50 01/05/2015 0759   ALKPHOS 42 04/14/2013 0916   AST 17 01/05/2015 0759   AST 23 04/14/2013 0916   ALT 21 01/05/2015 0759   ALT 24 04/14/2013 0916   BILITOT 0.58 01/05/2015 0759   BILITOT 0.4 04/14/2013 0916       ASSESSMENT AND PLAN: This is a very pleasant 64 years old [place] American female who was recently diagnosed with IgA multiple myeloma with significant elevation of IgA over 7000.  She is currently undergoing systemic chemotherapy with Carfilzomib, Cytoxan and dexamethasone and tolerating it fairly well.  she status post 5 cycles followed by autologous peripheral blood stem cell transplant in July of 2015. The patient was started on maintenance treatment with Revlimid initially 10 mg  by mouth daily for 2 months and currently undergoing maintenance treatment with Revlimid 15 mg by mouth daily status post 1 months. She is rating her treatment fairly well except for mild neutropenia. Her myeloma panel today showed no evidence for disease progression. I discussed the lab result with the patient and her husband. I recommended for her to continue her current treatment with Revlimid 15 mg by mouth daily. I will continue to monitor her closely with repeat blood work in one month. She was advised to call immediately she has any concerning symptoms in the interval. The patient voices understanding of current disease status and treatment options and is in agreement with the current care plan.  All questions were answered. The patient knows to call the clinic with any problems, questions or concerns. We can certainly see the patient much sooner if necessary.  Disclaimer: This note was dictated with voice recognition software. Similar sounding words can inadvertently be transcribed and may not be corrected upon review.

## 2015-01-23 ENCOUNTER — Other Ambulatory Visit: Payer: Self-pay | Admitting: Medical Oncology

## 2015-01-23 DIAGNOSIS — C9000 Multiple myeloma not having achieved remission: Secondary | ICD-10-CM

## 2015-01-23 MED ORDER — LENALIDOMIDE 15 MG PO CAPS: 15.0000 mg | ORAL_CAPSULE | Freq: Every day | ORAL | Status: DC

## 2015-02-09 ENCOUNTER — Ambulatory Visit (HOSPITAL_BASED_OUTPATIENT_CLINIC_OR_DEPARTMENT_OTHER): Payer: 59 | Admitting: Physician Assistant

## 2015-02-09 ENCOUNTER — Other Ambulatory Visit (HOSPITAL_BASED_OUTPATIENT_CLINIC_OR_DEPARTMENT_OTHER): Payer: 59

## 2015-02-09 ENCOUNTER — Telehealth: Payer: Self-pay | Admitting: Physician Assistant

## 2015-02-09 ENCOUNTER — Encounter: Payer: Self-pay | Admitting: Physician Assistant

## 2015-02-09 VITALS — BP 107/71 | HR 69 | Temp 98.1°F | Resp 18 | Ht 66.0 in | Wt 149.1 lb

## 2015-02-09 DIAGNOSIS — C9000 Multiple myeloma not having achieved remission: Secondary | ICD-10-CM

## 2015-02-09 LAB — CBC WITH DIFFERENTIAL/PLATELET
BASO%: 2.4 % — ABNORMAL HIGH (ref 0.0–2.0)
Basophils Absolute: 0.1 10*3/uL (ref 0.0–0.1)
EOS ABS: 0.2 10*3/uL (ref 0.0–0.5)
EOS%: 8.4 % — AB (ref 0.0–7.0)
HCT: 32.4 % — ABNORMAL LOW (ref 34.8–46.6)
HGB: 10.7 g/dL — ABNORMAL LOW (ref 11.6–15.9)
LYMPH%: 57.8 % — AB (ref 14.0–49.7)
MCH: 28.7 pg (ref 25.1–34.0)
MCHC: 33 g/dL (ref 31.5–36.0)
MCV: 86.9 fL (ref 79.5–101.0)
MONO#: 0.2 10*3/uL (ref 0.1–0.9)
MONO%: 10.7 % (ref 0.0–14.0)
NEUT%: 20.7 % — ABNORMAL LOW (ref 38.4–76.8)
NEUTROS ABS: 0.5 10*3/uL — AB (ref 1.5–6.5)
PLATELETS: 120 10*3/uL — AB (ref 145–400)
RBC: 3.73 10*6/uL (ref 3.70–5.45)
RDW: 18.8 % — AB (ref 11.2–14.5)
WBC: 2.3 10*3/uL — ABNORMAL LOW (ref 3.9–10.3)
lymph#: 1.3 10*3/uL (ref 0.9–3.3)

## 2015-02-09 LAB — COMPREHENSIVE METABOLIC PANEL (CC13)
ALT: 18 U/L (ref 0–55)
ANION GAP: 11 meq/L (ref 3–11)
AST: 19 U/L (ref 5–34)
Albumin: 3.7 g/dL (ref 3.5–5.0)
Alkaline Phosphatase: 45 U/L (ref 40–150)
BILIRUBIN TOTAL: 0.9 mg/dL (ref 0.20–1.20)
BUN: 7 mg/dL (ref 7.0–26.0)
CALCIUM: 8.7 mg/dL (ref 8.4–10.4)
CHLORIDE: 108 meq/L (ref 98–109)
CO2: 25 mEq/L (ref 22–29)
CREATININE: 0.9 mg/dL (ref 0.6–1.1)
EGFR: 77 mL/min/{1.73_m2} — ABNORMAL LOW (ref >90)
Glucose: 84 mg/dl (ref 70–140)
Potassium: 2.9 mEq/L — CL (ref 3.5–5.1)
Sodium: 143 mEq/L (ref 136–145)
Total Protein: 6.2 g/dL — ABNORMAL LOW (ref 6.4–8.3)

## 2015-02-09 LAB — LACTATE DEHYDROGENASE (CC13): LDH: 173 U/L (ref 125–245)

## 2015-02-09 NOTE — Telephone Encounter (Signed)
per pof to sch pt appt-gave pt copy of sch °

## 2015-02-09 NOTE — Progress Notes (Addendum)
[place] [phone]   [phone]  OFFICE PROGRESS NOTE  [name], MD 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 First dose on 11/15/2013. Status post 5 cycles. 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 3) Maintenance Revlimid 10 mg by mouth daily. Status post 2 months of treatment.   CURRENT THERAPY: Maintenance Revlimid 15 mg by mouth daily. Status post 2 months of treatment.  INTERVAL HISTORY: Victoria Gates 64 y.o. female returns to the clinic today for follow up visit accompanied by her husband. The patient is feeling fine today with no specific complaints except for generalized dry skin. She notes dry patches on her abdomen that she first noticed this weekend. Denies any itching associated with these areas. She is tolerating her treatment with maintenance Revlimid fairly well with no significant adverse effects. She denied having any significant fever or chills. She denied having any significant chest pain, shortness of breath, cough or hemoptysis. The patient denied having any significant weight loss or night sweats. She has no nausea or vomiting. She reports occasional days where she'll have more frequent bowel movements but denied frank diarrhea or constipation.  MEDICAL HISTORY: Past Medical History  Diagnosis Date  . COLONIC POLYPS, HX OF 04/24/2007    hyperplastic only  . OSTEOPOROSIS 04/24/2007  . GERD 09/21/2010  . DM 03/18/2008  . HYPERCHOLESTEROLEMIA 07/03/2009  . ANEMIA-IRON DEFICIENCY 04/24/2007  . ALLERGIC RHINITIS CAUSE UNSPECIFIED 01/02/2009  . ESOPHAGEAL STRICTURE 04/15/2007    s/p dilitation  . HIATAL HERNIA 04/15/2007  . CERVICAL RADICULOPATHY, LEFT 02/01/2010  . 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 Chest pain 03/2012    a.  04/15/2012 Ex MV: Ex time 10 mins, EF 63%, no ischemia/infarct.  Occas PAC's/PVC's.    ALLERGIES:  is allergic to atorvastatin; rosuvastatin; sulfamethoxazole; and sulfonamide derivatives.  MEDICATIONS:  Current Outpatient Prescriptions  Medication Sig Dispense Refill  . Ascorbic Acid (VITAMIN C) 1000 MG tablet Take 1,000 mg by mouth.    . Calcium 500-125 MG-UNIT TABS Take 1 tablet by mouth daily.      . cholecalciferol (VITAMIN D) 1000 UNITS tablet Take 1,000 Units by mouth daily.    . lansoprazole (PREVACID) 15 MG capsule Take 15 mg by mouth daily.    Marland Kitchen lenalidomide (REVLIMID) 15 MG capsule Take 1 capsule (15 mg total) by mouth daily. Auth number [id number] adult female not of childbearing potential 28 capsule 0  . valACYclovir (VALTREX) 500 MG tablet Take 500 mg by mouth 2 (two) times daily.    Marland Kitchen warfarin (COUMADIN) 2 MG tablet TAKE 1 TABLET (2 MG TOTAL) BY MOUTH DAILY. 30 tablet 2  . potassium chloride SA (K-DUR,KLOR-CON) 20 MEQ tablet Take 20 mEq by mouth 2 (two) times daily.     No current facility-administered medications for this visit.    SURGICAL HISTORY:  Past Surgical History  Procedure Laterality Date  . Tubal ligation    . Esophagogastroduodenoscopy  04/15/2007    REVIEW OF SYSTEMS:  Constitutional: positive for fatigue Eyes: negative Ears, nose, mouth, throat, and face: negative Respiratory: negative Cardiovascular: negative Gastrointestinal: negative Genitourinary:negative Integument/breast: positive for rash and generalized dry skin with scattered patches of dry skin "rash" Hematologic/lymphatic: negative Musculoskeletal:negative Neurological: negative Behavioral/Psych: negative Endocrine: negative Allergic/Immunologic: negative   PHYSICAL EXAMINATION: General appearance: alert, cooperative, fatigued and  no distress Head: Normocephalic, without obvious abnormality, atraumatic Neck: no adenopathy, no JVD, supple, symmetrical, trachea midline and thyroid not  enlarged, symmetric, no tenderness/mass/nodules Lymph nodes: Cervical, supraclavicular, and axillary nodes normal. Resp: clear to auscultation bilaterally Back: symmetric, no curvature. ROM normal. No CVA tenderness. Cardio: regular rate and rhythm, S1, S2 normal, no murmur, click, rub or gallop GI: soft, non-tender; bowel sounds normal; no masses,  no organomegaly Extremities: extremities normal, atraumatic, no cyanosis or edema Neurologic: Alert and oriented X 3, normal strength and tone. Normal symmetric reflexes. Normal coordination and gait  ECOG PERFORMANCE STATUS: 1 - Symptomatic but completely ambulatory  Blood pressure 107/71, pulse 69, temperature 98.1 F (36.7 C), temperature source Oral, resp. rate 18, height _0  (1.676 m), weight 149 lb 1.6 oz (67.631 kg), SpO2 100 %.  LABORATORY DATA: Lab Results  Component Value Date   WBC 2.3* 02/09/2015   HGB 10.7* 02/09/2015   HCT 32.4* 02/09/2015   MCV 86.9 02/09/2015   PLT 120* 02/09/2015      Chemistry      Component Value Date/Time   NA 143 02/09/2015 0804   NA 140 04/14/2013 0916   K 2.9* 02/09/2015 0804   K 3.8 04/14/2013 0916   CL 105 04/14/2013 0916   CO2 25 02/09/2015 0804   CO2 28 04/14/2013 0916   BUN 7.0 02/09/2015 0804   BUN 10 04/14/2013 0916   CREATININE 0.9 02/09/2015 0804   CREATININE 1.0 04/14/2013 0916      Component Value Date/Time   CALCIUM 8.7 02/09/2015 0804   CALCIUM 9.7 04/14/2013 0916   CALCIUM 9.0 12/18/2009 2256   ALKPHOS 45 02/09/2015 0804   ALKPHOS 42 04/14/2013 0916   AST 19 02/09/2015 0804   AST 23 04/14/2013 0916   ALT 18 02/09/2015 0804   ALT 24 04/14/2013 0916   BILITOT 0.90 02/09/2015 0804   BILITOT 0.4 04/14/2013 0916       ASSESSMENT AND PLAN: This is a very pleasant 64 years old African American female who was recently diagnosed with IgA multiple myeloma with significant elevation of IgA over 7000.  She is currently undergoing systemic chemotherapy with Carfilzomib,  Cytoxan and dexamethasone and tolerating it fairly well.  she status post 5 cycles followed by autologous peripheral blood stem cell transplant in July of 2015. The patient was started on maintenance treatment with Revlimid initially 10 mg by mouth daily for 2 months and currently undergoing maintenance treatment with Revlimid 15 mg by mouth daily status post 2 months. She is rating her treatment fairly well except for moderate neutropenia. Her ANC today is 0.5 with a total white count of 2.3. She is asked to hold her maintenance Revlimid for 1 week and then resume taking minutes Revlimid 15 mg by mouth daily. She is also hypokalemic today with potassium 2.9, prescription for K Dur 40 mEq by mouth daily for the next 7 days was sent her pharmacy of record via E scribe. Her most recent myeloma panel showed no evidence for disease progression. The patient was discussed with and also seen by Dr. Julien Nordmann. We will continue to monitor her closely with repeat blood work in one month. She was advised to call immediately she has any concerning symptoms in the interval. The patient voices understanding of current disease status and treatment options and is in agreement with the current care plan.  All questions were answered. The patient knows to call the clinic with any problems, questions or concerns. We can certainly see  the patient much sooner if necessary.  Carlton Adam, PA-C 02/09/2015  ADDENDUM: Hematology/Oncology Attending: I had a face to face encounter with the patient. I recommended her care plan. This is a very pleasant 64 years old African-American female with history of multiple myeloma status post induction chemotherapy with Carfilzomib, Cytoxan and dexamethasone followed by peripheral blood autologous stem cell transplant and she is currently on maintenance treatment with Revlimid 15 mg by mouth daily. The patient is tolerating her maintenance Revlimid fairly well except for neutropenia  over the last few weeks. I recommended for the patient to hold her treatment with Revlimid for now until recovery of the absolute neutrophil count over 1000. For the hypokalemia, will start the patient on potassium chloride supplements. She would come back for blood work and follow-up visit in one month. The patient was advised to call immediately if she has any concerning symptoms in the interval.  Disclaimer: This note was dictated with voice recognition software. Similar sounding words can inadvertently be transcribed and may be missed upon review. Eilleen Kempf., MD 02/12/2015

## 2015-02-09 NOTE — Patient Instructions (Signed)
Your white count is low. Do not take your Revlimid for the next 7 days. After the 7 days you can resume taking the Revlimid 15 mg by mouth daily Your potassium is low, take the potassium supplement as prescribed 40 mEq by mouth daily for the next 7 days Follow-up in one month

## 2015-02-10 ENCOUNTER — Telehealth: Payer: Self-pay | Admitting: *Deleted

## 2015-02-10 MED ORDER — POTASSIUM CHLORIDE CRYS ER 20 MEQ PO TBCR: EXTENDED_RELEASE_TABLET | ORAL | Status: DC

## 2015-02-10 NOTE — Telephone Encounter (Signed)
PHARMACY DID NOT RECEIVE THE PRESCRIPTION VIA E-SCRIBE. PRESCRIPTION WAS CALLED TO [facility] ON [address]. LEFT PT. A MESSAGE ON HOME VOICE MAIL.

## 2015-02-16 ENCOUNTER — Telehealth: Payer: Self-pay | Admitting: *Deleted

## 2015-02-16 NOTE — Telephone Encounter (Signed)
[facility] faxed Revlimid refill request.  Request to provider's desk/in-basket for review. 

## 2015-02-17 ENCOUNTER — Other Ambulatory Visit: Payer: Self-pay | Admitting: *Deleted

## 2015-02-17 DIAGNOSIS — C9000 Multiple myeloma not having achieved remission: Secondary | ICD-10-CM

## 2015-02-17 MED ORDER — LENALIDOMIDE 15 MG PO CAPS
15.0000 mg | ORAL_CAPSULE | Freq: Every day | ORAL | Status: DC
Start: 2015-02-17 — End: 2015-03-24

## 2015-02-17 NOTE — Telephone Encounter (Signed)
Refill escribed. Survey completed 5/20

## 2015-02-24 ENCOUNTER — Telehealth: Payer: Self-pay | Admitting: Nutrition

## 2015-02-24 NOTE — Telephone Encounter (Signed)
Request received to contact patient's family by phone for some additional options for improving patient's oral intake. Called patient's home and spoke with her husband. Husband reports patient has been.having diarrhea however she has been taking Imodium, which has seemed to help diarrhea. Educated patient's husband provide low fiber diet for patient.  Questions were answered.  Teach back method used.    **Disclaimer: This note was dictated with voice recognition software. Similar sounding words can inadvertently be transcribed and this note may contain transcription errors which may not have been corrected upon publication of note.**

## 2015-03-09 ENCOUNTER — Encounter: Payer: Self-pay | Admitting: Internal Medicine

## 2015-03-09 ENCOUNTER — Other Ambulatory Visit (HOSPITAL_BASED_OUTPATIENT_CLINIC_OR_DEPARTMENT_OTHER): Payer: 59

## 2015-03-09 ENCOUNTER — Telehealth: Payer: Self-pay | Admitting: Internal Medicine

## 2015-03-09 ENCOUNTER — Ambulatory Visit (HOSPITAL_BASED_OUTPATIENT_CLINIC_OR_DEPARTMENT_OTHER): Payer: 59 | Admitting: Internal Medicine

## 2015-03-09 VITALS — BP 123/65 | HR 53 | Temp 98.2°F | Resp 18 | Ht 66.0 in | Wt 143.0 lb

## 2015-03-09 DIAGNOSIS — C9000 Multiple myeloma not having achieved remission: Secondary | ICD-10-CM | POA: Diagnosis not present

## 2015-03-09 DIAGNOSIS — D701 Agranulocytosis secondary to cancer chemotherapy: Secondary | ICD-10-CM | POA: Diagnosis not present

## 2015-03-09 LAB — CBC WITH DIFFERENTIAL/PLATELET
BASO%: 0.3 % (ref 0.0–2.0)
Basophils Absolute: 0 10*3/uL (ref 0.0–0.1)
EOS%: 4.5 % (ref 0.0–7.0)
Eosinophils Absolute: 0.1 10*3/uL (ref 0.0–0.5)
HEMATOCRIT: 35.2 % (ref 34.8–46.6)
HGB: 11.7 g/dL (ref 11.6–15.9)
LYMPH%: 57.3 % — ABNORMAL HIGH (ref 14.0–49.7)
MCH: 29.6 pg (ref 25.1–34.0)
MCHC: 33.2 g/dL (ref 31.5–36.0)
MCV: 89.1 fL (ref 79.5–101.0)
MONO#: 0.3 10*3/uL (ref 0.1–0.9)
MONO%: 13 % (ref 0.0–14.0)
NEUT%: 24.9 % — ABNORMAL LOW (ref 38.4–76.8)
NEUTROS ABS: 0.6 10*3/uL — AB (ref 1.5–6.5)
PLATELETS: 122 10*3/uL — AB (ref 145–400)
RBC: 3.95 10*6/uL (ref 3.70–5.45)
RDW: 18.9 % — AB (ref 11.2–14.5)
WBC: 2.4 10*3/uL — AB (ref 3.9–10.3)
lymph#: 1.4 10*3/uL (ref 0.9–3.3)

## 2015-03-09 LAB — COMPREHENSIVE METABOLIC PANEL (CC13)
ALT: 24 U/L (ref 0–55)
ANION GAP: 8 meq/L (ref 3–11)
AST: 22 U/L (ref 5–34)
Albumin: 4 g/dL (ref 3.5–5.0)
Alkaline Phosphatase: 45 U/L (ref 40–150)
BUN: 7.6 mg/dL (ref 7.0–26.0)
CO2: 28 mEq/L (ref 22–29)
CREATININE: 0.9 mg/dL (ref 0.6–1.1)
Calcium: 9.1 mg/dL (ref 8.4–10.4)
Chloride: 104 mEq/L (ref 98–109)
EGFR: 77 mL/min/{1.73_m2} — ABNORMAL LOW (ref >90)
Glucose: 85 mg/dl (ref 70–140)
Potassium: 3.5 mEq/L (ref 3.5–5.1)
Sodium: 140 mEq/L (ref 136–145)
Total Bilirubin: 1.26 mg/dL — ABNORMAL HIGH (ref 0.20–1.20)
Total Protein: 6.6 g/dL (ref 6.4–8.3)

## 2015-03-09 LAB — LACTATE DEHYDROGENASE (CC13): LDH: 175 U/L (ref 125–245)

## 2015-03-09 NOTE — Progress Notes (Signed)
[place] [phone]   Fax:(336) [phone]  OFFICE PROGRESS NOTE  [name], MD 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 First dose on 11/15/2013. Status post 5 cycles. 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 3) Maintenance Revlimid 10 mg by mouth daily. Status post 2 months of treatment.   CURRENT THERAPY: Maintenance Revlimid 15 mg by mouth daily. Status post 3 months of treatment.  INTERVAL HISTORY: Victoria Gates 64 y.o. female returns to the clinic today for follow up visit accompanied by her husband. The patient is feeling fine today with no specific complaints. She has no significant change since her last visit. She is tolerating her treatment with maintenance Revlimid fairly well with no significant adverse effects. She denied having any significant fever or chills. She denied having any significant chest pain, shortness of breath, cough or hemoptysis. The patient denied having any significant weight loss or night sweats. She has no nausea or vomiting. She had repeat CBC and comprehensive metabolic panel performed earlier today and she is here for evaluation and discussion of her lab results.  MEDICAL HISTORY: Past Medical History  Diagnosis Date  . COLONIC POLYPS, HX OF 04/24/2007    hyperplastic only  . OSTEOPOROSIS 04/24/2007  . GERD 09/21/2010  . DM 03/18/2008  . HYPERCHOLESTEROLEMIA 07/03/2009  . ANEMIA-IRON DEFICIENCY 04/24/2007  . ALLERGIC RHINITIS CAUSE UNSPECIFIED 01/02/2009  . ESOPHAGEAL STRICTURE 04/15/2007    s/p dilitation  . HIATAL HERNIA 04/15/2007  . CERVICAL RADICULOPATHY, LEFT 02/01/2010  . 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 Chest pain 03/2012    a. 04/15/2012 Ex MV: Ex time 10 mins, EF 63%, no ischemia/infarct.  Occas PAC's/PVC's.     ALLERGIES:  is allergic to atorvastatin; rosuvastatin; sulfamethoxazole; and sulfonamide derivatives.  MEDICATIONS:  Current Outpatient Prescriptions  Medication Sig Dispense Refill  . Ascorbic Acid (VITAMIN C) 1000 MG tablet Take 1,000 mg by mouth.    . Calcium 500-125 MG-UNIT TABS Take 1 tablet by mouth daily.      . lansoprazole (PREVACID) 15 MG capsule Take 15 mg by mouth daily.    . potassium chloride SA (K-DUR,KLOR-CON) 20 MEQ tablet TAKE TWO TABLETS DAILY FOR SEVEN DAYS 14 tablet 0  . valACYclovir (VALTREX) 500 MG tablet Take 500 mg by mouth 2 (two) times daily.    Marland Kitchen warfarin (COUMADIN) 2 MG tablet TAKE 1 TABLET (2 MG TOTAL) BY MOUTH DAILY. 30 tablet 2  . cholecalciferol (VITAMIN D) 1000 UNITS tablet Take 1,000 Units by mouth daily.    Marland Kitchen lenalidomide (REVLIMID) 15 MG capsule Take 1 capsule (15 mg total) by mouth daily. Survey completed 5/20 Auth number [id number] adult female not of childbearing potential 28 capsule 0   No current facility-administered medications for this visit.    SURGICAL HISTORY:  Past Surgical History  Procedure Laterality Date  . Tubal ligation    . Esophagogastroduodenoscopy  04/15/2007    REVIEW OF SYSTEMS:  A comprehensive review of systems was negative.   PHYSICAL EXAMINATION: General appearance: alert, cooperative, fatigued and no distress Head: Normocephalic, without obvious abnormality, atraumatic Neck: no adenopathy, no JVD, supple, symmetrical, trachea midline and thyroid not enlarged, symmetric, no tenderness/mass/nodules Lymph nodes: Cervical, supraclavicular, and axillary nodes normal. Resp: clear to auscultation bilaterally Back: symmetric, no curvature. ROM normal. No CVA tenderness. Cardio:  regular rate and rhythm, S1, S2 normal, no murmur, click, rub or gallop GI: soft, non-tender; bowel sounds normal; no masses,  no organomegaly Extremities: extremities normal, atraumatic, no cyanosis or edema Neurologic: Alert and oriented X 3,  normal strength and tone. Normal symmetric reflexes. Normal coordination and gait  ECOG PERFORMANCE STATUS: 1 - Symptomatic but completely ambulatory  Blood pressure 123/65, pulse 53, temperature 98.2 F (36.8 C), temperature source Oral, resp. rate 18, height $RemoveBe'5\' 6"'[id number]$  (1.676 m), weight 143 lb (64.864 kg), SpO2 100 %.  LABORATORY DATA: Lab Results  Component Value Date   WBC 2.4* 03/09/2015   HGB 11.7 03/09/2015   HCT 35.2 03/09/2015   MCV 89.1 03/09/2015   PLT 122* 03/09/2015      Chemistry      Component Value Date/Time   NA 143 02/09/2015 0804   NA 140 04/14/2013 0916   K 2.9* 02/09/2015 0804   K 3.8 04/14/2013 0916   CL 105 04/14/2013 0916   CO2 25 02/09/2015 0804   CO2 28 04/14/2013 0916   BUN 7.0 02/09/2015 0804   BUN 10 04/14/2013 0916   CREATININE 0.9 02/09/2015 0804   CREATININE 1.0 04/14/2013 0916      Component Value Date/Time   CALCIUM 8.7 02/09/2015 0804   CALCIUM 9.7 04/14/2013 0916   CALCIUM 9.0 12/18/2009 2256   ALKPHOS 45 02/09/2015 0804   ALKPHOS 42 04/14/2013 0916   AST 19 02/09/2015 0804   AST 23 04/14/2013 0916   ALT 18 02/09/2015 0804   ALT 24 04/14/2013 0916   BILITOT 0.90 02/09/2015 0804   BILITOT 0.4 04/14/2013 0916       ASSESSMENT AND PLAN: This is a very pleasant 64 years old [place] American female who was recently diagnosed with IgA multiple myeloma with significant elevation of IgA over 7000.  She is currently undergoing systemic chemotherapy with Carfilzomib, Cytoxan and dexamethasone and tolerating it fairly well.  she status post 5 cycles followed by autologous peripheral blood stem cell transplant in July of 2015. The patient was started on maintenance treatment with Revlimid initially 10 mg by mouth daily for 2 months and currently undergoing maintenance treatment with Revlimid 15 mg by mouth daily status post 3 months. She continues to have neutropenia secondary to her treatment. The patient started cycle #4 of her treatment with  Revlimid 15 mg by mouth daily on 03/01/2015. Her myeloma panel today showed no evidence for disease progression. I recommended for her to continue her current treatment with Revlimid for now until we repeat the myeloma panel. I may consider reducing her dose back to 10 mg by mouth daily because of the persistent neutropenia starting from the next cycle. I will see the patient back for follow-up visit in 3 weeks after repeating myeloma panel. She was advised to call immediately she has any concerning symptoms in the interval. The patient voices understanding of current disease status and treatment options and is in agreement with the current care plan.  All questions were answered. The patient knows to call the clinic with any problems, questions or concerns. We can certainly see the patient much sooner if necessary.  Disclaimer: This note was dictated with voice recognition software. Similar sounding words can inadvertently be transcribed and may not be corrected upon review.

## 2015-03-09 NOTE — Telephone Encounter (Signed)
Pt confirmed labs/ov per 06/09 POF, gave pt AVS and Calendar.... KJ °

## 2015-03-21 ENCOUNTER — Telehealth: Payer: Self-pay | Admitting: *Deleted

## 2015-03-21 NOTE — Telephone Encounter (Signed)
THIS REFILL REQUEST FOR REVLIMID WAS PLACED ON DR.MOHAMED'S NURSE'S DESK. 

## 2015-03-24 ENCOUNTER — Telehealth: Payer: Self-pay

## 2015-03-24 ENCOUNTER — Other Ambulatory Visit: Payer: Self-pay | Admitting: *Deleted

## 2015-03-24 DIAGNOSIS — C9000 Multiple myeloma not having achieved remission: Secondary | ICD-10-CM

## 2015-03-24 MED ORDER — LENALIDOMIDE 15 MG PO CAPS: 15.0000 mg | ORAL_CAPSULE | Freq: Every day | ORAL | Status: DC

## 2015-03-24 NOTE — Telephone Encounter (Signed)
revlimid refill request to Dr Julien Nordmann RN

## 2015-03-24 NOTE — Telephone Encounter (Signed)
Survey completed 06/22 Auth number [id number] adult female not of childbearing potential

## 2015-03-27 ENCOUNTER — Telehealth: Payer: Self-pay | Admitting: Medical Oncology

## 2015-03-27 ENCOUNTER — Other Ambulatory Visit (HOSPITAL_BASED_OUTPATIENT_CLINIC_OR_DEPARTMENT_OTHER): Payer: 59

## 2015-03-27 ENCOUNTER — Telehealth: Payer: Self-pay | Admitting: *Deleted

## 2015-03-27 DIAGNOSIS — E876 Hypokalemia: Secondary | ICD-10-CM

## 2015-03-27 DIAGNOSIS — C9000 Multiple myeloma not having achieved remission: Secondary | ICD-10-CM | POA: Diagnosis not present

## 2015-03-27 DIAGNOSIS — D701 Agranulocytosis secondary to cancer chemotherapy: Secondary | ICD-10-CM | POA: Diagnosis not present

## 2015-03-27 LAB — COMPREHENSIVE METABOLIC PANEL (CC13)
ALT: 21 U/L (ref 0–55)
AST: 22 U/L (ref 5–34)
Albumin: 4.3 g/dL (ref 3.5–5.0)
Alkaline Phosphatase: 40 U/L (ref 40–150)
Anion Gap: 10 mEq/L (ref 3–11)
BILIRUBIN TOTAL: 1.15 mg/dL (ref 0.20–1.20)
BUN: 6.4 mg/dL — ABNORMAL LOW (ref 7.0–26.0)
CO2: 26 mEq/L (ref 22–29)
Calcium: 9.1 mg/dL (ref 8.4–10.4)
Chloride: 107 mEq/L (ref 98–109)
Creatinine: 1 mg/dL (ref 0.6–1.1)
EGFR: 71 mL/min/{1.73_m2} — ABNORMAL LOW (ref >90)
Glucose: 83 mg/dl (ref 70–140)
Potassium: 2.9 mEq/L — CL (ref 3.5–5.1)
SODIUM: 144 meq/L (ref 136–145)
Total Protein: 7 g/dL (ref 6.4–8.3)

## 2015-03-27 LAB — CBC WITH DIFFERENTIAL/PLATELET
BASO%: 2.8 % — ABNORMAL HIGH (ref 0.0–2.0)
BASOS ABS: 0.1 10*3/uL (ref 0.0–0.1)
EOS ABS: 0.1 10*3/uL (ref 0.0–0.5)
EOS%: 6.4 % (ref 0.0–7.0)
HCT: 35.8 % (ref 34.8–46.6)
HGB: 11.7 g/dL (ref 11.6–15.9)
LYMPH%: 52 % — ABNORMAL HIGH (ref 14.0–49.7)
MCH: 30 pg (ref 25.1–34.0)
MCHC: 32.7 g/dL (ref 31.5–36.0)
MCV: 91.8 fL (ref 79.5–101.0)
MONO#: 0.2 10*3/uL (ref 0.1–0.9)
MONO%: 7.8 % (ref 0.0–14.0)
NEUT%: 31 % — ABNORMAL LOW (ref 38.4–76.8)
NEUTROS ABS: 0.7 10*3/uL — AB (ref 1.5–6.5)
Platelets: 133 10*3/uL — ABNORMAL LOW (ref 145–400)
RBC: 3.9 10*6/uL (ref 3.70–5.45)
RDW: 19.3 % — AB (ref 11.2–14.5)
WBC: 2.2 10*3/uL — ABNORMAL LOW (ref 3.9–10.3)
lymph#: 1.1 10*3/uL (ref 0.9–3.3)

## 2015-03-27 LAB — LACTATE DEHYDROGENASE (CC13): LDH: 169 U/L (ref 125–245)

## 2015-03-27 MED ORDER — POTASSIUM CHLORIDE CRYS ER 20 MEQ PO TBCR: EXTENDED_RELEASE_TABLET | ORAL | Status: DC

## 2015-03-27 NOTE — Telephone Encounter (Signed)
Left message for pt to call back  °

## 2015-03-27 NOTE — Telephone Encounter (Signed)
Will change dose to 10 mg with the next RX.

## 2015-03-27 NOTE — Addendum Note (Signed)
Addended by: Ardeen Garland on: 03/27/2015 01:16 PM   Modules accepted: Orders

## 2015-03-27 NOTE — Telephone Encounter (Signed)
AT 11:37AM RECEIVED A VOICE MAIL FROM PT. ASKING ABOUT HER REVLIMID DOSE. RETURNED AT CALL TO PT. AT 3:20PM AND INFORMED PT. THAT A MESSAGE FROM [facility] HAS BEEN SENT TO DR.MOHAMED FOR A CLARIFICATION ON PT.'S REVLIMID DOSE.

## 2015-03-27 NOTE — Telephone Encounter (Signed)
PRESCRIPTION RECEIVED AT [facility] IS REVLIMID 15MG . PT.'S HUSBAND STATES THE REVLIMID DOSE HAS BEEN DECREASED TO 10MG . CALL [facility],JANICE, WITH THE CLARIFICATION.

## 2015-03-27 NOTE — Telephone Encounter (Signed)
I told pt to finish her revlimid 15 mg until she sees [place] next week. After he gets lab results he will make decision about dose change.

## 2015-03-27 NOTE — Telephone Encounter (Signed)
Victoria Gates I called in kdur rx to local pharmacy.

## 2015-03-28 NOTE — Telephone Encounter (Addendum)
I called pt and told him that her next dose of revlimid will be 10 mg . I instructed her to keep her appt. Pt notified.I called [facility] and instructed them not to send revlimid 15 mg dose tablets.

## 2015-03-29 LAB — BETA 2 MICROGLOBULIN, SERUM: BETA 2 MICROGLOBULIN: 4.1 mg/L — AB (ref <=2.51)

## 2015-03-29 LAB — IGG, IGA, IGM
IGG (IMMUNOGLOBIN G), SERUM: 828 mg/dL (ref 690–1700)
IgA: 291 mg/dL (ref 69–380)
IgM, Serum: 9 mg/dL — ABNORMAL LOW (ref 52–322)

## 2015-03-29 LAB — KAPPA/LAMBDA LIGHT CHAINS
Kappa free light chain: 2.49 mg/dL — ABNORMAL HIGH (ref 0.33–1.94)
Kappa:Lambda Ratio: 1.19 (ref 0.26–1.65)
Lambda Free Lght Chn: 2.1 mg/dL (ref 0.57–2.63)

## 2015-03-29 NOTE — Telephone Encounter (Signed)
TC from [facility] to clarify change in dosage of Revlimid to 10 mg daily, not 15 mg. Spoke with pharmacist Phonecia (sp) to clarify this.  [facility] to call pt to set up delivery time.

## 2015-03-29 NOTE — Telephone Encounter (Signed)
I left message that we will send rx later.

## 2015-03-30 ENCOUNTER — Other Ambulatory Visit: Payer: Self-pay | Admitting: Medical Oncology

## 2015-03-30 DIAGNOSIS — C9000 Multiple myeloma not having achieved remission: Secondary | ICD-10-CM

## 2015-03-30 MED ORDER — LENALIDOMIDE 10 MG PO CAPS: 10.0000 mg | ORAL_CAPSULE | Freq: Every day | ORAL | Status: DC

## 2015-03-30 NOTE — Progress Notes (Signed)
Faxed revlimid 10 mg rx to [facility]

## 2015-04-05 ENCOUNTER — Encounter: Payer: Self-pay | Admitting: Internal Medicine

## 2015-04-05 ENCOUNTER — Telehealth: Payer: Self-pay | Admitting: Internal Medicine

## 2015-04-05 ENCOUNTER — Ambulatory Visit (HOSPITAL_BASED_OUTPATIENT_CLINIC_OR_DEPARTMENT_OTHER): Payer: 59 | Admitting: Internal Medicine

## 2015-04-05 VITALS — BP 116/68 | HR 83 | Temp 99.4°F | Resp 18 | Ht 66.0 in | Wt 138.8 lb

## 2015-04-05 DIAGNOSIS — C9000 Multiple myeloma not having achieved remission: Secondary | ICD-10-CM

## 2015-04-05 NOTE — Progress Notes (Signed)
[place] Telephone:(336) [phone]   Fax:(336) [phone]  OFFICE PROGRESS NOTE  [name], MD 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 First dose on 11/15/2013. Status post 5 cycles. 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 3) Maintenance Revlimid 10 mg by mouth daily. Status post 2 months of treatment. 4) Maintenance Revlimid 15 mg by mouth daily. Status post 4 months of treatment.   CURRENT THERAPY: Maintenance Revlimid 10 mg by mouth daily.   INTERVAL HISTORY: Victoria Gates 64 y.o. female returns to the clinic today for follow up visit. The patient is feeling fine today with no specific complaints. She has no significant change since her last visit. Her dose of Revlimid was reduced to 10 mg because of the significant neutropenia. She will start the new dose in the next few days. She is tolerating her treatment with maintenance Revlimid fairly well with no other significant adverse effects. She denied having any significant fever or chills. She denied having any significant chest pain, shortness of breath, cough or hemoptysis. The patient denied having any significant weight loss or night sweats. She has no nausea or vomiting. She had repeat myeloma panel performed recently and she is here for evaluation and discussion of her lab results.  MEDICAL HISTORY: Past Medical History  Diagnosis Date  . COLONIC POLYPS, HX OF 04/24/2007    hyperplastic only  . OSTEOPOROSIS 04/24/2007  . GERD 09/21/2010  . DM 03/18/2008  . HYPERCHOLESTEROLEMIA 07/03/2009  . ANEMIA-IRON DEFICIENCY 04/24/2007  . ALLERGIC RHINITIS CAUSE UNSPECIFIED 01/02/2009  . ESOPHAGEAL STRICTURE 04/15/2007    s/p dilitation  . HIATAL HERNIA 04/15/2007  . CERVICAL RADICULOPATHY, LEFT 02/01/2010  .  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 Chest pain 03/2012    a. 04/15/2012 Ex MV: Ex time 10 mins, EF 63%, no ischemia/infarct.  Occas PAC's/PVC's.    ALLERGIES:  is allergic to atorvastatin; rosuvastatin; rosuvastatin calcium; sulfamethoxazole; and sulfonamide derivatives.  MEDICATIONS:  Current Outpatient Prescriptions  Medication Sig Dispense Refill  . Ascorbic Acid (VITAMIN C) 1000 MG tablet Take 1,000 mg by mouth.    . Calcium 500-125 MG-UNIT TABS Take 1 tablet by mouth daily.      . cholecalciferol (VITAMIN D) 1000 UNITS tablet Take 1,000 Units by mouth daily.    . lansoprazole (PREVACID) 15 MG capsule Take 15 mg by mouth daily.    Marland Kitchen lenalidomide (REVLIMID) 10 MG capsule Take 1 capsule (10 mg total) by mouth daily. 28 capsule 0  . valACYclovir (VALTREX) 500 MG tablet Take 500 mg by mouth 2 (two) times daily.    Marland Kitchen warfarin (COUMADIN) 2 MG tablet TAKE 1 TABLET (2 MG TOTAL) BY MOUTH DAILY. 30 tablet 2  . potassium chloride SA (K-DUR,KLOR-CON) 20 MEQ tablet TAKE TWO TABLETS DAILY FOR SEVEN DAYS 14 tablet 0   No current facility-administered medications for this visit.    SURGICAL HISTORY:  Past Surgical History  Procedure Laterality Date  . Tubal ligation    . Esophagogastroduodenoscopy  04/15/2007    REVIEW OF SYSTEMS:  Constitutional: negative Eyes: negative Ears, nose, mouth, throat, and face: negative Respiratory: negative Cardiovascular: negative Gastrointestinal: negative Genitourinary:negative Integument/breast: negative Hematologic/lymphatic: negative Musculoskeletal:negative Neurological: negative Behavioral/Psych: negative Endocrine: negative Allergic/Immunologic: negative   PHYSICAL EXAMINATION: General appearance: alert, cooperative, fatigued and no distress Head: Normocephalic, without obvious  abnormality, atraumatic Neck: no adenopathy, no JVD, supple, symmetrical, trachea midline and thyroid not enlarged, symmetric, no tenderness/mass/nodules Lymph nodes: Cervical,  supraclavicular, and axillary nodes normal. Resp: clear to auscultation bilaterally Back: symmetric, no curvature. ROM normal. No CVA tenderness. Cardio: regular rate and rhythm, S1, S2 normal, no murmur, click, rub or gallop GI: soft, non-tender; bowel sounds normal; no masses,  no organomegaly Extremities: extremities normal, atraumatic, no cyanosis or edema Neurologic: Alert and oriented X 3, normal strength and tone. Normal symmetric reflexes. Normal coordination and gait  ECOG PERFORMANCE STATUS: 1 - Symptomatic but completely ambulatory  Blood pressure 116/68, pulse 83, temperature 99.4 F (37.4 C), temperature source Oral, resp. rate 18, height 5' 6" (1.676 m), weight 138 lb 12.8 oz (62.959 kg), SpO2 100 %.  LABORATORY DATA: Lab Results  Component Value Date   WBC 2.2* 03/27/2015   HGB 11.7 03/27/2015   HCT 35.8 03/27/2015   MCV 91.8 03/27/2015   PLT 133* 03/27/2015      Chemistry      Component Value Date/Time   NA 144 03/27/2015 0805   NA 140 04/14/2013 0916   K 2.9* 03/27/2015 0805   K 3.8 04/14/2013 0916   CL 105 04/14/2013 0916   CO2 26 03/27/2015 0805   CO2 28 04/14/2013 0916   BUN 6.4* 03/27/2015 0805   BUN 10 04/14/2013 0916   CREATININE 1.0 03/27/2015 0805   CREATININE 1.0 04/14/2013 0916      Component Value Date/Time   CALCIUM 9.1 03/27/2015 0805   CALCIUM 9.7 04/14/2013 0916   CALCIUM 9.0 12/18/2009 2256   ALKPHOS 40 03/27/2015 0805   ALKPHOS 42 04/14/2013 0916   AST 22 03/27/2015 0805   AST 23 04/14/2013 0916   ALT 21 03/27/2015 0805   ALT 24 04/14/2013 0916   BILITOT 1.15 03/27/2015 0805   BILITOT 0.4 04/14/2013 0916       ASSESSMENT AND PLAN: This is a very pleasant 64 years old [place] American female who was recently diagnosed with IgA multiple myeloma with significant elevation of IgA over 7000.  She is currently undergoing systemic chemotherapy with Carfilzomib, Cytoxan and dexamethasone and tolerating it fairly well.  she status post 5  cycles followed by autologous peripheral blood stem cell transplant in July of 2015. The patient was started on maintenance treatment with Revlimid initially 10 mg by mouth daily for 2 months and currently undergoing maintenance treatment with Revlimid 15 mg by mouth daily status post 4 months. Her myeloma panel today is unremarkable for any disease progression. I discussed the lab result with the patient today. She continues to have neutropenia secondary to her treatment and I recommended for her to decrease the dose of Revlimid to 10 mg by mouth daily. She is expected to start the first dose of this treatment in the next few days. I will see the patient back for follow-up visit in 4 weeks with repeat blood work. She was advised to call immediately she has any concerning symptoms in the interval. The patient voices understanding of current disease status and treatment options and is in agreement with the current care plan.  All questions were answered. The patient knows to call the clinic with any problems, questions or concerns. We can certainly see the patient much sooner if necessary.  Disclaimer: This note was dictated with voice recognition software. Similar sounding words can inadvertently be transcribed and may not be corrected upon review.

## 2015-04-05 NOTE — Telephone Encounter (Signed)
per pof to sch pt appt-gave pt copy of avs °

## 2015-04-06 ENCOUNTER — Other Ambulatory Visit: Payer: Self-pay | Admitting: Internal Medicine

## 2015-04-25 ENCOUNTER — Other Ambulatory Visit: Payer: Self-pay | Admitting: Medical Oncology

## 2015-04-25 DIAGNOSIS — C9000 Multiple myeloma not having achieved remission: Secondary | ICD-10-CM

## 2015-04-25 MED ORDER — LENALIDOMIDE 10 MG PO CAPS
10.0000 mg | ORAL_CAPSULE | Freq: Every day | ORAL | Status: DC
Start: 2015-04-25 — End: 2015-06-15

## 2015-04-25 NOTE — Progress Notes (Signed)
RX faxed to [facility]

## 2015-04-27 LAB — HM DIABETES EYE EXAM

## 2015-05-03 ENCOUNTER — Ambulatory Visit (HOSPITAL_BASED_OUTPATIENT_CLINIC_OR_DEPARTMENT_OTHER): Payer: 59 | Admitting: Internal Medicine

## 2015-05-03 ENCOUNTER — Ambulatory Visit (HOSPITAL_BASED_OUTPATIENT_CLINIC_OR_DEPARTMENT_OTHER): Payer: 59

## 2015-05-03 ENCOUNTER — Telehealth: Payer: Self-pay | Admitting: Medical Oncology

## 2015-05-03 ENCOUNTER — Telehealth: Payer: Self-pay | Admitting: Internal Medicine

## 2015-05-03 ENCOUNTER — Other Ambulatory Visit (HOSPITAL_BASED_OUTPATIENT_CLINIC_OR_DEPARTMENT_OTHER): Payer: 59

## 2015-05-03 ENCOUNTER — Encounter: Payer: Self-pay | Admitting: Internal Medicine

## 2015-05-03 VITALS — BP 111/65 | HR 42 | Temp 98.2°F | Resp 17 | Ht 66.0 in | Wt 140.4 lb

## 2015-05-03 DIAGNOSIS — C9000 Multiple myeloma not having achieved remission: Secondary | ICD-10-CM | POA: Diagnosis not present

## 2015-05-03 DIAGNOSIS — Z5189 Encounter for other specified aftercare: Secondary | ICD-10-CM | POA: Diagnosis not present

## 2015-05-03 DIAGNOSIS — D709 Neutropenia, unspecified: Secondary | ICD-10-CM

## 2015-05-03 LAB — COMPREHENSIVE METABOLIC PANEL (CC13)
ALT: 27 U/L (ref 0–55)
ANION GAP: 6 meq/L (ref 3–11)
AST: 22 U/L (ref 5–34)
Albumin: 4 g/dL (ref 3.5–5.0)
Alkaline Phosphatase: 46 U/L (ref 40–150)
BUN: 7.3 mg/dL (ref 7.0–26.0)
CO2: 28 mEq/L (ref 22–29)
CREATININE: 1 mg/dL (ref 0.6–1.1)
Calcium: 9.1 mg/dL (ref 8.4–10.4)
Chloride: 107 mEq/L (ref 98–109)
EGFR: 73 mL/min/{1.73_m2} — ABNORMAL LOW (ref >90)
Glucose: 84 mg/dl (ref 70–140)
Potassium: 2.6 mEq/L — CL (ref 3.5–5.1)
SODIUM: 142 meq/L (ref 136–145)
TOTAL PROTEIN: 6.5 g/dL (ref 6.4–8.3)
Total Bilirubin: 0.83 mg/dL (ref 0.20–1.20)

## 2015-05-03 LAB — CBC WITH DIFFERENTIAL/PLATELET
BASO%: 1 % (ref 0.0–2.0)
Basophils Absolute: 0 10*3/uL (ref 0.0–0.1)
EOS ABS: 0.2 10*3/uL (ref 0.0–0.5)
EOS%: 8.9 % — ABNORMAL HIGH (ref 0.0–7.0)
HEMATOCRIT: 33 % — AB (ref 34.8–46.6)
HEMOGLOBIN: 11.1 g/dL — AB (ref 11.6–15.9)
LYMPH%: 60.4 % — AB (ref 14.0–49.7)
MCH: 31.3 pg (ref 25.1–34.0)
MCHC: 33.6 g/dL (ref 31.5–36.0)
MCV: 93 fL (ref 79.5–101.0)
MONO#: 0.2 10*3/uL (ref 0.1–0.9)
MONO%: 10.4 % (ref 0.0–14.0)
NEUT#: 0.4 10*3/uL — CL (ref 1.5–6.5)
NEUT%: 19.3 % — ABNORMAL LOW (ref 38.4–76.8)
Platelets: 106 10*3/uL — ABNORMAL LOW (ref 145–400)
RBC: 3.55 10*6/uL — ABNORMAL LOW (ref 3.70–5.45)
RDW: 16.3 % — AB (ref 11.2–14.5)
WBC: 2 10*3/uL — ABNORMAL LOW (ref 3.9–10.3)
lymph#: 1.2 10*3/uL (ref 0.9–3.3)

## 2015-05-03 LAB — LACTATE DEHYDROGENASE (CC13): LDH: 166 U/L (ref 125–245)

## 2015-05-03 MED ORDER — POTASSIUM CHLORIDE CRYS ER 20 MEQ PO TBCR: 20.0000 meq | EXTENDED_RELEASE_TABLET | Freq: Two times a day (BID) | ORAL | Status: DC

## 2015-05-03 MED ORDER — TBO-FILGRASTIM 300 MCG/0.5ML ~~LOC~~ SOSY
300.0000 ug | PREFILLED_SYRINGE | Freq: Once | SUBCUTANEOUS | Status: AC
  Administered 2015-05-03: 300 ug via SUBCUTANEOUS
  Filled 2015-05-03: qty 0.5

## 2015-05-03 NOTE — Telephone Encounter (Signed)
Called in and pt notified.

## 2015-05-03 NOTE — Telephone Encounter (Signed)
-----   Message from Curt Bears, MD sent at 05/03/2015 10:40 AM EDT ----- Call patient with the result and order K Dur 40 meq po qd X 10 days.

## 2015-05-03 NOTE — Telephone Encounter (Signed)
Pt confirmed labs/ov per 08/03 POF, gave pt avs and calendar... KJ °

## 2015-05-03 NOTE — Patient Instructions (Signed)

## 2015-05-03 NOTE — Progress Notes (Signed)
[place] Telephone:(336) [phone]   Fax:(336) [phone]  OFFICE PROGRESS NOTE  Victoria Shin, MD 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 First dose on 11/15/2013. Status post 5 cycles. 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 3) Maintenance Revlimid 10 mg by mouth daily. Status post 2 months of treatment. 4) Maintenance Revlimid 15 mg by mouth daily. Status post 4 months of treatment.   CURRENT THERAPY: Maintenance Revlimid 10 mg by mouth daily. Status post one month.  INTERVAL HISTORY: Victoria Gates 64 y.o. female returns to the clinic today for follow up visit accompanied by her husband. The patient is feeling fine today with no specific complaints. She has no significant change since her last visit except that she is not taking Valtrex anymore as this was discontinued by Dr. Clydene Laming. Her dose of Revlimid was reduced to 10 mg because of the significant neutropenia. This was started a month ago. She is tolerating her treatment with maintenance Revlimid fairly well with no other significant adverse effects. She denied having any significant fever or chills. She denied having any significant chest pain, shortness of breath, cough or hemoptysis. The patient denied having any significant weight loss or night sweats. She has no nausea or vomiting. She had repeat CBC, comprehensive metabolic panel and LDH performed earlier today and she is here for evaluation and discussion of her lab results.  MEDICAL HISTORY: Past Medical History  Diagnosis Date  . COLONIC POLYPS, HX OF 04/24/2007    hyperplastic only  . OSTEOPOROSIS 04/24/2007  . GERD 09/21/2010  . DM 03/18/2008  . HYPERCHOLESTEROLEMIA 07/03/2009  . ANEMIA-IRON DEFICIENCY 04/24/2007  . ALLERGIC RHINITIS CAUSE  UNSPECIFIED 01/02/2009  . ESOPHAGEAL STRICTURE 04/15/2007    s/p dilitation  . HIATAL HERNIA 04/15/2007  . CERVICAL RADICULOPATHY, LEFT 02/01/2010  . 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 Chest pain 03/2012    a. 04/15/2012 Ex MV: Ex time 10 mins, EF 63%, no ischemia/infarct.  Occas PAC's/PVC's.    ALLERGIES:  is allergic to atorvastatin; rosuvastatin; rosuvastatin calcium; sulfamethoxazole; and sulfonamide derivatives.  MEDICATIONS:  Current Outpatient Prescriptions  Medication Sig Dispense Refill  . Ascorbic Acid (VITAMIN C) 1000 MG tablet Take 1,000 mg by mouth.    . Calcium 500-125 MG-UNIT TABS Take 1 tablet by mouth daily.      . cholecalciferol (VITAMIN D) 1000 UNITS tablet Take 1,000 Units by mouth daily.    . lansoprazole (PREVACID) 15 MG capsule Take 15 mg by mouth daily.    Marland Kitchen lenalidomide (REVLIMID) 10 MG capsule Take 1 capsule (10 mg total) by mouth daily. 28 capsule 0  . potassium chloride SA (K-DUR,KLOR-CON) 20 MEQ tablet TAKE TWO TABLETS DAILY FOR SEVEN DAYS 14 tablet 0  . valACYclovir (VALTREX) 500 MG tablet Take 500 mg by mouth 2 (two) times daily.    Marland Kitchen warfarin (COUMADIN) 2 MG tablet TAKE 1 TABLET (2 MG TOTAL) BY MOUTH DAILY. 30 tablet 2   No current facility-administered medications for this visit.    SURGICAL HISTORY:  Past Surgical History  Procedure Laterality Date  . Tubal ligation    . Esophagogastroduodenoscopy  04/15/2007    REVIEW OF SYSTEMS:  Constitutional: negative Eyes: negative Ears, nose, mouth, throat, and face: negative Respiratory: negative Cardiovascular: negative Gastrointestinal: negative Genitourinary:negative Integument/breast: negative Hematologic/lymphatic: negative Musculoskeletal:negative Neurological: negative  Behavioral/Psych: negative Endocrine: negative Allergic/Immunologic: negative   PHYSICAL EXAMINATION: General appearance: alert, cooperative, fatigued and no distress Head: Normocephalic, without obvious abnormality, atraumatic Neck: no  adenopathy, no JVD, supple, symmetrical, trachea midline and thyroid not enlarged, symmetric, no tenderness/mass/nodules Lymph nodes: Cervical, supraclavicular, and axillary nodes normal. Resp: clear to auscultation bilaterally Back: symmetric, no curvature. ROM normal. No CVA tenderness. Cardio: regular rate and rhythm, S1, S2 normal, no murmur, click, rub or gallop GI: soft, non-tender; bowel sounds normal; no masses,  no organomegaly Extremities: extremities normal, atraumatic, no cyanosis or edema Neurologic: Alert and oriented X 3, normal strength and tone. Normal symmetric reflexes. Normal coordination and gait  ECOG PERFORMANCE STATUS: 1 - Symptomatic but completely ambulatory  Blood pressure 111/65, pulse 42, temperature 98.2 F (36.8 C), temperature source Oral, resp. rate 17, height $RemoveBe'5\' 6"'dIGmoLFbK$  (1.676 m), weight 140 lb 6.4 oz (63.685 kg), SpO2 100 %.  LABORATORY DATA: Lab Results  Component Value Date   WBC 2.0* 05/03/2015   HGB 11.1* 05/03/2015   HCT 33.0* 05/03/2015   MCV 93.0 05/03/2015   PLT 106* 05/03/2015      Chemistry      Component Value Date/Time   NA 144 03/27/2015 0805   NA 140 04/14/2013 0916   K 2.9* 03/27/2015 0805   K 3.8 04/14/2013 0916   CL 105 04/14/2013 0916   CO2 26 03/27/2015 0805   CO2 28 04/14/2013 0916   BUN 6.4* 03/27/2015 0805   BUN 10 04/14/2013 0916   CREATININE 1.0 03/27/2015 0805   CREATININE 1.0 04/14/2013 0916      Component Value Date/Time   CALCIUM 9.1 03/27/2015 0805   CALCIUM 9.7 04/14/2013 0916   CALCIUM 9.0 12/18/2009 2256   ALKPHOS 40 03/27/2015 0805   ALKPHOS 42 04/14/2013 0916   AST 22 03/27/2015 0805   AST 23 04/14/2013 0916   ALT 21 03/27/2015 0805   ALT 24 04/14/2013 0916   BILITOT 1.15 03/27/2015 0805   BILITOT 0.4 04/14/2013 0916       ASSESSMENT AND PLAN: This is a very pleasant 64 years old [place] American female who was recently diagnosed with IgA multiple myeloma with significant elevation of IgA over 7000.   She is currently undergoing systemic chemotherapy with Carfilzomib, Cytoxan and dexamethasone and tolerating it fairly well.  she status post 5 cycles followed by autologous peripheral blood stem cell transplant in July of 2015. The patient was started on maintenance treatment with Revlimid initially 10 mg by mouth daily for 2 months and currently undergoing maintenance treatment with Revlimid 15 mg by mouth daily status post 4 months. She is currently on Revlimid 10 mg by mouth daily status post 1 month. Unfortunately her absolute neutrophil count is still low. I recommended for the patient to hold her treatment with Revlimid for now for at least 1 week until recovery of her neutrophil count. I will give the patient a dose of Granix 300 g subcutaneously today because of the neutropenia. We will have repeat CBC in one week for evaluation of her counts before resuming treatment with Revlimid. She will come back for follow-up visit in one month for reevaluation. She was advised to call immediately she has any concerning symptoms in the interval. The patient voices understanding of current disease status and treatment options and is in agreement with the current care plan.  All questions were answered. The patient knows to call the clinic with any problems, questions or concerns. We can certainly see the patient much  sooner if necessary.  Disclaimer: This note was dictated with voice recognition software. Similar sounding words can inadvertently be transcribed and may not be corrected upon review.

## 2015-05-03 NOTE — Progress Notes (Signed)
Quick Note:  Call patient with the result and order K Dur 40 meq po qd X 10 days. ______ 

## 2015-05-10 ENCOUNTER — Ambulatory Visit (HOSPITAL_BASED_OUTPATIENT_CLINIC_OR_DEPARTMENT_OTHER): Payer: 59

## 2015-05-10 ENCOUNTER — Other Ambulatory Visit: Payer: Self-pay | Admitting: Internal Medicine

## 2015-05-10 ENCOUNTER — Telehealth: Payer: Self-pay | Admitting: *Deleted

## 2015-05-10 ENCOUNTER — Telehealth: Payer: Self-pay | Admitting: Internal Medicine

## 2015-05-10 ENCOUNTER — Other Ambulatory Visit (HOSPITAL_BASED_OUTPATIENT_CLINIC_OR_DEPARTMENT_OTHER): Payer: 59

## 2015-05-10 VITALS — BP 139/65 | HR 70 | Temp 98.4°F

## 2015-05-10 DIAGNOSIS — C9000 Multiple myeloma not having achieved remission: Secondary | ICD-10-CM

## 2015-05-10 DIAGNOSIS — D709 Neutropenia, unspecified: Secondary | ICD-10-CM

## 2015-05-10 DIAGNOSIS — Z5189 Encounter for other specified aftercare: Secondary | ICD-10-CM | POA: Diagnosis not present

## 2015-05-10 LAB — CBC WITH DIFFERENTIAL/PLATELET
BASO%: 1.6 % (ref 0.0–2.0)
BASOS ABS: 0.1 10*3/uL (ref 0.0–0.1)
EOS%: 6.4 % (ref 0.0–7.0)
Eosinophils Absolute: 0.2 10*3/uL (ref 0.0–0.5)
HCT: 34.4 % — ABNORMAL LOW (ref 34.8–46.6)
HGB: 11.3 g/dL — ABNORMAL LOW (ref 11.6–15.9)
LYMPH%: 66 % — ABNORMAL HIGH (ref 14.0–49.7)
MCH: 31.3 pg (ref 25.1–34.0)
MCHC: 32.8 g/dL (ref 31.5–36.0)
MCV: 95.3 fL (ref 79.5–101.0)
MONO#: 0.5 10*3/uL (ref 0.1–0.9)
MONO%: 13 % (ref 0.0–14.0)
NEUT%: 13 % — ABNORMAL LOW (ref 38.4–76.8)
NEUTROS ABS: 0.5 10*3/uL — AB (ref 1.5–6.5)
PLATELETS: 116 10*3/uL — AB (ref 145–400)
RBC: 3.61 10*6/uL — AB (ref 3.70–5.45)
RDW: 16.6 % — ABNORMAL HIGH (ref 11.2–14.5)
WBC: 3.8 10*3/uL — AB (ref 3.9–10.3)
lymph#: 2.5 10*3/uL (ref 0.9–3.3)

## 2015-05-10 MED ORDER — TBO-FILGRASTIM 300 MCG/0.5ML ~~LOC~~ SOSY
300.0000 ug | PREFILLED_SYRINGE | Freq: Once | SUBCUTANEOUS | Status: AC
  Administered 2015-05-10: 300 ug via SUBCUTANEOUS
  Filled 2015-05-10: qty 0.5

## 2015-05-10 NOTE — Telephone Encounter (Signed)
Appointments made and Thayer Headings will have her get a new avs today

## 2015-05-10 NOTE — Telephone Encounter (Signed)
Labs reviewed with MD, called pt lmovm with instructions per MD- Hold Revlimid x 1 week, return to clinic for granix x2days. Return to clinic in 1week to recheck labs.  POF to scheduling. Request pt call back to confirm message received.

## 2015-05-10 NOTE — Telephone Encounter (Signed)
VM message from patient stating she received message re: injection appt and holding revlimid.

## 2015-05-11 ENCOUNTER — Ambulatory Visit (HOSPITAL_BASED_OUTPATIENT_CLINIC_OR_DEPARTMENT_OTHER): Payer: 59

## 2015-05-11 VITALS — BP 104/65 | HR 56 | Temp 98.4°F

## 2015-05-11 DIAGNOSIS — C9000 Multiple myeloma not having achieved remission: Secondary | ICD-10-CM | POA: Diagnosis not present

## 2015-05-11 DIAGNOSIS — Z5189 Encounter for other specified aftercare: Secondary | ICD-10-CM

## 2015-05-11 DIAGNOSIS — D709 Neutropenia, unspecified: Secondary | ICD-10-CM | POA: Diagnosis not present

## 2015-05-11 MED ORDER — TBO-FILGRASTIM 300 MCG/0.5ML ~~LOC~~ SOSY
300.0000 ug | PREFILLED_SYRINGE | Freq: Once | SUBCUTANEOUS | Status: AC
  Administered 2015-05-11: 300 ug via SUBCUTANEOUS
  Filled 2015-05-11: qty 0.5

## 2015-05-16 ENCOUNTER — Other Ambulatory Visit (HOSPITAL_BASED_OUTPATIENT_CLINIC_OR_DEPARTMENT_OTHER): Payer: 59

## 2015-05-16 ENCOUNTER — Telehealth: Payer: Self-pay | Admitting: *Deleted

## 2015-05-16 DIAGNOSIS — C9000 Multiple myeloma not having achieved remission: Secondary | ICD-10-CM | POA: Diagnosis not present

## 2015-05-16 DIAGNOSIS — D709 Neutropenia, unspecified: Secondary | ICD-10-CM | POA: Diagnosis not present

## 2015-05-16 LAB — COMPREHENSIVE METABOLIC PANEL (CC13)
ALT: 84 U/L — AB (ref 0–55)
AST: 62 U/L — ABNORMAL HIGH (ref 5–34)
Albumin: 4.2 g/dL (ref 3.5–5.0)
Alkaline Phosphatase: 71 U/L (ref 40–150)
Anion Gap: 8 mEq/L (ref 3–11)
BUN: 14.8 mg/dL (ref 7.0–26.0)
CO2: 24 meq/L (ref 22–29)
Calcium: 9.3 mg/dL (ref 8.4–10.4)
Chloride: 108 mEq/L (ref 98–109)
Creatinine: 1 mg/dL (ref 0.6–1.1)
EGFR: 65 mL/min/{1.73_m2} — AB (ref >90)
Glucose: 88 mg/dl (ref 70–140)
Potassium: 3.8 mEq/L (ref 3.5–5.1)
SODIUM: 141 meq/L (ref 136–145)
TOTAL PROTEIN: 6.7 g/dL (ref 6.4–8.3)
Total Bilirubin: 0.55 mg/dL (ref 0.20–1.20)

## 2015-05-16 LAB — CBC WITH DIFFERENTIAL/PLATELET
BASO%: 2 % (ref 0.0–2.0)
Basophils Absolute: 0.1 10*3/uL (ref 0.0–0.1)
EOS%: 4.2 % (ref 0.0–7.0)
Eosinophils Absolute: 0.2 10*3/uL (ref 0.0–0.5)
HCT: 35.1 % (ref 34.8–46.6)
HEMOGLOBIN: 11.6 g/dL (ref 11.6–15.9)
LYMPH%: 50.1 % — AB (ref 14.0–49.7)
MCH: 31.3 pg (ref 25.1–34.0)
MCHC: 33 g/dL (ref 31.5–36.0)
MCV: 94.6 fL (ref 79.5–101.0)
MONO#: 0.4 10*3/uL (ref 0.1–0.9)
MONO%: 10.7 % (ref 0.0–14.0)
NEUT%: 33 % — ABNORMAL LOW (ref 38.4–76.8)
NEUTROS ABS: 1.3 10*3/uL — AB (ref 1.5–6.5)
Platelets: 129 10*3/uL — ABNORMAL LOW (ref 145–400)
RBC: 3.71 10*6/uL (ref 3.70–5.45)
RDW: 16.2 % — AB (ref 11.2–14.5)
WBC: 4 10*3/uL (ref 3.9–10.3)
lymph#: 2 10*3/uL (ref 0.9–3.3)

## 2015-05-16 NOTE — Telephone Encounter (Signed)
ARE LABS OK FOR PT. TO RETURN TO WORK?

## 2015-05-16 NOTE — Telephone Encounter (Signed)
Per Victoria Gates it is okay to to work English as a second [employer]. Labs left at [facility] desk for pick up today.

## 2015-05-17 ENCOUNTER — Other Ambulatory Visit: Payer: 59

## 2015-05-17 ENCOUNTER — Telehealth: Payer: Self-pay | Admitting: Medical Oncology

## 2015-05-17 NOTE — Telephone Encounter (Signed)
I left a message that per [facility], pt may restart revlimid.

## 2015-06-07 ENCOUNTER — Ambulatory Visit (HOSPITAL_BASED_OUTPATIENT_CLINIC_OR_DEPARTMENT_OTHER): Payer: 59 | Admitting: Internal Medicine

## 2015-06-07 ENCOUNTER — Telehealth: Payer: Self-pay | Admitting: Internal Medicine

## 2015-06-07 ENCOUNTER — Other Ambulatory Visit (HOSPITAL_BASED_OUTPATIENT_CLINIC_OR_DEPARTMENT_OTHER): Payer: 59

## 2015-06-07 ENCOUNTER — Encounter: Payer: Self-pay | Admitting: Internal Medicine

## 2015-06-07 VITALS — BP 104/62 | HR 54 | Temp 98.2°F | Resp 18 | Ht 66.0 in | Wt 137.6 lb

## 2015-06-07 DIAGNOSIS — D709 Neutropenia, unspecified: Secondary | ICD-10-CM

## 2015-06-07 DIAGNOSIS — C9000 Multiple myeloma not having achieved remission: Secondary | ICD-10-CM

## 2015-06-07 LAB — CBC WITH DIFFERENTIAL/PLATELET
BASO%: 1 % (ref 0.0–2.0)
Basophils Absolute: 0 10*3/uL (ref 0.0–0.1)
EOS ABS: 0.1 10*3/uL (ref 0.0–0.5)
EOS%: 3 % (ref 0.0–7.0)
HEMATOCRIT: 33.4 % — AB (ref 34.8–46.6)
HEMOGLOBIN: 11 g/dL — AB (ref 11.6–15.9)
LYMPH#: 1.3 10*3/uL (ref 0.9–3.3)
LYMPH%: 56.4 % — ABNORMAL HIGH (ref 14.0–49.7)
MCH: 30.8 pg (ref 25.1–34.0)
MCHC: 32.8 g/dL (ref 31.5–36.0)
MCV: 94.1 fL (ref 79.5–101.0)
MONO#: 0.3 10*3/uL (ref 0.1–0.9)
MONO%: 13 % (ref 0.0–14.0)
NEUT%: 26.6 % — ABNORMAL LOW (ref 38.4–76.8)
NEUTROS ABS: 0.6 10*3/uL — AB (ref 1.5–6.5)
Platelets: 157 10*3/uL (ref 145–400)
RBC: 3.55 10*6/uL — ABNORMAL LOW (ref 3.70–5.45)
RDW: 14.9 % — AB (ref 11.2–14.5)
WBC: 2.4 10*3/uL — AB (ref 3.9–10.3)

## 2015-06-07 LAB — COMPREHENSIVE METABOLIC PANEL (CC13)
ALBUMIN: 3.8 g/dL (ref 3.5–5.0)
ALK PHOS: 63 U/L (ref 40–150)
ALT: 30 U/L (ref 0–55)
AST: 20 U/L (ref 5–34)
Anion Gap: 7 mEq/L (ref 3–11)
BUN: 8.9 mg/dL (ref 7.0–26.0)
CALCIUM: 9.4 mg/dL (ref 8.4–10.4)
CO2: 28 mEq/L (ref 22–29)
Chloride: 107 mEq/L (ref 98–109)
Creatinine: 1 mg/dL (ref 0.6–1.1)
EGFR: 73 mL/min/{1.73_m2} — ABNORMAL LOW (ref >90)
Glucose: 87 mg/dl (ref 70–140)
Potassium: 4 mEq/L (ref 3.5–5.1)
Sodium: 142 mEq/L (ref 136–145)
Total Bilirubin: 0.87 mg/dL (ref 0.20–1.20)
Total Protein: 6.4 g/dL (ref 6.4–8.3)

## 2015-06-07 LAB — LACTATE DEHYDROGENASE (CC13): LDH: 135 U/L (ref 125–245)

## 2015-06-07 NOTE — Progress Notes (Signed)
[place] Telephone:(336) [phone]   Fax:(336) [phone]  OFFICE PROGRESS NOTE  [name], MD 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 First dose on 11/15/2013. Status post 5 cycles. 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 3) Maintenance Revlimid 10 mg by mouth daily. Status post 2 months of treatment. 4) Maintenance Revlimid 15 mg by mouth daily. Status post 4 months of treatment.   CURRENT THERAPY: Maintenance Revlimid 10 mg by mouth daily. Status post 2 months.  INTERVAL HISTORY: Victoria Gates 64 y.o. female returns to the clinic today for follow up visit accompanied by her husband. The patient is feeling fine today with no specific complaints. She is tolerating her treatment with maintenance Revlimid fairly well with no other significant adverse effects. She denied having any significant fever or chills. She denied having any significant chest pain, shortness of breath, cough or hemoptysis. The patient denied having any significant weight loss or night sweats. She has no nausea or vomiting. She had repeat CBC, comprehensive metabolic panel and LDH performed earlier today and she is here for evaluation and discussion of her lab results.  MEDICAL HISTORY: Past Medical History  Diagnosis Date  . COLONIC POLYPS, HX OF 04/24/2007    hyperplastic only  . OSTEOPOROSIS 04/24/2007  . GERD 09/21/2010  . DM 03/18/2008  . HYPERCHOLESTEROLEMIA 07/03/2009  . ANEMIA-IRON DEFICIENCY 04/24/2007  . ALLERGIC RHINITIS CAUSE UNSPECIFIED 01/02/2009  . ESOPHAGEAL STRICTURE 04/15/2007    s/p dilitation  . HIATAL HERNIA 04/15/2007  . CERVICAL RADICULOPATHY, LEFT 02/01/2010  . 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 Chest pain 03/2012    a. 04/15/2012 Ex MV: Ex time 10 mins, EF 63%, no  ischemia/infarct.  Occas PAC's/PVC's.    ALLERGIES:  is allergic to atorvastatin; rosuvastatin; rosuvastatin calcium; sulfamethoxazole; and sulfonamide derivatives.  MEDICATIONS:  Current Outpatient Prescriptions  Medication Sig Dispense Refill  . Ascorbic Acid (VITAMIN C) 1000 MG tablet Take 1,000 mg by mouth.    . Calcium 500-125 MG-UNIT TABS Take 1 tablet by mouth daily.      . cholecalciferol (VITAMIN D) 1000 UNITS tablet Take 1,000 Units by mouth daily.    . lansoprazole (PREVACID) 15 MG capsule Take 15 mg by mouth daily.    Marland Kitchen lenalidomide (REVLIMID) 10 MG capsule Take 1 capsule (10 mg total) by mouth daily. 28 capsule 0  . warfarin (COUMADIN) 2 MG tablet TAKE 1 TABLET (2 MG TOTAL) BY MOUTH DAILY. 30 tablet 2  . potassium chloride SA (K-DUR,KLOR-CON) 20 MEQ tablet Take 1 tablet (20 mEq total) by mouth 2 (two) times daily. (Patient not taking: Reported on 06/07/2015) 20 tablet 0   No current facility-administered medications for this visit.    SURGICAL HISTORY:  Past Surgical History  Procedure Laterality Date  . Tubal ligation    . Esophagogastroduodenoscopy  04/15/2007    REVIEW OF SYSTEMS:  Constitutional: negative Eyes: negative Ears, nose, mouth, throat, and face: negative Respiratory: negative Cardiovascular: negative Gastrointestinal: negative Genitourinary:negative Integument/breast: negative Hematologic/lymphatic: negative Musculoskeletal:negative Neurological: negative Behavioral/Psych: negative Endocrine: negative Allergic/Immunologic: negative   PHYSICAL EXAMINATION: General appearance: alert, cooperative, fatigued and no distress Head: Normocephalic, without obvious abnormality, atraumatic Neck: no adenopathy, no JVD, supple, symmetrical, trachea midline and thyroid not enlarged, symmetric, no tenderness/mass/nodules Lymph nodes: Cervical, supraclavicular, and axillary nodes normal. Resp: clear to  auscultation bilaterally Back: symmetric, no curvature. ROM  normal. No CVA tenderness. Cardio: regular rate and rhythm, S1, S2 normal, no murmur, click, rub or gallop GI: soft, non-tender; bowel sounds normal; no masses,  no organomegaly Extremities: extremities normal, atraumatic, no cyanosis or edema Neurologic: Alert and oriented X 3, normal strength and tone. Normal symmetric reflexes. Normal coordination and gait  ECOG PERFORMANCE STATUS: 1 - Symptomatic but completely ambulatory  Blood pressure 104/62, pulse 54, temperature 98.2 F (36.8 C), temperature source Oral, resp. rate 18, height $RemoveBe'5\' 6"'OvKChCAjx$  (1.676 m), weight 137 lb 9.6 oz (62.415 kg), SpO2 100 %.  LABORATORY DATA: Lab Results  Component Value Date   WBC 2.4* 06/07/2015   HGB 11.0* 06/07/2015   HCT 33.4* 06/07/2015   MCV 94.1 06/07/2015   PLT 157 06/07/2015      Chemistry      Component Value Date/Time   NA 141 05/16/2015 1042   NA 140 04/14/2013 0916   K 3.8 05/16/2015 1042   K 3.8 04/14/2013 0916   CL 105 04/14/2013 0916   CO2 24 05/16/2015 1042   CO2 28 04/14/2013 0916   BUN 14.8 05/16/2015 1042   BUN 10 04/14/2013 0916   CREATININE 1.0 05/16/2015 1042   CREATININE 1.0 04/14/2013 0916      Component Value Date/Time   CALCIUM 9.3 05/16/2015 1042   CALCIUM 9.7 04/14/2013 0916   CALCIUM 9.0 12/18/2009 2256   ALKPHOS 71 05/16/2015 1042   ALKPHOS 42 04/14/2013 0916   AST 62* 05/16/2015 1042   AST 23 04/14/2013 0916   ALT 84* 05/16/2015 1042   ALT 24 04/14/2013 0916   BILITOT 0.55 05/16/2015 1042   BILITOT 0.4 04/14/2013 0916       ASSESSMENT AND PLAN: This is a very pleasant 64 years old [place] American female who was recently diagnosed with IgA multiple myeloma with significant elevation of IgA over 7000.  She is currently undergoing systemic chemotherapy with Carfilzomib, Cytoxan and dexamethasone and tolerating it fairly well.  she status post 5 cycles followed by autologous peripheral blood stem cell transplant in July of 2015. The patient was started on  maintenance treatment with Revlimid initially 10 mg by mouth daily for 2 months and currently undergoing maintenance treatment with Revlimid 15 mg by mouth daily status post 4 months. She is currently on Revlimid 10 mg by mouth daily status post 2 months. U She continues to have persistent neutropenia. I recommended for the patient to hold her treatment with Revlimid for 1 week. She will come back for follow-up visit in one month for reevaluation with repeat CBC, complaints metabolic panel and LDH. She was advised to call immediately she has any concerning symptoms in the interval. The patient voices understanding of current disease status and treatment options and is in agreement with the current care plan.  All questions were answered. The patient knows to call the clinic with any problems, questions or concerns. We can certainly see the patient much sooner if necessary.  Disclaimer: This note was dictated with voice recognition software. Similar sounding words can inadvertently be transcribed and may not be corrected upon review.

## 2015-06-07 NOTE — Telephone Encounter (Signed)
Appointments made and avs printed for patient °

## 2015-06-15 ENCOUNTER — Telehealth: Payer: Self-pay | Admitting: *Deleted

## 2015-06-15 DIAGNOSIS — C9000 Multiple myeloma not having achieved remission: Secondary | ICD-10-CM

## 2015-06-15 MED ORDER — LENALIDOMIDE 10 MG PO CAPS: 10.0000 mg | ORAL_CAPSULE | Freq: Every day | ORAL | Status: DC

## 2015-06-15 NOTE — Telephone Encounter (Signed)
Revlimid refill done. Held revlimid 9/7-9/13. Restarted it back yesterday .

## 2015-06-15 NOTE — Addendum Note (Signed)
Addended by: Ardeen Garland on: 06/15/2015 11:16 AM   Modules accepted: Orders

## 2015-06-15 NOTE — Telephone Encounter (Signed)
"  I need Dr. Julien Nordmann to call .  I resumed the Revlimid and have eleven pills left and need a refill."

## 2015-06-19 ENCOUNTER — Telehealth: Payer: Self-pay | Admitting: Medical Oncology

## 2015-06-19 ENCOUNTER — Other Ambulatory Visit: Payer: Self-pay | Admitting: Medical Oncology

## 2015-06-19 DIAGNOSIS — C9000 Multiple myeloma not having achieved remission: Secondary | ICD-10-CM

## 2015-06-19 NOTE — Telephone Encounter (Signed)
Faxed prior auth to [facility]

## 2015-06-20 ENCOUNTER — Telehealth: Payer: Self-pay | Admitting: Medical Oncology

## 2015-06-20 NOTE — Telephone Encounter (Signed)
Called patient to notify her to expect a call from [place] [facility]

## 2015-06-20 NOTE — Telephone Encounter (Signed)
I contacted [facility] re PA -approved $30 copay . [facility] staff will call pt.

## 2015-07-01 ENCOUNTER — Other Ambulatory Visit: Payer: Self-pay | Admitting: Internal Medicine

## 2015-07-04 ENCOUNTER — Telehealth: Payer: Self-pay | Admitting: Medical Oncology

## 2015-07-04 NOTE — Telephone Encounter (Signed)
noted 

## 2015-07-05 ENCOUNTER — Other Ambulatory Visit (HOSPITAL_BASED_OUTPATIENT_CLINIC_OR_DEPARTMENT_OTHER): Payer: 59

## 2015-07-05 ENCOUNTER — Encounter: Payer: Self-pay | Admitting: Internal Medicine

## 2015-07-05 ENCOUNTER — Telehealth: Payer: Self-pay | Admitting: Internal Medicine

## 2015-07-05 ENCOUNTER — Ambulatory Visit (HOSPITAL_BASED_OUTPATIENT_CLINIC_OR_DEPARTMENT_OTHER): Payer: 59 | Admitting: Internal Medicine

## 2015-07-05 VITALS — BP 95/65 | HR 71 | Temp 98.9°F | Resp 18 | Ht 66.0 in | Wt 135.4 lb

## 2015-07-05 DIAGNOSIS — C9000 Multiple myeloma not having achieved remission: Secondary | ICD-10-CM | POA: Diagnosis not present

## 2015-07-05 DIAGNOSIS — R0981 Nasal congestion: Secondary | ICD-10-CM | POA: Diagnosis not present

## 2015-07-05 DIAGNOSIS — C9001 Multiple myeloma in remission: Secondary | ICD-10-CM

## 2015-07-05 LAB — CBC WITH DIFFERENTIAL/PLATELET
BASO%: 1.5 % (ref 0.0–2.0)
BASOS ABS: 0.1 10*3/uL (ref 0.0–0.1)
EOS ABS: 0.2 10*3/uL (ref 0.0–0.5)
EOS%: 5 % (ref 0.0–7.0)
HCT: 34.3 % — ABNORMAL LOW (ref 34.8–46.6)
HGB: 11.2 g/dL — ABNORMAL LOW (ref 11.6–15.9)
LYMPH%: 49.9 % — AB (ref 14.0–49.7)
MCH: 30.3 pg (ref 25.1–34.0)
MCHC: 32.7 g/dL (ref 31.5–36.0)
MCV: 92.7 fL (ref 79.5–101.0)
MONO#: 0.5 10*3/uL (ref 0.1–0.9)
MONO%: 14.4 % — ABNORMAL HIGH (ref 0.0–14.0)
NEUT%: 29.2 % — AB (ref 38.4–76.8)
NEUTROS ABS: 1 10*3/uL — AB (ref 1.5–6.5)
PLATELETS: 129 10*3/uL — AB (ref 145–400)
RBC: 3.7 10*6/uL (ref 3.70–5.45)
RDW: 13.2 % (ref 11.2–14.5)
WBC: 3.4 10*3/uL — AB (ref 3.9–10.3)
lymph#: 1.7 10*3/uL (ref 0.9–3.3)

## 2015-07-05 LAB — COMPREHENSIVE METABOLIC PANEL (CC13)
ALBUMIN: 3.5 g/dL (ref 3.5–5.0)
ALK PHOS: 180 U/L — AB (ref 40–150)
ALT: 179 U/L — ABNORMAL HIGH (ref 0–55)
ANION GAP: 8 meq/L (ref 3–11)
AST: 90 U/L — ABNORMAL HIGH (ref 5–34)
BILIRUBIN TOTAL: 0.55 mg/dL (ref 0.20–1.20)
BUN: 12.5 mg/dL (ref 7.0–26.0)
CO2: 26 meq/L (ref 22–29)
Calcium: 9.6 mg/dL (ref 8.4–10.4)
Chloride: 107 mEq/L (ref 98–109)
Creatinine: 1 mg/dL (ref 0.6–1.1)
EGFR: 73 mL/min/{1.73_m2} — AB (ref >90)
Glucose: 84 mg/dl (ref 70–140)
Potassium: 4.1 mEq/L (ref 3.5–5.1)
Sodium: 141 mEq/L (ref 136–145)
TOTAL PROTEIN: 6.9 g/dL (ref 6.4–8.3)

## 2015-07-05 LAB — LACTATE DEHYDROGENASE (CC13): LDH: 221 U/L (ref 125–245)

## 2015-07-05 NOTE — Addendum Note (Signed)
Addended by: Ardeen Garland on: 07/05/2015 08:42 AM   Modules accepted: Medications

## 2015-07-05 NOTE — Telephone Encounter (Signed)
Gave patient avs report and appointments for November.  °

## 2015-07-05 NOTE — Progress Notes (Signed)
[name] Telephone:(336) [phone]   Fax:(336) [phone]  OFFICE PROGRESS NOTE  [name], MD 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 First dose on 11/15/2013. Status post 5 cycles. 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 3) Maintenance Revlimid 10 mg by mouth daily. Status post 2 months of treatment. 4) Maintenance Revlimid 15 mg by mouth daily. Status post 4 months of treatment.   CURRENT THERAPY: Maintenance Revlimid 10 mg by mouth daily. Status post 3 months.  INTERVAL HISTORY: Victoria Gates 64 y.o. female returns to the clinic today for follow up visit accompanied by her husband. The patient is feeling fine today with no specific complaints except for nasal congestion started few days ago. She did not take any medications. She is tolerating her treatment with maintenance Revlimid fairly well with no other significant adverse effects. She denied having any significant fever or chills. She denied having any significant chest pain, shortness of breath, cough or hemoptysis. The patient denied having any significant weight loss or night sweats. She has no nausea or vomiting. She had repeat CBC, comprehensive metabolic panel and LDH performed earlier today and she is here for evaluation and discussion of her lab results.  MEDICAL HISTORY: Past Medical History  Diagnosis Date  . COLONIC POLYPS, HX OF 04/24/2007    hyperplastic only  . OSTEOPOROSIS 04/24/2007  . GERD 09/21/2010  . DM 03/18/2008  . HYPERCHOLESTEROLEMIA 07/03/2009  . ANEMIA-IRON DEFICIENCY 04/24/2007  . ALLERGIC RHINITIS CAUSE UNSPECIFIED 01/02/2009  . ESOPHAGEAL STRICTURE 04/15/2007    s/p dilitation  . HIATAL HERNIA 04/15/2007  . CERVICAL RADICULOPATHY, LEFT 02/01/2010  . 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 Chest pain 03/2012    a. 04/15/2012 Ex MV: Ex time 10 mins, EF 63%, no ischemia/infarct.  Occas PAC's/PVC's.    ALLERGIES:  is allergic to atorvastatin; rosuvastatin; rosuvastatin calcium; sulfamethoxazole; and sulfonamide derivatives.  MEDICATIONS:  Current Outpatient Prescriptions  Medication Sig Dispense Refill  . Ascorbic Acid (VITAMIN C) 1000 MG tablet Take 1,000 mg by mouth.    . Calcium 500-125 MG-UNIT TABS Take 1 tablet by mouth daily.      . cholecalciferol (VITAMIN D) 1000 UNITS tablet Take 1,000 Units by mouth daily.    . lansoprazole (PREVACID) 15 MG capsule Take 15 mg by mouth daily.    Marland Kitchen lenalidomide (REVLIMID) 10 MG capsule Take 1 capsule (10 mg total) by mouth daily. 28 capsule 0  . potassium chloride SA (K-DUR,KLOR-CON) 20 MEQ tablet Take 1 tablet (20 mEq total) by mouth 2 (two) times daily. (Patient not taking: Reported on 06/07/2015) 20 tablet 0  . warfarin (COUMADIN) 2 MG tablet TAKE 1 TABLET BY MOUTH EVERY DAY 30 tablet 2   No current facility-administered medications for this visit.    SURGICAL HISTORY:  Past Surgical History  Procedure Laterality Date  . Tubal ligation    . Esophagogastroduodenoscopy  04/15/2007    REVIEW OF SYSTEMS:  A comprehensive review of systems was negative except for: Ears, nose, mouth, throat, and face: positive for sore throat and Nasal congestion   PHYSICAL EXAMINATION: General appearance: alert, cooperative, fatigued and no distress Head: Normocephalic, without obvious abnormality, atraumatic Neck: no adenopathy, no JVD, supple, symmetrical, trachea midline and thyroid not enlarged, symmetric, no tenderness/mass/nodules Lymph nodes: Cervical, supraclavicular, and axillary nodes  normal. Resp: clear to auscultation bilaterally Back: symmetric, no curvature. ROM normal. No CVA tenderness. Cardio: regular rate and rhythm, S1, S2 normal, no murmur, click, rub or gallop GI: soft, non-tender; bowel sounds normal; no masses,  no  organomegaly Extremities: extremities normal, atraumatic, no cyanosis or edema Neurologic: Alert and oriented X 3, normal strength and tone. Normal symmetric reflexes. Normal coordination and gait  ECOG PERFORMANCE STATUS: 1 - Symptomatic but completely ambulatory  Blood pressure 95/65, pulse 71, temperature 98.9 F (37.2 C), temperature source Oral, resp. rate 18, height $RemoveBe'5\' 6"'FSAcelbzz$  (1.676 m), weight 135 lb 6.4 oz (61.417 kg), SpO2 96 %.  LABORATORY DATA: Lab Results  Component Value Date   WBC 3.4* 07/05/2015   HGB 11.2* 07/05/2015   HCT 34.3* 07/05/2015   MCV 92.7 07/05/2015   PLT 129* 07/05/2015      Chemistry      Component Value Date/Time   NA 142 06/07/2015 0803   NA 140 04/14/2013 0916   K 4.0 06/07/2015 0803   K 3.8 04/14/2013 0916   CL 105 04/14/2013 0916   CO2 28 06/07/2015 0803   CO2 28 04/14/2013 0916   BUN 8.9 06/07/2015 0803   BUN 10 04/14/2013 0916   CREATININE 1.0 06/07/2015 0803   CREATININE 1.0 04/14/2013 0916      Component Value Date/Time   CALCIUM 9.4 06/07/2015 0803   CALCIUM 9.7 04/14/2013 0916   CALCIUM 9.0 12/18/2009 2256   ALKPHOS 63 06/07/2015 0803   ALKPHOS 42 04/14/2013 0916   AST 20 06/07/2015 0803   AST 23 04/14/2013 0916   ALT 30 06/07/2015 0803   ALT 24 04/14/2013 0916   BILITOT 0.87 06/07/2015 0803   BILITOT 0.4 04/14/2013 0916       ASSESSMENT AND PLAN: This is a very pleasant 64 years old [place] American female who was recently diagnosed with IgA multiple myeloma with significant elevation of IgA over 7000.  She is currently undergoing systemic chemotherapy with Carfilzomib, Cytoxan and dexamethasone and tolerating it fairly well.  she status post 5 cycles followed by autologous peripheral blood stem cell transplant in July of 2015. The patient was started on maintenance treatment with Revlimid initially 10 mg by mouth daily for 2 months and currently undergoing maintenance treatment with Revlimid 15 mg by mouth daily status post 4  months. She is currently on Revlimid 10 mg by mouth daily status post 3 months.  Her absolute neutrophil count is still low but better than before. I recommended for the patient to continue her current treatment with Revlimid. For the nasal congestion she was advised to take Claritin when necessary. She will come back for follow-up visit in one month for reevaluation with repeat myeloma panel. She was advised to call immediately she has any concerning symptoms in the interval. The patient voices understanding of current disease status and treatment options and is in agreement with the current care plan.  All questions were answered. The patient knows to call the clinic with any problems, questions or concerns. We can certainly see the patient much sooner if necessary.  Disclaimer: This note was dictated with voice recognition software. Similar sounding words can inadvertently be transcribed and may not be corrected upon review.

## 2015-07-17 ENCOUNTER — Other Ambulatory Visit: Payer: Self-pay | Admitting: Medical Oncology

## 2015-07-17 ENCOUNTER — Telehealth: Payer: Self-pay | Admitting: *Deleted

## 2015-07-17 DIAGNOSIS — C9000 Multiple myeloma not having achieved remission: Secondary | ICD-10-CM

## 2015-07-17 MED ORDER — LENALIDOMIDE 10 MG PO CAPS: 10.0000 mg | ORAL_CAPSULE | Freq: Every day | ORAL | Status: DC

## 2015-07-17 NOTE — Telephone Encounter (Signed)
Called [phone] after call lost.  Mr. Givhan reports [facility].  Collaborative nurse notified.

## 2015-08-02 ENCOUNTER — Other Ambulatory Visit (HOSPITAL_BASED_OUTPATIENT_CLINIC_OR_DEPARTMENT_OTHER): Payer: 59

## 2015-08-02 DIAGNOSIS — C9001 Multiple myeloma in remission: Secondary | ICD-10-CM

## 2015-08-02 LAB — CBC WITH DIFFERENTIAL/PLATELET
BASO%: 1.5 % (ref 0.0–2.0)
BASOS ABS: 0 10*3/uL (ref 0.0–0.1)
EOS%: 7.1 % — AB (ref 0.0–7.0)
Eosinophils Absolute: 0.2 10*3/uL (ref 0.0–0.5)
HEMATOCRIT: 34.7 % — AB (ref 34.8–46.6)
HEMOGLOBIN: 11.5 g/dL — AB (ref 11.6–15.9)
LYMPH#: 1.3 10*3/uL (ref 0.9–3.3)
LYMPH%: 49.4 % (ref 14.0–49.7)
MCH: 30.2 pg (ref 25.1–34.0)
MCHC: 33.1 g/dL (ref 31.5–36.0)
MCV: 91.1 fL (ref 79.5–101.0)
MONO#: 0.3 10*3/uL (ref 0.1–0.9)
MONO%: 11.9 % (ref 0.0–14.0)
NEUT#: 0.8 10*3/uL — ABNORMAL LOW (ref 1.5–6.5)
NEUT%: 30.1 % — ABNORMAL LOW (ref 38.4–76.8)
PLATELETS: 146 10*3/uL (ref 145–400)
RBC: 3.81 10*6/uL (ref 3.70–5.45)
RDW: 13.9 % (ref 11.2–14.5)
WBC: 2.7 10*3/uL — ABNORMAL LOW (ref 3.9–10.3)

## 2015-08-02 LAB — LACTATE DEHYDROGENASE (CC13): LDH: 125 U/L (ref 125–245)

## 2015-08-02 LAB — COMPREHENSIVE METABOLIC PANEL (CC13)
ALBUMIN: 3.6 g/dL (ref 3.5–5.0)
ALK PHOS: 71 U/L (ref 40–150)
ALT: 21 U/L (ref 0–55)
ANION GAP: 5 meq/L (ref 3–11)
AST: 17 U/L (ref 5–34)
BUN: 10.2 mg/dL (ref 7.0–26.0)
CALCIUM: 9.1 mg/dL (ref 8.4–10.4)
CO2: 27 mEq/L (ref 22–29)
CREATININE: 0.9 mg/dL (ref 0.6–1.1)
Chloride: 107 mEq/L (ref 98–109)
EGFR: 81 mL/min/{1.73_m2} — ABNORMAL LOW (ref >90)
Glucose: 87 mg/dl (ref 70–140)
Potassium: 3.6 mEq/L (ref 3.5–5.1)
Sodium: 139 mEq/L (ref 136–145)
Total Bilirubin: 0.68 mg/dL (ref 0.20–1.20)
Total Protein: 6.7 g/dL (ref 6.4–8.3)

## 2015-08-04 LAB — KAPPA/LAMBDA LIGHT CHAINS
KAPPA FREE LGHT CHN: 3.63 mg/dL — AB (ref 0.33–1.94)
Kappa:Lambda Ratio: 1.23 (ref 0.26–1.65)
LAMBDA FREE LGHT CHN: 2.95 mg/dL — AB (ref 0.57–2.63)

## 2015-08-04 LAB — BETA 2 MICROGLOBULIN, SERUM: Beta-2 Microglobulin: 1.84 mg/L (ref <=2.51)

## 2015-08-04 LAB — IGG, IGA, IGM
IGA: 589 mg/dL — AB (ref 69–380)
IgG (Immunoglobin G), Serum: 976 mg/dL (ref 690–1700)
IgM, Serum: 7 mg/dL — ABNORMAL LOW (ref 52–322)

## 2015-08-07 ENCOUNTER — Telehealth: Payer: Self-pay | Admitting: Internal Medicine

## 2015-08-07 ENCOUNTER — Encounter: Payer: Self-pay | Admitting: Internal Medicine

## 2015-08-07 ENCOUNTER — Ambulatory Visit (HOSPITAL_BASED_OUTPATIENT_CLINIC_OR_DEPARTMENT_OTHER): Payer: 59 | Admitting: Internal Medicine

## 2015-08-07 ENCOUNTER — Ambulatory Visit (HOSPITAL_BASED_OUTPATIENT_CLINIC_OR_DEPARTMENT_OTHER): Payer: 59

## 2015-08-07 VITALS — BP 115/73 | HR 65 | Temp 98.5°F | Resp 18 | Ht 66.0 in | Wt 138.0 lb

## 2015-08-07 DIAGNOSIS — D701 Agranulocytosis secondary to cancer chemotherapy: Secondary | ICD-10-CM | POA: Diagnosis not present

## 2015-08-07 DIAGNOSIS — Z23 Encounter for immunization: Secondary | ICD-10-CM

## 2015-08-07 DIAGNOSIS — C9000 Multiple myeloma not having achieved remission: Secondary | ICD-10-CM

## 2015-08-07 MED ORDER — INFLUENZA VAC SPLIT QUAD 0.5 ML IM SUSY
0.5000 mL | PREFILLED_SYRINGE | Freq: Once | INTRAMUSCULAR | Status: AC
  Administered 2015-08-07: 0.5 mL via INTRAMUSCULAR
  Filled 2015-08-07: qty 0.5

## 2015-08-07 NOTE — Telephone Encounter (Signed)
Gave patient avs report and appointments for December  °

## 2015-08-07 NOTE — Progress Notes (Signed)
[place] Telephone:([phone]   Fax:(336) [phone]  OFFICE PROGRESS NOTE  [name], MD 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 First dose on 11/15/2013. Status post 5 cycles. 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 3) Maintenance Revlimid 10 mg by mouth daily. Status post 2 months of treatment. 4) Maintenance Revlimid 15 mg by mouth daily. Status post 4 months of treatment.   CURRENT THERAPY: Maintenance Revlimid 10 mg by mouth daily. Status post 4 months.  INTERVAL HISTORY: Victoria Gates 64 y.o. female returns to the clinic today for follow up visit accompanied by her husband. The patient is feeling fine today with no specific complaints. She is tolerating her treatment with maintenance Revlimid fairly well with no other significant adverse effects. She denied having any significant fever or chills. She denied having any significant chest pain, shortness of breath, cough or hemoptysis. The patient denied having any significant weight loss or night sweats. She has no nausea or vomiting. She had repeat myeloma panel performed recently and she is here for evaluation and discussion of her lab results.  MEDICAL HISTORY: Past Medical History  Diagnosis Date  . COLONIC POLYPS, HX OF 04/24/2007    hyperplastic only  . OSTEOPOROSIS 04/24/2007  . GERD 09/21/2010  . DM 03/18/2008  . HYPERCHOLESTEROLEMIA 07/03/2009  . ANEMIA-IRON DEFICIENCY 04/24/2007  . ALLERGIC RHINITIS CAUSE UNSPECIFIED 01/02/2009  . ESOPHAGEAL STRICTURE 04/15/2007    s/p dilitation  . HIATAL HERNIA 04/15/2007  . CERVICAL RADICULOPATHY, LEFT 02/01/2010  . 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 Chest pain 03/2012    a. 04/15/2012 Ex MV: Ex time 10 mins, EF 63%, no ischemia/infarct.  Occas PAC's/PVC's.     ALLERGIES:  is allergic to atorvastatin; rosuvastatin; rosuvastatin calcium; sulfamethoxazole; and sulfonamide derivatives.  MEDICATIONS:  Current Outpatient Prescriptions  Medication Sig Dispense Refill  . Ascorbic Acid (VITAMIN C) 1000 MG tablet Take 1,000 mg by mouth.    . Calcium 500-125 MG-UNIT TABS Take 1 tablet by mouth daily.      . cholecalciferol (VITAMIN D) 1000 UNITS tablet Take 1,000 Units by mouth daily.    . lansoprazole (PREVACID) 15 MG capsule Take 15 mg by mouth daily.    Marland Kitchen lenalidomide (REVLIMID) 10 MG capsule Take 1 capsule (10 mg total) by mouth daily. 28 capsule 0  . warfarin (COUMADIN) 2 MG tablet TAKE 1 TABLET BY MOUTH EVERY DAY 30 tablet 2  . potassium chloride SA (K-DUR,KLOR-CON) 20 MEQ tablet Take 1 tablet (20 mEq total) by mouth 2 (two) times daily. (Patient not taking: Reported on 08/07/2015) 20 tablet 0   No current facility-administered medications for this visit.    SURGICAL HISTORY:  Past Surgical History  Procedure Laterality Date  . Tubal ligation    . Esophagogastroduodenoscopy  04/15/2007    REVIEW OF SYSTEMS:  Constitutional: negative Eyes: negative Ears, nose, mouth, throat, and face: negative Respiratory: negative Cardiovascular: negative Gastrointestinal: negative Genitourinary:negative Integument/breast: negative Hematologic/lymphatic: negative Musculoskeletal:negative Neurological: negative Behavioral/Psych: negative Endocrine: negative Allergic/Immunologic: negative   PHYSICAL EXAMINATION: General appearance: alert, cooperative, fatigued and no distress Head: Normocephalic, without obvious abnormality, atraumatic Neck: no adenopathy, no JVD, supple, symmetrical, trachea midline and thyroid not enlarged, symmetric, no tenderness/mass/nodules Lymph nodes: Cervical, supraclavicular, and axillary nodes normal. Resp: clear to auscultation bilaterally Back: symmetric, no curvature. ROM  normal. No CVA tenderness. Cardio: regular  rate and rhythm, S1, S2 normal, no murmur, click, rub or gallop GI: soft, non-tender; bowel sounds normal; no masses,  no organomegaly Extremities: extremities normal, atraumatic, no cyanosis or edema Neurologic: Alert and oriented X 3, normal strength and tone. Normal symmetric reflexes. Normal coordination and gait  ECOG PERFORMANCE STATUS: 1 - Symptomatic but completely ambulatory  Blood pressure 115/73, pulse 65, temperature 98.5 F (36.9 C), temperature source Oral, resp. rate 18, height _0  (1.676 m), weight 138 lb (62.596 kg), SpO2 100 %.  LABORATORY DATA: Lab Results  Component Value Date   WBC 2.7* 08/02/2015   HGB 11.5* 08/02/2015   HCT 34.7* 08/02/2015   MCV 91.1 08/02/2015   PLT 146 08/02/2015      Chemistry      Component Value Date/Time   NA 139 08/02/2015 1045   NA 140 04/14/2013 0916   K 3.6 08/02/2015 1045   K 3.8 04/14/2013 0916   CL 105 04/14/2013 0916   CO2 27 08/02/2015 1045   CO2 28 04/14/2013 0916   BUN 10.2 08/02/2015 1045   BUN 10 04/14/2013 0916   CREATININE 0.9 08/02/2015 1045   CREATININE 1.0 04/14/2013 0916      Component Value Date/Time   CALCIUM 9.1 08/02/2015 1045   CALCIUM 9.7 04/14/2013 0916   CALCIUM 9.0 12/18/2009 2256   ALKPHOS 71 08/02/2015 1045   ALKPHOS 42 04/14/2013 0916   AST 17 08/02/2015 1045   AST 23 04/14/2013 0916   ALT 21 08/02/2015 1045   ALT 24 04/14/2013 0916   BILITOT 0.68 08/02/2015 1045   BILITOT 0.4 04/14/2013 0916       ASSESSMENT AND PLAN: This is a very pleasant 64 years old [place] American female who was recently diagnosed with IgA multiple myeloma with significant elevation of IgA over 7000.  She is currently undergoing systemic chemotherapy with Carfilzomib, Cytoxan and dexamethasone and tolerating it fairly well.  she status post 5 cycles followed by autologous peripheral blood stem cell transplant in July of 2015. The patient was started on maintenance treatment with Revlimid initially 10 mg by  mouth daily for 2 months and currently undergoing maintenance treatment with Revlimid 15 mg by mouth daily status post 4 months. She is currently on Revlimid 10 mg by mouth daily status post 4 months.  The recent myeloma panel showed no significant evidence for disease progression. The patient continues to have mild neutropenia. I recommended for the patient to continue her current treatment with Revlimid but she will take a week off treatment because of the neutropenia. She will come back for follow-up visit in one month for reevaluation with repeat CBC, complaints metabolic panel and LDH. She was advised to call immediately she has any concerning symptoms in the interval. The patient voices understanding of current disease status and treatment options and is in agreement with the current care plan.  All questions were answered. The patient knows to call the clinic with any problems, questions or concerns. We can certainly see the patient much sooner if necessary.  Disclaimer: This note was dictated with voice recognition software. Similar sounding words can inadvertently be transcribed and may not be corrected upon review.

## 2015-08-14 ENCOUNTER — Other Ambulatory Visit: Payer: Self-pay | Admitting: Medical Oncology

## 2015-08-14 DIAGNOSIS — C9000 Multiple myeloma not having achieved remission: Secondary | ICD-10-CM

## 2015-08-14 MED ORDER — LENALIDOMIDE 10 MG PO CAPS: 10.0000 mg | ORAL_CAPSULE | Freq: Every day | ORAL | Status: DC

## 2015-09-05 ENCOUNTER — Telehealth: Payer: Self-pay | Admitting: Internal Medicine

## 2015-09-05 ENCOUNTER — Other Ambulatory Visit (HOSPITAL_BASED_OUTPATIENT_CLINIC_OR_DEPARTMENT_OTHER): Payer: 59

## 2015-09-05 ENCOUNTER — Encounter: Payer: Self-pay | Admitting: Internal Medicine

## 2015-09-05 ENCOUNTER — Ambulatory Visit (HOSPITAL_BASED_OUTPATIENT_CLINIC_OR_DEPARTMENT_OTHER): Payer: 59 | Admitting: Internal Medicine

## 2015-09-05 ENCOUNTER — Ambulatory Visit (HOSPITAL_BASED_OUTPATIENT_CLINIC_OR_DEPARTMENT_OTHER): Payer: 59

## 2015-09-05 VITALS — BP 104/61 | HR 49 | Temp 98.0°F | Resp 18 | Ht 66.0 in | Wt 139.0 lb

## 2015-09-05 DIAGNOSIS — D709 Neutropenia, unspecified: Secondary | ICD-10-CM

## 2015-09-05 DIAGNOSIS — C9000 Multiple myeloma not having achieved remission: Secondary | ICD-10-CM

## 2015-09-05 LAB — CBC WITH DIFFERENTIAL/PLATELET
BASO%: 0.8 % (ref 0.0–2.0)
Basophils Absolute: 0 10*3/uL (ref 0.0–0.1)
EOS%: 4.8 % (ref 0.0–7.0)
Eosinophils Absolute: 0.1 10*3/uL (ref 0.0–0.5)
HCT: 35.8 % (ref 34.8–46.6)
HGB: 11.6 g/dL (ref 11.6–15.9)
LYMPH%: 59.9 % — AB (ref 14.0–49.7)
MCH: 28.9 pg (ref 25.1–34.0)
MCHC: 32.4 g/dL (ref 31.5–36.0)
MCV: 89.3 fL (ref 79.5–101.0)
MONO#: 0.3 10*3/uL (ref 0.1–0.9)
MONO%: 11.7 % (ref 0.0–14.0)
NEUT%: 22.8 % — AB (ref 38.4–76.8)
NEUTROS ABS: 0.6 10*3/uL — AB (ref 1.5–6.5)
PLATELETS: 136 10*3/uL — AB (ref 145–400)
RBC: 4.01 10*6/uL (ref 3.70–5.45)
RDW: 14.6 % — ABNORMAL HIGH (ref 11.2–14.5)
WBC: 2.6 10*3/uL — AB (ref 3.9–10.3)
lymph#: 1.6 10*3/uL (ref 0.9–3.3)

## 2015-09-05 LAB — COMPREHENSIVE METABOLIC PANEL
ALT: 28 U/L (ref 0–55)
AST: 23 U/L (ref 5–34)
Albumin: 3.7 g/dL (ref 3.5–5.0)
Alkaline Phosphatase: 75 U/L (ref 40–150)
Anion Gap: 9 mEq/L (ref 3–11)
BUN: 9.4 mg/dL (ref 7.0–26.0)
CHLORIDE: 107 meq/L (ref 98–109)
CO2: 25 meq/L (ref 22–29)
CREATININE: 1 mg/dL (ref 0.6–1.1)
Calcium: 9.5 mg/dL (ref 8.4–10.4)
EGFR: 66 mL/min/{1.73_m2} — ABNORMAL LOW (ref >90)
GLUCOSE: 83 mg/dL (ref 70–140)
POTASSIUM: 3.7 meq/L (ref 3.5–5.1)
SODIUM: 141 meq/L (ref 136–145)
Total Bilirubin: 0.77 mg/dL (ref 0.20–1.20)
Total Protein: 6.9 g/dL (ref 6.4–8.3)

## 2015-09-05 LAB — LACTATE DEHYDROGENASE: LDH: 128 U/L (ref 125–245)

## 2015-09-05 MED ORDER — TBO-FILGRASTIM 300 MCG/0.5ML ~~LOC~~ SOSY
300.0000 ug | PREFILLED_SYRINGE | Freq: Once | SUBCUTANEOUS | Status: AC
  Administered 2015-09-05: 300 ug via SUBCUTANEOUS
  Filled 2015-09-05: qty 0.5

## 2015-09-05 NOTE — Progress Notes (Addendum)
Victoria [phone]) [phone]   [phone]) [phone]  OFFICE PROGRESS NOTE  Victoria Shin, MD 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 First dose on 11/15/2013. Status post 5 cycles. 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 3) Maintenance Revlimid 10 mg by mouth daily. Status post 2 months of treatment. 4) Maintenance Revlimid 15 mg by mouth daily. Status post 4 months of treatment.   CURRENT THERAPY: Maintenance Revlimid 10 mg by mouth daily. Status post 5 months.  INTERVAL HISTORY: Victoria Gates 64 y.o. female returns to the clinic today for follow up visit accompanied by her husband. The patient is feeling fine today with no specific complaints except for some nasal congestion. She is taking Claritin. She is tolerating her treatment with maintenance Revlimid fairly well with no other significant adverse effects. She denied having any significant fever or chills. She denied having any significant chest pain, shortness of breath, cough or hemoptysis. The patient denied having any significant weight loss or night sweats. She has no nausea or vomiting. She had repeat CBC and comprehensive metabolic panel performed earlier today and she is here for evaluation and discussion of her lab results.  MEDICAL HISTORY: Past Medical History  Diagnosis Date  . COLONIC POLYPS, HX OF 04/24/2007    hyperplastic only  . OSTEOPOROSIS 04/24/2007  . GERD 09/21/2010  . DM 03/18/2008  . HYPERCHOLESTEROLEMIA 07/03/2009  . ANEMIA-IRON DEFICIENCY 04/24/2007  . ALLERGIC RHINITIS CAUSE UNSPECIFIED 01/02/2009  . ESOPHAGEAL STRICTURE 04/15/2007    s/p dilitation  . HIATAL HERNIA 04/15/2007  . CERVICAL RADICULOPATHY, LEFT 02/01/2010  . 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 Chest pain 03/2012    a.  04/15/2012 Ex MV: Ex time 10 mins, EF 63%, no ischemia/infarct.  Occas PAC's/PVC's.    ALLERGIES:  is allergic to atorvastatin; rosuvastatin; rosuvastatin calcium; sulfamethoxazole; and sulfonamide derivatives.  MEDICATIONS:  Current Outpatient Prescriptions  Medication Sig Dispense Refill  . Ascorbic Acid (VITAMIN C) 1000 MG tablet Take 1,000 mg by mouth.    . Calcium 500-125 MG-UNIT TABS Take 1 tablet by mouth daily.      . cholecalciferol (VITAMIN D) 1000 UNITS tablet Take 1,000 Units by mouth daily.    . lansoprazole (PREVACID) 15 MG capsule Take 15 mg by mouth daily.    Marland Kitchen lenalidomide (REVLIMID) 10 MG capsule Take 1 capsule (10 mg total) by mouth daily. 28 capsule 0  . potassium chloride SA (K-DUR,KLOR-CON) 20 MEQ tablet Take 1 tablet (20 mEq total) by mouth 2 (two) times daily. (Patient not taking: Reported on 08/07/2015) 20 tablet 0  . warfarin (COUMADIN) 2 MG tablet TAKE 1 TABLET BY MOUTH EVERY DAY 30 tablet 2   No current facility-administered medications for this visit.    SURGICAL HISTORY:  Past Surgical History  Procedure Laterality Date  . Tubal ligation    . Esophagogastroduodenoscopy  04/15/2007    REVIEW OF SYSTEMS:  Constitutional: negative Eyes: negative Ears, nose, mouth, throat, and face: negative Respiratory: negative Cardiovascular: negative Gastrointestinal: negative Genitourinary:negative Integument/breast: negative Hematologic/lymphatic: negative Musculoskeletal:negative Neurological: negative Behavioral/Psych: negative Endocrine: negative Allergic/Immunologic: negative   PHYSICAL EXAMINATION: General appearance: alert, cooperative, fatigued and no distress Head: Normocephalic, without obvious abnormality, atraumatic Neck: no adenopathy, no JVD, supple, symmetrical, trachea midline and thyroid not enlarged, symmetric, no tenderness/mass/nodules Lymph nodes: Cervical, supraclavicular, and  axillary nodes normal. Resp: clear to auscultation  bilaterally Back: symmetric, no curvature. ROM normal. No CVA tenderness. Cardio: regular rate and rhythm, S1, S2 normal, no murmur, click, rub or gallop GI: soft, non-tender; bowel sounds normal; no masses,  no organomegaly Extremities: extremities normal, atraumatic, no cyanosis or edema Neurologic: Alert and oriented X 3, normal strength and tone. Normal symmetric reflexes. Normal coordination and gait  ECOG PERFORMANCE STATUS: 1 - Symptomatic but completely ambulatory  Blood pressure 104/61, pulse 49, temperature 98 F (36.7 C), temperature source Oral, resp. rate 18, height _0  (1.676 m), weight 139 lb (63.05 kg), SpO2 100 %.  LABORATORY DATA: Lab Results  Component Value Date   WBC 2.6* 09/05/2015   HGB 11.6 09/05/2015   HCT 35.8 09/05/2015   MCV 89.3 09/05/2015   PLT 136* 09/05/2015      Chemistry      Component Value Date/Time   NA 139 08/02/2015 1045   NA 140 04/14/2013 0916   K 3.6 08/02/2015 1045   K 3.8 04/14/2013 0916   CL 105 04/14/2013 0916   CO2 27 08/02/2015 1045   CO2 28 04/14/2013 0916   BUN 10.2 08/02/2015 1045   BUN 10 04/14/2013 0916   CREATININE 0.9 08/02/2015 1045   CREATININE 1.0 04/14/2013 0916      Component Value Date/Time   CALCIUM 9.1 08/02/2015 1045   CALCIUM 9.7 04/14/2013 0916   CALCIUM 9.0 12/18/2009 2256   ALKPHOS 71 08/02/2015 1045   ALKPHOS 42 04/14/2013 0916   AST 17 08/02/2015 1045   AST 23 04/14/2013 0916   ALT 21 08/02/2015 1045   ALT 24 04/14/2013 0916   BILITOT 0.68 08/02/2015 1045   BILITOT 0.4 04/14/2013 0916       ASSESSMENT AND PLAN: This is a very pleasant 64 years old [place] American female who was recently diagnosed with IgA multiple myeloma with significant elevation of IgA over 7000.  She is currently undergoing systemic chemotherapy with Carfilzomib, Cytoxan and dexamethasone and tolerating it fairly well.  she status post 5 cycles followed by autologous peripheral blood stem cell transplant in July of  2015. The patient was started on maintenance treatment with Revlimid initially 10 mg by mouth daily for 2 months and currently undergoing maintenance treatment with Revlimid 15 mg by mouth daily status post 4 months. She is currently on Revlimid 10 mg by mouth daily status post 5 months.  Her absolute neutrophil count is low today. I recommended for the patient to have Granix 300 g subcutaneously today and tomorrow. I also recommended for her to hold her treatment with Revlimid for 1 week. She will come back for follow-up visit in one month for reevaluation with repeat CBC, complaints metabolic panel and LDH. She was advised to call immediately she has any concerning symptoms in the interval. The patient voices understanding of current disease status and treatment options and is in agreement with the current care plan.  All questions were answered. The patient knows to call the clinic with any problems, questions or concerns. We can certainly see the patient much sooner if necessary.  Disclaimer: This note was dictated with voice recognition software. Similar sounding words can inadvertently be transcribed and may not be corrected upon review.

## 2015-09-05 NOTE — Telephone Encounter (Signed)
gave and printed appt sched and avs for pt for DEC and Jan 2017

## 2015-09-05 NOTE — Patient Instructions (Signed)

## 2015-09-06 ENCOUNTER — Ambulatory Visit (HOSPITAL_BASED_OUTPATIENT_CLINIC_OR_DEPARTMENT_OTHER): Payer: 59

## 2015-09-06 VITALS — BP 106/65 | HR 58 | Temp 98.3°F

## 2015-09-06 DIAGNOSIS — C9000 Multiple myeloma not having achieved remission: Secondary | ICD-10-CM

## 2015-09-06 MED ORDER — TBO-FILGRASTIM 300 MCG/0.5ML ~~LOC~~ SOSY
300.0000 ug | PREFILLED_SYRINGE | Freq: Once | SUBCUTANEOUS | Status: AC
  Administered 2015-09-06: 300 ug via SUBCUTANEOUS
  Filled 2015-09-06: qty 0.5

## 2015-09-14 ENCOUNTER — Other Ambulatory Visit: Payer: Self-pay | Admitting: *Deleted

## 2015-09-14 DIAGNOSIS — C9000 Multiple myeloma not having achieved remission: Secondary | ICD-10-CM

## 2015-09-14 MED ORDER — LENALIDOMIDE 10 MG PO CAPS: 10.0000 mg | ORAL_CAPSULE | Freq: Every day | ORAL | Status: DC

## 2015-10-02 ENCOUNTER — Other Ambulatory Visit: Payer: Self-pay | Admitting: Internal Medicine

## 2015-10-04 ENCOUNTER — Encounter: Payer: Self-pay | Admitting: Internal Medicine

## 2015-10-04 ENCOUNTER — Ambulatory Visit (HOSPITAL_BASED_OUTPATIENT_CLINIC_OR_DEPARTMENT_OTHER): Payer: 59 | Admitting: Internal Medicine

## 2015-10-04 ENCOUNTER — Telehealth: Payer: Self-pay | Admitting: Internal Medicine

## 2015-10-04 ENCOUNTER — Other Ambulatory Visit (HOSPITAL_BASED_OUTPATIENT_CLINIC_OR_DEPARTMENT_OTHER): Payer: 59

## 2015-10-04 VITALS — BP 113/64 | HR 60 | Temp 97.9°F | Resp 18 | Ht 66.0 in | Wt 141.8 lb

## 2015-10-04 DIAGNOSIS — C9000 Multiple myeloma not having achieved remission: Secondary | ICD-10-CM | POA: Diagnosis not present

## 2015-10-04 LAB — COMPREHENSIVE METABOLIC PANEL
ALBUMIN: 3.5 g/dL (ref 3.5–5.0)
ALK PHOS: 58 U/L (ref 40–150)
ALT: 25 U/L (ref 0–55)
AST: 18 U/L (ref 5–34)
Anion Gap: 7 mEq/L (ref 3–11)
BILIRUBIN TOTAL: 0.64 mg/dL (ref 0.20–1.20)
BUN: 11.1 mg/dL (ref 7.0–26.0)
CO2: 28 meq/L (ref 22–29)
Calcium: 8.9 mg/dL (ref 8.4–10.4)
Chloride: 108 mEq/L (ref 98–109)
Creatinine: 0.9 mg/dL (ref 0.6–1.1)
EGFR: 75 mL/min/{1.73_m2} — ABNORMAL LOW (ref >90)
GLUCOSE: 86 mg/dL (ref 70–140)
Potassium: 3.9 mEq/L (ref 3.5–5.1)
SODIUM: 142 meq/L (ref 136–145)
TOTAL PROTEIN: 6.6 g/dL (ref 6.4–8.3)

## 2015-10-04 LAB — CBC WITH DIFFERENTIAL/PLATELET
BASO%: 0.9 % (ref 0.0–2.0)
BASOS ABS: 0 10*3/uL (ref 0.0–0.1)
EOS ABS: 0.2 10*3/uL (ref 0.0–0.5)
EOS%: 7.3 % — ABNORMAL HIGH (ref 0.0–7.0)
HCT: 34.3 % — ABNORMAL LOW (ref 34.8–46.6)
HEMOGLOBIN: 11.3 g/dL — AB (ref 11.6–15.9)
LYMPH#: 1.8 10*3/uL (ref 0.9–3.3)
LYMPH%: 55.4 % — ABNORMAL HIGH (ref 14.0–49.7)
MCH: 29 pg (ref 25.1–34.0)
MCHC: 32.8 g/dL (ref 31.5–36.0)
MCV: 88.4 fL (ref 79.5–101.0)
MONO#: 0.4 10*3/uL (ref 0.1–0.9)
MONO%: 12.4 % (ref 0.0–14.0)
NEUT%: 24 % — ABNORMAL LOW (ref 38.4–76.8)
NEUTROS ABS: 0.8 10*3/uL — AB (ref 1.5–6.5)
Platelets: 134 10*3/uL — ABNORMAL LOW (ref 145–400)
RBC: 3.88 10*6/uL (ref 3.70–5.45)
RDW: 15.7 % — AB (ref 11.2–14.5)
WBC: 3.2 10*3/uL — ABNORMAL LOW (ref 3.9–10.3)

## 2015-10-04 LAB — LACTATE DEHYDROGENASE: LDH: 122 U/L — ABNORMAL LOW (ref 125–245)

## 2015-10-04 NOTE — Telephone Encounter (Signed)
per pof to sch pt appt-gave pt copy of avs °

## 2015-10-04 NOTE — Progress Notes (Signed)
    [facility] [phone]   [phone]  OFFICE PROGRESS NOTE  ELLISON, SEAN, MD 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 First dose on 11/15/2013. Status post 5 cycles. 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 3) Maintenance Revlimid 10 mg by mouth daily. Status post 2 months of treatment. 4) Maintenance Revlimid 15 mg by mouth daily. Status post 4 months of treatment.   CURRENT THERAPY: Maintenance Revlimid 10 mg by mouth daily. Status post 5 months.  INTERVAL HISTORY: Victoria Gates 64 y.o. female returns to the clinic today for follow up visit accompanied by her husband. The patient is feeling fine today with no specific complaints. She is tolerating her treatment with maintenance Revlimid fairly well with no other significant adverse effects. She denied having any significant fever or chills. She denied having any significant chest pain, shortness of breath, cough or hemoptysis. The patient denied having any significant weight loss or night sweats. She has no nausea or vomiting. She had repeat CBC and comprehensive metabolic panel performed earlier today and she is here for evaluation and discussion of her lab results.  MEDICAL HISTORY: Past Medical History  Diagnosis Date  . COLONIC POLYPS, HX OF 04/24/2007    hyperplastic only  . OSTEOPOROSIS 04/24/2007  . GERD 09/21/2010  . DM 03/18/2008  . HYPERCHOLESTEROLEMIA 07/03/2009  . ANEMIA-IRON DEFICIENCY 04/24/2007  . ALLERGIC RHINITIS CAUSE UNSPECIFIED 01/02/2009  . ESOPHAGEAL STRICTURE 04/15/2007    s/p dilitation  . HIATAL HERNIA 04/15/2007  . CERVICAL RADICULOPATHY, LEFT 02/01/2010  . 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 Chest pain 03/2012    a. 04/15/2012 Ex MV: Ex time 10 mins, EF 63%, no  ischemia/infarct.  Occas PAC's/PVC's.    ALLERGIES:  is allergic to atorvastatin; rosuvastatin; rosuvastatin calcium; sulfamethoxazole; and sulfonamide derivatives.  MEDICATIONS:  Current Outpatient Prescriptions  Medication Sig Dispense Refill  . Ascorbic Acid (VITAMIN C) 1000 MG tablet Take 1,000 mg by mouth.    . Calcium 500-125 MG-UNIT TABS Take 1 tablet by mouth daily.      . cholecalciferol (VITAMIN D) 1000 UNITS tablet Take 1,000 Units by mouth daily.    . lenalidomide (REVLIMID) 10 MG capsule Take 1 capsule (10 mg total) by mouth daily. 28 capsule 0  . potassium chloride SA (K-DUR,KLOR-CON) 20 MEQ tablet Take 1 tablet (20 mEq total) by mouth 2 (two) times daily. (Patient not taking: Reported on 08/07/2015) 20 tablet 0  . warfarin (COUMADIN) 2 MG tablet TAKE 1 TABLET BY MOUTH EVERY DAY 30 tablet 2   No current facility-administered medications for this visit.    SURGICAL HISTORY:  Past Surgical History  Procedure Laterality Date  . Tubal ligation    . Esophagogastroduodenoscopy  04/15/2007    REVIEW OF SYSTEMS:  A comprehensive review of systems was negative.   PHYSICAL EXAMINATION: General appearance: alert, cooperative, fatigued and no distress Head: Normocephalic, without obvious abnormality, atraumatic Neck: no adenopathy, no JVD, supple, symmetrical, trachea midline and thyroid not enlarged, symmetric, no tenderness/mass/nodules Lymph nodes: Cervical, supraclavicular, and axillary nodes normal. Resp: clear to auscultation bilaterally Back: symmetric, no curvature. ROM normal. No CVA tenderness. Cardio: regular rate and rhythm, S1, S2 normal, no murmur, click, rub or gallop GI: soft, non-tender; bowel sounds normal; no masses,  no organomegaly Extremities: extremities normal, atraumatic, no cyanosis   or edema Neurologic: Alert and oriented X 3, normal strength and tone. Normal symmetric reflexes. Normal coordination and gait  ECOG PERFORMANCE STATUS: 1 - Symptomatic but  completely ambulatory  Blood pressure 113/64, pulse 60, temperature 97.9 F (36.6 C), temperature source Oral, resp. rate 18, height 5' 6" (1.676 m), weight 141 lb 12.8 oz (64.32 kg), SpO2 100 %.  LABORATORY DATA: Lab Results  Component Value Date   WBC 3.2* 10/04/2015   HGB 11.3* 10/04/2015   HCT 34.3* 10/04/2015   MCV 88.4 10/04/2015   PLT 134* 10/04/2015      Chemistry      Component Value Date/Time   NA 141 09/05/2015 0752   NA 140 04/14/2013 0916   K 3.7 09/05/2015 0752   K 3.8 04/14/2013 0916   CL 105 04/14/2013 0916   CO2 25 09/05/2015 0752   CO2 28 04/14/2013 0916   BUN 9.4 09/05/2015 0752   BUN 10 04/14/2013 0916   CREATININE 1.0 09/05/2015 0752   CREATININE 1.0 04/14/2013 0916      Component Value Date/Time   CALCIUM 9.5 09/05/2015 0752   CALCIUM 9.7 04/14/2013 0916   CALCIUM 9.0 12/18/2009 2256   ALKPHOS 75 09/05/2015 0752   ALKPHOS 42 04/14/2013 0916   AST 23 09/05/2015 0752   AST 23 04/14/2013 0916   ALT 28 09/05/2015 0752   ALT 24 04/14/2013 0916   BILITOT 0.77 09/05/2015 0752   BILITOT 0.4 04/14/2013 0916       ASSESSMENT AND PLAN: This is a very pleasant 64 years old African American female who was recently diagnosed with IgA multiple myeloma with significant elevation of IgA over 7000.  She is currently undergoing systemic chemotherapy with Carfilzomib, Cytoxan and dexamethasone and tolerating it fairly well.  she status post 5 cycles followed by autologous peripheral blood stem cell transplant in July of 2015. The patient was started on maintenance treatment with Revlimid initially 10 mg by mouth daily for 2 months and currently undergoing maintenance treatment with Revlimid 15 mg by mouth daily status post 4 months. She is currently on Revlimid 10 mg by mouth daily status post 6 months.  She continues to have low absolute neutrophil count but the patient is asymptomatic. I recommended for her to continue her current treatment with Revlimid 10 mg by  mouth daily She will come back for follow-up visit in one month for reevaluation with repeat myeloma panel. She was advised to call immediately she has any concerning symptoms in the interval. The patient voices understanding of current disease status and treatment options and is in agreement with the current care plan.  All questions were answered. The patient knows to call the clinic with any problems, questions or concerns. We can certainly see the patient much sooner if necessary.  Disclaimer: This note was dictated with voice recognition software. Similar sounding words can inadvertently be transcribed and may not be corrected upon review.       

## 2015-10-26 ENCOUNTER — Other Ambulatory Visit (HOSPITAL_BASED_OUTPATIENT_CLINIC_OR_DEPARTMENT_OTHER): Payer: 59

## 2015-10-26 DIAGNOSIS — C9000 Multiple myeloma not having achieved remission: Secondary | ICD-10-CM

## 2015-10-26 LAB — CBC WITH DIFFERENTIAL/PLATELET
BASO%: 2.4 % — AB (ref 0.0–2.0)
Basophils Absolute: 0.1 10*3/uL (ref 0.0–0.1)
EOS%: 7.6 % — ABNORMAL HIGH (ref 0.0–7.0)
Eosinophils Absolute: 0.2 10*3/uL (ref 0.0–0.5)
HEMATOCRIT: 34.7 % — AB (ref 34.8–46.6)
HEMOGLOBIN: 11.5 g/dL — AB (ref 11.6–15.9)
LYMPH#: 1.4 10*3/uL (ref 0.9–3.3)
LYMPH%: 57.8 % — ABNORMAL HIGH (ref 14.0–49.7)
MCH: 29.3 pg (ref 25.1–34.0)
MCHC: 33.1 g/dL (ref 31.5–36.0)
MCV: 88.5 fL (ref 79.5–101.0)
MONO#: 0.3 10*3/uL (ref 0.1–0.9)
MONO%: 10 % (ref 0.0–14.0)
NEUT%: 22.2 % — ABNORMAL LOW (ref 38.4–76.8)
NEUTROS ABS: 0.6 10*3/uL — AB (ref 1.5–6.5)
Platelets: 111 10*3/uL — ABNORMAL LOW (ref 145–400)
RBC: 3.92 10*6/uL (ref 3.70–5.45)
RDW: 15.3 % — ABNORMAL HIGH (ref 11.2–14.5)
WBC: 2.5 10*3/uL — AB (ref 3.9–10.3)
nRBC: 0 % (ref 0–0)

## 2015-10-26 LAB — COMPREHENSIVE METABOLIC PANEL
ALBUMIN: 3.6 g/dL (ref 3.5–5.0)
ALT: 32 U/L (ref 0–55)
AST: 33 U/L (ref 5–34)
Alkaline Phosphatase: 62 U/L (ref 40–150)
Anion Gap: 8 mEq/L (ref 3–11)
BUN: 11.8 mg/dL (ref 7.0–26.0)
CALCIUM: 9 mg/dL (ref 8.4–10.4)
CHLORIDE: 106 meq/L (ref 98–109)
CO2: 27 mEq/L (ref 22–29)
CREATININE: 0.9 mg/dL (ref 0.6–1.1)
EGFR: 75 mL/min/{1.73_m2} — ABNORMAL LOW (ref >90)
Glucose: 83 mg/dl (ref 70–140)
Potassium: 3.7 mEq/L (ref 3.5–5.1)
Sodium: 141 mEq/L (ref 136–145)
Total Bilirubin: 0.69 mg/dL (ref 0.20–1.20)
Total Protein: 6.7 g/dL (ref 6.4–8.3)

## 2015-10-26 LAB — LACTATE DEHYDROGENASE: LDH: 154 U/L (ref 125–245)

## 2015-10-27 LAB — KAPPA/LAMBDA LIGHT CHAINS
IG LAMBDA FREE LIGHT CHAIN: 30.38 mg/L — AB (ref 5.71–26.30)
Ig Kappa Free Light Chain: 39.24 mg/L — ABNORMAL HIGH (ref 3.30–19.40)
KAPPA/LAMBDA FLC RATIO: 1.29 (ref 0.26–1.65)

## 2015-10-27 LAB — IGG, IGA, IGM
IGA/IMMUNOGLOBULIN A, SERUM: 586 mg/dL — AB (ref 87–352)
IGM (IMMUNOGLOBIN M), SRM: 14 mg/dL — AB (ref 26–217)
IgG, Qn, Serum: 896 mg/dL (ref 700–1600)

## 2015-10-27 LAB — BETA 2 MICROGLOBULIN, SERUM: Beta-2: 1.8 mg/L (ref 0.6–2.4)

## 2015-11-02 ENCOUNTER — Encounter: Payer: Self-pay | Admitting: Internal Medicine

## 2015-11-02 ENCOUNTER — Telehealth: Payer: Self-pay | Admitting: Internal Medicine

## 2015-11-02 ENCOUNTER — Ambulatory Visit (HOSPITAL_BASED_OUTPATIENT_CLINIC_OR_DEPARTMENT_OTHER): Payer: 59 | Admitting: Internal Medicine

## 2015-11-02 VITALS — BP 90/62 | HR 83 | Temp 99.2°F | Resp 18 | Ht 66.0 in | Wt 138.8 lb

## 2015-11-02 DIAGNOSIS — R110 Nausea: Secondary | ICD-10-CM | POA: Diagnosis not present

## 2015-11-02 DIAGNOSIS — C9000 Multiple myeloma not having achieved remission: Secondary | ICD-10-CM | POA: Diagnosis not present

## 2015-11-02 DIAGNOSIS — D709 Neutropenia, unspecified: Secondary | ICD-10-CM | POA: Diagnosis not present

## 2015-11-02 MED ORDER — PROCHLORPERAZINE MALEATE 10 MG PO TABS: 10.0000 mg | ORAL_TABLET | Freq: Four times a day (QID) | ORAL | Status: DC | PRN

## 2015-11-02 NOTE — Progress Notes (Signed)
[facility] [phone]   [phone]  OFFICE PROGRESS NOTE  [name], MD 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 First dose on 11/15/2013. Status post 5 cycles. 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 3) Maintenance Revlimid 10 mg by mouth daily. Status post 2 months of treatment. 4) Maintenance Revlimid 15 mg by mouth daily. Status post 4 months of treatment.   CURRENT THERAPY: Maintenance Revlimid 10 mg by mouth daily. Status post 6 months.  INTERVAL HISTORY: Victoria Gates 65 y.o. female returns to the clinic today for follow up visit accompanied by her husband. The patient is feeling fine today with no specific complaints except for mild nausea over the last few days. She also has nasal congestion. She denied having any significant diarrhea. She is tolerating her treatment with maintenance Revlimid fairly well with no other significant adverse effects. She denied having any significant fever or chills. She denied having any significant chest pain, shortness of breath, cough or hemoptysis. The patient denied having any significant weight loss or night sweats. She has no nausea or vomiting. She had repeat myeloma panel performed recently and she is here for evaluation and discussion of her lab results.  MEDICAL HISTORY: Past Medical History  Diagnosis Date  . COLONIC POLYPS, HX OF 04/24/2007    hyperplastic only  . OSTEOPOROSIS 04/24/2007  . GERD 09/21/2010  . DM 03/18/2008  . HYPERCHOLESTEROLEMIA 07/03/2009  . ANEMIA-IRON DEFICIENCY 04/24/2007  . ALLERGIC RHINITIS CAUSE UNSPECIFIED 01/02/2009  . ESOPHAGEAL STRICTURE 04/15/2007    s/p dilitation  . HIATAL HERNIA 04/15/2007  . CERVICAL RADICULOPATHY, LEFT 02/01/2010  . HERPES ZOSTER  11/05/2010  . Chest pain 03/2012    a. 04/15/2012 Ex MV: Ex time 10 mins, EF 63%, no ischemia/infarct.  Occas PAC's/PVC's.    ALLERGIES:  is allergic to atorvastatin; rosuvastatin; rosuvastatin calcium; sulfamethoxazole; and sulfonamide derivatives.  MEDICATIONS:  Current Outpatient Prescriptions  Medication Sig Dispense Refill  . Ascorbic Acid (VITAMIN C) 1000 MG tablet Take 1,000 mg by mouth.    . Calcium 500-125 MG-UNIT TABS Take 1 tablet by mouth daily.      . cholecalciferol (VITAMIN D) 1000 UNITS tablet Take 1,000 Units by mouth daily.    Marland Kitchen lenalidomide (REVLIMID) 10 MG capsule Take 1 capsule (10 mg total) by mouth daily. 28 capsule 0  . potassium chloride SA (K-DUR,KLOR-CON) 20 MEQ tablet Take 1 tablet (20 mEq total) by mouth 2 (two) times daily. 20 tablet 0  . warfarin (COUMADIN) 2 MG tablet TAKE 1 TABLET BY MOUTH EVERY DAY 30 tablet 2   No current facility-administered medications for this visit.    SURGICAL HISTORY:  Past Surgical History  Procedure Laterality Date  . Tubal ligation    . Esophagogastroduodenoscopy  04/15/2007    REVIEW OF SYSTEMS:  Constitutional: positive for fatigue Eyes: negative Ears, nose, mouth, throat, and face: negative Respiratory: negative Cardiovascular: negative Gastrointestinal: positive for nausea Genitourinary:negative Integument/breast: negative Hematologic/lymphatic: negative Musculoskeletal:negative Neurological: negative Behavioral/Psych: negative Endocrine: negative Allergic/Immunologic: negative   PHYSICAL EXAMINATION: General appearance: alert, cooperative, fatigued and no distress Head: Normocephalic, without obvious abnormality, atraumatic Neck: no adenopathy, no JVD, supple, symmetrical, trachea midline and thyroid not enlarged, symmetric, no tenderness/mass/nodules Lymph nodes: Cervical, supraclavicular, and axillary nodes normal. Resp: clear to auscultation bilaterally Back: symmetric,  no curvature. ROM normal. No CVA  tenderness. Cardio: regular rate and rhythm, S1, S2 normal, no murmur, click, rub or gallop GI: soft, non-tender; bowel sounds normal; no masses,  no organomegaly Extremities: extremities normal, atraumatic, no cyanosis or edema Neurologic: Alert and oriented X 3, normal strength and tone. Normal symmetric reflexes. Normal coordination and gait  ECOG PERFORMANCE STATUS: 1 - Symptomatic but completely ambulatory  There were no vitals taken for this visit.  LABORATORY DATA: Lab Results  Component Value Date   WBC 2.5* 10/26/2015   HGB 11.5* 10/26/2015   HCT 34.7* 10/26/2015   MCV 88.5 10/26/2015   PLT 111* 10/26/2015      Chemistry      Component Value Date/Time   NA 141 10/26/2015 0759   NA 140 04/14/2013 0916   K 3.7 10/26/2015 0759   K 3.8 04/14/2013 0916   CL 105 04/14/2013 0916   CO2 27 10/26/2015 0759   CO2 28 04/14/2013 0916   BUN 11.8 10/26/2015 0759   BUN 10 04/14/2013 0916   CREATININE 0.9 10/26/2015 0759   CREATININE 1.0 04/14/2013 0916      Component Value Date/Time   CALCIUM 9.0 10/26/2015 0759   CALCIUM 9.7 04/14/2013 0916   CALCIUM 9.0 12/18/2009 2256   ALKPHOS 62 10/26/2015 0759   ALKPHOS 42 04/14/2013 0916   AST 33 10/26/2015 0759   AST 23 04/14/2013 0916   ALT 32 10/26/2015 0759   ALT 24 04/14/2013 0916   BILITOT 0.69 10/26/2015 0759   BILITOT 0.4 04/14/2013 0916     Myeloma panel on 10/26/2015: Beta-2 microglobulin 1.8, Bilateral chain 39.24, free lambda light chain 30.38, /lambda ratio 1.29. IgG 896, IgA 586 and IgM 14  ASSESSMENT AND PLAN: This is a very pleasant 65 years old [place] American female who was recently diagnosed with IgA multiple myeloma with significant elevation of IgA over 7000.  She is currently undergoing systemic chemotherapy with Carfilzomib, Cytoxan and dexamethasone and tolerating it fairly well.  she status post 5 cycles followed by autologous peripheral blood stem cell transplant in July of 2015. The patient was started  on maintenance treatment with Revlimid initially 10 mg by mouth daily for 2 months and currently undergoing maintenance treatment with Revlimid 15 mg by mouth daily status post 4 months. She is currently on Revlimid 10 mg by mouth daily status post 6 months.  Her myeloma panel showed no evidence for disease progression but the patient continues to have neutropenia. I recommended for her to continue her current treatment with Revlimid 10 mg by mouth daily but we will delay the start of the next cycle of her treatment by one week. She will come back for follow-up visit in one month for reevaluation with repeat CBC, comprehensive metabolic panel and LDH. For the nausea, I will send a prescription for Compazine 10 mg by mouth every 6 hours as needed to her pharmacy. She was advised to call immediately she has any concerning symptoms in the interval. The patient voices understanding of current disease status and treatment options and is in agreement with the current care plan.  All questions were answered. The patient knows to call the clinic with any problems, questions or concerns. We can certainly see the patient much sooner if necessary.  Disclaimer: This note was dictated with voice recognition software. Similar sounding words can inadvertently be transcribed and may not be corrected upon review.

## 2015-11-02 NOTE — Telephone Encounter (Signed)
per pof to sch pt appt-gave pt copy of avs °

## 2015-11-22 ENCOUNTER — Other Ambulatory Visit: Payer: Self-pay | Admitting: Medical Oncology

## 2015-11-22 DIAGNOSIS — C9000 Multiple myeloma not having achieved remission: Secondary | ICD-10-CM

## 2015-11-22 MED ORDER — LENALIDOMIDE 10 MG PO CAPS: 10.0000 mg | ORAL_CAPSULE | Freq: Every day | ORAL | Status: DC

## 2015-11-30 ENCOUNTER — Ambulatory Visit (HOSPITAL_BASED_OUTPATIENT_CLINIC_OR_DEPARTMENT_OTHER): Payer: 59 | Admitting: Internal Medicine

## 2015-11-30 ENCOUNTER — Other Ambulatory Visit (HOSPITAL_BASED_OUTPATIENT_CLINIC_OR_DEPARTMENT_OTHER): Payer: 59

## 2015-11-30 ENCOUNTER — Telehealth: Payer: Self-pay | Admitting: Internal Medicine

## 2015-11-30 ENCOUNTER — Encounter: Payer: Self-pay | Admitting: Internal Medicine

## 2015-11-30 VITALS — BP 102/60 | HR 61 | Temp 98.4°F | Resp 18 | Ht 66.0 in | Wt 132.9 lb

## 2015-11-30 DIAGNOSIS — C9000 Multiple myeloma not having achieved remission: Secondary | ICD-10-CM

## 2015-11-30 DIAGNOSIS — E876 Hypokalemia: Secondary | ICD-10-CM | POA: Insufficient documentation

## 2015-11-30 HISTORY — DX: Hypokalemia: E87.6

## 2015-11-30 LAB — COMPREHENSIVE METABOLIC PANEL
ALT: 19 U/L (ref 0–55)
ANION GAP: 10 meq/L (ref 3–11)
AST: 14 U/L (ref 5–34)
Albumin: 3.1 g/dL — ABNORMAL LOW (ref 3.5–5.0)
Alkaline Phosphatase: 95 U/L (ref 40–150)
BUN: 6.4 mg/dL — AB (ref 7.0–26.0)
CHLORIDE: 107 meq/L (ref 98–109)
CO2: 24 meq/L (ref 22–29)
CREATININE: 0.9 mg/dL (ref 0.6–1.1)
Calcium: 9.1 mg/dL (ref 8.4–10.4)
EGFR: 76 mL/min/{1.73_m2} — ABNORMAL LOW (ref >90)
Glucose: 89 mg/dl (ref 70–140)
Potassium: 3 mEq/L — CL (ref 3.5–5.1)
SODIUM: 141 meq/L (ref 136–145)
Total Bilirubin: 0.4 mg/dL (ref 0.20–1.20)
Total Protein: 7.2 g/dL (ref 6.4–8.3)

## 2015-11-30 LAB — CBC WITH DIFFERENTIAL/PLATELET
BASO%: 0.3 % (ref 0.0–2.0)
Basophils Absolute: 0 10*3/uL (ref 0.0–0.1)
EOS%: 3.6 % (ref 0.0–7.0)
Eosinophils Absolute: 0.1 10*3/uL (ref 0.0–0.5)
HCT: 33.7 % — ABNORMAL LOW (ref 34.8–46.6)
HGB: 11.2 g/dL — ABNORMAL LOW (ref 11.6–15.9)
LYMPH%: 47.4 % (ref 14.0–49.7)
MCH: 28.8 pg (ref 25.1–34.0)
MCHC: 33.2 g/dL (ref 31.5–36.0)
MCV: 86.6 fL (ref 79.5–101.0)
MONO#: 0.3 10*3/uL (ref 0.1–0.9)
MONO%: 10 % (ref 0.0–14.0)
NEUT#: 1.3 10*3/uL — ABNORMAL LOW (ref 1.5–6.5)
NEUT%: 38.7 % (ref 38.4–76.8)
Platelets: 179 10*3/uL (ref 145–400)
RBC: 3.89 10*6/uL (ref 3.70–5.45)
RDW: 14.3 % (ref 11.2–14.5)
WBC: 3.3 10*3/uL — ABNORMAL LOW (ref 3.9–10.3)
lymph#: 1.6 10*3/uL (ref 0.9–3.3)

## 2015-11-30 LAB — LACTATE DEHYDROGENASE: LDH: 102 U/L — ABNORMAL LOW (ref 125–245)

## 2015-11-30 MED ORDER — POTASSIUM CHLORIDE CRYS ER 20 MEQ PO TBCR: 20.0000 meq | EXTENDED_RELEASE_TABLET | Freq: Two times a day (BID) | ORAL | Status: DC

## 2015-11-30 NOTE — Progress Notes (Signed)
[place] [phone]   [phone]  OFFICE PROGRESS NOTE  [name], MD 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 First dose on 11/15/2013. Status post 5 cycles. 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 3) Maintenance Revlimid 10 mg by mouth daily. Status post 2 months of treatment. 4) Maintenance Revlimid 15 mg by mouth daily. Status post 4 months of treatment.   CURRENT THERAPY: Maintenance Revlimid 10 mg by mouth daily. Status post 7 months.  INTERVAL HISTORY: Victoria Gates 65 y.o. female returns to the clinic today for follow up visit accompanied by her husband. The patient is feeling fine today with no specific complaints except for mild diarrhea but she does not take her Imodium as prescribed. She is tolerating her treatment with maintenance Revlimid fairly well with no other significant adverse effects. She denied having any significant fever or chills. She denied having any significant chest pain, shortness of breath, cough or hemoptysis. The patient denied having any significant weight loss or night sweats. She has no nausea or vomiting. She had repeat CBC and comprehensive metabolic panel performed earlier today and she is here for evaluation and discussion of her lab results.  MEDICAL HISTORY: Past Medical History  Diagnosis Date  . COLONIC POLYPS, HX OF 04/24/2007    hyperplastic only  . OSTEOPOROSIS 04/24/2007  . GERD 09/21/2010  . DM 03/18/2008  . HYPERCHOLESTEROLEMIA 07/03/2009  . ANEMIA-IRON DEFICIENCY 04/24/2007  . ALLERGIC RHINITIS CAUSE UNSPECIFIED 01/02/2009  . ESOPHAGEAL STRICTURE 04/15/2007    s/p dilitation  . HIATAL HERNIA 04/15/2007  . CERVICAL RADICULOPATHY, LEFT 02/01/2010  . 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 Chest pain  03/2012    a. 04/15/2012 Ex MV: Ex time 10 mins, EF 63%, no ischemia/infarct.  Occas PAC's/PVC's.    ALLERGIES:  is allergic to atorvastatin; rosuvastatin; rosuvastatin calcium; sulfamethoxazole; and sulfonamide derivatives.  MEDICATIONS:  Current Outpatient Prescriptions  Medication Sig Dispense Refill  . Ascorbic Acid (VITAMIN C) 1000 MG tablet Take 1,000 mg by mouth.    . Calcium 500-125 MG-UNIT TABS Take 1 tablet by mouth daily.      . cholecalciferol (VITAMIN D) 1000 UNITS tablet Take 1,000 Units by mouth daily.    Marland Kitchen lenalidomide (REVLIMID) 10 MG capsule Take 1 capsule (10 mg total) by mouth daily. 28 capsule 0  . potassium chloride SA (K-DUR,KLOR-CON) 20 MEQ tablet Take 1 tablet (20 mEq total) by mouth 2 (two) times daily. 20 tablet 0  . prochlorperazine (COMPAZINE) 10 MG tablet Take 1 tablet (10 mg total) by mouth every 6 (six) hours as needed for nausea or vomiting. 30 tablet 0  . warfarin (COUMADIN) 2 MG tablet TAKE 1 TABLET BY MOUTH EVERY DAY 30 tablet 2   No current facility-administered medications for this visit.    SURGICAL HISTORY:  Past Surgical History  Procedure Laterality Date  . Tubal ligation    . Esophagogastroduodenoscopy  04/15/2007    REVIEW OF SYSTEMS:  A comprehensive review of systems was negative except for: Constitutional: positive for weight loss Gastrointestinal: positive for diarrhea   PHYSICAL EXAMINATION: General appearance: alert, cooperative, fatigued and no distress Head: Normocephalic, without obvious abnormality, atraumatic Neck: no adenopathy, no JVD, supple, symmetrical, trachea midline and thyroid not enlarged, symmetric, no tenderness/mass/nodules Lymph nodes: Cervical, supraclavicular, and axillary nodes  normal. Resp: clear to auscultation bilaterally Back: symmetric, no curvature. ROM normal. No CVA tenderness. Cardio: regular rate and rhythm, S1, S2 normal, no murmur, click, rub or gallop GI: soft, non-tender; bowel sounds normal; no  masses,  no organomegaly Extremities: extremities normal, atraumatic, no cyanosis or edema Neurologic: Alert and oriented X 3, normal strength and tone. Normal symmetric reflexes. Normal coordination and gait  ECOG PERFORMANCE STATUS: 1 - Symptomatic but completely ambulatory  Blood pressure 102/60, pulse 61, temperature 98.4 F (36.9 C), temperature source Oral, resp. rate 18, height '5\' 6"'$  (1.676 m), weight 132 lb 14.4 oz (60.283 kg), SpO2 98 %.  LABORATORY DATA: Lab Results  Component Value Date   WBC 3.3* 11/30/2015   HGB 11.2* 11/30/2015   HCT 33.7* 11/30/2015   MCV 86.6 11/30/2015   PLT 179 11/30/2015      Chemistry      Component Value Date/Time   NA 141 11/30/2015 0907   NA 140 04/14/2013 0916   K 3.0 Repeated and Verified* 11/30/2015 0907   K 3.8 04/14/2013 0916   CL 105 04/14/2013 0916   CO2 24 11/30/2015 0907   CO2 28 04/14/2013 0916   BUN 6.4* 11/30/2015 0907   BUN 10 04/14/2013 0916   CREATININE 0.9 11/30/2015 0907   CREATININE 1.0 04/14/2013 0916      Component Value Date/Time   CALCIUM 9.1 11/30/2015 0907   CALCIUM 9.7 04/14/2013 0916   CALCIUM 9.0 12/18/2009 2256   ALKPHOS 95 11/30/2015 0907   ALKPHOS 42 04/14/2013 0916   AST 14 11/30/2015 0907   AST 23 04/14/2013 0916   ALT 19 11/30/2015 0907   ALT 24 04/14/2013 0916   BILITOT 0.40 11/30/2015 0907   BILITOT 0.4 04/14/2013 0916     Myeloma panel on 10/26/2015: Beta-2 microglobulin 1.8, Bilateral chain 39.24, free lambda light chain 30.38, /lambda ratio 1.29. IgG 896, IgA 586 and IgM 14  ASSESSMENT AND PLAN: This is a very pleasant 65 years old [place] American female who was recently diagnosed with IgA multiple myeloma with significant elevation of IgA over 7000.  She is currently undergoing systemic chemotherapy with Carfilzomib, Cytoxan and dexamethasone and tolerating it fairly well.  she status post 5 cycles followed by autologous peripheral blood stem cell transplant in July of 2015. The patient  was started on maintenance treatment with Revlimid initially 10 mg by mouth daily for 2 months and currently undergoing maintenance treatment with Revlimid 15 mg by mouth daily status post 4 months. She is currently on Revlimid 10 mg by mouth daily status post 7 months.  She is tolerating her treatment well. I recommended for her to continue her current treatment with Revlimid. For the diarrhea, advised the patient to take Imodium after every episode of diarrhea for a maximum of 16 mg a day. For the hypokalemia, the patient will continue on potassium chloride supplements. She will come back for follow-up visit in one month for reevaluation with repeat CBC, comprehensive metabolic panel and LDH. For the nausea, I will send a prescription for Compazine 10 mg by mouth every 6 hours as needed to her pharmacy. She was advised to call immediately she has any concerning symptoms in the interval. The patient voices understanding of current disease status and treatment options and is in agreement with the current care plan.  All questions were answered. The patient knows to call the clinic with any problems, questions or concerns. We can certainly see the patient much sooner if necessary.  Disclaimer: This  note was dictated with voice recognition software. Similar sounding words can inadvertently be transcribed and may not be corrected upon review.

## 2015-11-30 NOTE — Telephone Encounter (Signed)
Gave and printed appt sched and avs fo rpt for march °

## 2015-12-28 ENCOUNTER — Ambulatory Visit (HOSPITAL_BASED_OUTPATIENT_CLINIC_OR_DEPARTMENT_OTHER): Payer: 59 | Admitting: Internal Medicine

## 2015-12-28 ENCOUNTER — Other Ambulatory Visit (HOSPITAL_BASED_OUTPATIENT_CLINIC_OR_DEPARTMENT_OTHER): Payer: 59

## 2015-12-28 ENCOUNTER — Encounter: Payer: Self-pay | Admitting: Internal Medicine

## 2015-12-28 ENCOUNTER — Telehealth: Payer: Self-pay | Admitting: Internal Medicine

## 2015-12-28 VITALS — BP 113/65 | HR 45 | Temp 97.8°F | Resp 17 | Ht 66.0 in | Wt 140.0 lb

## 2015-12-28 DIAGNOSIS — C9000 Multiple myeloma not having achieved remission: Secondary | ICD-10-CM | POA: Diagnosis not present

## 2015-12-28 DIAGNOSIS — R197 Diarrhea, unspecified: Secondary | ICD-10-CM | POA: Diagnosis not present

## 2015-12-28 LAB — COMPREHENSIVE METABOLIC PANEL
ALBUMIN: 3.5 g/dL (ref 3.5–5.0)
ALT: 15 U/L (ref 0–55)
ANION GAP: 6 meq/L (ref 3–11)
AST: 16 U/L (ref 5–34)
Alkaline Phosphatase: 61 U/L (ref 40–150)
BILIRUBIN TOTAL: 0.77 mg/dL (ref 0.20–1.20)
BUN: 9.6 mg/dL (ref 7.0–26.0)
CO2: 26 meq/L (ref 22–29)
CREATININE: 0.9 mg/dL (ref 0.6–1.1)
Calcium: 9.1 mg/dL (ref 8.4–10.4)
Chloride: 111 mEq/L — ABNORMAL HIGH (ref 98–109)
EGFR: 81 mL/min/{1.73_m2} — AB (ref >90)
GLUCOSE: 67 mg/dL — AB (ref 70–140)
POTASSIUM: 3.9 meq/L (ref 3.5–5.1)
SODIUM: 143 meq/L (ref 136–145)
TOTAL PROTEIN: 6.9 g/dL (ref 6.4–8.3)

## 2015-12-28 LAB — CBC WITH DIFFERENTIAL/PLATELET
BASO%: 2.3 % — ABNORMAL HIGH (ref 0.0–2.0)
BASOS ABS: 0.1 10*3/uL (ref 0.0–0.1)
EOS ABS: 0.2 10*3/uL (ref 0.0–0.5)
EOS%: 7.1 % — ABNORMAL HIGH (ref 0.0–7.0)
HCT: 35.1 % (ref 34.8–46.6)
HEMOGLOBIN: 11.3 g/dL — AB (ref 11.6–15.9)
LYMPH%: 49.8 % — AB (ref 14.0–49.7)
MCH: 28.9 pg (ref 25.1–34.0)
MCHC: 32.3 g/dL (ref 31.5–36.0)
MCV: 89.7 fL (ref 79.5–101.0)
MONO#: 0.3 10*3/uL (ref 0.1–0.9)
MONO%: 11.9 % (ref 0.0–14.0)
NEUT%: 28.9 % — ABNORMAL LOW (ref 38.4–76.8)
NEUTROS ABS: 0.7 10*3/uL — AB (ref 1.5–6.5)
PLATELETS: 129 10*3/uL — AB (ref 145–400)
RBC: 3.91 10*6/uL (ref 3.70–5.45)
RDW: 17.2 % — AB (ref 11.2–14.5)
WBC: 2.4 10*3/uL — AB (ref 3.9–10.3)
lymph#: 1.2 10*3/uL (ref 0.9–3.3)

## 2015-12-28 LAB — LACTATE DEHYDROGENASE: LDH: 121 U/L — ABNORMAL LOW (ref 125–245)

## 2015-12-28 NOTE — Progress Notes (Signed)
[place] Telephone:(336) [phone]   Fax:(336) [phone]  OFFICE PROGRESS NOTE  [name], MD 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 First dose on 11/15/2013. Status post 5 cycles. 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 3) Maintenance Revlimid 10 mg by mouth daily. Status post 2 months of treatment. 4) Maintenance Revlimid 15 mg by mouth daily. Status post 4 months of treatment.   CURRENT THERAPY: Maintenance Revlimid 10 mg by mouth daily. Status post 8 months.  INTERVAL HISTORY: Victoria Gates 65 y.o. female returns to the clinic today for follow up visit accompanied by her husband. The patient is feeling fine today with no specific complaints except for mild diarrhea improved with Imodium. She is tolerating her treatment with maintenance Revlimid fairly well with no other significant adverse effects. She denied having any significant fever or chills. She denied having any significant chest pain, shortness of breath, cough or hemoptysis. She gained around 8 pounds since her last visit. She has no nausea or vomiting. She had repeat CBC and comprehensive metabolic panel performed earlier today and she is here for evaluation and discussion of her lab results.  MEDICAL HISTORY: Past Medical History  Diagnosis Date  . COLONIC POLYPS, HX OF 04/24/2007    hyperplastic only  . OSTEOPOROSIS 04/24/2007  . GERD 09/21/2010  . DM 03/18/2008  . HYPERCHOLESTEROLEMIA 07/03/2009  . ANEMIA-IRON DEFICIENCY 04/24/2007  . ALLERGIC RHINITIS CAUSE UNSPECIFIED 01/02/2009  . ESOPHAGEAL STRICTURE 04/15/2007    s/p dilitation  . HIATAL HERNIA 04/15/2007  . CERVICAL RADICULOPATHY, LEFT 02/01/2010  . 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 Chest pain 03/2012    a. 04/15/2012 Ex MV: Ex time 10 mins,  EF 63%, no ischemia/infarct.  Occas PAC's/PVC's.  . Hypokalemia 11/30/2015    ALLERGIES:  is allergic to atorvastatin; rosuvastatin; rosuvastatin calcium; sulfamethoxazole; and sulfonamide derivatives.  MEDICATIONS:  Current Outpatient Prescriptions  Medication Sig Dispense Refill  . Ascorbic Acid (VITAMIN C) 1000 MG tablet Take 1,000 mg by mouth.    . Calcium 500-125 MG-UNIT TABS Take 1 tablet by mouth daily.      . cholecalciferol (VITAMIN D) 1000 UNITS tablet Take 1,000 Units by mouth daily.    Marland Kitchen lenalidomide (REVLIMID) 10 MG capsule Take 1 capsule (10 mg total) by mouth daily. 28 capsule 0  . warfarin (COUMADIN) 2 MG tablet TAKE 1 TABLET BY MOUTH EVERY DAY 30 tablet 2  . potassium chloride SA (K-DUR,KLOR-CON) 20 MEQ tablet Take 1 tablet (20 mEq total) by mouth 2 (two) times daily. (Patient not taking: Reported on 12/28/2015) 20 tablet 0  . prochlorperazine (COMPAZINE) 10 MG tablet Take 1 tablet (10 mg total) by mouth every 6 (six) hours as needed for nausea or vomiting. (Patient not taking: Reported on 12/28/2015) 30 tablet 0   No current facility-administered medications for this visit.    SURGICAL HISTORY:  Past Surgical History  Procedure Laterality Date  . Tubal ligation    . Esophagogastroduodenoscopy  04/15/2007    REVIEW OF SYSTEMS:  A comprehensive review of systems was negative except for: Constitutional: positive for fatigue Gastrointestinal: positive for diarrhea   PHYSICAL EXAMINATION: General appearance: alert, cooperative, fatigued and no distress Head: Normocephalic, without obvious abnormality, atraumatic Neck: no adenopathy, no JVD, supple, symmetrical, trachea midline and thyroid not enlarged, symmetric, no tenderness/mass/nodules  Lymph nodes: Cervical, supraclavicular, and axillary nodes normal. Resp: clear to auscultation bilaterally Back: symmetric, no curvature. ROM normal. No CVA tenderness. Cardio: regular rate and rhythm, S1, S2 normal, no murmur, click,  rub or gallop GI: soft, non-tender; bowel sounds normal; no masses,  no organomegaly Extremities: extremities normal, atraumatic, no cyanosis or edema Neurologic: Alert and oriented X 3, normal strength and tone. Normal symmetric reflexes. Normal coordination and gait  ECOG PERFORMANCE STATUS: 1 - Symptomatic but completely ambulatory  Blood pressure 113/65, pulse 45, temperature 97.8 F (36.6 C), temperature source Oral, resp. rate 17, height _0  (1.676 m), weight 140 lb (63.504 kg), SpO2 100 %.  LABORATORY DATA: Lab Results  Component Value Date   WBC 2.4* 12/28/2015   HGB 11.3* 12/28/2015   HCT 35.1 12/28/2015   MCV 89.7 12/28/2015   PLT 129* 12/28/2015      Chemistry      Component Value Date/Time   NA 143 12/28/2015 1058   NA 140 04/14/2013 0916   K 3.9 12/28/2015 1058   K 3.8 04/14/2013 0916   CL 105 04/14/2013 0916   CO2 26 12/28/2015 1058   CO2 28 04/14/2013 0916   BUN 9.6 12/28/2015 1058   BUN 10 04/14/2013 0916   CREATININE 0.9 12/28/2015 1058   CREATININE 1.0 04/14/2013 0916      Component Value Date/Time   CALCIUM 9.1 12/28/2015 1058   CALCIUM 9.7 04/14/2013 0916   CALCIUM 9.0 12/18/2009 2256   ALKPHOS 61 12/28/2015 1058   ALKPHOS 42 04/14/2013 0916   AST 16 12/28/2015 1058   AST 23 04/14/2013 0916   ALT 15 12/28/2015 1058   ALT 24 04/14/2013 0916   BILITOT 0.77 12/28/2015 1058   BILITOT 0.4 04/14/2013 0916     Myeloma panel on 10/26/2015: Beta-2 microglobulin 1.8, Bilateral chain 39.24, free lambda light chain 30.38, /lambda ratio 1.29. IgG 896, IgA 586 and IgM 14  ASSESSMENT AND PLAN: This is a very pleasant 65 years old [place] American female who was recently diagnosed with IgA multiple myeloma with significant elevation of IgA over 7000.  She is currently undergoing systemic chemotherapy with Carfilzomib, Cytoxan and dexamethasone and tolerating it fairly well.  she status post 5 cycles followed by autologous peripheral blood stem cell transplant  in July of 2015. The patient was started on maintenance treatment with Revlimid initially 10 mg by mouth daily for 2 months and currently undergoing maintenance treatment with Revlimid 15 mg by mouth daily status post 4 months. She is currently on Revlimid 10 mg by mouth daily status post 8 months. Her absolute neutrophil count is low today. She is tolerating her treatment well. I recommended for her to continue her current treatment with Revlimid but will give her 7 days break off treatment. For the diarrhea, advised the patient to take Imodium after every episode of diarrhea for a maximum of 16 mg a day. She will come back for follow-up visit in one month for reevaluation with repeat myeloma panel. She was advised to call immediately she has any concerning symptoms in the interval. The patient voices understanding of current disease status and treatment options and is in agreement with the current care plan.  All questions were answered. The patient knows to call the clinic with any problems, questions or concerns. We can certainly see the patient much sooner if necessary.  Disclaimer: This note was dictated with voice recognition software. Similar sounding words can inadvertently be transcribed and may not be corrected upon  review.

## 2015-12-28 NOTE — Telephone Encounter (Signed)
per pof to sch pt appt-gae pt copyog avs

## 2016-01-02 ENCOUNTER — Other Ambulatory Visit: Payer: Self-pay | Admitting: *Deleted

## 2016-01-02 DIAGNOSIS — C9000 Multiple myeloma not having achieved remission: Secondary | ICD-10-CM

## 2016-01-02 MED ORDER — LENALIDOMIDE 10 MG PO CAPS: 10.0000 mg | ORAL_CAPSULE | Freq: Every day | ORAL | Status: DC

## 2016-01-02 NOTE — Telephone Encounter (Signed)
Revlimid 10mg  rx escribed and faxed to [facility]

## 2016-01-12 ENCOUNTER — Other Ambulatory Visit: Payer: Self-pay | Admitting: Medical Oncology

## 2016-01-12 ENCOUNTER — Telehealth: Payer: Self-pay | Admitting: Medical Oncology

## 2016-01-12 DIAGNOSIS — C9000 Multiple myeloma not having achieved remission: Secondary | ICD-10-CM

## 2016-01-12 MED ORDER — WARFARIN SODIUM 2 MG PO TABS: 2.0000 mg | ORAL_TABLET | Freq: Every day | ORAL | Status: DC

## 2016-01-12 NOTE — Telephone Encounter (Signed)
Has not taken coumadin since wed. Refill called in and told her to take it at her usual time today.

## 2016-01-18 ENCOUNTER — Telehealth: Payer: Self-pay | Admitting: Internal Medicine

## 2016-01-18 ENCOUNTER — Other Ambulatory Visit (HOSPITAL_BASED_OUTPATIENT_CLINIC_OR_DEPARTMENT_OTHER): Payer: 59

## 2016-01-18 ENCOUNTER — Telehealth: Payer: Self-pay | Admitting: Medical Oncology

## 2016-01-18 DIAGNOSIS — C9000 Multiple myeloma not having achieved remission: Secondary | ICD-10-CM | POA: Diagnosis not present

## 2016-01-18 LAB — CBC WITH DIFFERENTIAL/PLATELET
BASO%: 1.5 % (ref 0.0–2.0)
Basophils Absolute: 0 10*3/uL (ref 0.0–0.1)
EOS%: 9.4 % — AB (ref 0.0–7.0)
Eosinophils Absolute: 0.2 10*3/uL (ref 0.0–0.5)
HCT: 34.5 % — ABNORMAL LOW (ref 34.8–46.6)
HGB: 11.3 g/dL — ABNORMAL LOW (ref 11.6–15.9)
LYMPH%: 56.1 % — AB (ref 14.0–49.7)
MCH: 29.1 pg (ref 25.1–34.0)
MCHC: 32.6 g/dL (ref 31.5–36.0)
MCV: 89.1 fL (ref 79.5–101.0)
MONO#: 0.3 10*3/uL (ref 0.1–0.9)
MONO%: 12.5 % (ref 0.0–14.0)
NEUT%: 20.5 % — AB (ref 38.4–76.8)
NEUTROS ABS: 0.5 10*3/uL — AB (ref 1.5–6.5)
Platelets: 113 10*3/uL — ABNORMAL LOW (ref 145–400)
RBC: 3.87 10*6/uL (ref 3.70–5.45)
RDW: 16.1 % — ABNORMAL HIGH (ref 11.2–14.5)
WBC: 2.5 10*3/uL — AB (ref 3.9–10.3)
lymph#: 1.4 10*3/uL (ref 0.9–3.3)

## 2016-01-18 LAB — COMPREHENSIVE METABOLIC PANEL
ALT: 25 U/L (ref 0–55)
ANION GAP: 8 meq/L (ref 3–11)
AST: 22 U/L (ref 5–34)
Albumin: 3.4 g/dL — ABNORMAL LOW (ref 3.5–5.0)
Alkaline Phosphatase: 61 U/L (ref 40–150)
BILIRUBIN TOTAL: 0.81 mg/dL (ref 0.20–1.20)
BUN: 10.5 mg/dL (ref 7.0–26.0)
CHLORIDE: 109 meq/L (ref 98–109)
CO2: 26 meq/L (ref 22–29)
CREATININE: 0.9 mg/dL (ref 0.6–1.1)
Calcium: 9.2 mg/dL (ref 8.4–10.4)
EGFR: 79 mL/min/{1.73_m2} — ABNORMAL LOW (ref >90)
GLUCOSE: 86 mg/dL (ref 70–140)
Potassium: 3.8 mEq/L (ref 3.5–5.1)
SODIUM: 143 meq/L (ref 136–145)
TOTAL PROTEIN: 6.7 g/dL (ref 6.4–8.3)

## 2016-01-18 LAB — LACTATE DEHYDROGENASE: LDH: 121 U/L — AB (ref 125–245)

## 2016-01-18 NOTE — Telephone Encounter (Signed)
Per Dr. Julien Nordmann I left a voice message to pt to hold revlimid.

## 2016-01-18 NOTE — Telephone Encounter (Signed)
spoke w/ pt confirmed added lab on 4/27

## 2016-01-19 LAB — KAPPA/LAMBDA LIGHT CHAINS
Ig Kappa Free Light Chain: 47.63 mg/L — ABNORMAL HIGH (ref 3.30–19.40)
Ig Lambda Free Light Chain: 38.8 mg/L — ABNORMAL HIGH (ref 5.71–26.30)
KAPPA/LAMBDA FLC RATIO: 1.23 (ref 0.26–1.65)

## 2016-01-19 LAB — IGG, IGA, IGM
IGM (IMMUNOGLOBIN M), SRM: 11 mg/dL — AB (ref 26–217)
IgA, Qn, Serum: 500 mg/dL — ABNORMAL HIGH (ref 87–352)
IgG, Qn, Serum: 1106 mg/dL (ref 700–1600)

## 2016-01-19 LAB — BETA 2 MICROGLOBULIN, SERUM: Beta-2: 1.5 mg/L (ref 0.6–2.4)

## 2016-01-25 ENCOUNTER — Other Ambulatory Visit (HOSPITAL_BASED_OUTPATIENT_CLINIC_OR_DEPARTMENT_OTHER): Payer: 59

## 2016-01-25 ENCOUNTER — Ambulatory Visit (HOSPITAL_BASED_OUTPATIENT_CLINIC_OR_DEPARTMENT_OTHER): Payer: 59

## 2016-01-25 ENCOUNTER — Ambulatory Visit (HOSPITAL_BASED_OUTPATIENT_CLINIC_OR_DEPARTMENT_OTHER): Payer: 59 | Admitting: Internal Medicine

## 2016-01-25 ENCOUNTER — Telehealth: Payer: Self-pay | Admitting: Internal Medicine

## 2016-01-25 ENCOUNTER — Encounter: Payer: Self-pay | Admitting: Internal Medicine

## 2016-01-25 VITALS — BP 130/55 | HR 57 | Temp 98.1°F | Resp 18 | Ht 66.0 in | Wt 143.3 lb

## 2016-01-25 DIAGNOSIS — D701 Agranulocytosis secondary to cancer chemotherapy: Secondary | ICD-10-CM

## 2016-01-25 DIAGNOSIS — T451X5A Adverse effect of antineoplastic and immunosuppressive drugs, initial encounter: Secondary | ICD-10-CM

## 2016-01-25 DIAGNOSIS — C9000 Multiple myeloma not having achieved remission: Secondary | ICD-10-CM | POA: Diagnosis not present

## 2016-01-25 LAB — CBC WITH DIFFERENTIAL/PLATELET
BASO%: 1.1 % (ref 0.0–2.0)
BASOS ABS: 0 10*3/uL (ref 0.0–0.1)
EOS ABS: 0.2 10*3/uL (ref 0.0–0.5)
EOS%: 5.5 % (ref 0.0–7.0)
HCT: 31.6 % — ABNORMAL LOW (ref 34.8–46.6)
HGB: 10.5 g/dL — ABNORMAL LOW (ref 11.6–15.9)
LYMPH%: 62.9 % — AB (ref 14.0–49.7)
MCH: 29.5 pg (ref 25.1–34.0)
MCHC: 33.2 g/dL (ref 31.5–36.0)
MCV: 88.8 fL (ref 79.5–101.0)
MONO#: 0.3 10*3/uL (ref 0.1–0.9)
MONO%: 11 % (ref 0.0–14.0)
NEUT%: 19.5 % — ABNORMAL LOW (ref 38.4–76.8)
NEUTROS ABS: 0.5 10*3/uL — AB (ref 1.5–6.5)
NRBC: 0 % (ref 0–0)
PLATELETS: 118 10*3/uL — AB (ref 145–400)
RBC: 3.56 10*6/uL — AB (ref 3.70–5.45)
RDW: 16.1 % — AB (ref 11.2–14.5)
WBC: 2.7 10*3/uL — AB (ref 3.9–10.3)
lymph#: 1.7 10*3/uL (ref 0.9–3.3)

## 2016-01-25 MED ORDER — TBO-FILGRASTIM 300 MCG/0.5ML ~~LOC~~ SOSY
300.0000 ug | PREFILLED_SYRINGE | Freq: Once | SUBCUTANEOUS | Status: AC
  Administered 2016-01-25: 300 ug via SUBCUTANEOUS
  Filled 2016-01-25: qty 0.5

## 2016-01-25 NOTE — Progress Notes (Signed)
[place] Telephone:(336) [phone]   Fax:(336) [phone]  OFFICE PROGRESS NOTE  [name], MD 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 First dose on 11/15/2013. Status post 5 cycles. 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 3) Maintenance Revlimid 10 mg by mouth daily. Status post 2 months of treatment. 4) Maintenance Revlimid 15 mg by mouth daily. Status post 4 months of treatment.   CURRENT THERAPY: Maintenance Revlimid 10 mg by mouth daily. Status post 9 months.  INTERVAL HISTORY: Victoria Gates 65 y.o. female returns to the clinic today for follow up visit accompanied by her husband. The patient is feeling fine today with no specific complaints. She is tolerating her treatment with maintenance Revlimid fairly well with no other significant adverse effects except for the chemotherapy-induced neutropenia. She denied having any significant fever or chills. She denied having any significant chest pain, shortness of breath, cough or hemoptysis. She gained around 3 pounds since her last visit. She has no nausea or vomiting. She had repeat myeloma panel performed recently and she is here for evaluation and discussion of her lab results.  MEDICAL HISTORY: Past Medical History  Diagnosis Date  . COLONIC POLYPS, HX OF 04/24/2007    hyperplastic only  . OSTEOPOROSIS 04/24/2007  . GERD 09/21/2010  . DM 03/18/2008  . HYPERCHOLESTEROLEMIA 07/03/2009  . ANEMIA-IRON DEFICIENCY 04/24/2007  . ALLERGIC RHINITIS CAUSE UNSPECIFIED 01/02/2009  . ESOPHAGEAL STRICTURE 04/15/2007    s/p dilitation  . HIATAL HERNIA 04/15/2007  . CERVICAL RADICULOPATHY, LEFT 02/01/2010  . 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 Chest pain 03/2012    a. 04/15/2012 Ex MV: Ex time 10 mins, EF 63%, no ischemia/infarct.   Occas PAC's/PVC's.  . Hypokalemia 11/30/2015    ALLERGIES:  is allergic to atorvastatin; rosuvastatin; rosuvastatin calcium; sulfamethoxazole; and sulfonamide derivatives.  MEDICATIONS:  Current Outpatient Prescriptions  Medication Sig Dispense Refill  . Ascorbic Acid (VITAMIN C) 1000 MG tablet Take 1,000 mg by mouth.    . Calcium 500-125 MG-UNIT TABS Take 1 tablet by mouth daily.      . cholecalciferol (VITAMIN D) 1000 UNITS tablet Take 1,000 Units by mouth daily.    Marland Kitchen lenalidomide (REVLIMID) 10 MG capsule Take 1 capsule (10 mg total) by mouth daily. 28 capsule 0  . warfarin (COUMADIN) 2 MG tablet Take 1 tablet (2 mg total) by mouth daily. 30 tablet 2  . potassium chloride SA (K-DUR,KLOR-CON) 20 MEQ tablet Take 1 tablet (20 mEq total) by mouth 2 (two) times daily. (Patient not taking: Reported on 12/28/2015) 20 tablet 0  . prochlorperazine (COMPAZINE) 10 MG tablet Take 1 tablet (10 mg total) by mouth every 6 (six) hours as needed for nausea or vomiting. (Patient not taking: Reported on 12/28/2015) 30 tablet 0   No current facility-administered medications for this visit.    SURGICAL HISTORY:  Past Surgical History  Procedure Laterality Date  . Tubal ligation    . Esophagogastroduodenoscopy  04/15/2007    REVIEW OF SYSTEMS:  Constitutional: negative Eyes: negative Ears, nose, mouth, throat, and face: negative Respiratory: negative Cardiovascular: negative Gastrointestinal: negative Genitourinary:negative Integument/breast: negative Hematologic/lymphatic: negative Musculoskeletal:negative Neurological: negative Behavioral/Psych: negative Endocrine: negative Allergic/Immunologic: negative   PHYSICAL EXAMINATION: General appearance: alert, cooperative, fatigued and no distress Head: Normocephalic, without obvious abnormality, atraumatic Neck: no adenopathy, no JVD, supple,  symmetrical, trachea midline and thyroid not enlarged, symmetric, no tenderness/mass/nodules Lymph nodes:  Cervical, supraclavicular, and axillary nodes normal. Resp: clear to auscultation bilaterally Back: symmetric, no curvature. ROM normal. No CVA tenderness. Cardio: regular rate and rhythm, S1, S2 normal, no murmur, click, rub or gallop GI: soft, non-tender; bowel sounds normal; no masses,  no organomegaly Extremities: extremities normal, atraumatic, no cyanosis or edema Neurologic: Alert and oriented X 3, normal strength and tone. Normal symmetric reflexes. Normal coordination and gait  ECOG PERFORMANCE STATUS: 1 - Symptomatic but completely ambulatory  Blood pressure 130/55, pulse 57, temperature 98.1 F (36.7 C), temperature source Oral, resp. rate 18, height 5\' 6"  (1.676 m), weight 143 lb 4.8 oz (65 kg), SpO2 100 %.  LABORATORY DATA: Lab Results  Component Value Date   WBC 2.7* 01/25/2016   HGB 10.5* 01/25/2016   HCT 31.6* 01/25/2016   MCV 88.8 01/25/2016   PLT 118* 01/25/2016      Chemistry      Component Value Date/Time   NA 143 01/18/2016 0748   NA 140 04/14/2013 0916   K 3.8 01/18/2016 0748   K 3.8 04/14/2013 0916   CL 105 04/14/2013 0916   CO2 26 01/18/2016 0748   CO2 28 04/14/2013 0916   BUN 10.5 01/18/2016 0748   BUN 10 04/14/2013 0916   CREATININE 0.9 01/18/2016 0748   CREATININE 1.0 04/14/2013 0916      Component Value Date/Time   CALCIUM 9.2 01/18/2016 0748   CALCIUM 9.7 04/14/2013 0916   CALCIUM 9.0 12/18/2009 2256   ALKPHOS 61 01/18/2016 0748   ALKPHOS 42 04/14/2013 0916   AST 22 01/18/2016 0748   AST 23 04/14/2013 0916   ALT 25 01/18/2016 0748   ALT 24 04/14/2013 0916   BILITOT 0.81 01/18/2016 0748   BILITOT 0.4 04/14/2013 0916     Myeloma panel on 01/18/2016: Beta-2 microglobulin 1.5, Bilateral chain 47.63, free lambda light chain 38.80, /lambda ratio 1.23. IgG 1106, IgA 500 and IgM 11  ASSESSMENT AND PLAN: This is a very pleasant 66 years old 77 American female who was recently diagnosed with IgA multiple myeloma with significant elevation  of IgA over 7000.  She is currently undergoing systemic chemotherapy with Carfilzomib, Cytoxan and dexamethasone and tolerating it fairly well.  she status post 5 cycles followed by autologous peripheral blood stem cell transplant in July of 2015. The patient was started on maintenance treatment with Revlimid initially 10 mg by mouth daily for 2 months and currently undergoing maintenance treatment with Revlimid 15 mg by mouth daily status post 4 months. She is currently on Revlimid 10 mg by mouth daily status post 9 months.  Her myeloma panel showed no clear evidence for disease progression. The patient continues to have persistent chemotherapy-induced neutropenia. I recommended for her to continue her current treatment with Revlimid starting 01/29/2016. For the chemotherapy-induced neutropenia, I will arrange for the patient to receive Granix 300 mcg subcutaneously for 3 days. She will come back for follow-up visit in one month for reevaluation with repeat CBC, comprehensive metabolic panel and LDH. She was advised to call immediately she has any concerning symptoms in the interval. The patient voices understanding of current disease status and treatment options and is in agreement with the current care plan.  All questions were answered. The patient knows to call the clinic with any problems, questions or concerns. We can certainly see the patient much sooner if necessary.  Disclaimer: This note was dictated with voice recognition software. Similar  sounding words can inadvertently be transcribed and may not be corrected upon review.

## 2016-01-25 NOTE — Patient Instructions (Signed)

## 2016-01-25 NOTE — Telephone Encounter (Signed)
Gave pt appt & avs °

## 2016-01-26 ENCOUNTER — Ambulatory Visit (HOSPITAL_BASED_OUTPATIENT_CLINIC_OR_DEPARTMENT_OTHER): Payer: 59

## 2016-01-26 VITALS — BP 130/70 | HR 61 | Temp 98.5°F

## 2016-01-26 DIAGNOSIS — D701 Agranulocytosis secondary to cancer chemotherapy: Secondary | ICD-10-CM

## 2016-01-26 DIAGNOSIS — C9000 Multiple myeloma not having achieved remission: Secondary | ICD-10-CM | POA: Diagnosis not present

## 2016-01-26 MED ORDER — TBO-FILGRASTIM 300 MCG/0.5ML ~~LOC~~ SOSY
300.0000 ug | PREFILLED_SYRINGE | Freq: Once | SUBCUTANEOUS | Status: AC
  Administered 2016-01-26: 300 ug via SUBCUTANEOUS
  Filled 2016-01-26: qty 0.5

## 2016-01-27 ENCOUNTER — Ambulatory Visit (HOSPITAL_BASED_OUTPATIENT_CLINIC_OR_DEPARTMENT_OTHER): Payer: 59

## 2016-01-27 VITALS — BP 127/84 | HR 65 | Temp 98.7°F | Resp 18

## 2016-01-27 DIAGNOSIS — D701 Agranulocytosis secondary to cancer chemotherapy: Secondary | ICD-10-CM | POA: Diagnosis not present

## 2016-01-27 DIAGNOSIS — C9000 Multiple myeloma not having achieved remission: Secondary | ICD-10-CM | POA: Diagnosis not present

## 2016-01-27 MED ORDER — TBO-FILGRASTIM 300 MCG/0.5ML ~~LOC~~ SOSY
300.0000 ug | PREFILLED_SYRINGE | Freq: Once | SUBCUTANEOUS | Status: AC
  Administered 2016-01-27: 300 ug via SUBCUTANEOUS

## 2016-01-27 NOTE — Patient Instructions (Signed)

## 2016-02-02 ENCOUNTER — Telehealth: Payer: Self-pay | Admitting: Internal Medicine

## 2016-02-02 ENCOUNTER — Other Ambulatory Visit: Payer: Self-pay | Admitting: *Deleted

## 2016-02-02 DIAGNOSIS — C9000 Multiple myeloma not having achieved remission: Secondary | ICD-10-CM

## 2016-02-02 MED ORDER — LENALIDOMIDE 10 MG PO CAPS: 10.0000 mg | ORAL_CAPSULE | Freq: Every day | ORAL | Status: DC

## 2016-02-02 NOTE — Telephone Encounter (Signed)
Lvm advising appt chg from 5/23 to 6/5 at 1pm due to md pal.

## 2016-02-06 ENCOUNTER — Other Ambulatory Visit: Payer: Self-pay | Admitting: Medical Oncology

## 2016-02-20 ENCOUNTER — Ambulatory Visit: Payer: 59 | Admitting: Internal Medicine

## 2016-02-20 ENCOUNTER — Other Ambulatory Visit: Payer: 59

## 2016-03-04 ENCOUNTER — Telehealth: Payer: Self-pay | Admitting: Internal Medicine

## 2016-03-04 ENCOUNTER — Other Ambulatory Visit: Payer: Self-pay | Admitting: Medical Oncology

## 2016-03-04 ENCOUNTER — Encounter: Payer: Self-pay | Admitting: Internal Medicine

## 2016-03-04 ENCOUNTER — Ambulatory Visit (HOSPITAL_BASED_OUTPATIENT_CLINIC_OR_DEPARTMENT_OTHER): Payer: 59 | Admitting: Internal Medicine

## 2016-03-04 ENCOUNTER — Other Ambulatory Visit (HOSPITAL_BASED_OUTPATIENT_CLINIC_OR_DEPARTMENT_OTHER): Payer: 59

## 2016-03-04 VITALS — BP 120/67 | HR 55 | Temp 98.2°F | Resp 18 | Ht 66.0 in | Wt 142.4 lb

## 2016-03-04 DIAGNOSIS — T451X5A Adverse effect of antineoplastic and immunosuppressive drugs, initial encounter: Secondary | ICD-10-CM

## 2016-03-04 DIAGNOSIS — C9000 Multiple myeloma not having achieved remission: Secondary | ICD-10-CM

## 2016-03-04 DIAGNOSIS — D701 Agranulocytosis secondary to cancer chemotherapy: Secondary | ICD-10-CM

## 2016-03-04 LAB — CBC WITH DIFFERENTIAL/PLATELET
BASO%: 1.5 % (ref 0.0–2.0)
Basophils Absolute: 0.1 10*3/uL (ref 0.0–0.1)
EOS%: 3.8 % (ref 0.0–7.0)
Eosinophils Absolute: 0.2 10*3/uL (ref 0.0–0.5)
HCT: 35.3 % (ref 34.8–46.6)
HGB: 11.7 g/dL (ref 11.6–15.9)
LYMPH%: 50.8 % — AB (ref 14.0–49.7)
MCH: 29.5 pg (ref 25.1–34.0)
MCHC: 33.1 g/dL (ref 31.5–36.0)
MCV: 88.9 fL (ref 79.5–101.0)
MONO#: 0.5 10*3/uL (ref 0.1–0.9)
MONO%: 13.1 % (ref 0.0–14.0)
NEUT%: 30.8 % — ABNORMAL LOW (ref 38.4–76.8)
NEUTROS ABS: 1.2 10*3/uL — AB (ref 1.5–6.5)
Platelets: 165 10*3/uL (ref 145–400)
RBC: 3.97 10*6/uL (ref 3.70–5.45)
RDW: 14.6 % — ABNORMAL HIGH (ref 11.2–14.5)
WBC: 3.9 10*3/uL (ref 3.9–10.3)
lymph#: 2 10*3/uL (ref 0.9–3.3)

## 2016-03-04 LAB — COMPREHENSIVE METABOLIC PANEL
ALT: 18 U/L (ref 0–55)
AST: 17 U/L (ref 5–34)
Albumin: 3.6 g/dL (ref 3.5–5.0)
Alkaline Phosphatase: 67 U/L (ref 40–150)
Anion Gap: 6 mEq/L (ref 3–11)
BILIRUBIN TOTAL: 0.58 mg/dL (ref 0.20–1.20)
BUN: 10.5 mg/dL (ref 7.0–26.0)
CO2: 27 meq/L (ref 22–29)
Calcium: 9 mg/dL (ref 8.4–10.4)
Chloride: 108 mEq/L (ref 98–109)
Creatinine: 1 mg/dL (ref 0.6–1.1)
EGFR: 66 mL/min/{1.73_m2} — AB (ref >90)
GLUCOSE: 92 mg/dL (ref 70–140)
Potassium: 3.8 mEq/L (ref 3.5–5.1)
SODIUM: 141 meq/L (ref 136–145)
TOTAL PROTEIN: 7.1 g/dL (ref 6.4–8.3)

## 2016-03-04 LAB — LACTATE DEHYDROGENASE: LDH: 117 U/L — ABNORMAL LOW (ref 125–245)

## 2016-03-04 MED ORDER — LENALIDOMIDE 10 MG PO CAPS: 10.0000 mg | ORAL_CAPSULE | Freq: Every day | ORAL | Status: DC

## 2016-03-04 NOTE — Progress Notes (Signed)
[name] Telephone:(336) [phone]   Fax:(336) [phone]  OFFICE PROGRESS NOTE  [name], MD 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 First dose on 11/15/2013. Status post 5 cycles. 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 3) Maintenance Revlimid 10 mg by mouth daily. Status post 2 months of treatment. 4) Maintenance Revlimid 15 mg by mouth daily. Status post 4 months of treatment.   CURRENT THERAPY: Maintenance Revlimid 10 mg by mouth daily. Status post 10 months.  INTERVAL HISTORY: Victoria Gates 65 y.o. female returns to the clinic today for follow up visit accompanied by her husband. The patient is feeling fine today with no specific complaints. She is tolerating her treatment with maintenance Revlimid fairly well with no other significant adverse effects except for mild chemotherapy-induced neutropenia. She denied having any significant fever or chills. She denied having any significant chest pain, shortness of breath, cough or hemoptysis. She has no nausea or vomiting. She had repeat CBC, complaints metabolic panel and LDH performed earlier today and she is here for evaluation and discussion of her lab results.  MEDICAL HISTORY: Past Medical History  Diagnosis Date  . COLONIC POLYPS, HX OF 04/24/2007    hyperplastic only  . OSTEOPOROSIS 04/24/2007  . GERD 09/21/2010  . DM 03/18/2008  . HYPERCHOLESTEROLEMIA 07/03/2009  . ANEMIA-IRON DEFICIENCY 04/24/2007  . ALLERGIC RHINITIS CAUSE UNSPECIFIED 01/02/2009  . ESOPHAGEAL STRICTURE 04/15/2007    s/p dilitation  . HIATAL HERNIA 04/15/2007  . CERVICAL RADICULOPATHY, LEFT 02/01/2010  . 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 Chest pain 03/2012    a. 04/15/2012 Ex MV: Ex time 10 mins, EF 63%, no ischemia/infarct.  Occas  PAC's/PVC's.  . Hypokalemia 11/30/2015    ALLERGIES:  is allergic to atorvastatin; rosuvastatin; rosuvastatin calcium; sulfamethoxazole; and sulfonamide derivatives.  MEDICATIONS:  Current Outpatient Prescriptions  Medication Sig Dispense Refill  . Ascorbic Acid (VITAMIN C) 1000 MG tablet Take 1,000 mg by mouth.    . Calcium 500-125 MG-UNIT TABS Take 1 tablet by mouth daily.      . cholecalciferol (VITAMIN D) 1000 UNITS tablet Take 1,000 Units by mouth daily.    Marland Kitchen lenalidomide (REVLIMID) 10 MG capsule Take 1 capsule (10 mg total) by mouth daily. 28 capsule 0  . potassium chloride SA (K-DUR,KLOR-CON) 20 MEQ tablet Take 1 tablet (20 mEq total) by mouth 2 (two) times daily. (Patient not taking: Reported on 12/28/2015) 20 tablet 0  . prochlorperazine (COMPAZINE) 10 MG tablet Take 1 tablet (10 mg total) by mouth every 6 (six) hours as needed for nausea or vomiting. (Patient not taking: Reported on 12/28/2015) 30 tablet 0  . warfarin (COUMADIN) 2 MG tablet Take 1 tablet (2 mg total) by mouth daily. 30 tablet 2   No current facility-administered medications for this visit.    SURGICAL HISTORY:  Past Surgical History  Procedure Laterality Date  . Tubal ligation    . Esophagogastroduodenoscopy  04/15/2007    REVIEW OF SYSTEMS:  A comprehensive review of systems was negative.   PHYSICAL EXAMINATION: General appearance: alert, cooperative, fatigued and no distress Head: Normocephalic, without obvious abnormality, atraumatic Neck: no adenopathy, no JVD, supple, symmetrical, trachea midline and thyroid not enlarged, symmetric, no tenderness/mass/nodules Lymph nodes: Cervical, supraclavicular, and axillary nodes normal. Resp: clear to auscultation bilaterally Back: symmetric, no curvature. ROM  normal. No CVA tenderness. Cardio: regular rate and rhythm, S1, S2 normal, no murmur, click, rub or gallop GI: soft, non-tender; bowel sounds normal; no masses,  no organomegaly Extremities: extremities  normal, atraumatic, no cyanosis or edema Neurologic: Alert and oriented X 3, normal strength and tone. Normal symmetric reflexes. Normal coordination and gait  ECOG PERFORMANCE STATUS: 1 - Symptomatic but completely ambulatory  Blood pressure 120/67, pulse 55, temperature 98.2 F (36.8 C), temperature source Oral, resp. rate 18, height _0  (1.676 m), weight 142 lb 6.4 oz (64.592 kg), SpO2 100 %.  LABORATORY DATA: Lab Results  Component Value Date   WBC 3.9 03/04/2016   HGB 11.7 03/04/2016   HCT 35.3 03/04/2016   MCV 88.9 03/04/2016   PLT 165 03/04/2016      Chemistry      Component Value Date/Time   NA 141 03/04/2016 1236   NA 140 04/14/2013 0916   K 3.8 03/04/2016 1236   K 3.8 04/14/2013 0916   CL 105 04/14/2013 0916   CO2 27 03/04/2016 1236   CO2 28 04/14/2013 0916   BUN 10.5 03/04/2016 1236   BUN 10 04/14/2013 0916   CREATININE 1.0 03/04/2016 1236   CREATININE 1.0 04/14/2013 0916      Component Value Date/Time   CALCIUM 9.0 03/04/2016 1236   CALCIUM 9.7 04/14/2013 0916   CALCIUM 9.0 12/18/2009 2256   ALKPHOS 67 03/04/2016 1236   ALKPHOS 42 04/14/2013 0916   AST 17 03/04/2016 1236   AST 23 04/14/2013 0916   ALT 18 03/04/2016 1236   ALT 24 04/14/2013 0916   BILITOT 0.58 03/04/2016 1236   BILITOT 0.4 04/14/2013 0916     Myeloma panel on 01/18/2016: Beta-2 microglobulin 1.5, free Kappa chain 47.63, free lambda light chain 38.80,Kappa /lambda ratio 1.23. IgG 1106, IgA 500 and IgM 11  ASSESSMENT AND PLAN: This is a very pleasant 65 years old [place] American female who was recently diagnosed with IgA multiple myeloma with significant elevation of IgA over 7000.  She is currently undergoing systemic chemotherapy with Carfilzomib, Cytoxan and dexamethasone and tolerating it fairly well.  she status post 5 cycles followed by autologous peripheral blood stem cell transplant in July of 2015. The patient was started on maintenance treatment with Revlimid initially 10 mg by  mouth daily for 2 months and currently undergoing maintenance treatment with Revlimid 15 mg by mouth daily status post 4 months. She is currently on Revlimid 10 mg by mouth daily status post 10 months.  Her lab work is unremarkable today except for mild neutropenia. I recommended for her to continue her current treatment with Revlimid. She will come back for follow-up visit in one month for reevaluation with repeat CBC, comprehensive metabolic panel and LDH. She was advised to call immediately she has any concerning symptoms in the interval. The patient voices understanding of current disease status and treatment options and is in agreement with the current care plan.  All questions were answered. The patient knows to call the clinic with any problems, questions or concerns. We can certainly see the patient much sooner if necessary.  Disclaimer: This note was dictated with voice recognition software. Similar sounding words can inadvertently be transcribed and may not be corrected upon review.

## 2016-03-04 NOTE — Telephone Encounter (Signed)
Gave pt avs, pt sched 7:45 lab because lab only apt

## 2016-03-22 ENCOUNTER — Other Ambulatory Visit (HOSPITAL_BASED_OUTPATIENT_CLINIC_OR_DEPARTMENT_OTHER): Payer: 59

## 2016-03-22 DIAGNOSIS — C9000 Multiple myeloma not having achieved remission: Secondary | ICD-10-CM | POA: Diagnosis not present

## 2016-03-22 LAB — CBC WITH DIFFERENTIAL/PLATELET
BASO%: 2.2 % — ABNORMAL HIGH (ref 0.0–2.0)
BASOS ABS: 0.1 10*3/uL (ref 0.0–0.1)
EOS ABS: 0.2 10*3/uL (ref 0.0–0.5)
EOS%: 5.1 % (ref 0.0–7.0)
HCT: 37.3 % (ref 34.8–46.6)
HEMOGLOBIN: 12.1 g/dL (ref 11.6–15.9)
LYMPH%: 57.7 % — ABNORMAL HIGH (ref 14.0–49.7)
MCH: 29.1 pg (ref 25.1–34.0)
MCHC: 32.4 g/dL (ref 31.5–36.0)
MCV: 89.8 fL (ref 79.5–101.0)
MONO#: 0.3 10*3/uL (ref 0.1–0.9)
MONO%: 11.5 % (ref 0.0–14.0)
NEUT#: 0.7 10*3/uL — ABNORMAL LOW (ref 1.5–6.5)
NEUT%: 23.5 % — AB (ref 38.4–76.8)
Platelets: 134 10*3/uL — ABNORMAL LOW (ref 145–400)
RBC: 4.15 10*6/uL (ref 3.70–5.45)
RDW: 14.6 % — AB (ref 11.2–14.5)
WBC: 3 10*3/uL — ABNORMAL LOW (ref 3.9–10.3)
lymph#: 1.7 10*3/uL (ref 0.9–3.3)

## 2016-03-22 LAB — COMPREHENSIVE METABOLIC PANEL
ALT: 21 U/L (ref 0–55)
AST: 17 U/L (ref 5–34)
Albumin: 3.7 g/dL (ref 3.5–5.0)
Alkaline Phosphatase: 61 U/L (ref 40–150)
Anion Gap: 9 mEq/L (ref 3–11)
BILIRUBIN TOTAL: 0.7 mg/dL (ref 0.20–1.20)
BUN: 9.3 mg/dL (ref 7.0–26.0)
CO2: 25 meq/L (ref 22–29)
Calcium: 9.3 mg/dL (ref 8.4–10.4)
Chloride: 108 mEq/L (ref 98–109)
Creatinine: 1 mg/dL (ref 0.6–1.1)
EGFR: 72 mL/min/{1.73_m2} — AB (ref >90)
GLUCOSE: 85 mg/dL (ref 70–140)
Potassium: 3.9 mEq/L (ref 3.5–5.1)
SODIUM: 142 meq/L (ref 136–145)
TOTAL PROTEIN: 7.3 g/dL (ref 6.4–8.3)

## 2016-03-22 LAB — LACTATE DEHYDROGENASE: LDH: 119 U/L — ABNORMAL LOW (ref 125–245)

## 2016-03-25 ENCOUNTER — Other Ambulatory Visit: Payer: 59

## 2016-04-01 ENCOUNTER — Encounter: Payer: Self-pay | Admitting: Internal Medicine

## 2016-04-01 ENCOUNTER — Other Ambulatory Visit: Payer: Self-pay | Admitting: *Deleted

## 2016-04-01 ENCOUNTER — Ambulatory Visit (HOSPITAL_BASED_OUTPATIENT_CLINIC_OR_DEPARTMENT_OTHER): Payer: 59 | Admitting: Internal Medicine

## 2016-04-01 ENCOUNTER — Telehealth: Payer: Self-pay | Admitting: Internal Medicine

## 2016-04-01 VITALS — BP 105/67 | HR 47 | Temp 98.0°F | Resp 18 | Ht 66.0 in | Wt 143.8 lb

## 2016-04-01 DIAGNOSIS — D701 Agranulocytosis secondary to cancer chemotherapy: Secondary | ICD-10-CM | POA: Diagnosis not present

## 2016-04-01 DIAGNOSIS — C9000 Multiple myeloma not having achieved remission: Secondary | ICD-10-CM | POA: Diagnosis not present

## 2016-04-01 DIAGNOSIS — T451X5A Adverse effect of antineoplastic and immunosuppressive drugs, initial encounter: Secondary | ICD-10-CM

## 2016-04-01 MED ORDER — TBO-FILGRASTIM 300 MCG/0.5ML ~~LOC~~ SOSY
300.0000 ug | PREFILLED_SYRINGE | Freq: Once | SUBCUTANEOUS | Status: AC
  Administered 2016-04-01: 300 ug via SUBCUTANEOUS
  Filled 2016-04-01: qty 0.5

## 2016-04-01 NOTE — Addendum Note (Signed)
Addended by: Lucile Crater on: 04/01/2016 09:15 AM   Modules accepted: Orders

## 2016-04-01 NOTE — Progress Notes (Signed)
[place] [phone]   [phone]  OFFICE PROGRESS NOTE  [name], MD 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 First dose on 11/15/2013. Status post 5 cycles. 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 3) Maintenance Revlimid 10 mg by mouth daily. Status post 2 months of treatment. 4) Maintenance Revlimid 15 mg by mouth daily. Status post 4 months of treatment.   CURRENT THERAPY: Maintenance Revlimid 10 mg by mouth daily. Status post 11 months.  INTERVAL HISTORY: Victoria Gates 65 y.o. female returns to the clinic today for follow up visit accompanied by her husband. The patient is feeling fine today with no specific complaints. She is tolerating her treatment with maintenance Revlimid fairly well with no other significant adverse effects except for mild chemotherapy-induced neutropenia. She also has mild peripheral neuropathy in the toes of the right foot. She denied having any significant fever or chills. She denied having any significant chest pain, shortness of breath, cough or hemoptysis. She has no nausea or vomiting. She had repeat CBC, complaints metabolic panel and LDH performed earlier today and she is here for evaluation and discussion of her lab results.  MEDICAL HISTORY: Past Medical History  Diagnosis Date  . COLONIC POLYPS, HX OF 04/24/2007    hyperplastic only  . OSTEOPOROSIS 04/24/2007  . GERD 09/21/2010  . DM 03/18/2008  . HYPERCHOLESTEROLEMIA 07/03/2009  . ANEMIA-IRON DEFICIENCY 04/24/2007  . ALLERGIC RHINITIS CAUSE UNSPECIFIED 01/02/2009  . ESOPHAGEAL STRICTURE 04/15/2007    s/p dilitation  . HIATAL HERNIA 04/15/2007  . CERVICAL RADICULOPATHY, LEFT 02/01/2010  . 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 Chest pain 03/2012    a.  04/15/2012 Ex MV: Ex time 10 mins, EF 63%, no ischemia/infarct.  Occas PAC's/PVC's.  . Hypokalemia 11/30/2015    ALLERGIES:  is allergic to atorvastatin; rosuvastatin; rosuvastatin calcium; sulfamethoxazole; and sulfonamide derivatives.  MEDICATIONS:  Current Outpatient Prescriptions  Medication Sig Dispense Refill  . Ascorbic Acid (VITAMIN C) 1000 MG tablet Take 1,000 mg by mouth.    . Calcium 500-125 MG-UNIT TABS Take 1 tablet by mouth daily.      . cholecalciferol (VITAMIN D) 1000 UNITS tablet Take 1,000 Units by mouth daily.    Marland Kitchen lenalidomide (REVLIMID) 10 MG capsule Take 1 capsule (10 mg total) by mouth daily. 28 capsule 0  . potassium chloride SA (K-DUR,KLOR-CON) 20 MEQ tablet Take 1 tablet (20 mEq total) by mouth 2 (two) times daily. (Patient not taking: Reported on 12/28/2015) 20 tablet 0  . prochlorperazine (COMPAZINE) 10 MG tablet Take 1 tablet (10 mg total) by mouth every 6 (six) hours as needed for nausea or vomiting. (Patient not taking: Reported on 12/28/2015) 30 tablet 0  . warfarin (COUMADIN) 2 MG tablet Take 1 tablet (2 mg total) by mouth daily. 30 tablet 2   No current facility-administered medications for this visit.    SURGICAL HISTORY:  Past Surgical History  Procedure Laterality Date  . Tubal ligation    . Esophagogastroduodenoscopy  04/15/2007    REVIEW OF SYSTEMS:  A comprehensive review of systems was negative except for: Neurological: positive for paresthesia   PHYSICAL EXAMINATION: General appearance: alert, cooperative, fatigued and no distress Head: Normocephalic, without obvious abnormality, atraumatic Neck: no adenopathy, no JVD, supple, symmetrical, trachea midline and thyroid not enlarged, symmetric, no  tenderness/mass/nodules Lymph nodes: Cervical, supraclavicular, and axillary nodes normal. Resp: clear to auscultation bilaterally Back: symmetric, no curvature. ROM normal. No CVA tenderness. Cardio: regular rate and rhythm, S1, S2 normal, no murmur,  click, rub or gallop GI: soft, non-tender; bowel sounds normal; no masses,  no organomegaly Extremities: extremities normal, atraumatic, no cyanosis or edema Neurologic: Alert and oriented X 3, normal strength and tone. Normal symmetric reflexes. Normal coordination and gait  ECOG PERFORMANCE STATUS: 1 - Symptomatic but completely ambulatory  Blood pressure 105/67, pulse 47, temperature 98 F (36.7 C), temperature source Oral, resp. rate 18, height _0  (1.676 m), weight 143 lb 12.8 oz (65.227 kg), SpO2 100 %.  LABORATORY DATA: Lab Results  Component Value Date   WBC 3.0* 03/22/2016   HGB 12.1 03/22/2016   HCT 37.3 03/22/2016   MCV 89.8 03/22/2016   PLT 134* 03/22/2016      Chemistry      Component Value Date/Time   NA 142 03/22/2016 1057   NA 140 04/14/2013 0916   K 3.9 03/22/2016 1057   K 3.8 04/14/2013 0916   CL 105 04/14/2013 0916   CO2 25 03/22/2016 1057   CO2 28 04/14/2013 0916   BUN 9.3 03/22/2016 1057   BUN 10 04/14/2013 0916   CREATININE 1.0 03/22/2016 1057   CREATININE 1.0 04/14/2013 0916      Component Value Date/Time   CALCIUM 9.3 03/22/2016 1057   CALCIUM 9.7 04/14/2013 0916   CALCIUM 9.0 12/18/2009 2256   ALKPHOS 61 03/22/2016 1057   ALKPHOS 42 04/14/2013 0916   AST 17 03/22/2016 1057   AST 23 04/14/2013 0916   ALT 21 03/22/2016 1057   ALT 24 04/14/2013 0916   BILITOT 0.70 03/22/2016 1057   BILITOT 0.4 04/14/2013 0916     Myeloma panel on 01/18/2016: Beta-2 microglobulin 1.5, free Kappa chain 47.63, free lambda light chain 38.80,Kappa /lambda ratio 1.23. IgG 1106, IgA 500 and IgM 11  ASSESSMENT AND PLAN: This is a very pleasant 65 years old [place] American female who was recently diagnosed with IgA multiple myeloma with significant elevation of IgA over 7000.  She is currently undergoing systemic chemotherapy with Carfilzomib, Cytoxan and dexamethasone and tolerating it fairly well.  she status post 5 cycles followed by autologous peripheral blood  stem cell transplant in July of 2015. The patient was started on maintenance treatment with Revlimid initially 10 mg by mouth daily for 2 months and currently undergoing maintenance treatment with Revlimid 15 mg by mouth daily status post 4 months. She is currently on Revlimid 10 mg by mouth daily status post 11 months.  Her lab work is unremarkable today except for mild neutropenia. I recommended for her to continue her current treatment with Revlimid.  For the chemotherapy-induced neutropenia, I will arrange for the patient to receive 1 dose of Granix 300 g subcutaneously 1 today. She will come back for follow-up visit in one month for reevaluation with repeat myeloma panel. She was advised to call immediately she has any concerning symptoms in the interval. The patient voices understanding of current disease status and treatment options and is in agreement with the current care plan.  All questions were answered. The patient knows to call the clinic with any problems, questions or concerns. We can certainly see the patient much sooner if necessary.  Disclaimer: This note was dictated with voice recognition software. Similar sounding words can inadvertently be transcribed and may not be corrected upon review.

## 2016-04-01 NOTE — Telephone Encounter (Signed)
Gave and printed appt sched and avs for pt for July  °

## 2016-04-02 ENCOUNTER — Other Ambulatory Visit: Payer: Self-pay | Admitting: Internal Medicine

## 2016-04-04 ENCOUNTER — Other Ambulatory Visit: Payer: Self-pay | Admitting: *Deleted

## 2016-04-04 DIAGNOSIS — C9000 Multiple myeloma not having achieved remission: Secondary | ICD-10-CM

## 2016-04-04 MED ORDER — LENALIDOMIDE 10 MG PO CAPS: 10.0000 mg | ORAL_CAPSULE | Freq: Every day | ORAL | Status: DC

## 2016-04-22 ENCOUNTER — Ambulatory Visit (HOSPITAL_BASED_OUTPATIENT_CLINIC_OR_DEPARTMENT_OTHER): Payer: 59

## 2016-04-22 ENCOUNTER — Other Ambulatory Visit: Payer: Self-pay | Admitting: Internal Medicine

## 2016-04-22 ENCOUNTER — Other Ambulatory Visit: Payer: Self-pay | Admitting: Medical Oncology

## 2016-04-22 ENCOUNTER — Other Ambulatory Visit (HOSPITAL_BASED_OUTPATIENT_CLINIC_OR_DEPARTMENT_OTHER): Payer: 59

## 2016-04-22 ENCOUNTER — Other Ambulatory Visit: Payer: Self-pay | Admitting: *Deleted

## 2016-04-22 ENCOUNTER — Telehealth: Payer: Self-pay | Admitting: Internal Medicine

## 2016-04-22 DIAGNOSIS — D701 Agranulocytosis secondary to cancer chemotherapy: Secondary | ICD-10-CM

## 2016-04-22 DIAGNOSIS — C9000 Multiple myeloma not having achieved remission: Secondary | ICD-10-CM

## 2016-04-22 DIAGNOSIS — D72819 Decreased white blood cell count, unspecified: Secondary | ICD-10-CM

## 2016-04-22 DIAGNOSIS — T451X5A Adverse effect of antineoplastic and immunosuppressive drugs, initial encounter: Secondary | ICD-10-CM

## 2016-04-22 LAB — COMPREHENSIVE METABOLIC PANEL
ALBUMIN: 3.7 g/dL (ref 3.5–5.0)
ALK PHOS: 68 U/L (ref 40–150)
ALT: 17 U/L (ref 0–55)
AST: 16 U/L (ref 5–34)
Anion Gap: 9 mEq/L (ref 3–11)
BILIRUBIN TOTAL: 0.86 mg/dL (ref 0.20–1.20)
BUN: 12.9 mg/dL (ref 7.0–26.0)
CALCIUM: 9.5 mg/dL (ref 8.4–10.4)
CO2: 24 mEq/L (ref 22–29)
CREATININE: 1.1 mg/dL (ref 0.6–1.1)
Chloride: 109 mEq/L (ref 98–109)
EGFR: 62 mL/min/{1.73_m2} — ABNORMAL LOW (ref >90)
GLUCOSE: 88 mg/dL (ref 70–140)
Potassium: 3.5 mEq/L (ref 3.5–5.1)
SODIUM: 141 meq/L (ref 136–145)
TOTAL PROTEIN: 7.2 g/dL (ref 6.4–8.3)

## 2016-04-22 LAB — CBC WITH DIFFERENTIAL/PLATELET
BASO%: 2.2 % — ABNORMAL HIGH (ref 0.0–2.0)
Basophils Absolute: 0.1 10*3/uL (ref 0.0–0.1)
EOS%: 4.9 % (ref 0.0–7.0)
Eosinophils Absolute: 0.1 10*3/uL (ref 0.0–0.5)
HEMATOCRIT: 37.4 % (ref 34.8–46.6)
HEMOGLOBIN: 12.3 g/dL (ref 11.6–15.9)
LYMPH#: 1.4 10*3/uL (ref 0.9–3.3)
LYMPH%: 64 % — ABNORMAL HIGH (ref 14.0–49.7)
MCH: 28.9 pg (ref 25.1–34.0)
MCHC: 32.9 g/dL (ref 31.5–36.0)
MCV: 87.8 fL (ref 79.5–101.0)
MONO#: 0.2 10*3/uL (ref 0.1–0.9)
MONO%: 9.3 % (ref 0.0–14.0)
NEUT%: 19.6 % — ABNORMAL LOW (ref 38.4–76.8)
NEUTROS ABS: 0.4 10*3/uL — AB (ref 1.5–6.5)
Platelets: 145 10*3/uL (ref 145–400)
RBC: 4.26 10*6/uL (ref 3.70–5.45)
RDW: 14.6 % — AB (ref 11.2–14.5)
WBC: 2.3 10*3/uL — AB (ref 3.9–10.3)

## 2016-04-22 LAB — LACTATE DEHYDROGENASE: LDH: 131 U/L (ref 125–245)

## 2016-04-22 MED ORDER — TBO-FILGRASTIM 300 MCG/0.5ML ~~LOC~~ SOSY
300.0000 ug | PREFILLED_SYRINGE | Freq: Once | SUBCUTANEOUS | Status: DC
  Administered 2016-04-22: 300 ug via SUBCUTANEOUS
  Filled 2016-04-22: qty 0.5

## 2016-04-22 MED ORDER — TBO-FILGRASTIM 300 MCG/0.5ML ~~LOC~~ SOSY: 300.0000 ug | PREFILLED_SYRINGE | Freq: Once | SUBCUTANEOUS | Status: DC

## 2016-04-22 NOTE — Telephone Encounter (Signed)
Gave pt cal

## 2016-04-23 ENCOUNTER — Ambulatory Visit (HOSPITAL_BASED_OUTPATIENT_CLINIC_OR_DEPARTMENT_OTHER): Payer: 59

## 2016-04-23 VITALS — BP 126/70 | HR 63 | Temp 98.3°F | Resp 20

## 2016-04-23 DIAGNOSIS — C9000 Multiple myeloma not having achieved remission: Secondary | ICD-10-CM | POA: Diagnosis not present

## 2016-04-23 DIAGNOSIS — T451X5A Adverse effect of antineoplastic and immunosuppressive drugs, initial encounter: Principal | ICD-10-CM

## 2016-04-23 DIAGNOSIS — D701 Agranulocytosis secondary to cancer chemotherapy: Secondary | ICD-10-CM

## 2016-04-23 LAB — BETA 2 MICROGLOBULIN, SERUM: BETA 2: 1.8 mg/L (ref 0.6–2.4)

## 2016-04-23 LAB — IGG, IGA, IGM
IGM (IMMUNOGLOBIN M), SRM: 20 mg/dL — AB (ref 26–217)
IgA, Qn, Serum: 471 mg/dL — ABNORMAL HIGH (ref 87–352)

## 2016-04-23 LAB — KAPPA/LAMBDA LIGHT CHAINS
IG KAPPA FREE LIGHT CHAIN: 37.5 mg/L — AB (ref 3.3–19.4)
Ig Lambda Free Light Chain: 33.4 mg/L — ABNORMAL HIGH (ref 5.7–26.3)
Kappa/Lambda FluidC Ratio: 1.12 (ref 0.26–1.65)

## 2016-04-23 MED ORDER — TBO-FILGRASTIM 300 MCG/0.5ML ~~LOC~~ SOSY
300.0000 ug | PREFILLED_SYRINGE | Freq: Once | SUBCUTANEOUS | Status: AC
  Administered 2016-04-23: 300 ug via SUBCUTANEOUS
  Filled 2016-04-23: qty 0.5

## 2016-04-23 NOTE — Patient Instructions (Signed)

## 2016-04-29 ENCOUNTER — Other Ambulatory Visit: Payer: Self-pay

## 2016-04-29 ENCOUNTER — Ambulatory Visit (HOSPITAL_BASED_OUTPATIENT_CLINIC_OR_DEPARTMENT_OTHER): Payer: 59 | Admitting: Internal Medicine

## 2016-04-29 ENCOUNTER — Encounter: Payer: Self-pay | Admitting: Internal Medicine

## 2016-04-29 ENCOUNTER — Telehealth: Payer: Self-pay | Admitting: Internal Medicine

## 2016-04-29 ENCOUNTER — Telehealth: Payer: Self-pay | Admitting: Cardiology

## 2016-04-29 VITALS — BP 123/79 | HR 50 | Temp 98.1°F | Resp 17 | Ht 66.0 in | Wt 144.6 lb

## 2016-04-29 DIAGNOSIS — Z5111 Encounter for antineoplastic chemotherapy: Secondary | ICD-10-CM

## 2016-04-29 DIAGNOSIS — R001 Bradycardia, unspecified: Secondary | ICD-10-CM

## 2016-04-29 DIAGNOSIS — D701 Agranulocytosis secondary to cancer chemotherapy: Secondary | ICD-10-CM | POA: Diagnosis not present

## 2016-04-29 DIAGNOSIS — C9000 Multiple myeloma not having achieved remission: Secondary | ICD-10-CM | POA: Diagnosis not present

## 2016-04-29 DIAGNOSIS — T451X5A Adverse effect of antineoplastic and immunosuppressive drugs, initial encounter: Secondary | ICD-10-CM

## 2016-04-29 DIAGNOSIS — G629 Polyneuropathy, unspecified: Secondary | ICD-10-CM

## 2016-04-29 HISTORY — DX: Bradycardia, unspecified: R00.1

## 2016-04-29 HISTORY — DX: Encounter for antineoplastic chemotherapy: Z51.11

## 2016-04-29 NOTE — Telephone Encounter (Signed)
gvae pt cal & avs °

## 2016-04-29 NOTE — Progress Notes (Signed)
[place] [phone]   [phone]) [phone]  OFFICE PROGRESS NOTE  [name], MD 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 First dose on 11/15/2013. Status post 5 cycles. 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 3) Maintenance Revlimid 10 mg by mouth daily. Status post 2 months of treatment. 4) Maintenance Revlimid 15 mg by mouth daily. Status post 4 months of treatment.   CURRENT THERAPY: Maintenance Revlimid 10 mg by mouth daily. Status post 12 months.  INTERVAL HISTORY: Victoria Gates 65 y.o. female returns to the clinic today for follow up visit accompanied by her husband. The patient is feeling fine today with no specific complaints. She is tolerating her treatment with maintenance Revlimid fairly well with no other significant adverse effects except for mild chemotherapy-induced neutropenia. She has been off the treatment for the last few weeks because of significant neutropenia. She received 2 doses of Granix last week. She also has mild peripheral neuropathy in the toes of the right foot. She denied having any significant fever or chills. She denied having any significant chest pain, shortness of breath, cough or hemoptysis. She has no nausea or vomiting. She had repeat myeloma panel performed recently and she is here for evaluation and discussion of her lab results.  MEDICAL HISTORY: Past Medical History:  Diagnosis Date  . ALLERGIC RHINITIS CAUSE UNSPECIFIED 01/02/2009  . ANEMIA-IRON DEFICIENCY 04/24/2007  . CERVICAL RADICULOPATHY, LEFT 02/01/2010  . Chest pain 03/2012   a. 04/15/2012 Ex MV: Ex time 10 mins, EF 63%, no ischemia/infarct.  Occas PAC's/PVC's.  . COLONIC POLYPS, HX OF 04/24/2007   hyperplastic only  . DM 03/18/2008  . ESOPHAGEAL  STRICTURE 04/15/2007   s/p dilitation  . GERD 09/21/2010  . 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 HIATAL HERNIA 04/15/2007  . HYPERCHOLESTEROLEMIA 07/03/2009  . Hypokalemia 11/30/2015  . OSTEOPOROSIS 04/24/2007    ALLERGIES:  is allergic to atorvastatin; rosuvastatin; rosuvastatin calcium; sulfamethoxazole; and sulfonamide derivatives.  MEDICATIONS:  Current Outpatient Prescriptions  Medication Sig Dispense Refill  . Ascorbic Acid (VITAMIN C) 1000 MG tablet Take 1,000 mg by mouth.    . Calcium 500-125 MG-UNIT TABS Take 1 tablet by mouth daily.      . cholecalciferol (VITAMIN D) 1000 UNITS tablet Take 1,000 Units by mouth daily.    . lansoprazole (PREVACID) 30 MG capsule Take 30 mg by mouth.    . lenalidomide (REVLIMID) 10 MG capsule Take 1 capsule (10 mg total) by mouth daily. 28 capsule 0  . potassium chloride SA (K-DUR,KLOR-CON) 20 MEQ tablet Take 1 tablet (20 mEq total) by mouth 2 (two) times daily. 20 tablet 0  . warfarin (COUMADIN) 2 MG tablet TAKE 1 TABLET BY MOUTH EVERY DAY 30 tablet 2  . prochlorperazine (COMPAZINE) 10 MG tablet Take 1 tablet (10 mg total) by mouth every 6 (six) hours as needed for nausea or vomiting. (Patient not taking: Reported on 12/28/2015) 30 tablet 0   No current facility-administered medications for this visit.     SURGICAL HISTORY:  Past Surgical History:  Procedure Laterality Date  . ESOPHAGOGASTRODUODENOSCOPY  04/15/2007  . TUBAL LIGATION      REVIEW OF SYSTEMS:  Constitutional: negative Eyes: negative Ears, nose, mouth, throat, and face: negative Respiratory: negative Cardiovascular: negative Gastrointestinal: negative Genitourinary:negative Integument/breast: negative Hematologic/lymphatic: negative Musculoskeletal:negative Neurological:  negative Behavioral/Psych: negative Endocrine: negative Allergic/Immunologic: negative   PHYSICAL EXAMINATION: General appearance: alert, cooperative, fatigued and no distress Head: Normocephalic, without obvious  abnormality, atraumatic Neck: no adenopathy, no JVD, supple, symmetrical, trachea midline and thyroid not enlarged, symmetric, no tenderness/mass/nodules Lymph nodes: Cervical, supraclavicular, and axillary nodes normal. Resp: clear to auscultation bilaterally Back: symmetric, no curvature. ROM normal. No CVA tenderness. Cardio: regular rate and rhythm, S1, S2 normal, no murmur, click, rub or gallop GI: soft, non-tender; bowel sounds normal; no masses,  no organomegaly Extremities: extremities normal, atraumatic, no cyanosis or edema Neurologic: Alert and oriented X 3, normal strength and tone. Normal symmetric reflexes. Normal coordination and gait  ECOG PERFORMANCE STATUS: 1 - Symptomatic but completely ambulatory  Blood pressure 114/65, pulse (!) 47, temperature 98.1 F (36.7 C), temperature source Oral, resp. rate 17, height 5\' 6"  (1.676 m), weight 144 lb 9.6 oz (65.6 kg), SpO2 100 %.  LABORATORY DATA: Lab Results  Component Value Date   WBC 2.3 (L) 04/22/2016   HGB 12.3 04/22/2016   HCT 37.4 04/22/2016   MCV 87.8 04/22/2016   PLT 145 04/22/2016      Chemistry      Component Value Date/Time   NA 141 04/22/2016 0736   K 3.5 04/22/2016 0736   CL 105 04/14/2013 0916   CO2 24 04/22/2016 0736   BUN 12.9 04/22/2016 0736   CREATININE 1.1 04/22/2016 0736      Component Value Date/Time   CALCIUM 9.5 04/22/2016 0736   ALKPHOS 68 04/22/2016 0736   AST 16 04/22/2016 0736   ALT 17 04/22/2016 0736   BILITOT 0.86 04/22/2016 0736     Myeloma panel on 01/18/2016: Beta-2 microglobulin 1.8, free Kappa chain 37.5, free lambda light chain 33.4,Kappa /lambda ratio 1.12. IgG 1112, IgA 471 and IgM 20  ASSESSMENT AND PLAN: This is a very pleasant 65 years old [place] American female who was recently diagnosed with IgA multiple myeloma with significant elevation of IgA over 7000.  She is currently undergoing systemic chemotherapy with Carfilzomib, Cytoxan and dexamethasone and tolerating it  fairly well.  she status post 5 cycles followed by autologous peripheral blood stem cell transplant in July of 2015. The patient was started on maintenance treatment with Revlimid initially 10 mg by mouth daily for 2 months and currently undergoing maintenance treatment with Revlimid 15 mg by mouth daily status post 4 months. She is currently on Revlimid 10 mg by mouth daily status post 12 months.  Her lab work is unremarkable today except for mild neutropenia. She received 2 doses of Granix last week. Myeloma panel showed no concerning evidence for disease progression. I recommended for her to resume her current treatment with Revlimid.  Heart rate was low today. We will repeat it manually. We will do an EKG for further evaluation and referral to cardiology if needed. EKG Showed persistent bradycardia. Spoke to Dr. 10-13-2003 who reviewed the EKG. As the patient is currently asymptomatic, he recommended for her to go home and he will arrange for her a follow up appointment with him in the next few days. She will come back for follow-up visit in one month for reevaluation with repeat myeloma panel. She was advised to call immediately she has any concerning symptoms in the interval. The patient voices understanding of current disease status and treatment options and is in agreement with the current care plan.  All questions were answered. The patient knows to call the clinic with any problems, questions or concerns. We  can certainly see the patient much sooner if necessary.  Disclaimer: This note was dictated with voice recognition software. Similar sounding words can inadvertently be transcribed and may not be corrected upon review.

## 2016-04-29 NOTE — Telephone Encounter (Signed)
EKG faxed for Dr Erick Alley to review.

## 2016-05-01 ENCOUNTER — Ambulatory Visit (INDEPENDENT_AMBULATORY_CARE_PROVIDER_SITE_OTHER): Payer: 59 | Admitting: Internal Medicine

## 2016-05-01 ENCOUNTER — Encounter: Payer: Self-pay | Admitting: Internal Medicine

## 2016-05-01 ENCOUNTER — Encounter (INDEPENDENT_AMBULATORY_CARE_PROVIDER_SITE_OTHER): Payer: Self-pay

## 2016-05-01 VITALS — BP 122/70 | HR 66 | Ht 66.0 in | Wt 143.8 lb

## 2016-05-01 DIAGNOSIS — R001 Bradycardia, unspecified: Secondary | ICD-10-CM | POA: Diagnosis not present

## 2016-05-01 LAB — T4, FREE: FREE T4: 1 ng/dL (ref 0.8–1.8)

## 2016-05-01 LAB — TSH: TSH: 3.35 m[IU]/L

## 2016-05-01 NOTE — Patient Instructions (Addendum)
Medication Instructions:  Your physician recommends that you continue on your current medications as directed. Please refer to the Current Medication list given to you today.   Labwork: Your physician recommends that you return for lab work today: TSH/T4    Testing/Procedures: Your physician has requested that you have an echocardiogram. Echocardiography is a painless test that uses sound waves to create images of your heart. It provides your doctor with information about the size and shape of your heart and how well your heart's chambers and valves are working. This procedure takes approximately one hour. There are no restrictions for this procedure.    Follow-Up: Your physician recommends that you schedule a follow-up appointment as needed   Any Other Special Instructions Will Be Listed Below (If Applicable).     If you need a refill on your cardiac medications before your next appointment, please call your pharmacy.

## 2016-05-01 NOTE — Progress Notes (Signed)
Electrophysiology Office Note   Date:  05/01/2016   ID:  Victoria Gates, DOB Aug 12, 1951, MRN [id number]  PCP:  [name], MD  Cardiologist:  none Primary Electrophysiologist: Thompson Grayer, MD    CC; bradycardia   History of Present Illness: Victoria Gates is a 65 y.o. female who presents today for electrophysiology evaluation.   The patient is referred for evaluation of bradycardia.  She was recently seen by Dr Julien Nordmann and noted to have a slow pulse.  EKG revealed sinus bradycardia.  The patient was unaware.  Denies fatigue, dizziness, presyncope or syncope.  Energy is good.  Exercise tolerance is preserved. On review, she has had sinus bradycardia since at least 2013 by ekgs.  She has previously been evaluated by general cardiology.   Today, she denies symptoms of palpitations, chest pain, shortness of breath, orthopnea, PND, lower extremity edema, claudication, , bleeding, or neurologic sequela. The patient is tolerating medications without difficulties and is otherwise without complaint today.    Past Medical History:  Diagnosis Date  . ALLERGIC RHINITIS CAUSE UNSPECIFIED 01/02/2009  . ANEMIA-IRON DEFICIENCY 04/24/2007  . Bradycardia 04/29/2016  . CERVICAL RADICULOPATHY, LEFT 02/01/2010  . Chest pain 03/2012   a. 04/15/2012 Ex MV: Ex time 10 mins, EF 63%, no ischemia/infarct.  Occas PAC's/PVC's.  . COLONIC POLYPS, HX OF 04/24/2007   hyperplastic only  . DM 03/18/2008  . Encounter for antineoplastic chemotherapy 04/29/2016  . ESOPHAGEAL STRICTURE 04/15/2007   s/p dilitation  . GERD 09/21/2010  . 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 HIATAL HERNIA 04/15/2007  . HYPERCHOLESTEROLEMIA 07/03/2009  . Hypokalemia 11/30/2015  . Multiple myeloma ([place])   . OSTEOPOROSIS 04/24/2007   Past Surgical History:  Procedure Laterality Date  . ESOPHAGOGASTRODUODENOSCOPY  04/15/2007  . TUBAL LIGATION       Current Outpatient Prescriptions  Medication Sig Dispense Refill  . Ascorbic Acid (VITAMIN C) 1000 MG  tablet Take 1,000 mg by mouth.    . Calcium 500-125 MG-UNIT TABS Take 1 tablet by mouth daily.      . cholecalciferol (VITAMIN D) 1000 UNITS tablet Take 1,000 Units by mouth daily.    . lansoprazole (PREVACID) 30 MG capsule Take 30 mg by mouth.    . lenalidomide (REVLIMID) 10 MG capsule Take 1 capsule (10 mg total) by mouth daily. 28 capsule 0  . warfarin (COUMADIN) 2 MG tablet TAKE 1 TABLET BY MOUTH EVERY DAY 30 tablet 2   No current facility-administered medications for this visit.     Allergies:   Atorvastatin; Rosuvastatin; Rosuvastatin calcium; Sulfamethoxazole; and Sulfonamide derivatives   Social History:  The patient  reports that she has never smoked. She has never used smokeless tobacco. She reports that she does not drink alcohol or use drugs.   Family History:  The patient's  family history includes Brain cancer in her mother; Diabetes in her brother and sister; Heart disease in her brother, father, mother, and sister; Lung cancer in her father.    ROS:  Please see the history of present illness.   All other systems are reviewed and negative.    PHYSICAL EXAM: VS:  BP 122/70 (BP Location: Left Arm, Patient Position: Sitting, Cuff Size: Normal)   Pulse 66   Ht '5\' 6"'$  (1.676 m)   Wt 143 lb 12.8 oz (65.2 kg)   SpO2 99%   BMI 23.21 kg/m  , BMI Body mass index is 23.21 kg/m. GEN: Well nourished, well developed, in no acute distress  HEENT: normal  Neck:  no JVD, carotid bruits, or masses Cardiac: bradycardic regular rhythm; no murmurs, rubs, or gallops,no edema  Respiratory:  clear to auscultation bilaterally, normal work of breathing GI: soft, nontender, nondistended, + BS MS: no deformity or atrophy  Skin: warm and dry  Neuro:  Strength and sensation are intact Psych: euthymic mood, full affect  EKG:  EKGs are reviewed in epic and confirm sinus bradycardia, no AV conduction disease (PR 176 msec, QRS 86 msec), QTc 445 msec, early repolarization   Recent  Labs: 04/22/2016: ALT 17; BUN 12.9; Creatinine 1.1; HGB 12.3; Platelets 145; Potassium 3.5; Sodium 141    Lipid Panel     Component Value Date/Time   CHOL 125 04/14/2013 0916   TRIG 203.0 (H) 04/14/2013 0916   HDL 27.40 (L) 04/14/2013 0916   CHOLHDL 5 04/14/2013 0916   VLDL 40.6 (H) 04/14/2013 0916   LDLCALC 92 04/06/2012 0229   LDLDIRECT 70.7 04/14/2013 0916     Wt Readings from Last 3 Encounters:  05/01/16 143 lb 12.8 oz (65.2 kg)  04/29/16 144 lb 9.6 oz (65.6 kg)  04/01/16 143 lb 12.8 oz (65.2 kg)      Other studies Reviewed: Additional studies/ records that were reviewed today include: prior office notes, hospitalizations, ekgs back to 2013, prior echo  Review of the above records today demonstrates:  As above   ASSESSMENT AND PLAN:  1.  Sinus bradycardia (asymptomatic) The patient has asymptomatic sinus bradycardia.  I see no evidence of AV nodal conduction system disease.  Will order TFTs. Also obtain an echo to evaluate for cardiac involvement of her myeloma though she really does not have symptoms. No indication for pacing at this time. If echo is low risk, I will see as needed going forward.   Current medicines are reviewed at length with the patient today.   The patient does not have concerns regarding her medicines.  The following changes were made today:  none    Signed, Thompson Grayer, MD  05/01/2016 9:29 AM     [facility] [address] [place] [address] [phone] (office) [phone] (fax)

## 2016-05-06 ENCOUNTER — Other Ambulatory Visit: Payer: Self-pay | Admitting: Medical Oncology

## 2016-05-06 DIAGNOSIS — C9000 Multiple myeloma not having achieved remission: Secondary | ICD-10-CM

## 2016-05-06 MED ORDER — LENALIDOMIDE 10 MG PO CAPS
10.0000 mg | ORAL_CAPSULE | Freq: Every day | ORAL | 0 refills | Status: DC
Start: 2016-05-06 — End: 2016-06-07

## 2016-05-09 ENCOUNTER — Emergency Department (HOSPITAL_COMMUNITY): Payer: 59

## 2016-05-09 ENCOUNTER — Emergency Department (HOSPITAL_COMMUNITY)
Admission: EM | Admit: 2016-05-09 | Discharge: 2016-05-09 | Disposition: A | Discharge status: Home / Self Care | Payer: 59 | Attending: Emergency Medicine | Admitting: Emergency Medicine

## 2016-05-09 ENCOUNTER — Other Ambulatory Visit: Payer: Self-pay

## 2016-05-09 ENCOUNTER — Encounter (HOSPITAL_COMMUNITY): Payer: Self-pay | Admitting: Emergency Medicine

## 2016-05-09 DIAGNOSIS — R55 Syncope and collapse: Secondary | ICD-10-CM | POA: Insufficient documentation

## 2016-05-09 DIAGNOSIS — Z79899 Other long term (current) drug therapy: Secondary | ICD-10-CM | POA: Diagnosis not present

## 2016-05-09 DIAGNOSIS — Z7901 Long term (current) use of anticoagulants: Secondary | ICD-10-CM | POA: Insufficient documentation

## 2016-05-09 LAB — CBC
HEMATOCRIT: 35.5 % — AB (ref 36.0–46.0)
HEMOGLOBIN: 11.7 g/dL — AB (ref 12.0–15.0)
MCH: 29.1 pg (ref 26.0–34.0)
MCHC: 33 g/dL (ref 30.0–36.0)
MCV: 88.3 fL (ref 78.0–100.0)
Platelets: 122 10*3/uL — ABNORMAL LOW (ref 150–400)
RBC: 4.02 MIL/uL (ref 3.87–5.11)
RDW: 14.6 % (ref 11.5–15.5)
WBC: 2.6 10*3/uL — ABNORMAL LOW (ref 4.0–10.5)

## 2016-05-09 LAB — BASIC METABOLIC PANEL
ANION GAP: 7 (ref 5–15)
BUN: 10 mg/dL (ref 6–20)
CALCIUM: 8.8 mg/dL — AB (ref 8.9–10.3)
CHLORIDE: 104 mmol/L (ref 101–111)
CO2: 26 mmol/L (ref 22–32)
Creatinine, Ser: 1.04 mg/dL — ABNORMAL HIGH (ref 0.44–1.00)
GFR calc non Af Amer: 55 mL/min — ABNORMAL LOW (ref >60)
Glucose, Bld: 98 mg/dL (ref 65–99)
Potassium: 3.6 mmol/L (ref 3.5–5.1)
SODIUM: 137 mmol/L (ref 135–145)

## 2016-05-09 LAB — URINALYSIS, ROUTINE W REFLEX MICROSCOPIC
Bilirubin Urine: NEGATIVE
Glucose, UA: NEGATIVE mg/dL
Hgb urine dipstick: NEGATIVE
KETONES UR: NEGATIVE mg/dL
LEUKOCYTES UA: NEGATIVE
NITRITE: NEGATIVE
PH: 5.5 (ref 5.0–8.0)
Protein, ur: NEGATIVE mg/dL
SPECIFIC GRAVITY, URINE: 1.02 (ref 1.005–1.030)

## 2016-05-09 LAB — CBG MONITORING, ED: GLUCOSE-CAPILLARY: 97 mg/dL (ref 65–99)

## 2016-05-09 LAB — PROTIME-INR
INR: 1.22
PROTHROMBIN TIME: 15.4 s — AB (ref 11.4–15.2)

## 2016-05-09 LAB — I-STAT TROPONIN, ED: Troponin i, poc: 0 ng/mL (ref 0.00–0.08)

## 2016-05-09 MED ORDER — SODIUM CHLORIDE 0.9 % IV BOLUS (SEPSIS)
1000.0000 mL | Freq: Once | INTRAVENOUS | Status: AC
  Administered 2016-05-09: 1000 mL via INTRAVENOUS

## 2016-05-09 NOTE — ED Triage Notes (Signed)
65 yo female presents from work with witnessed LOC. Husband at bedside states she passed out and hit head. Pt currently a/o and answering questions appropriately. HX of multiple myeloma. NAD at triage.

## 2016-05-09 NOTE — ED Notes (Signed)
Pt stated that she has appointment tomorrow to meet with a cardiologist due to bradycardia. Pt stated that this has happened before in December 2015, but nothing was found to have caused it. Pt states that she passed out at work and doesn't remember passing out, then her co-workwers got her in a wheelchair where she passed out again. Pt stated that her co-workers said she hit her head when she fell, but pt is in no pain.

## 2016-05-09 NOTE — ED Provider Notes (Signed)
[facility] DEPT Provider Note   CSN: [id number] Arrival date & time: 05/09/16  2536  First Provider Contact:  First MD Initiated Contact with Patient 05/09/16 1021        History   Chief Complaint Chief Complaint  Patient presents with  . Loss of Consciousness    HPI Victoria Gates is a 65 y.o. female.  The history is provided by the patient. No language interpreter was used.  Loss of Consciousness      Victoria Gates is a 65 y.o. female who presents to the Emergency Department complaining of syncope.  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 She was feeling in her routine state of health and a breakfast this morning. She had been standing for about an hour when she passed out. She had no preceding symptoms. She does not recall the event and it is unclear how long she was unconscious. She also had a second syncopal event while she was seated. Reports she struck her head but she has no headache, chest pain, shortness of breath. She is currently receiving treatment for multiple myeloma and is on Coumadin. She was referred to cardiology and has appointment tomorrow due to bradycardia.  Past Medical History:  Diagnosis Date  . ALLERGIC RHINITIS CAUSE UNSPECIFIED 01/02/2009  . ANEMIA-IRON DEFICIENCY 04/24/2007  . Bradycardia 04/29/2016  . CERVICAL RADICULOPATHY, LEFT 02/01/2010  . Chest pain 03/2012   a. 04/15/2012 Ex MV: Ex time 10 mins, EF 63%, no ischemia/infarct.  Occas PAC's/PVC's.  . COLONIC POLYPS, HX OF 04/24/2007   hyperplastic only  . DM 03/18/2008  . Encounter for antineoplastic chemotherapy 04/29/2016  . ESOPHAGEAL STRICTURE 04/15/2007   s/p dilitation  . GERD 09/21/2010  . 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 HIATAL HERNIA 04/15/2007  . HYPERCHOLESTEROLEMIA 07/03/2009  . Hypokalemia 11/30/2015  . Multiple myeloma ([place])   . OSTEOPOROSIS 04/24/2007    Patient Active Problem List   Diagnosis Date Noted  . Encounter for antineoplastic chemotherapy  04/29/2016  . Bradycardia 04/29/2016  . Multiple myeloma not having achieved remission ([place]) 11/30/2015  . Hypokalemia 11/30/2015  . Neutropenia ([place]) 05/03/2015  . Syncope 09/11/2014  . Bronchitis 09/08/2014  . Multiple myeloma ([place]) 10/22/2013  . 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 Hyperproteinemia 04/14/2013  . Midsternal chest pain 04/20/2012  . Lipoma of shoulder 11/02/2011  . Routine general medical examination at a health care facility 03/26/2011  . 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 GERD 09/21/2010  . CERVICAL RADICULOPATHY, LEFT 02/01/2010  . CORNS AND CALLOSITIES 12/18/2009  . HYPERCHOLESTEROLEMIA 07/03/2009  . OTITIS EXTERNA 07/03/2009  . CERUMEN IMPACTION, RIGHT 07/03/2009  . MYALGIA 07/03/2009  . WRIST INJURY, RIGHT 05/15/2009  . Allergic rhinitis 01/02/2009  . OTHER DYSPHAGIA 09/01/2008  . DM 03/18/2008  . Unspecified Chest Pain 12/24/2007  . URI 12/12/2007  . ANEMIA-IRON DEFICIENCY 04/24/2007  . HYPERTENSION 04/24/2007  . OSTEOPOROSIS 04/24/2007  . COLONIC POLYPS, HX OF 04/24/2007  . ESOPHAGEAL STRICTURE 04/15/2007  . HIATAL HERNIA 04/15/2007    Past Surgical History:  Procedure Laterality Date  . ESOPHAGOGASTRODUODENOSCOPY  04/15/2007  . TUBAL LIGATION      OB History    No data available       Home Medications    Prior to Admission medications   Medication Sig Start Date End Date Taking? Authorizing Provider  Ascorbic Acid (VITAMIN C) 1000 MG tablet Take 1,000 mg by mouth daily.    Yes Historical Provider, MD  Calcium 500-125 MG-UNIT TABS  Take 1 tablet by mouth daily.     Yes Historical Provider, MD  cholecalciferol (VITAMIN D) 1000 UNITS tablet Take 1,000 Units by mouth daily.   Yes Historical Provider, MD  lansoprazole (PREVACID) 30 MG capsule Take 30 mg by mouth daily with breakfast.    Yes Historical Provider, MD  lenalidomide (REVLIMID) 10 MG capsule Take 1 capsule (10 mg total) by mouth daily. 05/06/16  Yes Si Gaul, MD  loratadine  (CLARITIN) 10 MG tablet Take 10 mg by mouth once.   Yes Historical Provider, MD  warfarin (COUMADIN) 2 MG tablet TAKE 1 TABLET BY MOUTH EVERY DAY 04/02/16  Yes Si Gaul, MD    Family History Family History  Problem Relation Age of Onset  . Brain cancer Mother   . Heart disease Mother   . Lung cancer Father   . Heart disease Father   . Diabetes Sister   . Heart disease Sister   . Diabetes Brother   . Heart disease Brother   . Cancer Neg Hx     No FH of Colon Cancer  . Colon cancer Neg Hx   . Esophageal cancer Neg Hx   . Rectal cancer Neg Hx   . Stomach cancer Neg Hx     Social History Social History  Substance Use Topics  . Smoking status: Never Smoker  . Smokeless tobacco: Never Used  . Alcohol use No     Allergies   Atorvastatin; Rosuvastatin; and Sulfonamide derivatives   Review of Systems Review of Systems  Cardiovascular: Positive for syncope.  All other systems reviewed and are negative.    Physical Exam Updated Vital Signs BP 131/67 (BP Location: Left Arm)   Pulse 66   Temp 98.2 F (36.8 C) (Oral)   Resp 15   SpO2 100%   Physical Exam  Constitutional: She is oriented to person, place, and time. She appears well-developed and well-nourished.  HENT:  Head: Normocephalic and atraumatic.  Cardiovascular: Normal rate and regular rhythm.   No murmur heard. Pulmonary/Chest: Effort normal and breath sounds normal. No respiratory distress.  Abdominal: Soft. There is no tenderness. There is no rebound and no guarding.  Musculoskeletal: She exhibits no edema or tenderness.  Neurological: She is alert and oriented to person, place, and time.  Skin: Skin is warm and dry.  Psychiatric: She has a normal mood and affect. Her behavior is normal.  Nursing note and vitals reviewed.    ED Treatments / Results  Labs (all labs ordered are listed, but only abnormal results are displayed) Labs Reviewed  BASIC METABOLIC PANEL - Abnormal; Notable for the  following:       Result Value   Creatinine, Ser 1.04 (*)    Calcium 8.8 (*)    GFR calc non Af Amer 55 (*)    All other components within normal limits  PROTIME-INR - Abnormal; Notable for the following:    Prothrombin Time 15.4 (*)    All other components within normal limits  CBC - Abnormal; Notable for the following:    WBC 2.6 (*)    Hemoglobin 11.7 (*)    HCT 35.5 (*)    Platelets 122 (*)    All other components within normal limits  URINALYSIS, ROUTINE W REFLEX MICROSCOPIC (NOT AT [facility])  CBG MONITORING, ED  Rosezena Sensor, ED    EKG  EKG Interpretation  Date/Time:  Thursday May 09 2016 09:36:18 EDT Ventricular Rate:  84 PR Interval:    QRS Duration: 96 QT  Interval:  394 QTC Calculation: 466 R Axis:   64 Text Interpretation:  Sinus rhythm Borderline T wave abnormalities Confirmed by Hazle Coca [phone]) on 05/09/2016 11:55:18 AM       Radiology Dg Chest 2 View  Result Date: 05/09/2016 CLINICAL DATA:  Syncope, hit head EXAM: CHEST  2 VIEW COMPARISON:  04/05/2012 FINDINGS: Cardiomediastinal silhouette is stable. No acute infiltrate or pleural effusion. No pulmonary edema. Mild lower thoracic dextroscoliosis. Mild degenerative changes mid thoracic spine. IMPRESSION: No active cardiopulmonary disease. Electronically Signed   By: Lahoma Crocker M.D.   On: 05/09/2016 11:03   Ct Head Wo Contrast  Result Date: 05/09/2016 CLINICAL DATA:  Syncope today.  The patient struck her head. EXAM: CT HEAD WITHOUT CONTRAST TECHNIQUE: Contiguous axial images were obtained from the base of the skull through the vertex without intravenous contrast. COMPARISON:  None.  Knee FINDINGS: Brain: No evidence of acute infarction, hemorrhage, extra-axial collection, ventriculomegaly, or mass effect. Slight atrophy of the temporal lobes. Vascular: No hyperdense vessel or unexpected calcification. Skull: Negative for fracture or focal lesion. Sinuses/Orbits: No acute findings. Mucosal thickening in the  paranasal sinuses. Inspissated mucus in the left side of the sphenoid sinus. Other: None. IMPRESSION: No acute abnormality.  Slight atrophy of the temporal lobes. Electronically Signed   By: Lorriane Shire M.D.   On: 05/09/2016 11:32    Procedures Procedures (including critical care time)  Medications Ordered in ED Medications  sodium chloride 0.9 % bolus 1,000 mL (0 mLs Intravenous Stopped 05/09/16 1252)     Initial Impression / Assessment and Plan / ED Course  I have reviewed the triage vital signs and the nursing notes.  Pertinent labs & imaging results that were available during my care of the patient were reviewed by me and considered in my medical decision making (see chart for details).  Clinical Course    Patient here for evaluation of syncopal event 2. She is alert and without complaints in the emergency department. EKG with sinus rhythm, there is significant arrhythmia. Presentation is not consistent with ACS, PE. She is on warfarin due to her multiple myeloma treatment, no hx/o DVT/PE. Plan to DC home with oncology as well as cardiology follow-up. She has cardiology follow-up scheduled for tomorrow already.  Final Clinical Impressions(s) / ED Diagnoses   Final diagnoses:  Syncope and collapse    New Prescriptions Discharge Medication List as of 05/09/2016 12:49 PM       Quintella Reichert, MD 05/09/16 1606

## 2016-05-10 ENCOUNTER — Other Ambulatory Visit: Payer: Self-pay

## 2016-05-10 ENCOUNTER — Ambulatory Visit (HOSPITAL_COMMUNITY): Payer: 59 | Attending: Cardiology

## 2016-05-10 DIAGNOSIS — I351 Nonrheumatic aortic (valve) insufficiency: Secondary | ICD-10-CM | POA: Diagnosis not present

## 2016-05-10 DIAGNOSIS — E119 Type 2 diabetes mellitus without complications: Secondary | ICD-10-CM | POA: Diagnosis not present

## 2016-05-10 DIAGNOSIS — C9000 Multiple myeloma not having achieved remission: Secondary | ICD-10-CM | POA: Insufficient documentation

## 2016-05-10 DIAGNOSIS — E785 Hyperlipidemia, unspecified: Secondary | ICD-10-CM | POA: Diagnosis not present

## 2016-05-10 DIAGNOSIS — R001 Bradycardia, unspecified: Secondary | ICD-10-CM | POA: Diagnosis not present

## 2016-05-20 ENCOUNTER — Ambulatory Visit (INDEPENDENT_AMBULATORY_CARE_PROVIDER_SITE_OTHER): Payer: 59 | Admitting: Endocrinology

## 2016-05-20 ENCOUNTER — Encounter: Payer: Self-pay | Admitting: Endocrinology

## 2016-05-20 VITALS — BP 122/64 | HR 72 | Ht 66.0 in | Wt 140.0 lb

## 2016-05-20 DIAGNOSIS — M810 Age-related osteoporosis without current pathological fracture: Secondary | ICD-10-CM

## 2016-05-20 DIAGNOSIS — Z0000 Encounter for general adult medical examination without abnormal findings: Secondary | ICD-10-CM | POA: Diagnosis not present

## 2016-05-20 DIAGNOSIS — E119 Type 2 diabetes mellitus without complications: Secondary | ICD-10-CM

## 2016-05-20 LAB — LIPID PANEL
CHOL/HDL RATIO: 4
CHOLESTEROL: 117 mg/dL (ref 0–200)
HDL: 28.9 mg/dL — AB (ref >39.00)
LDL Cholesterol: 69 mg/dL (ref 0–99)
NonHDL: 88.43
TRIGLYCERIDES: 98 mg/dL (ref 0.0–149.0)
VLDL: 19.6 mg/dL (ref 0.0–40.0)

## 2016-05-20 LAB — VITAMIN D 25 HYDROXY (VIT D DEFICIENCY, FRACTURES): VITD: 36.76 ng/mL (ref 30.00–100.00)

## 2016-05-20 LAB — HEMOGLOBIN A1C: Hgb A1c MFr Bld: 5.3 % (ref 4.6–6.5)

## 2016-05-20 NOTE — Patient Instructions (Signed)
blood tests are requested for you today.  We'll let you know about the results.  Please come back for a regular physical appointment in 3 months.   

## 2016-05-20 NOTE — Progress Notes (Signed)
Subjective:    Patient ID: Victoria Gates, female    DOB: 03-04-1951, 65 y.o.   MRN: [id number]  HPI  The state of at least three ongoing medical problems is addressed today, with interval history of each noted here: Pt returns for f/u of diabetes mellitus: DM type: 2 Dx'ed: 2409 Complications: none Therapy: none now GDM: never DKA: never Severe hypoglycemia: never Pancreatitis: never Other: she ha never been on insulin Interval history: she has lost 35 lbs since last ov here in 2015, so she no longer needs meds.  Osteoporosis: she denies falls.   Dyslipidemia: she denies chest pain.  Past Medical History:  Diagnosis Date  . ALLERGIC RHINITIS CAUSE UNSPECIFIED 01/02/2009  . ANEMIA-IRON DEFICIENCY 04/24/2007  . Bradycardia 04/29/2016  . CERVICAL RADICULOPATHY, LEFT 02/01/2010  . Chest pain 03/2012   a. 04/15/2012 Ex MV: Ex time 10 mins, EF 63%, no ischemia/infarct.  Occas PAC's/PVC's.  . COLONIC POLYPS, HX OF 04/24/2007   hyperplastic only  . DM 03/18/2008  . Encounter for antineoplastic chemotherapy 04/29/2016  . ESOPHAGEAL STRICTURE 04/15/2007   s/p dilitation  . GERD 09/21/2010  . 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 HIATAL HERNIA 04/15/2007  . HYPERCHOLESTEROLEMIA 07/03/2009  . Hypokalemia 11/30/2015  . Multiple myeloma ([place])   . OSTEOPOROSIS 04/24/2007    Past Surgical History:  Procedure Laterality Date  . ESOPHAGOGASTRODUODENOSCOPY  04/15/2007  . TUBAL LIGATION      Social History   Social History  . Marital status: Married    Spouse name: N/A  . Number of children: N/A  . Years of education: N/A   Occupational History  . 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 Smoking status: Never Smoker  . Smokeless tobacco: Never Used  . Alcohol use No  . Drug use: No  . Sexual activity: Not on file   Other Topics Concern  . Not on file   Social History Narrative   Lives in [place] with spouse.   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     Current Outpatient Prescriptions on File Prior to Visit  Medication Sig Dispense Refill  . Ascorbic Acid (VITAMIN C) 1000 MG tablet Take 1,000 mg by mouth daily.     . Calcium 500-125 MG-UNIT TABS Take 1 tablet by mouth daily.      . cholecalciferol (VITAMIN D) 1000 UNITS tablet Take 1,000 Units by mouth daily.    . lansoprazole (PREVACID) 30 MG capsule Take 30 mg by mouth daily with breakfast.     . lenalidomide (REVLIMID) 10 MG capsule Take 1 capsule (10 mg total) by mouth daily. 28 capsule 0  . loratadine (CLARITIN) 10 MG tablet Take 10 mg by mouth once.    . warfarin (COUMADIN) 2 MG tablet TAKE 1 TABLET BY MOUTH EVERY DAY 30 tablet 2   No current facility-administered medications on file prior to visit.     Allergies  Allergen Reactions  . Atorvastatin     REACTION: myalgias  . Rosuvastatin     REACTION: myalgias  . Sulfonamide Derivatives     Unknown reaction     Family History  Problem Relation Age of Onset  . Brain cancer Mother   . Heart disease Mother   . Lung cancer Father   . Heart disease Father   . Diabetes Sister   . Heart disease Sister   . Diabetes Brother   . Heart disease Brother   . Cancer Neg Hx  No FH of Colon Cancer  . Colon cancer Neg Hx   . Esophageal cancer Neg Hx   . Rectal cancer Neg Hx   . Stomach cancer Neg Hx     BP 122/64   Pulse 72   Ht '5\' 6"'$  (1.676 m)   Wt 140 lb (63.5 kg)   BMI 22.60 kg/m   Review of Systems Denies sob and n/v.      Objective:   Physical Exam VITAL SIGNS:  See vs page GENERAL: no distress LUNGS:  Clear to auscultation.  HEART:  Regular rate and rhythm without murmurs noted. Normal S1,S2.   Gait: normal and steady.     Lab Results  Component Value Date   TSH 3.35 05/01/2016   Lab Results  Component Value Date   CHOL 117 05/20/2016   HDL 28.90 (L) 05/20/2016   LDLCALC 69 05/20/2016   LDLDIRECT 70.7 04/14/2013   TRIG 98.0 05/20/2016   CHOLHDL 4 05/20/2016      Assessment & Plan:  DM: no  medication is needed now Dyslipidemia: no rx is needed now HTN: no rx is needed now

## 2016-05-21 LAB — PTH, INTACT AND CALCIUM
Calcium: 8.7 mg/dL (ref 8.4–10.5)
PTH: 38 pg/mL (ref 14–64)

## 2016-05-27 ENCOUNTER — Other Ambulatory Visit: Payer: Self-pay | Admitting: Medical Oncology

## 2016-05-27 DIAGNOSIS — C9000 Multiple myeloma not having achieved remission: Secondary | ICD-10-CM

## 2016-05-28 ENCOUNTER — Other Ambulatory Visit (HOSPITAL_BASED_OUTPATIENT_CLINIC_OR_DEPARTMENT_OTHER): Payer: 59

## 2016-05-28 ENCOUNTER — Encounter: Payer: Self-pay | Admitting: Internal Medicine

## 2016-05-28 ENCOUNTER — Ambulatory Visit (HOSPITAL_BASED_OUTPATIENT_CLINIC_OR_DEPARTMENT_OTHER): Payer: 59 | Admitting: Internal Medicine

## 2016-05-28 ENCOUNTER — Encounter: Payer: Self-pay | Admitting: Medical Oncology

## 2016-05-28 ENCOUNTER — Telehealth: Payer: Self-pay | Admitting: Internal Medicine

## 2016-05-28 VITALS — BP 93/59 | HR 99 | Temp 98.5°F | Resp 18 | Ht 66.0 in | Wt 145.4 lb

## 2016-05-28 DIAGNOSIS — C9000 Multiple myeloma not having achieved remission: Secondary | ICD-10-CM

## 2016-05-28 DIAGNOSIS — Z5111 Encounter for antineoplastic chemotherapy: Secondary | ICD-10-CM

## 2016-05-28 LAB — CBC WITH DIFFERENTIAL/PLATELET
BASO%: 1.1 % (ref 0.0–2.0)
Basophils Absolute: 0 10*3/uL (ref 0.0–0.1)
EOS%: 3.8 % (ref 0.0–7.0)
Eosinophils Absolute: 0.1 10*3/uL (ref 0.0–0.5)
HCT: 33.3 % — ABNORMAL LOW (ref 34.8–46.6)
HGB: 11 g/dL — ABNORMAL LOW (ref 11.6–15.9)
LYMPH%: 52.1 % — AB (ref 14.0–49.7)
MCH: 29.3 pg (ref 25.1–34.0)
MCHC: 33 g/dL (ref 31.5–36.0)
MCV: 88.9 fL (ref 79.5–101.0)
MONO#: 0.4 10*3/uL (ref 0.1–0.9)
MONO%: 13.5 % (ref 0.0–14.0)
NEUT#: 0.8 10*3/uL — ABNORMAL LOW (ref 1.5–6.5)
NEUT%: 29.5 % — AB (ref 38.4–76.8)
Platelets: 231 10*3/uL (ref 145–400)
RBC: 3.75 10*6/uL (ref 3.70–5.45)
RDW: 14 % (ref 11.2–14.5)
WBC: 2.9 10*3/uL — AB (ref 3.9–10.3)
lymph#: 1.5 10*3/uL (ref 0.9–3.3)

## 2016-05-28 LAB — COMPREHENSIVE METABOLIC PANEL
ALT: 34 U/L (ref 0–55)
AST: 24 U/L (ref 5–34)
Albumin: 2.8 g/dL — ABNORMAL LOW (ref 3.5–5.0)
Alkaline Phosphatase: 118 U/L (ref 40–150)
Anion Gap: 8 mEq/L (ref 3–11)
BUN: 9.8 mg/dL (ref 7.0–26.0)
CHLORIDE: 109 meq/L (ref 98–109)
CO2: 24 meq/L (ref 22–29)
CREATININE: 0.9 mg/dL (ref 0.6–1.1)
Calcium: 9.1 mg/dL (ref 8.4–10.4)
EGFR: 77 mL/min/{1.73_m2} — ABNORMAL LOW (ref >90)
GLUCOSE: 86 mg/dL (ref 70–140)
Potassium: 3.4 mEq/L — ABNORMAL LOW (ref 3.5–5.1)
SODIUM: 142 meq/L (ref 136–145)
Total Bilirubin: 0.37 mg/dL (ref 0.20–1.20)
Total Protein: 6.5 g/dL (ref 6.4–8.3)

## 2016-05-28 NOTE — Progress Notes (Signed)
[facility] Telephone:([phone]) [phone]   Fax:(336) [phone]  OFFICE PROGRESS NOTE  [name], MD 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 First dose on 11/15/2013. Status post 5 cycles. 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 3) Maintenance Revlimid 10 mg by mouth daily. Status post 2 months of treatment. 4) Maintenance Revlimid 15 mg by mouth daily. Status post 4 months of treatment.   CURRENT THERAPY: Maintenance Revlimid 10 mg by mouth daily. Status post 12 months.  INTERVAL HISTORY: Victoria Gates 65 y.o. female returns to the clinic today for follow up visit accompanied by her husband. The patient is feeling fine today with no specific complaints. She is tolerating her treatment with maintenance Revlimid fairly well with no other significant adverse effects except for mild chemotherapy-induced neutropenia. She denied having any significant fever or chills. She denied having any significant chest pain, shortness of breath, cough or hemoptysis. She has no nausea or vomiting. She had cardiac evaluation by Dr. Rayann Heman for bradycardia and echo was low risk. She is currently on observation. She had repeat CBC earlier today and she is here for evaluation and discussion of her lab results.  MEDICAL HISTORY: Past Medical History:  Diagnosis Date  . ALLERGIC RHINITIS CAUSE UNSPECIFIED 01/02/2009  . ANEMIA-IRON DEFICIENCY 04/24/2007  . Bradycardia 04/29/2016  . CERVICAL RADICULOPATHY, LEFT 02/01/2010  . Chest pain 03/2012   a. 04/15/2012 Ex MV: Ex time 10 mins, EF 63%, no ischemia/infarct.  Occas PAC's/PVC's.  . COLONIC POLYPS, HX OF 04/24/2007   hyperplastic only  . DM 03/18/2008  . Encounter for antineoplastic chemotherapy 04/29/2016  . ESOPHAGEAL STRICTURE 04/15/2007   s/p dilitation  . GERD 09/21/2010  . 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 HIATAL HERNIA 04/15/2007  . HYPERCHOLESTEROLEMIA 07/03/2009  . Hypokalemia 11/30/2015  . Multiple myeloma ([place])   . OSTEOPOROSIS 04/24/2007    ALLERGIES:  is allergic to atorvastatin; rosuvastatin; and sulfonamide derivatives.  MEDICATIONS:  Current Outpatient Prescriptions  Medication Sig Dispense Refill  . Ascorbic Acid (VITAMIN C) 1000 MG tablet Take 1,000 mg by mouth daily.     . Calcium 500-125 MG-UNIT TABS Take 1 tablet by mouth daily.      . cholecalciferol (VITAMIN D) 1000 UNITS tablet Take 1,000 Units by mouth daily.    . lansoprazole (PREVACID) 30 MG capsule Take 30 mg by mouth daily with breakfast.     . lenalidomide (REVLIMID) 10 MG capsule Take 1 capsule (10 mg total) by mouth daily. 28 capsule 0  . loratadine (CLARITIN) 10 MG tablet Take 10 mg by mouth once.    . warfarin (COUMADIN) 2 MG tablet TAKE 1 TABLET BY MOUTH EVERY DAY 30 tablet 2   No current facility-administered medications for this visit.     SURGICAL HISTORY:  Past Surgical History:  Procedure Laterality Date  . ESOPHAGOGASTRODUODENOSCOPY  04/15/2007  . TUBAL LIGATION      REVIEW OF SYSTEMS:  Constitutional: negative Eyes: negative Ears, nose, mouth, throat, and face: negative Respiratory: negative Cardiovascular: negative Gastrointestinal: negative Genitourinary:negative Integument/breast: negative Hematologic/lymphatic: negative Musculoskeletal:negative Neurological: negative Behavioral/Psych: negative Endocrine: negative Allergic/Immunologic: negative   PHYSICAL EXAMINATION: General appearance: alert, cooperative, fatigued and no distress Head: Normocephalic, without obvious abnormality, atraumatic Neck: no adenopathy, no JVD, supple, symmetrical, trachea midline and thyroid not enlarged, symmetric, no tenderness/mass/nodules Lymph  nodes: Cervical, supraclavicular, and axillary nodes normal. Resp: clear to auscultation  bilaterally Back: symmetric, no curvature. ROM normal. No CVA tenderness. Cardio: regular rate and rhythm, S1, S2 normal, no murmur, click, rub or gallop GI: soft, non-tender; bowel sounds normal; no masses,  no organomegaly Extremities: extremities normal, atraumatic, no cyanosis or edema Neurologic: Alert and oriented X 3, normal strength and tone. Normal symmetric reflexes. Normal coordination and gait  ECOG PERFORMANCE STATUS: 1 - Symptomatic but completely ambulatory  Blood pressure (!) 93/59, pulse 99, temperature 98.5 F (36.9 C), temperature source Oral, resp. rate 18, height _0  (1.676 m), weight 145 lb 6.4 oz (66 kg), SpO2 99 %.  LABORATORY DATA: Lab Results  Component Value Date   WBC 2.9 (L) 05/28/2016   HGB 11.0 (L) 05/28/2016   HCT 33.3 (L) 05/28/2016   MCV 88.9 05/28/2016   PLT 231 05/28/2016      Chemistry      Component Value Date/Time   NA 137 05/09/2016 1000   NA 141 04/22/2016 0736   K 3.6 05/09/2016 1000   K 3.5 04/22/2016 0736   CL 104 05/09/2016 1000   CO2 26 05/09/2016 1000   CO2 24 04/22/2016 0736   BUN 10 05/09/2016 1000   BUN 12.9 04/22/2016 0736   CREATININE 1.04 (H) 05/09/2016 1000   CREATININE 1.1 04/22/2016 0736      Component Value Date/Time   CALCIUM 8.7 05/20/2016 1427   CALCIUM 9.5 04/22/2016 0736   ALKPHOS 68 04/22/2016 0736   AST 16 04/22/2016 0736   ALT 17 04/22/2016 0736   BILITOT 0.86 04/22/2016 0736     Myeloma panel on 01/18/2016: Beta-2 microglobulin 1.8, free Kappa chain 37.5, free lambda light chain 33.4,Kappa /lambda ratio 1.12. IgG 1112, IgA 471 and IgM 20  ASSESSMENT AND PLAN: This is a very pleasant 65 years old [place] American female who was recently diagnosed with IgA multiple myeloma with significant elevation of IgA over 7000.  She is currently undergoing systemic chemotherapy with Carfilzomib, Cytoxan and dexamethasone and tolerating it fairly well.  she status post 5 cycles followed by autologous peripheral  blood stem cell transplant in July of 2015. The patient was started on maintenance treatment with Revlimid initially 10 mg by mouth daily for 2 months and currently undergoing maintenance treatment with Revlimid 15 mg by mouth daily status post 4 months. She is currently on Revlimid 10 mg by mouth daily status post 13 months.  Her lab work is unremarkable today except for mild neutropenia. I recommended for the patient to take a week off treatment with Revlimid and resume it again. I recommended for her to resume her current treatment with Revlimid.  She will come back for follow-up visit in one month for reevaluation with repeat CBC, comprehensive metabolic panel and LDH. She was advised to call immediately she has any concerning symptoms in the interval. The patient voices understanding of current disease status and treatment options and is in agreement with the current care plan.  All questions were answered. The patient knows to call the clinic with any problems, questions or concerns. We can certainly see the patient much sooner if necessary.  Disclaimer: This note was dictated with voice recognition software. Similar sounding words can inadvertently be transcribed and may not be corrected upon review.

## 2016-05-28 NOTE — Telephone Encounter (Signed)
GAVE PATIENT AVS REPORT AND APPOINTMENT FOR September.

## 2016-06-06 ENCOUNTER — Ambulatory Visit (INDEPENDENT_AMBULATORY_CARE_PROVIDER_SITE_OTHER)
Admission: RE | Admit: 2016-06-06 | Discharge: 2016-06-06 | Disposition: A | Discharge status: Home / Self Care | Payer: 59 | Source: Ambulatory Visit | Attending: Endocrinology | Admitting: Endocrinology

## 2016-06-06 DIAGNOSIS — M810 Age-related osteoporosis without current pathological fracture: Secondary | ICD-10-CM

## 2016-06-07 ENCOUNTER — Other Ambulatory Visit: Payer: Self-pay | Admitting: Medical Oncology

## 2016-06-07 DIAGNOSIS — C9000 Multiple myeloma not having achieved remission: Secondary | ICD-10-CM

## 2016-06-07 MED ORDER — LENALIDOMIDE 10 MG PO CAPS: 10.0000 mg | ORAL_CAPSULE | Freq: Every day | ORAL | 0 refills | Status: DC

## 2016-06-26 ENCOUNTER — Other Ambulatory Visit (HOSPITAL_BASED_OUTPATIENT_CLINIC_OR_DEPARTMENT_OTHER): Payer: 59

## 2016-06-26 ENCOUNTER — Telehealth: Payer: Self-pay | Admitting: Internal Medicine

## 2016-06-26 ENCOUNTER — Ambulatory Visit (HOSPITAL_BASED_OUTPATIENT_CLINIC_OR_DEPARTMENT_OTHER): Payer: 59 | Admitting: Internal Medicine

## 2016-06-26 ENCOUNTER — Encounter: Payer: Self-pay | Admitting: Internal Medicine

## 2016-06-26 VITALS — BP 126/73 | HR 52 | Temp 98.6°F | Resp 18 | Ht 66.0 in | Wt 145.2 lb

## 2016-06-26 DIAGNOSIS — C9000 Multiple myeloma not having achieved remission: Secondary | ICD-10-CM | POA: Diagnosis not present

## 2016-06-26 DIAGNOSIS — Z5111 Encounter for antineoplastic chemotherapy: Secondary | ICD-10-CM

## 2016-06-26 LAB — COMPREHENSIVE METABOLIC PANEL
ALT: 14 U/L (ref 0–55)
AST: 15 U/L (ref 5–34)
Albumin: 3.5 g/dL (ref 3.5–5.0)
Alkaline Phosphatase: 70 U/L (ref 40–150)
Anion Gap: 8 mEq/L (ref 3–11)
BUN: 7.9 mg/dL (ref 7.0–26.0)
CHLORIDE: 108 meq/L (ref 98–109)
CO2: 24 meq/L (ref 22–29)
CREATININE: 0.9 mg/dL (ref 0.6–1.1)
Calcium: 9.1 mg/dL (ref 8.4–10.4)
EGFR: 78 mL/min/{1.73_m2} — ABNORMAL LOW (ref >90)
Glucose: 89 mg/dl (ref 70–140)
POTASSIUM: 3.4 meq/L — AB (ref 3.5–5.1)
Sodium: 140 mEq/L (ref 136–145)
Total Bilirubin: 0.74 mg/dL (ref 0.20–1.20)
Total Protein: 7 g/dL (ref 6.4–8.3)

## 2016-06-26 LAB — CBC WITH DIFFERENTIAL/PLATELET
BASO%: 1 % (ref 0.0–2.0)
BASOS ABS: 0 10*3/uL (ref 0.0–0.1)
EOS%: 2.5 % (ref 0.0–7.0)
Eosinophils Absolute: 0.1 10*3/uL (ref 0.0–0.5)
HEMATOCRIT: 34.4 % — AB (ref 34.8–46.6)
HGB: 11.2 g/dL — ABNORMAL LOW (ref 11.6–15.9)
LYMPH#: 1.5 10*3/uL (ref 0.9–3.3)
LYMPH%: 51.2 % — AB (ref 14.0–49.7)
MCH: 28.9 pg (ref 25.1–34.0)
MCHC: 32.5 g/dL (ref 31.5–36.0)
MCV: 89 fL (ref 79.5–101.0)
MONO#: 0.3 10*3/uL (ref 0.1–0.9)
MONO%: 10.5 % (ref 0.0–14.0)
NEUT#: 1 10*3/uL — ABNORMAL LOW (ref 1.5–6.5)
NEUT%: 34.8 % — AB (ref 38.4–76.8)
Platelets: 121 10*3/uL — ABNORMAL LOW (ref 145–400)
RBC: 3.86 10*6/uL (ref 3.70–5.45)
RDW: 15.4 % — ABNORMAL HIGH (ref 11.2–14.5)
WBC: 2.9 10*3/uL — ABNORMAL LOW (ref 3.9–10.3)

## 2016-06-26 LAB — LACTATE DEHYDROGENASE: LDH: 143 U/L (ref 125–245)

## 2016-06-26 NOTE — Telephone Encounter (Signed)
GAVE PATIENT AVS REPORT AND APPOINTMENTS FOR October  °

## 2016-06-26 NOTE — Progress Notes (Signed)
[place] [phone]   [phone]  OFFICE PROGRESS NOTE  [name], MD 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 First dose on 11/15/2013. Status post 5 cycles. 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 3) Maintenance Revlimid 10 mg by mouth daily. Status post 2 months of treatment. 4) Maintenance Revlimid 15 mg by mouth daily. Status post 4 months of treatment.   CURRENT THERAPY: Maintenance Revlimid 10 mg by mouth daily. Status post 14 months.  INTERVAL HISTORY: Victoria Gates 65 y.o. female returns to the clinic today for follow up visit. The patient is feeling fine today with no specific complaints. She continues to tolerate her treatment with maintenance Revlimid fairly well with no other significant adverse effects except for mild chemotherapy-induced neutropenia. She denied having any significant fever or chills. She denied having any significant chest pain, shortness of breath, cough or hemoptysis. She has no nausea or vomiting. She had repeat CBC and complaints of metallic panel performed earlier today and she is here for evaluation and discussion of her lab results.  MEDICAL HISTORY: Past Medical History:  Diagnosis Date  . ALLERGIC RHINITIS CAUSE UNSPECIFIED 01/02/2009  . ANEMIA-IRON DEFICIENCY 04/24/2007  . Bradycardia 04/29/2016  . CERVICAL RADICULOPATHY, LEFT 02/01/2010  . Chest pain 03/2012   a. 04/15/2012 Ex MV: Ex time 10 mins, EF 63%, no ischemia/infarct.  Occas PAC's/PVC's.  . COLONIC POLYPS, HX OF 04/24/2007   hyperplastic only  . DM 03/18/2008  . Encounter for antineoplastic chemotherapy 04/29/2016  . ESOPHAGEAL STRICTURE 04/15/2007   s/p dilitation  . GERD 09/21/2010  . 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 HIATAL HERNIA 04/15/2007   . HYPERCHOLESTEROLEMIA 07/03/2009  . Hypokalemia 11/30/2015  . Multiple myeloma ([place])   . OSTEOPOROSIS 04/24/2007    ALLERGIES:  is allergic to atorvastatin; rosuvastatin; and sulfonamide derivatives.  MEDICATIONS:  Current Outpatient Prescriptions  Medication Sig Dispense Refill  . Ascorbic Acid (VITAMIN C) 1000 MG tablet Take 1,000 mg by mouth daily.     . Calcium 500-125 MG-UNIT TABS Take 1 tablet by mouth daily.      . cholecalciferol (VITAMIN D) 1000 UNITS tablet Take 1,000 Units by mouth daily.    . lansoprazole (PREVACID) 30 MG capsule Take 30 mg by mouth daily with breakfast.     . lenalidomide (REVLIMID) 10 MG capsule Take 1 capsule (10 mg total) by mouth daily. 28 capsule 0  . loratadine (CLARITIN) 10 MG tablet Take 10 mg by mouth once.    . warfarin (COUMADIN) 2 MG tablet TAKE 1 TABLET BY MOUTH EVERY DAY 30 tablet 2   No current facility-administered medications for this visit.     SURGICAL HISTORY:  Past Surgical History:  Procedure Laterality Date  . ESOPHAGOGASTRODUODENOSCOPY  04/15/2007  . TUBAL LIGATION      REVIEW OF SYSTEMS:  A comprehensive review of systems was negative.   PHYSICAL EXAMINATION: General appearance: alert, cooperative, fatigued and no distress Head: Normocephalic, without obvious abnormality, atraumatic Neck: no adenopathy, no JVD, supple, symmetrical, trachea midline and thyroid not enlarged, symmetric, no tenderness/mass/nodules Lymph nodes: Cervical, supraclavicular, and axillary nodes normal. Resp: clear to auscultation bilaterally Back: symmetric, no curvature. ROM normal. No CVA tenderness. Cardio: regular rate and rhythm, S1, S2 normal, no murmur, click, rub or gallop GI: soft, non-tender; bowel  sounds normal; no masses,  no organomegaly Extremities: extremities normal, atraumatic, no cyanosis or edema Neurologic: Alert and oriented X 3, normal strength and tone. Normal symmetric reflexes. Normal coordination and gait  ECOG PERFORMANCE  STATUS: 1 - Symptomatic but completely ambulatory  Blood pressure 126/73, pulse (!) 52, temperature 98.6 F (37 C), temperature source Oral, resp. rate 18, height '5\' 6"'$  (1.676 m), weight 145 lb 3.2 oz (65.9 kg), SpO2 100 %.  LABORATORY DATA: Lab Results  Component Value Date   WBC 2.9 (L) 06/26/2016   HGB 11.2 (L) 06/26/2016   HCT 34.4 (L) 06/26/2016   MCV 89.0 06/26/2016   PLT 121 (L) 06/26/2016      Chemistry      Component Value Date/Time   NA 140 06/26/2016 1438   K 3.4 (L) 06/26/2016 1438   CL 104 05/09/2016 1000   CO2 24 06/26/2016 1438   BUN 7.9 06/26/2016 1438   CREATININE 0.9 06/26/2016 1438      Component Value Date/Time   CALCIUM 9.1 06/26/2016 1438   ALKPHOS 70 06/26/2016 1438   AST 15 06/26/2016 1438   ALT 14 06/26/2016 1438   BILITOT 0.74 06/26/2016 1438     Myeloma panel on 01/18/2016: Beta-2 microglobulin 1.8, free Kappa chain 37.5, free lambda light chain 33.4,Kappa /lambda ratio 1.12. IgG 1112, IgA 471 and IgM 20  ASSESSMENT AND PLAN: This is a very pleasant 65 years old [place] American female who was recently diagnosed with IgA multiple myeloma with significant elevation of IgA over 7000.  She is currently undergoing systemic chemotherapy with Carfilzomib, Cytoxan and dexamethasone and tolerating it fairly well.  she status post 5 cycles followed by autologous peripheral blood stem cell transplant in July of 2015. The patient was started on maintenance treatment with Revlimid initially 10 mg by mouth daily for 2 months and currently undergoing maintenance treatment with Revlimid 15 mg by mouth daily status post 4 months. She is currently on Revlimid 10 mg by mouth daily status post 14 months.  Her lab work is unremarkable today except for the persistent mild neutropenia. I recommended for her to continue her treatment with Revlimid. She will come back for follow-up visit in one month for reevaluation with repeat myeloma panel. She was advised to call  immediately she has any concerning symptoms in the interval. The patient voices understanding of current disease status and treatment options and is in agreement with the current care plan.  All questions were answered. The patient knows to call the clinic with any problems, questions or concerns. We can certainly see the patient much sooner if necessary.  Disclaimer: This note was dictated with voice recognition software. Similar sounding words can inadvertently be transcribed and may not be corrected upon review.

## 2016-06-30 ENCOUNTER — Other Ambulatory Visit: Payer: Self-pay | Admitting: Internal Medicine

## 2016-07-16 ENCOUNTER — Other Ambulatory Visit: Payer: Self-pay | Admitting: *Deleted

## 2016-07-16 DIAGNOSIS — C9000 Multiple myeloma not having achieved remission: Secondary | ICD-10-CM

## 2016-07-16 MED ORDER — LENALIDOMIDE 10 MG PO CAPS: 10.0000 mg | ORAL_CAPSULE | Freq: Every day | ORAL | 0 refills | Status: DC

## 2016-07-18 ENCOUNTER — Ambulatory Visit (HOSPITAL_BASED_OUTPATIENT_CLINIC_OR_DEPARTMENT_OTHER): Payer: 59

## 2016-07-18 DIAGNOSIS — C9000 Multiple myeloma not having achieved remission: Secondary | ICD-10-CM | POA: Diagnosis not present

## 2016-07-18 LAB — COMPREHENSIVE METABOLIC PANEL
ALT: 16 U/L (ref 0–55)
ANION GAP: 7 meq/L (ref 3–11)
AST: 19 U/L (ref 5–34)
Albumin: 3.7 g/dL (ref 3.5–5.0)
Alkaline Phosphatase: 72 U/L (ref 40–150)
BUN: 8.7 mg/dL (ref 7.0–26.0)
CALCIUM: 9.4 mg/dL (ref 8.4–10.4)
CHLORIDE: 109 meq/L (ref 98–109)
CO2: 26 meq/L (ref 22–29)
Creatinine: 1 mg/dL (ref 0.6–1.1)
EGFR: 67 mL/min/{1.73_m2} — ABNORMAL LOW (ref >90)
Glucose: 88 mg/dl (ref 70–140)
POTASSIUM: 3.8 meq/L (ref 3.5–5.1)
Sodium: 141 mEq/L (ref 136–145)
Total Bilirubin: 1.11 mg/dL (ref 0.20–1.20)
Total Protein: 7.4 g/dL (ref 6.4–8.3)

## 2016-07-18 LAB — CBC WITH DIFFERENTIAL/PLATELET
BASO%: 1.6 % (ref 0.0–2.0)
Basophils Absolute: 0 10*3/uL (ref 0.0–0.1)
EOS%: 3.3 % (ref 0.0–7.0)
Eosinophils Absolute: 0.1 10*3/uL (ref 0.0–0.5)
HEMATOCRIT: 36.1 % (ref 34.8–46.6)
HGB: 12.2 g/dL (ref 11.6–15.9)
LYMPH%: 58.9 % — AB (ref 14.0–49.7)
MCH: 29.5 pg (ref 25.1–34.0)
MCHC: 33.8 g/dL (ref 31.5–36.0)
MCV: 87.4 fL (ref 79.5–101.0)
MONO#: 0.3 10*3/uL (ref 0.1–0.9)
MONO%: 11 % (ref 0.0–14.0)
NEUT#: 0.6 10*3/uL — ABNORMAL LOW (ref 1.5–6.5)
NEUT%: 25.2 % — AB (ref 38.4–76.8)
PLATELETS: 131 10*3/uL — AB (ref 145–400)
RBC: 4.13 10*6/uL (ref 3.70–5.45)
RDW: 14.9 % — ABNORMAL HIGH (ref 11.2–14.5)
WBC: 2.5 10*3/uL — AB (ref 3.9–10.3)
lymph#: 1.5 10*3/uL (ref 0.9–3.3)
nRBC: 0 % (ref 0–0)

## 2016-07-18 LAB — LACTATE DEHYDROGENASE: LDH: 133 U/L (ref 125–245)

## 2016-07-19 LAB — IGG, IGA, IGM
IgA, Qn, Serum: 562 mg/dL — ABNORMAL HIGH (ref 87–352)
IgM, Qn, Serum: 14 mg/dL — ABNORMAL LOW (ref 26–217)

## 2016-07-19 LAB — BETA 2 MICROGLOBULIN, SERUM: BETA 2: 1.6 mg/L (ref 0.6–2.4)

## 2016-07-19 LAB — KAPPA/LAMBDA LIGHT CHAINS
Ig Kappa Free Light Chain: 40.2 mg/L — ABNORMAL HIGH (ref 3.3–19.4)
Ig Lambda Free Light Chain: 36.2 mg/L — ABNORMAL HIGH (ref 5.7–26.3)
Kappa/Lambda FluidC Ratio: 1.11 (ref 0.26–1.65)

## 2016-07-29 ENCOUNTER — Ambulatory Visit (HOSPITAL_BASED_OUTPATIENT_CLINIC_OR_DEPARTMENT_OTHER): Payer: 59

## 2016-07-29 ENCOUNTER — Telehealth: Payer: Self-pay | Admitting: Internal Medicine

## 2016-07-29 ENCOUNTER — Encounter: Payer: Self-pay | Admitting: Internal Medicine

## 2016-07-29 ENCOUNTER — Ambulatory Visit (HOSPITAL_BASED_OUTPATIENT_CLINIC_OR_DEPARTMENT_OTHER): Payer: 59 | Admitting: Internal Medicine

## 2016-07-29 VITALS — BP 105/65 | HR 55 | Temp 98.2°F | Resp 18 | Ht 66.0 in | Wt 142.7 lb

## 2016-07-29 DIAGNOSIS — D701 Agranulocytosis secondary to cancer chemotherapy: Secondary | ICD-10-CM | POA: Diagnosis not present

## 2016-07-29 DIAGNOSIS — C9000 Multiple myeloma not having achieved remission: Secondary | ICD-10-CM | POA: Diagnosis not present

## 2016-07-29 DIAGNOSIS — Z5111 Encounter for antineoplastic chemotherapy: Secondary | ICD-10-CM

## 2016-07-29 LAB — CBC WITH DIFFERENTIAL/PLATELET
BASO%: 0.8 % (ref 0.0–2.0)
BASOS ABS: 0 10*3/uL (ref 0.0–0.1)
EOS ABS: 0.1 10*3/uL (ref 0.0–0.5)
EOS%: 4.3 % (ref 0.0–7.0)
HCT: 35.3 % (ref 34.8–46.6)
HEMOGLOBIN: 11.8 g/dL (ref 11.6–15.9)
LYMPH%: 55.4 % — AB (ref 14.0–49.7)
MCH: 29.3 pg (ref 25.1–34.0)
MCHC: 33.4 g/dL (ref 31.5–36.0)
MCV: 87.6 fL (ref 79.5–101.0)
MONO#: 0.4 10*3/uL (ref 0.1–0.9)
MONO%: 16.3 % — AB (ref 0.0–14.0)
NEUT#: 0.6 10*3/uL — ABNORMAL LOW (ref 1.5–6.5)
NEUT%: 23.2 % — AB (ref 38.4–76.8)
Platelets: 127 10*3/uL — ABNORMAL LOW (ref 145–400)
RBC: 4.03 10*6/uL (ref 3.70–5.45)
RDW: 14.7 % — AB (ref 11.2–14.5)
WBC: 2.6 10*3/uL — ABNORMAL LOW (ref 3.9–10.3)
lymph#: 1.4 10*3/uL (ref 0.9–3.3)

## 2016-07-29 NOTE — Telephone Encounter (Signed)
Lab was added for today,per 07/29/16 los. Follow up appointments and lab scheduled per 07/29/16 los. AVS report and appointment schedule given to patient, per 07/29/16 los.

## 2016-07-29 NOTE — Progress Notes (Signed)
[place] [phone]   [phone]  OFFICE PROGRESS NOTE  [name], MD 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 First dose on 11/15/2013. Status post 5 cycles. 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 3) Maintenance Revlimid 10 mg by mouth daily. Status post 2 months of treatment. 4) Maintenance Revlimid 15 mg by mouth daily. Status post 4 months of treatment.   CURRENT THERAPY: Maintenance Revlimid 10 mg by mouth daily. Status post 15 months.  INTERVAL HISTORY: Victoria Gates 65 y.o. female returns to the clinic today for follow up visit. The patient is feeling fine today with no specific complaints. She is tolerating her treatment with maintenance Revlimid fairly well with no significant adverse effects except for mild chemotherapy-induced neutropenia. She denied having any significant fever or chills. She denied having any significant chest pain, shortness of breath, cough or hemoptysis. She has no nausea or vomiting. She had repeat myeloma panel performed recently and she is here for evaluation and discussion of her lab results.  MEDICAL HISTORY: Past Medical History:  Diagnosis Date  . ALLERGIC RHINITIS CAUSE UNSPECIFIED 01/02/2009  . ANEMIA-IRON DEFICIENCY 04/24/2007  . Bradycardia 04/29/2016  . CERVICAL RADICULOPATHY, LEFT 02/01/2010  . Chest pain 03/2012   a. 04/15/2012 Ex MV: Ex time 10 mins, EF 63%, no ischemia/infarct.  Occas PAC's/PVC's.  . COLONIC POLYPS, HX OF 04/24/2007   hyperplastic only  . DM 03/18/2008  . Encounter for antineoplastic chemotherapy 04/29/2016  . ESOPHAGEAL STRICTURE 04/15/2007   s/p dilitation  . GERD 09/21/2010  . 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 HIATAL HERNIA 04/15/2007  . HYPERCHOLESTEROLEMIA 07/03/2009  .  Hypokalemia 11/30/2015  . Multiple myeloma ([place])   . OSTEOPOROSIS 04/24/2007    ALLERGIES:  is allergic to atorvastatin; rosuvastatin; and sulfonamide derivatives.  MEDICATIONS:  Current Outpatient Prescriptions  Medication Sig Dispense Refill  . Ascorbic Acid (VITAMIN C) 1000 MG tablet Take 1,000 mg by mouth daily.     . Calcium 500-125 MG-UNIT TABS Take 1 tablet by mouth daily.      . cholecalciferol (VITAMIN D) 1000 UNITS tablet Take 1,000 Units by mouth daily.    . lansoprazole (PREVACID) 30 MG capsule Take 30 mg by mouth daily with breakfast.     . lenalidomide (REVLIMID) 10 MG capsule Take 1 capsule (10 mg total) by mouth daily. 28 capsule 0  . loratadine (CLARITIN) 10 MG tablet Take 10 mg by mouth once.    . warfarin (COUMADIN) 2 MG tablet TAKE 1 TABLET BY MOUTH EVERY DAY 30 tablet 2   No current facility-administered medications for this visit.     SURGICAL HISTORY:  Past Surgical History:  Procedure Laterality Date  . ESOPHAGOGASTRODUODENOSCOPY  04/15/2007  . TUBAL LIGATION      REVIEW OF SYSTEMS:  Constitutional: negative Eyes: negative Ears, nose, mouth, throat, and face: negative Respiratory: negative Cardiovascular: negative Gastrointestinal: negative Genitourinary:negative Integument/breast: negative Hematologic/lymphatic: negative Musculoskeletal:negative Neurological: negative Behavioral/Psych: negative Endocrine: negative Allergic/Immunologic: negative   PHYSICAL EXAMINATION: General appearance: alert, cooperative, fatigued and no distress Head: Normocephalic, without obvious abnormality, atraumatic Neck: no adenopathy, no JVD, supple, symmetrical, trachea midline and thyroid not enlarged, symmetric, no tenderness/mass/nodules Lymph nodes: Cervical, supraclavicular, and axillary nodes normal. Resp: clear to auscultation bilaterally Back: symmetric, no curvature. ROM normal. No CVA tenderness. Cardio:  regular rate and rhythm, S1, S2 normal, no murmur,  click, rub or gallop GI: soft, non-tender; bowel sounds normal; no masses,  no organomegaly Extremities: extremities normal, atraumatic, no cyanosis or edema Neurologic: Alert and oriented X 3, normal strength and tone. Normal symmetric reflexes. Normal coordination and gait  ECOG PERFORMANCE STATUS: 1 - Symptomatic but completely ambulatory  Blood pressure 105/65, pulse (!) 55, temperature 98.2 F (36.8 C), temperature source Oral, resp. rate 18, height _0  (1.676 m), weight 142 lb 11.2 oz (64.7 kg), SpO2 100 %.  LABORATORY DATA: Lab Results  Component Value Date   WBC 2.5 (L) 07/18/2016   HGB 12.2 07/18/2016   HCT 36.1 07/18/2016   MCV 87.4 07/18/2016   PLT 131 (L) 07/18/2016      Chemistry      Component Value Date/Time   NA 141 07/18/2016 1116   K 3.8 07/18/2016 1116   CL 104 05/09/2016 1000   CO2 26 07/18/2016 1116   BUN 8.7 07/18/2016 1116   CREATININE 1.0 07/18/2016 1116      Component Value Date/Time   CALCIUM 9.4 07/18/2016 1116   ALKPHOS 72 07/18/2016 1116   AST 19 07/18/2016 1116   ALT 16 07/18/2016 1116   BILITOT 1.11 07/18/2016 1116     Myeloma panel on 01/18/2016: Beta-2 microglobulin 1.6, free Kappa chain 40.2, free lambda light chain 36.2,Kappa /lambda ratio 1.11. IgG 1158, IgA 562 and IgM 14  ASSESSMENT AND PLAN: This is a very pleasant 65 years old [place] American female who was recently diagnosed with IgA multiple myeloma with significant elevation of IgA over 7000.  She is currently undergoing systemic chemotherapy with Carfilzomib, Cytoxan and dexamethasone and tolerating it fairly well.  she status post 5 cycles followed by autologous peripheral blood stem cell transplant in July of 2015. The patient was started on maintenance treatment with Revlimid initially 10 mg by mouth daily for 2 months and currently undergoing maintenance treatment with Revlimid 15 mg by mouth daily status post 4 months. She is currently on Revlimid 10 mg by mouth daily  status post 15 months.  Her myeloma panel showed no evidence for disease progression. I will repeat CBC today and if she continues to have persistent neutropenia, I may hold her treatment with Revlimid for 1 week but she will resume her treatment again. I recommended for her to continue her treatment with Revlimid. She will come back for follow-up visit in one month for reevaluation with repeat CBC and comprehensive metabolic panel.  She was advised to call immediately she has any concerning symptoms in the interval. The patient voices understanding of current disease status and treatment options and is in agreement with the current care plan.  All questions were answered. The patient knows to call the clinic with any problems, questions or concerns. We can certainly see the patient much sooner if necessary.  Disclaimer: This note was dictated with voice recognition software. Similar sounding words can inadvertently be transcribed and may not be corrected upon review.

## 2016-08-15 ENCOUNTER — Other Ambulatory Visit: Payer: Self-pay | Admitting: *Deleted

## 2016-08-15 DIAGNOSIS — C9000 Multiple myeloma not having achieved remission: Secondary | ICD-10-CM

## 2016-08-15 MED ORDER — LENALIDOMIDE 10 MG PO CAPS: 10.0000 mg | ORAL_CAPSULE | Freq: Every day | ORAL | 0 refills | Status: DC

## 2016-08-15 NOTE — Telephone Encounter (Signed)
Revlimid auth# [id number] 08/15/16 refilled sent to [facility]. Called notified pt refill has been processed.

## 2016-08-15 NOTE — Telephone Encounter (Signed)
Received call @ 1100  Pt. called requesting a refill on her  REVLIMID 10mg . Pt. asked if she could received a call back regarding request.

## 2016-08-18 NOTE — Progress Notes (Signed)
Subjective:    Patient ID: Victoria Gates, female    DOB: 1950/10/23, 65 y.o.   MRN: [id number]  HPI Pt is here for regular wellness examination, and is feeling pretty well in general, and says chronic med probs are stable, except as noted below Past Medical History:  Diagnosis Date  . ALLERGIC RHINITIS CAUSE UNSPECIFIED 01/02/2009  . ANEMIA-IRON DEFICIENCY 04/24/2007  . Bradycardia 04/29/2016  . CERVICAL RADICULOPATHY, LEFT 02/01/2010  . Chest pain 03/2012   a. 04/15/2012 Ex MV: Ex time 10 mins, EF 63%, no ischemia/infarct.  Occas PAC's/PVC's.  . COLONIC POLYPS, HX OF 04/24/2007   hyperplastic only  . DM 03/18/2008  . Encounter for antineoplastic chemotherapy 04/29/2016  . ESOPHAGEAL STRICTURE 04/15/2007   s/p dilitation  . GERD 09/21/2010  . 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 HIATAL HERNIA 04/15/2007  . HYPERCHOLESTEROLEMIA 07/03/2009  . Hypokalemia 11/30/2015  . Multiple myeloma ([place])   . OSTEOPOROSIS 04/24/2007    Past Surgical History:  Procedure Laterality Date  . ESOPHAGOGASTRODUODENOSCOPY  04/15/2007  . TUBAL LIGATION      Social History   Social History  . Marital status: Married    Spouse name: N/A  . Number of children: N/A  . Years of education: N/A   Occupational History  . 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 Smoking status: Never Smoker  . Smokeless tobacco: Never Used  . Alcohol use No  . Drug use: No  . Sexual activity: Yes   Other Topics Concern  . Not on file   Social History Narrative   Lives in [place] with [place].   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    Current Outpatient Prescriptions on File Prior to Visit  Medication Sig Dispense Refill  . Ascorbic Acid (VITAMIN C) 1000 MG tablet Take 1,000 mg by mouth daily.     . Calcium 500-125 MG-UNIT TABS Take 1 tablet by mouth daily.      . cholecalciferol (VITAMIN D) 1000 UNITS tablet Take 1,000 Units by mouth daily.    . lansoprazole (PREVACID) 30 MG capsule Take 30 mg  by mouth daily with breakfast.     . lenalidomide (REVLIMID) 10 MG capsule Take 1 capsule (10 mg total) by mouth daily. 28 capsule 0  . loratadine (CLARITIN) 10 MG tablet Take 10 mg by mouth once.    . warfarin (COUMADIN) 2 MG tablet TAKE 1 TABLET BY MOUTH EVERY DAY 30 tablet 2   No current facility-administered medications on file prior to visit.     Allergies  Allergen Reactions  . Atorvastatin     REACTION: myalgias  . Rosuvastatin     REACTION: myalgias  . Sulfonamide Derivatives     Unknown reaction     Family History  Problem Relation Age of Onset  . Brain cancer Mother   . Heart disease Mother   . Lung cancer Father   . Heart disease Father   . Diabetes Sister   . Heart disease Sister   . Diabetes Brother   . Heart disease Brother   . Cancer Neg Hx     No FH of Colon Cancer  . Colon cancer Neg Hx   . Esophageal cancer Neg Hx   . Rectal cancer Neg Hx   . Stomach cancer Neg Hx     BP 128/64   Pulse 75   Wt 144 lb (65.3 kg)   SpO2 98%   BMI 23.24 kg/m  Review of Systems Constitutional: Negative for fever.       She has lost more weight, due to her efforts .   HENT: Negative for hearing loss.   Eyes: Negative for visual disturbance.  Respiratory: Negative for shortness of breath.   Cardiovascular: Negative for chest pain. Pulm: denies sob  Gastrointestinal: Negative for anal bleeding.  Endocrine: Negative for cold intolerance.  Genitourinary: Negative for hematuria.  Musculoskeletal: Negative for back pain.  Skin: Negative for rash.  Allergic/Immunologic: Positive for environmental allergies.  Neurological: Negative for syncope and numbness.  Hematological: Does not bruise/bleed easily.  Psychiatric/Behavioral: Negative for dysphoric mood.     Objective:   Physical Exam VS: see vs page GEN: no distress HEAD: head: no deformity eyes: no periorbital swelling, no proptosis external nose and ears are normal mouth: no lesion seen NECK: supple,  thyroid is not enlarged CHEST WALL: no deformity LUNGS:  Clear to auscultation BREASTS: sees gyn.   CV: reg rate and rhythm, no murmur ABD: abdomen is soft, nontender.  no hepatosplenomegaly.  not distended.  no hernia GENITALIA/RECTAL: sees gyn MUSCULOSKELETAL: muscle bulk and strength are grossly normal.  no obvious joint swelling.  gait is normal and steady EXTEMITIES: no deformity.  no ulcer on the feet.  feet are of normal color and temp.  no edema PULSES: dorsalis pedis intact bilat.  no carotid bruit NEURO:  cn 2-12 grossly intact.   readily moves all 4's.  sensation is intact to touch on the feet, but decreased from normal.  SKIN:  Normal texture and temperature.  No rash or suspicious lesion is visible.   NODES:  None palpable at the neck PSYCH: alert, oriented x3.  Does not appear anxious nor depressed.       Assessment & Plan:  Wellness visit today, with problems stable, except as noted. Please consider these measures for your health:  minimize alcohol.  Do not use tobacco products.  Have a colonoscopy at least every 10 years from age 66.  Women should have an annual mammogram from age 37.  Keep firearms safely stored.  Always use seat belts.  have working smoke alarms in your home.  See an eye doctor and dentist regularly.  Never drive under the influence of alcohol or drugs (including prescription drugs).  Those with fair skin should take precautions against the sun, and should carefully examine their skin once per month, for any new or changed moles. It is critically important to prevent falling down (keep floor areas well-lit, dry, and free of loose objects.  If you have a cane, walker, or wheelchair, you should use it, even for short trips around the house.  Wear flat-soled shoes.  Also, try not to rush)   Subjective:   Patient here for Medicare annual wellness visit and management of other chronic and acute problems.     Risk factors: advanced age    67 of [employer]  Providing Medical Care to Patient:  See "snapshot"   Activities of Daily Living: In your present state of health, do you have any difficulty performing the following activities (lives with husband)?:  Preparing food and eating?: No  Bathing yourself: No  Getting dressed: No  Using the toilet:No  Moving around from place to place: No  In the past year have you fallen or had a near fall?:No    Home Safety: Has smoke detector and wears seat belts. firearms are safely stored  Diet and Exercise  Current exercise habits: pt says good Dietary  issues discussed: pt reports a healthy diet   Depression Screen  Q1: Over the past two weeks, have you felt down, depressed or hopeless? no  Q2: Over the past two weeks, have you felt little interest or pleasure in doing things? no   The following portions of the patient's history were reviewed and updated as appropriate: allergies, current medications, past family history, past medical history, past social history, past surgical history and problem list.   Review of Systems  Denies hearing loss, and visual loss Objective:   Vision:  Advertising account executive, so she declines VA today.  Hearing: grossly normal Body mass index:  See vs page Msk: pt easily and quickly performs "get-up-and-go" from a sitting position.  Cognitive Impairment Assessment: cognition, memory and judgment appear normal.  remembers 1/3 at 5 minutes (? effort).  excellent recall.  can easily read and write a sentence.  alert and oriented x3.    Assessment:   Medicare wellness utd on preventive parameters    Plan:   During the course of the visit the patient was educated and counseled about appropriate screening and preventive services including:       Fall prevention   Screening mammography is up to date  Bone densitometry screening is up to date Diabetes screening is up to date.  Diet info has been given  Vaccines are given today  Patient Instructions (the written plan)  was given to the patient.

## 2016-08-20 ENCOUNTER — Encounter: Payer: Self-pay | Admitting: Endocrinology

## 2016-08-20 ENCOUNTER — Ambulatory Visit (INDEPENDENT_AMBULATORY_CARE_PROVIDER_SITE_OTHER): Payer: 59 | Admitting: Endocrinology

## 2016-08-20 VITALS — BP 128/64 | HR 75 | Wt 144.0 lb

## 2016-08-20 DIAGNOSIS — Z0000 Encounter for general adult medical examination without abnormal findings: Secondary | ICD-10-CM | POA: Diagnosis not present

## 2016-08-20 DIAGNOSIS — Z23 Encounter for immunization: Secondary | ICD-10-CM

## 2016-08-20 NOTE — Patient Instructions (Addendum)
Please consider these measures for your health:  minimize alcohol.  Do not use tobacco products.  Have a colonoscopy at least every 10 years from age 65.  Women should have an annual mammogram from age 86.  Keep firearms safely stored.  Always use seat belts.  have working smoke alarms in your home.  See an eye doctor and dentist regularly.  Never drive under the influence of alcohol or drugs (including prescription drugs).   please let me know what your wishes would be, if artificial life support measures should become necessary.  It is critically important to prevent falling down (keep floor areas well-lit, dry, and free of loose objects.  If you have a cane, walker, or wheelchair, you should use it, even for short trips around the house.  Wear flat-soled shoes.  Also, try not to rush).  Please return in 1 year.

## 2016-09-02 ENCOUNTER — Encounter: Payer: Self-pay | Admitting: Internal Medicine

## 2016-09-02 ENCOUNTER — Other Ambulatory Visit (HOSPITAL_BASED_OUTPATIENT_CLINIC_OR_DEPARTMENT_OTHER): Payer: 59

## 2016-09-02 ENCOUNTER — Telehealth: Payer: Self-pay | Admitting: Internal Medicine

## 2016-09-02 ENCOUNTER — Ambulatory Visit (HOSPITAL_BASED_OUTPATIENT_CLINIC_OR_DEPARTMENT_OTHER): Payer: 59 | Admitting: Internal Medicine

## 2016-09-02 VITALS — BP 126/58 | HR 44 | Temp 98.2°F | Resp 18 | Ht 66.0 in | Wt 145.9 lb

## 2016-09-02 DIAGNOSIS — E876 Hypokalemia: Secondary | ICD-10-CM

## 2016-09-02 DIAGNOSIS — D701 Agranulocytosis secondary to cancer chemotherapy: Secondary | ICD-10-CM | POA: Diagnosis not present

## 2016-09-02 DIAGNOSIS — Z5111 Encounter for antineoplastic chemotherapy: Secondary | ICD-10-CM

## 2016-09-02 DIAGNOSIS — T451X5A Adverse effect of antineoplastic and immunosuppressive drugs, initial encounter: Secondary | ICD-10-CM

## 2016-09-02 DIAGNOSIS — C9000 Multiple myeloma not having achieved remission: Secondary | ICD-10-CM

## 2016-09-02 LAB — CBC WITH DIFFERENTIAL/PLATELET
BASO%: 2 % (ref 0.0–2.0)
BASOS ABS: 0.1 10*3/uL (ref 0.0–0.1)
EOS ABS: 0.3 10*3/uL (ref 0.0–0.5)
EOS%: 9.3 % — AB (ref 0.0–7.0)
HEMATOCRIT: 32.8 % — AB (ref 34.8–46.6)
HEMOGLOBIN: 11 g/dL — AB (ref 11.6–15.9)
LYMPH%: 47.8 % (ref 14.0–49.7)
MCH: 29.2 pg (ref 25.1–34.0)
MCHC: 33.5 g/dL (ref 31.5–36.0)
MCV: 87 fL (ref 79.5–101.0)
MONO#: 0.4 10*3/uL (ref 0.1–0.9)
MONO%: 12.3 % (ref 0.0–14.0)
NEUT#: 0.9 10*3/uL — ABNORMAL LOW (ref 1.5–6.5)
NEUT%: 28.6 % — AB (ref 38.4–76.8)
Platelets: 155 10*3/uL (ref 145–400)
RBC: 3.77 10*6/uL (ref 3.70–5.45)
RDW: 14.7 % — AB (ref 11.2–14.5)
WBC: 3 10*3/uL — ABNORMAL LOW (ref 3.9–10.3)
lymph#: 1.4 10*3/uL (ref 0.9–3.3)

## 2016-09-02 LAB — COMPREHENSIVE METABOLIC PANEL
ALBUMIN: 3.3 g/dL — AB (ref 3.5–5.0)
ALK PHOS: 94 U/L (ref 40–150)
ALT: 18 U/L (ref 0–55)
AST: 18 U/L (ref 5–34)
Anion Gap: 9 mEq/L (ref 3–11)
BUN: 8.6 mg/dL (ref 7.0–26.0)
CALCIUM: 9.1 mg/dL (ref 8.4–10.4)
CHLORIDE: 107 meq/L (ref 98–109)
CO2: 23 mEq/L (ref 22–29)
Creatinine: 0.9 mg/dL (ref 0.6–1.1)
EGFR: 78 mL/min/{1.73_m2} — AB (ref >90)
Glucose: 84 mg/dl (ref 70–140)
POTASSIUM: 3.3 meq/L — AB (ref 3.5–5.1)
SODIUM: 139 meq/L (ref 136–145)
Total Bilirubin: 0.74 mg/dL (ref 0.20–1.20)
Total Protein: 6.8 g/dL (ref 6.4–8.3)

## 2016-09-02 NOTE — Progress Notes (Signed)
[place] [phone]   [phone]) [phone]  OFFICE PROGRESS NOTE  [name], MD 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  PRIOR THERAPY: 1) Systemic chemotherapy with Carfilzomib, Cytoxan and dexamethasone. First dose on 11/15/2013. Status post 5 cycles. 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 3) Maintenance Revlimid 10 mg by mouth daily. Status post 2 months of treatment. 4) Maintenance Revlimid 15 mg by mouth daily. Status post 4 months of treatment.  CURRENT THERAPY: Maintenance treatment with Revlimid 10 mg by mouth daily status post 16 months. She is expected to complete 2 years of treatment in February 2018.  INTERVAL HISTORY: Victoria Gates 65 y.o. female returns to the clinic today for follow-up visit accompanied by her husband. The patient is feeling fine today with no specific complaints. She enjoyed her Thanksgiving with her family. She denied having any current fever or chills. She has no nausea or vomiting. She is tolerating her treatment with Revlimid well except for the chemotherapy-induced neutropenia. She denied having any chest pain, shortness of breath, cough or hemoptysis. She is here today for routine evaluation and monitoring of her Revlimid treatment.  MEDICAL HISTORY: Past Medical History:  Diagnosis Date  . ALLERGIC RHINITIS CAUSE UNSPECIFIED 01/02/2009  . ANEMIA-IRON DEFICIENCY 04/24/2007  . Bradycardia 04/29/2016  . CERVICAL RADICULOPATHY, LEFT 02/01/2010  . Chest pain 03/2012   a. 04/15/2012 Ex MV: Ex time 10 mins, EF 63%, no ischemia/infarct.  Occas PAC's/PVC's.  . COLONIC POLYPS, HX OF 04/24/2007   hyperplastic only  . DM 03/18/2008  . Encounter for antineoplastic chemotherapy 04/29/2016  . ESOPHAGEAL STRICTURE 04/15/2007   s/p dilitation  . GERD 09/21/2010  . 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 HIATAL HERNIA 04/15/2007    . HYPERCHOLESTEROLEMIA 07/03/2009  . Hypokalemia 11/30/2015  . Multiple myeloma ([place])   . OSTEOPOROSIS 04/24/2007    ALLERGIES:  is allergic to atorvastatin; rosuvastatin; and sulfonamide derivatives.  MEDICATIONS:  Current Outpatient Prescriptions  Medication Sig Dispense Refill  . Ascorbic Acid (VITAMIN C) 1000 MG tablet Take 1,000 mg by mouth daily.     . Calcium 500-125 MG-UNIT TABS Take 1 tablet by mouth daily.      . cholecalciferol (VITAMIN D) 1000 UNITS tablet Take 1,000 Units by mouth daily.    . lansoprazole (PREVACID) 30 MG capsule Take 30 mg by mouth daily with breakfast.     . lenalidomide (REVLIMID) 10 MG capsule Take 1 capsule (10 mg total) by mouth daily. 28 capsule 0  . loratadine (CLARITIN) 10 MG tablet Take 10 mg by mouth once.    . warfarin (COUMADIN) 2 MG tablet TAKE 1 TABLET BY MOUTH EVERY DAY 30 tablet 2   No current facility-administered medications for this visit.     SURGICAL HISTORY:  Past Surgical History:  Procedure Laterality Date  . ESOPHAGOGASTRODUODENOSCOPY  04/15/2007  . TUBAL LIGATION      REVIEW OF SYSTEMS:  A comprehensive review of systems was negative except for: Constitutional: positive for fatigue   PHYSICAL EXAMINATION: General appearance: alert, cooperative and no distress Head: Normocephalic, without obvious abnormality, atraumatic Neck: no adenopathy, no JVD, supple, symmetrical, trachea midline and thyroid not enlarged, symmetric, no tenderness/mass/nodules Lymph nodes: Cervical, supraclavicular, and axillary nodes normal. Resp: clear to auscultation bilaterally Back: symmetric, no curvature. ROM normal. No CVA tenderness. Cardio: regular rate and rhythm, S1, S2 normal, no murmur, click, rub  or gallop GI: soft, non-tender; bowel sounds normal; no masses,  no organomegaly Extremities: extremities normal, atraumatic, no cyanosis or edema  ECOG PERFORMANCE STATUS: 1 - Symptomatic but completely ambulatory  Blood pressure (!) 126/58,  pulse (!) 44, temperature 98.2 F (36.8 C), temperature source Oral, resp. rate 18, height _0  (1.676 m), weight 145 lb 14.4 oz (66.2 kg), SpO2 100 %.  LABORATORY DATA: Lab Results  Component Value Date   WBC 3.0 (L) 09/02/2016   HGB 11.0 (L) 09/02/2016   HCT 32.8 (L) 09/02/2016   MCV 87.0 09/02/2016   PLT 155 09/02/2016      Chemistry      Component Value Date/Time   NA 139 09/02/2016 1229   K 3.3 (L) 09/02/2016 1229   CL 104 05/09/2016 1000   CO2 23 09/02/2016 1229   BUN 8.6 09/02/2016 1229   CREATININE 0.9 09/02/2016 1229      Component Value Date/Time   CALCIUM 9.1 09/02/2016 1229   ALKPHOS 94 09/02/2016 1229   AST 18 09/02/2016 1229   ALT 18 09/02/2016 1229   BILITOT 0.74 09/02/2016 1229       RADIOGRAPHIC STUDIES: No results found.  ASSESSMENT AND PLAN: This is a very pleasant 65 years old African-American female with multiple myeloma, IgA subtype status post several regimen off treatment including systemic chemotherapy with Carfilzomib, Cytoxan and dexamethasone followed by autologous peripheral blood stem cell transplant and currently on maintenance treatment with Revlimid status post total 22 months. The patient is tolerating her treatment well with no significant adverse effects except for the chemotherapy-induced neutropenia. I discussed the lab result with the patient and her husband today. I recommended for her to continue her treatment with Revlimid as a scheduled. I will see her back for follow-up visit in one month for reevaluation with repeat CBC, comprehensive metabolic panel and LDH. For the chemotherapy-induced neutropenia, I will continue to monitor this closely and consider the patient for Neupogen if needed. For the hypokalemia, advised the patient to increase her potassium diet. The patient voices understanding of current disease status and treatment options and is in agreement with the current care plan.  All questions were answered. The patient  knows to call the clinic with any problems, questions or concerns. We can certainly see the patient much sooner if necessary.   Disclaimer: This note was dictated with voice recognition software. Similar sounding words can inadvertently be transcribed and may not be corrected upon review.

## 2016-09-02 NOTE — Telephone Encounter (Signed)
Appointments scheduled per 12/4 LOS. Patient given AVS report and calendars with future scheduled appointments.  °

## 2016-09-09 ENCOUNTER — Other Ambulatory Visit: Payer: Self-pay | Admitting: Medical Oncology

## 2016-09-09 DIAGNOSIS — C9000 Multiple myeloma not having achieved remission: Secondary | ICD-10-CM

## 2016-09-09 MED ORDER — LENALIDOMIDE 10 MG PO CAPS: 10.0000 mg | ORAL_CAPSULE | Freq: Every day | ORAL | 0 refills | Status: DC

## 2016-09-24 ENCOUNTER — Other Ambulatory Visit: Payer: Self-pay | Admitting: Nurse Practitioner

## 2016-10-02 ENCOUNTER — Other Ambulatory Visit: Payer: Self-pay | Admitting: Internal Medicine

## 2016-10-03 ENCOUNTER — Ambulatory Visit (HOSPITAL_BASED_OUTPATIENT_CLINIC_OR_DEPARTMENT_OTHER): Payer: 59 | Admitting: Internal Medicine

## 2016-10-03 ENCOUNTER — Encounter: Payer: Self-pay | Admitting: Internal Medicine

## 2016-10-03 ENCOUNTER — Other Ambulatory Visit (HOSPITAL_BASED_OUTPATIENT_CLINIC_OR_DEPARTMENT_OTHER): Payer: 59

## 2016-10-03 ENCOUNTER — Telehealth: Payer: Self-pay | Admitting: Internal Medicine

## 2016-10-03 VITALS — BP 111/65 | HR 55 | Temp 97.8°F | Resp 18 | Ht 66.0 in | Wt 144.4 lb

## 2016-10-03 DIAGNOSIS — Z5111 Encounter for antineoplastic chemotherapy: Secondary | ICD-10-CM

## 2016-10-03 DIAGNOSIS — C9000 Multiple myeloma not having achieved remission: Secondary | ICD-10-CM

## 2016-10-03 DIAGNOSIS — D61810 Antineoplastic chemotherapy induced pancytopenia: Secondary | ICD-10-CM

## 2016-10-03 LAB — CBC WITH DIFFERENTIAL/PLATELET
BASO%: 1.6 % (ref 0.0–2.0)
Basophils Absolute: 0 10*3/uL (ref 0.0–0.1)
EOS ABS: 0.2 10*3/uL (ref 0.0–0.5)
EOS%: 6 % (ref 0.0–7.0)
HCT: 34.2 % — ABNORMAL LOW (ref 34.8–46.6)
HEMOGLOBIN: 11.3 g/dL — AB (ref 11.6–15.9)
LYMPH%: 56.4 % — ABNORMAL HIGH (ref 14.0–49.7)
MCH: 29.4 pg (ref 25.1–34.0)
MCHC: 33 g/dL (ref 31.5–36.0)
MCV: 88.8 fL (ref 79.5–101.0)
MONO#: 0.4 10*3/uL (ref 0.1–0.9)
MONO%: 14.4 % — AB (ref 0.0–14.0)
NEUT%: 21.6 % — ABNORMAL LOW (ref 38.4–76.8)
NEUTROS ABS: 0.5 10*3/uL — AB (ref 1.5–6.5)
Platelets: 116 10*3/uL — ABNORMAL LOW (ref 145–400)
RBC: 3.85 10*6/uL (ref 3.70–5.45)
RDW: 15.6 % — AB (ref 11.2–14.5)
WBC: 2.5 10*3/uL — ABNORMAL LOW (ref 3.9–10.3)
lymph#: 1.4 10*3/uL (ref 0.9–3.3)

## 2016-10-03 LAB — COMPREHENSIVE METABOLIC PANEL
ALBUMIN: 3.6 g/dL (ref 3.5–5.0)
ALK PHOS: 75 U/L (ref 40–150)
ALT: 19 U/L (ref 0–55)
AST: 18 U/L (ref 5–34)
Anion Gap: 7 mEq/L (ref 3–11)
BUN: 9 mg/dL (ref 7.0–26.0)
CO2: 26 mEq/L (ref 22–29)
CREATININE: 1 mg/dL (ref 0.6–1.1)
Calcium: 9.4 mg/dL (ref 8.4–10.4)
Chloride: 109 mEq/L (ref 98–109)
EGFR: 71 mL/min/{1.73_m2} — AB (ref >90)
GLUCOSE: 89 mg/dL (ref 70–140)
Potassium: 3.8 mEq/L (ref 3.5–5.1)
SODIUM: 142 meq/L (ref 136–145)
Total Bilirubin: 1.12 mg/dL (ref 0.20–1.20)
Total Protein: 6.6 g/dL (ref 6.4–8.3)

## 2016-10-03 LAB — LACTATE DEHYDROGENASE: LDH: 122 U/L — ABNORMAL LOW (ref 125–245)

## 2016-10-03 NOTE — Telephone Encounter (Signed)
Appointments scheduled per 1/4 LOS. Patient given AVS report and calendars with future scheduled appointments. °

## 2016-10-03 NOTE — Progress Notes (Signed)
[place] Telephone:([phone]   Fax:([phone]  OFFICE PROGRESS NOTE  Victoria Shin, Victoria Gates 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  PRIOR THERAPY: 1) Systemic chemotherapy with Carfilzomib, Cytoxan and dexamethasone. First dose on 11/15/2013. Status post 5 cycles. 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 3) Maintenance Revlimid 10 mg by mouth daily. Status post 2 months of treatment. 4) Maintenance Revlimid 15 mg by mouth daily. Status post 4 months of treatment.  CURRENT THERAPY: Maintenance treatment with Revlimid 10 mg by mouth daily status post 17 months. She is expected to complete 2 years of treatment in February 2018.  INTERVAL HISTORY: Victoria Gates 66 y.o. female came to the clinic today for follow-up visit accompanied by her husband. The patient is feeling fine today was no specific complaints. She enjoyed her Christmas time and new year with her family. She denied having any current fever or chills. She has no nausea or vomiting. She has no significant chest pain, shortness of breath, cough or hemoptysis. The patient is tolerating her treatment with Revlimid fairly well. She is here today for evaluation with repeat blood work for monitoring of her treatment.  MEDICAL HISTORY: Past Medical History:  Diagnosis Date  . ALLERGIC RHINITIS CAUSE UNSPECIFIED 01/02/2009  . ANEMIA-IRON DEFICIENCY 04/24/2007  . Bradycardia 04/29/2016  . CERVICAL RADICULOPATHY, LEFT 02/01/2010  . Chest pain 03/2012   a. 04/15/2012 Ex MV: Ex time 10 mins, EF 63%, no ischemia/infarct.  Occas PAC's/PVC's.  . COLONIC POLYPS, HX OF 04/24/2007   hyperplastic only  . DM 03/18/2008  . Encounter for antineoplastic chemotherapy 04/29/2016  . ESOPHAGEAL STRICTURE 04/15/2007   s/p dilitation  . GERD 09/21/2010  . 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 HIATAL HERNIA 04/15/2007  .  HYPERCHOLESTEROLEMIA 07/03/2009  . Hypokalemia 11/30/2015  . Multiple myeloma ([place])   . OSTEOPOROSIS 04/24/2007    ALLERGIES:  is allergic to atorvastatin; rosuvastatin; and sulfonamide derivatives.  MEDICATIONS:  Current Outpatient Prescriptions  Medication Sig Dispense Refill  . Ascorbic Acid (VITAMIN C) 1000 MG tablet Take 1,000 mg by mouth daily.     . Calcium 500-125 MG-UNIT TABS Take 1 tablet by mouth daily.      . cholecalciferol (VITAMIN D) 1000 UNITS tablet Take 1,000 Units by mouth daily.    . lansoprazole (PREVACID) 30 MG capsule Take 30 mg by mouth daily with breakfast.     . lenalidomide (REVLIMID) 10 MG capsule Take 1 capsule (10 mg total) by mouth daily. 28 capsule 0  . loratadine (CLARITIN) 10 MG tablet Take 10 mg by mouth once.    . warfarin (COUMADIN) 2 MG tablet TAKE 1 TABLET BY MOUTH EVERY DAY 30 tablet 2   No current facility-administered medications for this visit.     SURGICAL HISTORY:  Past Surgical History:  Procedure Laterality Date  . ESOPHAGOGASTRODUODENOSCOPY  04/15/2007  . TUBAL LIGATION      REVIEW OF SYSTEMS:  A comprehensive review of systems was negative.   PHYSICAL EXAMINATION: General appearance: alert, cooperative and no distress Head: Normocephalic, without obvious abnormality, atraumatic Neck: no adenopathy, no JVD, supple, symmetrical, trachea midline and thyroid not enlarged, symmetric, no tenderness/mass/nodules Lymph nodes: Cervical, supraclavicular, and axillary nodes normal. Resp: clear to auscultation bilaterally Back: symmetric, no curvature. ROM normal. No CVA tenderness. Cardio: regular rate and rhythm, S1, S2 normal, no murmur, click, rub or gallop GI: soft,  non-tender; bowel sounds normal; no masses,  no organomegaly Extremities: extremities normal, atraumatic, no cyanosis or edema  ECOG PERFORMANCE STATUS: 0 - Asymptomatic  Blood pressure 111/65, pulse (!) 55, temperature 97.8 F (36.6 C), temperature source Oral, resp. rate  18, height _0  (1.676 m), weight 144 lb 6.4 oz (65.5 kg), SpO2 99 %.  LABORATORY DATA: Lab Results  Component Value Date   WBC 2.5 (L) 10/03/2016   HGB 11.3 (L) 10/03/2016   HCT 34.2 (L) 10/03/2016   MCV 88.8 10/03/2016   PLT 116 (L) 10/03/2016      Chemistry      Component Value Date/Time   NA 139 09/02/2016 1229   K 3.3 (L) 09/02/2016 1229   CL 104 05/09/2016 1000   CO2 23 09/02/2016 1229   BUN 8.6 09/02/2016 1229   CREATININE 0.9 09/02/2016 1229      Component Value Date/Time   CALCIUM 9.1 09/02/2016 1229   ALKPHOS 94 09/02/2016 1229   AST 18 09/02/2016 1229   ALT 18 09/02/2016 1229   BILITOT 0.74 09/02/2016 1229       RADIOGRAPHIC STUDIES: No results found.  ASSESSMENT AND PLAN:  This is a very pleasant 66 years old African-American female with multiple myeloma, IgA subtype status post induction systemic chemotherapy was carboplatin, Cytoxan and dexamethasone followed by peripheral blood autologous stem cell transplant and she has been on maintenance Revlimid for around 23 months now. She is tolerating her treatment well except for pancytopenia. I recommended for the patient to complete the last months of her treatment with Revlimid. I would see her back for follow-up visit in one month for reevaluation with repeat myeloma panel. For the drug-induced neutropenia, we will continue to monitor this closely and the patient was advised to call immediately if she has any fever or chills. She was advised to call immediately if she has any concerning symptoms in the interval. The patient voices understanding of current disease status and treatment options and is in agreement with the current care plan. All questions were answered. The patient knows to call the clinic with any problems, questions or concerns. We can certainly see the patient much sooner if necessary. I spent 10 minutes counseling the patient face to face. The total time spent in the appointment was 15  minutes.  Disclaimer: This note was dictated with voice recognition software. Similar sounding words can inadvertently be transcribed and may not be corrected upon review.

## 2016-10-08 ENCOUNTER — Other Ambulatory Visit: Payer: Self-pay | Admitting: Medical Oncology

## 2016-10-29 ENCOUNTER — Telehealth: Payer: Self-pay | Admitting: Internal Medicine

## 2016-10-29 NOTE — Telephone Encounter (Signed)
Pt called to r/s lab/MD appt to 2/15 and 2/22 due to insurance issues. Gave pt new appt date/time per request

## 2016-10-31 ENCOUNTER — Other Ambulatory Visit: Payer: 59

## 2016-11-07 ENCOUNTER — Ambulatory Visit: Payer: 59 | Admitting: Internal Medicine

## 2016-11-14 ENCOUNTER — Other Ambulatory Visit: Payer: BLUE CROSS/BLUE SHIELD

## 2016-11-14 DIAGNOSIS — C9000 Multiple myeloma not having achieved remission: Secondary | ICD-10-CM

## 2016-11-14 LAB — CBC WITH DIFFERENTIAL/PLATELET
BASO%: 1.5 % (ref 0.0–2.0)
BASOS ABS: 0 10*3/uL (ref 0.0–0.1)
EOS ABS: 0.1 10*3/uL (ref 0.0–0.5)
EOS%: 4 % (ref 0.0–7.0)
HEMATOCRIT: 36.6 % (ref 34.8–46.6)
HEMOGLOBIN: 12.3 g/dL (ref 11.6–15.9)
LYMPH#: 1.6 10*3/uL (ref 0.9–3.3)
LYMPH%: 53.5 % — ABNORMAL HIGH (ref 14.0–49.7)
MCH: 30.3 pg (ref 25.1–34.0)
MCHC: 33.6 g/dL (ref 31.5–36.0)
MCV: 90.2 fL (ref 79.5–101.0)
MONO#: 0.3 10*3/uL (ref 0.1–0.9)
MONO%: 10.4 % (ref 0.0–14.0)
NEUT%: 30.6 % — ABNORMAL LOW (ref 38.4–76.8)
NEUTROS ABS: 0.9 10*3/uL — AB (ref 1.5–6.5)
PLATELETS: 132 10*3/uL — AB (ref 145–400)
RBC: 4.06 10*6/uL (ref 3.70–5.45)
RDW: 14.7 % — AB (ref 11.2–14.5)
WBC: 2.9 10*3/uL — AB (ref 3.9–10.3)

## 2016-11-14 LAB — COMPREHENSIVE METABOLIC PANEL
ALBUMIN: 3.9 g/dL (ref 3.5–5.0)
ALK PHOS: 77 U/L (ref 40–150)
ALT: 30 U/L (ref 0–55)
ANION GAP: 10 meq/L (ref 3–11)
AST: 29 U/L (ref 5–34)
BILIRUBIN TOTAL: 1.07 mg/dL (ref 0.20–1.20)
BUN: 12.1 mg/dL (ref 7.0–26.0)
CO2: 22 meq/L (ref 22–29)
CREATININE: 1 mg/dL (ref 0.6–1.1)
Calcium: 9.9 mg/dL (ref 8.4–10.4)
Chloride: 107 mEq/L (ref 98–109)
EGFR: 66 mL/min/{1.73_m2} — ABNORMAL LOW (ref >90)
Glucose: 48 mg/dl — ABNORMAL LOW (ref 70–140)
Potassium: 3.6 mEq/L (ref 3.5–5.1)
Sodium: 140 mEq/L (ref 136–145)
TOTAL PROTEIN: 7.2 g/dL (ref 6.4–8.3)

## 2016-11-14 LAB — LACTATE DEHYDROGENASE: LDH: 141 U/L (ref 125–245)

## 2016-11-15 LAB — IGG, IGA, IGM
IgA, Qn, Serum: 491 mg/dL — ABNORMAL HIGH (ref 87–352)
IgG, Qn, Serum: 1136 mg/dL (ref 700–1600)
IgM, Qn, Serum: 13 mg/dL — ABNORMAL LOW (ref 26–217)

## 2016-11-15 LAB — BETA 2 MICROGLOBULIN, SERUM: BETA 2: 1.6 mg/L (ref 0.6–2.4)

## 2016-11-15 LAB — KAPPA/LAMBDA LIGHT CHAINS
IG KAPPA FREE LIGHT CHAIN: 23.4 mg/L — AB (ref 3.3–19.4)
Ig Lambda Free Light Chain: 20.9 mg/L (ref 5.7–26.3)
Kappa/Lambda FluidC Ratio: 1.12 (ref 0.26–1.65)

## 2016-11-16 ENCOUNTER — Other Ambulatory Visit: Payer: Self-pay | Admitting: Internal Medicine

## 2016-11-16 DIAGNOSIS — C9000 Multiple myeloma not having achieved remission: Secondary | ICD-10-CM

## 2016-11-21 ENCOUNTER — Ambulatory Visit (HOSPITAL_BASED_OUTPATIENT_CLINIC_OR_DEPARTMENT_OTHER): Payer: BLUE CROSS/BLUE SHIELD | Admitting: Internal Medicine

## 2016-11-21 ENCOUNTER — Encounter: Payer: Self-pay | Admitting: Internal Medicine

## 2016-11-21 ENCOUNTER — Telehealth: Payer: Self-pay | Admitting: Internal Medicine

## 2016-11-21 VITALS — BP 141/85 | HR 81 | Temp 98.4°F | Resp 18 | Ht 66.0 in | Wt 146.8 lb

## 2016-11-21 DIAGNOSIS — D701 Agranulocytosis secondary to cancer chemotherapy: Secondary | ICD-10-CM | POA: Diagnosis not present

## 2016-11-21 DIAGNOSIS — C9000 Multiple myeloma not having achieved remission: Secondary | ICD-10-CM

## 2016-11-21 NOTE — Telephone Encounter (Signed)
Appointments scheduled, per 11/21/16 los. Patient was given a copy of the AVS report and appointment schedule,per 11/21/16 los.

## 2016-11-21 NOTE — Progress Notes (Signed)
    [facility] [phone]   [phone]  OFFICE PROGRESS NOTE  ELLISON, SEAN, MD 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  PRIOR THERAPY: 1) Systemic chemotherapy with Carfilzomib, Cytoxan and dexamethasone. First dose on 11/15/2013. Status post 5 cycles. 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 3) Maintenance Revlimid 10 mg by mouth daily. Status post 2 months of treatment. 4) Maintenance Revlimid 15 mg by mouth daily. Status post 4 months of treatment. 5) Maintenance treatment with Revlimid 10 mg by mouth daily status post 17 months. She completed 2 years of treatment in February 2018.  CURRENT THERAPY: Observation.  INTERVAL HISTORY: Victoria Gates 66 y.o. female came to the clinic today for follow-up visit accompanied by her husband. The patient completed 2 years of maintenance Revlimid and tolerating her treatment well except for the chemotherapy-induced neutropenia. She is feeling fine today with no specific complaints. She denied having any fever or chills. She has no nausea, vomiting, diarrhea or constipation. She denied having any significant chest pain, shortness of breath, cough or hemoptysis. She had repeat myeloma panel performed recently and she is here for evaluation and discussion of her lab results and recommendation regarding her condition.  MEDICAL HISTORY: Past Medical History:  Diagnosis Date  . ALLERGIC RHINITIS CAUSE UNSPECIFIED 01/02/2009  . ANEMIA-IRON DEFICIENCY 04/24/2007  . Bradycardia 04/29/2016  . CERVICAL RADICULOPATHY, LEFT 02/01/2010  . Chest pain 03/2012   a. 04/15/2012 Ex MV: Ex time 10 mins, EF 63%, no ischemia/infarct.  Occas PAC's/PVC's.  . COLONIC POLYPS, HX OF 04/24/2007   hyperplastic only  . DM 03/18/2008  . Encounter for antineoplastic chemotherapy 04/29/2016  . ESOPHAGEAL STRICTURE 04/15/2007   s/p  dilitation  . GERD 09/21/2010  . 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 HIATAL HERNIA 04/15/2007  . HYPERCHOLESTEROLEMIA 07/03/2009  . Hypokalemia 11/30/2015  . Multiple myeloma ([facility])   . OSTEOPOROSIS 04/24/2007    ALLERGIES:  is allergic to atorvastatin; rosuvastatin; and sulfonamide derivatives.  MEDICATIONS:  Current Outpatient Prescriptions  Medication Sig Dispense Refill  . Ascorbic Acid (VITAMIN C) 1000 MG tablet Take 1,000 mg by mouth daily.     . Calcium 500-125 MG-UNIT TABS Take 1 tablet by mouth daily.      . cholecalciferol (VITAMIN D) 1000 UNITS tablet Take 1,000 Units by mouth daily.    . lansoprazole (PREVACID) 30 MG capsule Take 30 mg by mouth daily with breakfast.      No current facility-administered medications for this visit.     SURGICAL HISTORY:  Past Surgical History:  Procedure Laterality Date  . ESOPHAGOGASTRODUODENOSCOPY  04/15/2007  . TUBAL LIGATION      REVIEW OF SYSTEMS:  A comprehensive review of systems was negative.   PHYSICAL EXAMINATION: General appearance: alert, cooperative and no distress Head: Normocephalic, without obvious abnormality, atraumatic Neck: no adenopathy, no JVD, supple, symmetrical, trachea midline and thyroid not enlarged, symmetric, no tenderness/mass/nodules Lymph nodes: Cervical, supraclavicular, and axillary nodes normal. Resp: clear to auscultation bilaterally Back: symmetric, no curvature. ROM normal. No CVA tenderness. Cardio: regular rate and rhythm, S1, S2 normal, no murmur, click, rub or gallop GI: soft, non-tender; bowel sounds normal; no masses,  no organomegaly Extremities: extremities normal, atraumatic, no cyanosis or edema  ECOG PERFORMANCE STATUS: 0 - Asymptomatic  Blood pressure (!) 141/85, pulse 81, temperature 98.4 F (36.9 C), temperature source Oral, resp. rate 18, height 5' 6" (  1.676 m), weight 146 lb 12.8 oz (66.6 kg), SpO2 100 %.  LABORATORY DATA: Lab Results  Component Value Date   WBC 2.9 (L) 11/14/2016    HGB 12.3 11/14/2016   HCT 36.6 11/14/2016   MCV 90.2 11/14/2016   PLT 132 (L) 11/14/2016      Chemistry      Component Value Date/Time   NA 140 11/14/2016 0751   K 3.6 11/14/2016 0751   CL 104 05/09/2016 1000   CO2 22 11/14/2016 0751   BUN 12.1 11/14/2016 0751   CREATININE 1.0 11/14/2016 0751      Component Value Date/Time   CALCIUM 9.9 11/14/2016 0751   ALKPHOS 77 11/14/2016 0751   AST 29 11/14/2016 0751   ALT 30 11/14/2016 0751   BILITOT 1.07 11/14/2016 0751       RADIOGRAPHIC STUDIES: No results found.  ASSESSMENT AND PLAN:  This is a very pleasant 66 years old African-American female with multiple myeloma, IgA subtype. She underwent induction systemic chemotherapy with Carfilzomib, Cytoxan and dexamethasone followed by peripheral blood autologous stem cell transplant followed by 2 years of maintenance treatment with Revlimid. Her treatment was discontinued secondary to persistent chemotherapy-induced neutropenia. She is currently on observation and feeling fine. The recent myeloma panel showed no clear evidence for disease progression. I discussed the lab result with the patient today. I recommended for her to continue on observation with repeat myeloma panel in 3 months. I also discussed with the patient consideration of treatment with Zometa for her bone disease after we receive dental clearance. She was advised to call immediately if she has any concerning symptoms in the interval. The patient voices understanding of current disease status and treatment options and is in agreement with the current care plan. All questions were answered. The patient knows to call the clinic with any problems, questions or concerns. We can certainly see the patient much sooner if necessary. I spent 10 minutes counseling the patient face to face. The total time spent in the appointment was 15 minutes.   Disclaimer: This note was dictated with voice recognition software. Similar sounding  words can inadvertently be transcribed and may not be corrected upon review.       

## 2017-02-18 ENCOUNTER — Encounter: Payer: Self-pay | Admitting: Internal Medicine

## 2017-02-18 ENCOUNTER — Telehealth: Payer: Self-pay | Admitting: Internal Medicine

## 2017-02-18 ENCOUNTER — Ambulatory Visit (HOSPITAL_BASED_OUTPATIENT_CLINIC_OR_DEPARTMENT_OTHER): Payer: BLUE CROSS/BLUE SHIELD | Admitting: Internal Medicine

## 2017-02-18 ENCOUNTER — Other Ambulatory Visit (HOSPITAL_BASED_OUTPATIENT_CLINIC_OR_DEPARTMENT_OTHER): Payer: BLUE CROSS/BLUE SHIELD

## 2017-02-18 VITALS — BP 119/85 | HR 61 | Temp 98.0°F | Resp 18 | Ht 66.0 in | Wt 151.7 lb

## 2017-02-18 DIAGNOSIS — C9000 Multiple myeloma not having achieved remission: Secondary | ICD-10-CM

## 2017-02-18 LAB — COMPREHENSIVE METABOLIC PANEL
ALBUMIN: 3.8 g/dL (ref 3.5–5.0)
ALK PHOS: 58 U/L (ref 40–150)
ALT: 24 U/L (ref 0–55)
AST: 26 U/L (ref 5–34)
Anion Gap: 8 mEq/L (ref 3–11)
BUN: 13.7 mg/dL (ref 7.0–26.0)
CO2: 24 mEq/L (ref 22–29)
Calcium: 9.3 mg/dL (ref 8.4–10.4)
Chloride: 107 mEq/L (ref 98–109)
Creatinine: 1 mg/dL (ref 0.6–1.1)
EGFR: 70 mL/min/{1.73_m2} — AB (ref >90)
GLUCOSE: 93 mg/dL (ref 70–140)
POTASSIUM: 3.4 meq/L — AB (ref 3.5–5.1)
SODIUM: 139 meq/L (ref 136–145)
Total Bilirubin: 0.74 mg/dL (ref 0.20–1.20)
Total Protein: 6.6 g/dL (ref 6.4–8.3)

## 2017-02-18 LAB — CBC WITH DIFFERENTIAL/PLATELET
BASO%: 0.6 % (ref 0.0–2.0)
BASOS ABS: 0 10*3/uL (ref 0.0–0.1)
EOS ABS: 0 10*3/uL (ref 0.0–0.5)
EOS%: 0.9 % (ref 0.0–7.0)
HCT: 36 % (ref 34.8–46.6)
HGB: 12.2 g/dL (ref 11.6–15.9)
LYMPH%: 53.1 % — AB (ref 14.0–49.7)
MCH: 30.1 pg (ref 25.1–34.0)
MCHC: 33.9 g/dL (ref 31.5–36.0)
MCV: 89 fL (ref 79.5–101.0)
MONO#: 0.3 10*3/uL (ref 0.1–0.9)
MONO%: 8.5 % (ref 0.0–14.0)
NEUT#: 1.4 10*3/uL — ABNORMAL LOW (ref 1.5–6.5)
NEUT%: 36.9 % — AB (ref 38.4–76.8)
Platelets: 144 10*3/uL — ABNORMAL LOW (ref 145–400)
RBC: 4.05 10*6/uL (ref 3.70–5.45)
RDW: 12.7 % (ref 11.2–14.5)
WBC: 3.9 10*3/uL (ref 3.9–10.3)
lymph#: 2 10*3/uL (ref 0.9–3.3)

## 2017-02-18 LAB — LACTATE DEHYDROGENASE: LDH: 147 U/L (ref 125–245)

## 2017-02-18 NOTE — Telephone Encounter (Signed)
Gave patient AVS and calender per 5/22 los. Lab and f.u in 3 months.

## 2017-02-18 NOTE — Progress Notes (Signed)
[place] Telephone:([phone]   Fax:([phone]  OFFICE PROGRESS NOTE  [name], MD 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  PRIOR THERAPY: 1) Systemic chemotherapy with Carfilzomib, Cytoxan and dexamethasone. First dose on 11/15/2013. Status post 5 cycles. 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 3) Maintenance Revlimid 10 mg by mouth daily. Status post 2 months of treatment. 4) Maintenance Revlimid 15 mg by mouth daily. Status post 4 months of treatment. 5) Maintenance treatment with Revlimid 10 mg by mouth daily status post 17 months. She completed 2 years of treatment in February 2018.  CURRENT THERAPY: Observation.  INTERVAL HISTORY: Victoria Gates 66 y.o. female returns to the clinic today for follow-up visit accompanied by her husband. The patient is feeling fine today with no specific complaints. She denied having any chest pain, shortness of breath, cough or hemoptysis. She denied having any weight loss or night sweats. She has no nausea, vomiting, diarrhea or constipation. She has no fever or chills. She denied having any bleeding issues. She came to the clinic today for evaluation and repeat myeloma panel.  MEDICAL HISTORY: Past Medical History:  Diagnosis Date  . ALLERGIC RHINITIS CAUSE UNSPECIFIED 01/02/2009  . ANEMIA-IRON DEFICIENCY 04/24/2007  . Bradycardia 04/29/2016  . CERVICAL RADICULOPATHY, LEFT 02/01/2010  . Chest pain 03/2012   a. 04/15/2012 Ex MV: Ex time 10 mins, EF 63%, no ischemia/infarct.  Occas PAC's/PVC's.  . COLONIC POLYPS, HX OF 04/24/2007   hyperplastic only  . DM 03/18/2008  . Encounter for antineoplastic chemotherapy 04/29/2016  . ESOPHAGEAL STRICTURE 04/15/2007   s/p dilitation  . GERD 09/21/2010  . 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 HIATAL HERNIA 04/15/2007  . HYPERCHOLESTEROLEMIA 07/03/2009  . Hypokalemia 11/30/2015    . Multiple myeloma ([place])   . OSTEOPOROSIS 04/24/2007    ALLERGIES:  is allergic to atorvastatin; rosuvastatin; and sulfonamide derivatives.  MEDICATIONS:  Current Outpatient Prescriptions  Medication Sig Dispense Refill  . Ascorbic Acid (VITAMIN C) 1000 MG tablet Take 1,000 mg by mouth daily.     . Calcium 500-125 MG-UNIT TABS Take 1 tablet by mouth daily.      . cholecalciferol (VITAMIN D) 1000 UNITS tablet Take 1,000 Units by mouth daily.    . lansoprazole (PREVACID) 30 MG capsule Take 30 mg by mouth daily with breakfast.      No current facility-administered medications for this visit.     SURGICAL HISTORY:  Past Surgical History:  Procedure Laterality Date  . ESOPHAGOGASTRODUODENOSCOPY  04/15/2007  . TUBAL LIGATION      REVIEW OF SYSTEMS:  A comprehensive review of systems was negative.   PHYSICAL EXAMINATION: General appearance: alert, cooperative and no distress Head: Normocephalic, without obvious abnormality, atraumatic Neck: no adenopathy, no JVD, supple, symmetrical, trachea midline and thyroid not enlarged, symmetric, no tenderness/mass/nodules Lymph nodes: Cervical, supraclavicular, and axillary nodes normal. Resp: clear to auscultation bilaterally Back: symmetric, no curvature. ROM normal. No CVA tenderness. Cardio: regular rate and rhythm, S1, S2 normal, no murmur, click, rub or gallop GI: soft, non-tender; bowel sounds normal; no masses,  no organomegaly Extremities: extremities normal, atraumatic, no cyanosis or edema  ECOG PERFORMANCE STATUS: 0 - Asymptomatic  Blood pressure 119/85, pulse 61, temperature 98 F (36.7 C), temperature source Oral, resp. rate 18, height '5\' 6"'$  (1.676 m), weight 151 lb 11.2 oz (68.8 kg), SpO2 100 %.  LABORATORY DATA: Lab  Results  Component Value Date   WBC 3.9 02/18/2017   HGB 12.2 02/18/2017   HCT 36.0 02/18/2017   MCV 89.0 02/18/2017   PLT 144 (L) 02/18/2017      Chemistry      Component Value Date/Time   NA 140  11/14/2016 0751   K 3.6 11/14/2016 0751   CL 104 05/09/2016 1000   CO2 22 11/14/2016 0751   BUN 12.1 11/14/2016 0751   CREATININE 1.0 11/14/2016 0751      Component Value Date/Time   CALCIUM 9.9 11/14/2016 0751   ALKPHOS 77 11/14/2016 0751   AST 29 11/14/2016 0751   ALT 30 11/14/2016 0751   BILITOT 1.07 11/14/2016 0751       RADIOGRAPHIC STUDIES: No results found.  ASSESSMENT AND PLAN:  This is a very pleasant 65 years old African-American female with multiple myeloma, IgA subtype status post induction systemic chemotherapy with Carfilzomib, Cytoxan and dexamethasone followed by peripheral blood autologous stem cell transplant followed by 2 years maintenance of treatment with Revlimid. The patient is currently on observation and she is feeling fine. She had repeat myeloma panel performed earlier today. The results are still pending. Her CBC is unremarkable except for mild neutropenia and thrombocytopenia. I recommended for the patient to continue on observation with repeat myeloma panel in 3 months. She was advised to call immediately if she has any concerning symptoms in the interval. The patient voices understanding of current disease status and treatment options and is in agreement with the current care plan. All questions were answered. The patient knows to call the clinic with any problems, questions or concerns. We can certainly see the patient much sooner if necessary. I spent 10 minutes counseling the patient face to face. The total time spent in the appointment was 15 minutes.   Disclaimer: This note was dictated with voice recognition software. Similar sounding words can inadvertently be transcribed and may not be corrected upon review.

## 2017-02-19 LAB — IGG, IGA, IGM
IGM (IMMUNOGLOBIN M), SRM: 13 mg/dL — AB (ref 26–217)
IgA, Qn, Serum: 311 mg/dL (ref 87–352)
IgG, Qn, Serum: 1011 mg/dL (ref 700–1600)

## 2017-02-19 LAB — KAPPA/LAMBDA LIGHT CHAINS
Ig Kappa Free Light Chain: 13.6 mg/L (ref 3.3–19.4)
Ig Lambda Free Light Chain: 15 mg/L (ref 5.7–26.3)
Kappa/Lambda FluidC Ratio: 0.91 (ref 0.26–1.65)

## 2017-02-19 LAB — BETA 2 MICROGLOBULIN, SERUM: BETA 2: 1.7 mg/L (ref 0.6–2.4)

## 2017-05-14 ENCOUNTER — Other Ambulatory Visit (HOSPITAL_BASED_OUTPATIENT_CLINIC_OR_DEPARTMENT_OTHER): Payer: BLUE CROSS/BLUE SHIELD

## 2017-05-14 DIAGNOSIS — C9000 Multiple myeloma not having achieved remission: Secondary | ICD-10-CM | POA: Diagnosis not present

## 2017-05-14 LAB — COMPREHENSIVE METABOLIC PANEL
ALT: 21 U/L (ref 0–55)
AST: 23 U/L (ref 5–34)
Albumin: 3.9 g/dL (ref 3.5–5.0)
Alkaline Phosphatase: 66 U/L (ref 40–150)
Anion Gap: 9 mEq/L (ref 3–11)
BILIRUBIN TOTAL: 0.82 mg/dL (ref 0.20–1.20)
BUN: 13.5 mg/dL (ref 7.0–26.0)
CALCIUM: 9.9 mg/dL (ref 8.4–10.4)
CO2: 25 mEq/L (ref 22–29)
Chloride: 106 mEq/L (ref 98–109)
Creatinine: 1.1 mg/dL (ref 0.6–1.1)
EGFR: 59 mL/min/{1.73_m2} — AB (ref >90)
Glucose: 89 mg/dl (ref 70–140)
POTASSIUM: 3.7 meq/L (ref 3.5–5.1)
Sodium: 140 mEq/L (ref 136–145)
TOTAL PROTEIN: 7.3 g/dL (ref 6.4–8.3)

## 2017-05-14 LAB — CBC WITH DIFFERENTIAL/PLATELET
BASO%: 0.7 % (ref 0.0–2.0)
BASOS ABS: 0 10*3/uL (ref 0.0–0.1)
EOS ABS: 0 10*3/uL (ref 0.0–0.5)
EOS%: 1.2 % (ref 0.0–7.0)
HEMATOCRIT: 41.4 % (ref 34.8–46.6)
HEMOGLOBIN: 13.8 g/dL (ref 11.6–15.9)
LYMPH#: 1.9 10*3/uL (ref 0.9–3.3)
LYMPH%: 50.9 % — ABNORMAL HIGH (ref 14.0–49.7)
MCH: 29.3 pg (ref 25.1–34.0)
MCHC: 33.4 g/dL (ref 31.5–36.0)
MCV: 87.8 fL (ref 79.5–101.0)
MONO#: 0.3 10*3/uL (ref 0.1–0.9)
MONO%: 9.2 % (ref 0.0–14.0)
NEUT#: 1.4 10*3/uL — ABNORMAL LOW (ref 1.5–6.5)
NEUT%: 38 % — ABNORMAL LOW (ref 38.4–76.8)
PLATELETS: 169 10*3/uL (ref 145–400)
RBC: 4.72 10*6/uL (ref 3.70–5.45)
RDW: 13.4 % (ref 11.2–14.5)
WBC: 3.7 10*3/uL — ABNORMAL LOW (ref 3.9–10.3)

## 2017-05-14 LAB — LACTATE DEHYDROGENASE: LDH: 154 U/L (ref 125–245)

## 2017-05-15 LAB — IGG, IGA, IGM
IgA, Qn, Serum: 325 mg/dL (ref 87–352)
IgM, Qn, Serum: 21 mg/dL — ABNORMAL LOW (ref 26–217)

## 2017-05-15 LAB — KAPPA/LAMBDA LIGHT CHAINS
IG KAPPA FREE LIGHT CHAIN: 13.6 mg/L (ref 3.3–19.4)
IG LAMBDA FREE LIGHT CHAIN: 14.5 mg/L (ref 5.7–26.3)
Kappa/Lambda FluidC Ratio: 0.94 (ref 0.26–1.65)

## 2017-05-15 LAB — BETA 2 MICROGLOBULIN, SERUM: BETA 2: 1.7 mg/L (ref 0.6–2.4)

## 2017-05-21 ENCOUNTER — Ambulatory Visit (HOSPITAL_BASED_OUTPATIENT_CLINIC_OR_DEPARTMENT_OTHER): Payer: BLUE CROSS/BLUE SHIELD | Admitting: Internal Medicine

## 2017-05-21 ENCOUNTER — Telehealth: Payer: Self-pay | Admitting: Internal Medicine

## 2017-05-21 ENCOUNTER — Encounter: Payer: Self-pay | Admitting: Internal Medicine

## 2017-05-21 VITALS — BP 122/79 | HR 65 | Temp 98.0°F | Resp 18 | Ht 66.0 in | Wt 161.0 lb

## 2017-05-21 DIAGNOSIS — C9000 Multiple myeloma not having achieved remission: Secondary | ICD-10-CM | POA: Diagnosis not present

## 2017-05-21 NOTE — Telephone Encounter (Signed)
Gave pt avs and calendar for upcoming appts.  °

## 2017-05-21 NOTE — Progress Notes (Signed)
[place] Telephone:([phone]   Fax:(336) [phone]  OFFICE PROGRESS NOTE  [name], MD 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  PRIOR THERAPY: 1) Systemic chemotherapy with Carfilzomib, Cytoxan and dexamethasone. First dose on 11/15/2013. Status post 5 cycles. 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 3) Maintenance Revlimid 10 mg by mouth daily. Status post 2 months of treatment. 4) Maintenance Revlimid 15 mg by mouth daily. Status post 4 months of treatment. 5) Maintenance treatment with Revlimid 10 mg by mouth daily status post 17 months. She completed 2 years of treatment in February 2018.  CURRENT THERAPY: Observation.  INTERVAL HISTORY: Victoria Gates 66 y.o. female returns to the clinic today for follow-up visit. The patient is feeling fine today with no specific complaints. She work full-time at [employer] in [place] last week. She denied having any chest pain, shortness of breath, cough or hemoptysis. She denied having any fever or chills. She has no nausea, vomiting, diarrhea or constipation. She denied having any bleeding issues. She is here today for evaluation with repeat myeloma panel.  MEDICAL HISTORY: Past Medical History:  Diagnosis Date  . ALLERGIC RHINITIS CAUSE UNSPECIFIED 01/02/2009  . ANEMIA-IRON DEFICIENCY 04/24/2007  . Bradycardia 04/29/2016  . CERVICAL RADICULOPATHY, LEFT 02/01/2010  . Chest pain 03/2012   a. 04/15/2012 Ex MV: Ex time 10 mins, EF 63%, no ischemia/infarct.  Occas PAC's/PVC's.  . COLONIC POLYPS, HX OF 04/24/2007   hyperplastic only  . DM 03/18/2008  . Encounter for antineoplastic chemotherapy 04/29/2016  . ESOPHAGEAL STRICTURE 04/15/2007   s/p dilitation  . GERD 09/21/2010  . 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 HIATAL HERNIA 04/15/2007  . HYPERCHOLESTEROLEMIA 07/03/2009  . Hypokalemia 11/30/2015  . Multiple  myeloma ([place])   . OSTEOPOROSIS 04/24/2007    ALLERGIES:  is allergic to atorvastatin; rosuvastatin; and sulfonamide derivatives.  MEDICATIONS:  Current Outpatient Prescriptions  Medication Sig Dispense Refill  . Ascorbic Acid (VITAMIN C) 1000 MG tablet Take 1,000 mg by mouth daily.     . Calcium 500-125 MG-UNIT TABS Take 1 tablet by mouth daily.      . cholecalciferol (VITAMIN D) 1000 UNITS tablet Take 1,000 Units by mouth daily.    . lansoprazole (PREVACID) 30 MG capsule Take 30 mg by mouth daily with breakfast.      No current facility-administered medications for this visit.     SURGICAL HISTORY:  Past Surgical History:  Procedure Laterality Date  . ESOPHAGOGASTRODUODENOSCOPY  04/15/2007  . TUBAL LIGATION      REVIEW OF SYSTEMS:  A comprehensive review of systems was negative.   PHYSICAL EXAMINATION: General appearance: alert, cooperative and no distress Head: Normocephalic, without obvious abnormality, atraumatic Neck: no adenopathy, no JVD, supple, symmetrical, trachea midline and thyroid not enlarged, symmetric, no tenderness/mass/nodules Lymph nodes: Cervical, supraclavicular, and axillary nodes normal. Resp: clear to auscultation bilaterally Back: symmetric, no curvature. ROM normal. No CVA tenderness. Cardio: regular rate and rhythm, S1, S2 normal, no murmur, click, rub or gallop GI: soft, non-tender; bowel sounds normal; no masses,  no organomegaly Extremities: extremities normal, atraumatic, no cyanosis or edema  ECOG PERFORMANCE STATUS: 0 - Asymptomatic  Blood pressure 122/79, pulse 65, temperature 98 F (36.7 C), temperature source Oral, resp. rate 18, height _0  (1.676 m), weight 161 lb (73 kg), SpO2 99 %.  LABORATORY DATA: Lab Results  Component Value Date  WBC 3.7 (L) 05/14/2017   HGB 13.8 05/14/2017   HCT 41.4 05/14/2017   MCV 87.8 05/14/2017   PLT 169 05/14/2017      Chemistry      Component Value Date/Time   NA 140 05/14/2017 0750   K 3.7  05/14/2017 0750   CL 104 05/09/2016 1000   CO2 25 05/14/2017 0750   BUN 13.5 05/14/2017 0750   CREATININE 1.1 05/14/2017 0750      Component Value Date/Time   CALCIUM 9.9 05/14/2017 0750   ALKPHOS 66 05/14/2017 0750   AST 23 05/14/2017 0750   ALT 21 05/14/2017 0750   BILITOT 0.82 05/14/2017 0750       RADIOGRAPHIC STUDIES: No results found.  ASSESSMENT AND PLAN:  This is a very pleasant 66 years old African-American female with multiple myeloma, IgA subtype status post induction systemic chemotherapy with Carfilzomib, Cytoxan and dexamethasone followed by peripheral blood autologous stem cell transplant followed by 2 years maintenance of treatment with Revlimid. The patient is feeling very well today. Her myeloma panel is unremarkable. I recommended for the patient to continue on observation with repeat myeloma panel in 3 months. She was advised to call immediately if she has any concerning symptoms in the interval. The patient voices understanding of current disease status and treatment options and is in agreement with the current care plan. All questions were answered. The patient knows to call the clinic with any problems, questions or concerns. We can certainly see the patient much sooner if necessary. I spent 10 minutes counseling the patient face to face. The total time spent in the appointment was 15 minutes.   Disclaimer: This note was dictated with voice recognition software. Similar sounding words can inadvertently be transcribed and may not be corrected upon review.

## 2017-08-11 ENCOUNTER — Telehealth: Payer: Self-pay | Admitting: Internal Medicine

## 2017-08-11 NOTE — Telephone Encounter (Signed)
Spoke with patient and gave patient avs from last appointments.

## 2017-08-13 ENCOUNTER — Encounter: Payer: Self-pay | Admitting: Internal Medicine

## 2017-08-13 ENCOUNTER — Other Ambulatory Visit (HOSPITAL_BASED_OUTPATIENT_CLINIC_OR_DEPARTMENT_OTHER): Payer: BLUE CROSS/BLUE SHIELD

## 2017-08-13 ENCOUNTER — Telehealth: Payer: Self-pay | Admitting: Internal Medicine

## 2017-08-13 ENCOUNTER — Ambulatory Visit (HOSPITAL_BASED_OUTPATIENT_CLINIC_OR_DEPARTMENT_OTHER): Payer: BLUE CROSS/BLUE SHIELD | Admitting: Internal Medicine

## 2017-08-13 VITALS — BP 140/80 | HR 72 | Temp 98.6°F | Resp 18 | Ht 66.0 in | Wt 167.1 lb

## 2017-08-13 DIAGNOSIS — C9000 Multiple myeloma not having achieved remission: Secondary | ICD-10-CM

## 2017-08-13 LAB — COMPREHENSIVE METABOLIC PANEL
ALBUMIN: 3.8 g/dL (ref 3.5–5.0)
ALK PHOS: 59 U/L (ref 40–150)
ALT: 17 U/L (ref 0–55)
AST: 22 U/L (ref 5–34)
Anion Gap: 7 mEq/L (ref 3–11)
BILIRUBIN TOTAL: 0.8 mg/dL (ref 0.20–1.20)
BUN: 9 mg/dL (ref 7.0–26.0)
CALCIUM: 9.2 mg/dL (ref 8.4–10.4)
CO2: 26 mEq/L (ref 22–29)
CREATININE: 1 mg/dL (ref 0.6–1.1)
Chloride: 106 mEq/L (ref 98–109)
EGFR: >60 mL/min/{1.73_m2} (ref >60)
GLUCOSE: 92 mg/dL (ref 70–140)
Potassium: 3.2 mEq/L — ABNORMAL LOW (ref 3.5–5.1)
SODIUM: 139 meq/L (ref 136–145)
TOTAL PROTEIN: 7.4 g/dL (ref 6.4–8.3)

## 2017-08-13 LAB — CBC WITH DIFFERENTIAL/PLATELET
BASO%: 0.9 % (ref 0.0–2.0)
Basophils Absolute: 0 10*3/uL (ref 0.0–0.1)
EOS%: 2.6 % (ref 0.0–7.0)
Eosinophils Absolute: 0.1 10*3/uL (ref 0.0–0.5)
HCT: 37.4 % (ref 34.8–46.6)
HGB: 12.5 g/dL (ref 11.6–15.9)
LYMPH%: 45.2 % (ref 14.0–49.7)
MCH: 29.1 pg (ref 25.1–34.0)
MCHC: 33.4 g/dL (ref 31.5–36.0)
MCV: 87 fL (ref 79.5–101.0)
MONO#: 0.4 10*3/uL (ref 0.1–0.9)
MONO%: 8.3 % (ref 0.0–14.0)
NEUT%: 43 % (ref 38.4–76.8)
NEUTROS ABS: 2 10*3/uL (ref 1.5–6.5)
PLATELETS: 168 10*3/uL (ref 145–400)
RBC: 4.29 10*6/uL (ref 3.70–5.45)
RDW: 13.6 % (ref 11.2–14.5)
WBC: 4.7 10*3/uL (ref 3.9–10.3)
lymph#: 2.1 10*3/uL (ref 0.9–3.3)

## 2017-08-13 LAB — LACTATE DEHYDROGENASE: LDH: 165 U/L (ref 125–245)

## 2017-08-13 NOTE — Telephone Encounter (Signed)
Scheduled appt per 11/14 los - Gave patient AVS and calender per los.  

## 2017-08-13 NOTE — Progress Notes (Signed)
[place] Telephone:(336) [phone]   Fax:(336) [phone]  OFFICE PROGRESS NOTE  [name], MD 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  PRIOR THERAPY: 1) Systemic chemotherapy with Carfilzomib, Cytoxan and dexamethasone. First dose on 11/15/2013. Status post 5 cycles. 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 3) Maintenance Revlimid 10 mg by mouth daily. Status post 2 months of treatment. 4) Maintenance Revlimid 15 mg by mouth daily. Status post 4 months of treatment. 5) Maintenance treatment with Revlimid 10 mg by mouth daily status post 17 months. She completed 2 years of treatment in February 2018.  CURRENT THERAPY: Observation.  INTERVAL HISTORY: Victoria Gates 66 y.o. female returns to the clinic today for follow-up visit accompanied by her husband.  The patient is feeling fine today with no specific complaints.  She denied having any chest pain, shortness breath, cough or hemoptysis.  She had recent cold with mild pain in the left ear.  She denied having any weight loss or night sweats.  She has no nausea, vomiting, diarrhea or constipation.  She is currently on observation.  She had repeat myeloma panel performed earlier today and she is here for evaluation and discussion of her lab results.  MEDICAL HISTORY: Past Medical History:  Diagnosis Date  . ALLERGIC RHINITIS CAUSE UNSPECIFIED 01/02/2009  . ANEMIA-IRON DEFICIENCY 04/24/2007  . Bradycardia 04/29/2016  . CERVICAL RADICULOPATHY, LEFT 02/01/2010  . Chest pain 03/2012   a. 04/15/2012 Ex MV: Ex time 10 mins, EF 63%, no ischemia/infarct.  Occas PAC's/PVC's.  . COLONIC POLYPS, HX OF 04/24/2007   hyperplastic only  . DM 03/18/2008  . Encounter for antineoplastic chemotherapy 04/29/2016  . ESOPHAGEAL STRICTURE 04/15/2007   s/p dilitation  . GERD 09/21/2010  . 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 HIATAL HERNIA  04/15/2007  . HYPERCHOLESTEROLEMIA 07/03/2009  . Hypokalemia 11/30/2015  . Multiple myeloma ([place])   . OSTEOPOROSIS 04/24/2007    ALLERGIES:  is allergic to atorvastatin; rosuvastatin; and sulfonamide derivatives.  MEDICATIONS:  Current Outpatient Medications  Medication Sig Dispense Refill  . Ascorbic Acid (VITAMIN C) 1000 MG tablet Take 1,000 mg by mouth daily.     . Calcium 500-125 MG-UNIT TABS Take 1 tablet by mouth daily.      . cholecalciferol (VITAMIN D) 1000 UNITS tablet Take 1,000 Units by mouth daily.    . lansoprazole (PREVACID) 30 MG capsule Take 30 mg by mouth daily with breakfast.      No current facility-administered medications for this visit.     SURGICAL HISTORY:  Past Surgical History:  Procedure Laterality Date  . ESOPHAGOGASTRODUODENOSCOPY  04/15/2007  . TUBAL LIGATION      REVIEW OF SYSTEMS:  A comprehensive review of systems was negative except for: Ears, nose, mouth, throat, and face: positive for earaches   PHYSICAL EXAMINATION: General appearance: alert, cooperative and no distress Head: Normocephalic, without obvious abnormality, atraumatic Neck: no adenopathy, no JVD, supple, symmetrical, trachea midline and thyroid not enlarged, symmetric, no tenderness/mass/nodules Lymph nodes: Cervical, supraclavicular, and axillary nodes normal. Resp: clear to auscultation bilaterally Back: symmetric, no curvature. ROM normal. No CVA tenderness. Cardio: regular rate and rhythm, S1, S2 normal, no murmur, click, rub or gallop GI: soft, non-tender; bowel sounds normal; no masses,  no organomegaly Extremities: extremities normal, atraumatic, no cyanosis or edema  ECOG PERFORMANCE STATUS: 0 - Asymptomatic  Blood pressure 140/80, pulse 72, temperature 98.6  F (37 C), temperature source Oral, resp. rate 18, height _0  (1.676 m), weight 167 lb 1.6 oz (75.8 kg), SpO2 100 %.  LABORATORY DATA: Lab Results  Component Value Date   WBC 4.7 08/13/2017   HGB 12.5 08/13/2017     HCT 37.4 08/13/2017   MCV 87.0 08/13/2017   PLT 168 08/13/2017      Chemistry      Component Value Date/Time   NA 140 05/14/2017 0750   K 3.7 05/14/2017 0750   CL 104 05/09/2016 1000   CO2 25 05/14/2017 0750   BUN 13.5 05/14/2017 0750   CREATININE 1.1 05/14/2017 0750      Component Value Date/Time   CALCIUM 9.9 05/14/2017 0750   ALKPHOS 66 05/14/2017 0750   AST 23 05/14/2017 0750   ALT 21 05/14/2017 0750   BILITOT 0.82 05/14/2017 0750       RADIOGRAPHIC STUDIES: No results found.  ASSESSMENT AND PLAN:  This is a very pleasant 66 years old African-American female with multiple myeloma, IgA subtype status post induction systemic chemotherapy with Carfilzomib, Cytoxan and dexamethasone followed by peripheral blood autologous stem cell transplant followed by 2 years maintenance of treatment with Revlimid. The patient is currently on observation and she is feeling fine. CBC performed earlier today is completely normal.  The remaining myeloma panel is still pending. I discussed the lab results with the patient and her husband and recommended for her to continue on observation with repeat myeloma panel in 3 months.  If there is any concerning abnormality on the pending blood work I would call the patient for an earlier appointment. She was advised to call immediately if she has any concerning symptoms in the interval. The patient voices understanding of current disease status and treatment options and is in agreement with the current care plan. All questions were answered. The patient knows to call the clinic with any problems, questions or concerns. We can certainly see the patient much sooner if necessary. I spent 10 minutes counseling the patient face to face. The total time spent in the appointment was 15 minutes.   Disclaimer: This note was dictated with voice recognition software. Similar sounding words can inadvertently be transcribed and may not be corrected upon  review.

## 2017-08-14 LAB — KAPPA/LAMBDA LIGHT CHAINS
IG KAPPA FREE LIGHT CHAIN: 15.2 mg/L (ref 3.3–19.4)
IG LAMBDA FREE LIGHT CHAIN: 14.8 mg/L (ref 5.7–26.3)
KAPPA/LAMBDA FLC RATIO: 1.03 (ref 0.26–1.65)

## 2017-08-14 LAB — BETA 2 MICROGLOBULIN, SERUM: BETA 2: 1.7 mg/L (ref 0.6–2.4)

## 2017-08-18 LAB — IMMUNOFIXATION ELECTROPHORESIS
IgA, Qn, Serum: 295 mg/dL (ref 87–352)
IgM, Qn, Serum: 27 mg/dL (ref 26–217)
TOTAL PROTEIN: 6.8 g/dL (ref 6.0–8.5)

## 2017-08-20 ENCOUNTER — Ambulatory Visit: Payer: 59 | Admitting: Endocrinology

## 2017-09-01 ENCOUNTER — Encounter: Payer: Self-pay | Admitting: Endocrinology

## 2017-09-01 ENCOUNTER — Ambulatory Visit (INDEPENDENT_AMBULATORY_CARE_PROVIDER_SITE_OTHER): Payer: BLUE CROSS/BLUE SHIELD | Admitting: Endocrinology

## 2017-09-01 VITALS — BP 120/72 | HR 63 | Wt 168.0 lb

## 2017-09-01 DIAGNOSIS — Z0000 Encounter for general adult medical examination without abnormal findings: Secondary | ICD-10-CM

## 2017-09-01 DIAGNOSIS — E119 Type 2 diabetes mellitus without complications: Secondary | ICD-10-CM | POA: Diagnosis not present

## 2017-09-01 DIAGNOSIS — M810 Age-related osteoporosis without current pathological fracture: Secondary | ICD-10-CM | POA: Diagnosis not present

## 2017-09-01 DIAGNOSIS — Z23 Encounter for immunization: Secondary | ICD-10-CM

## 2017-09-01 LAB — LIPID PANEL
Cholesterol: 225 mg/dL — ABNORMAL HIGH (ref 0–200)
HDL: 38.9 mg/dL — ABNORMAL LOW (ref >39.00)
LDL Cholesterol: 151 mg/dL — ABNORMAL HIGH (ref 0–99)
NONHDL: 185.89
TRIGLYCERIDES: 176 mg/dL — AB (ref 0.0–149.0)
Total CHOL/HDL Ratio: 6
VLDL: 35.2 mg/dL (ref 0.0–40.0)

## 2017-09-01 LAB — URINALYSIS, ROUTINE W REFLEX MICROSCOPIC
Bilirubin Urine: NEGATIVE
Hgb urine dipstick: NEGATIVE
Ketones, ur: NEGATIVE
LEUKOCYTES UA: NEGATIVE
NITRITE: NEGATIVE
PH: 6 (ref 5.0–8.0)
RBC / HPF: NONE SEEN (ref 0–?)
Specific Gravity, Urine: <=1.005 — AB (ref 1.000–1.030)
TOTAL PROTEIN, URINE-UPE24: NEGATIVE
URINE GLUCOSE: NEGATIVE
UROBILINOGEN UA: 0.2 (ref 0.0–1.0)
WBC, UA: NONE SEEN (ref 0–?)

## 2017-09-01 LAB — VITAMIN D 25 HYDROXY (VIT D DEFICIENCY, FRACTURES): VITD: 37.3 ng/mL (ref 30.00–100.00)

## 2017-09-01 LAB — POCT GLYCOSYLATED HEMOGLOBIN (HGB A1C): HEMOGLOBIN A1C: 5.3

## 2017-09-01 LAB — TSH: TSH: 4.17 u[IU]/mL (ref 0.35–4.50)

## 2017-09-01 NOTE — Progress Notes (Signed)
we discussed code status.  pt requests full code, but would not want to be started or maintained on artificial life-support measures if there was not a reasonable chance of recovery 

## 2017-09-01 NOTE — Progress Notes (Signed)
Subjective:    Patient ID: Victoria Gates, female    DOB: 07/07/1951, 66 y.o.   MRN: [id number]  HPI Pt is here for regular wellness examination, and is feeling pretty well in general, and says chronic med probs are stable, except as noted below Past Medical History:  Diagnosis Date  . ALLERGIC RHINITIS CAUSE UNSPECIFIED 01/02/2009  . ANEMIA-IRON DEFICIENCY 04/24/2007  . Bradycardia 04/29/2016  . CERVICAL RADICULOPATHY, LEFT 02/01/2010  . Chest pain 03/2012   a. 04/15/2012 Ex MV: Ex time 10 mins, EF 63%, no ischemia/infarct.  Occas PAC's/PVC's.  . COLONIC POLYPS, HX OF 04/24/2007   hyperplastic only  . DM 03/18/2008  . Encounter for antineoplastic chemotherapy 04/29/2016  . ESOPHAGEAL STRICTURE 04/15/2007   s/p dilitation  . GERD 09/21/2010  . 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 HIATAL HERNIA 04/15/2007  . HYPERCHOLESTEROLEMIA 07/03/2009  . Hypokalemia 11/30/2015  . Multiple myeloma ([place])   . OSTEOPOROSIS 04/24/2007    Past Surgical History:  Procedure Laterality Date  . ESOPHAGOGASTRODUODENOSCOPY  04/15/2007  . TUBAL LIGATION      Social History   Socioeconomic History  . Marital status: Married    Spouse name: Not on file  . Number of children: Not on file  . Years of education: Not on file  . Highest education level: Not on file  Social Needs  . Financial resource strain: Not on file  . Food insecurity - worry: Not on file  . Food insecurity - inability: Not on file  . Transportation needs - medical: Not on file  . Transportation needs - non-medical: Not on file  Occupational History  . Occupation: MAIL Armed forces operational officer: [employer]  Tobacco Use  . Smoking status: Never Smoker  . Smokeless tobacco: Never Used  Substance and Sexual Activity  . Alcohol use: No  . Drug use: No  . Sexual activity: Yes  Other Topics Concern  . Not on file  Social History Narrative   Lives in [place] with spouse.   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    Current Outpatient  Medications on File Prior to Visit  Medication Sig Dispense Refill  . Ascorbic Acid (VITAMIN C) 1000 MG tablet Take 1,000 mg by mouth daily.     . Calcium 500-125 MG-UNIT TABS Take 1 tablet by mouth daily.      . cholecalciferol (VITAMIN D) 1000 UNITS tablet Take 1,000 Units by mouth daily.    . lansoprazole (PREVACID) 30 MG capsule Take 30 mg by mouth daily with breakfast.      No current facility-administered medications on file prior to visit.     Allergies  Allergen Reactions  . Atorvastatin     REACTION: myalgias  . Rosuvastatin     REACTION: myalgias  . Sulfonamide Derivatives     Unknown reaction     Family History  Problem Relation Age of Onset  . Brain cancer Mother   . Heart disease Mother   . Lung cancer Father   . Heart disease Father   . Diabetes Sister   . Heart disease Sister   . Diabetes Brother   . Heart disease Brother   . Cancer Neg Hx        No FH of Colon Cancer  . Colon cancer Neg Hx   . Esophageal cancer Neg Hx   . Rectal cancer Neg Hx   . Stomach cancer Neg Hx     BP 120/72 (BP Location: Left Arm,  Patient Position: Sitting, Cuff Size: Normal)   Pulse 63   Wt 168 lb (76.2 kg)   SpO2 98%   BMI 27.12 kg/m     Review of Systems Denies fever, fatigue, visual loss, hearing loss, chest pain, sob, back pain, depression, cold intolerance, BRBPR, hematuria, syncope, numbness, allergy sxs, easy bruising, and rash.     Objective:   Physical Exam VS: see vs page GEN: no distress HEAD: head: no deformity eyes: no periorbital swelling, no proptosis external nose and ears are normal mouth: no lesion seen NECK: supple, thyroid is not enlarged CHEST WALL: no deformity LUNGS:  Clear to auscultation BREASTS: sees gyn CV: reg rate and rhythm, no murmur ABD: abdomen is soft, nontender.  no hepatosplenomegaly.  not distended.  no hernia GENITALIA/RECTAL: sees gyn MUSCULOSKELETAL: muscle bulk and strength are grossly normal.  no obvious joint swelling.   gait is normal and steady EXTEMITIES: no deformity.  no ulcer on the feet.  feet are of normal color and temp.  no edema PULSES: dorsalis pedis intact bilat.  no carotid bruit NEURO:  cn 2-12 grossly intact.   readily moves all 4's.  sensation is intact to touch on the feet SKIN:  Normal texture and temperature.  No rash or suspicious lesion is visible.   NODES:  None palpable at the neck PSYCH: alert, well-oriented.  Does not appear anxious nor depressed.  I personally reviewed electrocardiogram tracing (today): Indication: DM Impression: NSR.  No MI.  No hypertrophy. Compared to 2017: no significant change.       Assessment & Plan:  Wellness visit today, with problems stable, except as noted.    Subjective:   Patient here for Medicare annual wellness visit and management of other chronic and acute problems.     Risk factors: advanced age    63 of Physicians Providing Medical Care to Patient:  See "snapshot"   Activities of Daily Living: In your present state of health, do you have any difficulty performing the following activities (lives with husband)?:  Preparing food and eating?: No  Bathing yourself: No  Getting dressed: No  Using the toilet:No  Moving around from place to place: No  In the past year have you fallen or had a near fall?: No    Home Safety: Has smoke detector and wears seat belts. Firearms are safely stored.   Opioid Use: none   Diet and Exercise  Current exercise habits: pt says good Dietary issues discussed: pt reports a healthy diet   Depression Screen  Q1: Over the past two weeks, have you felt down, depressed or hopeless? no  Q2: Over the past two weeks, have you felt little interest or pleasure in doing things? no   The following portions of the patient's history were reviewed and updated as appropriate: allergies, current medications, past family history, past medical history, past social history, past surgical history and problem list.     Review of Systems  Denies hearing loss, and visual loss Objective:   Vision:  Advertising account executive, so he declines VA today.  Hearing: grossly normal Body mass index:  See vs page Msk: pt easily and quickly performs "get-up-and-go" from a sitting position Cognitive Impairment Assessment: cognition, memory and judgment appear normal.  remembers 2/3 at 5 minutes (? effort).  excellent recall.  can easily read and write a sentence.  alert and oriented x 3   Assessment:   Medicare wellness utd on preventive parameters    Plan:   During the  course of the visit the patient was educated and counseled about appropriate screening and preventive services including:        Fall prevention is advised today  Screening mammography is ordered Bone densitometry screening is UTD Diabetes screening is UTD Nutrition counseling is offered  advanced directives/end of life addressed today:  see healthcare directives hyperlink  Vaccines are updated as needed  Patient Instructions (the written plan) was given to the patient.

## 2017-09-01 NOTE — Patient Instructions (Addendum)
Please consider these measures for your health:  minimize alcohol.  Do not use tobacco products.  Have a colonoscopy at least every 10 years from age 66.  Women should have an annual mammogram from age 98.  Keep firearms safely stored.  Always use seat belts.  have working smoke alarms in your home.  See an eye doctor and dentist regularly.  Never drive under the influence of alcohol or drugs (including prescription drugs).   blood tests are requested for you today.  We'll let you know about the results.   good diet and exercise significantly improve the control of your diabetes.  please let me know if you wish to be referred to a dietician.  high blood sugar is very risky to your health.  you should see an eye doctor and dentist every year.  It is very important to get all recommended vaccinations.  Please come back for a follow-up appointment in 1 year.

## 2017-09-02 LAB — MICROALBUMIN / CREATININE URINE RATIO
Creatinine,U: 16.7 mg/dL
MICROALB/CREAT RATIO: 4.2 mg/g (ref 0.0–30.0)
Microalb, Ur: <0.7 mg/dL (ref 0.0–1.9)

## 2017-09-03 LAB — PTH, INTACT AND CALCIUM
CALCIUM: 9.7 mg/dL (ref 8.6–10.4)
PTH: 41 pg/mL (ref 14–64)

## 2017-10-02 ENCOUNTER — Ambulatory Visit
Admission: RE | Admit: 2017-10-02 | Discharge: 2017-10-02 | Disposition: A | Discharge status: Home / Self Care | Payer: BLUE CROSS/BLUE SHIELD | Source: Ambulatory Visit | Attending: Endocrinology | Admitting: Endocrinology

## 2017-10-02 DIAGNOSIS — Z0000 Encounter for general adult medical examination without abnormal findings: Secondary | ICD-10-CM

## 2017-11-06 ENCOUNTER — Inpatient Hospital Stay: Payer: BLUE CROSS/BLUE SHIELD | Attending: Internal Medicine

## 2017-11-06 DIAGNOSIS — K219 Gastro-esophageal reflux disease without esophagitis: Secondary | ICD-10-CM | POA: Insufficient documentation

## 2017-11-06 DIAGNOSIS — C9000 Multiple myeloma not having achieved remission: Secondary | ICD-10-CM | POA: Diagnosis not present

## 2017-11-06 DIAGNOSIS — E119 Type 2 diabetes mellitus without complications: Secondary | ICD-10-CM | POA: Insufficient documentation

## 2017-11-06 DIAGNOSIS — E876 Hypokalemia: Secondary | ICD-10-CM | POA: Diagnosis not present

## 2017-11-06 DIAGNOSIS — Z79899 Other long term (current) drug therapy: Secondary | ICD-10-CM | POA: Diagnosis not present

## 2017-11-06 DIAGNOSIS — Z9484 Stem cells transplant status: Secondary | ICD-10-CM | POA: Diagnosis not present

## 2017-11-06 DIAGNOSIS — K449 Diaphragmatic hernia without obstruction or gangrene: Secondary | ICD-10-CM | POA: Insufficient documentation

## 2017-11-06 DIAGNOSIS — E7800 Pure hypercholesterolemia, unspecified: Secondary | ICD-10-CM | POA: Diagnosis not present

## 2017-11-06 DIAGNOSIS — Z9221 Personal history of antineoplastic chemotherapy: Secondary | ICD-10-CM | POA: Diagnosis not present

## 2017-11-06 LAB — CBC WITH DIFFERENTIAL/PLATELET
Basophils Absolute: 0 10*3/uL (ref 0.0–0.1)
Basophils Relative: 1 %
Eosinophils Absolute: 0.1 10*3/uL (ref 0.0–0.5)
Eosinophils Relative: 3 %
HEMATOCRIT: 38 % (ref 34.8–46.6)
HEMOGLOBIN: 12.5 g/dL (ref 11.6–15.9)
LYMPHS ABS: 2.2 10*3/uL (ref 0.9–3.3)
Lymphocytes Relative: 55 %
MCH: 28.4 pg (ref 25.1–34.0)
MCHC: 32.9 g/dL (ref 31.5–36.0)
MCV: 86.4 fL (ref 79.5–101.0)
MONOS PCT: 8 %
Monocytes Absolute: 0.3 10*3/uL (ref 0.1–0.9)
NEUTROS ABS: 1.4 10*3/uL — AB (ref 1.5–6.5)
NEUTROS PCT: 35 %
Platelets: 194 10*3/uL (ref 145–400)
RBC: 4.4 MIL/uL (ref 3.70–5.45)
RDW: 13.5 % (ref 11.2–14.5)
WBC: 4 10*3/uL (ref 3.9–10.3)

## 2017-11-06 LAB — COMPREHENSIVE METABOLIC PANEL
ALT: 21 U/L (ref 0–55)
AST: 21 U/L (ref 5–34)
Albumin: 3.7 g/dL (ref 3.5–5.0)
Alkaline Phosphatase: 66 U/L (ref 40–150)
Anion gap: 11 (ref 3–11)
BUN: 12 mg/dL (ref 7–26)
CHLORIDE: 106 mmol/L (ref 98–109)
CO2: 22 mmol/L (ref 22–29)
Calcium: 9.2 mg/dL (ref 8.4–10.4)
Creatinine, Ser: 1.03 mg/dL (ref 0.60–1.10)
GFR calc non Af Amer: 55 mL/min — ABNORMAL LOW (ref >60)
Glucose, Bld: 104 mg/dL (ref 70–140)
Potassium: 3.4 mmol/L — ABNORMAL LOW (ref 3.5–5.1)
SODIUM: 139 mmol/L (ref 136–145)
Total Bilirubin: 0.7 mg/dL (ref 0.2–1.2)
Total Protein: 7.1 g/dL (ref 6.4–8.3)

## 2017-11-06 LAB — LACTATE DEHYDROGENASE: LDH: 146 U/L (ref 125–245)

## 2017-11-07 LAB — BETA 2 MICROGLOBULIN, SERUM: Beta-2 Microglobulin: 1.5 mg/L (ref 0.6–2.4)

## 2017-11-07 LAB — IGG, IGA, IGM
IGA: 327 mg/dL (ref 87–352)
IGM (IMMUNOGLOBULIN M), SRM: 27 mg/dL (ref 26–217)
IgG (Immunoglobin G), Serum: 1219 mg/dL (ref 700–1600)

## 2017-11-07 LAB — KAPPA/LAMBDA LIGHT CHAINS
Kappa free light chain: 16.2 mg/L (ref 3.3–19.4)
Kappa, lambda light chain ratio: 1.05 (ref 0.26–1.65)
LAMDA FREE LIGHT CHAINS: 15.4 mg/L (ref 5.7–26.3)

## 2017-11-13 ENCOUNTER — Encounter: Payer: Self-pay | Admitting: Internal Medicine

## 2017-11-13 ENCOUNTER — Telehealth: Payer: Self-pay

## 2017-11-13 ENCOUNTER — Inpatient Hospital Stay (HOSPITAL_BASED_OUTPATIENT_CLINIC_OR_DEPARTMENT_OTHER): Payer: BLUE CROSS/BLUE SHIELD | Admitting: Internal Medicine

## 2017-11-13 VITALS — BP 124/71 | HR 59 | Temp 97.8°F | Resp 20 | Ht 66.0 in | Wt 166.8 lb

## 2017-11-13 DIAGNOSIS — Z9221 Personal history of antineoplastic chemotherapy: Secondary | ICD-10-CM

## 2017-11-13 DIAGNOSIS — I10 Essential (primary) hypertension: Secondary | ICD-10-CM

## 2017-11-13 DIAGNOSIS — C9000 Multiple myeloma not having achieved remission: Secondary | ICD-10-CM | POA: Diagnosis not present

## 2017-11-13 DIAGNOSIS — Z9484 Stem cells transplant status: Secondary | ICD-10-CM | POA: Diagnosis not present

## 2017-11-13 NOTE — Telephone Encounter (Signed)
Printed patient avs and calender for upcoming appointment. Per 2/14 los

## 2017-11-13 NOTE — Progress Notes (Signed)
[place] Telephone:([phone]   Fax:(336) [phone]  OFFICE PROGRESS NOTE  [name], MD 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  PRIOR THERAPY: 1) Systemic chemotherapy with Carfilzomib, Cytoxan and dexamethasone. First dose on 11/15/2013. Status post 5 cycles. 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 3) Maintenance Revlimid 10 mg by mouth daily. Status post 2 months of treatment. 4) Maintenance Revlimid 15 mg by mouth daily. Status post 4 months of treatment. 5) Maintenance treatment with Revlimid 10 mg by mouth daily status post 17 months. She completed 2 years of treatment in February 2018.  CURRENT THERAPY: Observation.  INTERVAL HISTORY: Victoria Gates 67 y.o. female returns to the clinic today for follow-up visit accompanied by her husband.  The patient is feeling fine today with no specific complaints.  She denied having any chest pain, shortness of breath, cough or hemoptysis.  She denied having any recent weight loss or night sweats.  She has no recent infection.  She has been in observation and feeling well.  She had repeat myeloma panel performed recently and she is here for evaluation and discussion of her lab results.  MEDICAL HISTORY: Past Medical History:  Diagnosis Date  . ALLERGIC RHINITIS CAUSE UNSPECIFIED 01/02/2009  . ANEMIA-IRON DEFICIENCY 04/24/2007  . Bradycardia 04/29/2016  . CERVICAL RADICULOPATHY, LEFT 02/01/2010  . Chest pain 03/2012   a. 04/15/2012 Ex MV: Ex time 10 mins, EF 63%, no ischemia/infarct.  Occas PAC's/PVC's.  . COLONIC POLYPS, HX OF 04/24/2007   hyperplastic only  . DM 03/18/2008  . Encounter for antineoplastic chemotherapy 04/29/2016  . ESOPHAGEAL STRICTURE 04/15/2007   s/p dilitation  . GERD 09/21/2010  . 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 HIATAL HERNIA 04/15/2007  . HYPERCHOLESTEROLEMIA 07/03/2009  . Hypokalemia  11/30/2015  . Multiple myeloma ([place])   . OSTEOPOROSIS 04/24/2007    ALLERGIES:  is allergic to atorvastatin; rosuvastatin; and sulfonamide derivatives.  MEDICATIONS:  Current Outpatient Medications  Medication Sig Dispense Refill  . Ascorbic Acid (VITAMIN C) 1000 MG tablet Take 1,000 mg by mouth daily.     . Calcium 500-125 MG-UNIT TABS Take 1 tablet by mouth daily.      . cholecalciferol (VITAMIN D) 1000 UNITS tablet Take 1,000 Units by mouth daily.    . lansoprazole (PREVACID) 30 MG capsule Take 30 mg by mouth daily with breakfast.      No current facility-administered medications for this visit.     SURGICAL HISTORY:  Past Surgical History:  Procedure Laterality Date  . ESOPHAGOGASTRODUODENOSCOPY  04/15/2007  . TUBAL LIGATION      REVIEW OF SYSTEMS:  A comprehensive review of systems was negative.   PHYSICAL EXAMINATION: General appearance: alert, cooperative and no distress Head: Normocephalic, without obvious abnormality, atraumatic Neck: no adenopathy, no JVD, supple, symmetrical, trachea midline and thyroid not enlarged, symmetric, no tenderness/mass/nodules Lymph nodes: Cervical, supraclavicular, and axillary nodes normal. Resp: clear to auscultation bilaterally Back: symmetric, no curvature. ROM normal. No CVA tenderness. Cardio: regular rate and rhythm, S1, S2 normal, no murmur, click, rub or gallop GI: soft, non-tender; bowel sounds normal; no masses,  no organomegaly Extremities: extremities normal, atraumatic, no cyanosis or edema  ECOG PERFORMANCE STATUS: 0 - Asymptomatic  Blood pressure 124/71, pulse (!) 59, temperature 97.8 F (36.6 C), temperature source Oral, resp. rate 20, height '5\' 6"'$  (1.676 m), weight 166 lb 12.8 oz (75.7 kg),  SpO2 100 %.  LABORATORY DATA: Lab Results  Component Value Date   WBC 4.0 11/06/2017   HGB 12.5 11/06/2017   HCT 38.0 11/06/2017   MCV 86.4 11/06/2017   PLT 194 11/06/2017      Chemistry      Component Value Date/Time   NA  139 11/06/2017 0742   NA 139 08/13/2017 0816   K 3.4 (L) 11/06/2017 0742   K 3.2 (L) 08/13/2017 0816   CL 106 11/06/2017 0742   CO2 22 11/06/2017 0742   CO2 26 08/13/2017 0816   BUN 12 11/06/2017 0742   BUN 9.0 08/13/2017 0816   CREATININE 1.03 11/06/2017 0742   CREATININE 1.0 08/13/2017 0816      Component Value Date/Time   CALCIUM 9.2 11/06/2017 0742   CALCIUM 9.2 08/13/2017 0816   ALKPHOS 66 11/06/2017 0742   ALKPHOS 59 08/13/2017 0816   AST 21 11/06/2017 0742   AST 22 08/13/2017 0816   ALT 21 11/06/2017 0742   ALT 17 08/13/2017 0816   BILITOT 0.7 11/06/2017 0742   BILITOT 0.80 08/13/2017 0816       RADIOGRAPHIC STUDIES: No results found.  ASSESSMENT AND PLAN:  This is a very pleasant 67 years old African-American female with multiple myeloma, IgA subtype status post induction systemic chemotherapy with Carfilzomib, Cytoxan and dexamethasone followed by peripheral blood autologous stem cell transplant followed by 2 years maintenance of treatment with Revlimid. The patient is current on observation and she is feeling very well. Repeat myeloma panel performed recently showed no concerning findings for disease progression. I discussed the lab results with the patient and her husband and recommended for her to continue in observation with repeat myeloma panel in 3 months. I also requested the patient to have a dental clearance for consideration of treatment with Zometa for her bone disease. She was advised to call immediately if she has any concerning symptoms in the interval. The patient voices understanding of current disease status and treatment options and is in agreement with the current care plan. All questions were answered. The patient knows to call the clinic with any problems, questions or concerns. We can certainly see the patient much sooner if necessary. I spent 10 minutes counseling the patient face to face. The total time spent in the appointment was 15  minutes.   Disclaimer: This note was dictated with voice recognition software. Similar sounding words can inadvertently be transcribed and may not be corrected upon review.

## 2018-02-12 ENCOUNTER — Inpatient Hospital Stay: Payer: BLUE CROSS/BLUE SHIELD

## 2018-02-12 ENCOUNTER — Encounter: Payer: Self-pay | Admitting: Internal Medicine

## 2018-02-12 ENCOUNTER — Inpatient Hospital Stay: Payer: BLUE CROSS/BLUE SHIELD | Attending: Internal Medicine | Admitting: Internal Medicine

## 2018-02-12 ENCOUNTER — Telehealth: Payer: Self-pay | Admitting: Internal Medicine

## 2018-02-12 VITALS — BP 114/77 | HR 74 | Temp 97.9°F | Resp 17 | Ht 66.0 in | Wt 168.5 lb

## 2018-02-12 DIAGNOSIS — K449 Diaphragmatic hernia without obstruction or gangrene: Secondary | ICD-10-CM | POA: Insufficient documentation

## 2018-02-12 DIAGNOSIS — K219 Gastro-esophageal reflux disease without esophagitis: Secondary | ICD-10-CM | POA: Insufficient documentation

## 2018-02-12 DIAGNOSIS — I10 Essential (primary) hypertension: Secondary | ICD-10-CM

## 2018-02-12 DIAGNOSIS — Z9484 Stem cells transplant status: Secondary | ICD-10-CM | POA: Diagnosis not present

## 2018-02-12 DIAGNOSIS — Z9221 Personal history of antineoplastic chemotherapy: Secondary | ICD-10-CM | POA: Insufficient documentation

## 2018-02-12 DIAGNOSIS — C9000 Multiple myeloma not having achieved remission: Secondary | ICD-10-CM | POA: Diagnosis present

## 2018-02-12 DIAGNOSIS — E7800 Pure hypercholesterolemia, unspecified: Secondary | ICD-10-CM | POA: Insufficient documentation

## 2018-02-12 DIAGNOSIS — Z79899 Other long term (current) drug therapy: Secondary | ICD-10-CM | POA: Insufficient documentation

## 2018-02-12 DIAGNOSIS — E876 Hypokalemia: Secondary | ICD-10-CM | POA: Insufficient documentation

## 2018-02-12 DIAGNOSIS — E119 Type 2 diabetes mellitus without complications: Secondary | ICD-10-CM | POA: Insufficient documentation

## 2018-02-12 LAB — CMP (CANCER CENTER ONLY)
ALBUMIN: 3.7 g/dL (ref 3.5–5.0)
ALK PHOS: 67 U/L (ref 40–150)
ALT: 24 U/L (ref 0–55)
ANION GAP: 7 (ref 3–11)
AST: 29 U/L (ref 5–34)
BUN: 9 mg/dL (ref 7–26)
CALCIUM: 9.1 mg/dL (ref 8.4–10.4)
CHLORIDE: 107 mmol/L (ref 98–109)
CO2: 26 mmol/L (ref 22–29)
Creatinine: 0.98 mg/dL (ref 0.60–1.10)
GFR, Estimated: 58 mL/min — ABNORMAL LOW (ref >60)
GLUCOSE: 99 mg/dL (ref 70–140)
Potassium: 3.4 mmol/L — ABNORMAL LOW (ref 3.5–5.1)
SODIUM: 140 mmol/L (ref 136–145)
Total Bilirubin: 0.8 mg/dL (ref 0.2–1.2)
Total Protein: 7 g/dL (ref 6.4–8.3)

## 2018-02-12 LAB — CBC WITH DIFFERENTIAL (CANCER CENTER ONLY)
BASOS PCT: 1 %
Basophils Absolute: 0 10*3/uL (ref 0.0–0.1)
Eosinophils Absolute: 0.2 10*3/uL (ref 0.0–0.5)
Eosinophils Relative: 3 %
HCT: 36.2 % (ref 34.8–46.6)
HEMOGLOBIN: 11.9 g/dL (ref 11.6–15.9)
LYMPHS PCT: 39 %
Lymphs Abs: 2.1 10*3/uL (ref 0.9–3.3)
MCH: 28.9 pg (ref 25.1–34.0)
MCHC: 32.9 g/dL (ref 31.5–36.0)
MCV: 87.9 fL (ref 79.5–101.0)
MONOS PCT: 10 %
Monocytes Absolute: 0.5 10*3/uL (ref 0.1–0.9)
NEUTROS PCT: 47 %
Neutro Abs: 2.5 10*3/uL (ref 1.5–6.5)
Platelet Count: 148 10*3/uL (ref 145–400)
RBC: 4.12 MIL/uL (ref 3.70–5.45)
RDW: 14 % (ref 11.2–14.5)
WBC Count: 5.3 10*3/uL (ref 3.9–10.3)

## 2018-02-12 LAB — LACTATE DEHYDROGENASE: LDH: 178 U/L (ref 125–245)

## 2018-02-12 NOTE — Progress Notes (Signed)
[place] Telephone:(336) [phone]   Fax:(336) [phone]  OFFICE PROGRESS NOTE  [name], MD 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  PRIOR THERAPY: 1) Systemic chemotherapy with Carfilzomib, Cytoxan and dexamethasone. First dose on 11/15/2013. Status post 5 cycles. 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 3) Maintenance Revlimid 10 mg by mouth daily. Status post 2 months of treatment. 4) Maintenance Revlimid 15 mg by mouth daily. Status post 4 months of treatment. 5) Maintenance treatment with Revlimid 10 mg by mouth daily status post 17 months. She completed 2 years of treatment in February 2018.  CURRENT THERAPY: Observation.  INTERVAL HISTORY: Victoria Gates 67 y.o. female returns to the clinic today for 3 months follow-up visit accompanied by her husband.  The patient is feeling fine today with no specific complaints.  She gained around 11 pounds since her last visit.  She denied having any chest pain, shortness of breath, cough or hemoptysis.  She denied having any fever or chills.  She has no nausea, vomiting, diarrhea or constipation.  She had repeat myeloma panel performed earlier today and she is here for evaluation and discussion of her lab results.  MEDICAL HISTORY: Past Medical History:  Diagnosis Date  . ALLERGIC RHINITIS CAUSE UNSPECIFIED 01/02/2009  . ANEMIA-IRON DEFICIENCY 04/24/2007  . Bradycardia 04/29/2016  . CERVICAL RADICULOPATHY, LEFT 02/01/2010  . Chest pain 03/2012   a. 04/15/2012 Ex MV: Ex time 10 mins, EF 63%, no ischemia/infarct.  Occas PAC's/PVC's.  . COLONIC POLYPS, HX OF 04/24/2007   hyperplastic only  . DM 03/18/2008  . Encounter for antineoplastic chemotherapy 04/29/2016  . ESOPHAGEAL STRICTURE 04/15/2007   s/p dilitation  . GERD 09/21/2010  . 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 HIATAL HERNIA 04/15/2007  . HYPERCHOLESTEROLEMIA  07/03/2009  . Hypokalemia 11/30/2015  . Multiple myeloma ([place])   . OSTEOPOROSIS 04/24/2007    ALLERGIES:  is allergic to atorvastatin; rosuvastatin; and sulfonamide derivatives.  MEDICATIONS:  Current Outpatient Medications  Medication Sig Dispense Refill  . Ascorbic Acid (VITAMIN C) 1000 MG tablet Take 1,000 mg by mouth daily.     . Calcium 500-125 MG-UNIT TABS Take 1 tablet by mouth daily.      . cholecalciferol (VITAMIN D) 1000 UNITS tablet Take 1,000 Units by mouth daily.    . lansoprazole (PREVACID) 30 MG capsule Take 30 mg by mouth daily with breakfast.      No current facility-administered medications for this visit.     SURGICAL HISTORY:  Past Surgical History:  Procedure Laterality Date  . ESOPHAGOGASTRODUODENOSCOPY  04/15/2007  . TUBAL LIGATION      REVIEW OF SYSTEMS:  A comprehensive review of systems was negative.   PHYSICAL EXAMINATION: General appearance: alert, cooperative and no distress Head: Normocephalic, without obvious abnormality, atraumatic Neck: no adenopathy, no JVD, supple, symmetrical, trachea midline and thyroid not enlarged, symmetric, no tenderness/mass/nodules Lymph nodes: Cervical, supraclavicular, and axillary nodes normal. Resp: clear to auscultation bilaterally Back: symmetric, no curvature. ROM normal. No CVA tenderness. Cardio: regular rate and rhythm, S1, S2 normal, no murmur, click, rub or gallop GI: soft, non-tender; bowel sounds normal; no masses,  no organomegaly Extremities: extremities normal, atraumatic, no cyanosis or edema  ECOG PERFORMANCE STATUS: 0 - Asymptomatic  Blood pressure 114/77, pulse 74, temperature 97.9 F (36.6 C), temperature source Oral, resp. rate 17, height '5\' 6"'$  (1.676 m), weight 168 lb 8  oz (76.4 kg), SpO2 99 %.  LABORATORY DATA: Lab Results  Component Value Date   WBC 5.3 02/12/2018   HGB 11.9 02/12/2018   HCT 36.2 02/12/2018   MCV 87.9 02/12/2018   PLT 148 02/12/2018      Chemistry      Component  Value Date/Time   NA 139 11/06/2017 0742   NA 139 08/13/2017 0816   K 3.4 (L) 11/06/2017 0742   K 3.2 (L) 08/13/2017 0816   CL 106 11/06/2017 0742   CO2 22 11/06/2017 0742   CO2 26 08/13/2017 0816   BUN 12 11/06/2017 0742   BUN 9.0 08/13/2017 0816   CREATININE 1.03 11/06/2017 0742   CREATININE 1.0 08/13/2017 0816      Component Value Date/Time   CALCIUM 9.2 11/06/2017 0742   CALCIUM 9.2 08/13/2017 0816   ALKPHOS 66 11/06/2017 0742   ALKPHOS 59 08/13/2017 0816   AST 21 11/06/2017 0742   AST 22 08/13/2017 0816   ALT 21 11/06/2017 0742   ALT 17 08/13/2017 0816   BILITOT 0.7 11/06/2017 0742   BILITOT 0.80 08/13/2017 0816       RADIOGRAPHIC STUDIES: No results found.  ASSESSMENT AND PLAN:  This is a very pleasant 67 years old African-American female with multiple myeloma, IgA subtype status post induction systemic chemotherapy with Carfilzomib, Cytoxan and dexamethasone followed by peripheral blood autologous stem cell transplant followed by 2 years maintenance of treatment with Revlimid. She is currently on observation and she is feeling fine.  CBC today is unremarkable.  The myeloma panel is a still pending. I recommended for the patient to continue on observation with repeat myeloma panel in 3 months if there is no concerning findings on the lab work performed earlier today. She was advised to call immediately if she has any concerning symptoms in the interval. The patient voices understanding of current disease status and treatment options and is in agreement with the current care plan. All questions were answered. The patient knows to call the clinic with any problems, questions or concerns. We can certainly see the patient much sooner if necessary. I spent 10 minutes counseling the patient face to face. The total time spent in the appointment was 15 minutes.   Disclaimer: This note was dictated with voice recognition software. Similar sounding words can inadvertently be  transcribed and may not be corrected upon review.

## 2018-02-12 NOTE — Telephone Encounter (Signed)
Appointments scheduled AVS/Calendar printed per 5/16 los °

## 2018-02-13 LAB — KAPPA/LAMBDA LIGHT CHAINS
KAPPA FREE LGHT CHN: 17.9 mg/L (ref 3.3–19.4)
Kappa, lambda light chain ratio: 1.09 (ref 0.26–1.65)
Lambda free light chains: 16.4 mg/L (ref 5.7–26.3)

## 2018-02-13 LAB — IGG, IGA, IGM
IGA: 310 mg/dL (ref 87–352)
IGG (IMMUNOGLOBIN G), SERUM: 1126 mg/dL (ref 700–1600)
IgM (Immunoglobulin M), Srm: 29 mg/dL (ref 26–217)

## 2018-02-13 LAB — BETA 2 MICROGLOBULIN, SERUM: BETA 2 MICROGLOBULIN: 2 mg/L (ref 0.6–2.4)

## 2018-03-26 ENCOUNTER — Encounter: Payer: Self-pay | Admitting: Endocrinology

## 2018-03-26 ENCOUNTER — Ambulatory Visit (INDEPENDENT_AMBULATORY_CARE_PROVIDER_SITE_OTHER): Payer: BLUE CROSS/BLUE SHIELD | Admitting: Endocrinology

## 2018-03-26 VITALS — BP 114/82 | HR 78 | Wt 173.8 lb

## 2018-03-26 DIAGNOSIS — H6122 Impacted cerumen, left ear: Secondary | ICD-10-CM

## 2018-03-26 NOTE — Progress Notes (Signed)
Subjective:    Patient ID: Victoria Gates, female    DOB: 21-Mar-1951, 68 y.o.   MRN: [id number]  HPI 2 weeks of slightly decreased hearing at the left ear, and assoc "popping" sound.   Past Medical History:  Diagnosis Date  . ALLERGIC RHINITIS CAUSE UNSPECIFIED 01/02/2009  . ANEMIA-IRON DEFICIENCY 04/24/2007  . Bradycardia 04/29/2016  . CERVICAL RADICULOPATHY, LEFT 02/01/2010  . Chest pain 03/2012   a. 04/15/2012 Ex MV: Ex time 10 mins, EF 63%, no ischemia/infarct.  Occas PAC's/PVC's.  . COLONIC POLYPS, HX OF 04/24/2007   hyperplastic only  . DM 03/18/2008  . Encounter for antineoplastic chemotherapy 04/29/2016  . ESOPHAGEAL STRICTURE 04/15/2007   s/p dilitation  . GERD 09/21/2010  . 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 HIATAL HERNIA 04/15/2007  . HYPERCHOLESTEROLEMIA 07/03/2009  . Hypokalemia 11/30/2015  . Multiple myeloma ([place])   . OSTEOPOROSIS 04/24/2007    Past Surgical History:  Procedure Laterality Date  . ESOPHAGOGASTRODUODENOSCOPY  04/15/2007  . TUBAL LIGATION      Social History   Socioeconomic History  . Marital status: Married    Spouse name: Not on file  . Number of children: Not on file  . Years of education: Not on file  . Highest education level: Not on file  Occupational History  . Occupation: Air traffic controller: [employer]  . Financial resource strain: Not on file  . Food insecurity:    Worry: Not on file    Inability: Not on file  . Transportation needs:    Medical: Not on file    Non-medical: Not on file  Tobacco Use  . Smoking status: Never Smoker  . Smokeless tobacco: Never Used  Substance and Sexual Activity  . Alcohol use: No  . Drug use: No  . Sexual activity: Yes  Lifestyle  . Physical activity:    Days per week: Not on file    Minutes per session: Not on file  . Stress: Not on file  Relationships  . Social connections:    Talks on phone: Not on file    Gets together: Not on file    Attends religious service: Not on file    Active member of club or organization: Not on file    Attends meetings of clubs or organizations: Not on file    Relationship status: Not on file  . Intimate partner violence:    Fear of current or ex partner: Not on file    Emotionally abused: Not on file    Physically abused: Not on file    Forced sexual activity: Not on file  Other Topics Concern  . Not on file  Social History Narrative   Lives in [place] with spouse.   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    Current Outpatient Medications on File Prior to Visit  Medication Sig Dispense Refill  . Ascorbic Acid (VITAMIN C) 1000 MG tablet Take 1,000 mg by mouth daily.     . Calcium 500-125 MG-UNIT TABS Take 1 tablet by mouth daily.      . cholecalciferol (VITAMIN D) 1000 UNITS tablet Take 1,000 Units by mouth daily.    . lansoprazole (PREVACID) 30 MG capsule Take 30 mg by mouth daily with breakfast.      No current facility-administered medications on file prior to visit.     Allergies  Allergen Reactions  . Atorvastatin     REACTION: myalgias  . Rosuvastatin  REACTION: myalgias  . Sulfonamide Derivatives     Unknown reaction     Family History  Problem Relation Age of Onset  . Brain cancer Mother   . Heart disease Mother   . Lung cancer Father   . Heart disease Father   . Diabetes Sister   . Heart disease Sister   . Diabetes Brother   . Heart disease Brother   . Cancer Neg Hx        No FH of Colon Cancer  . Colon cancer Neg Hx   . Esophageal cancer Neg Hx   . Rectal cancer Neg Hx   . Stomach cancer Neg Hx     BP 114/82 (BP Location: Left Arm, Patient Position: Sitting, Cuff Size: Normal)   Pulse 78   Wt 173 lb 12.8 oz (78.8 kg)   SpO2 98%   BMI 28.05 kg/m    Review of Systems Denies fever, earache, and nasal congestion    Objective:   Physical Exam VITAL SIGNS:  See vs page GENERAL: no distress Left EAC is occluded with cerumen  Intervention: I removed cerumen with a plastic  curette, but more remains.        Assessment & Plan:  Cerumen impaction, new  Patient Instructions  Putting peroxide drops into the left ear, to dissolve the ear wax. I'll see you next time.

## 2018-03-26 NOTE — Patient Instructions (Signed)
Putting peroxide drops into the left ear, to dissolve the ear wax. I'll see you next time.

## 2018-04-07 ENCOUNTER — Telehealth: Payer: Self-pay | Admitting: Internal Medicine

## 2018-04-07 NOTE — Telephone Encounter (Signed)
Called pt re appt on 8/6 being shifted. Left vm  For pt re appts.

## 2018-05-05 ENCOUNTER — Inpatient Hospital Stay: Payer: BLUE CROSS/BLUE SHIELD | Attending: Internal Medicine

## 2018-05-05 DIAGNOSIS — Z9484 Stem cells transplant status: Secondary | ICD-10-CM | POA: Diagnosis not present

## 2018-05-05 DIAGNOSIS — C9000 Multiple myeloma not having achieved remission: Secondary | ICD-10-CM | POA: Insufficient documentation

## 2018-05-05 DIAGNOSIS — Z9221 Personal history of antineoplastic chemotherapy: Secondary | ICD-10-CM | POA: Insufficient documentation

## 2018-05-05 LAB — CMP (CANCER CENTER ONLY)
ALBUMIN: 3.8 g/dL (ref 3.5–5.0)
ALK PHOS: 59 U/L (ref 38–126)
ALT: 18 U/L (ref 0–44)
AST: 21 U/L (ref 15–41)
Anion gap: 10 (ref 5–15)
BUN: 9 mg/dL (ref 8–23)
CALCIUM: 8.9 mg/dL (ref 8.9–10.3)
CO2: 23 mmol/L (ref 22–32)
CREATININE: 1.01 mg/dL — AB (ref 0.44–1.00)
Chloride: 108 mmol/L (ref 98–111)
GFR, Est AFR Am: >60 mL/min (ref >60)
GFR, Estimated: 56 mL/min — ABNORMAL LOW (ref >60)
Glucose, Bld: 99 mg/dL (ref 70–99)
Potassium: 3.7 mmol/L (ref 3.5–5.1)
SODIUM: 141 mmol/L (ref 135–145)
Total Bilirubin: 0.6 mg/dL (ref 0.3–1.2)
Total Protein: 7 g/dL (ref 6.5–8.1)

## 2018-05-05 LAB — CBC WITH DIFFERENTIAL (CANCER CENTER ONLY)
BASOS PCT: 1 %
Basophils Absolute: 0 10*3/uL (ref 0.0–0.1)
EOS ABS: 0.1 10*3/uL (ref 0.0–0.5)
Eosinophils Relative: 2 %
HEMATOCRIT: 37 % (ref 34.8–46.6)
Hemoglobin: 12.4 g/dL (ref 11.6–15.9)
Lymphocytes Relative: 48 %
Lymphs Abs: 2.1 10*3/uL (ref 0.9–3.3)
MCH: 28.9 pg (ref 25.1–34.0)
MCHC: 33.5 g/dL (ref 31.5–36.0)
MCV: 86.2 fL (ref 79.5–101.0)
MONOS PCT: 9 %
Monocytes Absolute: 0.4 10*3/uL (ref 0.1–0.9)
NEUTROS ABS: 1.7 10*3/uL (ref 1.5–6.5)
Neutrophils Relative %: 40 %
Platelet Count: 176 10*3/uL (ref 145–400)
RBC: 4.29 MIL/uL (ref 3.70–5.45)
RDW: 13.4 % (ref 11.2–14.5)
WBC Count: 4.3 10*3/uL (ref 3.9–10.3)

## 2018-05-05 LAB — LACTATE DEHYDROGENASE: LDH: 160 U/L (ref 98–192)

## 2018-05-06 LAB — BETA 2 MICROGLOBULIN, SERUM: Beta-2 Microglobulin: 1.4 mg/L (ref 0.6–2.4)

## 2018-05-06 LAB — KAPPA/LAMBDA LIGHT CHAINS
KAPPA, LAMDA LIGHT CHAIN RATIO: 1.15 (ref 0.26–1.65)
Kappa free light chain: 15.6 mg/L (ref 3.3–19.4)
Lambda free light chains: 13.6 mg/L (ref 5.7–26.3)

## 2018-05-06 LAB — IGG, IGA, IGM
IGA: 301 mg/dL (ref 87–352)
IGM (IMMUNOGLOBULIN M), SRM: 27 mg/dL (ref 26–217)
IgG (Immunoglobin G), Serum: 1210 mg/dL (ref 700–1600)

## 2018-05-08 ENCOUNTER — Emergency Department (HOSPITAL_COMMUNITY): Payer: BLUE CROSS/BLUE SHIELD

## 2018-05-08 ENCOUNTER — Encounter (HOSPITAL_COMMUNITY): Payer: Self-pay | Admitting: Emergency Medicine

## 2018-05-08 ENCOUNTER — Other Ambulatory Visit: Payer: Self-pay

## 2018-05-08 ENCOUNTER — Emergency Department (HOSPITAL_COMMUNITY)
Admission: EM | Admit: 2018-05-08 | Discharge: 2018-05-08 | Disposition: A | Discharge status: Home / Self Care | Payer: BLUE CROSS/BLUE SHIELD | Attending: Emergency Medicine | Admitting: Emergency Medicine

## 2018-05-08 DIAGNOSIS — R079 Chest pain, unspecified: Secondary | ICD-10-CM | POA: Insufficient documentation

## 2018-05-08 DIAGNOSIS — Z79899 Other long term (current) drug therapy: Secondary | ICD-10-CM | POA: Diagnosis not present

## 2018-05-08 DIAGNOSIS — I10 Essential (primary) hypertension: Secondary | ICD-10-CM | POA: Insufficient documentation

## 2018-05-08 DIAGNOSIS — E119 Type 2 diabetes mellitus without complications: Secondary | ICD-10-CM | POA: Diagnosis not present

## 2018-05-08 LAB — CBC
HCT: 35.9 % — ABNORMAL LOW (ref 36.0–46.0)
Hemoglobin: 11.6 g/dL — ABNORMAL LOW (ref 12.0–15.0)
MCH: 29 pg (ref 26.0–34.0)
MCHC: 32.3 g/dL (ref 30.0–36.0)
MCV: 89.8 fL (ref 78.0–100.0)
Platelets: 162 10*3/uL (ref 150–400)
RBC: 4 MIL/uL (ref 3.87–5.11)
RDW: 13.3 % (ref 11.5–15.5)
WBC: 4.3 10*3/uL (ref 4.0–10.5)

## 2018-05-08 LAB — BASIC METABOLIC PANEL
Anion gap: 9 (ref 5–15)
BUN: 7 mg/dL — ABNORMAL LOW (ref 8–23)
CO2: 24 mmol/L (ref 22–32)
Calcium: 9 mg/dL (ref 8.9–10.3)
Chloride: 106 mmol/L (ref 98–111)
Creatinine, Ser: 1.07 mg/dL — ABNORMAL HIGH (ref 0.44–1.00)
GFR calc Af Amer: >60 mL/min (ref >60)
GFR calc non Af Amer: 52 mL/min — ABNORMAL LOW (ref >60)
Glucose, Bld: 133 mg/dL — ABNORMAL HIGH (ref 70–99)
Potassium: 3.5 mmol/L (ref 3.5–5.1)
Sodium: 139 mmol/L (ref 135–145)

## 2018-05-08 LAB — I-STAT TROPONIN, ED
Troponin i, poc: 0.01 ng/mL (ref 0.00–0.08)
Troponin i, poc: 0 ng/mL (ref 0.00–0.08)

## 2018-05-08 NOTE — Discharge Instructions (Addendum)
Patient had to eat breakfast daily and take her Prevacid.  Follow-up with family doctor for recheck.  Return if worsening symptoms.

## 2018-05-08 NOTE — ED Provider Notes (Signed)
[facility] EMERGENCY DEPARTMENT Provider Note   CSN: [id number] Arrival date & time: 05/08/18  1884     History   Chief Complaint Chief Complaint  Patient presents with  . Chest Pain    HPI Victoria Gates is a 67 y.o. female.  HPI  Victoria Gates is a 67 y.o. female with hx of HTN, DM, Multiple myeloma in remission, presents to ED with chest pain. Pt was at work, was at rest when pain began. Pain is in the bilateral chest, below breast, sudden onset. Pain described as achy but 10/10. No associated symptoms, including no n/v, sob, dizziness. No pain radiation. Hx of GERD. Did not eat breakfast or taken prevacid. No cardiac hx. Pt is non smoker. Does not take estrogen supplements. No recent travel or surgeries.    Past Medical History:  Diagnosis Date  . ALLERGIC RHINITIS CAUSE UNSPECIFIED 01/02/2009  . ANEMIA-IRON DEFICIENCY 04/24/2007  . Bradycardia 04/29/2016  . CERVICAL RADICULOPATHY, LEFT 02/01/2010  . Chest pain 03/2012   a. 04/15/2012 Ex MV: Ex time 10 mins, EF 63%, no ischemia/infarct.  Occas PAC's/PVC's.  . COLONIC POLYPS, HX OF 04/24/2007   hyperplastic only  . DM 03/18/2008  . Encounter for antineoplastic chemotherapy 04/29/2016  . ESOPHAGEAL STRICTURE 04/15/2007   s/p dilitation  . GERD 09/21/2010  . 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 HIATAL HERNIA 04/15/2007  . HYPERCHOLESTEROLEMIA 07/03/2009  . Hypokalemia 11/30/2015  . Multiple myeloma ([place])   . OSTEOPOROSIS 04/24/2007    Patient Active Problem List   Diagnosis Date Noted  . Encounter for antineoplastic chemotherapy 04/29/2016  . Bradycardia 04/29/2016  . Multiple myeloma not having achieved remission ([place]) 11/30/2015  . Hypokalemia 11/30/2015  . Neutropenia ([place]) 05/03/2015  . Syncope 09/11/2014  . Bronchitis 09/08/2014  . Multiple myeloma ([place]) 10/22/2013  . 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 Hyperproteinemia 04/14/2013  . Midsternal chest pain 04/20/2012  . Lipoma of shoulder 11/02/2011  .  Routine general medical examination at a health care facility 03/26/2011  . 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 GERD 09/21/2010  . CERVICAL RADICULOPATHY, LEFT 02/01/2010  . CORNS AND CALLOSITIES 12/18/2009  . HYPERCHOLESTEROLEMIA 07/03/2009  . OTITIS EXTERNA 07/03/2009  . Cerumen impaction 07/03/2009  . MYALGIA 07/03/2009  . WRIST INJURY, RIGHT 05/15/2009  . Allergic rhinitis 01/02/2009  . OTHER DYSPHAGIA 09/01/2008  . Diabetes ([place]) 03/18/2008  . Unspecified Chest Pain 12/24/2007  . URI 12/12/2007  . ANEMIA-IRON DEFICIENCY 04/24/2007  . Essential hypertension 04/24/2007  . Osteoporosis 04/24/2007  . COLONIC POLYPS, HX OF 04/24/2007  . ESOPHAGEAL STRICTURE 04/15/2007  . HIATAL HERNIA 04/15/2007    Past Surgical History:  Procedure Laterality Date  . ESOPHAGOGASTRODUODENOSCOPY  04/15/2007  . TUBAL LIGATION       OB History   None      Home Medications    Prior to Admission medications   Medication Sig Start Date End Date Taking? Authorizing Provider  Ascorbic Acid (VITAMIN C) 1000 MG tablet Take 1,000 mg by mouth daily.     [provider]  Calcium 500-125 MG-UNIT TABS Take 1 tablet by mouth daily.      [provider]  cholecalciferol (VITAMIN D) 1000 UNITS tablet Take 1,000 Units by mouth daily.    [provider]  lansoprazole (PREVACID) 30 MG capsule Take 30 mg by mouth daily with breakfast.     [provider]    Family History Family History  Problem Relation Age of Onset  . Brain cancer Mother   .  Heart disease Mother   . Lung cancer Father   . Heart disease Father   . Diabetes Sister   . Heart disease Sister   . Diabetes Brother   . Heart disease Brother   . Cancer Neg Hx        No FH of Colon Cancer  . Colon cancer Neg Hx   . Esophageal cancer Neg Hx   . Rectal cancer Neg Hx   . Stomach cancer Neg Hx     Social History Social History   Tobacco Use  . Smoking status: Never Smoker  . Smokeless tobacco: Never Used    Substance Use Topics  . Alcohol use: No  . Drug use: No     Allergies   Atorvastatin; Rosuvastatin; and Sulfonamide derivatives   Review of Systems Review of Systems  Constitutional: Negative for chills and fever.  Respiratory: Positive for chest tightness. Negative for cough and shortness of breath.   Cardiovascular: Positive for chest pain. Negative for palpitations and leg swelling.  Gastrointestinal: Negative for abdominal pain, diarrhea, nausea and vomiting.  Genitourinary: Negative for dysuria, flank pain, pelvic pain, vaginal bleeding, vaginal discharge and vaginal pain.  Musculoskeletal: Negative for arthralgias, myalgias, neck pain and neck stiffness.  Skin: Negative for rash.  Neurological: Negative for dizziness, weakness and headaches.  All other systems reviewed and are negative.    Physical Exam Updated Vital Signs BP 113/76 (BP Location: Right Arm)   Pulse 69   Temp 97.8 F (36.6 C) (Oral)   Resp 18   Ht _0  (1.676 m)   Wt 77.1 kg   SpO2 100%   BMI 27.44 kg/m   Physical Exam  Constitutional: She appears well-developed and well-nourished. No distress.  HENT:  Head: Normocephalic.  Eyes: Conjunctivae are normal.  Neck: Neck supple.  Cardiovascular: Normal rate, regular rhythm and normal heart sounds.  Pulmonary/Chest: Effort normal and breath sounds normal. No respiratory distress. She has no wheezes. She has no rales. She exhibits no tenderness.  Abdominal: Soft. Bowel sounds are normal. She exhibits no distension. There is no tenderness. There is no rebound.  Musculoskeletal: She exhibits no edema.  Neurological: She is alert.  Skin: Skin is warm and dry.  Psychiatric: She has a normal mood and affect. Her behavior is normal.  Nursing note and vitals reviewed.    ED Treatments / Results  Labs (all labs ordered are listed, but only abnormal results are displayed) Labs Reviewed  BASIC METABOLIC PANEL - Abnormal; Notable for the following  components:      Result Value   Glucose, Bld 133 (*)    BUN 7 (*)    Creatinine, Ser 1.07 (*)    GFR calc non Af Amer 52 (*)    All other components within normal limits  CBC - Abnormal; Notable for the following components:   Hemoglobin 11.6 (*)    HCT 35.9 (*)    All other components within normal limits  I-STAT TROPONIN, ED  I-STAT TROPONIN, ED    EKG EKG Interpretation  Date/Time:  Friday May 08 2018 10:05:25 EDT Ventricular Rate:  61 PR Interval:  158 QRS Duration: 94 QT Interval:  442 QTC Calculation: 444 R Axis:   63 Text Interpretation:  Normal sinus rhythm with sinus arrhythmia Normal ECG Confirmed by Virgel Manifold [phone]) on 05/08/2018 10:25:09 AM   Radiology Dg Chest 2 View  Result Date: 05/08/2018 CLINICAL DATA:  Left-sided chest pain EXAM: CHEST - 2 VIEW COMPARISON:  05/09/2016 FINDINGS:  Cardiac shadow is within normal limits. The lungs are well aerated bilaterally. No focal infiltrate or pneumothorax is seen. No acute bony abnormality is noted. IMPRESSION: No active cardiopulmonary disease. Electronically Signed   By: Inez Catalina M.D.   On: 05/08/2018 10:54    Procedures Procedures (including critical care time)  Medications Ordered in ED Medications - No data to display   Initial Impression / Assessment and Plan / ED Course  I have reviewed the triage vital signs and the nursing notes.  Pertinent labs & imaging results that were available during my care of the patient were reviewed by me and considered in my medical decision making (see chart for details).     Patient in emergency department with sudden onset of chest pain which resolved after taking Prevacid.  Patient currently in no acute distress.  No prior cardiac problems other than bradycardia, which she is asymptomatic for.  She has no associated symptoms.  She has no exam findings.  She is in no acute distress.  Vital signs are normal, no concern for PE given no risk factors with no  tachycardia, hypoxia, tachypnea.  Will check labs including troponin, EKG, chest x-ray.   Initial troponin is 0.01.  Labs otherwise unremarkable.  Will monitor patient and repeat troponin.  Patient has a heart score of 3.  Relatively low risk.   2:32 PM Repeat trop is 0.00. Pt continues to be chest pain free. Discussed with Dr. Wilson Singer. Agrees pt is stable for dc home with outpatient follow up. Pat agreeable to plan as well. Return precautions discussed.   Vitals:   05/08/18 1330 05/08/18 1345  BP: 121/79 130/78  Pulse: (!) 42 (!) 49  Resp: 19 18  Temp:    SpO2: 100% 100%     Final Clinical Impressions(s) / ED Diagnoses   Final diagnoses:  Chest pain, unspecified type    ED Discharge Orders    None       Jeannett Senior, PA-C 05/08/18 1448    Virgel Manifold, MD 05/10/18 1055

## 2018-05-08 NOTE — ED Triage Notes (Signed)
Patient presents to the ED with complaints of left sided chest pain. Patient denies any Shortness of breath, N/V. Patient denies any Cardiac history. Patient alert and oriented on arrival.

## 2018-05-12 ENCOUNTER — Encounter: Payer: Self-pay | Admitting: Endocrinology

## 2018-05-12 ENCOUNTER — Ambulatory Visit (INDEPENDENT_AMBULATORY_CARE_PROVIDER_SITE_OTHER): Payer: BLUE CROSS/BLUE SHIELD | Admitting: Endocrinology

## 2018-05-12 ENCOUNTER — Encounter: Payer: Self-pay | Admitting: Internal Medicine

## 2018-05-12 ENCOUNTER — Telehealth: Payer: Self-pay | Admitting: Internal Medicine

## 2018-05-12 ENCOUNTER — Inpatient Hospital Stay (HOSPITAL_BASED_OUTPATIENT_CLINIC_OR_DEPARTMENT_OTHER): Payer: BLUE CROSS/BLUE SHIELD | Admitting: Internal Medicine

## 2018-05-12 VITALS — BP 138/89 | HR 102 | Wt 180.0 lb

## 2018-05-12 VITALS — BP 141/63 | HR 66 | Temp 97.8°F | Resp 18 | Ht 66.0 in | Wt 179.1 lb

## 2018-05-12 DIAGNOSIS — Z9221 Personal history of antineoplastic chemotherapy: Secondary | ICD-10-CM

## 2018-05-12 DIAGNOSIS — R079 Chest pain, unspecified: Secondary | ICD-10-CM

## 2018-05-12 DIAGNOSIS — I10 Essential (primary) hypertension: Secondary | ICD-10-CM

## 2018-05-12 DIAGNOSIS — Z9484 Stem cells transplant status: Secondary | ICD-10-CM | POA: Diagnosis not present

## 2018-05-12 DIAGNOSIS — C9000 Multiple myeloma not having achieved remission: Secondary | ICD-10-CM

## 2018-05-12 NOTE — Patient Instructions (Addendum)
Let's check the "treadmill" (heart) test.  you will receive a phone call, about a day and time for an appointment.

## 2018-05-12 NOTE — Progress Notes (Signed)
Subjective:    Patient ID: Victoria Gates, female    DOB: [birth year], 67 y.o.   MRN: [id number]  HPI 4 days ago, pt was in ER with moderate pain at the ant chest, but no assoc LOC.   Past Medical History:  Diagnosis Date  . ALLERGIC RHINITIS CAUSE UNSPECIFIED 01/02/2009  . ANEMIA-IRON DEFICIENCY 04/24/2007  . Bradycardia 04/29/2016  . CERVICAL RADICULOPATHY, LEFT 02/01/2010  . Chest pain 03/2012   a. 04/15/2012 Ex MV: Ex time 10 mins, EF 63%, no ischemia/infarct.  Occas PAC's/PVC's.  . COLONIC POLYPS, HX OF 04/24/2007   hyperplastic only  . DM 03/18/2008  . Encounter for antineoplastic chemotherapy 04/29/2016  . ESOPHAGEAL STRICTURE 04/15/2007   s/p dilitation  . GERD 09/21/2010  . 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 HIATAL HERNIA 04/15/2007  . HYPERCHOLESTEROLEMIA 07/03/2009  . Hypokalemia 11/30/2015  . Multiple myeloma ([place])   . OSTEOPOROSIS 04/24/2007    Past Surgical History:  Procedure Laterality Date  . ESOPHAGOGASTRODUODENOSCOPY  04/15/2007  . TUBAL LIGATION      Social History   Socioeconomic History  . Marital status: Married    Spouse name: Not on file  . Number of children: Not on file  . Years of education: Not on file  . Highest education level: Not on file  Occupational History  . Occupation: Air traffic controller: [employer]  . Financial resource strain: Not on file  . Food insecurity:    Worry: Not on file    Inability: Not on file  . Transportation needs:    Medical: Not on file    Non-medical: Not on file  Tobacco Use  . Smoking status: Never Smoker  . Smokeless tobacco: Never Used  Substance and Sexual Activity  . Alcohol use: No  . Drug use: No  . Sexual activity: Yes  Lifestyle  . Physical activity:    Days per week: Not on file    Minutes per session: Not on file  . Stress: Not on file  Relationships  . Social connections:    Talks on phone: Not on file    Gets together: Not on file    Attends religious service: Not on file   Active member of club or organization: Not on file    Attends meetings of clubs or organizations: Not on file    Relationship status: Not on file  . Intimate partner violence:    Fear of current or ex partner: Not on file    Emotionally abused: Not on file    Physically abused: Not on file    Forced sexual activity: Not on file  Other Topics Concern  . Not on file  Social History Narrative   Lives in [place] with spouse.   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    Current Outpatient Medications on File Prior to Visit  Medication Sig Dispense Refill  . Ascorbic Acid (VITAMIN C) 1000 MG tablet Take 1,000 mg by mouth daily.     . Calcium 500-125 MG-UNIT TABS Take 1 tablet by mouth daily.      . cholecalciferol (VITAMIN D) 1000 UNITS tablet Take 1,000 Units by mouth daily.    . lansoprazole (PREVACID) 30 MG capsule Take 30 mg by mouth daily with breakfast.      No current facility-administered medications on file prior to visit.     Allergies  Allergen Reactions  . Atorvastatin     REACTION: myalgias  .  Rosuvastatin     REACTION: myalgias  . Sulfonamide Derivatives     Unknown reaction     Family History  Problem Relation Age of Onset  . Brain cancer Mother   . Heart disease Mother   . Lung cancer Father   . Heart disease Father   . Diabetes Sister   . Heart disease Sister   . Diabetes Brother   . Heart disease Brother   . Cancer Neg Hx        No FH of Colon Cancer  . Colon cancer Neg Hx   . Esophageal cancer Neg Hx   . Rectal cancer Neg Hx   . Stomach cancer Neg Hx     BP 138/89   Pulse (!) 102   Wt 180 lb (81.6 kg)   SpO2 97%   BMI 29.05 kg/m    Review of Systems Denies LOC and palpitations.    Objective:   Physical Exam VITAL SIGNS:  See vs page GENERAL: no distress LUNGS:  Clear to auscultation HEART:  Regular rate and rhythm without murmurs noted. Normal S1,S2.         Assessment & Plan:  Chest pain, new to me, uncertain  etiology.   Patient Instructions  Let's check the "treadmill" (heart) test.  you will receive a phone call, about a day and time for an appointment.

## 2018-05-12 NOTE — Telephone Encounter (Signed)
Scheduled appt per 8/13 los - gave patient AVS and calender per los.  

## 2018-05-12 NOTE — Progress Notes (Signed)
[facility] Telephone:([phone]) [phone]   Fax:([phone]  OFFICE PROGRESS NOTE  [name], MD 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  PRIOR THERAPY: 1) Systemic chemotherapy with Carfilzomib, Cytoxan and dexamethasone. First dose on 11/15/2013. Status post 5 cycles. 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 3) Maintenance Revlimid 10 mg by mouth daily. Status post 2 months of treatment. 4) Maintenance Revlimid 15 mg by mouth daily. Status post 4 months of treatment. 5) Maintenance treatment with Revlimid 10 mg by mouth daily status post 17 months. She completed 2 years of treatment in February 2018.  CURRENT THERAPY: Observation.  INTERVAL HISTORY: Victoria Gates 67 y.o. female returns to the clinic today for follow-up visit.  The patient is feeling fine today with no concerning complaints.  She denied having any current chest pain, shortness of breath, cough or hemoptysis.  She denied having any fever or chills.  She has no nausea, vomiting, diarrhea or constipation.  She was seen recently at the hospital with chest pain and cardiac work-up was unremarkable.  She has no recent weight loss or night sweats.  The patient had repeat myeloma panel performed recently and she is here for evaluation and discussion of her lab results.  MEDICAL HISTORY: Past Medical History:  Diagnosis Date  . ALLERGIC RHINITIS CAUSE UNSPECIFIED 01/02/2009  . ANEMIA-IRON DEFICIENCY 04/24/2007  . Bradycardia 04/29/2016  . CERVICAL RADICULOPATHY, LEFT 02/01/2010  . Chest pain 03/2012   a. 04/15/2012 Ex MV: Ex time 10 mins, EF 63%, no ischemia/infarct.  Occas PAC's/PVC's.  . COLONIC POLYPS, HX OF 04/24/2007   hyperplastic only  . DM 03/18/2008  . Encounter for antineoplastic chemotherapy 04/29/2016  . ESOPHAGEAL STRICTURE 04/15/2007   s/p dilitation  . GERD 09/21/2010  . HERPES  ZOSTER 11/05/2010  . HIATAL HERNIA 04/15/2007  . HYPERCHOLESTEROLEMIA 07/03/2009  . Hypokalemia 11/30/2015  . Multiple myeloma ([place])   . OSTEOPOROSIS 04/24/2007    ALLERGIES:  is allergic to atorvastatin; rosuvastatin; and sulfonamide derivatives.  MEDICATIONS:  Current Outpatient Medications  Medication Sig Dispense Refill  . Ascorbic Acid (VITAMIN C) 1000 MG tablet Take 1,000 mg by mouth daily.     . Calcium 500-125 MG-UNIT TABS Take 1 tablet by mouth daily.      . cholecalciferol (VITAMIN D) 1000 UNITS tablet Take 1,000 Units by mouth daily.    . lansoprazole (PREVACID) 30 MG capsule Take 30 mg by mouth daily with breakfast.      No current facility-administered medications for this visit.     SURGICAL HISTORY:  Past Surgical History:  Procedure Laterality Date  . ESOPHAGOGASTRODUODENOSCOPY  04/15/2007  . TUBAL LIGATION      REVIEW OF SYSTEMS:  A comprehensive review of systems was negative.   PHYSICAL EXAMINATION: General appearance: alert, cooperative and no distress Head: Normocephalic, without obvious abnormality, atraumatic Neck: no adenopathy, no JVD, supple, symmetrical, trachea midline and thyroid not enlarged, symmetric, no tenderness/mass/nodules Lymph nodes: Cervical, supraclavicular, and axillary nodes normal. Resp: clear to auscultation bilaterally Back: symmetric, no curvature. ROM normal. No CVA tenderness. Cardio: regular rate and rhythm, S1, S2 normal, no murmur, click, rub or gallop GI: soft, non-tender; bowel sounds normal; no masses,  no organomegaly Extremities: extremities normal, atraumatic, no cyanosis or edema  ECOG PERFORMANCE STATUS: 0 - Asymptomatic  Blood pressure (!) 141/63, pulse 66, temperature 97.8 F (36.6 C), temperature source Oral,  resp. rate 18, height _0  (1.676 m), weight 179 lb 1.6 oz (81.2 kg), SpO2 100 %.  LABORATORY DATA: Lab Results  Component Value Date   WBC 4.3 05/08/2018   HGB 11.6 (L) 05/08/2018   HCT 35.9 (L) 05/08/2018    MCV 89.8 05/08/2018   PLT 162 05/08/2018      Chemistry      Component Value Date/Time   NA 139 05/08/2018 1008   NA 139 08/13/2017 0816   K 3.5 05/08/2018 1008   K 3.2 (L) 08/13/2017 0816   CL 106 05/08/2018 1008   CO2 24 05/08/2018 1008   CO2 26 08/13/2017 0816   BUN 7 (L) 05/08/2018 1008   BUN 9.0 08/13/2017 0816   CREATININE 1.07 (H) 05/08/2018 1008   CREATININE 1.01 (H) 05/05/2018 0820   CREATININE 1.0 08/13/2017 0816      Component Value Date/Time   CALCIUM 9.0 05/08/2018 1008   CALCIUM 9.2 08/13/2017 0816   ALKPHOS 59 05/05/2018 0820   ALKPHOS 59 08/13/2017 0816   AST 21 05/05/2018 0820   AST 22 08/13/2017 0816   ALT 18 05/05/2018 0820   ALT 17 08/13/2017 0816   BILITOT 0.6 05/05/2018 0820   BILITOT 0.80 08/13/2017 0816       RADIOGRAPHIC STUDIES: Dg Chest 2 View  Result Date: 05/08/2018 CLINICAL DATA:  Left-sided chest pain EXAM: CHEST - 2 VIEW COMPARISON:  05/09/2016 FINDINGS: Cardiac shadow is within normal limits. The lungs are well aerated bilaterally. No focal infiltrate or pneumothorax is seen. No acute bony abnormality is noted. IMPRESSION: No active cardiopulmonary disease. Electronically Signed   By: Inez Catalina M.D.   On: 05/08/2018 10:54    ASSESSMENT AND PLAN:  This is a very pleasant 67 years old African-American female with multiple myeloma, IgA subtype status post induction systemic chemotherapy with Carfilzomib, Cytoxan and dexamethasone followed by peripheral blood autologous stem cell transplant followed by 2 years maintenance of treatment with Revlimid. The patient is currently on observation. She is doing fine with no concerning complaints. Myeloma panel performed recently showed no concerning findings for disease progression. I discussed the lab results with the patient and recommended for her to continue on observation with repeat myeloma panel in 3 months. She was advised to call immediately if she has any concerning symptoms in the  interval. The patient voices understanding of current disease status and treatment options and is in agreement with the current care plan. All questions were answered. The patient knows to call the clinic with any problems, questions or concerns. We can certainly see the patient much sooner if necessary. I spent 10 minutes counseling the patient face to face. The total time spent in the appointment was 15 minutes.   Disclaimer: This note was dictated with voice recognition software. Similar sounding words can inadvertently be transcribed and may not be corrected upon review.

## 2018-05-20 ENCOUNTER — Telehealth: Payer: Self-pay | Admitting: *Deleted

## 2018-05-20 NOTE — Telephone Encounter (Signed)
Copied from [place] [phone]. Topic: Inquiry >> May 20, 2018 10:48 AM Oliver Pila B wrote: Reason for CRM: pt's husband who is a pt of Dr. Carollee Herter would like to see if Dr. Etter Sjogren can take her on as a new pt; contact to advise

## 2018-05-22 NOTE — Telephone Encounter (Signed)
ok 

## 2018-05-22 NOTE — Telephone Encounter (Signed)
Please review problem list first

## 2018-05-27 NOTE — Telephone Encounter (Signed)
So do you want to still take her on as new patient?

## 2018-05-27 NOTE — Telephone Encounter (Signed)
Can you get her on the schedule at some point.?

## 2018-05-27 NOTE — Telephone Encounter (Signed)
yes

## 2018-05-28 NOTE — Telephone Encounter (Signed)
Copied from [place] [phone]. Topic: Inquiry >> May 20, 2018 10:48 AM Oliver Pila B wrote: Reason for CRM: pt's husband who is a pt of Dr. Carollee Herter would like to see if Dr. Etter Sjogren can take her on as a new pt; contact to advise >> May 22, 2018 10:49 AM Yvette Rack wrote: Pt husband calling back about a NP appt with Dr Carollee Herter for his wife please call husband Chrissie Noa back at [phone]

## 2018-05-28 NOTE — Telephone Encounter (Signed)
Left message for pt to call back to set up new patient appt with Dr. Etter Sjogren.

## 2018-06-05 ENCOUNTER — Encounter: Payer: Self-pay | Admitting: Family Medicine

## 2018-06-05 ENCOUNTER — Ambulatory Visit (INDEPENDENT_AMBULATORY_CARE_PROVIDER_SITE_OTHER): Payer: BLUE CROSS/BLUE SHIELD | Admitting: Family Medicine

## 2018-06-05 VITALS — BP 128/80 | HR 74 | Temp 98.0°F | Resp 16 | Ht 66.0 in | Wt 182.0 lb

## 2018-06-05 DIAGNOSIS — H6122 Impacted cerumen, left ear: Secondary | ICD-10-CM

## 2018-06-05 DIAGNOSIS — Z23 Encounter for immunization: Secondary | ICD-10-CM

## 2018-06-05 NOTE — Patient Instructions (Signed)
Earwax Buildup, Adult The ears produce a substance called earwax that helps keep bacteria out of the ear and protects the skin in the ear canal. Occasionally, earwax can build up in the ear and cause discomfort or hearing loss. What increases the risk? This condition is more likely to develop in people who:  Are female.  Are elderly.  Naturally produce more earwax.  Clean their ears often with cotton swabs.  Use earplugs often.  Use in-ear headphones often.  Wear hearing aids.  Have narrow ear canals.  Have earwax that is overly thick or sticky.  Have eczema.  Are dehydrated.  Have excess hair in the ear canal.  What are the signs or symptoms? Symptoms of this condition include:  Reduced or muffled hearing.  A feeling of fullness in the ear or feeling that the ear is plugged.  Fluid coming from the ear.  Ear pain.  Ear itch.  Ringing in the ear.  Coughing.  An obvious piece of earwax that can be seen inside the ear canal.  How is this diagnosed? This condition may be diagnosed based on:  Your symptoms.  Your medical history.  An ear exam. During the exam, your health care provider will look into your ear with an instrument called an otoscope.  You may have tests, including a hearing test. How is this treated? This condition may be treated by:  Using ear drops to soften the earwax.  Having the earwax removed by a health care provider. The health care provider may: ? Flush the ear with water. ? Use an instrument that has a loop on the end (curette). ? Use a suction device.  Surgery to remove the wax buildup. This may be done in severe cases.  Follow these instructions at home:  Take over-the-counter and prescription medicines only as told by your health care provider.  Do not put any objects, including cotton swabs, into your ear. You can clean the opening of your ear canal with a washcloth or facial tissue.  Follow instructions from your health  care provider about cleaning your ears. Do not over-clean your ears.  Drink enough fluid to keep your urine clear or pale yellow. This will help to thin the earwax.  Keep all follow-up visits as told by your health care provider. If earwax builds up in your ears often or if you use hearing aids, consider seeing your health care provider for routine, preventive ear cleanings. Ask your health care provider how often you should schedule your cleanings.  If you have hearing aids, clean them according to instructions from the manufacturer and your health care provider. Contact a health care provider if:  You have ear pain.  You develop a fever.  You have blood, pus, or other fluid coming from your ear.  You have hearing loss.  You have ringing in your ears that does not go away.  Your symptoms do not improve with treatment.  You feel like the room is spinning (vertigo). Summary  Earwax can build up in the ear and cause discomfort or hearing loss.  The most common symptoms of this condition include reduced or muffled hearing and a feeling of fullness in the ear or feeling that the ear is plugged.  This condition may be diagnosed based on your symptoms, your medical history, and an ear exam.  This condition may be treated by using ear drops to soften the earwax or by having the earwax removed by a health care provider.  Do   not put any objects, including cotton swabs, into your ear. You can clean the opening of your ear canal with a washcloth or facial tissue. This information is not intended to replace advice given to you by your health care provider. Make sure you discuss any questions you have with your health care provider. Document Released: 10/24/2004 Document Revised: 11/27/2016 Document Reviewed: 11/27/2016 Elsevier Interactive Patient Education  2018 Elsevier Inc.  

## 2018-06-05 NOTE — Assessment & Plan Note (Addendum)
Cleared successfully---B/L  Use debrox  rto cpe

## 2018-06-05 NOTE — Progress Notes (Addendum)
Patient ID: Victoria Gates, female    DOB: 1951/09/19  Age: 67 y.o. MRN: [id number]    Subjective:  Subjective  HPI  Victoria Gates presents to establish.  She has no complaints today except her ears feel full.  She has hx mult myeloma and sees Dr Julien Nordmann.  Review of Systems  Constitutional: Negative for activity change, appetite change, fatigue, fever and unexpected weight change.  HENT: Negative for congestion, rhinorrhea, sinus pressure, sinus pain and sore throat.   Respiratory: Negative for cough and shortness of breath.   Cardiovascular: Negative for chest pain, palpitations and leg swelling.  Gastrointestinal: Negative for abdominal pain, blood in stool and nausea.  Genitourinary: Negative for dysuria and frequency.  Skin: Negative for rash.  Allergic/Immunologic: Negative for environmental allergies.  Neurological: Negative for dizziness and headaches.  Psychiatric/Behavioral: Negative for behavioral problems and dysphoric mood. The patient is not nervous/anxious.     History Past Medical History:  Diagnosis Date  . ALLERGIC RHINITIS CAUSE UNSPECIFIED 01/02/2009  . ANEMIA-IRON DEFICIENCY 04/24/2007  . Bradycardia 04/29/2016  . CERVICAL RADICULOPATHY, LEFT 02/01/2010  . Chest pain 03/2012   a. 04/15/2012 Ex MV: Ex time 10 mins, EF 63%, no ischemia/infarct.  Occas PAC's/PVC's.  . COLONIC POLYPS, HX OF 04/24/2007   hyperplastic only  . DM 03/18/2008  . Encounter for antineoplastic chemotherapy 04/29/2016  . ESOPHAGEAL STRICTURE 04/15/2007   s/p dilitation  . GERD 09/21/2010  . 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 HIATAL HERNIA 04/15/2007  . HYPERCHOLESTEROLEMIA 07/03/2009  . Hypokalemia 11/30/2015  . Multiple myeloma ([place])   . OSTEOPOROSIS 04/24/2007    She has a past surgical history that includes Tubal ligation and Esophagogastroduodenoscopy (04/15/2007).   Her family history includes Brain cancer in her mother; Diabetes in her brother and sister; Heart disease in her brother, father, mother,  and sister; Lung cancer in her father.She reports that she has never smoked. She has never used smokeless tobacco. She reports that she does not drink alcohol or use drugs.  Current Outpatient Medications on File Prior to Visit  Medication Sig Dispense Refill  . Ascorbic Acid (VITAMIN C) 1000 MG tablet Take 1,000 mg by mouth daily.     . Calcium 500-125 MG-UNIT TABS Take 1 tablet by mouth daily.      . cholecalciferol (VITAMIN D) 1000 UNITS tablet Take 1,000 Units by mouth daily.    . lansoprazole (PREVACID) 30 MG capsule Take 30 mg by mouth daily with breakfast.      No current facility-administered medications on file prior to visit.      Objective:  Objective  Physical Exam  Constitutional: She is oriented to person, place, and time. She appears well-developed and well-nourished.  HENT:  Head: Normocephalic and atraumatic.  Right Ear: Hearing, tympanic membrane, external ear and ear canal normal.  Left Ear: Hearing, tympanic membrane, external ear and ear canal normal.  Ears:  Eyes: Conjunctivae and EOM are normal.  Neck: Normal range of motion. Neck supple. No JVD present. Carotid bruit is not present. No thyromegaly present.  Cardiovascular: Normal rate, regular rhythm and normal heart sounds.  No murmur heard. Pulmonary/Chest: Effort normal and breath sounds normal. No respiratory distress. She has no wheezes. She has no rales. She exhibits no tenderness.  Musculoskeletal: She exhibits no edema.  Neurological: She is alert and oriented to person, place, and time.  Psychiatric: She has a normal mood and affect.  Nursing note and vitals reviewed.  BP 128/80 (BP Location: Right Arm, Patient Position:  Sitting, Cuff Size: Normal)   Pulse 74   Temp 98 F (36.7 C) (Oral)   Resp 16   Ht '5\' 6"'$  (1.676 m)   Wt 182 lb (82.6 kg)   SpO2 98%   BMI 29.38 kg/m  Wt Readings from Last 3 Encounters:  06/05/18 182 lb (82.6 kg)  05/12/18 180 lb (81.6 kg)  05/12/18 179 lb 1.6 oz (81.2 kg)      Lab Results  Component Value Date   WBC 4.3 05/08/2018   HGB 11.6 (L) 05/08/2018   HCT 35.9 (L) 05/08/2018   PLT 162 05/08/2018   GLUCOSE 133 (H) 05/08/2018   CHOL 225 (H) 09/01/2017   TRIG 176.0 (H) 09/01/2017   HDL 38.90 (L) 09/01/2017   LDLDIRECT 70.7 04/14/2013   LDLCALC 151 (H) 09/01/2017   ALT 18 05/05/2018   AST 21 05/05/2018   NA 139 05/08/2018   K 3.5 05/08/2018   CL 106 05/08/2018   CREATININE 1.07 (H) 05/08/2018   BUN 7 (L) 05/08/2018   CO2 24 05/08/2018   TSH 4.17 09/01/2017   INR 1.22 05/09/2016   HGBA1C 5.3 09/01/2017   MICROALBUR <0.7 09/01/2017    Dg Chest 2 View  Result Date: 05/08/2018 CLINICAL DATA:  Left-sided chest pain EXAM: CHEST - 2 VIEW COMPARISON:  05/09/2016 FINDINGS: Cardiac shadow is within normal limits. The lungs are well aerated bilaterally. No focal infiltrate or pneumothorax is seen. No acute bony abnormality is noted. IMPRESSION: No active cardiopulmonary disease. Electronically Signed   By: Inez Catalina M.D.   On: 05/08/2018 10:54     Assessment & Plan:  Plan  I am having Victoria Gates maintain her Calcium, cholecalciferol, vitamin C, and lansoprazole.  No orders of the defined types were placed in this encounter.   Problem List Items Addressed This Visit      Unprioritized   Cerumen impaction    Cleared successfully---B/L  Use debrox  rto cpe        Other Visit Diagnoses    Need for influenza vaccination    -  Primary   Relevant Orders   Flu vaccine HIGH DOSE PF (Fluzone High dose) (Completed)      Follow-up: Return in about 6 months (around 12/04/2018), or if symptoms worsen or fail to improve, for annual exam, fasting.  Ann Held, DO

## 2018-06-30 LAB — HM DIABETES EYE EXAM

## 2018-08-04 ENCOUNTER — Inpatient Hospital Stay: Payer: BLUE CROSS/BLUE SHIELD | Attending: Internal Medicine

## 2018-08-04 DIAGNOSIS — Z9221 Personal history of antineoplastic chemotherapy: Secondary | ICD-10-CM | POA: Diagnosis not present

## 2018-08-04 DIAGNOSIS — C9000 Multiple myeloma not having achieved remission: Secondary | ICD-10-CM | POA: Insufficient documentation

## 2018-08-04 LAB — CBC WITH DIFFERENTIAL (CANCER CENTER ONLY)
Abs Immature Granulocytes: 0.01 10*3/uL (ref 0.00–0.07)
BASOS ABS: 0 10*3/uL (ref 0.0–0.1)
Basophils Relative: 1 %
EOS ABS: 0.1 10*3/uL (ref 0.0–0.5)
EOS PCT: 2 %
HEMATOCRIT: 36.7 % (ref 36.0–46.0)
Hemoglobin: 11.9 g/dL — ABNORMAL LOW (ref 12.0–15.0)
IMMATURE GRANULOCYTES: 0 %
LYMPHS ABS: 2.2 10*3/uL (ref 0.7–4.0)
Lymphocytes Relative: 45 %
MCH: 28.6 pg (ref 26.0–34.0)
MCHC: 32.4 g/dL (ref 30.0–36.0)
MCV: 88.2 fL (ref 80.0–100.0)
Monocytes Absolute: 0.5 10*3/uL (ref 0.1–1.0)
Monocytes Relative: 9 %
NEUTROS PCT: 43 %
Neutro Abs: 2.2 10*3/uL (ref 1.7–7.7)
Platelet Count: 154 10*3/uL (ref 150–400)
RBC: 4.16 MIL/uL (ref 3.87–5.11)
RDW: 13.4 % (ref 11.5–15.5)
WBC: 5.1 10*3/uL (ref 4.0–10.5)
nRBC: 0 % (ref 0.0–0.2)

## 2018-08-04 LAB — CMP (CANCER CENTER ONLY)
ALK PHOS: 58 U/L (ref 38–126)
ALT: 24 U/L (ref 0–44)
ANION GAP: 9 (ref 5–15)
AST: 23 U/L (ref 15–41)
Albumin: 3.4 g/dL — ABNORMAL LOW (ref 3.5–5.0)
BUN: 13 mg/dL (ref 8–23)
CALCIUM: 9.1 mg/dL (ref 8.9–10.3)
CO2: 23 mmol/L (ref 22–32)
CREATININE: 1.15 mg/dL — AB (ref 0.44–1.00)
Chloride: 109 mmol/L (ref 98–111)
GFR, EST AFRICAN AMERICAN: 56 mL/min — AB (ref >60)
GFR, EST NON AFRICAN AMERICAN: 48 mL/min — AB (ref >60)
Glucose, Bld: 133 mg/dL — ABNORMAL HIGH (ref 70–99)
Potassium: 3.3 mmol/L — ABNORMAL LOW (ref 3.5–5.1)
SODIUM: 141 mmol/L (ref 135–145)
Total Bilirubin: 0.5 mg/dL (ref 0.3–1.2)
Total Protein: 6.6 g/dL (ref 6.5–8.1)

## 2018-08-04 LAB — LACTATE DEHYDROGENASE: LDH: 147 U/L (ref 98–192)

## 2018-08-05 LAB — KAPPA/LAMBDA LIGHT CHAINS
KAPPA FREE LGHT CHN: 14.2 mg/L (ref 3.3–19.4)
Kappa, lambda light chain ratio: 1.01 (ref 0.26–1.65)
Lambda free light chains: 14 mg/L (ref 5.7–26.3)

## 2018-08-05 LAB — IGG, IGA, IGM
IGA: 292 mg/dL (ref 87–352)
IGG (IMMUNOGLOBIN G), SERUM: 1109 mg/dL (ref 700–1600)
IGM (IMMUNOGLOBULIN M), SRM: 36 mg/dL (ref 26–217)

## 2018-08-05 LAB — BETA 2 MICROGLOBULIN, SERUM: Beta-2 Microglobulin: 1.5 mg/L (ref 0.6–2.4)

## 2018-08-11 ENCOUNTER — Telehealth: Payer: Self-pay | Admitting: Internal Medicine

## 2018-08-11 ENCOUNTER — Encounter: Payer: Self-pay | Admitting: Internal Medicine

## 2018-08-11 ENCOUNTER — Inpatient Hospital Stay (HOSPITAL_BASED_OUTPATIENT_CLINIC_OR_DEPARTMENT_OTHER): Payer: BLUE CROSS/BLUE SHIELD | Admitting: Internal Medicine

## 2018-08-11 VITALS — BP 155/81 | HR 64 | Temp 97.5°F | Resp 18 | Ht 66.0 in | Wt 190.8 lb

## 2018-08-11 DIAGNOSIS — C9000 Multiple myeloma not having achieved remission: Secondary | ICD-10-CM

## 2018-08-11 DIAGNOSIS — Z9221 Personal history of antineoplastic chemotherapy: Secondary | ICD-10-CM | POA: Diagnosis not present

## 2018-08-11 NOTE — Telephone Encounter (Signed)
Scheduled appt per 11/12 los- gave patient AVS and calender per los.   

## 2018-08-11 NOTE — Progress Notes (Signed)
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  PRIOR THERAPY: 1) Systemic chemotherapy with Carfilzomib, Cytoxan and dexamethasone. First dose on 11/15/2013. Status post 5 cycles. 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 3) Maintenance Revlimid 10 mg by mouth daily. Status post 2 months of treatment. 4) Maintenance Revlimid 15 mg by mouth daily. Status post 4 months of treatment. 5) Maintenance treatment with Revlimid 10 mg by mouth daily status post 17 months. She completed 2 years of treatment in February 2018.  CURRENT THERAPY: Observation.  INTERVAL HISTORY: Victoria Gates 67 y.o. female returns to the clinic today for follow-up visit accompanied by her husband.  The patient is feeling fine today with no concerning complaints.  She denied having any chest pain, shortness of breath, cough or hemoptysis.  She denied having any fever or chills.  She has no nausea, vomiting, diarrhea or constipation.  She is currently on observation.  She had repeat myeloma panel performed recently and she is here for evaluation and discussion of her lab results.  MEDICAL HISTORY: Past Medical History:  Diagnosis Date  . ALLERGIC RHINITIS CAUSE UNSPECIFIED 01/02/2009  . ANEMIA-IRON DEFICIENCY 04/24/2007  . Bradycardia 04/29/2016  . CERVICAL RADICULOPATHY, LEFT 02/01/2010  . Chest pain 03/2012   a. 04/15/2012 Ex MV: Ex time 10 mins, EF 63%, no ischemia/infarct.  Occas PAC's/PVC's.  . COLONIC POLYPS, HX OF 04/24/2007   hyperplastic only  . DM 03/18/2008  . Encounter for antineoplastic chemotherapy 04/29/2016  . ESOPHAGEAL STRICTURE 04/15/2007   s/p dilitation  . GERD 09/21/2010  . 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 HIATAL HERNIA 04/15/2007  . HYPERCHOLESTEROLEMIA 07/03/2009  .  Hypokalemia 11/30/2015  . Multiple myeloma ([place])   . OSTEOPOROSIS 04/24/2007    ALLERGIES:  is allergic to atorvastatin; rosuvastatin; and sulfonamide derivatives.  MEDICATIONS:  Current Outpatient Medications  Medication Sig Dispense Refill  . Ascorbic Acid (VITAMIN C) 1000 MG tablet Take 1,000 mg by mouth daily.     . Calcium 500-125 MG-UNIT TABS Take 1 tablet by mouth daily.      . cholecalciferol (VITAMIN D) 1000 UNITS tablet Take 1,000 Units by mouth daily.    . lansoprazole (PREVACID) 30 MG capsule Take 30 mg by mouth daily with breakfast.      No current facility-administered medications for this visit.     SURGICAL HISTORY:  Past Surgical History:  Procedure Laterality Date  . ESOPHAGOGASTRODUODENOSCOPY  04/15/2007  . TUBAL LIGATION      REVIEW OF SYSTEMS:  A comprehensive review of systems was negative.   PHYSICAL EXAMINATION: General appearance: alert, cooperative and no distress Head: Normocephalic, without obvious abnormality, atraumatic Neck: no adenopathy, no JVD, supple, symmetrical, trachea midline and thyroid not enlarged, symmetric, no tenderness/mass/nodules Lymph nodes: Cervical, supraclavicular, and axillary nodes normal. Resp: clear to auscultation bilaterally Back: symmetric, no curvature. ROM normal. No CVA tenderness. Cardio: regular rate and rhythm, S1, S2 normal, no murmur, click, rub or gallop GI: soft, non-tender; bowel sounds normal; no masses,  no organomegaly Extremities: extremities normal, atraumatic, no cyanosis or edema  ECOG PERFORMANCE STATUS: 0 - Asymptomatic  Blood pressure (!) 155/81, pulse 64, temperature (!) 97.5 F (36.4 C), temperature source Oral, resp. rate 18, height '5\' 6"'$  (1.676 m), weight 190 lb 12.8 oz (86.5  kg), SpO2 100 %.  LABORATORY DATA: Lab Results  Component Value Date   WBC 5.1 08/04/2018   HGB 11.9 (L) 08/04/2018   HCT 36.7 08/04/2018   MCV 88.2 08/04/2018   PLT 154 08/04/2018      Chemistry      Component  Value Date/Time   NA 141 08/04/2018 0738   NA 139 08/13/2017 0816   K 3.3 (L) 08/04/2018 0738   K 3.2 (L) 08/13/2017 0816   CL 109 08/04/2018 0738   CO2 23 08/04/2018 0738   CO2 26 08/13/2017 0816   BUN 13 08/04/2018 0738   BUN 9.0 08/13/2017 0816   CREATININE 1.15 (H) 08/04/2018 0738   CREATININE 1.0 08/13/2017 0816      Component Value Date/Time   CALCIUM 9.1 08/04/2018 0738   CALCIUM 9.2 08/13/2017 0816   ALKPHOS 58 08/04/2018 0738   ALKPHOS 59 08/13/2017 0816   AST 23 08/04/2018 0738   AST 22 08/13/2017 0816   ALT 24 08/04/2018 0738   ALT 17 08/13/2017 0816   BILITOT 0.5 08/04/2018 0738   BILITOT 0.80 08/13/2017 0816       RADIOGRAPHIC STUDIES: No results found.  ASSESSMENT AND PLAN:  This is a very pleasant 67 years old African-American female with multiple myeloma, IgA subtype status post induction systemic chemotherapy with Carfilzomib, Cytoxan and dexamethasone followed by peripheral blood autologous stem cell transplant followed by 2 years maintenance of treatment with Revlimid. The patient is currently on observation and she is feeling fine. She had repeat myeloma panel performed recently.  Her myeloma panel showed no concerning findings for disease progression. I recommended for the patient to continue on observation with repeat myeloma panel in 3 months. The patient was advised to call immediately if she has any concerning symptoms in the interval. For the hypokalemia, I recommended for her to increase her potassium rich diet. She was advised to call immediately if she has any concerning symptoms in the interval. The patient voices understanding of current disease status and treatment options and is in agreement with the current care plan. All questions were answered. The patient knows to call the clinic with any problems, questions or concerns. We can certainly see the patient much sooner if necessary. I spent 10 minutes counseling the patient face to face. The  total time spent in the appointment was 15 minutes.   Disclaimer: This note was dictated with voice recognition software. Similar sounding words can inadvertently be transcribed and may not be corrected upon review.

## 2018-09-01 ENCOUNTER — Encounter: Payer: Self-pay | Admitting: Endocrinology

## 2018-09-01 ENCOUNTER — Ambulatory Visit (INDEPENDENT_AMBULATORY_CARE_PROVIDER_SITE_OTHER): Payer: BLUE CROSS/BLUE SHIELD | Admitting: Endocrinology

## 2018-09-01 VITALS — BP 128/82 | HR 84 | Ht 66.0 in | Wt 190.4 lb

## 2018-09-01 DIAGNOSIS — E119 Type 2 diabetes mellitus without complications: Secondary | ICD-10-CM

## 2018-09-01 LAB — POCT GLYCOSYLATED HEMOGLOBIN (HGB A1C): Hemoglobin A1C: 5.2 % (ref 4.0–5.6)

## 2018-09-01 NOTE — Patient Instructions (Signed)
good diet and exercise significantly improve the control of your diabetes.  You don't need any medication for the blood sugar now. I would be happy to see you back here as needed

## 2018-09-01 NOTE — Progress Notes (Signed)
Subjective:    Patient ID: Victoria Gates, female    DOB: [birth year], 67 y.o.   MRN: [id number]  HPI Pt returns for f/u of diabetes mellitus: DM type: 2 Dx'ed: 2683 Complications: renal insuff Therapy: none now GDM: never DKA: never Severe hypoglycemia: never Pancreatitis: never Other: she has never been on insulin Interval history: pt states she feels well in general.  Past Medical History:  Diagnosis Date  . ALLERGIC RHINITIS CAUSE UNSPECIFIED 01/02/2009  . ANEMIA-IRON DEFICIENCY 04/24/2007  . Bradycardia 04/29/2016  . CERVICAL RADICULOPATHY, LEFT 02/01/2010  . Chest pain 03/2012   a. 04/15/2012 Ex MV: Ex time 10 mins, EF 63%, no ischemia/infarct.  Occas PAC's/PVC's.  . COLONIC POLYPS, HX OF 04/24/2007   hyperplastic only  . DM 03/18/2008  . Encounter for antineoplastic chemotherapy 04/29/2016  . ESOPHAGEAL STRICTURE 04/15/2007   s/p dilitation  . GERD 09/21/2010  . 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 HIATAL HERNIA 04/15/2007  . HYPERCHOLESTEROLEMIA 07/03/2009  . Hypokalemia 11/30/2015  . Multiple myeloma ([place])   . OSTEOPOROSIS 04/24/2007    Past Surgical History:  Procedure Laterality Date  . ESOPHAGOGASTRODUODENOSCOPY  04/15/2007  . TUBAL LIGATION      Social History   Socioeconomic History  . Marital status: Married    Spouse name: Not on file  . Number of children: Not on file  . Years of education: Not on file  . Highest education level: Not on file  Occupational History  . Occupation: Air traffic controller: [employer]  . Financial resource strain: Not on file  . Food insecurity:    Worry: Not on file    Inability: Not on file  . Transportation needs:    Medical: Not on file    Non-medical: Not on file  Tobacco Use  . Smoking status: Never Smoker  . Smokeless tobacco: Never Used  Substance and Sexual Activity  . Alcohol use: No  . Drug use: No  . Sexual activity: Yes  Lifestyle  . Physical activity:    Days per week: Not on file    Minutes  per session: Not on file  . Stress: Not on file  Relationships  . Social connections:    Talks on phone: Not on file    Gets together: Not on file    Attends religious service: Not on file    Active member of club or organization: Not on file    Attends meetings of clubs or organizations: Not on file    Relationship status: Not on file  . Intimate partner violence:    Fear of current or ex partner: Not on file    Emotionally abused: Not on file    Physically abused: Not on file    Forced sexual activity: Not on file  Other Topics Concern  . Not on file  Social History Narrative   Lives in [place] with spouse.   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    Current Outpatient Medications on File Prior to Visit  Medication Sig Dispense Refill  . Ascorbic Acid (VITAMIN C) 1000 MG tablet Take 1,000 mg by mouth daily.     . Calcium 500-125 MG-UNIT TABS Take 1 tablet by mouth daily.      . cholecalciferol (VITAMIN D) 1000 UNITS tablet Take 1,000 Units by mouth daily.    . lansoprazole (PREVACID) 30 MG capsule Take 30 mg by mouth daily with breakfast.      No current  facility-administered medications on file prior to visit.     Allergies  Allergen Reactions  . Atorvastatin     REACTION: myalgias  . Rosuvastatin     REACTION: myalgias  . Sulfonamide Derivatives     Unknown reaction     Family History  Problem Relation Age of Onset  . Brain cancer Mother   . Heart disease Mother   . Lung cancer Father   . Heart disease Father   . Diabetes Sister   . Heart disease Sister   . Diabetes Brother   . Heart disease Brother   . Cancer Neg Hx        No FH of Colon Cancer  . Colon cancer Neg Hx   . Esophageal cancer Neg Hx   . Rectal cancer Neg Hx   . Stomach cancer Neg Hx     BP 128/82 (BP Location: Left Arm, Patient Position: Sitting, Cuff Size: Large)   Pulse 84   Ht _0  (1.676 m)   Wt 190 lb 6.4 oz (86.4 kg)   SpO2 94%   BMI 30.73 kg/m    Review of  Systems She has regained 10 lbs since last ov here.    Objective:   Physical Exam VITAL SIGNS:  See vs page GENERAL: no distress Pulses: dorsalis pedis intact bilat.   MSK: no deformity of the feet CV: no leg edema Skin:  no ulcer on the feet.  normal color and temp on the feet. Neuro: sensation is intact to touch on the feet  Lab Results  Component Value Date   CREATININE 1.15 (H) 08/04/2018   BUN 13 08/04/2018   NA 141 08/04/2018   K 3.3 (L) 08/04/2018   CL 109 08/04/2018   CO2 23 08/04/2018   Lab Results  Component Value Date   HGBA1C 5.2 09/01/2018      Assessment & Plan:  Type 2 DM: stable Renal insuff: this limits rx options.   Patient Instructions  good diet and exercise significantly improve the control of your diabetes.  You don't need any medication for the blood sugar now. I would be happy to see you back here as needed

## 2018-09-25 ENCOUNTER — Other Ambulatory Visit: Payer: Self-pay | Admitting: Family Medicine

## 2018-09-25 DIAGNOSIS — Z1231 Encounter for screening mammogram for malignant neoplasm of breast: Secondary | ICD-10-CM

## 2018-10-07 ENCOUNTER — Ambulatory Visit
Admission: RE | Admit: 2018-10-07 | Discharge: 2018-10-07 | Disposition: A | Discharge status: Home / Self Care | Payer: BLUE CROSS/BLUE SHIELD | Source: Ambulatory Visit

## 2018-10-07 DIAGNOSIS — Z1231 Encounter for screening mammogram for malignant neoplasm of breast: Secondary | ICD-10-CM

## 2018-10-08 ENCOUNTER — Other Ambulatory Visit: Payer: Self-pay | Admitting: Family Medicine

## 2018-10-08 DIAGNOSIS — R928 Other abnormal and inconclusive findings on diagnostic imaging of breast: Secondary | ICD-10-CM

## 2018-10-09 ENCOUNTER — Telehealth: Payer: Self-pay | Admitting: *Deleted

## 2018-10-09 NOTE — Telephone Encounter (Signed)
Received Physician Orders from [facility]; forwarded to provider/SLS 01/10

## 2018-10-13 ENCOUNTER — Ambulatory Visit: Payer: BLUE CROSS/BLUE SHIELD

## 2018-10-13 ENCOUNTER — Ambulatory Visit
Admission: RE | Admit: 2018-10-13 | Discharge: 2018-10-13 | Disposition: A | Discharge status: Home / Self Care | Payer: BLUE CROSS/BLUE SHIELD | Source: Ambulatory Visit | Attending: Family Medicine | Admitting: Family Medicine

## 2018-10-13 DIAGNOSIS — R928 Other abnormal and inconclusive findings on diagnostic imaging of breast: Secondary | ICD-10-CM

## 2018-11-11 ENCOUNTER — Inpatient Hospital Stay: Payer: BLUE CROSS/BLUE SHIELD | Attending: Internal Medicine

## 2018-11-11 DIAGNOSIS — E876 Hypokalemia: Secondary | ICD-10-CM | POA: Diagnosis not present

## 2018-11-11 DIAGNOSIS — C9000 Multiple myeloma not having achieved remission: Secondary | ICD-10-CM | POA: Diagnosis not present

## 2018-11-11 DIAGNOSIS — Z79899 Other long term (current) drug therapy: Secondary | ICD-10-CM | POA: Diagnosis not present

## 2018-11-11 LAB — CBC WITH DIFFERENTIAL (CANCER CENTER ONLY)
ABS IMMATURE GRANULOCYTES: 0.01 10*3/uL (ref 0.00–0.07)
Basophils Absolute: 0.1 10*3/uL (ref 0.0–0.1)
Basophils Relative: 1 %
Eosinophils Absolute: 0.1 10*3/uL (ref 0.0–0.5)
Eosinophils Relative: 2 %
HEMATOCRIT: 38.1 % (ref 36.0–46.0)
HEMOGLOBIN: 12.1 g/dL (ref 12.0–15.0)
Immature Granulocytes: 0 %
LYMPHS ABS: 2.4 10*3/uL (ref 0.7–4.0)
LYMPHS PCT: 44 %
MCH: 28.3 pg (ref 26.0–34.0)
MCHC: 31.8 g/dL (ref 30.0–36.0)
MCV: 89.2 fL (ref 80.0–100.0)
MONO ABS: 0.5 10*3/uL (ref 0.1–1.0)
Monocytes Relative: 9 %
NEUTROS ABS: 2.4 10*3/uL (ref 1.7–7.7)
Neutrophils Relative %: 44 %
Platelet Count: 164 10*3/uL (ref 150–400)
RBC: 4.27 MIL/uL (ref 3.87–5.11)
RDW: 13.6 % (ref 11.5–15.5)
WBC: 5.5 10*3/uL (ref 4.0–10.5)
nRBC: 0 % (ref 0.0–0.2)

## 2018-11-11 LAB — CMP (CANCER CENTER ONLY)
ALBUMIN: 3.6 g/dL (ref 3.5–5.0)
ALK PHOS: 58 U/L (ref 38–126)
ALT: 23 U/L (ref 0–44)
AST: 26 U/L (ref 15–41)
Anion gap: 9 (ref 5–15)
BILIRUBIN TOTAL: 0.6 mg/dL (ref 0.3–1.2)
BUN: 12 mg/dL (ref 8–23)
CALCIUM: 9 mg/dL (ref 8.9–10.3)
CO2: 23 mmol/L (ref 22–32)
CREATININE: 1.17 mg/dL — AB (ref 0.44–1.00)
Chloride: 110 mmol/L (ref 98–111)
GFR, Est AFR Am: 55 mL/min — ABNORMAL LOW (ref >60)
GFR, Estimated: 48 mL/min — ABNORMAL LOW (ref >60)
GLUCOSE: 116 mg/dL — AB (ref 70–99)
Potassium: 3.3 mmol/L — ABNORMAL LOW (ref 3.5–5.1)
Sodium: 142 mmol/L (ref 135–145)
Total Protein: 7 g/dL (ref 6.5–8.1)

## 2018-11-11 LAB — LACTATE DEHYDROGENASE: LDH: 170 U/L (ref 98–192)

## 2018-11-12 LAB — KAPPA/LAMBDA LIGHT CHAINS
Kappa free light chain: 14.2 mg/L (ref 3.3–19.4)
Kappa, lambda light chain ratio: 0.92 (ref 0.26–1.65)
Lambda free light chains: 15.4 mg/L (ref 5.7–26.3)

## 2018-11-12 LAB — IGG, IGA, IGM
IgA: 315 mg/dL (ref 87–352)
IgG (Immunoglobin G), Serum: 1206 mg/dL (ref 700–1600)
IgM (Immunoglobulin M), Srm: 35 mg/dL (ref 26–217)

## 2018-11-12 LAB — BETA 2 MICROGLOBULIN, SERUM: BETA 2 MICROGLOBULIN: 1.9 mg/L (ref 0.6–2.4)

## 2018-11-18 ENCOUNTER — Telehealth: Payer: Self-pay | Admitting: Internal Medicine

## 2018-11-18 ENCOUNTER — Encounter: Payer: Self-pay | Admitting: Internal Medicine

## 2018-11-18 ENCOUNTER — Inpatient Hospital Stay (HOSPITAL_BASED_OUTPATIENT_CLINIC_OR_DEPARTMENT_OTHER): Payer: BLUE CROSS/BLUE SHIELD | Admitting: Internal Medicine

## 2018-11-18 VITALS — BP 124/72 | HR 72 | Temp 98.5°F | Resp 18 | Ht 66.0 in | Wt 192.6 lb

## 2018-11-18 DIAGNOSIS — C9000 Multiple myeloma not having achieved remission: Secondary | ICD-10-CM

## 2018-11-18 DIAGNOSIS — Z79899 Other long term (current) drug therapy: Secondary | ICD-10-CM | POA: Diagnosis not present

## 2018-11-18 DIAGNOSIS — E876 Hypokalemia: Secondary | ICD-10-CM | POA: Diagnosis not present

## 2018-11-18 DIAGNOSIS — I10 Essential (primary) hypertension: Secondary | ICD-10-CM

## 2018-11-18 NOTE — Telephone Encounter (Signed)
Gave avs and calendar ° °

## 2018-11-18 NOTE — Progress Notes (Signed)
[place] [phone]   [phone]  OFFICE PROGRESS NOTE  Ann Held, DO [address] 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  PRIOR THERAPY: 1) Systemic chemotherapy with Carfilzomib, Cytoxan and dexamethasone. First dose on 11/15/2013. Status post 5 cycles. 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 3) Maintenance Revlimid 10 mg by mouth daily. Status post 2 months of treatment. 4) Maintenance Revlimid 15 mg by mouth daily. Status post 4 months of treatment. 5) Maintenance treatment with Revlimid 10 mg by mouth daily status post 17 months. She completed 2 years of treatment in February 2018.  CURRENT THERAPY: Observation.  INTERVAL HISTORY: Victoria Gates 68 y.o. female returns to the clinic today for 3 months follow-up visit.  The patient is feeling fine today with no concerning complaints.  She gained few pounds since her last visit.  She denied having any chest pain, shortness of breath, cough or hemoptysis.  She has no nausea, vomiting, diarrhea or constipation.  She has no headache or visual changes.  The patient had repeat myeloma panel performed recently and she is here for evaluation and discussion of her lab results.  MEDICAL HISTORY: Past Medical History:  Diagnosis Date  . ALLERGIC RHINITIS CAUSE UNSPECIFIED 01/02/2009  . ANEMIA-IRON DEFICIENCY 04/24/2007  . Bradycardia 04/29/2016  . CERVICAL RADICULOPATHY, LEFT 02/01/2010  . Chest pain 03/2012   a. 04/15/2012 Ex MV: Ex time 10 mins, EF 63%, no ischemia/infarct.  Occas PAC's/PVC's.  . COLONIC POLYPS, HX OF 04/24/2007   hyperplastic only  . DM 03/18/2008  . Encounter for antineoplastic chemotherapy 04/29/2016  . ESOPHAGEAL STRICTURE 04/15/2007   s/p dilitation  . GERD 09/21/2010  . 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 HIATAL HERNIA 04/15/2007  . HYPERCHOLESTEROLEMIA 07/03/2009  .  Hypokalemia 11/30/2015  . Multiple myeloma ([place])   . OSTEOPOROSIS 04/24/2007    ALLERGIES:  is allergic to atorvastatin; rosuvastatin; and sulfonamide derivatives.  MEDICATIONS:  Current Outpatient Medications  Medication Sig Dispense Refill  . Ascorbic Acid (VITAMIN C) 1000 MG tablet Take 1,000 mg by mouth daily.     . Calcium 500-125 MG-UNIT TABS Take 1 tablet by mouth daily.      . cholecalciferol (VITAMIN D) 1000 UNITS tablet Take 1,000 Units by mouth daily.    . lansoprazole (PREVACID) 30 MG capsule Take 30 mg by mouth daily with breakfast.      No current facility-administered medications for this visit.     SURGICAL HISTORY:  Past Surgical History:  Procedure Laterality Date  . ESOPHAGOGASTRODUODENOSCOPY  04/15/2007  . TUBAL LIGATION      REVIEW OF SYSTEMS:  A comprehensive review of systems was negative.   PHYSICAL EXAMINATION: General appearance: alert, cooperative and no distress Head: Normocephalic, without obvious abnormality, atraumatic Neck: no adenopathy, no JVD, supple, symmetrical, trachea midline and thyroid not enlarged, symmetric, no tenderness/mass/nodules Lymph nodes: Cervical, supraclavicular, and axillary nodes normal. Resp: clear to auscultation bilaterally Back: symmetric, no curvature. ROM normal. No CVA tenderness. Cardio: regular rate and rhythm, S1, S2 normal, no murmur, click, rub or gallop GI: soft, non-tender; bowel sounds normal; no masses,  no organomegaly Extremities: extremities normal, atraumatic, no cyanosis or edema  ECOG PERFORMANCE STATUS: 0 - Asymptomatic  Blood pressure 124/72, pulse 72, temperature 98.5 F (36.9 C), temperature source Oral, resp. rate 18, height '5\' 6"'$  (1.676 m), weight 192 lb 9.6 oz (87.4  kg), SpO2 100 %.  LABORATORY DATA: Lab Results  Component Value Date   WBC 5.5 11/11/2018   HGB 12.1 11/11/2018   HCT 38.1 11/11/2018   MCV 89.2 11/11/2018   PLT 164 11/11/2018      Chemistry      Component Value  Date/Time   NA 142 11/11/2018 0734   NA 139 08/13/2017 0816   K 3.3 (L) 11/11/2018 0734   K 3.2 (L) 08/13/2017 0816   CL 110 11/11/2018 0734   CO2 23 11/11/2018 0734   CO2 26 08/13/2017 0816   BUN 12 11/11/2018 0734   BUN 9.0 08/13/2017 0816   CREATININE 1.17 (H) 11/11/2018 0734   CREATININE 1.0 08/13/2017 0816      Component Value Date/Time   CALCIUM 9.0 11/11/2018 0734   CALCIUM 9.2 08/13/2017 0816   ALKPHOS 58 11/11/2018 0734   ALKPHOS 59 08/13/2017 0816   AST 26 11/11/2018 0734   AST 22 08/13/2017 0816   ALT 23 11/11/2018 0734   ALT 17 08/13/2017 0816   BILITOT 0.6 11/11/2018 0734   BILITOT 0.80 08/13/2017 0816       RADIOGRAPHIC STUDIES: No results found.  ASSESSMENT AND PLAN:  This is a very pleasant 68 years old African-American female with multiple myeloma, IgA subtype status post induction systemic chemotherapy with Carfilzomib, Cytoxan and dexamethasone followed by peripheral blood autologous stem cell transplant followed by 2 years maintenance of treatment with Revlimid. The patient is currently on observation and she is feeling fine. Repeat myeloma panel performed recently showed no concerning findings for disease progression. I recommended for the patient to continue on observation with repeat myeloma panel in 3 months. For the hypokalemia, she was advised to increase her potassium rich diet. The patient was advised to call immediately if she has any other concerning symptoms in the interval. The patient voices understanding of current disease status and treatment options and is in agreement with the current care plan. All questions were answered. The patient knows to call the clinic with any problems, questions or concerns. We can certainly see the patient much sooner if necessary. I spent 10 minutes counseling the patient face to face. The total time spent in the appointment was 15 minutes.   Disclaimer: This note was dictated with voice recognition software.  Similar sounding words can inadvertently be transcribed and may not be corrected upon review.

## 2018-12-08 ENCOUNTER — Ambulatory Visit (INDEPENDENT_AMBULATORY_CARE_PROVIDER_SITE_OTHER): Payer: BLUE CROSS/BLUE SHIELD | Admitting: Family Medicine

## 2018-12-08 ENCOUNTER — Encounter: Payer: Self-pay | Admitting: Family Medicine

## 2018-12-08 VITALS — BP 128/80 | HR 64 | Temp 97.6°F | Resp 12 | Ht 66.0 in | Wt 191.0 lb

## 2018-12-08 DIAGNOSIS — C9000 Multiple myeloma not having achieved remission: Secondary | ICD-10-CM

## 2018-12-08 DIAGNOSIS — Z0000 Encounter for general adult medical examination without abnormal findings: Secondary | ICD-10-CM

## 2018-12-08 DIAGNOSIS — E7800 Pure hypercholesterolemia, unspecified: Secondary | ICD-10-CM

## 2018-12-08 DIAGNOSIS — Z23 Encounter for immunization: Secondary | ICD-10-CM

## 2018-12-08 DIAGNOSIS — R413 Other amnesia: Secondary | ICD-10-CM | POA: Diagnosis not present

## 2018-12-08 LAB — LIPID PANEL
Cholesterol: 213 mg/dL — ABNORMAL HIGH (ref 0–200)
HDL: 35.8 mg/dL — AB (ref >39.00)
NonHDL: 176.88
TRIGLYCERIDES: 250 mg/dL — AB (ref 0.0–149.0)
Total CHOL/HDL Ratio: 6
VLDL: 50 mg/dL — ABNORMAL HIGH (ref 0.0–40.0)

## 2018-12-08 LAB — COMPREHENSIVE METABOLIC PANEL
ALK PHOS: 58 U/L (ref 39–117)
ALT: 27 U/L (ref 0–35)
AST: 24 U/L (ref 0–37)
Albumin: 4.1 g/dL (ref 3.5–5.2)
BILIRUBIN TOTAL: 0.5 mg/dL (ref 0.2–1.2)
BUN: 12 mg/dL (ref 6–23)
CO2: 23 mEq/L (ref 19–32)
Calcium: 9.1 mg/dL (ref 8.4–10.5)
Chloride: 106 mEq/L (ref 96–112)
Creatinine, Ser: 1.03 mg/dL (ref 0.40–1.20)
GFR: 64.45 mL/min (ref >60.00)
Glucose, Bld: 116 mg/dL — ABNORMAL HIGH (ref 70–99)
Potassium: 3.2 mEq/L — ABNORMAL LOW (ref 3.5–5.1)
Sodium: 139 mEq/L (ref 135–145)
Total Protein: 6.8 g/dL (ref 6.0–8.3)

## 2018-12-08 LAB — CBC WITH DIFFERENTIAL/PLATELET
BASOS ABS: 0.1 10*3/uL (ref 0.0–0.1)
Basophils Relative: 1.2 % (ref 0.0–3.0)
Eosinophils Absolute: 0.1 10*3/uL (ref 0.0–0.7)
Eosinophils Relative: 1.5 % (ref 0.0–5.0)
HCT: 39.4 % (ref 36.0–46.0)
Hemoglobin: 13.2 g/dL (ref 12.0–15.0)
Lymphocytes Relative: 43.3 % (ref 12.0–46.0)
Lymphs Abs: 2.2 10*3/uL (ref 0.7–4.0)
MCHC: 33.5 g/dL (ref 30.0–36.0)
MCV: 85.8 fl (ref 78.0–100.0)
Monocytes Absolute: 0.4 10*3/uL (ref 0.1–1.0)
Monocytes Relative: 7.2 % (ref 3.0–12.0)
Neutro Abs: 2.4 10*3/uL (ref 1.4–7.7)
Neutrophils Relative %: 46.8 % (ref 43.0–77.0)
Platelets: 194 10*3/uL (ref 150.0–400.0)
RBC: 4.59 Mil/uL (ref 3.87–5.11)
RDW: 13.7 % (ref 11.5–15.5)
WBC: 5 10*3/uL (ref 4.0–10.5)

## 2018-12-08 LAB — TSH: TSH: 4.49 u[IU]/mL (ref 0.35–4.50)

## 2018-12-08 LAB — LDL CHOLESTEROL, DIRECT: Direct LDL: 144 mg/dL

## 2018-12-08 NOTE — Patient Instructions (Signed)
Preventive Care 68 Years and Older, Female Preventive care refers to lifestyle choices and visits with your health care provider that can promote health and wellness. What does preventive care include?  A yearly physical exam. This is also called an annual well check.  Dental exams once or twice a year.  Routine eye exams. Ask your health care provider how often you should have your eyes checked.  Personal lifestyle choices, including: ? Daily care of your teeth and gums. ? Regular physical activity. ? Eating a healthy diet. ? Avoiding tobacco and drug use. ? Limiting alcohol use. ? Practicing safe sex. ? Taking low-dose aspirin every day. ? Taking vitamin and mineral supplements as recommended by your health care provider. What happens during an annual well check? The services and screenings done by your health care provider during your annual well check will depend on your age, overall health, lifestyle risk factors, and family history of disease. Counseling Your health care provider may ask you questions about your:  Alcohol use.  Tobacco use.  Drug use.  Emotional well-being.  Home and relationship well-being.  Sexual activity.  Eating habits.  History of falls.  Memory and ability to understand (cognition).  Work and work Statistician.  Reproductive health.  Screening You may have the following tests or measurements:  Height, weight, and BMI.  Blood pressure.  Lipid and cholesterol levels. These may be checked every 5 years, or more frequently if you are over 38 years old.  Skin check.  Lung cancer screening. You may have this screening every year starting at age 4 if you have a 30-pack-year history of smoking and currently smoke or have quit within the past 15 years.  Colorectal cancer screening. All adults should have this screening starting at age 54 and continuing until age 67. You will have tests every 1-10 years, depending on your results and the  type of screening test. People at increased risk should start screening at an earlier age. Screening tests may include: ? Guaiac-based fecal occult blood testing. ? Fecal immunochemical test (FIT). ? Stool DNA test. ? Virtual colonoscopy. ? Sigmoidoscopy. During this test, a flexible tube with a tiny camera (sigmoidoscope) is used to examine your rectum and lower colon. The sigmoidoscope is inserted through your anus into your rectum and lower colon. ? Colonoscopy. During this test, a long, thin, flexible tube with a tiny camera (colonoscope) is used to examine your entire colon and rectum.  Hepatitis C blood test.  Hepatitis B blood test.  Sexually transmitted disease (STD) testing.  Diabetes screening. This is done by checking your blood sugar (glucose) after you have not eaten for a while (fasting). You may have this done every 1-3 years.  Bone density scan. This is done to screen for osteoporosis. You may have this done starting at age 3.  Mammogram. This may be done every 1-2 years. Talk to your health care provider about how often you should have regular mammograms. Talk with your health care provider about your test results, treatment options, and if necessary, the need for more tests. Vaccines Your health care provider may recommend certain vaccines, such as:  Influenza vaccine. This is recommended every year.  Tetanus, diphtheria, and acellular pertussis (Tdap, Td) vaccine. You may need a Td booster every 10 years.  Varicella vaccine. You may need this if you have not been vaccinated.  Zoster vaccine. You may need this after age 75.  Measles, mumps, and rubella (MMR) vaccine. You may need at least  one dose of MMR if you were born in 1957 or later. You may also need a second dose.  Pneumococcal 13-valent conjugate (PCV13) vaccine. One dose is recommended after age 24.  Pneumococcal polysaccharide (PPSV23) vaccine. One dose is recommended after age 24.  Meningococcal  vaccine. You may need this if you have certain conditions.  Hepatitis A vaccine. You may need this if you have certain conditions or if you travel or work in places where you may be exposed to hepatitis A.  Hepatitis B vaccine. You may need this if you have certain conditions or if you travel or work in places where you may be exposed to hepatitis B.  Haemophilus influenzae type b (Hib) vaccine. You may need this if you have certain conditions. Talk to your health care provider about which screenings and vaccines you need and how often you need them. This information is not intended to replace advice given to you by your health care provider. Make sure you discuss any questions you have with your health care provider. Document Released: 10/13/2015 Document Revised: 11/06/2017 Document Reviewed: 07/18/2015 Elsevier Interactive Patient Education  2019 [employer].

## 2018-12-08 NOTE — Addendum Note (Signed)
Addended by: Elta Guadeloupe on: 12/08/2018 11:13 AM   Modules accepted: Orders

## 2018-12-08 NOTE — Progress Notes (Signed)
Subjective:     Victoria Gates is a 68 y.o. female and is here for a comprehensive physical exam. The patient reports problems - pt c/o of worsening memory problems .  This is a pleasant AA female with hx Multiple myeloma with worsening memory problems and she is requesting a neuro referral .    Social History   Socioeconomic History  . Marital status: Married    Spouse name: Not on file  . Number of children: Not on file  . Years of education: Not on file  . Highest education level: Not on file  Occupational History  . Occupation: Air traffic controller: [employer]  . Financial resource strain: Not on file  . Food insecurity:    Worry: Not on file    Inability: Not on file  . Transportation needs:    Medical: Not on file    Non-medical: Not on file  Tobacco Use  . Smoking status: Never Smoker  . Smokeless tobacco: Never Used  Substance and Sexual Activity  . Alcohol use: No  . Drug use: No  . Sexual activity: Yes  Lifestyle  . Physical activity:    Days per week: Not on file    Minutes per session: Not on file  . Stress: Not on file  Relationships  . Social connections:    Talks on phone: Not on file    Gets together: Not on file    Attends religious service: Not on file    Active member of club or organization: Not on file    Attends meetings of clubs or organizations: Not on file    Relationship status: Not on file  . Intimate partner violence:    Fear of current or ex partner: Not on file    Emotionally abused: Not on file    Physically abused: Not on file    Forced sexual activity: Not on file  Other Topics Concern  . Not on file  Social History Narrative   Lives in [place] with spouse.   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   Health Maintenance  Topic Date Due  . PNA vac Low Risk Adult (2 of 2 - PPSV23) 04/14/2018  . URINE MICROALBUMIN  09/01/2018  . HEMOGLOBIN A1C  03/03/2019  . OPHTHALMOLOGY EXAM  07/01/2019  .  FOOT EXAM  09/02/2019  . MAMMOGRAM  10/07/2020  . COLONOSCOPY  06/10/2022  . TETANUS/TDAP  09/02/2027  . INFLUENZA VACCINE  Completed  . DEXA SCAN  Completed  . Hepatitis C Screening  Completed    The following portions of the patient's history were reviewed and updated as appropriate: She  has a past medical history of ALLERGIC RHINITIS CAUSE UNSPECIFIED (01/02/2009), ANEMIA-IRON DEFICIENCY (04/24/2007), Bradycardia (04/29/2016), CERVICAL RADICULOPATHY, LEFT (02/01/2010), Chest pain (03/2012), COLONIC POLYPS, HX OF (04/24/2007), DM (03/18/2008), Encounter for antineoplastic chemotherapy (04/29/2016), ESOPHAGEAL STRICTURE (04/15/2007), GERD (09/21/2010), HERPES ZOSTER (11/05/2010), HIATAL HERNIA (04/15/2007), HYPERCHOLESTEROLEMIA (07/03/2009), Hypokalemia (11/30/2015), Multiple myeloma ([place]), and OSTEOPOROSIS (04/24/2007). She does not have any pertinent problems on file. She  has a past surgical history that includes Tubal ligation and Esophagogastroduodenoscopy (04/15/2007). Her family history includes Brain cancer in her mother; Diabetes in her brother and sister; Heart disease in her brother, father, mother, and sister; Lung cancer in her father. She  reports that she has never smoked. She has never used smokeless tobacco. She reports that she does not drink alcohol or use drugs. She has a current  medication list which includes the following prescription(s): vitamin c, calcium, cholecalciferol, and lansoprazole. Current Outpatient Medications on File Prior to Visit  Medication Sig Dispense Refill  . Ascorbic Acid (VITAMIN C) 1000 MG tablet Take 1,000 mg by mouth daily.     . Calcium 500-125 MG-UNIT TABS Take 1 tablet by mouth daily.      . cholecalciferol (VITAMIN D) 1000 UNITS tablet Take 1,000 Units by mouth daily.    . lansoprazole (PREVACID) 30 MG capsule Take 30 mg by mouth daily with breakfast.      No current facility-administered medications on file prior to visit.    She is allergic to  atorvastatin; rosuvastatin; and sulfonamide derivatives..  Review of Systems Review of Systems  Constitutional: Negative for activity change, appetite change and fatigue.  HENT: Negative for hearing loss, congestion, tinnitus and ear discharge.  dentist [id number]: Negative for visual disturbance (see optho q1y -- vision corrected to 20/20 with glasses).  Respiratory: Negative for cough, chest tightness and shortness of breath.   Cardiovascular: Negative for chest pain, palpitations and leg swelling.  Gastrointestinal: Negative for abdominal pain, diarrhea, constipation and abdominal distention.  Genitourinary: Negative for urgency, frequency, decreased urine volume and difficulty urinating.  Musculoskeletal: Negative for back pain, arthralgias and gait problem.  Skin: Negative for color change, pallor and rash.  Neurological: Negative for dizziness, light-headedness, numbness and headaches.  Hematological: Negative for adenopathy. Does not bruise/bleed easily.  Psychiatric/Behavioral: Negative for suicidal ideas,, sleep disturbance, self-injury, dysphoric mood, decreased concentration and agitation.  memory problems      Objective:    BP 128/80 (BP Location: Left Arm, Patient Position: Sitting, Cuff Size: Normal)   Pulse 64   Temp 97.6 F (36.4 C)   Resp 12   Ht '5\' 6"'$  (1.676 m)   Wt 191 lb (86.6 kg)   SpO2 98%   BMI 30.83 kg/m  General appearance: alert, cooperative, appears stated age and no distress Head: Normocephalic, without obvious abnormality, atraumatic Eyes: conjunctivae/corneas clear. PERRL, EOM's intact. Fundi benign. Ears: normal TM's and external ear canals both ears Nose: Nares normal. Septum midline. Mucosa normal. No drainage or sinus tenderness. Throat: lips, mucosa, and tongue normal; teeth and gums normal Neck: no adenopathy, no carotid bruit, no JVD, supple, symmetrical, trachea midline and thyroid not enlarged, symmetric, no tenderness/mass/nodules Back:  symmetric, no curvature. ROM normal. No CVA tenderness. Lungs: clear to auscultation bilaterally Breasts: normal appearance, no masses or tenderness Heart: regular rate and rhythm, S1, S2 normal, no murmur, click, rub or gallop Abdomen: soft, non-tender; bowel sounds normal; no masses,  no organomegaly Pelvic: not indicated; post-menopausal, no abnormal Pap smears in past Extremities: extremities normal, atraumatic, no cyanosis or edema Pulses: 2+ and symmetric Skin: Skin color, texture, turgor normal. No rashes or lesions Lymph nodes: Cervical, supraclavicular, and axillary nodes normal. Neurologic: Alert and oriented X 3, normal strength and tone. Normal symmetric reflexes. Normal coordination and gait    Assessment:    Healthy female exam.      Plan:    ghm utd Check labs  See After Visit Summary for Counseling Recommendations    1. Memory loss  - Ambulatory referral to Neurology  2. Preventative health care See above - CBC with Differential/Platelet - Lipid panel - TSH - Comprehensive metabolic panel

## 2018-12-08 NOTE — Assessment & Plan Note (Signed)
Per oncology °

## 2018-12-09 ENCOUNTER — Encounter: Payer: Self-pay | Admitting: Neurology

## 2018-12-10 ENCOUNTER — Other Ambulatory Visit: Payer: Self-pay | Admitting: Family Medicine

## 2018-12-10 DIAGNOSIS — E785 Hyperlipidemia, unspecified: Principal | ICD-10-CM

## 2018-12-10 DIAGNOSIS — E1169 Type 2 diabetes mellitus with other specified complication: Secondary | ICD-10-CM

## 2018-12-14 ENCOUNTER — Telehealth: Payer: Self-pay

## 2018-12-14 MED ORDER — PITAVASTATIN CALCIUM 1 MG PO TABS: 1.0000 mg | ORAL_TABLET | Freq: Every day | ORAL | 2 refills | Status: DC

## 2018-12-14 NOTE — Addendum Note (Signed)
Addended byDamita Dunnings D on: 12/14/2018 09:23 AM   Modules accepted: Orders

## 2018-12-14 NOTE — Telephone Encounter (Signed)
PA initiated via Covermymeds; KEY: [id number]. PA approved.   [id number];VQQVZD:[id number];Review Type:Prior Auth;Coverage Start Date:11/14/2018;Coverage End Date:12/14/2019;

## 2019-01-13 ENCOUNTER — Other Ambulatory Visit: Payer: Self-pay

## 2019-01-14 NOTE — Progress Notes (Signed)
New Patient Virtual Visit via Video Note The purpose of this virtual visit is to provide medical care while limiting exposure to the novel coronavirus.    Consent was obtained for video visit:  Yes.   Answered questions that patient had about telehealth interaction:  Yes.   I discussed the limitations, risks, security and privacy concerns of performing an evaluation and management service by telemedicine. I also discussed with the patient that there may be a patient responsible charge related to this service. The patient expressed understanding and agreed to proceed.  Pt location: Home Physician Location: office Name of referring provider:  Ann Held, * I connected with Sherlie Ban at patients initiation/request on 01/15/2019 at  2:30 PM EDT by video enabled telemedicine application and verified that I am speaking with the correct person using two identifiers. Pt MRN:  [id number] Pt DOB:  [birth year] Video Participants:  Sherlie Ban    History of Present Illness: Victoria Gates is a 68 y.o. right-handed African American female with diabetes mellitus, multiple myeloma (2015) presenting for evaluation of memory problems.  She is a very poor historian and has inappropriate laughter throughout the entire visit.  From what I can gather, she has been having problems with forgetfulness over the past several years, especially noticeable after being treated for multiple myeloma.  She is repeating herself and often forgets day-to-day tasks.  She is able to manage finances, take her medications without prompting, and able to do household chores. She drives and is not getting lost.  She works as the [employer] in [employer].   She lives at home with her husband, who is not available to get more details from.  She denies any changes in mood or sleep.     Out-side paper records, electronic medical record, and images have been reviewed where available and summarized as:  Lab Results   Component Value Date   HGBA1C 5.2 09/01/2018   Lab Results  Component Value Date   TSH 4.49 12/08/2018   Lab Results  Component Value Date   ESRSEDRATE 17 09/21/2010    Past Medical History:  Diagnosis Date   ALLERGIC RHINITIS CAUSE UNSPECIFIED 01/02/2009   ANEMIA-IRON DEFICIENCY 04/24/2007   Bradycardia 04/29/2016   CERVICAL RADICULOPATHY, LEFT 02/01/2010   Chest pain 03/2012   a. 04/15/2012 Ex MV: Ex time 10 mins, EF 63%, no ischemia/infarct.  Occas PAC's/PVC's.   COLONIC POLYPS, HX OF 04/24/2007   hyperplastic only   DM 03/18/2008   Encounter for antineoplastic chemotherapy 04/29/2016   ESOPHAGEAL STRICTURE 04/15/2007   s/p dilitation   GERD 09/21/2010   HERPES ZOSTER 11/05/2010   HIATAL HERNIA 04/15/2007   HYPERCHOLESTEROLEMIA 07/03/2009   Hypokalemia 11/30/2015   Multiple myeloma ([place])    OSTEOPOROSIS 04/24/2007    Past Surgical History:  Procedure Laterality Date   ESOPHAGOGASTRODUODENOSCOPY  04/15/2007   TUBAL LIGATION       Medications:  Outpatient Encounter Medications as of 01/15/2019  Medication Sig   Ascorbic Acid (VITAMIN C) 1000 MG tablet Take 1,000 mg by mouth daily.    Calcium 500-125 MG-UNIT TABS Take 1 tablet by mouth daily.     cholecalciferol (VITAMIN D) 1000 UNITS tablet Take 1,000 Units by mouth daily.   lansoprazole (PREVACID) 30 MG capsule Take 30 mg by mouth daily with breakfast.    Pitavastatin Calcium (LIVALO) 1 MG TABS Take 1 tablet (1 mg total) by mouth at bedtime.   No facility-administered encounter medications on file as  of 01/15/2019.     Allergies:  Allergies  Allergen Reactions   Atorvastatin     REACTION: myalgias   Rosuvastatin     REACTION: myalgias   Sulfonamide Derivatives     Unknown reaction     Family History: Family History  Problem Relation Age of Onset   Brain cancer Mother    Heart disease Mother    Lung cancer Father    Heart disease Father    Diabetes Sister    Heart disease Sister      Diabetes Brother    Heart disease Brother    Cancer Neg Hx        No FH of Colon Cancer   Colon cancer Neg Hx    Esophageal cancer Neg Hx    Rectal cancer Neg Hx    Stomach cancer Neg Hx     Social History: Social History   Tobacco Use   Smoking status: Never Smoker   Smokeless tobacco: Never Used  Substance Use Topics   Alcohol use: No   Drug use: No   Social History   Social History Narrative   [place]ives in [place].   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    Review of Systems:  CONSTITUTIONAL: No fevers, chills, night sweats, or weight loss.   EYES: No visual changes or eye pain ENT: No hearing changes.  No history of nose bleeds.   RESPIRATORY: No cough, wheezing and shortness of breath.   CARDIOVASCULAR: Negative for chest pain, and palpitations.   GI: Negative for abdominal discomfort, blood in stools or black stools.  No recent change in bowel habits.   GU:  No history of incontinence.   MUSCLOSKELETAL: No history of joint pain or swelling.  No myalgias.   SKIN: Negative for lesions, rash, and itching.   HEMATOLOGY/ONCOLOGY: Negative for prolonged bleeding, bruising easily, and swollen nodes.  No history of cancer.  ENDOCRINE: Negative for cold or heat intolerance, polydipsia or goiter.   PSYCH:  No depression or anxiety symptoms.   NEURO: As Above.     General Medical Exam:   Appears comfortable.  Neurological Exam: MENTAL STATUS including orientation to time, place, person, recent and remote memory is poor.  Attention and concentration is poor.  She does not answer questions completely and repeats "I don't know, I can't remember".  She is inappropriately laughing out loud throughout the visit. During [facility] testing, she again was laughing throughout visit, interrupting with poor effort.   Montreal Cognitive Assessment - modified for e-visit 01/14/2019  Attention: Read list of digits (0/2) 2  Attention: Read list of letters  (0/1) 0  Attention: Serial 7 subtraction starting at 100 (0/3) 3  Language: Repeat phrase (0/2) 0  Language : Fluency (0/1) 0  Abstraction (0/2) 0  Delayed Recall (0/5) 3  Orientation (0/6) 6  Total 14  Adjusted Score (based on education) 15   Speech is not dysarthric.  CRANIAL NERVES:  Normal conjugate, extra-ocular eye movements in all directions of gaze.  No ptosis.  Normal facial symmetry and movements.   MOTOR:  Antigravity in all extremities.   No pronator drift.   COORDINATION/GAIT: Normal finger to nose bilaterally.  Intact rapid alternating movements bilaterally. Gait mild-wide based, stable.    IMPRESSION: Mrs. Clute is a 68 year-old female with history of multiple myeloma referred for evaluation of cognitive impairment.  Her history and exam is very limited due to patient being a poor historian and lack of  cooperation/effort.  MOCA testing showed deficits in all domains, except orientation and she did fairly well on delayed recall, also.  Her inappropriate laugher and bizarre affect raise possibility or frontotemporal dementia vs psychiatric condition.  Formal neurocognitive testing will be ordered to characterize the nature of his memory problems.  I will try to contact her husband for more information about his wife's behavior and memory impairment.  To look for structural pathology, MRI brain wwo contrast will be ordered.  Check vitamin B12 for vitamin deficiency.     Follow Up Instructions:  I discussed the assessment and treatment plan with the patient. The patient was provided an opportunity to ask questions and all were answered. The patient agreed with the plan and demonstrated an understanding of the instructions.   The patient was advised to call back or seek an in-person evaluation if the symptoms worsen or if the condition fails to improve as anticipated.  Return to clinic after testing   Total Time spent:  45 min   [place], DO

## 2019-01-15 ENCOUNTER — Other Ambulatory Visit: Payer: Self-pay

## 2019-01-15 ENCOUNTER — Other Ambulatory Visit: Payer: Self-pay | Admitting: *Deleted

## 2019-01-15 ENCOUNTER — Encounter: Payer: Self-pay | Admitting: *Deleted

## 2019-01-15 ENCOUNTER — Telehealth (INDEPENDENT_AMBULATORY_CARE_PROVIDER_SITE_OTHER): Payer: BLUE CROSS/BLUE SHIELD | Admitting: Neurology

## 2019-01-15 DIAGNOSIS — R4189 Other symptoms and signs involving cognitive functions and awareness: Secondary | ICD-10-CM

## 2019-01-15 DIAGNOSIS — R4689 Other symptoms and signs involving appearance and behavior: Secondary | ICD-10-CM | POA: Diagnosis not present

## 2019-01-15 DIAGNOSIS — R413 Other amnesia: Secondary | ICD-10-CM

## 2019-01-15 NOTE — Progress Notes (Signed)
Order placed

## 2019-02-04 ENCOUNTER — Other Ambulatory Visit: Payer: Self-pay | Admitting: Family Medicine

## 2019-02-10 ENCOUNTER — Telehealth: Payer: Self-pay | Admitting: Neurology

## 2019-02-10 ENCOUNTER — Ambulatory Visit: Payer: BLUE CROSS/BLUE SHIELD | Admitting: Neurology

## 2019-02-10 NOTE — Telephone Encounter (Signed)
Patient's son called to give you an update on his mother. Seh had an E visit on 01/15/19.  Please Call. Thanks

## 2019-02-11 ENCOUNTER — Other Ambulatory Visit: Payer: Self-pay

## 2019-02-11 DIAGNOSIS — R413 Other amnesia: Secondary | ICD-10-CM

## 2019-02-11 DIAGNOSIS — R4689 Other symptoms and signs involving appearance and behavior: Secondary | ICD-10-CM

## 2019-02-11 DIAGNOSIS — R4189 Other symptoms and signs involving cognitive functions and awareness: Secondary | ICD-10-CM

## 2019-02-11 NOTE — Telephone Encounter (Signed)
I spoke with patient's husband, Jenille Laszlo, who reported that patient is having change in behavior over the past 6 months, getting lost when driving, and not completing usual tasks.  When asked about her inappropriate laughter, he says that she has always been like this, but moreso after completing chemotherapy.   I do not recommend medications until we have a better understanding what is causing this acute change in cognition.    MRI brain wwo contrast has been ordered, we will follow-up to get it scheduled.    She will also need lab testing - check ESR, CRP, vitamin B12, folate, vitamin B1, heavy metal screen, copper.  Additional testing may be indicated based on the results of the above.   Also, patient's son is not on DPR.  This will need to be updated in order to talk to son directly.    Antrell Tipler K. Posey Pronto, DO

## 2019-02-11 NOTE — Telephone Encounter (Signed)
I spoke with Dr. Clotilde Dieter. His main concern is if pt should start on Aricept sooner then later.  He feels that his mom has had more episodes of confusion. States that pt did not recognize his wife recently.  MRI has been ordered but not scheduled at this time. Caryl Pina Driver placed that.

## 2019-02-11 NOTE — Telephone Encounter (Signed)
Spoke with pts husband Chrissie Noa). He was made aware that Dr. Posey Pronto does not want pt to take Aricept at this time.  MRI needs to be performed along with labs. Orders have been placed. Husband made aware that on the day of MRI pt needs to come to our office and check in then go to the 2nd floor for labs.  ROI form mailed to home address so son could be added as someone we could speak to about pts health.

## 2019-02-11 NOTE — Telephone Encounter (Signed)
Patient son Dr Rosemarie Ax called and would like to speak to someone about his mother. She is having problem with her memory and confusion  Please call

## 2019-02-16 ENCOUNTER — Ambulatory Visit: Payer: BLUE CROSS/BLUE SHIELD | Admitting: Neurology

## 2019-02-16 ENCOUNTER — Other Ambulatory Visit: Payer: Self-pay

## 2019-02-16 ENCOUNTER — Telehealth: Payer: Self-pay | Admitting: Neurology

## 2019-02-16 ENCOUNTER — Inpatient Hospital Stay: Payer: BC Managed Care – PPO | Attending: Internal Medicine

## 2019-02-16 DIAGNOSIS — Z9221 Personal history of antineoplastic chemotherapy: Secondary | ICD-10-CM | POA: Diagnosis not present

## 2019-02-16 DIAGNOSIS — Z79899 Other long term (current) drug therapy: Secondary | ICD-10-CM | POA: Insufficient documentation

## 2019-02-16 DIAGNOSIS — C9000 Multiple myeloma not having achieved remission: Secondary | ICD-10-CM | POA: Diagnosis present

## 2019-02-16 LAB — CMP (CANCER CENTER ONLY)
ALT: 26 U/L (ref 0–44)
AST: 24 U/L (ref 15–41)
Albumin: 3.5 g/dL (ref 3.5–5.0)
Alkaline Phosphatase: 61 U/L (ref 38–126)
Anion gap: 9 (ref 5–15)
BUN: 9 mg/dL (ref 8–23)
CO2: 25 mmol/L (ref 22–32)
Calcium: 8.9 mg/dL (ref 8.9–10.3)
Chloride: 109 mmol/L (ref 98–111)
Creatinine: 1.07 mg/dL — ABNORMAL HIGH (ref 0.44–1.00)
GFR, Est AFR Am: >60 mL/min (ref >60)
GFR, Estimated: 53 mL/min — ABNORMAL LOW (ref >60)
Glucose, Bld: 112 mg/dL — ABNORMAL HIGH (ref 70–99)
Potassium: 3.3 mmol/L — ABNORMAL LOW (ref 3.5–5.1)
Sodium: 143 mmol/L (ref 135–145)
Total Bilirubin: 0.4 mg/dL (ref 0.3–1.2)
Total Protein: 6.9 g/dL (ref 6.5–8.1)

## 2019-02-16 LAB — CBC WITH DIFFERENTIAL (CANCER CENTER ONLY)
Abs Immature Granulocytes: 0.01 10*3/uL (ref 0.00–0.07)
Basophils Absolute: 0.1 10*3/uL (ref 0.0–0.1)
Basophils Relative: 1 %
Eosinophils Absolute: 0.1 10*3/uL (ref 0.0–0.5)
Eosinophils Relative: 2 %
HCT: 37.9 % (ref 36.0–46.0)
Hemoglobin: 12.4 g/dL (ref 12.0–15.0)
Immature Granulocytes: 0 %
Lymphocytes Relative: 44 %
Lymphs Abs: 2.6 10*3/uL (ref 0.7–4.0)
MCH: 28.6 pg (ref 26.0–34.0)
MCHC: 32.7 g/dL (ref 30.0–36.0)
MCV: 87.5 fL (ref 80.0–100.0)
Monocytes Absolute: 0.5 10*3/uL (ref 0.1–1.0)
Monocytes Relative: 8 %
Neutro Abs: 2.8 10*3/uL (ref 1.7–7.7)
Neutrophils Relative %: 45 %
Platelet Count: 164 10*3/uL (ref 150–400)
RBC: 4.33 MIL/uL (ref 3.87–5.11)
RDW: 13.2 % (ref 11.5–15.5)
WBC Count: 6 10*3/uL (ref 4.0–10.5)
nRBC: 0 % (ref 0.0–0.2)

## 2019-02-16 LAB — LACTATE DEHYDROGENASE: LDH: 146 U/L (ref 98–192)

## 2019-02-16 NOTE — Telephone Encounter (Signed)
Chrissie Noa was calling in on this patient stating that they haven't heard from [facility] about MRI or lab work. Please call him back at [phone]. Thanks!

## 2019-02-16 NOTE — Telephone Encounter (Signed)
Left message for Chrissie Noa (husband) to return call. He can call [phone] ([facility]) to schedule MRI. For the labs pt comes here to check in then go to 2nd flood. Anyone can address this.

## 2019-02-17 ENCOUNTER — Telehealth: Payer: Self-pay | Admitting: Neurology

## 2019-02-17 LAB — IGG, IGA, IGM
IgA: 329 mg/dL (ref 87–352)
IgG (Immunoglobin G), Serum: 1285 mg/dL (ref 586–1602)
IgM (Immunoglobulin M), Srm: 39 mg/dL (ref 26–217)

## 2019-02-17 LAB — BETA 2 MICROGLOBULIN, SERUM: Beta-2 Microglobulin: 1.7 mg/L (ref 0.6–2.4)

## 2019-02-17 NOTE — Telephone Encounter (Signed)
Pts husband Chrissie Noa informed. He will call [facility] and knows to come here for labs.

## 2019-02-17 NOTE — Telephone Encounter (Signed)
Pt has an appt with [facility] for MRI of Brain 03/08/19 at 9:20. Husband Chrissie Noa) called to inform.

## 2019-02-18 LAB — KAPPA/LAMBDA LIGHT CHAINS
Kappa free light chain: 15.8 mg/L (ref 3.3–19.4)
Kappa, lambda light chain ratio: 1.14 (ref 0.26–1.65)
Lambda free light chains: 13.8 mg/L (ref 5.7–26.3)

## 2019-02-23 ENCOUNTER — Encounter: Payer: Self-pay | Admitting: Internal Medicine

## 2019-02-23 ENCOUNTER — Other Ambulatory Visit: Payer: Self-pay

## 2019-02-23 ENCOUNTER — Inpatient Hospital Stay (HOSPITAL_BASED_OUTPATIENT_CLINIC_OR_DEPARTMENT_OTHER): Payer: BC Managed Care – PPO | Admitting: Internal Medicine

## 2019-02-23 VITALS — BP 133/85 | HR 68 | Temp 97.8°F | Resp 18 | Ht 66.0 in | Wt 195.5 lb

## 2019-02-23 DIAGNOSIS — Z9221 Personal history of antineoplastic chemotherapy: Secondary | ICD-10-CM

## 2019-02-23 DIAGNOSIS — C9000 Multiple myeloma not having achieved remission: Secondary | ICD-10-CM

## 2019-02-23 NOTE — Progress Notes (Signed)
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  PRIOR THERAPY: 1) Systemic chemotherapy with Carfilzomib, Cytoxan and dexamethasone. First dose on 11/15/2013. Status post 5 cycles. 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 3) Maintenance Revlimid 10 mg by mouth daily. Status post 2 months of treatment. 4) Maintenance Revlimid 15 mg by mouth daily. Status post 4 months of treatment. 5) Maintenance treatment with Revlimid 10 mg by mouth daily status post 17 months. She completed 2 years of treatment in February 2018.  CURRENT THERAPY: Observation.  INTERVAL HISTORY: Victoria Gates 68 y.o. female returns to the clinic today for follow-up visit.  The patient is feeling fine today with no concerning complaints.  She denied having any chest pain, shortness of breath, cough or hemoptysis.  She denied having any fever or chills.  She has no nausea, vomiting, diarrhea or constipation.  She denied having any recent weight loss or night sweats.  She gained around 5 pounds since her last visit.  The patient had repeat myeloma panel performed recently and she is here for evaluation and discussion of her lab results.  MEDICAL HISTORY: Past Medical History:  Diagnosis Date  . ALLERGIC RHINITIS CAUSE UNSPECIFIED 01/02/2009  . ANEMIA-IRON DEFICIENCY 04/24/2007  . Bradycardia 04/29/2016  . CERVICAL RADICULOPATHY, LEFT 02/01/2010  . Chest pain 03/2012   a. 04/15/2012 Ex MV: Ex time 10 mins, EF 63%, no ischemia/infarct.  Occas PAC's/PVC's.  . COLONIC POLYPS, HX OF 04/24/2007   hyperplastic only  . DM 03/18/2008  . Encounter for antineoplastic chemotherapy 04/29/2016  . ESOPHAGEAL STRICTURE 04/15/2007   s/p dilitation  . GERD 09/21/2010  . 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 HIATAL HERNIA  04/15/2007  . HYPERCHOLESTEROLEMIA 07/03/2009  . Hypokalemia 11/30/2015  . Multiple myeloma ([place])   . OSTEOPOROSIS 04/24/2007    ALLERGIES:  is allergic to atorvastatin; rosuvastatin; and sulfonamide derivatives.  MEDICATIONS:  Current Outpatient Medications  Medication Sig Dispense Refill  . Ascorbic Acid (VITAMIN C) 1000 MG tablet Take 1,000 mg by mouth daily.     . Calcium 500-125 MG-UNIT TABS Take 1 tablet by mouth daily.      . cholecalciferol (VITAMIN D) 1000 UNITS tablet Take 1,000 Units by mouth daily.    . lansoprazole (PREVACID) 30 MG capsule Take 30 mg by mouth daily with breakfast.     . LIVALO 1 MG TABS TAKE 1 TABLET (1 MG TOTAL) BY MOUTH AT BEDTIME. 30 tablet 2   No current facility-administered medications for this visit.     SURGICAL HISTORY:  Past Surgical History:  Procedure Laterality Date  . ESOPHAGOGASTRODUODENOSCOPY  04/15/2007  . TUBAL LIGATION      REVIEW OF SYSTEMS:  A comprehensive review of systems was negative.   PHYSICAL EXAMINATION: General appearance: alert, cooperative and no distress Head: Normocephalic, without obvious abnormality, atraumatic Neck: no adenopathy, no JVD, supple, symmetrical, trachea midline and thyroid not enlarged, symmetric, no tenderness/mass/nodules Lymph nodes: Cervical, supraclavicular, and axillary nodes normal. Resp: clear to auscultation bilaterally Back: symmetric, no curvature. ROM normal. No CVA tenderness. Cardio: regular rate and rhythm, S1, S2 normal, no murmur, click, rub or gallop GI: soft, non-tender; bowel sounds normal; no masses,  no organomegaly Extremities: extremities normal, atraumatic, no cyanosis or edema  ECOG PERFORMANCE STATUS: 0 -  Asymptomatic  Blood pressure 133/85, pulse 68, temperature 97.8 F (36.6 C), temperature source Oral, resp. rate 18, height _0  (1.676 m), weight 195 lb 8 oz (88.7 kg), SpO2 100 %.  LABORATORY DATA: Lab Results  Component Value Date   WBC 6.0 02/16/2019   HGB 12.4  02/16/2019   HCT 37.9 02/16/2019   MCV 87.5 02/16/2019   PLT 164 02/16/2019      Chemistry      Component Value Date/Time   NA 143 02/16/2019 0750   NA 139 08/13/2017 0816   K 3.3 (L) 02/16/2019 0750   K 3.2 (L) 08/13/2017 0816   CL 109 02/16/2019 0750   CO2 25 02/16/2019 0750   CO2 26 08/13/2017 0816   BUN 9 02/16/2019 0750   BUN 9.0 08/13/2017 0816   CREATININE 1.07 (H) 02/16/2019 0750   CREATININE 1.0 08/13/2017 0816      Component Value Date/Time   CALCIUM 8.9 02/16/2019 0750   CALCIUM 9.2 08/13/2017 0816   ALKPHOS 61 02/16/2019 0750   ALKPHOS 59 08/13/2017 0816   AST 24 02/16/2019 0750   AST 22 08/13/2017 0816   ALT 26 02/16/2019 0750   ALT 17 08/13/2017 0816   BILITOT 0.4 02/16/2019 0750   BILITOT 0.80 08/13/2017 0816       RADIOGRAPHIC STUDIES: No results found.  ASSESSMENT AND PLAN:  This is a very pleasant 68 years old African-American female with multiple myeloma, IgA subtype status post induction systemic chemotherapy with Carfilzomib, Cytoxan and dexamethasone followed by peripheral blood autologous stem cell transplant followed by 2 years maintenance of treatment with Revlimid. She is currently on observation and feeling fine. She had repeat myeloma panel performed recently.  I discussed the lab results with the patient today. Her myeloma panel showed no concerning findings for disease progression. I recommended for her to continue on observation with repeat myeloma panel in 4 months. She was advised to call immediately if she has any concerning symptoms in the interval. The patient voices understanding of current disease status and treatment options and is in agreement with the current care plan. All questions were answered. The patient knows to call the clinic with any problems, questions or concerns. We can certainly see the patient much sooner if necessary. I spent 10 minutes counseling the patient face to face. The total time spent in the appointment  was 15 minutes.   Disclaimer: This note was dictated with voice recognition software. Similar sounding words can inadvertently be transcribed and may not be corrected upon review.

## 2019-02-25 ENCOUNTER — Telehealth: Payer: Self-pay | Admitting: Internal Medicine

## 2019-02-25 NOTE — Telephone Encounter (Signed)
Called blocked unable to reach pt - sent reminder letter per 5/26 los.

## 2019-03-08 ENCOUNTER — Ambulatory Visit: Payer: BLUE CROSS/BLUE SHIELD

## 2019-03-08 ENCOUNTER — Ambulatory Visit
Admission: RE | Admit: 2019-03-08 | Discharge: 2019-03-08 | Disposition: A | Discharge status: Home / Self Care | Payer: BC Managed Care – PPO | Source: Ambulatory Visit | Attending: Neurology | Admitting: Neurology

## 2019-03-08 ENCOUNTER — Other Ambulatory Visit: Payer: BC Managed Care – PPO

## 2019-03-08 ENCOUNTER — Encounter: Payer: Self-pay | Admitting: Endocrinology

## 2019-03-08 ENCOUNTER — Other Ambulatory Visit: Payer: Self-pay

## 2019-03-08 ENCOUNTER — Ambulatory Visit (INDEPENDENT_AMBULATORY_CARE_PROVIDER_SITE_OTHER): Payer: BC Managed Care – PPO | Admitting: Endocrinology

## 2019-03-08 VITALS — BP 128/88 | HR 84 | Temp 98.1°F | Wt 197.2 lb

## 2019-03-08 DIAGNOSIS — R609 Edema, unspecified: Secondary | ICD-10-CM

## 2019-03-08 DIAGNOSIS — E119 Type 2 diabetes mellitus without complications: Secondary | ICD-10-CM

## 2019-03-08 DIAGNOSIS — R4189 Other symptoms and signs involving cognitive functions and awareness: Secondary | ICD-10-CM

## 2019-03-08 DIAGNOSIS — R413 Other amnesia: Secondary | ICD-10-CM

## 2019-03-08 LAB — POCT GLYCOSYLATED HEMOGLOBIN (HGB A1C): Hemoglobin A1C: 6 % — AB (ref 4.0–5.6)

## 2019-03-08 MED ORDER — GADOBENATE DIMEGLUMINE 529 MG/ML IV SOLN
20.0000 mL | Freq: Once | INTRAVENOUS | Status: AC | PRN
  Administered 2019-03-08: 20 mL via INTRAVENOUS

## 2019-03-08 NOTE — Patient Instructions (Addendum)
good diet and exercise significantly improve the control of your diabetes.  You don't need any medication for the blood sugar now.   Please come back for a follow-up appointment in 6 months.

## 2019-03-08 NOTE — Progress Notes (Signed)
Subjective:    Patient ID: Victoria Gates, female    DOB: [birth year], 68 y.o.   MRN: [id number]  HPI Pt returns for f/u of diabetes mellitus:  DM type: 2 Dx'ed: 2952 Complications: renal insuff Therapy: no medication now.   GDM: never DKA: never Severe hypoglycemia: never Pancreatitis: never Other: she has never been on insulin.   Interval history: pt states she feels well in general.   Past Medical History:  Diagnosis Date  . ALLERGIC RHINITIS CAUSE UNSPECIFIED 01/02/2009  . ANEMIA-IRON DEFICIENCY 04/24/2007  . Bradycardia 04/29/2016  . CERVICAL RADICULOPATHY, LEFT 02/01/2010  . Chest pain 03/2012   a. 04/15/2012 Ex MV: Ex time 10 mins, EF 63%, no ischemia/infarct.  Occas PAC's/PVC's.  . COLONIC POLYPS, HX OF 04/24/2007   hyperplastic only  . DM 03/18/2008  . Encounter for antineoplastic chemotherapy 04/29/2016  . ESOPHAGEAL STRICTURE 04/15/2007   s/p dilitation  . GERD 09/21/2010  . 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 HIATAL HERNIA 04/15/2007  . HYPERCHOLESTEROLEMIA 07/03/2009  . Hypokalemia 11/30/2015  . Multiple myeloma ([place])   . OSTEOPOROSIS 04/24/2007    Past Surgical History:  Procedure Laterality Date  . ESOPHAGOGASTRODUODENOSCOPY  04/15/2007  . TUBAL LIGATION      Social History   Socioeconomic History  . Marital status: Married    Spouse name: Not on file  . Number of children: Not on file  . Years of education: Not on file  . Highest education level: Not on file  Occupational History  . Occupation: Air traffic controller: [employer]  . Financial resource strain: Not on file  . Food insecurity:    Worry: Not on file    Inability: Not on file  . Transportation needs:    Medical: Not on file    Non-medical: Not on file  Tobacco Use  . Smoking status: Never Smoker  . Smokeless tobacco: Never Used  Substance and Sexual Activity  . Alcohol use: No  . Drug use: No  . Sexual activity: Yes  Lifestyle  . Physical activity:    Days per week: Not on  file    Minutes per session: Not on file  . Stress: Not on file  Relationships  . Social connections:    Talks on phone: Not on file    Gets together: Not on file    Attends religious service: Not on file    Active member of club or organization: Not on file    Attends meetings of clubs or organizations: Not on file    Relationship status: Not on file  . Intimate partner violence:    Fear of current or ex partner: Not on file    Emotionally abused: Not on file    Physically abused: Not on file    Forced sexual activity: Not on file  Other Topics Concern  . Not on file  Social History Narrative   Lives in [place] with spouse.   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    Current Outpatient Medications on File Prior to Visit  Medication Sig Dispense Refill  . Ascorbic Acid (VITAMIN C) 1000 MG tablet Take 1,000 mg by mouth daily.     . Calcium 500-125 MG-UNIT TABS Take 1 tablet by mouth daily.      . cholecalciferol (VITAMIN D) 1000 UNITS tablet Take 1,000 Units by mouth daily.    . lansoprazole (PREVACID) 30 MG capsule Take 30 mg by mouth daily with breakfast.     .  LIVALO 1 MG TABS TAKE 1 TABLET (1 MG TOTAL) BY MOUTH AT BEDTIME. 30 tablet 2   No current facility-administered medications on file prior to visit.     Allergies  Allergen Reactions  . Atorvastatin     REACTION: myalgias  . Rosuvastatin     REACTION: myalgias  . Sulfonamide Derivatives     Unknown reaction     Family History  Problem Relation Age of Onset  . Brain cancer Mother   . Heart disease Mother   . Lung cancer Father   . Heart disease Father   . Diabetes Sister   . Heart disease Sister   . Diabetes Brother   . Heart disease Brother   . Cancer Neg Hx        No FH of Colon Cancer  . Colon cancer Neg Hx   . Esophageal cancer Neg Hx   . Rectal cancer Neg Hx   . Stomach cancer Neg Hx     BP 128/88 (BP Location: Left Arm, Patient Position: Sitting, Cuff Size: Normal)   Pulse 84    Temp 98.1 F (36.7 C) (Oral)   Wt 197 lb 3.2 oz (89.4 kg)   SpO2 95%   BMI 31.83 kg/m    Review of Systems Denies leg swelling.  She has gained weight.      Objective:   Physical Exam VITAL SIGNS:  See vs page GENERAL: no distress Pulses: dorsalis pedis intact bilat.   MSK: no deformity of the feet CV: trace bilat leg edema Skin:  no ulcer on the feet, but the skin is dry.  normal color and temp on the feet.   Neuro: sensation is intact to touch on the feet.    Lab Results  Component Value Date   HGBA1C 6.0 (A) 03/08/2019   Lab Results  Component Value Date   CREATININE 1.07 (H) 02/16/2019   BUN 9 02/16/2019   NA 143 02/16/2019   K 3.3 (L) 02/16/2019   CL 109 02/16/2019   CO2 25 02/16/2019       Assessment & Plan:  Type 2 DM: stable. Edema: This limits rx options  Patient Instructions  good diet and exercise significantly improve the control of your diabetes.  You don't need any medication for the blood sugar now.   Please come back for a follow-up appointment in 6 months.

## 2019-03-11 ENCOUNTER — Telehealth: Payer: Self-pay | Admitting: Neurology

## 2019-03-11 MED ORDER — DONEPEZIL HCL 5 MG PO TABS: ORAL_TABLET | ORAL | 2 refills | Status: DC

## 2019-03-11 NOTE — Telephone Encounter (Signed)
I will get her on the list for [facility]

## 2019-03-11 NOTE — Telephone Encounter (Signed)
Results of MRI brain discussed with patient's husband.  MRI shows cerebral atrophy, primarily in the temporal lobes bilaterally.  No evidence of abnormal white matter changes or enhancement.  With her memory and behavior changes, FTD continues to be in the differential.  I will have her return to the office for neuropsychiatric testing.  In the meantime, start Aricept 5mg  at bedtime x 30 days, then increase to 10mg  daily.  Side effects discussed.   Return to clinic after testing.  Donika K. Posey Pronto, DO

## 2019-03-22 LAB — HM DIABETES EYE EXAM

## 2019-03-29 ENCOUNTER — Encounter: Payer: Self-pay | Admitting: Internal Medicine

## 2019-03-31 NOTE — Telephone Encounter (Signed)
Dr. Julien Nordmann please advise.  Should form be brought here for you to sign or who should advise DMV on patient's medical condition, medications and ability to operate a vehicle.

## 2019-04-09 ENCOUNTER — Telehealth: Payer: Self-pay | Admitting: Endocrinology

## 2019-04-09 NOTE — Telephone Encounter (Signed)
Patients husband called in regards to some DMV paperwork he dropped off on July 1 st to be signed by Dr.Ellison. Was calling for an update.  Please Advise, Thanks

## 2019-04-09 NOTE — Telephone Encounter (Signed)
Documents were completed from and endocrinology standpoint, signed and faxed on 03/31/19 @ 423pm. Confirmation received.

## 2019-04-12 ENCOUNTER — Encounter: Payer: Self-pay | Admitting: Endocrinology

## 2019-04-15 NOTE — Telephone Encounter (Signed)
Patients husband stopped by with [facility] paperwork. He stated that there was a page not completed by Dr.Ellison.  Paperwork placed in box.  Please Advise, Thanks

## 2019-04-15 NOTE — Telephone Encounter (Signed)
This paperwork has been reviewed and has been placed on Dr. Cordelia Pen desk. It is possible that the form in question need to be completed by pt PCP. I will await Dr. Cordelia Pen response re: how he wishes to proceed.

## 2019-04-15 NOTE — Progress Notes (Signed)
Letter mailed informing Victoria Gates that Dr. Loanne Drilling is not able to complete the [facility] form as she requested.

## 2019-04-23 ENCOUNTER — Encounter: Payer: Self-pay | Admitting: Family Medicine

## 2019-04-30 ENCOUNTER — Other Ambulatory Visit: Payer: Self-pay | Admitting: Family Medicine

## 2019-05-28 ENCOUNTER — Other Ambulatory Visit: Payer: Self-pay

## 2019-05-28 ENCOUNTER — Ambulatory Visit (INDEPENDENT_AMBULATORY_CARE_PROVIDER_SITE_OTHER): Payer: BC Managed Care – PPO

## 2019-05-28 DIAGNOSIS — Z23 Encounter for immunization: Secondary | ICD-10-CM | POA: Diagnosis not present

## 2019-05-31 ENCOUNTER — Telehealth: Payer: Self-pay | Admitting: *Deleted

## 2019-05-31 ENCOUNTER — Telehealth: Payer: Self-pay | Admitting: Family Medicine

## 2019-05-31 NOTE — Telephone Encounter (Signed)
Patient's husband is calling to check the status of the DMV paperwork that was sent in to be filled out. He stated DMV is requesting his wife's licence because the paperwork has not been sent back. He would like a call back please.

## 2019-05-31 NOTE — Telephone Encounter (Signed)
They are done

## 2019-05-31 NOTE — Telephone Encounter (Signed)
Copied from [place] [phone]. Topic: General - Inquiry >> May 31, 2019  3:03 PM Scherrie Gerlach wrote: Reason for CRM: husband states you can mail the paperwork to the pt.

## 2019-05-31 NOTE — Telephone Encounter (Signed)
Spoke with husband and he stated that Dr. Loanne Drilling filled out the rest and was not comfortable filling out the top part which they gave [place] out.

## 2019-05-31 NOTE — Telephone Encounter (Signed)
Left message on machine that we printed forms from Dr. Rosario Adie office to fill out and will call once its complete.

## 2019-06-01 NOTE — Telephone Encounter (Signed)
Form faxed and mailed to patient.

## 2019-06-01 NOTE — Telephone Encounter (Signed)
PAPERWORK MAILED

## 2019-06-04 ENCOUNTER — Encounter: Payer: Self-pay | Admitting: Psychology

## 2019-06-04 ENCOUNTER — Other Ambulatory Visit: Payer: Self-pay

## 2019-06-04 ENCOUNTER — Ambulatory Visit: Payer: BC Managed Care – PPO | Admitting: Psychology

## 2019-06-04 ENCOUNTER — Ambulatory Visit (INDEPENDENT_AMBULATORY_CARE_PROVIDER_SITE_OTHER): Payer: BC Managed Care – PPO | Admitting: Psychology

## 2019-06-04 DIAGNOSIS — G3184 Mild cognitive impairment, so stated: Secondary | ICD-10-CM | POA: Diagnosis not present

## 2019-06-04 NOTE — Progress Notes (Signed)
   Neuropsychology Note   Victoria Gates completed 120 minutes of neuropsychological testing with technician, Milana Kidney, B.S., under the supervision of Dr. Christia Reading, Ph.D., licensed neuropsychologist. The patient did not appear overtly distressed by the testing session, per behavioral observation or via self-report to the technician. Rest breaks were offered.    In considering the patient's current level of functioning, level of presumed impairment, nature of symptoms, emotional and behavioral responses during the interview, level of literacy, and observed level of motivation/effort, a battery of tests was selected and communicated to the psychometrician.   Communication between the psychologist and technician was ongoing throughout the testing session and changes were made as deemed necessary based on patient performance on testing, technician observations and additional pertinent factors such as those listed above.   Victoria Gates will return within approximately two weeks for an interactive feedback session with Dr. Melvyn Novas at which time his test performances, clinical impressions, and treatment recommendations will be reviewed in detail. The patient understands she can contact our office should she require our assistance before this time.   Full report to follow.  120 minutes were spent face-to-face with patient administering standardized tests and 15 minutes were spent scoring (technician). [CPT [id number], [id number]

## 2019-06-04 NOTE — Progress Notes (Addendum)
NEUROPSYCHOLOGICAL EVALUATION [place]. [facility] Department of Neurology  Reason for Referral:   Victoria Gates is a 68 y.o. African-American female referred by Victoria Gates, D.O., to characterize her current cognitive functioning and assist with diagnostic clarity and treatment planning in the context of subjective cognitive difficulties and evidence for potential behavioral dysregulation (increased laughing at inappropriate times).  Assessment and Plan:   Clinical Impression(s): The validity of neuropsychological testing is limited by the extent to which the individual may be assumed to have exerted adequate effort during testing. Victoria Gates expressed her intention to perform to the best of her abilities. However, scores across stand-alone performance validity measures were below expectation. As such, while, I do believe that Victoria Gates is exhibiting significant cognitive dysfunction, current results may underestimate her true cognitive abilities.  Overall, if taken at face value, results suggest somewhat diffuse cognitive dysfunction, with particular weaknesses across receptive and expressive language, executive functioning, and verbal learning and memory. A relative strength was noted across visual learning and memory, while performance variability was noted across aspects of processing speed and attention/concentration. 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 However, she is likely towards the severe end of this spectrum currently.   The etiology of her cognitive deficits is somewhat unclear. I do share concerns regarding frontotemporal dementia, given prominent difficulties with language and executive functioning, as well as neuroimaging suggesting bilateral temporal atrophy. 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 Her report of word finding difficulties with fluent speech is also consistent with this presentation. Despite the presence of inappropriate laughing behaviors, I do not feel that she fits with a typical behavioral variant of frontotemporal dementia, given that these traits are longstanding (albeit elevated in frequency currently) and no other instances of behavioral dysregulation, social inappropriateness, or personality changes were reported. Additionally, there are some concerns for a multiple etiology presentation, given that frontotemporal dementia and Alzheimer's disease often share similar pathology. However, visual memory appeared generally within expectation, which would be inconsistent with a typical presentation of Alzheimer's disease. Continued medical monitoring will be important moving forward.  Recommendations: -Repeat neuropsychological evaluation in 12-18 months (or sooner if functional decline is noted) will be beneficial in better assessing cognitive and functional changes over time.  -While neurocognitive testing is not a strong predictor of driving abilities, I do have concerns given the extent of her cognitive dysfunction. A referral to an occupational therapist who can conduct a formal driving simulator evaluation would be beneficial in ensuring safety behind the wheel.  -It will be important for Victoria Gates to have another person with her when in situations where she may need to process information, weigh the pros and cons of different options, and make decisions, in order to ensure that she fully understands and recalls all information to be considered.  -If not already done, Victoria Gates and her family may want to discuss her wishes regarding durable power of attorney and medical decision making, so  that she can have input into these choices. Additionally, they may wish to discuss future plans for caretaking and seek out community options for in home/residential care should they become necessary.  -When learning new information, she would benefit from information being broken up  into small, manageable pieces. She may also find it helpful to articulate the material in her own words and in a context to promote encoding at the onset of a new task. This material may need to be repeated multiple times to promote encoding.  -To address problems with fluctuating attention, she may wish to consider:   -Avoiding external distractions (TV, music, background noise/conversations, etc.) when needing to concentrate   -Writing down complicated information and using checklists, rather than holding everything in mind   -Attempting and completing one task at a time before moving on to the next step/task (i.e., no multi-tasking)   -Verbalizing aloud each step of a task to maintain focus   -Reducing the amount of information considered at one time  -Ms. Crammer had difficulty with receptive language, particularly when presented in complex sentence structures. Presenting information it in short, concrete sentences will be helpful to ensure her comprehension.  Review of Records:   Victoria Gates was seen by [facility] Victoria Gates, D.O.) on 01/15/2019 for an evaluation of memory problems. Victoria Gates was unaccompanied during this appointment and was said to be a poor historian. Regarding memory concerns, trouble with generalized forgetfulness, including frequently repeating herself and forgetting to perform day-to-day tasks, was said to have been observable for the past few years. Difficulties were exacerbated following treatment for multiple myeloma in 2015. ADLs, including medication and financial management, as well as driving, were described as intact. Changes in her mood and sleep patterns were also denied. Dr.  Posey Gates did note the presence of inappropriate laughter throughout a majority of the appointment. Performance on a brief cognitive screening instrument over the phone (blind MOCA) was in the abnormal range (14/22). Points were lost across the following domains: letter reading (0/1), sentence repetition (0/2), verbal fluency (0/1), verbal abstraction (0/2), and delayed recall (3/6). Ultimately, Ms. Skiff was referred for a comprehensive neuropsychological evaluation to characterize her cognitive abilities and to assist with diagnostic clarity and treatment planning.  Brain MRI on 03/08/2019 revealed cerebral atrophy primarily affecting the temporal lobes (right greater than left). Head CT on 05/09/2016 also revealed slight atrophy of the temporal lobes.  Past Medical History:  Diagnosis Date   Allergic rhinitis 01/02/2009   Bradycardia 04/29/2016   Cervical Radiculopathy 02/01/2010   Left side   Chest pain 04/15/2012   Ex MV: Ex time 10 mins, EF 63%, no ischemia/infarct. Occas PAC's/PVC's.   Diabetes mellitus, type II 03/18/2008   Encounter for antineoplastic chemotherapy 04/29/2016   Esophageal Stricture 04/15/2007   s/p dilitation   GERD (gastroesophageal reflux disease) 09/21/2010   Herpes zoster 11/05/2010   Hiatal Hernia 04/15/2007   History of colonic polyps 04/24/2007   Hyperplastic only   Hypercholesterolemia 07/03/2009   Hypokalemia 11/30/2015   Iron deficiency anemia 04/24/2007   Multiple myeloma ([place]) 02/2014   Osteoporosis 04/24/2007    Past Surgical History:  Procedure Laterality Date   ESOPHAGOGASTRODUODENOSCOPY  04/15/2007   TUBAL LIGATION      Family History  Problem Relation Age of Onset   Brain cancer Mother    Heart disease Mother    Lung cancer Father    Heart disease Father    Diabetes Sister    Heart disease Sister    Diabetes Brother    Heart disease Brother    Cancer Neg Hx        No FH of Colon Cancer   Colon cancer Neg Hx     Esophageal cancer Neg Hx    Rectal  cancer Neg Hx    Stomach cancer Neg Hx      Current Outpatient Medications:    Ascorbic Acid (VITAMIN C) 1000 MG tablet, Take 1,000 mg by mouth daily. , Disp: , Rfl:    Calcium 500-125 MG-UNIT TABS, Take 1 tablet by mouth daily.  , Disp: , Rfl:    cholecalciferol (VITAMIN D) 1000 UNITS tablet, Take 1,000 Units by mouth daily., Disp: , Rfl:    donepezil (ARICEPT) 5 MG tablet, Take 1 tablet (5 mg total) by mouth at bedtime for 30 days, THEN 2 tablets (10 mg total) at bedtime., Disp: 90 tablet, Rfl: 2   lansoprazole (PREVACID) 30 MG capsule, Take 30 mg by mouth daily with breakfast. , Disp: , Rfl:    LIVALO 1 MG TABS, TAKE 1 TABLET (1 MG TOTAL) BY MOUTH AT BEDTIME., Disp: 90 tablet, Rfl: 0  Clinical Interview:   Ms. Guevarra was a limited historian and often deferred to her husband, who accompanied her during the clinical interview and provided answers to a majority of questions.   Cognitive Symptoms: Decreased short-term memory: Endorsed. Provided examples included Ms. Marcinek frequently repeating herself, getting lost, and having trouble remembering the details of prior conversations. Her husband noted that she frequently denies these occurrences. However, during the interview, Ms. Hendershott did report that her husband and children have mentioned these behaviors to her before.  Decreased long-term memory: Denied. Decreased attention/concentration: Endorsed. Specific details surrounding these difficulties were unable to be obtained. Reduced processing speed: Endorsed. Difficulties with executive functions: Largely denied. Specifically, her husband denied the presence of difficulties with organization, indecisiveness, and impulsivity. He did acknowledge some instances of unsafe driving, but other instances of using poor judgment were denied.  Difficulties with emotion regulation: Largely denied. Mr. Codd did mention his wife's frequent laughing at  inappropriate times. He noted that while this is somewhat of a longstanding trait (possibly used as a defense mechanism in situations where she feels uncomfortable), it has occurred much more frequently since the start of 2020.  Difficulties with receptive language: Endorsed. These were described as mild in nature and said to occur "sometimes." Difficulties with word finding: Endorsed. Decreased visuoperceptual ability: Denied.  Trajectory of deficits: Cognitive difficulties described above were said to first become noticeable in 2017-2018, following the conclusion of chemotherapy treatment for multiple myeloma. Since that time, Mr. Warsame described his wife's cognitive abilities as stable and not exhibiting a perceived decline (outside of an increase in laughing behaviors for the past 6-9 months).  Difficulties completing ADLs: Denied. Medication and financial management were described as intact. Likewise, Ms. Leedy continues to drive without significant issue.   Additional Medical History: History of traumatic brain injury/concussion: Denied. History of stroke: Denied. History of seizure activity: Denied. History of known exposure to toxins: Denied. Symptoms of chronic pain: Denied. Experience of frequent headaches/migraines: Denied. Frequent instances of dizziness/vertigo: Denied.  Sensory changes: Denied. Ms. Drozdowski wears corrective lenses with positive effect. No other sensory changes/difficulties (i.e., hearing, taste, smell) were endorsed. Balance/coordination difficulties: Denied. Other motor difficulties: Denied.  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 Systemic chemotherapy with Carfilzomib, Cytoxan, and dexamethasone was started soon after diagnosis. 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 Maintenance Revlimid treatment was additionally performed. Two years of treatment was  completed in February 2018. The date of treatment completion was confirmed by Ms. Mendia's husband during the current interview.   Sleep History: Estimated  hours obtained each night: 8-9 hours. Difficulties falling asleep: Denied. Difficulties staying asleep: Denied. Feels rested and refreshed upon awakening: Endorsed.  History of snoring: Endorsed. Snoring symptoms were said to occur "sometimes" and be mild in nature. History of waking up gasping for air: Denied. Witnessed breath cessation while asleep: Denied.  History of vivid dreaming: Denied. Excessive movement while asleep: Denied. Instances of acting out her dreams: Denied.  Psychiatric/Behavioral Health History: Depression: Denied. Ms. Lathon described her mood as "ok" and stated that she is a generally happy individual. She denied a history of prior mental health concerns in any capacity. Likewise, current or remote suicidal ideation, intent, or plan was also denied. Anxiety: Denied. Mania: Denied. Trauma History: Denied. Visual/auditory hallucinations: Denied. Delusional thoughts: Denied. Mental health treatment: Denied.  Tobacco: Denied. Alcohol: Ms. Savarino denied current alcohol consumption, as well as a history of problematic alcohol use, abuse, or dependence.  Recreational drugs: Denied. Caffeine: Denied.  Academic/Vocational History: Highest level of educational attainment: 14 years. Ms. Feld completed high school, as well as two additional years of college, being enrolled in online courses. She did not earn an Associate's degree. She described herself as an average (B/C) student in academic settings.   History of developmental delay: Denied. History of grade repetition: Denied. History of class failures: Denied. Enrollment in special education courses: Denied. History of diagnosed specific learning disability: Denied. History of ADHD: Denied.  Employment: Ms. Colan works at the [employer] in [employer]. However, she has been largely unable to work during the ongoing COVID-19 pandemic.   Evaluation Results:   Behavioral Observations: Ms. Eye was accompanied by her husband, arrived to her appointment on time, and was appropriately dressed and groomed. Observed gait and station were within normal limits. Gross motor functioning appeared intact upon informal observation and no abnormal movements (e.g., tremors) were noted. Her affect was bright overall. Inappropriate laughing behaviors were noted across the entirety of the interview and accompanied every response she provided. She also often exhibited a wide eyed expression while being asked questions, commonly seen when discussing something very surprising or humorous. Spontaneous speech was fluent. However, word finding difficulties were observed during the clinical interview and she often deferred to her husband to provide answers to questions.   During cognitive testing, Ms. Hunkele exhibited difficulties understanding task instructions and required said instructions to be repeated several times. This was especially true for more complex tasks assessing executive functioning. Laughing behaviors persisted following the provision of instructions, but generally subsided while concentrating on the task at hand. During naming tasks, Ms. Westra appeared genuinely surprised when learning of the correct item and had trouble preceiving certain objects correctly. Overall, sustained attention was appropriate throughout. Thought processes were coherent, organized, and normal in content. Task engagement was variable and Ms. Beasley tended to discontinue tasks prematurely due to perceived difficulty. Overall, Ms. Gatton was cooperative with the clinical interview and generally with subsequent testing procedures.   Adequacy of Effort: The validity of neuropsychological testing is limited by the extent to which the individual being tested may be  assumed to have exerted adequate effort during testing. Ms. Bamba expressed her intention to perform to the best of her abilities and exhibited adequate task engagement and persistence. Scores across stand-alone performance validity measures were below expectation. As such, the results of the current evaluation should be interpreted with caution. While, I do believe that Ms. Verrilli is exhibiting significant cognitive dysfunction, current results may underestimate her true cognitive abilities.  Test Results: Ms. Charlot was fairly disoriented at the time of the current evaluation. Points were lost for not knowing her phone number (0/3), the current date (1/2), the current day of the week (0/2), and the name of the current location (1/2).  Intellectual abilities based upon educational and vocational attainment were estimated to be in the average range. Premorbid abilities were estimated to be within the exceptionally low range based upon a single-word reading test (TOPF).   Processing speed was variable, ranging from the exceptionally low to average normative ranges. Basic attention was below average. More complex attention (e.g., working memory) was well below average. Performance across tasks assessing executive functions (e.g., cognitive flexibility, response inhibition, abstract reasoning, analytical problem solving) were somewhat variable, but generally impaired. A relative strength was noted across a task assessing nonverbal abstract reasoning, while variability was noted across tasks assessing cognitive flexibility and complex planning. Additionally, performance across a task assessing safety and judgment was impaired and Ms. Joubert was unable to answer basic questions (e.g., she responded to "Why should you blow out candles before going to bed?" with "I would leave it on.").   Assessed receptive language abilities were impaired. This was consistent with Ms. Batterman having difficulties understanding  task instructions and often requiring said instructions to be repeated several times. Assessed expressive language (e.g., verbal fluency and confrontation naming) was impaired, with notable difficulty across confrontation naming tasks.     Assessed visuospatial/visuoconstructional abilities were somewhat variable, ranging from the impaired to below average ranges. Notably, Ms. Vicencio exhibited confusion when asked to draw a clock and was unable to make an attempt.    Learning (i.e., encoding) of novel verbal and visual information was variable, with a relative strength exhibited across visual information. Spontaneous delayed recall (i.e., retrieval) of previously learned information was commensurate with performance across initial learning trials. Retention rates were appropriate across visual tasks. However, she exhibited an amnestic profile across verbal memory tasks and did not recall ever being administered those tasks earlier in the evaluation.   Results of emotional screening instruments suggested that recent symptoms of generalized anxiety were in the minimal range, while symptoms of depression were within normal limits.   Tables of Scores:   Note: This summary of test scores accompanies the interpretive report and should not be considered in isolation without reference to the appropriate sections in the text. Descriptors are based on appropriate normative data and may be adjusted based on clinical judgment. The terms impaired and within normal limits (WNL) are used when a more specific level of functioning cannot be determined.       Effort Testing:   DESCRIPTOR       [employer] (ACS) Word Choice: --- --- Below Expectation  Dot Counting Test: --- --- Below Expectation       Cognitive Screening:          NAB Screening Battery, Form 1 Standard Score/ T Score Percentile   Total Score 52 <1 Exceptionally Low    Orientation 22/29 <1 Exceptionally Low  Attention Domain  68 2 Well Below Average    Digits Forward 40 16 Below Average    Digits Backwards 29 2 Well Below Average    Letters & Numbers A Efficiency 31 3 Well Below Average    Letters & Numbers B Efficiency 43 25 Average  Language Domain 45 <1 Exceptionally Low    Auditory Comprehension 19 <1 Exceptionally Low    Naming 19 <1 Exceptionally Low  Memory Domain 68  2 Well Below Average    Shape Learning Immediate Recognition 49 46 Average    Story Learning Immediate Recall 19 <1 Exceptionally Low    Shape Learning Delayed Recognition 50 50 Average    Story Learning Delayed Recall 20 <1 Exceptionally Low  Spatial Domain 69 2 Well Below Average    Visual Discrimination 24 <1 Exceptionally Low    Design Construction 38 12 Below Average  Executive Functions Domain 69 2 Well Below Average    Mazes 44 27 Average    Word Generation 21 <1 Exceptionally Low       Estimated Intellectual Functioning:           Standard Score Percentile   Test of Premorbid Functioning (TOPF): 68 2 Exceptionally Low       Attention/Executive Function:          Trail Making Test (TMT): Raw Score (T Score) Percentile     Part A 49 secs.,  0 errors (44) 27 Average    Part B 299 secs.,  2 errors (28) 2 Well Below Average        Scaled Score Percentile   WAIS-IV Similarities: 4 2 Well Below Average       D-KEFS Color-Word Interference Test: Raw Score (Scaled Score) Percentile     Color Naming 66 secs. (1) <1 Exceptionally Low    Word Reading 37 secs. (4) 2 Well Below Average    Inhibition 147 secs. (1) <1 Exceptionally Low      Total Errors 7 errors (4) 2 Well Below Average    Inhibition/Switching 155 secs. (1) <1 Exceptionally Low      Total Errors 10 errors (3) 1 Exceptionally Low       D-KEFS 20 Questions Test: Scaled Score Percentile     Total Weighted Achievement Score Discontinued --- Impaired    Initial Abstraction Score Discontinued --- Impaired       NAB Executive Functions Module, Form 1: T Score  Percentile     Judgment 19 <1 Exceptionally Low       Language:          Verbal Fluency Test: Raw Score  (T Score) Percentile     Phonemic Fluency (FAS) 7 (21) <1 Exceptionally Low    Animal Fluency 3 (19) <1 Exceptionally Low             NAB Language Module, Form 1: Raw Score  (T Score) Percentile     Naming 2/31 (19) <1 Exceptionally Low       Visuospatial/Visuoconstruction:      Raw Score Percentile   Clock Drawing: Discontinued --- Impaired        Scaled Score Percentile   WAIS-IV Matrix Reasoning: 6 9 Below Average       Social Perception:          ACS Social Cognition: Scaled Score Percentile     Affect Naming 1 <1 Exceptionally Low       Mood and Personality:      Raw Score Percentile   Geriatric Depression Scale: 3 --- Within Normal Limits  Geriatric Anxiety Scale: 1 --- Minimal    Somatic 0 --- Minimal    Cognitive 0 --- Minimal    Affective 1 --- Minimal       Additional Questionnaires:      Raw Score Percentile   PROMIS Sleep Disturbance Questionnaire: 10 --- None to Slight   Informed Consent and Coding/Compliance:   Ms. Schuler was provided with a verbal description  of the nature and purpose of the present neuropsychological evaluation. Also reviewed were the foreseeable risks and/or discomforts and benefits of the procedure, limits of confidentiality, and mandatory reporting requirements of this provider. The patient was given the opportunity to ask questions and receive answers about the evaluation. Oral consent to participate was provided by the patient.   This evaluation was conducted by Christia Reading, Ph.D., licensed clinical neuropsychologist. Ms. Alvi completed a 20-minute clinical interview, billed as one unit [phone], and 135 minutes of cognitive testing, billed as one unit [phone] and four additional units [id number]. Psychometrist Milana Kidney, B.S. assisted Dr. Melvyn Novas with test administration and scoring procedures. As a separate and discrete service,  Dr. Melvyn Novas spent a total of 180 minutes in interpretation and report writing, billed as one unit 96132 and two units 96133.

## 2019-06-08 ENCOUNTER — Encounter: Payer: Self-pay | Admitting: Psychology

## 2019-06-11 ENCOUNTER — Encounter: Payer: Self-pay | Admitting: Psychology

## 2019-06-11 ENCOUNTER — Ambulatory Visit (INDEPENDENT_AMBULATORY_CARE_PROVIDER_SITE_OTHER): Payer: BC Managed Care – PPO | Admitting: Psychology

## 2019-06-11 ENCOUNTER — Other Ambulatory Visit: Payer: Self-pay

## 2019-06-11 DIAGNOSIS — G3184 Mild cognitive impairment, so stated: Secondary | ICD-10-CM

## 2019-06-11 NOTE — Progress Notes (Signed)
   NEUROPSYCHOLOGICAL EVALUATION - Feedback [place]. [facility] Department of Neurology  Reason for Referral:   Victoria Gates a 68 y.o. African-American female referred by Narda Amber, D.O.,to characterize hercurrent cognitive functioning and assist with diagnostic clarity and treatment planning in the context of subjective cognitive difficulties and evidence for potential behavioral dysregulation (increased laughing at inappropriate times).  Feedback:   Victoria Gates completed a comprehensive neuropsychological evaluation on 06/04/2019. Briefly, results suggested somewhat diffuse cognitive dysfunction, with particular weaknesses across receptive and expressive language, executive functioning, and verbal learning and memory. 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 However, she is likely towards the severe end of this spectrum currently. 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 Recommendations included a repeat neuropsychological evaluation in 12-18 months to assess the trajectory of her cognitive decline, as well as to complete a formal driving evaluation if not already done.  Victoria Gates was accompanied by her husband. Content of the current session focused on the results of the current evaluation and outlining concerns regarding frontotemporal dementia. Victoria Gates and her husband were given the opportunity to ask questions and all their questions were answered. They was also encouraged to reach out should additional questions arise. A copy of her report was provided at the conclusion of the visit.      A total of 20 minutes were spent with  Victoria Gates during the current feedback session.

## 2019-06-21 ENCOUNTER — Other Ambulatory Visit: Payer: Self-pay

## 2019-06-21 ENCOUNTER — Inpatient Hospital Stay: Payer: BC Managed Care – PPO | Attending: Internal Medicine

## 2019-06-21 DIAGNOSIS — Z9221 Personal history of antineoplastic chemotherapy: Secondary | ICD-10-CM | POA: Insufficient documentation

## 2019-06-21 DIAGNOSIS — Z79899 Other long term (current) drug therapy: Secondary | ICD-10-CM | POA: Insufficient documentation

## 2019-06-21 DIAGNOSIS — C9000 Multiple myeloma not having achieved remission: Secondary | ICD-10-CM | POA: Insufficient documentation

## 2019-06-21 DIAGNOSIS — E876 Hypokalemia: Secondary | ICD-10-CM | POA: Diagnosis not present

## 2019-06-21 LAB — CMP (CANCER CENTER ONLY)
ALT: 31 U/L (ref 0–44)
AST: 30 U/L (ref 15–41)
Albumin: 3.8 g/dL (ref 3.5–5.0)
Alkaline Phosphatase: 68 U/L (ref 38–126)
Anion gap: 7 (ref 5–15)
BUN: 8 mg/dL (ref 8–23)
CO2: 26 mmol/L (ref 22–32)
Calcium: 8.9 mg/dL (ref 8.9–10.3)
Chloride: 108 mmol/L (ref 98–111)
Creatinine: 1.06 mg/dL — ABNORMAL HIGH (ref 0.44–1.00)
GFR, Est AFR Am: >60 mL/min (ref >60)
GFR, Estimated: 54 mL/min — ABNORMAL LOW (ref >60)
Glucose, Bld: 109 mg/dL — ABNORMAL HIGH (ref 70–99)
Potassium: 3.1 mmol/L — ABNORMAL LOW (ref 3.5–5.1)
Sodium: 141 mmol/L (ref 135–145)
Total Bilirubin: 0.8 mg/dL (ref 0.3–1.2)
Total Protein: 7.2 g/dL (ref 6.5–8.1)

## 2019-06-21 LAB — CBC WITH DIFFERENTIAL (CANCER CENTER ONLY)
Abs Immature Granulocytes: 0.01 10*3/uL (ref 0.00–0.07)
Basophils Absolute: 0 10*3/uL (ref 0.0–0.1)
Basophils Relative: 1 %
Eosinophils Absolute: 0.1 10*3/uL (ref 0.0–0.5)
Eosinophils Relative: 2 %
HCT: 39.2 % (ref 36.0–46.0)
Hemoglobin: 12.8 g/dL (ref 12.0–15.0)
Immature Granulocytes: 0 %
Lymphocytes Relative: 44 %
Lymphs Abs: 2.5 10*3/uL (ref 0.7–4.0)
MCH: 28.5 pg (ref 26.0–34.0)
MCHC: 32.7 g/dL (ref 30.0–36.0)
MCV: 87.3 fL (ref 80.0–100.0)
Monocytes Absolute: 0.5 10*3/uL (ref 0.1–1.0)
Monocytes Relative: 8 %
Neutro Abs: 2.6 10*3/uL (ref 1.7–7.7)
Neutrophils Relative %: 45 %
Platelet Count: 167 10*3/uL (ref 150–400)
RBC: 4.49 MIL/uL (ref 3.87–5.11)
RDW: 13.2 % (ref 11.5–15.5)
WBC Count: 5.8 10*3/uL (ref 4.0–10.5)
nRBC: 0 % (ref 0.0–0.2)

## 2019-06-21 LAB — LACTATE DEHYDROGENASE: LDH: 157 U/L (ref 98–192)

## 2019-06-22 LAB — BETA 2 MICROGLOBULIN, SERUM: Beta-2 Microglobulin: 1.6 mg/L (ref 0.6–2.4)

## 2019-06-22 LAB — IGG, IGA, IGM
IgA: 344 mg/dL (ref 87–352)
IgG (Immunoglobin G), Serum: 1394 mg/dL (ref 586–1602)
IgM (Immunoglobulin M), Srm: 34 mg/dL (ref 26–217)

## 2019-06-22 LAB — KAPPA/LAMBDA LIGHT CHAINS
Kappa free light chain: 21.7 mg/L — ABNORMAL HIGH (ref 3.3–19.4)
Kappa, lambda light chain ratio: 1.15 (ref 0.26–1.65)
Lambda free light chains: 18.8 mg/L (ref 5.7–26.3)

## 2019-06-28 ENCOUNTER — Inpatient Hospital Stay (HOSPITAL_BASED_OUTPATIENT_CLINIC_OR_DEPARTMENT_OTHER): Payer: BC Managed Care – PPO | Admitting: Internal Medicine

## 2019-06-28 ENCOUNTER — Other Ambulatory Visit: Payer: Self-pay

## 2019-06-28 ENCOUNTER — Encounter: Payer: Self-pay | Admitting: Internal Medicine

## 2019-06-28 VITALS — BP 147/76 | HR 86 | Temp 98.3°F | Resp 16 | Ht 66.0 in | Wt 192.9 lb

## 2019-06-28 DIAGNOSIS — C9000 Multiple myeloma not having achieved remission: Secondary | ICD-10-CM | POA: Diagnosis not present

## 2019-06-28 DIAGNOSIS — I10 Essential (primary) hypertension: Secondary | ICD-10-CM | POA: Diagnosis not present

## 2019-06-28 DIAGNOSIS — E876 Hypokalemia: Secondary | ICD-10-CM | POA: Diagnosis not present

## 2019-06-28 MED ORDER — POTASSIUM CHLORIDE ER 20 MEQ PO TBCR: 10.0000 meq | EXTENDED_RELEASE_TABLET | Freq: Every day | ORAL | 0 refills | Status: DC

## 2019-06-28 NOTE — Progress Notes (Signed)
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  PRIOR THERAPY: 1) Systemic chemotherapy with Carfilzomib, Cytoxan and dexamethasone. First dose on 11/15/2013. Status post 5 cycles. 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 3) Maintenance Revlimid 10 mg by mouth daily. Status post 2 months of treatment. 4) Maintenance Revlimid 15 mg by mouth daily. Status post 4 months of treatment. 5) Maintenance treatment with Revlimid 10 mg by mouth daily status post 17 months. She completed 2 years of treatment in February 2018.  CURRENT THERAPY: Observation.  INTERVAL HISTORY: Victoria Gates 68 y.o. female returns to the clinic today for follow-up visit.  The patient is feeling fine today with no concerning complaints.  She denied having any chest pain, shortness of breath, cough or hemoptysis.  She denied having any fever or chills.  She has no nausea, vomiting, diarrhea or constipation.  She had repeat myeloma panel performed recently and she is here for evaluation and discussion of her lab results.  MEDICAL HISTORY: Past Medical History:  Diagnosis Date  . Allergic rhinitis 01/02/2009  . Bradycardia 04/29/2016  . Cervical Radiculopathy 02/01/2010   Left side  . Chest pain 04/15/2012   Ex MV: Ex time 10 mins, EF 63%, no ischemia/infarct. Occas PAC's/PVC's.  . Diabetes mellitus, type II 03/18/2008  . Encounter for antineoplastic chemotherapy 04/29/2016  . Esophageal Stricture 04/15/2007   s/p dilitation  . GERD (gastroesophageal reflux disease) 09/21/2010  . Herpes zoster 11/05/2010  . Hiatal Hernia 04/15/2007  . History of colonic polyps 04/24/2007   Hyperplastic only  . Hypercholesterolemia 07/03/2009  . Hypokalemia 11/30/2015  . Iron deficiency anemia  04/24/2007  . Multiple myeloma ([place]) 02/2014  . Osteoporosis 04/24/2007    ALLERGIES:  is allergic to atorvastatin; rosuvastatin; and sulfonamide derivatives.  MEDICATIONS:  Current Outpatient Medications  Medication Sig Dispense Refill  . Ascorbic Acid (VITAMIN C) 1000 MG tablet Take 1,000 mg by mouth daily.     . Calcium 500-125 MG-UNIT TABS Take 1 tablet by mouth daily.      . cholecalciferol (VITAMIN D) 1000 UNITS tablet Take 1,000 Units by mouth daily.    Marland Kitchen donepezil (ARICEPT) 5 MG tablet Take 1 tablet (5 mg total) by mouth at bedtime for 30 days, THEN 2 tablets (10 mg total) at bedtime. 90 tablet 2  . lansoprazole (PREVACID) 30 MG capsule Take 30 mg by mouth daily with breakfast.     . LIVALO 1 MG TABS TAKE 1 TABLET (1 MG TOTAL) BY MOUTH AT BEDTIME. 90 tablet 0   No current facility-administered medications for this visit.     SURGICAL HISTORY:  Past Surgical History:  Procedure Laterality Date  . ESOPHAGOGASTRODUODENOSCOPY  04/15/2007  . TUBAL LIGATION      REVIEW OF SYSTEMS:  A comprehensive review of systems was negative.   PHYSICAL EXAMINATION: General appearance: alert, cooperative and no distress Head: Normocephalic, without obvious abnormality, atraumatic Neck: no adenopathy, no JVD, supple, symmetrical, trachea midline and thyroid not enlarged, symmetric, no tenderness/mass/nodules Lymph nodes: Cervical, supraclavicular, and axillary nodes normal. Resp: clear to auscultation bilaterally Back: symmetric, no curvature. ROM normal. No CVA tenderness. Cardio: regular rate and rhythm, S1, S2 normal, no murmur, click, rub or gallop GI: soft, non-tender; bowel sounds normal; no masses,  no organomegaly  Extremities: extremities normal, atraumatic, no cyanosis or edema  ECOG PERFORMANCE STATUS: 0 - Asymptomatic  Blood pressure (!) 147/76, pulse 86, temperature 98.3 F (36.8 C), temperature source Temporal, resp. rate 16, height '5\' 6"'$  (1.676 m), weight 192 lb 14.4 oz  (87.5 kg), SpO2 100 %.  LABORATORY DATA: Lab Results  Component Value Date   WBC 5.8 06/21/2019   HGB 12.8 06/21/2019   HCT 39.2 06/21/2019   MCV 87.3 06/21/2019   PLT 167 06/21/2019      Chemistry      Component Value Date/Time   NA 141 06/21/2019 0914   NA 139 08/13/2017 0816   K 3.1 (L) 06/21/2019 0914   K 3.2 (L) 08/13/2017 0816   CL 108 06/21/2019 0914   CO2 26 06/21/2019 0914   CO2 26 08/13/2017 0816   BUN 8 06/21/2019 0914   BUN 9.0 08/13/2017 0816   CREATININE 1.06 (H) 06/21/2019 0914   CREATININE 1.0 08/13/2017 0816      Component Value Date/Time   CALCIUM 8.9 06/21/2019 0914   CALCIUM 9.2 08/13/2017 0816   ALKPHOS 68 06/21/2019 0914   ALKPHOS 59 08/13/2017 0816   AST 30 06/21/2019 0914   AST 22 08/13/2017 0816   ALT 31 06/21/2019 0914   ALT 17 08/13/2017 0816   BILITOT 0.8 06/21/2019 0914   BILITOT 0.80 08/13/2017 0816       RADIOGRAPHIC STUDIES: No results found.  ASSESSMENT AND PLAN:  This is a very pleasant 68 years old African-American female with multiple myeloma, IgA subtype status post induction systemic chemotherapy with Carfilzomib, Cytoxan and dexamethasone followed by peripheral blood autologous stem cell transplant followed by 2 years maintenance of treatment with Revlimid. The patient is on observation and has no concerning complaints today. Repeat myeloma panel showed no concerning findings for disease recurrence. I recommended for her to continue on observation with repeat myeloma panel in 4 months. For the hypokalemia, I will start the patient on potassium chloride 20 mEq p.o. daily for 7 days. She was advised to call immediately if she has any concerning symptoms in the interval.  The patient voices understanding of current disease status and treatment options and is in agreement with the current care plan. All questions were answered. The patient knows to call the clinic with any problems, questions or concerns. We can certainly see  the patient much sooner if necessary. I spent 10 minutes counseling the patient face to face. The total time spent in the appointment was 15 minutes.   Disclaimer: This note was dictated with voice recognition software. Similar sounding words can inadvertently be transcribed and may not be corrected upon review.

## 2019-06-29 ENCOUNTER — Telehealth: Payer: Self-pay | Admitting: Internal Medicine

## 2019-06-29 ENCOUNTER — Other Ambulatory Visit: Payer: Self-pay | Admitting: Medical Oncology

## 2019-06-29 DIAGNOSIS — E876 Hypokalemia: Secondary | ICD-10-CM

## 2019-06-29 MED ORDER — POTASSIUM CHLORIDE ER 20 MEQ PO TBCR: 20.0000 meq | EXTENDED_RELEASE_TABLET | Freq: Every day | ORAL | 0 refills | Status: DC

## 2019-06-29 NOTE — Telephone Encounter (Signed)
Scheduled appt per 9/28 los - mailed reminder letter with appt date and time   

## 2019-07-01 ENCOUNTER — Other Ambulatory Visit: Payer: Self-pay | Admitting: Family Medicine

## 2019-08-27 ENCOUNTER — Other Ambulatory Visit: Payer: Self-pay | Admitting: Family Medicine

## 2019-09-03 ENCOUNTER — Other Ambulatory Visit: Payer: Self-pay

## 2019-09-07 ENCOUNTER — Ambulatory Visit (INDEPENDENT_AMBULATORY_CARE_PROVIDER_SITE_OTHER): Payer: BC Managed Care – PPO | Admitting: Endocrinology

## 2019-09-07 ENCOUNTER — Encounter: Payer: Self-pay | Admitting: Endocrinology

## 2019-09-07 ENCOUNTER — Other Ambulatory Visit: Payer: Self-pay

## 2019-09-07 VITALS — BP 142/80 | HR 61 | Ht 66.0 in | Wt 190.8 lb

## 2019-09-07 DIAGNOSIS — E119 Type 2 diabetes mellitus without complications: Secondary | ICD-10-CM | POA: Diagnosis not present

## 2019-09-07 LAB — POCT GLYCOSYLATED HEMOGLOBIN (HGB A1C): Hemoglobin A1C: 5.7 % — AB (ref 4.0–5.6)

## 2019-09-07 NOTE — Patient Instructions (Addendum)
Your blood pressure is high today.  Please see your primary care provider soon, to have it rechecked good diet and exercise significantly improve the control of your diabetes.  You don't need any medication for the blood sugar now.   Please come back for a follow-up appointment in 6 months.

## 2019-09-07 NOTE — Progress Notes (Signed)
Subjective:    Patient ID: Victoria Gates, female    DOB: July 28, 1951, 68 y.o.   MRN: [id number]  HPI Pt returns for f/u of diabetes mellitus:  DM type: 2 Dx'ed: 4098 Complications: mild renal insuff.   Therapy: no medication now.   GDM: never DKA: never Severe hypoglycemia: never Pancreatitis: never Other: she has never been on insulin.   Interval history: pt states she feels well in general. She says her diet and exercise are just fair.  Past Medical History:  Diagnosis Date  . Allergic rhinitis 01/02/2009  . Bradycardia 04/29/2016  . Cervical Radiculopathy 02/01/2010   Left side  . Chest pain 04/15/2012   Ex MV: Ex time 10 mins, EF 63%, no ischemia/infarct. Occas PAC's/PVC's.  . Diabetes mellitus, type II 03/18/2008  . Encounter for antineoplastic chemotherapy 04/29/2016  . Esophageal Stricture 04/15/2007   s/p dilitation  . GERD (gastroesophageal reflux disease) 09/21/2010  . Herpes zoster 11/05/2010  . Hiatal Hernia 04/15/2007  . History of colonic polyps 04/24/2007   Hyperplastic only  . Hypercholesterolemia 07/03/2009  . Hypokalemia 11/30/2015  . Iron deficiency anemia 04/24/2007  . Multiple myeloma ([place]) 02/2014  . Osteoporosis 04/24/2007    Past Surgical History:  Procedure Laterality Date  . ESOPHAGOGASTRODUODENOSCOPY  04/15/2007  . TUBAL LIGATION      Social History   Socioeconomic History  . Marital status: Married    Spouse name: Not on file  . Number of children: Not on file  . Years of education: Not on file  . Highest education level: Not on file  Occupational History  . Occupation: MAIL Armed forces operational officer: [employer]  Tobacco Use  . Smoking status: Never Smoker  . Smokeless tobacco: Never Used  Substance and Sexual Activity  . Alcohol use: No  . Drug use: No  . Sexual activity: Yes  Other Topics Concern  . Not on file  Social History Narrative   Lives in [place] with spouse.   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    Social Determinants of Health   Financial Resource Strain:   . Difficulty of Paying Living Expenses: Not on file  Food Insecurity:   . Worried About Charity fundraiser in the Last Year: Not on file  . Ran Out of Food in the Last Year: Not on file  Transportation Needs:   . Lack of Transportation (Medical): Not on file  . Lack of Transportation (Non-Medical): Not on file  Physical Activity:   . Days of Exercise per Week: Not on file  . Minutes of Exercise per Session: Not on file  Stress:   . Feeling of Stress : Not on file  Social Connections:   . Frequency of Communication with Friends and Family: Not on file  . Frequency of Social Gatherings with Friends and Family: Not on file  . Attends Religious Services: Not on file  . Active Member of Clubs or Organizations: Not on file  . Attends Archivist Meetings: Not on file  . Marital Status: Not on file  Intimate Partner Violence:   . Fear of Current or Ex-Partner: Not on file  . Emotionally Abused: Not on file  . Physically Abused: Not on file  . Sexually Abused: Not on file    Current Outpatient Medications on File Prior to Visit  Medication Sig Dispense Refill  . Ascorbic Acid (VITAMIN C) 1000 MG tablet Take 1,000 mg by mouth daily.     Marland Kitchen  Calcium 500-125 MG-UNIT TABS Take 1 tablet by mouth daily.      . cholecalciferol (VITAMIN D) 1000 UNITS tablet Take 1,000 Units by mouth daily.    . lansoprazole (PREVACID) 30 MG capsule Take 30 mg by mouth daily with breakfast.     . LIVALO 1 MG TABS TAKE 1 TABLET (1 MG TOTAL) BY MOUTH AT BEDTIME. 30 tablet 0  . Potassium Chloride ER 20 MEQ TBCR Take 20 mEq by mouth daily. 7 tablet 0  . donepezil (ARICEPT) 5 MG tablet Take 1 tablet (5 mg total) by mouth at bedtime for 30 days, THEN 2 tablets (10 mg total) at bedtime. 90 tablet 2   No current facility-administered medications on file prior to visit.    Allergies  Allergen Reactions  . Atorvastatin     REACTION: myalgias   . Rosuvastatin     REACTION: myalgias  . Sulfonamide Derivatives     Unknown reaction     Family History  Problem Relation Age of Onset  . Brain cancer Mother   . Heart disease Mother   . Lung cancer Father   . Heart disease Father   . Diabetes Sister   . Heart disease Sister   . Diabetes Brother   . Heart disease Brother   . Cancer Neg Hx        No FH of Colon Cancer  . Colon cancer Neg Hx   . Esophageal cancer Neg Hx   . Rectal cancer Neg Hx   . Stomach cancer Neg Hx     BP (!) 142/80 (BP Location: Right Arm, Patient Position: Sitting, Cuff Size: Large)   Pulse 61   Ht '5\' 6"'$  (1.676 m)   Wt 190 lb 12.8 oz (86.5 kg)   SpO2 98%   BMI 30.80 kg/m    Review of Systems She has lost 7 lbs since last ov.     Objective:   Physical Exam VITAL SIGNS:  See vs page GENERAL: no distress Pulses: dorsalis pedis intact bilat.   MSK: no deformity of the feet CV: no leg edema Skin:  no ulcer on the feet, but the skin is dry.  normal color and temp on the feet.   Neuro: sensation is intact to touch on the feet  Lab Results  Component Value Date   HGBA1C 5.7 (A) 09/07/2019       Assessment & Plan:  HTN: is noted today Type 2 DM: well-controlled off medication   Patient Instructions  Your blood pressure is high today.  Please see your primary care provider soon, to have it rechecked good diet and exercise significantly improve the control of your diabetes.  You don't need any medication for the blood sugar now.   Please come back for a follow-up appointment in 6 months.

## 2019-10-11 ENCOUNTER — Other Ambulatory Visit: Payer: Self-pay | Admitting: Family Medicine

## 2019-10-26 ENCOUNTER — Other Ambulatory Visit: Payer: Self-pay

## 2019-10-26 ENCOUNTER — Inpatient Hospital Stay: Payer: BC Managed Care – PPO | Attending: Internal Medicine

## 2019-10-26 DIAGNOSIS — C9000 Multiple myeloma not having achieved remission: Secondary | ICD-10-CM | POA: Insufficient documentation

## 2019-10-26 DIAGNOSIS — Z79899 Other long term (current) drug therapy: Secondary | ICD-10-CM | POA: Diagnosis not present

## 2019-10-26 DIAGNOSIS — Z9221 Personal history of antineoplastic chemotherapy: Secondary | ICD-10-CM | POA: Insufficient documentation

## 2019-10-26 DIAGNOSIS — E876 Hypokalemia: Secondary | ICD-10-CM | POA: Diagnosis not present

## 2019-10-26 LAB — CBC WITH DIFFERENTIAL (CANCER CENTER ONLY)
Abs Immature Granulocytes: 0.01 10*3/uL (ref 0.00–0.07)
Basophils Absolute: 0.1 10*3/uL (ref 0.0–0.1)
Basophils Relative: 1 %
Eosinophils Absolute: 0.1 10*3/uL (ref 0.0–0.5)
Eosinophils Relative: 2 %
HCT: 40.4 % (ref 36.0–46.0)
Hemoglobin: 13.4 g/dL (ref 12.0–15.0)
Immature Granulocytes: 0 %
Lymphocytes Relative: 52 %
Lymphs Abs: 3.2 10*3/uL (ref 0.7–4.0)
MCH: 28.8 pg (ref 26.0–34.0)
MCHC: 33.2 g/dL (ref 30.0–36.0)
MCV: 86.7 fL (ref 80.0–100.0)
Monocytes Absolute: 0.5 10*3/uL (ref 0.1–1.0)
Monocytes Relative: 8 %
Neutro Abs: 2.2 10*3/uL (ref 1.7–7.7)
Neutrophils Relative %: 37 %
Platelet Count: 185 10*3/uL (ref 150–400)
RBC: 4.66 MIL/uL (ref 3.87–5.11)
RDW: 13.7 % (ref 11.5–15.5)
WBC Count: 6 10*3/uL (ref 4.0–10.5)
nRBC: 0 % (ref 0.0–0.2)

## 2019-10-26 LAB — CMP (CANCER CENTER ONLY)
ALT: 33 U/L (ref 0–44)
AST: 28 U/L (ref 15–41)
Albumin: 3.8 g/dL (ref 3.5–5.0)
Alkaline Phosphatase: 71 U/L (ref 38–126)
Anion gap: 9 (ref 5–15)
BUN: 13 mg/dL (ref 8–23)
CO2: 23 mmol/L (ref 22–32)
Calcium: 9 mg/dL (ref 8.9–10.3)
Chloride: 110 mmol/L (ref 98–111)
Creatinine: 1.12 mg/dL — ABNORMAL HIGH (ref 0.44–1.00)
GFR, Est AFR Am: 58 mL/min — ABNORMAL LOW (ref >60)
GFR, Estimated: 50 mL/min — ABNORMAL LOW (ref >60)
Glucose, Bld: 128 mg/dL — ABNORMAL HIGH (ref 70–99)
Potassium: 3.5 mmol/L (ref 3.5–5.1)
Sodium: 142 mmol/L (ref 135–145)
Total Bilirubin: 0.7 mg/dL (ref 0.3–1.2)
Total Protein: 7.6 g/dL (ref 6.5–8.1)

## 2019-10-26 LAB — LACTATE DEHYDROGENASE: LDH: 142 U/L (ref 98–192)

## 2019-10-27 LAB — IGG, IGA, IGM
IgA: 400 mg/dL — ABNORMAL HIGH (ref 87–352)
IgG (Immunoglobin G), Serum: 1468 mg/dL (ref 586–1602)
IgM (Immunoglobulin M), Srm: 39 mg/dL (ref 26–217)

## 2019-10-27 LAB — BETA 2 MICROGLOBULIN, SERUM: Beta-2 Microglobulin: 1.6 mg/L (ref 0.6–2.4)

## 2019-10-27 LAB — KAPPA/LAMBDA LIGHT CHAINS
Kappa free light chain: 20.2 mg/L — ABNORMAL HIGH (ref 3.3–19.4)
Kappa, lambda light chain ratio: 1.02 (ref 0.26–1.65)
Lambda free light chains: 19.9 mg/L (ref 5.7–26.3)

## 2019-10-28 ENCOUNTER — Encounter: Payer: Self-pay | Admitting: Internal Medicine

## 2019-10-28 ENCOUNTER — Inpatient Hospital Stay (HOSPITAL_BASED_OUTPATIENT_CLINIC_OR_DEPARTMENT_OTHER): Payer: BC Managed Care – PPO | Admitting: Internal Medicine

## 2019-10-28 ENCOUNTER — Other Ambulatory Visit: Payer: Self-pay

## 2019-10-28 VITALS — BP 125/73 | HR 71 | Temp 97.3°F | Resp 17 | Ht 66.0 in | Wt 189.5 lb

## 2019-10-28 DIAGNOSIS — C9000 Multiple myeloma not having achieved remission: Secondary | ICD-10-CM

## 2019-10-28 DIAGNOSIS — I10 Essential (primary) hypertension: Secondary | ICD-10-CM

## 2019-10-28 NOTE — Progress Notes (Signed)
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  PRIOR THERAPY: 1) Systemic chemotherapy with Carfilzomib, Cytoxan and dexamethasone. First dose on 11/15/2013. Status post 5 cycles. 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 3) Maintenance Revlimid 10 mg by mouth daily. Status post 2 months of treatment. 4) Maintenance Revlimid 15 mg by mouth daily. Status post 4 months of treatment. 5) Maintenance treatment with Revlimid 10 mg by mouth daily status post 17 months. She completed 2 years of treatment in February 2018.  CURRENT THERAPY: Observation.  INTERVAL HISTORY: Victoria Gates 69 y.o. female returns to the clinic today for 4 months follow-up visit.  The patient is feeling fine today with no concerning complaints.  She denied having any chest pain, shortness of breath, cough or hemoptysis.  She denied having any fever or chills.  She has no nausea, vomiting, diarrhea or constipation.  She has been in observation since February 2018.  The patient is here today for evaluation with repeat myeloma panel.  MEDICAL HISTORY: Past Medical History:  Diagnosis Date  . Allergic rhinitis 01/02/2009  . Bradycardia 04/29/2016  . Cervical Radiculopathy 02/01/2010   Left side  . Chest pain 04/15/2012   Ex MV: Ex time 10 mins, EF 63%, no ischemia/infarct. Occas PAC's/PVC's.  . Diabetes mellitus, type II 03/18/2008  . Encounter for antineoplastic chemotherapy 04/29/2016  . Esophageal Stricture 04/15/2007   s/p dilitation  . GERD (gastroesophageal reflux disease) 09/21/2010  . Herpes zoster 11/05/2010  . Hiatal Hernia 04/15/2007  . History of colonic polyps 04/24/2007   Hyperplastic only  . Hypercholesterolemia 07/03/2009  . Hypokalemia 11/30/2015  . Iron deficiency  anemia 04/24/2007  . Multiple myeloma ([place]) 02/2014  . Osteoporosis 04/24/2007    ALLERGIES:  is allergic to atorvastatin; rosuvastatin; and sulfonamide derivatives.  MEDICATIONS:  Current Outpatient Medications  Medication Sig Dispense Refill  . Ascorbic Acid (VITAMIN C) 1000 MG tablet Take 1,000 mg by mouth daily.     . Calcium 500-125 MG-UNIT TABS Take 1 tablet by mouth daily.      . cholecalciferol (VITAMIN D) 1000 UNITS tablet Take 1,000 Units by mouth daily.    Marland Kitchen donepezil (ARICEPT) 5 MG tablet Take 1 tablet (5 mg total) by mouth at bedtime for 30 days, THEN 2 tablets (10 mg total) at bedtime. 90 tablet 2  . lansoprazole (PREVACID) 30 MG capsule Take 30 mg by mouth daily with breakfast.     . LIVALO 1 MG TABS TAKE 1 TABLET (1 MG TOTAL) BY MOUTH AT BEDTIME. NEED OFFICE VISIT 30 tablet 0  . Potassium Chloride ER 20 MEQ TBCR Take 20 mEq by mouth daily. 7 tablet 0   No current facility-administered medications for this visit.    SURGICAL HISTORY:  Past Surgical History:  Procedure Laterality Date  . ESOPHAGOGASTRODUODENOSCOPY  04/15/2007  . TUBAL LIGATION      REVIEW OF SYSTEMS:  A comprehensive review of systems was negative.   PHYSICAL EXAMINATION: General appearance: alert, cooperative and no distress Head: Normocephalic, without obvious abnormality, atraumatic Neck: no adenopathy, no JVD, supple, symmetrical, trachea midline and thyroid not enlarged, symmetric, no tenderness/mass/nodules Lymph nodes: Cervical, supraclavicular, and axillary nodes normal. Resp: clear to auscultation bilaterally Back: symmetric, no curvature. ROM normal. No CVA tenderness. Cardio: regular rate  and rhythm, S1, S2 normal, no murmur, click, rub or gallop GI: soft, non-tender; bowel sounds normal; no masses,  no organomegaly Extremities: extremities normal, atraumatic, no cyanosis or edema  ECOG PERFORMANCE STATUS: 0 - Asymptomatic  Blood pressure 125/73, pulse 71, temperature (!) 97.3 F  (36.3 C), temperature source Oral, resp. rate 17, height '5\' 6"'$  (1.676 m), weight 189 lb 8 oz (86 kg), SpO2 100 %.  LABORATORY DATA: Lab Results  Component Value Date   WBC 6.0 10/26/2019   HGB 13.4 10/26/2019   HCT 40.4 10/26/2019   MCV 86.7 10/26/2019   PLT 185 10/26/2019      Chemistry      Component Value Date/Time   NA 142 10/26/2019 0935   NA 139 08/13/2017 0816   K 3.5 10/26/2019 0935   K 3.2 (L) 08/13/2017 0816   CL 110 10/26/2019 0935   CO2 23 10/26/2019 0935   CO2 26 08/13/2017 0816   BUN 13 10/26/2019 0935   BUN 9.0 08/13/2017 0816   CREATININE 1.12 (H) 10/26/2019 0935   CREATININE 1.0 08/13/2017 0816      Component Value Date/Time   CALCIUM 9.0 10/26/2019 0935   CALCIUM 9.2 08/13/2017 0816   ALKPHOS 71 10/26/2019 0935   ALKPHOS 59 08/13/2017 0816   AST 28 10/26/2019 0935   AST 22 08/13/2017 0816   ALT 33 10/26/2019 0935   ALT 17 08/13/2017 0816   BILITOT 0.7 10/26/2019 0935   BILITOT 0.80 08/13/2017 0816       RADIOGRAPHIC STUDIES: No results found.  ASSESSMENT AND PLAN:  This is a very pleasant 69 years old African-American female with multiple myeloma, IgA subtype status post induction systemic chemotherapy with Carfilzomib, Cytoxan and dexamethasone followed by peripheral blood autologous stem cell transplant followed by 2 years maintenance of treatment with Revlimid. The patient is doing fine with no concerning complaints. Her myeloma panel performed recently showed no concerning findings for progression. I recommended for her to continue on observation with repeat myeloma panel in 4 months. I advised the patient to receive her Covid vaccine when it becomes available for her. She was advised to call immediately if she has any other concerning symptoms in the interval. The patient voices understanding of current disease status and treatment options and is in agreement with the current care plan. All questions were answered. The patient knows to call  the clinic with any problems, questions or concerns. We can certainly see the patient much sooner if necessary.  Disclaimer: This note was dictated with voice recognition software. Similar sounding words can inadvertently be transcribed and may not be corrected upon review.

## 2019-11-02 ENCOUNTER — Telehealth: Payer: Self-pay | Admitting: Internal Medicine

## 2019-11-02 NOTE — Telephone Encounter (Signed)
Scheduled per los. Called and left msg. Mailed printout  °

## 2019-11-07 ENCOUNTER — Other Ambulatory Visit: Payer: Self-pay | Admitting: Family Medicine

## 2019-11-30 ENCOUNTER — Telehealth: Payer: Self-pay

## 2019-11-30 NOTE — Telephone Encounter (Signed)
PA initiated via Covermymeds; KEY: [id number]. PA cancelled- approved through 12/14/2019.

## 2019-12-05 ENCOUNTER — Other Ambulatory Visit: Payer: Self-pay | Admitting: Neurology

## 2019-12-06 ENCOUNTER — Other Ambulatory Visit: Payer: Self-pay | Admitting: Family Medicine

## 2019-12-09 ENCOUNTER — Encounter: Payer: BC Managed Care – PPO | Admitting: Family Medicine

## 2020-01-01 ENCOUNTER — Other Ambulatory Visit: Payer: Self-pay | Admitting: Family Medicine

## 2020-01-10 ENCOUNTER — Ambulatory Visit (INDEPENDENT_AMBULATORY_CARE_PROVIDER_SITE_OTHER): Payer: BC Managed Care – PPO | Admitting: Family Medicine

## 2020-01-10 ENCOUNTER — Other Ambulatory Visit: Payer: Self-pay

## 2020-01-10 ENCOUNTER — Encounter: Payer: Self-pay | Admitting: Family Medicine

## 2020-01-10 VITALS — BP 112/70 | HR 68 | Temp 97.6°F | Resp 18 | Ht 66.0 in | Wt 191.8 lb

## 2020-01-10 DIAGNOSIS — E119 Type 2 diabetes mellitus without complications: Secondary | ICD-10-CM

## 2020-01-10 DIAGNOSIS — G3184 Mild cognitive impairment, so stated: Secondary | ICD-10-CM | POA: Diagnosis not present

## 2020-01-10 DIAGNOSIS — E2839 Other primary ovarian failure: Secondary | ICD-10-CM | POA: Diagnosis not present

## 2020-01-10 DIAGNOSIS — E785 Hyperlipidemia, unspecified: Secondary | ICD-10-CM

## 2020-01-10 DIAGNOSIS — E1165 Type 2 diabetes mellitus with hyperglycemia: Secondary | ICD-10-CM

## 2020-01-10 DIAGNOSIS — C9000 Multiple myeloma not having achieved remission: Secondary | ICD-10-CM

## 2020-01-10 DIAGNOSIS — Z0000 Encounter for general adult medical examination without abnormal findings: Secondary | ICD-10-CM | POA: Diagnosis not present

## 2020-01-10 DIAGNOSIS — I10 Essential (primary) hypertension: Secondary | ICD-10-CM | POA: Diagnosis not present

## 2020-01-10 DIAGNOSIS — Z1231 Encounter for screening mammogram for malignant neoplasm of breast: Secondary | ICD-10-CM

## 2020-01-10 DIAGNOSIS — E1169 Type 2 diabetes mellitus with other specified complication: Secondary | ICD-10-CM | POA: Diagnosis not present

## 2020-01-10 DIAGNOSIS — M810 Age-related osteoporosis without current pathological fracture: Secondary | ICD-10-CM

## 2020-01-10 LAB — COMPREHENSIVE METABOLIC PANEL
ALT: 21 U/L (ref 0–35)
AST: 20 U/L (ref 0–37)
Albumin: 3.7 g/dL (ref 3.5–5.2)
Alkaline Phosphatase: 61 U/L (ref 39–117)
BUN: 9 mg/dL (ref 6–23)
CO2: 27 mEq/L (ref 19–32)
Calcium: 8.8 mg/dL (ref 8.4–10.5)
Chloride: 106 mEq/L (ref 96–112)
Creatinine, Ser: 0.98 mg/dL (ref 0.40–1.20)
GFR: 68.05 mL/min (ref >60.00)
Glucose, Bld: 104 mg/dL — ABNORMAL HIGH (ref 70–99)
Potassium: 3.4 mEq/L — ABNORMAL LOW (ref 3.5–5.1)
Sodium: 140 mEq/L (ref 135–145)
Total Bilirubin: 0.8 mg/dL (ref 0.2–1.2)
Total Protein: 6.6 g/dL (ref 6.0–8.3)

## 2020-01-10 LAB — HEMOGLOBIN A1C: Hgb A1c MFr Bld: 5.8 % (ref 4.6–6.5)

## 2020-01-10 LAB — LIPID PANEL
Cholesterol: 138 mg/dL (ref 0–200)
HDL: 30.7 mg/dL — ABNORMAL LOW (ref >39.00)
LDL Cholesterol: 77 mg/dL (ref 0–99)
NonHDL: 107.53
Total CHOL/HDL Ratio: 5
Triglycerides: 151 mg/dL — ABNORMAL HIGH (ref 0.0–149.0)
VLDL: 30.2 mg/dL (ref 0.0–40.0)

## 2020-01-10 NOTE — Assessment & Plan Note (Signed)
Check bmd 

## 2020-01-10 NOTE — Progress Notes (Signed)
Subjective:     Victoria Gates is a 69 y.o. female and is here for a comprehensive physical exam. The patient reports no new problems-- pt under care of neuro and neuro psych for neuro cognitive impariment ---- seems to be worsening .  Her husband is not with her today--- her sister brought her and is in the waiting room   Social History   Socioeconomic History  . Marital status: Married    Spouse name: Not on file  . Number of children: Not on file  . Years of education: Not on file  . Highest education level: Not on file  Occupational History  . Occupation: MAIL Armed forces operational officer: [employer]  Tobacco Use  . Smoking status: Never Smoker  . Smokeless tobacco: Never Used  Substance and Sexual Activity  . Alcohol use: No  . Drug use: No  . Sexual activity: Yes  Other Topics Concern  . Not on file  Social History Narrative   Lives in [place] with spouse.   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   Social Determinants of Health   Financial Resource Strain:   . Difficulty of Paying Living Expenses:   Food Insecurity:   . Worried About Charity fundraiser in the Last Year:   . Arboriculturist in the Last Year:   Transportation Needs:   . Film/video editor (Medical):   Marland Kitchen Lack of Transportation (Non-Medical):   Physical Activity:   . Days of Exercise per Week:   . Minutes of Exercise per Session:   Stress:   . Feeling of Stress :   Social Connections:   . Frequency of Communication with Friends and Family:   . Frequency of Social Gatherings with Friends and Family:   . Attends Religious Services:   . Active Member of Clubs or Organizations:   . Attends Archivist Meetings:   Marland Kitchen Marital Status:   Intimate Partner Violence:   . Fear of Current or Ex-Partner:   . Emotionally Abused:   Marland Kitchen Physically Abused:   . Sexually Abused:    Health Maintenance  Topic Date Due  . URINE MICROALBUMIN  09/01/2018  . HEMOGLOBIN A1C  03/07/2020  .  OPHTHALMOLOGY EXAM  03/21/2020  . INFLUENZA VACCINE  04/30/2020  . FOOT EXAM  09/06/2020  . MAMMOGRAM  10/07/2020  . COLONOSCOPY  06/10/2022  . TETANUS/TDAP  09/02/2027  . DEXA SCAN  Completed  . Hepatitis C Screening  Completed  . PNA vac Low Risk Adult  Completed    The following portions of the patient's history were reviewed and updated as appropriate:  She  has a past medical history of Allergic rhinitis (01/02/2009), Bradycardia (04/29/2016), Cervical Radiculopathy (02/01/2010), Chest pain (04/15/2012), Diabetes mellitus, type II (03/18/2008), Encounter for antineoplastic chemotherapy (04/29/2016), Esophageal Stricture (04/15/2007), GERD (gastroesophageal reflux disease) (09/21/2010), Herpes zoster (11/05/2010), Hiatal Hernia (04/15/2007), History of colonic polyps (04/24/2007), Hypercholesterolemia (07/03/2009), Hypokalemia (11/30/2015), Iron deficiency anemia (04/24/2007), Multiple myeloma ([place]) (02/2014), and Osteoporosis (04/24/2007). She does not have any pertinent problems on file. She  has a past surgical history that includes Tubal ligation and Esophagogastroduodenoscopy (04/15/2007). Her family history includes Brain cancer in her mother; Diabetes in her brother and sister; Heart disease in her brother, father, mother, and sister; Lung cancer in her father. She  reports that she has never smoked. She has never used smokeless tobacco. She reports that she does not drink alcohol or use drugs. She has  a current medication list which includes the following prescription(s): vitamin c, calcium, cholecalciferol, donepezil, lansoprazole, livalo, and potassium chloride er. Current Outpatient Medications on File Prior to Visit  Medication Sig Dispense Refill  . Ascorbic Acid (VITAMIN C) 1000 MG tablet Take 1,000 mg by mouth daily.     . Calcium 500-125 MG-UNIT TABS Take 1 tablet by mouth daily.      . cholecalciferol (VITAMIN D) 1000 UNITS tablet Take 1,000 Units by mouth daily.    Marland Kitchen donepezil  (ARICEPT) 5 MG tablet TAKE 1 TAB BY MOUTH AT BEDTIME X30 DAYS, THEN TAKE 2 TABS AT BEDTIME 90 tablet 2  . lansoprazole (PREVACID) 30 MG capsule Take 30 mg by mouth daily with breakfast.     . LIVALO 1 MG TABS TAKE 1 TABLET (1 MG TOTAL) BY MOUTH AT BEDTIME. NEED OFFICE VISIT 30 tablet 1  . Potassium Chloride ER 20 MEQ TBCR Take 20 mEq by mouth daily. 7 tablet 0   No current facility-administered medications on file prior to visit.   She is allergic to atorvastatin; rosuvastatin; and sulfonamide derivatives..  Review of Systems Review of Systems  Constitutional: Negative for activity change, appetite change and fatigue.  HENT: Negative for hearing loss, congestion, tinnitus and ear discharge.  dentist q40mEyes: Negative for visual disturbance (see optho q1y -- vision corrected to 20/20 with glasses).  Respiratory: Negative for cough, chest tightness and shortness of breath.   Cardiovascular: Negative for chest pain, palpitations and leg swelling.  Gastrointestinal: Negative for abdominal pain, diarrhea, constipation and abdominal distention.  Genitourinary: Negative for urgency, frequency, decreased urine volume and difficulty urinating.  Musculoskeletal: Negative for back pain, arthralgias and gait problem.  Skin: Negative for color change, pallor and rash.  Neurological: Negative for dizziness, light-headedness, numbness and headaches.  Hematological: Negative for adenopathy. Does not bruise/bleed easily.  Psychiatric/Behavioral: Negative for suicidal ideas,  sleep disturbance, self-injury, dysphoric mood,  Worsening confusion/ memory   Objective:    BP 112/70 (BP Location: Right Arm, Patient Position: Sitting, Cuff Size: Normal)   Pulse 68   Temp 97.6 F (36.4 C) (Temporal)   Resp 18   Ht '5\' 6"'$  (1.676 m)   Wt 191 lb 12.8 oz (87 kg)   SpO2 96%   BMI 30.96 kg/m  General appearance: alert, cooperative, appears stated age and no distress Head: Normocephalic, without obvious  abnormality, atraumatic Eyes: negative findings: lids and lashes normal, conjunctivae and sclerae normal and pupils equal, round, reactive to light and accomodation Ears: normal TM's and external ear canals both ears Neck: no adenopathy, no carotid bruit, no JVD, supple, symmetrical, trachea midline and thyroid not enlarged, symmetric, no tenderness/mass/nodules Back: symmetric, no curvature. ROM normal. No CVA tenderness. Lungs: clear to auscultation bilaterally Breasts: normal appearance, no masses or tenderness Heart: regular rate and rhythm, S1, S2 normal, no murmur, click, rub or gallop Abdomen: soft, non-tender; bowel sounds normal; no masses,  no organomegaly Pelvic: not indicated; post-menopausal, no abnormal Pap smears in past Extremities: extremities normal, atraumatic, no cyanosis or edema Pulses: 2+ and symmetric Skin: Skin color, texture, turgor normal. No rashes or lesions Lymph nodes: Cervical, supraclavicular, and axillary nodes normal. Neurologic: Mental status: memory worsening-    Assessment:    Healthy female exam.     Plan:    ghm utd Check labs  See After Visit Summary for Counseling Recommendations    1. Uncontrolled type 2 diabetes mellitus with hyperglycemia ([place] Per endo  Check labs  - Hemoglobin A1c  2. Hyperlipidemia associated with type 2 diabetes mellitus ([place]) Tolerating statin, encouraged heart healthy diet, avoid trans fats, minimize simple carbs and saturated fats. Increase exercise as tolerated - Lipid panel - Hemoglobin A1c - Comprehensive metabolic panel  3. Preventative health care See above  - Lipid panel - Hemoglobin A1c - Comprehensive metabolic panel  4. Estrogen deficiency bmd  - DG Bone Density; Future  5. Encounter for screening mammogram for malignant neoplasm of breast   - MM DIGITAL SCREENING BILATERAL; Future  6. Mild neurocognitive disorder, severe Per neuro and neuropsych  7. Essential hypertension Well  controlled, no changes to meds. Encouraged heart healthy diet such as the DASH diet and exercise as tolerated.    8. Type 2 diabetes mellitus without complication, without long-term current use of insulin ([place]) Per endo  9. Osteoporosis, unspecified osteoporosis type, unspecified pathological fracture presence bmd ordered

## 2020-01-10 NOTE — Patient Instructions (Signed)
Preventive Care 69 Years and Older, Female Preventive care refers to lifestyle choices and visits with your health care provider that can promote health and wellness. This includes:  A yearly physical exam. This is also called an annual well check.  Regular dental and eye exams.  Immunizations.  Screening for certain conditions.  Healthy lifestyle choices, such as diet and exercise. What can I expect for my preventive care visit? Physical exam Your health care provider will check:  Height and weight. These may be used to calculate body mass index (BMI), which is a measurement that tells if you are at a healthy weight.  Heart rate and blood pressure.  Your skin for abnormal spots. Counseling Your health care provider may ask you questions about:  Alcohol, tobacco, and drug use.  Emotional well-being.  Home and relationship well-being.  Sexual activity.  Eating habits.  History of falls.  Memory and ability to understand (cognition).  Work and work Statistician.  Pregnancy and menstrual history. What immunizations do I need?  Influenza (flu) vaccine  This is recommended every year. Tetanus, diphtheria, and pertussis (Tdap) vaccine  You may need a Td booster every 10 years. Varicella (chickenpox) vaccine  You may need this vaccine if you have not already been vaccinated. Zoster (shingles) vaccine  You may need this after age 69. Pneumococcal conjugate (PCV13) vaccine  One dose is recommended after age 69. Pneumococcal polysaccharide (PPSV23) vaccine  One dose is recommended after age 69. Measles, mumps, and rubella (MMR) vaccine  You may need at least one dose of MMR if you were born in 1957 or later. You may also need a second dose. Meningococcal conjugate (MenACWY) vaccine  You may need this if you have certain conditions. Hepatitis A vaccine  You may need this if you have certain conditions or if you travel or work in places where you may be exposed  to hepatitis A. Hepatitis B vaccine  You may need this if you have certain conditions or if you travel or work in places where you may be exposed to hepatitis B. Haemophilus influenzae type b (Hib) vaccine  You may need this if you have certain conditions. You may receive vaccines as individual doses or as more than one vaccine together in one shot (combination vaccines). Talk with your health care provider about the risks and benefits of combination vaccines. What tests do I need? Blood tests  Lipid and cholesterol levels. These may be checked every 5 years, or more frequently depending on your overall health.  Hepatitis C test.  Hepatitis B test. Screening  Lung cancer screening. You may have this screening every year starting at age 69 if you have a 30-pack-year history of smoking and currently smoke or have quit within the past 15 years.  Colorectal cancer screening. All adults should have this screening starting at age 69 and continuing until age 15. Your health care provider may recommend screening at age 69 if you are at increased risk. You will have tests every 1-10 years, depending on your results and the type of screening test.  Diabetes screening. This is done by checking your blood sugar (glucose) after you have not eaten for a while (fasting). You may have this done every 1-3 years.  Mammogram. This may be done every 1-2 years. Talk with your health care provider about how often you should have regular mammograms.  BRCA-related cancer screening. This may be done if you have a family history of breast, ovarian, tubal, or peritoneal cancers.  Other tests  Sexually transmitted disease (STD) testing.  Bone density scan. This is done to screen for osteoporosis. You may have this done starting at age 69. Follow these instructions at home: Eating and drinking  Eat a diet that includes fresh fruits and vegetables, whole grains, lean protein, and low-fat dairy products. Limit  your intake of foods with high amounts of sugar, saturated fats, and salt.  Take vitamin and mineral supplements as recommended by your health care provider.  Do not drink alcohol if your health care provider tells you not to drink.  If you drink alcohol: ? Limit how much you have to 0-1 drink a day. ? Be aware of how much alcohol is in your drink. In the [place] one drink equals one 12 oz bottle of beer (355 mL), one 5 oz glass of wine (148 mL), or one 1 oz glass of hard liquor (44 mL). Lifestyle  Take daily care of your teeth and gums.  Stay active. Exercise for at least 30 minutes on 5 or more days each week.  Do not use any products that contain nicotine or tobacco, such as cigarettes, e-cigarettes, and chewing tobacco. If you need help quitting, ask your health care provider.  If you are sexually active, practice safe sex. Use a condom or other form of protection in order to prevent STIs (sexually transmitted infections).  Talk with your health care provider about taking a low-dose aspirin or statin. What's next?  Go to your health care provider once a year for a well check visit.  Ask your health care provider how often you should have your eyes and teeth checked.  Stay up to date on all vaccines. This information is not intended to replace advice given to you by your health care provider. Make sure you discuss any questions you have with your health care provider. Document Revised: 09/10/2018 Document Reviewed: 09/10/2018 Elsevier Patient Education  2020 [employer].

## 2020-01-10 NOTE — Assessment & Plan Note (Signed)
Well controlled, no changes to meds. Encouraged heart healthy diet such as the DASH diet and exercise as tolerated.  °

## 2020-01-10 NOTE — Assessment & Plan Note (Signed)
Per oncology °

## 2020-01-10 NOTE — Assessment & Plan Note (Signed)
Worsening F/u neuro and neuro psych D/w pt sister---  It will be important that a family member come to office with her from now on

## 2020-01-10 NOTE — Assessment & Plan Note (Signed)
Per endo °

## 2020-01-30 ENCOUNTER — Other Ambulatory Visit: Payer: Self-pay | Admitting: Family Medicine

## 2020-02-14 ENCOUNTER — Telehealth: Payer: Self-pay | Admitting: Internal Medicine

## 2020-02-14 ENCOUNTER — Inpatient Hospital Stay: Payer: BC Managed Care – PPO | Attending: Internal Medicine

## 2020-02-14 ENCOUNTER — Other Ambulatory Visit: Payer: Self-pay

## 2020-02-14 DIAGNOSIS — Z79899 Other long term (current) drug therapy: Secondary | ICD-10-CM | POA: Diagnosis not present

## 2020-02-14 DIAGNOSIS — C9000 Multiple myeloma not having achieved remission: Secondary | ICD-10-CM

## 2020-02-14 DIAGNOSIS — E876 Hypokalemia: Secondary | ICD-10-CM | POA: Diagnosis not present

## 2020-02-14 DIAGNOSIS — Z9221 Personal history of antineoplastic chemotherapy: Secondary | ICD-10-CM | POA: Insufficient documentation

## 2020-02-14 LAB — CMP (CANCER CENTER ONLY)
ALT: 29 U/L (ref 0–44)
AST: 27 U/L (ref 15–41)
Albumin: 3.5 g/dL (ref 3.5–5.0)
Alkaline Phosphatase: 68 U/L (ref 38–126)
Anion gap: 8 (ref 5–15)
BUN: 8 mg/dL (ref 8–23)
CO2: 24 mmol/L (ref 22–32)
Calcium: 8.9 mg/dL (ref 8.9–10.3)
Chloride: 109 mmol/L (ref 98–111)
Creatinine: 1.03 mg/dL — ABNORMAL HIGH (ref 0.44–1.00)
GFR, Est AFR Am: >60 mL/min (ref >60)
GFR, Estimated: 55 mL/min — ABNORMAL LOW (ref >60)
Glucose, Bld: 96 mg/dL (ref 70–99)
Potassium: 3.5 mmol/L (ref 3.5–5.1)
Sodium: 141 mmol/L (ref 135–145)
Total Bilirubin: 0.4 mg/dL (ref 0.3–1.2)
Total Protein: 7.3 g/dL (ref 6.5–8.1)

## 2020-02-14 LAB — CBC WITH DIFFERENTIAL (CANCER CENTER ONLY)
Abs Immature Granulocytes: 0.01 10*3/uL (ref 0.00–0.07)
Basophils Absolute: 0.1 10*3/uL (ref 0.0–0.1)
Basophils Relative: 1 %
Eosinophils Absolute: 0.1 10*3/uL (ref 0.0–0.5)
Eosinophils Relative: 2 %
HCT: 37.6 % (ref 36.0–46.0)
Hemoglobin: 12.3 g/dL (ref 12.0–15.0)
Immature Granulocytes: 0 %
Lymphocytes Relative: 46 %
Lymphs Abs: 3.2 10*3/uL (ref 0.7–4.0)
MCH: 28.3 pg (ref 26.0–34.0)
MCHC: 32.7 g/dL (ref 30.0–36.0)
MCV: 86.6 fL (ref 80.0–100.0)
Monocytes Absolute: 0.6 10*3/uL (ref 0.1–1.0)
Monocytes Relative: 8 %
Neutro Abs: 3 10*3/uL (ref 1.7–7.7)
Neutrophils Relative %: 43 %
Platelet Count: 175 10*3/uL (ref 150–400)
RBC: 4.34 MIL/uL (ref 3.87–5.11)
RDW: 13.3 % (ref 11.5–15.5)
WBC Count: 7 10*3/uL (ref 4.0–10.5)
nRBC: 0 % (ref 0.0–0.2)

## 2020-02-14 LAB — LACTATE DEHYDROGENASE: LDH: 139 U/L (ref 98–192)

## 2020-02-14 NOTE — Telephone Encounter (Signed)
Called pt per 5/17 sch message- no answer. Left message for patient to call back to reschedule appt .

## 2020-02-15 ENCOUNTER — Inpatient Hospital Stay: Payer: BC Managed Care – PPO

## 2020-02-15 LAB — KAPPA/LAMBDA LIGHT CHAINS
Kappa free light chain: 24.6 mg/L — ABNORMAL HIGH (ref 3.3–19.4)
Kappa, lambda light chain ratio: 1.17 (ref 0.26–1.65)
Lambda free light chains: 21 mg/L (ref 5.7–26.3)

## 2020-02-15 LAB — IGG, IGA, IGM
IgA: 395 mg/dL — ABNORMAL HIGH (ref 87–352)
IgG (Immunoglobin G), Serum: 1405 mg/dL (ref 586–1602)
IgM (Immunoglobulin M), Srm: 33 mg/dL (ref 26–217)

## 2020-02-16 ENCOUNTER — Inpatient Hospital Stay: Payer: BC Managed Care – PPO

## 2020-02-16 LAB — BETA 2 MICROGLOBULIN, SERUM: Beta-2 Microglobulin: 1.4 mg/L (ref 0.6–2.4)

## 2020-02-23 ENCOUNTER — Other Ambulatory Visit: Payer: Self-pay | Admitting: Family Medicine

## 2020-02-23 ENCOUNTER — Other Ambulatory Visit: Payer: Self-pay

## 2020-02-23 ENCOUNTER — Encounter: Payer: Self-pay | Admitting: Internal Medicine

## 2020-02-23 ENCOUNTER — Inpatient Hospital Stay (HOSPITAL_BASED_OUTPATIENT_CLINIC_OR_DEPARTMENT_OTHER): Payer: BC Managed Care – PPO | Admitting: Internal Medicine

## 2020-02-23 VITALS — BP 120/77 | HR 75 | Temp 97.9°F | Resp 18 | Ht 66.0 in | Wt 188.1 lb

## 2020-02-23 DIAGNOSIS — C9000 Multiple myeloma not having achieved remission: Secondary | ICD-10-CM | POA: Diagnosis not present

## 2020-02-23 DIAGNOSIS — C9001 Multiple myeloma in remission: Secondary | ICD-10-CM

## 2020-02-23 DIAGNOSIS — I10 Essential (primary) hypertension: Secondary | ICD-10-CM

## 2020-02-23 NOTE — Progress Notes (Signed)
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  PRIOR THERAPY: 1) Systemic chemotherapy with Carfilzomib, Cytoxan and dexamethasone. First dose on 11/15/2013. Status post 5 cycles. 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 3) Maintenance Revlimid 10 mg by mouth daily. Status post 2 months of treatment. 4) Maintenance Revlimid 15 mg by mouth daily. Status post 4 months of treatment. 5) Maintenance treatment with Revlimid 10 mg by mouth daily status post 17 months. She completed 2 years of treatment in February 2018.  CURRENT THERAPY: Observation.  INTERVAL HISTORY: Victoria Gates 69 y.o. female returns to the clinic today for follow-up visit accompanied by her husband.  The patient is feeling fine today with no concerning complaints.  She denied having any significant fatigue or weakness.  She denied having any recent weight loss or night sweats.  She has no nausea, vomiting, diarrhea or constipation.  She denied having any headache or visual changes.  She is here today for evaluation with repeat myeloma panel.  MEDICAL HISTORY: Past Medical History:  Diagnosis Date  . Allergic rhinitis 01/02/2009  . Bradycardia 04/29/2016  . Cervical Radiculopathy 02/01/2010   Left side  . Chest pain 04/15/2012   Ex MV: Ex time 10 mins, EF 63%, no ischemia/infarct. Occas PAC's/PVC's.  . Diabetes mellitus, type II 03/18/2008  . Encounter for antineoplastic chemotherapy 04/29/2016  . Esophageal Stricture 04/15/2007   s/p dilitation  . GERD (gastroesophageal reflux disease) 09/21/2010  . Herpes zoster 11/05/2010  . Hiatal Hernia 04/15/2007  . History of colonic polyps 04/24/2007   Hyperplastic only  . Hypercholesterolemia 07/03/2009  . Hypokalemia 11/30/2015  . Iron  deficiency anemia 04/24/2007  . Multiple myeloma ([place]) 02/2014  . Osteoporosis 04/24/2007    ALLERGIES:  is allergic to atorvastatin; rosuvastatin; and sulfonamide derivatives.  MEDICATIONS:  Current Outpatient Medications  Medication Sig Dispense Refill  . Ascorbic Acid (VITAMIN C) 1000 MG tablet Take 1,000 mg by mouth daily.     . Calcium 500-125 MG-UNIT TABS Take 1 tablet by mouth daily.      . cholecalciferol (VITAMIN D) 1000 UNITS tablet Take 1,000 Units by mouth daily.    Marland Kitchen donepezil (ARICEPT) 5 MG tablet TAKE 1 TAB BY MOUTH AT BEDTIME X30 DAYS, THEN TAKE 2 TABS AT BEDTIME 90 tablet 2  . lansoprazole (PREVACID) 30 MG capsule Take 30 mg by mouth daily with breakfast.     . Pitavastatin Calcium (LIVALO) 1 MG TABS Take 1 tablet (1 mg total) by mouth at bedtime. 30 tablet 1  . Potassium Chloride ER 20 MEQ TBCR Take 20 mEq by mouth daily. 7 tablet 0   No current facility-administered medications for this visit.    SURGICAL HISTORY:  Past Surgical History:  Procedure Laterality Date  . ESOPHAGOGASTRODUODENOSCOPY  04/15/2007  . TUBAL LIGATION      REVIEW OF SYSTEMS:  A comprehensive review of systems was negative.   PHYSICAL EXAMINATION: General appearance: alert, cooperative and no distress Head: Normocephalic, without obvious abnormality, atraumatic Neck: no adenopathy, no JVD, supple, symmetrical, trachea midline and thyroid not enlarged, symmetric, no tenderness/mass/nodules Lymph nodes: Cervical, supraclavicular, and axillary nodes normal. Resp: clear to auscultation bilaterally Back: symmetric, no curvature. ROM normal. No CVA tenderness. Cardio: regular rate and rhythm, S1, S2 normal, no murmur,  click, rub or gallop GI: soft, non-tender; bowel sounds normal; no masses,  no organomegaly Extremities: extremities normal, atraumatic, no cyanosis or edema  ECOG PERFORMANCE STATUS: 0 - Asymptomatic  Blood pressure 120/77, pulse 75, temperature 97.9 F (36.6 C), temperature  source Temporal, resp. rate 18, height _0  (1.676 m), weight 188 lb 1.6 oz (85.3 kg), SpO2 98 %.  LABORATORY DATA: Lab Results  Component Value Date   WBC 7.0 02/14/2020   HGB 12.3 02/14/2020   HCT 37.6 02/14/2020   MCV 86.6 02/14/2020   PLT 175 02/14/2020      Chemistry      Component Value Date/Time   NA 141 02/14/2020 1502   NA 139 08/13/2017 0816   K 3.5 02/14/2020 1502   K 3.2 (L) 08/13/2017 0816   CL 109 02/14/2020 1502   CO2 24 02/14/2020 1502   CO2 26 08/13/2017 0816   BUN 8 02/14/2020 1502   BUN 9.0 08/13/2017 0816   CREATININE 1.03 (H) 02/14/2020 1502   CREATININE 1.0 08/13/2017 0816      Component Value Date/Time   CALCIUM 8.9 02/14/2020 1502   CALCIUM 9.2 08/13/2017 0816   ALKPHOS 68 02/14/2020 1502   ALKPHOS 59 08/13/2017 0816   AST 27 02/14/2020 1502   AST 22 08/13/2017 0816   ALT 29 02/14/2020 1502   ALT 17 08/13/2017 0816   BILITOT 0.4 02/14/2020 1502   BILITOT 0.80 08/13/2017 0816       RADIOGRAPHIC STUDIES: No results found.  ASSESSMENT AND PLAN:  This is a very pleasant 69 years old African-American female with multiple myeloma, IgA subtype status post induction systemic chemotherapy with Carfilzomib, Cytoxan and dexamethasone followed by peripheral blood autologous stem cell transplant followed by 2 years maintenance of treatment with Revlimid. The patient is currently on observation and she is feeling fine today with no concerning complaints. She had repeat myeloma panel performed recently.  I discussed the lab results with the patient and her husband.  Her myeloma panel showed no concerning findings for progression. I recommended for her to continue on observation with repeat myeloma panel in 4 months. She was advised to call immediately if she has any concerning symptoms in the interval. The patient voices understanding of current disease status and treatment options and is in agreement with the current care plan. All questions were  answered. The patient knows to call the clinic with any problems, questions or concerns. We can certainly see the patient much sooner if necessary.  Disclaimer: This note was dictated with voice recognition software. Similar sounding words can inadvertently be transcribed and may not be corrected upon review.

## 2020-02-29 ENCOUNTER — Other Ambulatory Visit: Payer: Self-pay

## 2020-03-02 ENCOUNTER — Ambulatory Visit (INDEPENDENT_AMBULATORY_CARE_PROVIDER_SITE_OTHER): Payer: BC Managed Care – PPO | Admitting: Endocrinology

## 2020-03-02 ENCOUNTER — Other Ambulatory Visit: Payer: Self-pay

## 2020-03-02 ENCOUNTER — Encounter: Payer: Self-pay | Admitting: Endocrinology

## 2020-03-02 VITALS — BP 126/70 | HR 70 | Ht 66.0 in | Wt 185.8 lb

## 2020-03-02 DIAGNOSIS — E119 Type 2 diabetes mellitus without complications: Secondary | ICD-10-CM

## 2020-03-02 LAB — POCT GLYCOSYLATED HEMOGLOBIN (HGB A1C): Hemoglobin A1C: 5.5 % (ref 4.0–5.6)

## 2020-03-02 NOTE — Progress Notes (Signed)
Subjective:    Patient ID: Victoria Gates, female    DOB: September 21, 1951, 69 y.o.   MRN: [id number]  HPI Pt returns for f/u of diabetes mellitus:  DM type: 2 Dx'ed: 3646 Complications: stage 2 CRI.     Therapy: no medication now.   GDM: never DKA: never Severe hypoglycemia: never Pancreatitis: never Other: she has never been on insulin.   Interval history: pt states she feels well in general.  Past Medical History:  Diagnosis Date   Allergic rhinitis 01/02/2009   Bradycardia 04/29/2016   Cervical Radiculopathy 02/01/2010   Left side   Chest pain 04/15/2012   Ex MV: Ex time 10 mins, EF 63%, no ischemia/infarct. Occas PAC's/PVC's.   Diabetes mellitus, type II 03/18/2008   Encounter for antineoplastic chemotherapy 04/29/2016   Esophageal Stricture 04/15/2007   s/p dilitation   GERD (gastroesophageal reflux disease) 09/21/2010   Herpes zoster 11/05/2010   Hiatal Hernia 04/15/2007   History of colonic polyps 04/24/2007   Hyperplastic only   Hypercholesterolemia 07/03/2009   Hypokalemia 11/30/2015   Iron deficiency anemia 04/24/2007   Multiple myeloma ([place]) 02/2014   Osteoporosis 04/24/2007    Past Surgical History:  Procedure Laterality Date   ESOPHAGOGASTRODUODENOSCOPY  04/15/2007   TUBAL LIGATION      Social History   Socioeconomic History   Marital status: Married    Spouse name: Not on file   Number of children: Not on file   Years of education: Not on file   Highest education level: Not on file  Occupational History   Occupation: MAIL Armed forces operational officer: [address]  Tobacco Use   Smoking status: Never Smoker   Smokeless tobacco: Never Used  Substance and Sexual Activity   Alcohol use: No   Drug use: No   Sexual activity: Yes  Other Topics Concern   Not on file  Social History Narrative   Lives in [place] with spouse.   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   Social Determinants of Health   Financial  Resource Strain:    Difficulty of Paying Living Expenses:   Food Insecurity:    Worried About Charity fundraiser in the Last Year:    Arboriculturist in the Last Year:   Transportation Needs:    Film/video editor (Medical):    Lack of Transportation (Non-Medical):   Physical Activity:    Days of Exercise per Week:    Minutes of Exercise per Session:   Stress:    Feeling of Stress :   Social Connections:    Frequency of Communication with Friends and Family:    Frequency of Social Gatherings with Friends and Family:    Attends Religious Services:    Active Member of Clubs or Organizations:    Attends Music therapist:    Marital Status:   Intimate Partner Violence:    Fear of Current or Ex-Partner:    Emotionally Abused:    Physically Abused:    Sexually Abused:     Current Outpatient Medications on File Prior to Visit  Medication Sig Dispense Refill   Ascorbic Acid (VITAMIN C) 1000 MG tablet Take 1,000 mg by mouth daily.      Calcium 500-125 MG-UNIT TABS Take 1 tablet by mouth daily.       cholecalciferol (VITAMIN D) 1000 UNITS tablet Take 1,000 Units by mouth daily.     donepezil (ARICEPT) 5 MG tablet TAKE 1 TAB BY MOUTH  AT BEDTIME X30 DAYS, THEN TAKE 2 TABS AT BEDTIME 90 tablet 2   lansoprazole (PREVACID) 30 MG capsule Take 30 mg by mouth daily with breakfast.      LIVALO 1 MG TABS TAKE 1 TABLET (1 MG TOTAL) BY MOUTH AT BEDTIME. 30 tablet 1   Potassium Chloride ER 20 MEQ TBCR Take 20 mEq by mouth daily. 7 tablet 0   No current facility-administered medications on file prior to visit.    Allergies  Allergen Reactions   Atorvastatin     REACTION: myalgias   Rosuvastatin     REACTION: myalgias   Sulfonamide Derivatives     Unknown reaction     Family History  Problem Relation Age of Onset   Brain cancer Mother    Heart disease Mother    Lung cancer Father    Heart disease Father    Diabetes Sister    Heart  disease Sister    Diabetes Brother    Heart disease Brother    Cancer Neg Hx        No FH of Colon Cancer   Colon cancer Neg Hx    Esophageal cancer Neg Hx    Rectal cancer Neg Hx    Stomach cancer Neg Hx     BP 126/70 (BP Location: Left Arm, Patient Position: Sitting, Cuff Size: Normal)    Pulse 70    Ht _0  (1.676 m)    Wt 185 lb 12.8 oz (84.3 kg)    SpO2 96%    BMI 29.99 kg/m    Review of Systems     Objective:   Physical Exam VITAL SIGNS:  See vs page GENERAL: no distress Pulses: dorsalis pedis intact bilat.   MSK: no deformity of the feet CV: no leg edema Skin:  no ulcer on the feet.  normal color and temp on the feet. Neuro: sensation is intact to touch on the feet  Lab Results  Component Value Date   HGBA1C 5.5 03/02/2020   Lab Results  Component Value Date   CREATININE 1.03 (H) 02/14/2020   BUN 8 02/14/2020   NA 141 02/14/2020   K 3.5 02/14/2020   CL 109 02/14/2020   CO2 24 02/14/2020       Assessment & Plan:  Type 2 DM: stable off rx.    Patient Instructions  good diet and exercise significantly improve the control of your diabetes.  You don't need any medication for the blood sugar now.   Please come back for a follow-up appointment in 6 months.

## 2020-03-02 NOTE — Patient Instructions (Addendum)
good diet and exercise significantly improve the control of your diabetes.  You don't need any medication for the blood sugar now.   Please come back for a follow-up appointment in 6 months.

## 2020-03-03 ENCOUNTER — Telehealth: Payer: Self-pay | Admitting: Internal Medicine

## 2020-03-03 NOTE — Telephone Encounter (Signed)
Scheduled per los. Called and left msg. Mailed printout  °

## 2020-03-11 IMAGING — DX DG CHEST 2V
2 series · 2 of 2 positions shown · non-contrast
Comparison: 05/09/2016

CLINICAL DATA: Left-sided chest pain

EXAM:
CHEST - 2 VIEW

[chest lat]
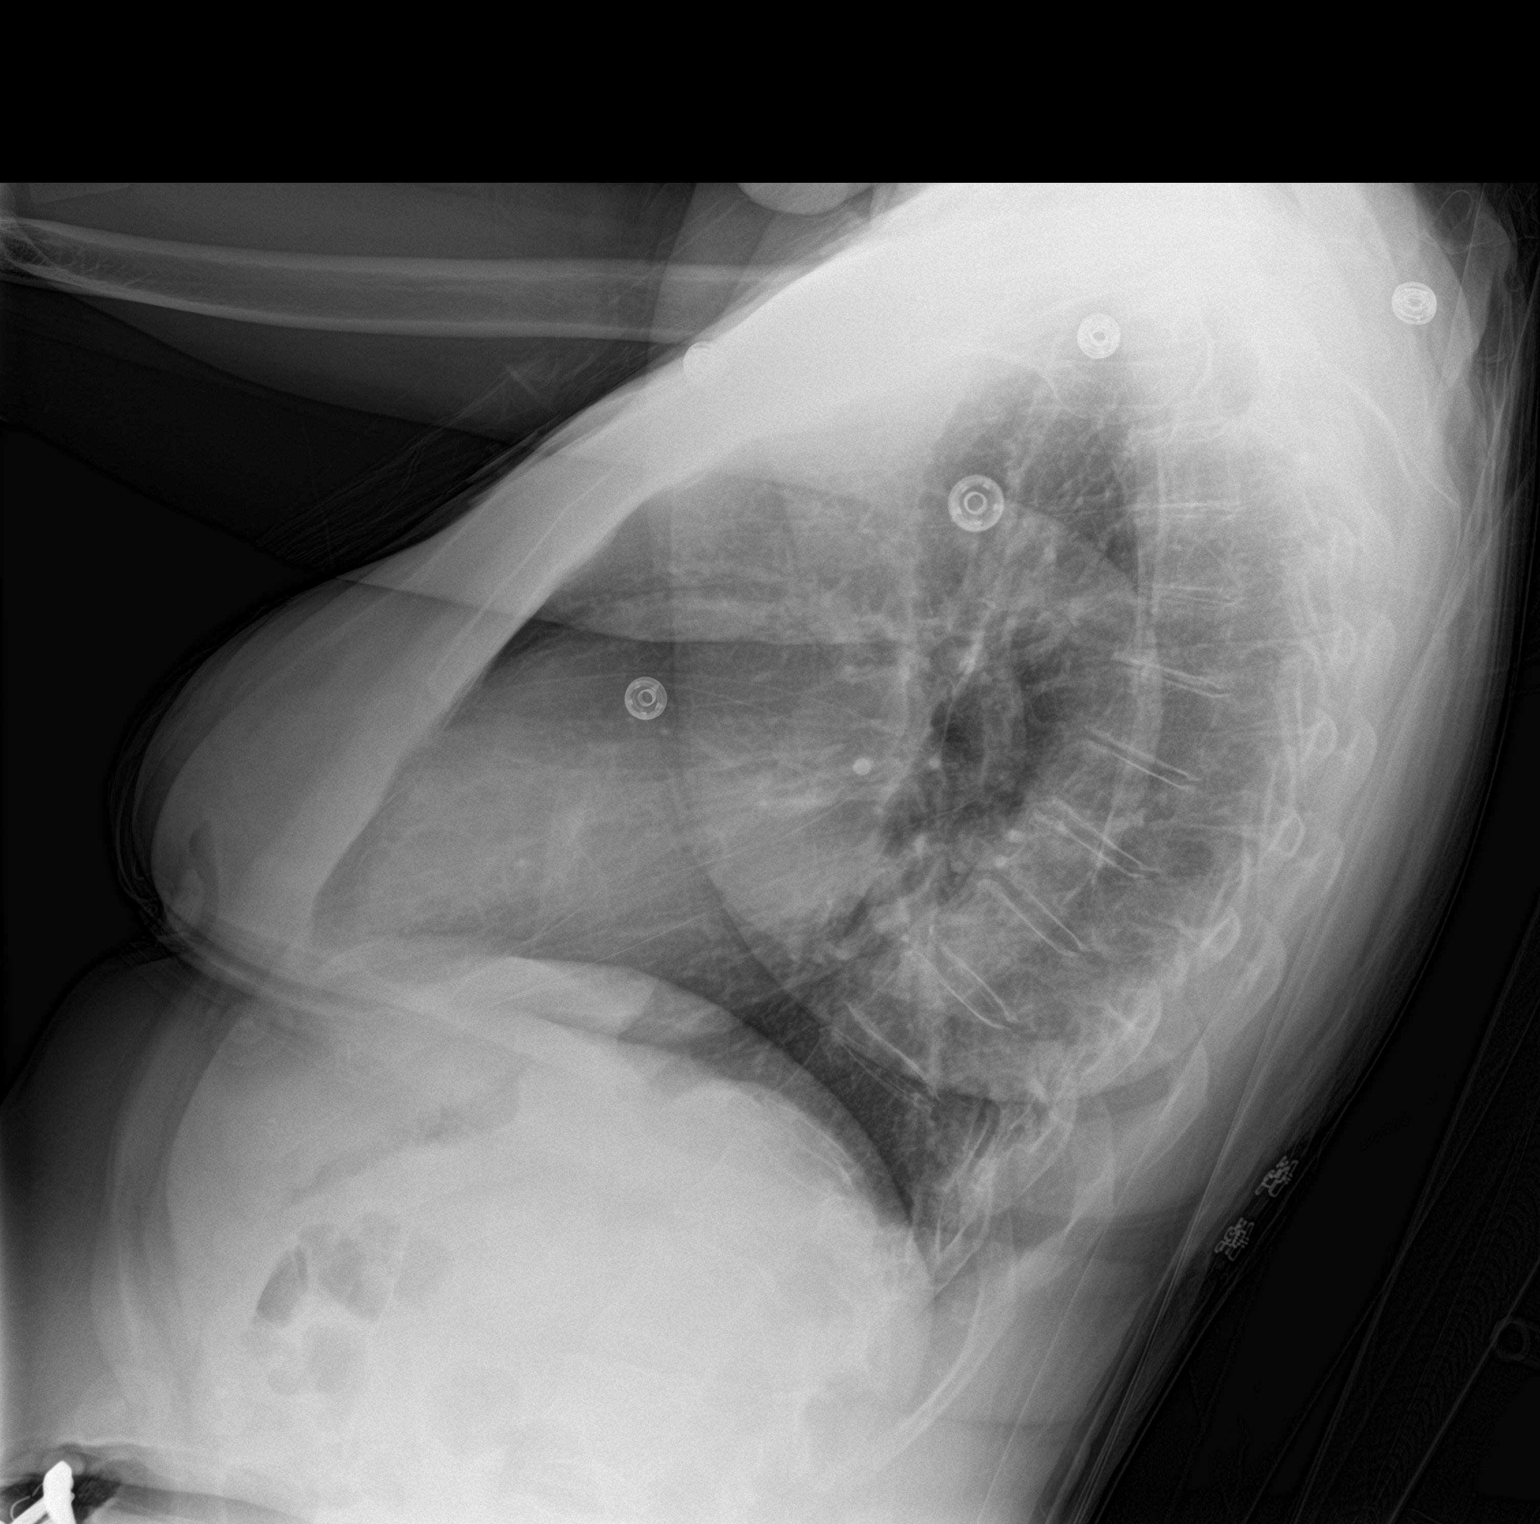

[chest ap]
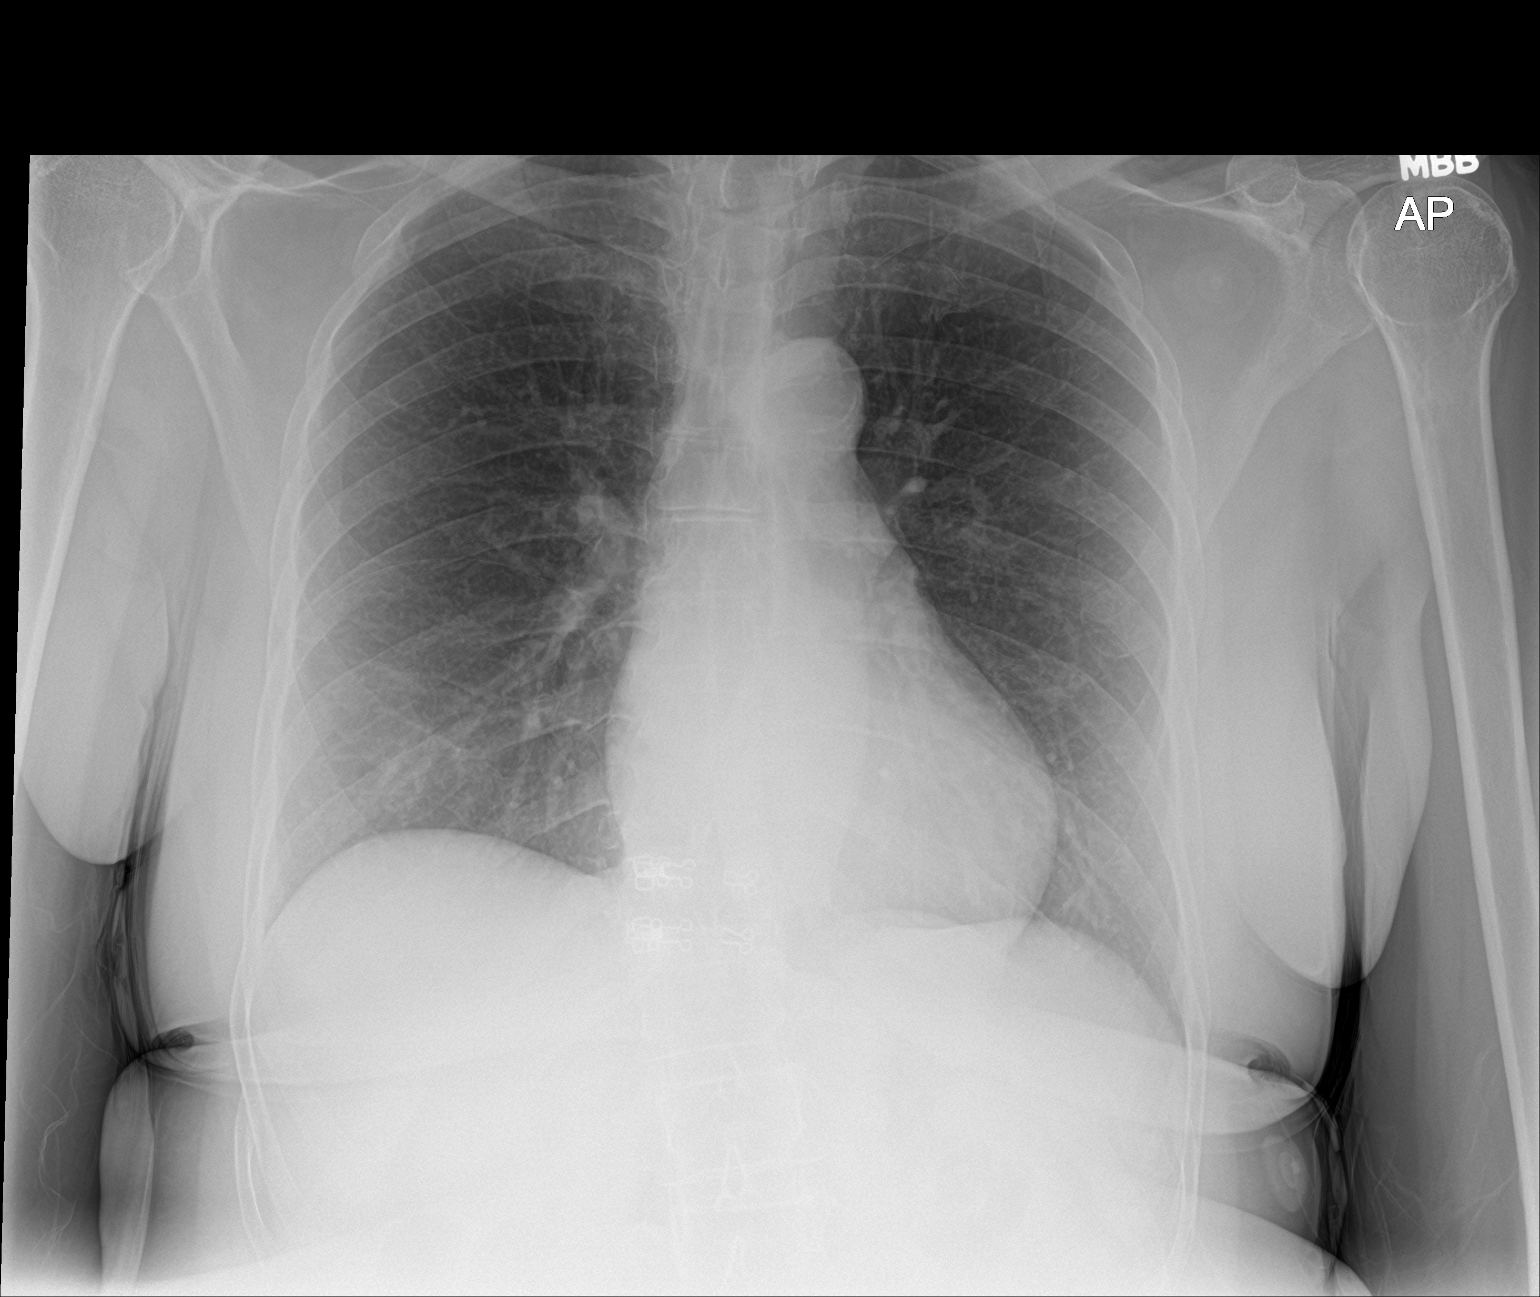

[2 of 2 positions shown; findings below may reference images not displayed]

FINDINGS: Cardiac shadow is within normal limits. The lungs are well aerated
bilaterally. No focal infiltrate or pneumothorax is seen. No acute
bony abnormality is noted.
IMPRESSION: No active cardiopulmonary disease.

## 2020-06-05 ENCOUNTER — Other Ambulatory Visit: Payer: Self-pay | Admitting: Neurology

## 2020-06-07 ENCOUNTER — Telehealth: Payer: Self-pay | Admitting: Neurology

## 2020-06-07 NOTE — Telephone Encounter (Signed)
Patient's husband called in needing a refill on the patient's donepezil. It needs to go to [facility] on [address].

## 2020-06-08 ENCOUNTER — Other Ambulatory Visit: Payer: Self-pay

## 2020-06-08 MED ORDER — DONEPEZIL HCL 10 MG PO TABS: ORAL_TABLET | ORAL | 3 refills | Status: DC

## 2020-06-08 NOTE — Telephone Encounter (Signed)
Routed to Dr. Posey Pronto, patient has an appointment in [place],

## 2020-06-19 ENCOUNTER — Inpatient Hospital Stay: Payer: BC Managed Care – PPO | Attending: Internal Medicine

## 2020-06-19 ENCOUNTER — Other Ambulatory Visit: Payer: Self-pay

## 2020-06-19 DIAGNOSIS — C9000 Multiple myeloma not having achieved remission: Secondary | ICD-10-CM | POA: Insufficient documentation

## 2020-06-19 DIAGNOSIS — C9001 Multiple myeloma in remission: Secondary | ICD-10-CM

## 2020-06-19 DIAGNOSIS — Z9221 Personal history of antineoplastic chemotherapy: Secondary | ICD-10-CM | POA: Diagnosis not present

## 2020-06-19 LAB — CBC WITH DIFFERENTIAL (CANCER CENTER ONLY)
Abs Immature Granulocytes: 0.01 10*3/uL (ref 0.00–0.07)
Basophils Absolute: 0 10*3/uL (ref 0.0–0.1)
Basophils Relative: 1 %
Eosinophils Absolute: 0.1 10*3/uL (ref 0.0–0.5)
Eosinophils Relative: 2 %
HCT: 40.1 % (ref 36.0–46.0)
Hemoglobin: 13 g/dL (ref 12.0–15.0)
Immature Granulocytes: 0 %
Lymphocytes Relative: 50 %
Lymphs Abs: 3.1 10*3/uL (ref 0.7–4.0)
MCH: 28 pg (ref 26.0–34.0)
MCHC: 32.4 g/dL (ref 30.0–36.0)
MCV: 86.4 fL (ref 80.0–100.0)
Monocytes Absolute: 0.4 10*3/uL (ref 0.1–1.0)
Monocytes Relative: 7 %
Neutro Abs: 2.4 10*3/uL (ref 1.7–7.7)
Neutrophils Relative %: 40 %
Platelet Count: 176 10*3/uL (ref 150–400)
RBC: 4.64 MIL/uL (ref 3.87–5.11)
RDW: 14.1 % (ref 11.5–15.5)
WBC Count: 6 10*3/uL (ref 4.0–10.5)
nRBC: 0 % (ref 0.0–0.2)

## 2020-06-19 LAB — CMP (CANCER CENTER ONLY)
ALT: 17 U/L (ref 0–44)
AST: 19 U/L (ref 15–41)
Albumin: 3.6 g/dL (ref 3.5–5.0)
Alkaline Phosphatase: 69 U/L (ref 38–126)
Anion gap: 7 (ref 5–15)
BUN: 9 mg/dL (ref 8–23)
CO2: 26 mmol/L (ref 22–32)
Calcium: 9.3 mg/dL (ref 8.9–10.3)
Chloride: 107 mmol/L (ref 98–111)
Creatinine: 1.08 mg/dL — ABNORMAL HIGH (ref 0.44–1.00)
GFR, Est AFR Am: >60 mL/min (ref >60)
GFR, Estimated: 52 mL/min — ABNORMAL LOW (ref >60)
Glucose, Bld: 96 mg/dL (ref 70–99)
Potassium: 3.2 mmol/L — ABNORMAL LOW (ref 3.5–5.1)
Sodium: 140 mmol/L (ref 135–145)
Total Bilirubin: 1 mg/dL (ref 0.3–1.2)
Total Protein: 7.6 g/dL (ref 6.5–8.1)

## 2020-06-19 LAB — LACTATE DEHYDROGENASE: LDH: 145 U/L (ref 98–192)

## 2020-06-20 ENCOUNTER — Telehealth: Payer: Self-pay | Admitting: Physician Assistant

## 2020-06-20 ENCOUNTER — Telehealth: Payer: Self-pay

## 2020-06-20 LAB — IGG, IGA, IGM
IgA: 427 mg/dL — ABNORMAL HIGH (ref 87–352)
IgG (Immunoglobin G), Serum: 1450 mg/dL (ref 586–1602)
IgM (Immunoglobulin M), Srm: 36 mg/dL (ref 26–217)

## 2020-06-20 LAB — BETA 2 MICROGLOBULIN, SERUM: Beta-2 Microglobulin: 1.4 mg/L (ref 0.6–2.4)

## 2020-06-20 LAB — KAPPA/LAMBDA LIGHT CHAINS
Kappa free light chain: 23.1 mg/L — ABNORMAL HIGH (ref 3.3–19.4)
Kappa, lambda light chain ratio: 1.3 (ref 0.26–1.65)
Lambda free light chains: 17.8 mg/L (ref 5.7–26.3)

## 2020-06-20 NOTE — Telephone Encounter (Signed)
-----   Message from Ardeen Garland, RN sent at 06/20/2020 10:18 AM EDT ----- Please call her too ----- Message ----- From: Curt Bears, MD Sent: 06/19/2020   8:53 PM EDT To: Ardeen Garland, RN  Please encourage potassium rich diet. ----- Message ----- From: Buel Ream, Lab In [place] Sent: 06/19/2020  11:06 AM EDT To: Curt Bears, MD

## 2020-06-20 NOTE — Telephone Encounter (Signed)
I tried to call the patient but was unable to reach her. Dr. Julien Nordmann wants her to increase her intake of diet rich foods. I left a message stating I was calling back to discuss her blood work and to call back at her convenience. Pending a call back to have a staff member discuss potassium rich food.

## 2020-06-20 NOTE — Telephone Encounter (Signed)
ERR

## 2020-06-20 NOTE — Telephone Encounter (Signed)
I have left a message for the pt requesting a return call for her results and to review K rich foods.

## 2020-06-21 ENCOUNTER — Telehealth: Payer: Self-pay | Admitting: Medical Oncology

## 2020-06-21 NOTE — Telephone Encounter (Signed)
Husband notified to have Victoria Gates increase potassium in her diet -(Bananas, potatoes , chicken , etc).

## 2020-06-26 ENCOUNTER — Other Ambulatory Visit: Payer: Self-pay

## 2020-06-26 ENCOUNTER — Encounter: Payer: Self-pay | Admitting: Internal Medicine

## 2020-06-26 ENCOUNTER — Inpatient Hospital Stay (HOSPITAL_BASED_OUTPATIENT_CLINIC_OR_DEPARTMENT_OTHER): Payer: BC Managed Care – PPO | Admitting: Internal Medicine

## 2020-06-26 VITALS — BP 161/86 | HR 66 | Temp 97.9°F | Resp 17 | Ht 66.0 in | Wt 189.5 lb

## 2020-06-26 DIAGNOSIS — C9000 Multiple myeloma not having achieved remission: Secondary | ICD-10-CM

## 2020-06-26 DIAGNOSIS — I10 Essential (primary) hypertension: Secondary | ICD-10-CM | POA: Diagnosis not present

## 2020-06-26 NOTE — Progress Notes (Signed)
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  PRIOR THERAPY: 1) Systemic chemotherapy with Carfilzomib, Cytoxan and dexamethasone. First dose on 11/15/2013. Status post 5 cycles. 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 3) Maintenance Revlimid 10 mg by mouth daily. Status post 2 months of treatment. 4) Maintenance Revlimid 15 mg by mouth daily. Status post 4 months of treatment. 5) Maintenance treatment with Revlimid 10 mg by mouth daily status post 17 months. She completed 2 years of treatment in February 2018.  CURRENT THERAPY: Observation.  INTERVAL HISTORY: Victoria Gates 69 y.o. female returns to the clinic today for follow-up visit accompanied by her son Yvone Neu who is a Stage manager in [employer].  The patient is feeling fine today with no concerning complaints except for worsening cognitive changes and dementia as well as an appropriate laugh that has been going on for long time.  The patient denied having any current chest pain, shortness of breath, cough or hemoptysis.  She denied having any fever or chills.  She has no nausea, vomiting, diarrhea or constipation.  She has no headache or visual changes.  She had repeat myeloma panel performed recently and she is here for evaluation and discussion of her lab results.  MEDICAL HISTORY: Past Medical History:  Diagnosis Date  . Allergic rhinitis 01/02/2009  . Bradycardia 04/29/2016  . Cervical Radiculopathy 02/01/2010   Left side  . Chest pain 04/15/2012   Ex MV: Ex time 10 mins, EF 63%, no ischemia/infarct. Occas PAC's/PVC's.  . Diabetes mellitus, type II 03/18/2008  . Encounter for antineoplastic chemotherapy 04/29/2016  . Esophageal Stricture 04/15/2007   s/p dilitation  . GERD  (gastroesophageal reflux disease) 09/21/2010  . Herpes zoster 11/05/2010  . Hiatal Hernia 04/15/2007  . History of colonic polyps 04/24/2007   Hyperplastic only  . Hypercholesterolemia 07/03/2009  . Hypokalemia 11/30/2015  . Iron deficiency anemia 04/24/2007  . Multiple myeloma ([place]) 02/2014  . Osteoporosis 04/24/2007    ALLERGIES:  is allergic to atorvastatin, rosuvastatin, and sulfonamide derivatives.  MEDICATIONS:  Current Outpatient Medications  Medication Sig Dispense Refill  . Ascorbic Acid (VITAMIN C) 1000 MG tablet Take 1,000 mg by mouth daily.     . Calcium 500-125 MG-UNIT TABS Take 1 tablet by mouth daily.      . cholecalciferol (VITAMIN D) 1000 UNITS tablet Take 1,000 Units by mouth daily.    Marland Kitchen donepezil (ARICEPT) 10 MG tablet Take 1 tablet at bedtime 90 tablet 3  . lansoprazole (PREVACID) 30 MG capsule Take 30 mg by mouth daily with breakfast.     . LIVALO 1 MG TABS TAKE 1 TABLET (1 MG TOTAL) BY MOUTH AT BEDTIME. 30 tablet 1  . Potassium Chloride ER 20 MEQ TBCR Take 20 mEq by mouth daily. 7 tablet 0   No current facility-administered medications for this visit.    SURGICAL HISTORY:  Past Surgical History:  Procedure Laterality Date  . ESOPHAGOGASTRODUODENOSCOPY  04/15/2007  . TUBAL LIGATION      REVIEW OF SYSTEMS:  A comprehensive review of systems was negative except for: Behavioral/Psych: positive for behavior problems   PHYSICAL EXAMINATION: General appearance: alert, cooperative and no distress Head: Normocephalic, without obvious abnormality, atraumatic Neck: no adenopathy, no JVD, supple, symmetrical, trachea midline and thyroid not  enlarged, symmetric, no tenderness/mass/nodules Lymph nodes: Cervical, supraclavicular, and axillary nodes normal. Resp: clear to auscultation bilaterally Back: symmetric, no curvature. ROM normal. No CVA tenderness. Cardio: regular rate and rhythm, S1, S2 normal, no murmur, click, rub or gallop GI: soft, non-tender; bowel sounds  normal; no masses,  no organomegaly Extremities: extremities normal, atraumatic, no cyanosis or edema  ECOG PERFORMANCE STATUS: 1 - Symptomatic but completely ambulatory  Blood pressure (!) 161/86, pulse 66, temperature 97.9 F (36.6 C), temperature source Tympanic, resp. rate 17, height $RemoveBe'5\' 6"'[id number]$  (1.676 m), weight 189 lb 8 oz (86 kg), SpO2 100 %.  LABORATORY DATA: Lab Results  Component Value Date   WBC 6.0 06/19/2020   HGB 13.0 06/19/2020   HCT 40.1 06/19/2020   MCV 86.4 06/19/2020   PLT 176 06/19/2020      Chemistry      Component Value Date/Time   NA 140 06/19/2020 1050   NA 139 08/13/2017 0816   K 3.2 (L) 06/19/2020 1050   K 3.2 (L) 08/13/2017 0816   CL 107 06/19/2020 1050   CO2 26 06/19/2020 1050   CO2 26 08/13/2017 0816   BUN 9 06/19/2020 1050   BUN 9.0 08/13/2017 0816   CREATININE 1.08 (H) 06/19/2020 1050   CREATININE 1.0 08/13/2017 0816      Component Value Date/Time   CALCIUM 9.3 06/19/2020 1050   CALCIUM 9.2 08/13/2017 0816   ALKPHOS 69 06/19/2020 1050   ALKPHOS 59 08/13/2017 0816   AST 19 06/19/2020 1050   AST 22 08/13/2017 0816   ALT 17 06/19/2020 1050   ALT 17 08/13/2017 0816   BILITOT 1.0 06/19/2020 1050   BILITOT 0.80 08/13/2017 0816       RADIOGRAPHIC STUDIES: No results found.  ASSESSMENT AND PLAN:  This is a very pleasant 69 years old African-American female with multiple myeloma, IgA subtype status post induction systemic chemotherapy with Carfilzomib, Cytoxan and dexamethasone followed by peripheral blood autologous stem cell transplant followed by 2 years maintenance of treatment with Revlimid. She is current on observation and she is feeling fine with no concerning complaints except for the cognitive changes. Her myeloma panel showed no concerning findings for progression. I recommended for her to continue on observation with repeat myeloma panel in 6 months. For the cognitive changes she is currently followed by neurology. The patient was  advised to call immediately if she has any concerning symptoms in the interval. The patient voices understanding of current disease status and treatment options and is in agreement with the current care plan. All questions were answered. The patient knows to call the clinic with any problems, questions or concerns. We can certainly see the patient much sooner if necessary.  Disclaimer: This note was dictated with voice recognition software. Similar sounding words can inadvertently be transcribed and may not be corrected upon review.

## 2020-06-30 ENCOUNTER — Telehealth: Payer: Self-pay | Admitting: Internal Medicine

## 2020-06-30 NOTE — Telephone Encounter (Signed)
Scheduled per los. Called, not able to leave msg. Mailed printout  

## 2020-07-03 ENCOUNTER — Telehealth: Payer: Self-pay

## 2020-07-03 ENCOUNTER — Other Ambulatory Visit: Payer: Self-pay

## 2020-07-03 ENCOUNTER — Ambulatory Visit (INDEPENDENT_AMBULATORY_CARE_PROVIDER_SITE_OTHER): Payer: BC Managed Care – PPO | Admitting: Family Medicine

## 2020-07-03 ENCOUNTER — Encounter: Payer: Self-pay | Admitting: Family Medicine

## 2020-07-03 VITALS — BP 110/80 | HR 78 | Temp 98.7°F | Resp 18 | Ht 66.0 in | Wt 190.6 lb

## 2020-07-03 DIAGNOSIS — E785 Hyperlipidemia, unspecified: Secondary | ICD-10-CM | POA: Insufficient documentation

## 2020-07-03 DIAGNOSIS — Z23 Encounter for immunization: Secondary | ICD-10-CM

## 2020-07-03 DIAGNOSIS — I10 Essential (primary) hypertension: Secondary | ICD-10-CM

## 2020-07-03 DIAGNOSIS — G3184 Mild cognitive impairment, so stated: Secondary | ICD-10-CM | POA: Diagnosis not present

## 2020-07-03 MED ORDER — LIVALO 1 MG PO TABS
1.0000 mg | ORAL_TABLET | Freq: Every day | ORAL | 1 refills | Status: DC
Start: 2020-07-03 — End: 2021-02-15

## 2020-07-03 NOTE — Assessment & Plan Note (Signed)
Encouraged heart healthy diet, increase exercise, avoid trans fats, consider a krill oil cap daily 

## 2020-07-03 NOTE — Telephone Encounter (Signed)
PA initiated via Covermymeds; KEY: [id number]. PA approved.   [id number];OQUCLT:[id number];Review Type:Prior Auth;Coverage Start Date:06/03/2020;Coverage End Date:07/03/2021;

## 2020-07-03 NOTE — Assessment & Plan Note (Signed)
Well controlled, no changes to meds. Encouraged heart healthy diet such as the DASH diet and exercise as tolerated.  °

## 2020-07-03 NOTE — Patient Instructions (Signed)
DASH Eating Plan DASH stands for "Dietary Approaches to Stop Hypertension." The DASH eating plan is a healthy eating plan that has been shown to reduce high blood pressure (hypertension). It may also reduce your risk for type 2 diabetes, heart disease, and stroke. The DASH eating plan may also help with weight loss. What are tips for following this plan?  General guidelines  Avoid eating more than 2,300 mg (milligrams) of salt (sodium) a day. If you have hypertension, you may need to reduce your sodium intake to 1,500 mg a day.  Limit alcohol intake to no more than 1 drink a day for nonpregnant women and 2 drinks a day for men. One drink equals 12 oz of beer, 5 oz of wine, or 1 oz of hard liquor.  Work with your health care provider to maintain a healthy body weight or to lose weight. Ask what an ideal weight is for you.  Get at least 30 minutes of exercise that causes your heart to beat faster (aerobic exercise) most days of the week. Activities may include walking, swimming, or biking.  Work with your health care provider or diet and nutrition specialist (dietitian) to adjust your eating plan to your individual calorie needs. Reading food labels   Check food labels for the amount of sodium per serving. Choose foods with less than 5 percent of the Daily Value of sodium. Generally, foods with less than 300 mg of sodium per serving fit into this eating plan.  To find whole grains, look for the word "whole" as the first word in the ingredient list. Shopping  Buy products labeled as "low-sodium" or "no salt added."  Buy fresh foods. Avoid canned foods and premade or frozen meals. Cooking  Avoid adding salt when cooking. Use salt-free seasonings or herbs instead of table salt or sea salt. Check with your health care provider or pharmacist before using salt substitutes.  Do not fry foods. Cook foods using healthy methods such as baking, boiling, grilling, and broiling instead.  Cook with  heart-healthy oils, such as olive, canola, soybean, or sunflower oil. Meal planning  Eat a balanced diet that includes: ? 5 or more servings of fruits and vegetables each day. At each meal, try to fill half of your plate with fruits and vegetables. ? Up to 6-8 servings of whole grains each day. ? Less than 6 oz of lean meat, poultry, or fish each day. A 3-oz serving of meat is about the same size as a deck of cards. One egg equals 1 oz. ? 2 servings of low-fat dairy each day. ? A serving of nuts, seeds, or beans 5 times each week. ? Heart-healthy fats. Healthy fats called Omega-3 fatty acids are found in foods such as flaxseeds and coldwater fish, like sardines, salmon, and mackerel.  Limit how much you eat of the following: ? Canned or prepackaged foods. ? Food that is high in trans fat, such as fried foods. ? Food that is high in saturated fat, such as fatty meat. ? Sweets, desserts, sugary drinks, and other foods with added sugar. ? Full-fat dairy products.  Do not salt foods before eating.  Try to eat at least 2 vegetarian meals each week.  Eat more home-cooked food and less restaurant, buffet, and fast food.  When eating at a restaurant, ask that your food be prepared with less salt or no salt, if possible. What foods are recommended? The items listed may not be a complete list. Talk with your dietitian about   what dietary choices are best for you. Grains Whole-grain or whole-wheat bread. Whole-grain or whole-wheat pasta. Brown rice. Oatmeal. Quinoa. Bulgur. Whole-grain and low-sodium cereals. Pita bread. Low-fat, low-sodium crackers. Whole-wheat flour tortillas. Vegetables Fresh or frozen vegetables (raw, steamed, roasted, or grilled). Low-sodium or reduced-sodium tomato and vegetable juice. Low-sodium or reduced-sodium tomato sauce and tomato paste. Low-sodium or reduced-sodium canned vegetables. Fruits All fresh, dried, or frozen fruit. Canned fruit in natural juice (without  added sugar). Meat and other protein foods Skinless chicken or turkey. Ground chicken or turkey. Pork with fat trimmed off. Fish and seafood. Egg whites. Dried beans, peas, or lentils. Unsalted nuts, nut butters, and seeds. Unsalted canned beans. Lean cuts of beef with fat trimmed off. Low-sodium, lean deli meat. Dairy Low-fat (1%) or fat-free (skim) milk. Fat-free, low-fat, or reduced-fat cheeses. Nonfat, low-sodium ricotta or cottage cheese. Low-fat or nonfat yogurt. Low-fat, low-sodium cheese. Fats and oils Soft margarine without trans fats. Vegetable oil. Low-fat, reduced-fat, or light mayonnaise and salad dressings (reduced-sodium). Canola, safflower, olive, soybean, and sunflower oils. Avocado. Seasoning and other foods Herbs. Spices. Seasoning mixes without salt. Unsalted popcorn and pretzels. Fat-free sweets. What foods are not recommended? The items listed may not be a complete list. Talk with your dietitian about what dietary choices are best for you. Grains Baked goods made with fat, such as croissants, muffins, or some breads. Dry pasta or rice meal packs. Vegetables Creamed or fried vegetables. Vegetables in a cheese sauce. Regular canned vegetables (not low-sodium or reduced-sodium). Regular canned tomato sauce and paste (not low-sodium or reduced-sodium). Regular tomato and vegetable juice (not low-sodium or reduced-sodium). Pickles. Olives. Fruits Canned fruit in a light or heavy syrup. Fried fruit. Fruit in cream or butter sauce. Meat and other protein foods Fatty cuts of meat. Ribs. Fried meat. Bacon. Sausage. Bologna and other processed lunch meats. Salami. Fatback. Hotdogs. Bratwurst. Salted nuts and seeds. Canned beans with added salt. Canned or smoked fish. Whole eggs or egg yolks. Chicken or turkey with skin. Dairy Whole or 2% milk, cream, and half-and-half. Whole or full-fat cream cheese. Whole-fat or sweetened yogurt. Full-fat cheese. Nondairy creamers. Whipped toppings.  Processed cheese and cheese spreads. Fats and oils Butter. Stick margarine. Lard. Shortening. Ghee. Bacon fat. Tropical oils, such as coconut, palm kernel, or palm oil. Seasoning and other foods Salted popcorn and pretzels. Onion salt, garlic salt, seasoned salt, table salt, and sea salt. Worcestershire sauce. Tartar sauce. Barbecue sauce. Teriyaki sauce. Soy sauce, including reduced-sodium. Steak sauce. Canned and packaged gravies. Fish sauce. Oyster sauce. Cocktail sauce. Horseradish that you find on the shelf. Ketchup. Mustard. Meat flavorings and tenderizers. Bouillon cubes. Hot sauce and Tabasco sauce. Premade or packaged marinades. Premade or packaged taco seasonings. Relishes. Regular salad dressings. Where to find more information:  National Heart, Lung, and Blood Institute: www.nhlbi.nih.gov  American Heart Association: www.heart.org Summary  The DASH eating plan is a healthy eating plan that has been shown to reduce high blood pressure (hypertension). It may also reduce your risk for type 2 diabetes, heart disease, and stroke.  With the DASH eating plan, you should limit salt (sodium) intake to 2,300 mg a day. If you have hypertension, you may need to reduce your sodium intake to 1,500 mg a day.  When on the DASH eating plan, aim to eat more fresh fruits and vegetables, whole grains, lean proteins, low-fat dairy, and heart-healthy fats.  Work with your health care provider or diet and nutrition specialist (dietitian) to adjust your eating plan to your   individual calorie needs. This information is not intended to replace advice given to you by your health care provider. Make sure you discuss any questions you have with your health care provider. Document Revised: 08/29/2017 Document Reviewed: 09/09/2016 Elsevier Patient Education  2020 [employer].  

## 2020-07-03 NOTE — Progress Notes (Signed)
Patient ID: Victoria Gates, female    DOB: 03-31-1951  Age: 69 y.o. MRN: [id number]   Subjective:  Subjective  HPI Victoria Gates presents for f/u bp and chol.   No complaints  She sees endo for dm.  Her husband is with her today   Review of Systems  Constitutional: Negative for appetite change, diaphoresis, fatigue and unexpected weight change.  Eyes: Negative for pain, redness and visual disturbance.  Respiratory: Negative for cough, chest tightness, shortness of breath and wheezing.   Cardiovascular: Negative for chest pain, palpitations and leg swelling.  Endocrine: Negative for cold intolerance, heat intolerance, polydipsia, polyphagia and polyuria.  Genitourinary: Negative for difficulty urinating, dysuria and frequency.  Neurological: Negative for dizziness, light-headedness, numbness and headaches.    History Past Medical History:  Diagnosis Date  . Allergic rhinitis 01/02/2009  . Bradycardia 04/29/2016  . Cervical Radiculopathy 02/01/2010   Left side  . Chest pain 04/15/2012   Ex MV: Ex time 10 mins, EF 63%, no ischemia/infarct. Occas PAC's/PVC's.  . Diabetes mellitus, type II 03/18/2008  . Encounter for antineoplastic chemotherapy 04/29/2016  . Esophageal Stricture 04/15/2007   s/p dilitation  . GERD (gastroesophageal reflux disease) 09/21/2010  . Herpes zoster 11/05/2010  . Hiatal Hernia 04/15/2007  . History of colonic polyps 04/24/2007   Hyperplastic only  . Hypercholesterolemia 07/03/2009  . Hypokalemia 11/30/2015  . Iron deficiency anemia 04/24/2007  . Multiple myeloma ([facility]) 02/2014  . Osteoporosis 04/24/2007    She has a past surgical history that includes Tubal ligation and Esophagogastroduodenoscopy (04/15/2007).   Her family history includes Brain cancer in her mother; Diabetes in her brother and sister; Heart disease in her brother, father, mother, and sister; Lung cancer in her father.She reports that she has never smoked. She has never used smokeless  tobacco. She reports that she does not drink alcohol and does not use drugs.  Current Outpatient Medications on File Prior to Visit  Medication Sig Dispense Refill  . Ascorbic Acid (VITAMIN C) 1000 MG tablet Take 1,000 mg by mouth daily.     . Calcium 500-125 MG-UNIT TABS Take 1 tablet by mouth daily.      . cholecalciferol (VITAMIN D) 1000 UNITS tablet Take 1,000 Units by mouth daily.    Marland Kitchen donepezil (ARICEPT) 10 MG tablet Take 1 tablet at bedtime 90 tablet 3  . lansoprazole (PREVACID) 30 MG capsule Take 30 mg by mouth daily with breakfast.     . Potassium Chloride ER 20 MEQ TBCR Take 20 mEq by mouth daily. 7 tablet 0   No current facility-administered medications on file prior to visit.     Objective:  Objective  Physical Exam Vitals and nursing note reviewed.  Constitutional:      Appearance: She is well-developed.  HENT:     Head: Normocephalic and atraumatic.  Eyes:     Conjunctiva/sclera: Conjunctivae normal.  Neck:     Thyroid: No thyromegaly.     Vascular: No carotid bruit or JVD.  Cardiovascular:     Rate and Rhythm: Normal rate and regular rhythm.     Heart sounds: Normal heart sounds. No murmur heard.   Pulmonary:     Effort: Pulmonary effort is normal. No respiratory distress.     Breath sounds: Normal breath sounds. No wheezing or rales.  Chest:     Chest wall: No tenderness.  Musculoskeletal:     Cervical back: Normal range of motion and neck supple.  Neurological:     Mental Status:  She is alert and oriented to person, place, and time.    BP 110/80 (BP Location: Right Arm, Patient Position: Sitting, Cuff Size: Large)   Pulse 78   Temp 98.7 F (37.1 C) (Oral)   Resp 18   Ht $R'5\' 6"'ac$  (1.676 m)   Wt 190 lb 9.6 oz (86.5 kg)   SpO2 98%   BMI 30.76 kg/m  Wt Readings from Last 3 Encounters:  07/03/20 190 lb 9.6 oz (86.5 kg)  06/26/20 189 lb 8 oz (86 kg)  03/02/20 185 lb 12.8 oz (84.3 kg)     Lab Results  Component Value Date   WBC 6.0 06/19/2020   HGB  13.0 06/19/2020   HCT 40.1 06/19/2020   PLT 176 06/19/2020   GLUCOSE 96 06/19/2020   CHOL 138 01/10/2020   TRIG 151.0 (H) 01/10/2020   HDL 30.70 (L) 01/10/2020   LDLDIRECT 144.0 12/08/2018   LDLCALC 77 01/10/2020   ALT 17 06/19/2020   AST 19 06/19/2020   NA 140 06/19/2020   K 3.2 (L) 06/19/2020   CL 107 06/19/2020   CREATININE 1.08 (H) 06/19/2020   BUN 9 06/19/2020   CO2 26 06/19/2020   TSH 4.49 12/08/2018   INR 1.22 05/09/2016   HGBA1C 5.5 03/02/2020   MICROALBUR <0.7 09/01/2017    MR BRAIN W WO CONTRAST  Result Date: 03/08/2019 CLINICAL DATA:  Memory loss.  Cognitive and behavioral changes. EXAM: MRI HEAD WITHOUT AND WITH CONTRAST TECHNIQUE: Multiplanar, multiecho pulse sequences of the brain and surrounding structures were obtained without and with intravenous contrast. CONTRAST:  30mL MULTIHANCE GADOBENATE DIMEGLUMINE 529 MG/ML IV SOLN COMPARISON:  Head CTA 07/19/2016 FINDINGS: Brain: There is no evidence of acute infarct, intracranial hemorrhage, mass, midline shift, or extra-axial fluid collection. Cerebral atrophy is noted including prominent right greater than left temporal lobe volume loss and enlargement of sylvian fissures. No significant cerebral white matter disease is seen for age. No abnormal enhancement is                                  identified. Vascular: Major intracranial vascular flow voids are preserved. Skull and upper cervical spine: Unremarkable bone marrow signal. Sinuses/Orbits: Unchanged left sphenoid sinus mucous retention cyst. Clear mastoid air cells. Unremarkable orbits. Other: None. IMPRESSION: 1. No acute intracranial abnormality. 2. Cerebral atrophy primarily affecting the temporal lobes. Electronically Signed   By: Logan Bores M.D.   On: 03/08/2019 15:02     Assessment & Plan:  Plan  I have changed Victoria Gates's Livalo. I am also having her maintain her Calcium, cholecalciferol, vitamin C, lansoprazole,  Potassium Chloride ER, and donepezil.  Meds ordered this encounter  Medications  . Pitavastatin Calcium (LIVALO) 1 MG TABS    Sig: Take 1 tablet (1 mg total) by mouth at bedtime.    Dispense:  90 tablet    Refill:  1    Problem List Items Addressed This Visit      Unprioritized   Hyperlipidemia   Relevant Medications   Pitavastatin Calcium (LIVALO) 1 MG TABS   Other Relevant Orders   Lipid panel   Comprehensive metabolic panel    Other Visit Diagnoses    Need for influenza vaccination    -  Primary   Relevant Orders   Flu Vaccine QUAD High Dose(Fluad) (Completed)      Follow-up: Return in about 6 months (around 01/01/2021), or if symptoms worsen or  fail to improve, for annual exam, fasting.  Ann Held, DO

## 2020-07-03 NOTE — Assessment & Plan Note (Signed)
Per neuro 

## 2020-07-04 LAB — COMPREHENSIVE METABOLIC PANEL
AG Ratio: 1.2 (calc) (ref 1.0–2.5)
ALT: 15 U/L (ref 6–29)
AST: 20 U/L (ref 10–35)
Albumin: 3.7 g/dL (ref 3.6–5.1)
Alkaline phosphatase (APISO): 55 U/L (ref 37–153)
BUN/Creatinine Ratio: 9 (calc) (ref 6–22)
BUN: 9 mg/dL (ref 7–25)
CO2: 26 mmol/L (ref 20–32)
Calcium: 9.1 mg/dL (ref 8.6–10.4)
Chloride: 107 mmol/L (ref 98–110)
Creat: 1.04 mg/dL — ABNORMAL HIGH (ref 0.50–0.99)
Globulin: 3 g/dL (calc) (ref 1.9–3.7)
Glucose, Bld: 97 mg/dL (ref 65–99)
Potassium: 3.2 mmol/L — ABNORMAL LOW (ref 3.5–5.3)
Sodium: 142 mmol/L (ref 135–146)
Total Bilirubin: 0.6 mg/dL (ref 0.2–1.2)
Total Protein: 6.7 g/dL (ref 6.1–8.1)

## 2020-07-04 LAB — LIPID PANEL
Cholesterol: 192 mg/dL (ref <200)
HDL: 35 mg/dL — ABNORMAL LOW (ref >=50)
LDL Cholesterol (Calc): 115 mg/dL (calc) — ABNORMAL HIGH
Non-HDL Cholesterol (Calc): 157 mg/dL (calc) — ABNORMAL HIGH (ref <130)
Total CHOL/HDL Ratio: 5.5 (calc) — ABNORMAL HIGH (ref <5.0)
Triglycerides: 291 mg/dL — ABNORMAL HIGH (ref <150)

## 2020-07-06 ENCOUNTER — Other Ambulatory Visit: Payer: Self-pay | Admitting: Family Medicine

## 2020-07-06 DIAGNOSIS — E785 Hyperlipidemia, unspecified: Secondary | ICD-10-CM

## 2020-07-10 ENCOUNTER — Other Ambulatory Visit: Payer: Self-pay

## 2020-07-10 ENCOUNTER — Ambulatory Visit: Payer: BC Managed Care – PPO | Admitting: Family Medicine

## 2020-07-11 MED ORDER — LIVALO 2 MG PO TABS: 2.0000 mg | ORAL_TABLET | Freq: Every day | ORAL | 2 refills | Status: DC

## 2020-07-11 NOTE — Addendum Note (Signed)
Addended by: Wynonia Musty A on: 07/11/2020 11:14 AM   Modules accepted: Orders

## 2020-08-11 ENCOUNTER — Other Ambulatory Visit: Payer: Self-pay

## 2020-08-11 ENCOUNTER — Encounter: Payer: Self-pay | Admitting: Neurology

## 2020-08-11 ENCOUNTER — Ambulatory Visit (INDEPENDENT_AMBULATORY_CARE_PROVIDER_SITE_OTHER): Payer: BC Managed Care – PPO | Admitting: Neurology

## 2020-08-11 VITALS — BP 146/89 | HR 91 | Ht 66.0 in | Wt 196.0 lb

## 2020-08-11 DIAGNOSIS — G3109 Other frontotemporal dementia: Secondary | ICD-10-CM | POA: Diagnosis not present

## 2020-08-11 DIAGNOSIS — F0280 Dementia in other diseases classified elsewhere without behavioral disturbance: Secondary | ICD-10-CM | POA: Diagnosis not present

## 2020-08-11 MED ORDER — SERTRALINE HCL 25 MG PO TABS: 25.0000 mg | ORAL_TABLET | Freq: Every day | ORAL | 3 refills | Status: DC

## 2020-08-11 MED ORDER — RIVASTIGMINE TARTRATE 1.5 MG PO CAPS: 1.5000 mg | ORAL_CAPSULE | Freq: Two times a day (BID) | ORAL | 3 refills | Status: DC
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
## 2020-08-11 NOTE — Patient Instructions (Addendum)
Stop donepezil Start rivastigmine 1.5mg  twice daily After two weeks of being on the rivastigmine, start zoloft 25mg  daily  Return follow-up in 3 months as virtual visit

## 2020-08-16 IMAGING — MG DIGITAL DIAGNOSTIC UNILATERAL RIGHT MAMMOGRAM WITH TOMO AND CAD
4 series · 4 of 12 positions shown · non-contrast
Comparison: Previous exam(s).

CLINICAL DATA: Screening recall for a possible asymmetry in the
right breast.

EXAM:
DIGITAL DIAGNOSTIC UNILATERAL RIGHT MAMMOGRAM WITH CAD AND TOMO

[R CC synth-2D]
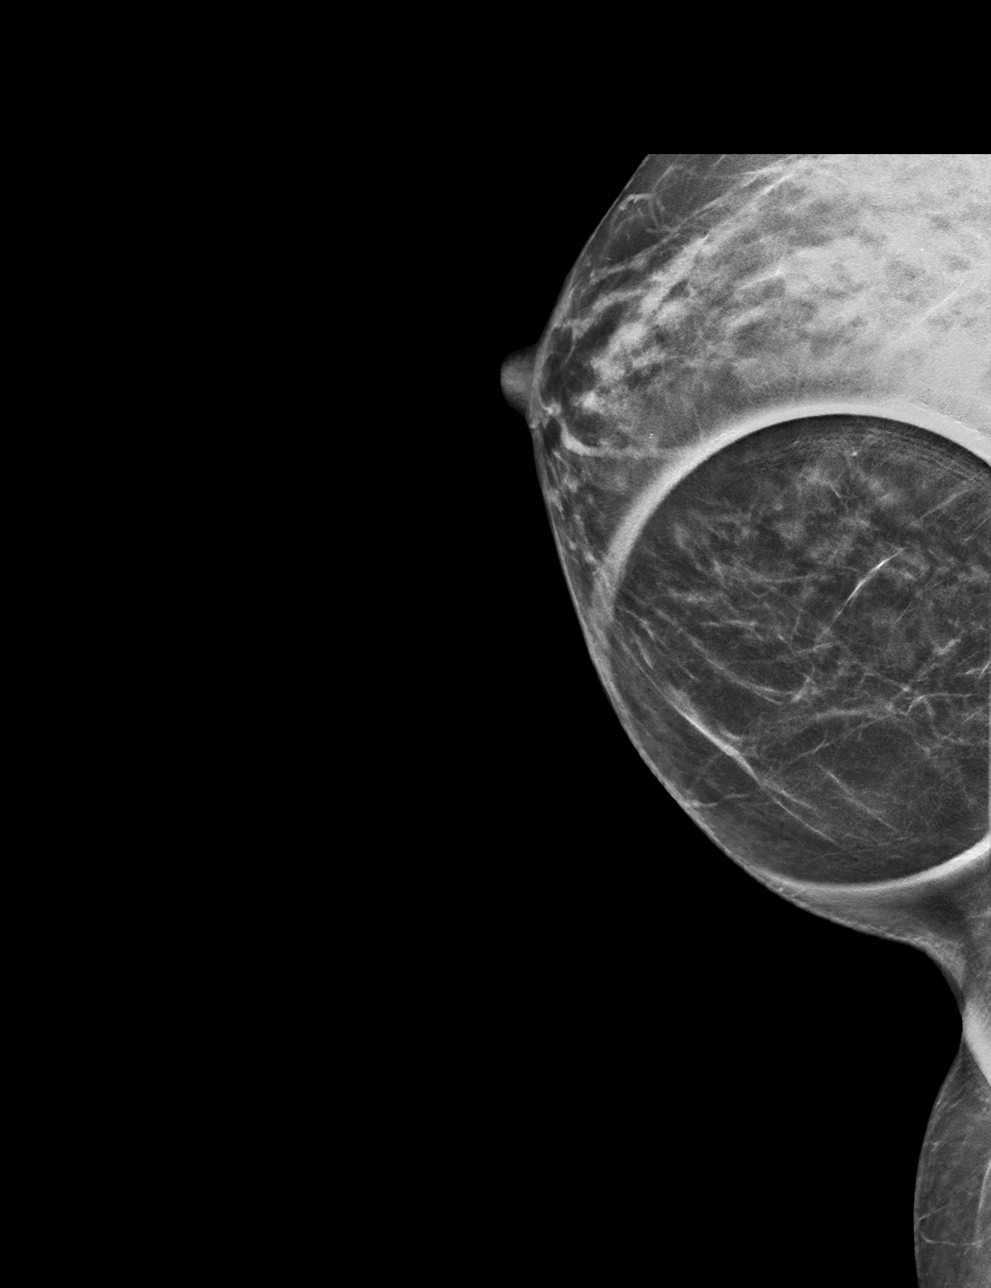

[R ML synth-2D]
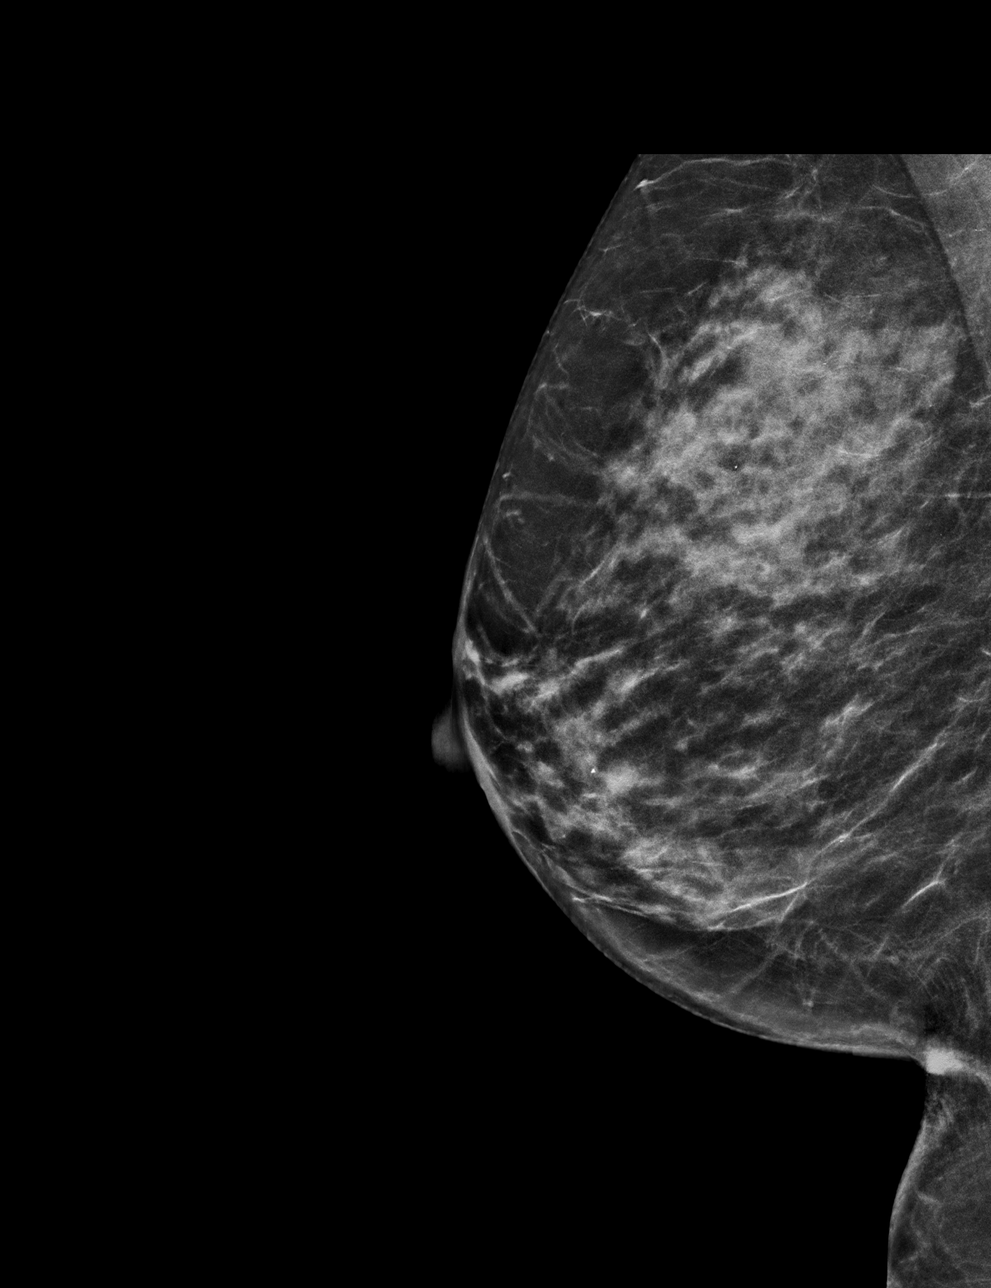

[R ML tomo · tomo slice 31/62.0]
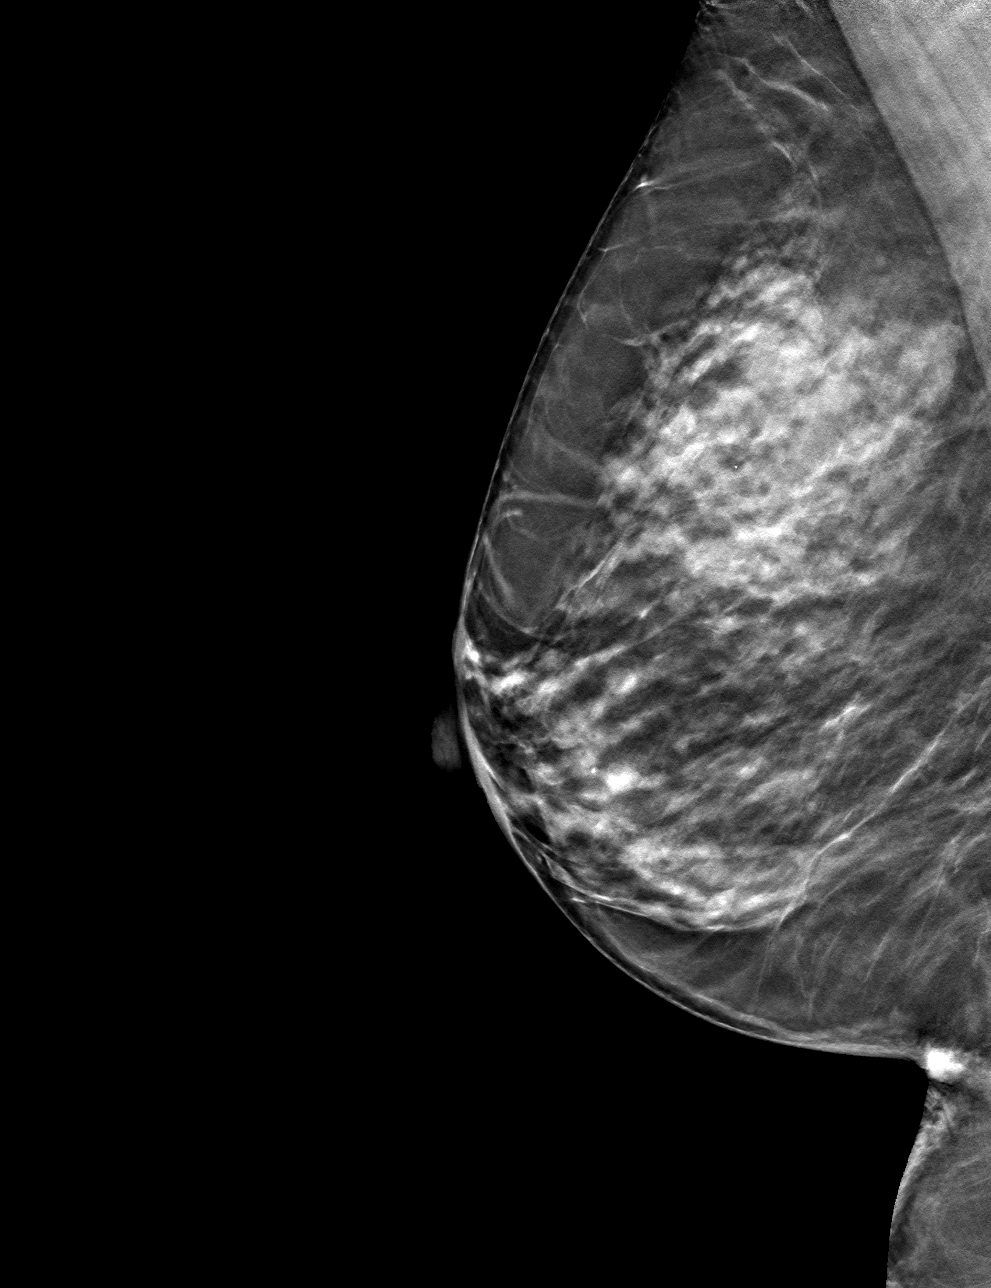

[R CC tomo · tomo slice 27/53.0]
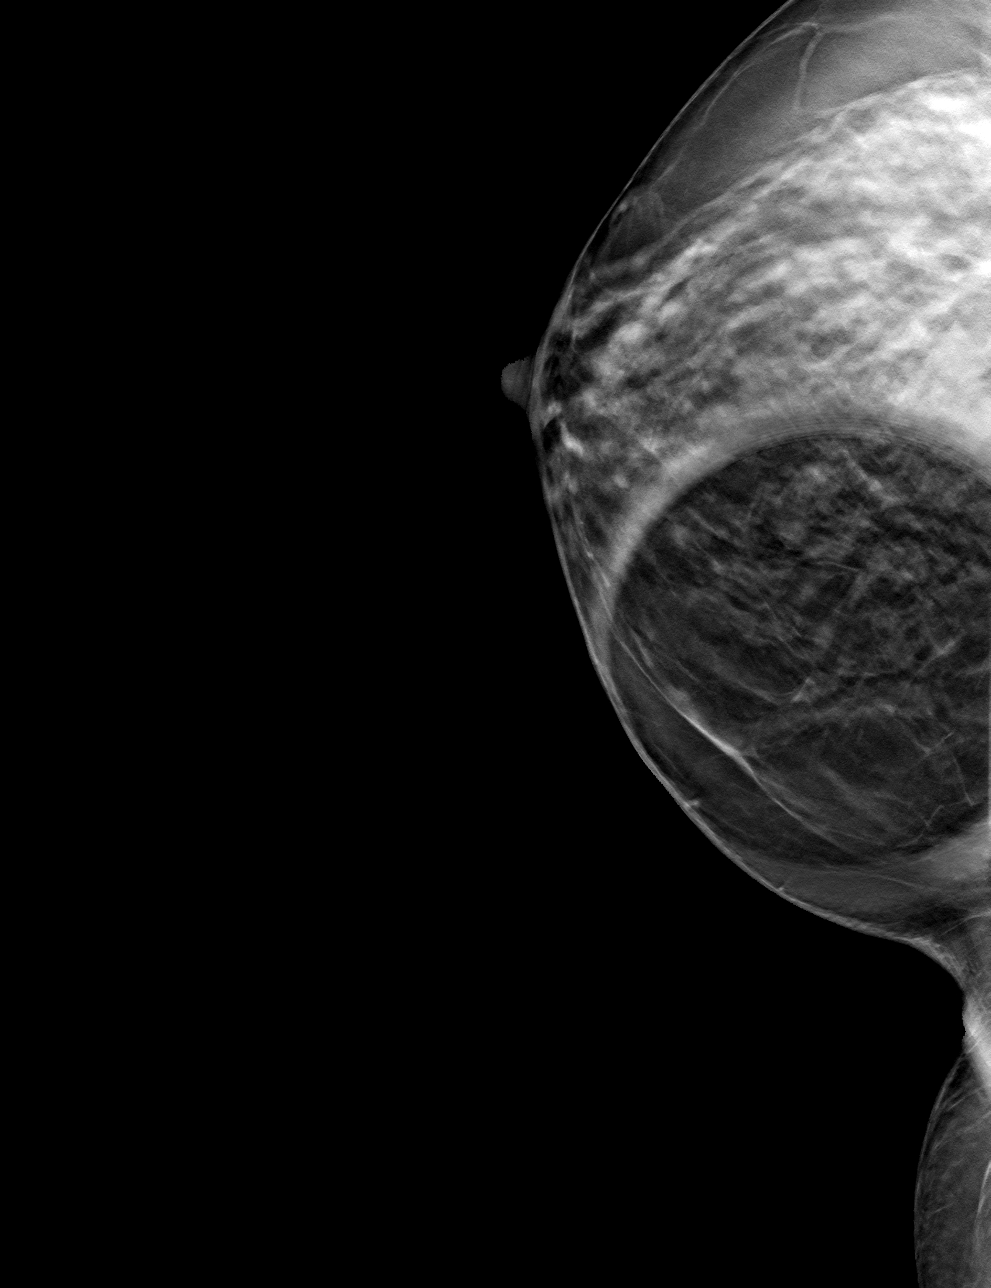

[4 of 12 positions shown; findings below may reference images not displayed]

ACR Breast Density Category c: The breast tissue is heterogeneously
dense, which may obscure small masses.
FINDINGS: On the diagnostic spot-compression images and true mL image, the
possible asymmetry disperses consistent with superimposed normal
fibroglandular tissue. There is no underlying mass or significant
residual asymmetry. There is no architectural distortion and no
suspicious calcifications.

Mammographic images were processed with CAD.
IMPRESSION: No evidence of breast malignancy.

RECOMMENDATION:
Screening mammogram in one year.(Code:[id number]-A-4JZ)

I have discussed the findings and recommendations with the patient.
Results were also provided in writing at the conclusion of the
visit. If applicable, a reminder letter will be sent to the patient
regarding the next appointment.

BI-RADS CATEGORY  1: Negative.

## 2020-08-31 ENCOUNTER — Telehealth: Payer: Self-pay

## 2020-08-31 NOTE — Telephone Encounter (Signed)
Received

## 2020-08-31 NOTE — Telephone Encounter (Signed)
Patient's Spouse, Chrissie Noa, mailed to the office forms from the [facility] [employer] of [employer] for completion. Forms put in Dr. Nonda Lou box for pick up and completion.

## 2020-09-03 ENCOUNTER — Other Ambulatory Visit: Payer: Self-pay | Admitting: Neurology

## 2020-09-06 ENCOUNTER — Other Ambulatory Visit: Payer: Self-pay

## 2020-09-06 ENCOUNTER — Ambulatory Visit (INDEPENDENT_AMBULATORY_CARE_PROVIDER_SITE_OTHER): Payer: BC Managed Care – PPO | Admitting: Endocrinology

## 2020-09-06 ENCOUNTER — Encounter: Payer: Self-pay | Admitting: Endocrinology

## 2020-09-06 VITALS — BP 128/80 | HR 82 | Ht 68.0 in | Wt 196.0 lb

## 2020-09-06 DIAGNOSIS — E119 Type 2 diabetes mellitus without complications: Secondary | ICD-10-CM

## 2020-09-06 LAB — POCT GLYCOSYLATED HEMOGLOBIN (HGB A1C): Hemoglobin A1C: 5.6 % (ref 4.0–5.6)

## 2020-09-06 NOTE — Patient Instructions (Addendum)
good diet and exercise significantly improve the control of your diabetes.  You don't need any medication for the blood sugar now.   Please come back for a follow-up appointment in 1 year.

## 2020-09-06 NOTE — Progress Notes (Signed)
Subjective:    Patient ID: Victoria Gates, female    DOB: 05-11-1951, 69 y.o.   MRN: [id number]  HPI Pt returns for f/u of diabetes mellitus:  DM type: 2 Dx'ed: 5188 Complications: stage 2 CRI.     Therapy: no medication now.   GDM: never DKA: never Severe hypoglycemia: never Pancreatitis: never Other: she has never been on insulin.   Interval history: pt states she feels well in general.  She has gained a few lbs.   Past Medical History:  Diagnosis Date  . Allergic rhinitis 01/02/2009  . Bradycardia 04/29/2016  . Cervical Radiculopathy 02/01/2010   Left side  . Chest pain 04/15/2012   Ex MV: Ex time 10 mins, EF 63%, no ischemia/infarct. Occas PAC's/PVC's.  . Diabetes mellitus, type II 03/18/2008  . Encounter for antineoplastic chemotherapy 04/29/2016  . Esophageal Stricture 04/15/2007   s/p dilitation  . GERD (gastroesophageal reflux disease) 09/21/2010  . Herpes zoster 11/05/2010  . Hiatal Hernia 04/15/2007  . History of colonic polyps 04/24/2007   Hyperplastic only  . Hypercholesterolemia 07/03/2009  . Hypokalemia 11/30/2015  . Iron deficiency anemia 04/24/2007  . Multiple myeloma ([facility]) 02/2014  . Osteoporosis 04/24/2007    Past Surgical History:  Procedure Laterality Date  . ESOPHAGOGASTRODUODENOSCOPY  04/15/2007  . TUBAL LIGATION      Social History   Socioeconomic History  . Marital status: Married    Spouse name: Not on file  . Number of children: Not on file  . Years of education: Not on file  . Highest education level: Not on file  Occupational History  . Occupation: MAIL Armed forces operational officer: [employer]  Tobacco Use  . Smoking status: Never Smoker  . Smokeless tobacco: Never Used  Vaping Use  . Vaping Use: Never used  Substance and Sexual Activity  . Alcohol use: No  . Drug use: No  . Sexual activity: Yes  Other Topics Concern  . Not on file  Social History Narrative   Lives in [place] with spouse.   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   Right Handed   Social Determinants of Health   Financial Resource Strain: Not on file  Food Insecurity: Not on file  Transportation Needs: Not on file  Physical Activity: Not on file  Stress: Not on file  Social Connections: Not on file  Intimate Partner Violence: Not on file    Current Outpatient Medications on File Prior to Visit  Medication Sig Dispense Refill  . Ascorbic Acid (VITAMIN C) 1000 MG tablet Take 1,000 mg by mouth daily.     . Calcium 500-125 MG-UNIT TABS Take 1 tablet by mouth daily.      . cholecalciferol (VITAMIN D) 1000 UNITS tablet Take 1,000 Units by mouth daily.    . lansoprazole (PREVACID) 30 MG capsule Take 30 mg by mouth daily with breakfast.     . Pitavastatin Calcium (LIVALO) 1 MG TABS Take 1 tablet (1 mg total) by mouth at bedtime. 90 tablet 1  . Pitavastatin Calcium (LIVALO) 2 MG TABS Take 1 tablet (2 mg total) by mouth daily. 30 tablet 2  . Potassium Chloride ER 20 MEQ TBCR Take 20 mEq by mouth daily. 7 tablet 0  . rivastigmine (EXELON) 1.5 MG capsule Take 1 capsule (1.5 mg total) by mouth 2 (two) times daily. 60 capsule 3  . sertraline (ZOLOFT) 25 MG tablet TAKE 1 TABLET BY MOUTH EVERY DAY 90 tablet 0   No  current facility-administered medications on file prior to visit.    Allergies  Allergen Reactions  . Atorvastatin     REACTION: myalgias  . Rosuvastatin     REACTION: myalgias  . Sulfonamide Derivatives     Unknown reaction     Family History  Problem Relation Age of Onset  . Brain cancer Mother   . Heart disease Mother   . Lung cancer Father   . Heart disease Father   . Diabetes Sister   . Heart disease Sister   . Diabetes Brother   . Heart disease Brother   . Cancer Neg Hx        No FH of Colon Cancer  . Colon cancer Neg Hx   . Esophageal cancer Neg Hx   . Rectal cancer Neg Hx   . Stomach cancer Neg Hx     BP 128/80   Pulse 82   Ht $R'5\' 8"'fq$  (1.727 m)   Wt 196 lb (88.9 kg)   SpO2 98%   BMI 29.80 kg/m     Review of Systems     Objective:   Physical Exam VITAL SIGNS:  See vs page GENERAL: no distress Pulses: dorsalis pedis intact bilat.   MSK: no deformity of the feet CV: trace bilat leg edema Skin:  no ulcer on the feet.  normal color and temp on the feet.   Neuro: sensation is intact to touch on the feet.    A1c=5.6%      Assessment & Plan:  Type 2 DM: stable off medication Obesity: persistent.  Patient Instructions  good diet and exercise significantly improve the control of your diabetes.  You don't need any medication for the blood sugar now.   Please come back for a follow-up appointment in 1 year.

## 2020-09-11 NOTE — Telephone Encounter (Signed)
Husband called. LVM and advise to return call to let us know if forms were being picked up or mailed.

## 2020-09-12 ENCOUNTER — Telehealth: Payer: Self-pay

## 2020-09-12 NOTE — Telephone Encounter (Signed)
Forms mailed 

## 2020-09-12 NOTE — Telephone Encounter (Signed)
Caller said that the paperwork regarding patient Victoria Gates can be mailed. Office information provided. Telephone: [phone]

## 2020-10-09 ENCOUNTER — Other Ambulatory Visit: Payer: BC Managed Care – PPO

## 2020-10-11 ENCOUNTER — Other Ambulatory Visit: Payer: Self-pay

## 2020-10-11 ENCOUNTER — Other Ambulatory Visit (INDEPENDENT_AMBULATORY_CARE_PROVIDER_SITE_OTHER): Payer: BC Managed Care – PPO

## 2020-10-11 DIAGNOSIS — E785 Hyperlipidemia, unspecified: Secondary | ICD-10-CM

## 2020-10-11 LAB — COMPREHENSIVE METABOLIC PANEL
ALT: 16 U/L (ref 0–35)
AST: 18 U/L (ref 0–37)
Albumin: 3.8 g/dL (ref 3.5–5.2)
Alkaline Phosphatase: 58 U/L (ref 39–117)
BUN: 12 mg/dL (ref 6–23)
CO2: 27 mEq/L (ref 19–32)
Calcium: 9.3 mg/dL (ref 8.4–10.5)
Chloride: 106 mEq/L (ref 96–112)
Creatinine, Ser: 0.97 mg/dL (ref 0.40–1.20)
GFR: 59.43 mL/min — ABNORMAL LOW (ref >60.00)
Glucose, Bld: 98 mg/dL (ref 70–99)
Potassium: 3.2 mEq/L — ABNORMAL LOW (ref 3.5–5.1)
Sodium: 138 mEq/L (ref 135–145)
Total Bilirubin: 1 mg/dL (ref 0.2–1.2)
Total Protein: 6.8 g/dL (ref 6.0–8.3)

## 2020-10-11 LAB — LIPID PANEL
Cholesterol: 189 mg/dL (ref 0–200)
HDL: 29.8 mg/dL — ABNORMAL LOW (ref >39.00)
NonHDL: 159.61
Total CHOL/HDL Ratio: 6
Triglycerides: 206 mg/dL — ABNORMAL HIGH (ref 0.0–149.0)
VLDL: 41.2 mg/dL — ABNORMAL HIGH (ref 0.0–40.0)

## 2020-10-11 LAB — LDL CHOLESTEROL, DIRECT: Direct LDL: 127 mg/dL

## 2020-10-16 ENCOUNTER — Other Ambulatory Visit: Payer: Self-pay | Admitting: Family Medicine

## 2020-10-16 DIAGNOSIS — E785 Hyperlipidemia, unspecified: Secondary | ICD-10-CM

## 2020-11-01 ENCOUNTER — Other Ambulatory Visit: Payer: Self-pay | Admitting: Neurology

## 2020-11-13 ENCOUNTER — Encounter: Payer: Self-pay | Admitting: Neurology

## 2020-11-13 ENCOUNTER — Telehealth (INDEPENDENT_AMBULATORY_CARE_PROVIDER_SITE_OTHER): Payer: BC Managed Care – PPO | Admitting: Neurology

## 2020-11-13 ENCOUNTER — Other Ambulatory Visit: Payer: Self-pay

## 2020-11-13 VITALS — Ht 68.0 in | Wt 196.0 lb

## 2020-11-13 DIAGNOSIS — G3109 Other frontotemporal dementia: Secondary | ICD-10-CM | POA: Diagnosis not present

## 2020-11-13 DIAGNOSIS — F0280 Dementia in other diseases classified elsewhere without behavioral disturbance: Secondary | ICD-10-CM

## 2020-11-13 MED ORDER — RIVASTIGMINE TARTRATE 1.5 MG PO CAPS: 1.5000 mg | ORAL_CAPSULE | Freq: Two times a day (BID) | ORAL | 1 refills | Status: DC

## 2020-11-13 MED ORDER — SERTRALINE HCL 25 MG PO TABS: 25.0000 mg | ORAL_TABLET | Freq: Every day | ORAL | 1 refills | Status: DC
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
## 2020-12-19 ENCOUNTER — Other Ambulatory Visit: Payer: Self-pay

## 2020-12-19 ENCOUNTER — Other Ambulatory Visit: Payer: BC Managed Care – PPO

## 2020-12-19 ENCOUNTER — Inpatient Hospital Stay: Payer: BC Managed Care – PPO | Attending: Internal Medicine

## 2020-12-19 DIAGNOSIS — E119 Type 2 diabetes mellitus without complications: Secondary | ICD-10-CM | POA: Insufficient documentation

## 2020-12-19 DIAGNOSIS — Z79899 Other long term (current) drug therapy: Secondary | ICD-10-CM | POA: Diagnosis not present

## 2020-12-19 DIAGNOSIS — C9000 Multiple myeloma not having achieved remission: Secondary | ICD-10-CM | POA: Insufficient documentation

## 2020-12-19 LAB — CBC WITH DIFFERENTIAL (CANCER CENTER ONLY)
Abs Immature Granulocytes: 0.01 10*3/uL (ref 0.00–0.07)
Basophils Absolute: 0 10*3/uL (ref 0.0–0.1)
Basophils Relative: 1 %
Eosinophils Absolute: 0.2 10*3/uL (ref 0.0–0.5)
Eosinophils Relative: 3 %
HCT: 38.7 % (ref 36.0–46.0)
Hemoglobin: 12.5 g/dL (ref 12.0–15.0)
Immature Granulocytes: 0 %
Lymphocytes Relative: 45 %
Lymphs Abs: 2.7 10*3/uL (ref 0.7–4.0)
MCH: 27.8 pg (ref 26.0–34.0)
MCHC: 32.3 g/dL (ref 30.0–36.0)
MCV: 86.2 fL (ref 80.0–100.0)
Monocytes Absolute: 0.5 10*3/uL (ref 0.1–1.0)
Monocytes Relative: 8 %
Neutro Abs: 2.6 10*3/uL (ref 1.7–7.7)
Neutrophils Relative %: 43 %
Platelet Count: 163 10*3/uL (ref 150–400)
RBC: 4.49 MIL/uL (ref 3.87–5.11)
RDW: 13.3 % (ref 11.5–15.5)
WBC Count: 6 10*3/uL (ref 4.0–10.5)
nRBC: 0 % (ref 0.0–0.2)

## 2020-12-19 LAB — LACTATE DEHYDROGENASE: LDH: 162 U/L (ref 98–192)

## 2020-12-19 LAB — CMP (CANCER CENTER ONLY)
ALT: 22 U/L (ref 0–44)
AST: 23 U/L (ref 15–41)
Albumin: 3.6 g/dL (ref 3.5–5.0)
Alkaline Phosphatase: 56 U/L (ref 38–126)
Anion gap: 11 (ref 5–15)
BUN: 11 mg/dL (ref 8–23)
CO2: 23 mmol/L (ref 22–32)
Calcium: 9.3 mg/dL (ref 8.9–10.3)
Chloride: 107 mmol/L (ref 98–111)
Creatinine: 1.03 mg/dL — ABNORMAL HIGH (ref 0.44–1.00)
GFR, Estimated: 58 mL/min — ABNORMAL LOW (ref >60)
Glucose, Bld: 107 mg/dL — ABNORMAL HIGH (ref 70–99)
Potassium: 3.2 mmol/L — ABNORMAL LOW (ref 3.5–5.1)
Sodium: 141 mmol/L (ref 135–145)
Total Bilirubin: 0.8 mg/dL (ref 0.3–1.2)
Total Protein: 7.3 g/dL (ref 6.5–8.1)

## 2020-12-20 LAB — KAPPA/LAMBDA LIGHT CHAINS
Kappa free light chain: 24.4 mg/L — ABNORMAL HIGH (ref 3.3–19.4)
Kappa, lambda light chain ratio: 1.31 (ref 0.26–1.65)
Lambda free light chains: 18.6 mg/L (ref 5.7–26.3)

## 2020-12-20 LAB — IGG, IGA, IGM
IgA: 455 mg/dL — ABNORMAL HIGH (ref 87–352)
IgG (Immunoglobin G), Serum: 1543 mg/dL (ref 586–1602)
IgM (Immunoglobulin M), Srm: 36 mg/dL (ref 26–217)

## 2020-12-20 LAB — BETA 2 MICROGLOBULIN, SERUM: Beta-2 Microglobulin: 1.5 mg/L (ref 0.6–2.4)

## 2020-12-25 ENCOUNTER — Other Ambulatory Visit: Payer: Self-pay

## 2020-12-25 ENCOUNTER — Inpatient Hospital Stay (HOSPITAL_BASED_OUTPATIENT_CLINIC_OR_DEPARTMENT_OTHER): Payer: BC Managed Care – PPO | Admitting: Internal Medicine

## 2020-12-25 VITALS — BP 132/83 | HR 95 | Temp 98.0°F | Resp 19 | Ht 68.0 in | Wt 186.8 lb

## 2020-12-25 DIAGNOSIS — I10 Essential (primary) hypertension: Secondary | ICD-10-CM

## 2020-12-25 DIAGNOSIS — C9001 Multiple myeloma in remission: Secondary | ICD-10-CM

## 2020-12-25 DIAGNOSIS — C9000 Multiple myeloma not having achieved remission: Secondary | ICD-10-CM | POA: Diagnosis not present

## 2020-12-25 NOTE — Progress Notes (Signed)
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  PRIOR THERAPY: 1) Systemic chemotherapy with Carfilzomib, Cytoxan and dexamethasone. First dose on 11/15/2013. Status post 5 cycles. 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 3) Maintenance Revlimid 10 mg by mouth daily. Status post 2 months of treatment. 4) Maintenance Revlimid 15 mg by mouth daily. Status post 4 months of treatment. 5) Maintenance treatment with Revlimid 10 mg by mouth daily status post 17 months. She completed 2 years of treatment in February 2018.  CURRENT THERAPY: Observation.  INTERVAL HISTORY: Victoria Gates 70 y.o. female returns to the clinic today for follow-up visit accompanied by her husband.  The patient is feeling fine today with no concerning complaints.  She has no significant chest pain, shortness of breath, cough or hemoptysis.  She denied having any fever or chills.  She has no nausea, vomiting, diarrhea or constipation.  She has no headache or visual changes.  She denied having any significant weight loss or night sweats.  She is here today for evaluation with repeat myeloma panel.  MEDICAL HISTORY: Past Medical History:  Diagnosis Date  . Allergic rhinitis 01/02/2009  . Bradycardia 04/29/2016  . Cervical Radiculopathy 02/01/2010   Left side  . Chest pain 04/15/2012   Ex MV: Ex time 10 mins, EF 63%, no ischemia/infarct. Occas PAC's/PVC's.  . Diabetes mellitus, type II 03/18/2008  . Encounter for antineoplastic chemotherapy 04/29/2016  . Esophageal Stricture 04/15/2007   s/p dilitation  . GERD (gastroesophageal reflux disease) 09/21/2010  . Herpes zoster 11/05/2010  . Hiatal Hernia 04/15/2007  . History of colonic polyps 04/24/2007   Hyperplastic only  .  Hypercholesterolemia 07/03/2009  . Hypokalemia 11/30/2015  . Iron deficiency anemia 04/24/2007  . Multiple myeloma ([place]) 02/2014  . Osteoporosis 04/24/2007    ALLERGIES:  is allergic to atorvastatin, rosuvastatin, and sulfonamide derivatives.  MEDICATIONS:  Current Outpatient Medications  Medication Sig Dispense Refill  . Ascorbic Acid (VITAMIN C) 1000 MG tablet Take 1,000 mg by mouth daily.     . Calcium 500-125 MG-UNIT TABS Take 1 tablet by mouth daily.    . cholecalciferol (VITAMIN D) 1000 UNITS tablet Take 1,000 Units by mouth daily.    . lansoprazole (PREVACID) 30 MG capsule Take 30 mg by mouth daily with breakfast.     . Pitavastatin Calcium (LIVALO) 1 MG TABS Take 1 tablet (1 mg total) by mouth at bedtime. 90 tablet 1  . Pitavastatin Calcium (LIVALO) 2 MG TABS Take 1 tablet (2 mg total) by mouth daily. 30 tablet 2  . Potassium Chloride ER 20 MEQ TBCR Take 20 mEq by mouth daily. 7 tablet 0  . rivastigmine (EXELON) 1.5 MG capsule Take 1 capsule (1.5 mg total) by mouth 2 (two) times daily. 180 capsule 1  . sertraline (ZOLOFT) 25 MG tablet Take 1 tablet (25 mg total) by mouth daily. 90 tablet 1   No current facility-administered medications for this visit.    SURGICAL HISTORY:  Past Surgical History:  Procedure Laterality Date  . ESOPHAGOGASTRODUODENOSCOPY  04/15/2007  . TUBAL LIGATION      REVIEW OF SYSTEMS:  A comprehensive review of systems was negative.   PHYSICAL EXAMINATION: General appearance: alert, cooperative and no distress Head: Normocephalic, without obvious abnormality, atraumatic Neck: no adenopathy,  no JVD, supple, symmetrical, trachea midline and thyroid not enlarged, symmetric, no tenderness/mass/nodules Lymph nodes: Cervical, supraclavicular, and axillary nodes normal. Resp: clear to auscultation bilaterally Back: symmetric, no curvature. ROM normal. No CVA tenderness. Cardio: regular rate and rhythm, S1, S2 normal, no murmur, click, rub or gallop GI: soft,  non-tender; bowel sounds normal; no masses,  no organomegaly Extremities: extremities normal, atraumatic, no cyanosis or edema  ECOG PERFORMANCE STATUS: 1 - Symptomatic but completely ambulatory  Blood pressure 132/83, pulse 95, temperature 98 F (36.7 C), temperature source Tympanic, resp. rate 19, height _0  (1.727 m), weight 186 lb 12.8 oz (84.7 kg), SpO2 99 %.  LABORATORY DATA: Lab Results  Component Value Date   WBC 6.0 12/19/2020   HGB 12.5 12/19/2020   HCT 38.7 12/19/2020   MCV 86.2 12/19/2020   PLT 163 12/19/2020      Chemistry      Component Value Date/Time   NA 141 12/19/2020 0742   NA 139 08/13/2017 0816   K 3.2 (L) 12/19/2020 0742   K 3.2 (L) 08/13/2017 0816   CL 107 12/19/2020 0742   CO2 23 12/19/2020 0742   CO2 26 08/13/2017 0816   BUN 11 12/19/2020 0742   BUN 9.0 08/13/2017 0816   CREATININE 1.03 (H) 12/19/2020 0742   CREATININE 1.04 (H) 07/03/2020 0843   CREATININE 1.0 08/13/2017 0816      Component Value Date/Time   CALCIUM 9.3 12/19/2020 0742   CALCIUM 9.2 08/13/2017 0816   ALKPHOS 56 12/19/2020 0742   ALKPHOS 59 08/13/2017 0816   AST 23 12/19/2020 0742   AST 22 08/13/2017 0816   ALT 22 12/19/2020 0742   ALT 17 08/13/2017 0816   BILITOT 0.8 12/19/2020 0742   BILITOT 0.80 08/13/2017 0816       RADIOGRAPHIC STUDIES: No results found.  ASSESSMENT AND PLAN:  This is a very pleasant 70 years old African-American female with multiple myeloma, IgA subtype status post induction systemic chemotherapy with Carfilzomib, Cytoxan and dexamethasone followed by peripheral blood autologous stem cell transplant followed by 2 years maintenance of treatment with Revlimid. The patient is currently on observation and she is feeling fine today with no concerning complaints. She had repeat myeloma panel performed recently.  I discussed the results with the patient and her husband today. Her myeloma panel showed no evidence for disease progression. I recommended  for her to continue on observation with repeat myeloma panel in 6 months. She was advised to call immediately if she has any concerning symptoms in the interval. The patient voices understanding of current disease status and treatment options and is in agreement with the current care plan. All questions were answered. The patient knows to call the clinic with any problems, questions or concerns. We can certainly see the patient much sooner if necessary.  Disclaimer: This note was dictated with voice recognition software. Similar sounding words can inadvertently be transcribed and may not be corrected upon review.

## 2020-12-26 ENCOUNTER — Ambulatory Visit: Payer: BC Managed Care – PPO | Admitting: Internal Medicine

## 2021-01-01 ENCOUNTER — Ambulatory Visit: Payer: BC Managed Care – PPO | Admitting: Internal Medicine

## 2021-02-15 ENCOUNTER — Other Ambulatory Visit: Payer: Self-pay | Admitting: Family Medicine

## 2021-02-15 MED ORDER — LIVALO 2 MG PO TABS: 2.0000 mg | ORAL_TABLET | Freq: Every day | ORAL | 0 refills | Status: DC

## 2021-03-28 ENCOUNTER — Telehealth: Payer: Self-pay | Admitting: Family Medicine

## 2021-03-28 NOTE — Telephone Encounter (Signed)
Pt's spouse dropped off document to be filled out by provider (2 pages dental form -[facility]) Pt would like document to be faxed when ready at [phone]. Document put at front office tray under providers name.

## 2021-03-28 NOTE — Telephone Encounter (Signed)
Spoke with pt's husband. Pt scheduled for next week to have forms completed

## 2021-04-03 ENCOUNTER — Encounter: Payer: Self-pay | Admitting: Family Medicine

## 2021-04-03 ENCOUNTER — Other Ambulatory Visit: Payer: Self-pay

## 2021-04-03 ENCOUNTER — Ambulatory Visit (INDEPENDENT_AMBULATORY_CARE_PROVIDER_SITE_OTHER): Payer: BC Managed Care – PPO | Admitting: Family Medicine

## 2021-04-03 VITALS — BP 110/80 | HR 90 | Temp 97.3°F | Resp 18 | Ht 68.0 in | Wt 159.4 lb

## 2021-04-03 DIAGNOSIS — Z9484 Stem cells transplant status: Secondary | ICD-10-CM

## 2021-04-03 DIAGNOSIS — E1169 Type 2 diabetes mellitus with other specified complication: Secondary | ICD-10-CM

## 2021-04-03 DIAGNOSIS — E1165 Type 2 diabetes mellitus with hyperglycemia: Secondary | ICD-10-CM

## 2021-04-03 DIAGNOSIS — G3109 Other frontotemporal dementia: Secondary | ICD-10-CM

## 2021-04-03 DIAGNOSIS — C9000 Multiple myeloma not having achieved remission: Secondary | ICD-10-CM

## 2021-04-03 DIAGNOSIS — Z01818 Encounter for other preprocedural examination: Secondary | ICD-10-CM | POA: Diagnosis not present

## 2021-04-03 DIAGNOSIS — F0280 Dementia in other diseases classified elsewhere without behavioral disturbance: Secondary | ICD-10-CM | POA: Insufficient documentation

## 2021-04-03 DIAGNOSIS — E119 Type 2 diabetes mellitus without complications: Secondary | ICD-10-CM

## 2021-04-03 DIAGNOSIS — I10 Essential (primary) hypertension: Secondary | ICD-10-CM

## 2021-04-03 DIAGNOSIS — E785 Hyperlipidemia, unspecified: Secondary | ICD-10-CM

## 2021-04-03 DIAGNOSIS — D701 Agranulocytosis secondary to cancer chemotherapy: Secondary | ICD-10-CM

## 2021-04-03 LAB — CBC WITH DIFFERENTIAL/PLATELET
Basophils Absolute: 0 10*3/uL (ref 0.0–0.1)
Basophils Relative: 0.7 % (ref 0.0–3.0)
Eosinophils Absolute: 0.1 10*3/uL (ref 0.0–0.7)
Eosinophils Relative: 1.6 % (ref 0.0–5.0)
HCT: 39.4 % (ref 36.0–46.0)
Hemoglobin: 13.3 g/dL (ref 12.0–15.0)
Lymphocytes Relative: 40.8 % (ref 12.0–46.0)
Lymphs Abs: 2.5 10*3/uL (ref 0.7–4.0)
MCHC: 33.7 g/dL (ref 30.0–36.0)
MCV: 86.5 fl (ref 78.0–100.0)
Monocytes Absolute: 0.6 10*3/uL (ref 0.1–1.0)
Monocytes Relative: 9.7 % (ref 3.0–12.0)
Neutro Abs: 2.9 10*3/uL (ref 1.4–7.7)
Neutrophils Relative %: 47.2 % (ref 43.0–77.0)
Platelets: 175 10*3/uL (ref 150.0–400.0)
RBC: 4.55 Mil/uL (ref 3.87–5.11)
RDW: 14.3 % (ref 11.5–15.5)
WBC: 6.1 10*3/uL (ref 4.0–10.5)

## 2021-04-03 LAB — LIPID PANEL
Cholesterol: 101 mg/dL (ref 0–200)
HDL: 25.3 mg/dL — ABNORMAL LOW (ref >39.00)
LDL Cholesterol: 56 mg/dL (ref 0–99)
NonHDL: 76.02
Total CHOL/HDL Ratio: 4
Triglycerides: 102 mg/dL (ref 0.0–149.0)
VLDL: 20.4 mg/dL (ref 0.0–40.0)

## 2021-04-03 LAB — HEMOGLOBIN A1C: Hgb A1c MFr Bld: 5.9 % (ref 4.6–6.5)

## 2021-04-03 LAB — COMPREHENSIVE METABOLIC PANEL
ALT: 27 U/L (ref 0–35)
AST: 28 U/L (ref 0–37)
Albumin: 3.7 g/dL (ref 3.5–5.2)
Alkaline Phosphatase: 57 U/L (ref 39–117)
BUN: 7 mg/dL (ref 6–23)
CO2: 29 mEq/L (ref 19–32)
Calcium: 9.4 mg/dL (ref 8.4–10.5)
Chloride: 98 mEq/L (ref 96–112)
Creatinine, Ser: 0.82 mg/dL (ref 0.40–1.20)
GFR: 72.46 mL/min (ref >60.00)
Glucose, Bld: 99 mg/dL (ref 70–99)
Potassium: 3.3 mEq/L — ABNORMAL LOW (ref 3.5–5.1)
Sodium: 136 mEq/L (ref 135–145)
Total Bilirubin: 1.9 mg/dL — ABNORMAL HIGH (ref 0.2–1.2)
Total Protein: 7.2 g/dL (ref 6.0–8.3)

## 2021-04-03 NOTE — Assessment & Plan Note (Signed)
Tolerating statin, encouraged heart healthy diet, avoid trans fats, minimize simple carbs and saturated fats. Increase exercise as tolerated 

## 2021-04-03 NOTE — Assessment & Plan Note (Signed)
Per oncology °

## 2021-04-03 NOTE — Assessment & Plan Note (Signed)
Well controlled, no changes to meds. Encouraged heart healthy diet such as the DASH diet and exercise as tolerated.  °

## 2021-04-03 NOTE — Assessment & Plan Note (Signed)
Per endo  Stable -- on no meds

## 2021-04-03 NOTE — Assessment & Plan Note (Signed)
Unable to do ekg due to pt not able to sit still on table Otherwise pt stable for dental procedure

## 2021-04-03 NOTE — Progress Notes (Signed)
Established Patient Office Visit  Subjective:  Patient ID: Victoria Gates, female    DOB: Nov 03, 1950  Age: 70 y.o. MRN: [id number]  CC:  Chief Complaint  Patient presents with   Pre-Anesthesia Assessment    HPI Victoria Gates presents for clearance for dental procedure.   Her husband is with her.   Pt has a hx of neurocognitive disorder and gets anxious when she leaves the house and laughs and does not sit still   Past Medical History:  Diagnosis Date   Allergic rhinitis 01/02/2009   Bradycardia 04/29/2016   Cervical Radiculopathy 02/01/2010   Left side   Chest pain 04/15/2012   Ex MV: Ex time 10 mins, EF 63%, no ischemia/infarct. Occas PAC's/PVC's.   Diabetes mellitus, type II 03/18/2008   Encounter for antineoplastic chemotherapy 04/29/2016   Esophageal Stricture 04/15/2007   s/p dilitation   GERD (gastroesophageal reflux disease) 09/21/2010   Herpes zoster 11/05/2010   Hiatal Hernia 04/15/2007   History of colonic polyps 04/24/2007   Hyperplastic only   Hypercholesterolemia 07/03/2009   Hypokalemia 11/30/2015   Iron deficiency anemia 04/24/2007   Multiple myeloma ([place]) 02/2014   Osteoporosis 04/24/2007    Past Surgical History:  Procedure Laterality Date   ESOPHAGOGASTRODUODENOSCOPY  04/15/2007   TUBAL LIGATION      Family History  Problem Relation Age of Onset   Brain cancer Mother    Heart disease Mother    Lung cancer Father    Heart disease Father    Diabetes Sister    Heart disease Sister    Diabetes Brother    Heart disease Brother    Cancer Neg Hx        No FH of Colon Cancer   Colon cancer Neg Hx    Esophageal cancer Neg Hx    Rectal cancer Neg Hx    Stomach cancer Neg Hx     Social History   Socioeconomic History   Marital status: Married    Spouse name: Not on file   Number of children: Not on file   Years of education: Not on file   Highest education level: Not on file  Occupational History   Occupation: MAIL Armed forces operational officer: [id number]  Tobacco Use   Smoking status: Never   Smokeless tobacco: Never  Vaping Use   Vaping Use: Never used  Substance and Sexual Activity   Alcohol use: No   Drug use: No   Sexual activity: Yes  Other Topics Concern   Not on file  Social History Narrative   Lives in [place] with spouse.   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   Right Handed   Social Determinants of Health   Financial Resource Strain: Not on file  Food Insecurity: Not on file  Transportation Needs: Not on file  Physical Activity: Not on file  Stress: Not on file  Social Connections: Not on file  Intimate Partner Violence: Not on file    Outpatient Medications Prior to Visit  Medication Sig Dispense Refill   Ascorbic Acid (VITAMIN C) 1000 MG tablet Take 1,000 mg by mouth daily.      Calcium 500-125 MG-UNIT TABS Take 1 tablet by mouth daily.     cholecalciferol (VITAMIN D) 1000 UNITS tablet Take 1,000 Units by mouth daily.     lansoprazole (PREVACID) 30 MG capsule Take 30 mg by mouth daily with breakfast.      Pitavastatin Calcium (LIVALO) 2 MG TABS  Take 1 tablet (2 mg total) by mouth daily. 30 tablet 0   Potassium Chloride ER 20 MEQ TBCR Take 20 mEq by mouth daily. 7 tablet 0   rivastigmine (EXELON) 1.5 MG capsule Take 1 capsule (1.5 mg total) by mouth 2 (two) times daily. 180 capsule 1   sertraline (ZOLOFT) 25 MG tablet Take 1 tablet (25 mg total) by mouth daily. 90 tablet 1   No facility-administered medications prior to visit.    Allergies  Allergen Reactions   Atorvastatin     REACTION: myalgias   Rosuvastatin     REACTION: myalgias   Sulfonamide Derivatives     Unknown reaction     ROS Review of Systems  Constitutional:  Negative for appetite change, diaphoresis, fatigue and unexpected weight change.  Eyes:  Negative for pain, redness and visual disturbance.  Respiratory:  Negative for cough, chest tightness, shortness of breath and wheezing.   Cardiovascular:   Negative for chest pain, palpitations and leg swelling.  Endocrine: Negative for cold intolerance, heat intolerance, polydipsia, polyphagia and polyuria.  Genitourinary:  Negative for difficulty urinating, dysuria and frequency.  Neurological:  Negative for dizziness, light-headedness, numbness and headaches.  Psychiatric/Behavioral:  Positive for agitation, behavioral problems and decreased concentration. Negative for confusion. The patient is nervous/anxious.      Objective:    Physical Exam Vitals and nursing note reviewed.  Constitutional:      Appearance: She is well-developed.  HENT:     Head: Normocephalic and atraumatic.  Eyes:     Conjunctiva/sclera: Conjunctivae normal.  Neck:     Thyroid: No thyromegaly.     Vascular: No carotid bruit or JVD.  Cardiovascular:     Rate and Rhythm: Normal rate and regular rhythm.     Heart sounds: Normal heart sounds. No murmur heard. Pulmonary:     Effort: Pulmonary effort is normal. No respiratory distress.     Breath sounds: Normal breath sounds. No wheezing or rales.  Chest:     Chest wall: No tenderness.  Musculoskeletal:     Cervical back: Normal range of motion and neck supple.  Neurological:     Mental Status: She is alert. She is disoriented.  Psychiatric:        Mood and Affect: Mood normal.        Behavior: Behavior normal.        Thought Content: Thought content normal.        Judgment: Judgment normal.    BP 110/80 (BP Location: Left Arm, Patient Position: Sitting, Cuff Size: Normal)   Pulse 90   Temp (!) 97.3 F (36.3 C) (Temporal)   Resp 18   Ht $R'5\' 8"'bn$  (1.727 m)   Wt 159 lb 6.4 oz (72.3 kg)   SpO2 99%   BMI 24.24 kg/m  Wt Readings from Last 3 Encounters:  04/03/21 159 lb 6.4 oz (72.3 kg)  12/25/20 186 lb 12.8 oz (84.7 kg)  11/13/20 196 lb (88.9 kg)     Health Maintenance Due  Topic Date Due   Zoster Vaccines- Shingrix (1 of 2) Never done   URINE MICROALBUMIN  09/01/2018   COVID-19 Vaccine (3 - Moderna  risk series) 01/07/2020   OPHTHALMOLOGY EXAM  03/21/2020   MAMMOGRAM  10/07/2020   HEMOGLOBIN A1C  03/07/2021    There are no preventive care reminders to display for this patient.  Lab Results  Component Value Date   TSH 4.49 12/08/2018   Lab Results  Component Value Date   WBC  6.0 12/19/2020   HGB 12.5 12/19/2020   HCT 38.7 12/19/2020   MCV 86.2 12/19/2020   PLT 163 12/19/2020   Lab Results  Component Value Date   NA 141 12/19/2020   K 3.2 (L) 12/19/2020   CHLORIDE 106 08/13/2017   CO2 23 12/19/2020   GLUCOSE 107 (H) 12/19/2020   BUN 11 12/19/2020   CREATININE 1.03 (H) 12/19/2020   BILITOT 0.8 12/19/2020   ALKPHOS 56 12/19/2020   AST 23 12/19/2020   ALT 22 12/19/2020   PROT 7.3 12/19/2020   ALBUMIN 3.6 12/19/2020   CALCIUM 9.3 12/19/2020   ANIONGAP 11 12/19/2020   EGFR >60 08/13/2017   GFR 59.43 (L) 10/11/2020   Lab Results  Component Value Date   CHOL 189 10/11/2020   Lab Results  Component Value Date   HDL 29.80 (L) 10/11/2020   Lab Results  Component Value Date   LDLCALC 115 (H) 07/03/2020   Lab Results  Component Value Date   TRIG 206.0 (H) 10/11/2020   Lab Results  Component Value Date   CHOLHDL 6 10/11/2020   Lab Results  Component Value Date   HGBA1C 5.6 09/06/2020      Assessment & Plan:   Problem List Items Addressed This Visit       Unprioritized   Diabetes ([place])    Per endo  Stable -- on no meds        Essential hypertension    Well controlled, no changes to meds. Encouraged heart healthy diet such as the DASH diet and exercise as tolerated.        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 Increase exercise as tolerated       Multiple myeloma ([place]) - Primary    Per oncology       Pre-op examination    Unable to do ekg due to pt not able to sit still on table Otherwise pt  stable for dental procedure        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[place])    Per heme/ onc       Other Visit Diagnoses     Uncontrolled type 2 diabetes mellitus with hyperglycemia ([place])       Relevant Orders   Lipid panel   Hemoglobin A1c   CBC with Differential/Platelet   Comprehensive metabolic panel   Hyperlipidemia associated with type 2 diabetes mellitus ([place])       Relevant Orders   Lipid panel   Hemoglobin A1c   CBC with Differential/Platelet   Comprehensive metabolic panel   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   Follow-up: Return in about 6 months (around 10/04/2021), or if symptoms worsen or fail to improve, for annual exam, fasting.    Ann Held, DO

## 2021-04-03 NOTE — Assessment & Plan Note (Signed)
Per heme-onc. ?

## 2021-04-03 NOTE — Assessment & Plan Note (Signed)
Per neuro 

## 2021-04-04 ENCOUNTER — Other Ambulatory Visit: Payer: Self-pay | Admitting: Internal Medicine

## 2021-04-04 ENCOUNTER — Other Ambulatory Visit: Payer: Self-pay

## 2021-04-04 DIAGNOSIS — E876 Hypokalemia: Secondary | ICD-10-CM

## 2021-04-04 MED ORDER — POTASSIUM CHLORIDE ER 20 MEQ PO TBCR: 20.0000 meq | EXTENDED_RELEASE_TABLET | Freq: Every day | ORAL | 2 refills | Status: DC

## 2021-04-16 ENCOUNTER — Emergency Department (HOSPITAL_COMMUNITY)
Admission: EM | Admit: 2021-04-16 | Discharge: 2021-04-16 | Disposition: A | Discharge status: Home / Self Care | Payer: BC Managed Care – PPO | Attending: Emergency Medicine | Admitting: Emergency Medicine

## 2021-04-16 ENCOUNTER — Encounter (HOSPITAL_COMMUNITY): Payer: Self-pay

## 2021-04-16 ENCOUNTER — Other Ambulatory Visit: Payer: Self-pay

## 2021-04-16 ENCOUNTER — Emergency Department (HOSPITAL_COMMUNITY): Payer: BC Managed Care – PPO

## 2021-04-16 DIAGNOSIS — R7401 Elevation of levels of liver transaminase levels: Secondary | ICD-10-CM | POA: Insufficient documentation

## 2021-04-16 DIAGNOSIS — Y9301 Activity, walking, marching and hiking: Secondary | ICD-10-CM | POA: Diagnosis not present

## 2021-04-16 DIAGNOSIS — Z79899 Other long term (current) drug therapy: Secondary | ICD-10-CM | POA: Insufficient documentation

## 2021-04-16 DIAGNOSIS — E119 Type 2 diabetes mellitus without complications: Secondary | ICD-10-CM | POA: Diagnosis not present

## 2021-04-16 DIAGNOSIS — R0902 Hypoxemia: Secondary | ICD-10-CM | POA: Diagnosis not present

## 2021-04-16 DIAGNOSIS — F0390 Unspecified dementia without behavioral disturbance: Secondary | ICD-10-CM | POA: Diagnosis not present

## 2021-04-16 DIAGNOSIS — S0990XA Unspecified injury of head, initial encounter: Secondary | ICD-10-CM | POA: Diagnosis not present

## 2021-04-16 DIAGNOSIS — R7989 Other specified abnormal findings of blood chemistry: Secondary | ICD-10-CM

## 2021-04-16 DIAGNOSIS — W01198A Fall on same level from slipping, tripping and stumbling with subsequent striking against other object, initial encounter: Secondary | ICD-10-CM | POA: Insufficient documentation

## 2021-04-16 DIAGNOSIS — R6889 Other general symptoms and signs: Secondary | ICD-10-CM | POA: Diagnosis not present

## 2021-04-16 DIAGNOSIS — I10 Essential (primary) hypertension: Secondary | ICD-10-CM | POA: Insufficient documentation

## 2021-04-16 DIAGNOSIS — W19XXXA Unspecified fall, initial encounter: Secondary | ICD-10-CM

## 2021-04-16 DIAGNOSIS — R404 Transient alteration of awareness: Secondary | ICD-10-CM | POA: Diagnosis not present

## 2021-04-16 DIAGNOSIS — Z743 Need for continuous supervision: Secondary | ICD-10-CM | POA: Diagnosis not present

## 2021-04-16 DIAGNOSIS — G319 Degenerative disease of nervous system, unspecified: Secondary | ICD-10-CM | POA: Diagnosis not present

## 2021-04-16 LAB — COMPREHENSIVE METABOLIC PANEL
ALT: 60 U/L — ABNORMAL HIGH (ref 0–44)
AST: 75 U/L — ABNORMAL HIGH (ref 15–41)
Albumin: 4.2 g/dL (ref 3.5–5.0)
Alkaline Phosphatase: 59 U/L (ref 38–126)
Anion gap: 12 (ref 5–15)
BUN: 17 mg/dL (ref 8–23)
CO2: 22 mmol/L (ref 22–32)
Calcium: 9.6 mg/dL (ref 8.9–10.3)
Chloride: 100 mmol/L (ref 98–111)
Creatinine, Ser: 1 mg/dL (ref 0.44–1.00)
GFR, Estimated: >60 mL/min (ref >60)
Glucose, Bld: 105 mg/dL — ABNORMAL HIGH (ref 70–99)
Potassium: 5.6 mmol/L — ABNORMAL HIGH (ref 3.5–5.1)
Sodium: 134 mmol/L — ABNORMAL LOW (ref 135–145)
Total Bilirubin: 3.1 mg/dL — ABNORMAL HIGH (ref 0.3–1.2)
Total Protein: 8.5 g/dL — ABNORMAL HIGH (ref 6.5–8.1)

## 2021-04-16 LAB — CBC WITH DIFFERENTIAL/PLATELET
Abs Immature Granulocytes: 0.03 10*3/uL (ref 0.00–0.07)
Basophils Absolute: 0 10*3/uL (ref 0.0–0.1)
Basophils Relative: 1 %
Eosinophils Absolute: 0.1 10*3/uL (ref 0.0–0.5)
Eosinophils Relative: 1 %
HCT: 48.5 % — ABNORMAL HIGH (ref 36.0–46.0)
Hemoglobin: 15.6 g/dL — ABNORMAL HIGH (ref 12.0–15.0)
Immature Granulocytes: 1 %
Lymphocytes Relative: 32 %
Lymphs Abs: 1.8 10*3/uL (ref 0.7–4.0)
MCH: 28.8 pg (ref 26.0–34.0)
MCHC: 32.2 g/dL (ref 30.0–36.0)
MCV: 89.5 fL (ref 80.0–100.0)
Monocytes Absolute: 0.5 10*3/uL (ref 0.1–1.0)
Monocytes Relative: 9 %
Neutro Abs: 3.1 10*3/uL (ref 1.7–7.7)
Neutrophils Relative %: 56 %
Platelets: 183 10*3/uL (ref 150–400)
RBC: 5.42 MIL/uL — ABNORMAL HIGH (ref 3.87–5.11)
RDW: 14.7 % (ref 11.5–15.5)
WBC: 5.4 10*3/uL (ref 4.0–10.5)
nRBC: 0 % (ref 0.0–0.2)

## 2021-04-16 LAB — I-STAT CHEM 8, ED
BUN: 17 mg/dL (ref 8–23)
Calcium, Ion: 0.92 mmol/L — ABNORMAL LOW (ref 1.15–1.40)
Chloride: 109 mmol/L (ref 98–111)
Creatinine, Ser: 0.6 mg/dL (ref 0.44–1.00)
Glucose, Bld: 95 mg/dL (ref 70–99)
HCT: 45 % (ref 36.0–46.0)
Hemoglobin: 15.3 g/dL — ABNORMAL HIGH (ref 12.0–15.0)
Potassium: 4.1 mmol/L (ref 3.5–5.1)
Sodium: 138 mmol/L (ref 135–145)
TCO2: 21 mmol/L — ABNORMAL LOW (ref 22–32)

## 2021-04-16 MED ORDER — SODIUM CHLORIDE 0.9 % IV BOLUS
1000.0000 mL | Freq: Once | INTRAVENOUS | Status: AC
  Administered 2021-04-16: 1000 mL via INTRAVENOUS

## 2021-04-16 MED ORDER — LORAZEPAM 0.5 MG PO TABS
0.5000 mg | ORAL_TABLET | Freq: Once | ORAL | Status: AC
  Administered 2021-04-16: 0.5 mg via ORAL
  Filled 2021-04-16: qty 1

## 2021-04-16 NOTE — ED Provider Notes (Signed)
[facility] DEPT Provider Note   CSN: [id number] Arrival date & time: 04/16/21  1126     History Chief Complaint  Patient presents with   Victoria Gates is a 70 y.o. female.  Level 5 caveat secondary to dementia.  Patient is brought in by her husband for evaluation of head injury after a fall last night.  He said she was walking back from the bathroom and fell striking her head on the floor.  No reported loss of consciousness.  He said she needs some dental surgery and her teeth are been bothering her so she has not been eating well.  He questions whether she may be dehydrated.  Patient herself cannot give any history  The history is provided by the spouse.  Fall This is a new problem. The problem has not changed since onset.Nothing aggravates the symptoms. Nothing relieves the symptoms. She has tried rest for the symptoms. The treatment provided no relief.      Past Medical History:  Diagnosis Date   Allergic rhinitis 01/02/2009   Bradycardia 04/29/2016   Cervical Radiculopathy 02/01/2010   Left side   Chest pain 04/15/2012   Ex MV: Ex time 10 mins, EF 63%, no ischemia/infarct. Occas PAC's/PVC's.   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   Pre-op examination 04/03/2021   Hyperlipidemia 07/03/2020   Mild neurocognitive disorder, 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   Bradycardia 04/29/2016    Hypokalemia 11/30/2015   Neutropenia ([place]) 05/03/2015   Syncope 09/11/2014   Bronchitis 09/08/2014   Multiple myeloma ([place]) 10/22/2013   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   Hyperproteinemia 04/14/2013   Midsternal chest pain 04/20/2012   Lipoma of shoulder 11/02/2011   Herpes zoster 11/05/2010   GERD (gastroesophageal reflux disease) 09/21/2010   Cervical Radiculopathy, Left 02/01/2010   Hypercholesterolemia 07/03/2009   Otitis Externa 07/03/2009   Cerumen impaction 07/03/2009   Myalgia 07/03/2009   Allergic rhinitis 01/02/2009   Diabetes ([place]) 03/18/2008   Chest pain 12/24/2007   URI 12/12/2007   Anemia-Iron Deficiency 04/24/2007   Essential hypertension 04/24/2007   Osteoporosis 04/24/2007   Esophageal Stricture 04/15/2007   Hiatal Hernia 04/15/2007    Past Surgical History:  Procedure Laterality Date   ESOPHAGOGASTRODUODENOSCOPY  04/15/2007   TUBAL LIGATION       OB History   No obstetric history on file.     Family History  Problem Relation Age of Onset   Brain cancer Mother    Heart disease Mother    Lung cancer Father    Heart disease Father    Diabetes Sister    Heart disease Sister    Diabetes Brother    Heart disease Brother    Cancer Neg Hx        No FH of Colon Cancer   Colon cancer Neg Hx    Esophageal cancer Neg Hx    Rectal cancer Neg Hx    Stomach cancer Neg Hx  Social History   Tobacco Use   Smoking status: Never   Smokeless tobacco: Never  Vaping Use   Vaping Use: Never used  Substance Use Topics   Alcohol use: No   Drug use: No    Home Medications Prior to Admission medications   Medication Sig Start Date End Date Taking? Authorizing Provider  Ascorbic Acid (VITAMIN C) 1000 MG tablet Take 1,000 mg by mouth daily.     [provider]  Calcium 500-125 MG-UNIT TABS Take 1 tablet by mouth daily.    [provider]  cholecalciferol (VITAMIN D) 1000 UNITS tablet Take 1,000 Units by mouth daily.    [provider]  lansoprazole (PREVACID) 30 MG capsule Take 30 mg by mouth daily with breakfast.     [provider]  Pitavastatin Calcium (LIVALO) 2 MG TABS Take 1 tablet (2 mg total) by mouth daily. 02/15/21   Ann Held, DO  Potassium Chloride ER 20 MEQ TBCR Take 20 mEq by mouth daily. 04/04/21   Ann Held, DO  rivastigmine (EXELON) 1.5 MG capsule Take 1 capsule (1.5 mg total) by mouth 2 (two) times daily. 11/13/20   Narda Amber K, DO  sertraline (ZOLOFT) 25 MG tablet Take 1 tablet (25 mg total) by mouth daily. 11/13/20   Narda Amber K, DO    Allergies    Atorvastatin, Rosuvastatin, and Sulfonamide derivatives  Review of Systems   Review of Systems  Unable to perform ROS: Dementia   Physical Exam Updated Vital Signs BP 93/73 (BP Location: Left Arm)   Pulse 100   Temp 98.4 F (36.9 C) (Oral)   Resp 16   Ht 5' 8" (1.727 m)   SpO2 100%   BMI 24.24 kg/m   Physical Exam Vitals and nursing note reviewed.  Constitutional:      General: She is not in acute distress.    Appearance: Normal appearance. She is well-developed.  HENT:     Head: Normocephalic and atraumatic.  Eyes:     Conjunctiva/sclera: Conjunctivae normal.  Cardiovascular:     Rate and Rhythm: Normal rate and regular rhythm.     Heart sounds: No murmur heard. Pulmonary:     Effort: Pulmonary effort is normal. No respiratory distress.     Breath sounds: Normal breath sounds.  Abdominal:     Palpations: Abdomen is soft.     Tenderness: There is no abdominal tenderness. There is no guarding or rebound.  Musculoskeletal:        General: No deformity or signs of injury. Normal range of motion.     Cervical back: Neck supple.  Skin:    General: Skin is warm and dry.  Neurological:     Mental Status: She is alert.     Comments: Patient is awake and will follow some commands.  Not really speaking.  Moving all extremities without any focal deficits.    ED Results / Procedures /  Treatments   Labs (all labs ordered are listed, but only abnormal results are displayed) Labs Reviewed  COMPREHENSIVE METABOLIC PANEL - Abnormal; Notable for the following components:      Result Value   Sodium 134 (*)    Potassium 5.6 (*)    Glucose, Bld 105 (*)    Total Protein 8.5 (*)    AST 75 (*)    ALT 60 (*)    Total Bilirubin 3.1 (*)    All other components within normal limits  CBC WITH DIFFERENTIAL/PLATELET -  Abnormal; Notable for the following components:   RBC 5.42 (*)    Hemoglobin 15.6 (*)    HCT 48.5 (*)    All other components within normal limits  I-STAT CHEM 8, ED - Abnormal; Notable for the following components:   Calcium, Ion 0.92 (*)    TCO2 21 (*)    Hemoglobin 15.3 (*)    All other components within normal limits    EKG None  Radiology CT Head Wo Contrast  Result Date: 04/16/2021 CLINICAL DATA:  Status post fall today with a blow to the head. Initial encounter. EXAM: CT HEAD WITHOUT CONTRAST TECHNIQUE: Contiguous axial images were obtained from the base of the skull through the vertex without intravenous contrast. COMPARISON:  Brain MRI 03/08/2019. FINDINGS: Brain: No evidence of acute infarction, hemorrhage, hydrocephalus, extra-axial collection or mass lesion/mass effect. Cortical atrophy noted. Vascular: No hyperdense vessel or unexpected calcification. Skull: No fracture or focal lesion. Sinuses/Orbits: Negative. Other: None. IMPRESSION: No acute abnormality. Electronically Signed   By: Inge Rise M.D.   On: 04/16/2021 14:00    Procedures Procedures   Medications Ordered in ED Medications  sodium chloride 0.9 % bolus 1,000 mL (has no administration in time range)    ED Course  I have reviewed the triage vital signs and the nursing notes.  Pertinent labs & imaging results that were available during my care of the patient were reviewed by me and considered in my medical decision making (see chart for details).  Clinical Course as of  04/17/21 0856  Mon Apr 16, 2021  1456 Repeat i-STAT shows normal potassium.  Question whether initial 1 was hemolyzed.  Has no abdominal pain.  Will review with husband and anticipate discharge. [MB]  1856 LFTs are mildly elevated.  Recommend to husband that patient gets this rechecked with primary care.  Return instructions discussed [MB]    Clinical Course User Index [MB] Hayden Rasmussen, MD   MDM Rules/Calculators/A&P                         This patient complains of fall and head injury possible dehydration; this involves an extensive number of treatment Options and is a complaint that carries with it a high risk of complications and Morbidity. The differential includes skull fracture, head injury, intracranial bleed, dehydration, renal failure, metabolic derangement  I ordered, reviewed and interpreted labs, which included CBC with normal white count hemoglobin slightly higher than baseline reflecting possibly some dehydration, chemistries with elevated potassium elevated AST ALT and bili.  Repeated with i-STAT and potassium normalized.  Question hemolyzed sample.  Patient has no abdominal pain. I ordered medication IV fluids I ordered imaging studies which included CT head and I independently    visualized and interpreted imaging which showed atrophy no acute findings Additional history obtained from patient's husband Previous records obtained and reviewed in epic, patient has some frontal demenia  After the interventions stated above, I reevaluated the patient and found patient to be hemodynamically stable.  She is happy to be discharged.  Reviewed follow-up instructions with husband.  Return instructions discussed   Final Clinical Impression(s) / ED Diagnoses Final diagnoses:  Fall, initial encounter  Injury of head, initial encounter  LFT elevation    Rx / DC Orders ED Discharge Orders     None        Hayden Rasmussen, MD 04/17/21 0900

## 2021-04-16 NOTE — ED Notes (Signed)
Unable to complete Triage. Patient has a hsitory of dementia and is unable to answer questions.

## 2021-04-16 NOTE — Discharge Instructions (Addendum)
Your wife was seen in the emergency department for evaluation of injuries from a fall.  She had a CAT scan of her head that did not show any acute findings.  She had some blood work done and was given some IV fluids.  Her liver numbers were mildly elevated and this will need to be rechecked with her primary care doctor.  Please encourage fluids and food.  Tylenol as needed for pain.  Return to the emergency department if any worsening or concerning symptoms.

## 2021-04-16 NOTE — ED Triage Notes (Signed)
Per EMS- Patient is from home. Patient's husband is the caretaker. Patient was walking back from the bathroom and slipped falling backwards on a carpeted floor. Patient's husband reports that the patient hit her head. No hematoma or other injuries noted. Patient does not c/o any pain.

## 2021-04-26 ENCOUNTER — Other Ambulatory Visit: Payer: Self-pay | Admitting: Family Medicine

## 2021-04-26 DIAGNOSIS — E876 Hypokalemia: Secondary | ICD-10-CM

## 2021-05-14 ENCOUNTER — Encounter: Payer: Self-pay | Admitting: Neurology

## 2021-05-14 ENCOUNTER — Other Ambulatory Visit: Payer: Self-pay

## 2021-05-14 ENCOUNTER — Ambulatory Visit (INDEPENDENT_AMBULATORY_CARE_PROVIDER_SITE_OTHER): Payer: BC Managed Care – PPO | Admitting: Neurology

## 2021-05-14 VITALS — BP 136/98 | HR 133 | Ht 68.0 in | Wt 153.0 lb

## 2021-05-14 DIAGNOSIS — G1229 Other motor neuron disease: Secondary | ICD-10-CM

## 2021-05-14 DIAGNOSIS — G3109 Other frontotemporal dementia: Secondary | ICD-10-CM

## 2021-05-14 DIAGNOSIS — F0280 Dementia in other diseases classified elsewhere without behavioral disturbance: Secondary | ICD-10-CM

## 2021-05-14 MED ORDER — RIVASTIGMINE TARTRATE 1.5 MG PO CAPS: 1.5000 mg | ORAL_CAPSULE | Freq: Two times a day (BID) | ORAL | 3 refills | Status: DC

## 2021-05-14 MED ORDER — SERTRALINE HCL 25 MG PO TABS: 25.0000 mg | ORAL_TABLET | Freq: Every day | ORAL | 3 refills | Status: DC

## 2021-05-14 NOTE — Progress Notes (Signed)
Follow-up Visit   Date: 05/14/21   Victoria Gates MRN: [id number] DOB: 09-Feb-1951   Interim History: Victoria Gates is a 70 y.o. African American female returning to the clinic for follow-up of frontotemporal dementia (2019).  The patient was accompanied to the clinic by husband who also provides collateral information.  Since starting sertraline '25mg'$  daily, husband says that her behavior has significantly improved.  She is much more calm at home.  She tends to get excited and hyper when she is around unfamiliar people.  She still have inappropriate explosive laughter.  No progressive changes to her memory.  She is able to perform all ADLs without prompting.  No falls. No home safety issues because he is always home with her.  No issues with wandering.  Husband is primary caregiver.    Medications:  Current Outpatient Medications on File Prior to Visit  Medication Sig Dispense Refill   amoxicillin (AMOXIL) 875 MG tablet Take 875 mg by mouth 2 (two) times daily.     Ascorbic Acid (VITAMIN C) 1000 MG tablet Take 1,000 mg by mouth daily.      Calcium 500-125 MG-UNIT TABS Take 1 tablet by mouth daily.     cholecalciferol (VITAMIN D) 1000 UNITS tablet Take 1,000 Units by mouth daily.     ibuprofen (ADVIL) 600 MG tablet Take 600 mg by mouth every 6 (six) hours as needed.     lansoprazole (PREVACID) 30 MG capsule Take 30 mg by mouth daily with breakfast.      Pitavastatin Calcium (LIVALO) 2 MG TABS Take 1 tablet (2 mg total) by mouth daily. 30 tablet 0   Potassium Chloride ER 20 MEQ TBCR TAKE 1 TABLET BY MOUTH EVERY DAY 90 tablet 1   rivastigmine (EXELON) 1.5 MG capsule Take 1 capsule (1.5 mg total) by mouth 2 (two) times daily. 180 capsule 1   sertraline (ZOLOFT) 25 MG tablet Take 1 tablet (25 mg total) by mouth daily. 90 tablet 1   No current facility-administered medications on file prior to visit.    Allergies:  Allergies  Allergen Reactions   Atorvastatin     REACTION:  myalgias   Rosuvastatin     REACTION: myalgias   Sulfonamide Derivatives     Unknown reaction     Vital Signs:  BP (!) 136/98   Pulse (!) 133   Ht '5\' 8"'$  (1.727 m)   Wt 153 lb (69.4 kg)   SpO2 99%   BMI 23.26 kg/m   Neurological Exam: MENTAL STATUS including orientation to person, unable to correctly identify address or date.  Attention and concentration is poor, fund of knowledge is poor.  She has inappropriate and explosive laughter throughout the visit, unable to redirect. Psychomotor agitation, occasionally grabbing her her husband tightly.   She is well-groomed.  Speech is not dysarthric.   CRANIAL NERVES:   Normal conjugate, extra-ocular eye movements in all directions of gaze.  No ptosis.   MOTOR:  Motor strength is 5/5 in all extremities.   COORDINATION/GAIT:  Gait narrow based and stable.   Data: MRI brain wwo contrast 03/08/2019:   1. No acute intracranial abnormality. 2. Cerebral atrophy primarily affecting the temporal lobes.  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  Behavior has significant improved with SSRI.  She continues to have inappropriate laughter and tends to hit her husband at times, but he says that he is redirect her and when she is not around other people and in familiar surrounding, behavior is much better.   - Continue rivastigmine 1.'5mg'$  BID - refilled  - Continue sertraline '25mg'$  daily - refilled  - No home safety issues   Return to clinic in 1 year  Thank you for allowing me to participate in patient's care.  If I can answer any additional questions, I would be pleased to do so.     Sincerely,    Shoua Ressler K. Posey Pronto, DO

## 2021-05-14 NOTE — Patient Instructions (Signed)
Return to clinic in 1 year.  We will schedule a video visit.

## 2021-06-19 ENCOUNTER — Inpatient Hospital Stay: Payer: BC Managed Care – PPO | Attending: Internal Medicine

## 2021-06-19 ENCOUNTER — Other Ambulatory Visit: Payer: Self-pay

## 2021-06-19 DIAGNOSIS — E119 Type 2 diabetes mellitus without complications: Secondary | ICD-10-CM | POA: Diagnosis not present

## 2021-06-19 DIAGNOSIS — Z79899 Other long term (current) drug therapy: Secondary | ICD-10-CM | POA: Diagnosis not present

## 2021-06-19 DIAGNOSIS — C9000 Multiple myeloma not having achieved remission: Secondary | ICD-10-CM | POA: Diagnosis not present

## 2021-06-19 DIAGNOSIS — Z9484 Stem cells transplant status: Secondary | ICD-10-CM | POA: Insufficient documentation

## 2021-06-19 DIAGNOSIS — E7800 Pure hypercholesterolemia, unspecified: Secondary | ICD-10-CM | POA: Insufficient documentation

## 2021-06-19 DIAGNOSIS — C9001 Multiple myeloma in remission: Secondary | ICD-10-CM

## 2021-06-19 LAB — CMP (CANCER CENTER ONLY)
ALT: 72 U/L — ABNORMAL HIGH (ref 0–44)
AST: 47 U/L — ABNORMAL HIGH (ref 15–41)
Albumin: 3.6 g/dL (ref 3.5–5.0)
Alkaline Phosphatase: 72 U/L (ref 38–126)
Anion gap: 8 (ref 5–15)
BUN: 19 mg/dL (ref 8–23)
CO2: 25 mmol/L (ref 22–32)
Calcium: 9.6 mg/dL (ref 8.9–10.3)
Chloride: 105 mmol/L (ref 98–111)
Creatinine: 0.88 mg/dL (ref 0.44–1.00)
GFR, Estimated: >60 mL/min (ref >60)
Glucose, Bld: 107 mg/dL — ABNORMAL HIGH (ref 70–99)
Potassium: 3.8 mmol/L (ref 3.5–5.1)
Sodium: 138 mmol/L (ref 135–145)
Total Bilirubin: 0.8 mg/dL (ref 0.3–1.2)
Total Protein: 7.9 g/dL (ref 6.5–8.1)

## 2021-06-19 LAB — CBC WITH DIFFERENTIAL (CANCER CENTER ONLY)
Abs Immature Granulocytes: 0.01 10*3/uL (ref 0.00–0.07)
Basophils Absolute: 0.1 10*3/uL (ref 0.0–0.1)
Basophils Relative: 1 %
Eosinophils Absolute: 0.2 10*3/uL (ref 0.0–0.5)
Eosinophils Relative: 3 %
HCT: 39.8 % (ref 36.0–46.0)
Hemoglobin: 13 g/dL (ref 12.0–15.0)
Immature Granulocytes: 0 %
Lymphocytes Relative: 39 %
Lymphs Abs: 2.5 10*3/uL (ref 0.7–4.0)
MCH: 28.4 pg (ref 26.0–34.0)
MCHC: 32.7 g/dL (ref 30.0–36.0)
MCV: 86.9 fL (ref 80.0–100.0)
Monocytes Absolute: 0.5 10*3/uL (ref 0.1–1.0)
Monocytes Relative: 8 %
Neutro Abs: 3.2 10*3/uL (ref 1.7–7.7)
Neutrophils Relative %: 49 %
Platelet Count: 187 10*3/uL (ref 150–400)
RBC: 4.58 MIL/uL (ref 3.87–5.11)
RDW: 14 % (ref 11.5–15.5)
WBC Count: 6.5 10*3/uL (ref 4.0–10.5)
nRBC: 0 % (ref 0.0–0.2)

## 2021-06-19 LAB — LACTATE DEHYDROGENASE: LDH: 145 U/L (ref 98–192)

## 2021-06-20 LAB — KAPPA/LAMBDA LIGHT CHAINS
Kappa free light chain: 39.6 mg/L — ABNORMAL HIGH (ref 3.3–19.4)
Kappa, lambda light chain ratio: 1.41 (ref 0.26–1.65)
Lambda free light chains: 28.1 mg/L — ABNORMAL HIGH (ref 5.7–26.3)

## 2021-06-20 LAB — BETA 2 MICROGLOBULIN, SERUM: Beta-2 Microglobulin: 1.5 mg/L (ref 0.6–2.4)

## 2021-06-21 LAB — IGG, IGA, IGM
IgA: 525 mg/dL — ABNORMAL HIGH (ref 87–352)
IgG (Immunoglobin G), Serum: 1739 mg/dL — ABNORMAL HIGH (ref 586–1602)
IgM (Immunoglobulin M), Srm: 42 mg/dL (ref 26–217)

## 2021-06-23 ENCOUNTER — Other Ambulatory Visit: Payer: Self-pay | Admitting: Family Medicine

## 2021-06-26 ENCOUNTER — Other Ambulatory Visit: Payer: Self-pay

## 2021-06-26 ENCOUNTER — Inpatient Hospital Stay (HOSPITAL_BASED_OUTPATIENT_CLINIC_OR_DEPARTMENT_OTHER): Payer: BC Managed Care – PPO | Admitting: Internal Medicine

## 2021-06-26 VITALS — BP 107/76 | HR 103 | Temp 97.0°F | Resp 20 | Ht 68.0 in | Wt 156.6 lb

## 2021-06-26 DIAGNOSIS — C9001 Multiple myeloma in remission: Secondary | ICD-10-CM | POA: Diagnosis not present

## 2021-06-26 DIAGNOSIS — Z9484 Stem cells transplant status: Secondary | ICD-10-CM | POA: Diagnosis not present

## 2021-06-26 DIAGNOSIS — E7800 Pure hypercholesterolemia, unspecified: Secondary | ICD-10-CM | POA: Diagnosis not present

## 2021-06-26 DIAGNOSIS — C9000 Multiple myeloma not having achieved remission: Secondary | ICD-10-CM | POA: Diagnosis not present

## 2021-06-26 DIAGNOSIS — E119 Type 2 diabetes mellitus without complications: Secondary | ICD-10-CM | POA: Diagnosis not present

## 2021-06-26 DIAGNOSIS — Z79899 Other long term (current) drug therapy: Secondary | ICD-10-CM | POA: Diagnosis not present

## 2021-06-26 NOTE — Progress Notes (Signed)
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  PRIOR THERAPY: 1) Systemic chemotherapy with Carfilzomib, Cytoxan and dexamethasone. First dose on 11/15/2013. Status post 5 cycles. 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 3) Maintenance Revlimid 10 mg by mouth daily. Status post 2 months of treatment. 4) Maintenance Revlimid 15 mg by mouth daily. Status post 4 months of treatment. 5) Maintenance treatment with Revlimid 10 mg by mouth daily status post 17 months. She completed 2 years of treatment in February 2018.  CURRENT THERAPY: Observation.  INTERVAL HISTORY: Victoria Gates 70 y.o. female returns to the clinic today for 76-monthfollow-up visit accompanied by her husband.  The patient is feeling fine today with no concerning complaints.  She denied having any current chest pain, shortness of breath, cough or hemoptysis.  She denied having any fever or chills.  She has no nausea, vomiting, diarrhea or constipation.  She denied having any headache or visual changes.  She has no recent weight loss or night sweats.  She has repeat myeloma panel performed recently and she is here for evaluation and discussion of her lab results.  MEDICAL HISTORY: Past Medical History:  Diagnosis Date   Allergic rhinitis 01/02/2009   Bradycardia 04/29/2016   Cervical Radiculopathy 02/01/2010   Left side   Chest pain 04/15/2012   Ex MV: Ex time 10 mins, EF 63%, no ischemia/infarct. Occas PAC's/PVC's.   Diabetes mellitus, type II 03/18/2008   Encounter for antineoplastic chemotherapy 04/29/2016   Esophageal Stricture 04/15/2007   s/p dilitation   GERD (gastroesophageal reflux disease) 09/21/2010   Herpes zoster 11/05/2010   Hiatal Hernia 04/15/2007   History of colonic  polyps 04/24/2007   Hyperplastic only   Hypercholesterolemia 07/03/2009   Hypokalemia 11/30/2015   Iron deficiency anemia 04/24/2007   [place] 02/2014   Osteoporosis 04/24/2007    ALLERGIES:  is allergic to atorvastatin, rosuvastatin, and sulfonamide derivatives.  MEDICATIONS:  Current Outpatient Medications  Medication Sig Dispense Refill   amoxicillin (AMOXIL) 875 MG tablet Take 875 mg by mouth 2 (two) times daily.     Ascorbic Acid (VITAMIN C) 1000 MG tablet Take 1,000 mg by mouth daily.      Calcium 500-125 MG-UNIT TABS Take 1 tablet by mouth daily.     cholecalciferol (VITAMIN D) 1000 UNITS tablet Take 1,000 Units by mouth daily.     ibuprofen (ADVIL) 600 MG tablet Take 600 mg by mouth every 6 (six) hours as needed.     lansoprazole (PREVACID) 30 MG capsule Take 30 mg by mouth daily with breakfast.      Pitavastatin Calcium (LIVALO) 2 MG TABS TAKE 1 TABLET BY MOUTH EVERY DAY 30 tablet 0   Potassium Chloride ER 20 MEQ TBCR TAKE 1 TABLET BY MOUTH EVERY DAY 90 tablet 1   rivastigmine (EXELON) 1.5 MG capsule Take 1 capsule (1.5 mg total) by mouth 2 (two) times daily. 180 capsule 3   sertraline (ZOLOFT) 25 MG tablet Take 1 tablet (25 mg total) by mouth daily. 90 tablet 3   No current facility-administered medications for this visit.    SURGICAL HISTORY:  Past Surgical History:  Procedure Laterality Date   ESOPHAGOGASTRODUODENOSCOPY  04/15/2007   TUBAL LIGATION      REVIEW OF SYSTEMS:  A comprehensive review of systems was negative.   PHYSICAL EXAMINATION: General appearance: alert, cooperative, and no distress Head: Normocephalic, without obvious abnormality, atraumatic Neck: no adenopathy, no JVD, supple, symmetrical, trachea midline, and thyroid not enlarged, symmetric, no tenderness/mass/nodules Lymph nodes: Cervical, supraclavicular, and axillary nodes normal. Resp: clear to auscultation bilaterally Back: symmetric, no curvature. ROM normal. No CVA  tenderness. Cardio: regular rate and rhythm, S1, S2 normal, no murmur, click, rub or gallop GI: soft, non-tender; bowel sounds normal; no masses,  no organomegaly Extremities: extremities normal, atraumatic, no cyanosis or edema  ECOG PERFORMANCE STATUS: 1 - Symptomatic but completely ambulatory  Blood pressure 107/76, pulse (!) 103, temperature (!) 97 F (36.1 C), temperature source Tympanic, resp. rate 20, height _0  (1.727 m), weight 156 lb 9.6 oz (71 kg), SpO2 100 %.  LABORATORY DATA: Lab Results  Component Value Date   WBC 6.5 06/19/2021   HGB 13.0 06/19/2021   HCT 39.8 06/19/2021   MCV 86.9 06/19/2021   PLT 187 06/19/2021      Chemistry      Component Value Date/Time   NA 138 06/19/2021 0826   NA 139 08/13/2017 0816   K 3.8 06/19/2021 0826   K 3.2 (L) 08/13/2017 0816   CL 105 06/19/2021 0826   CO2 25 06/19/2021 0826   CO2 26 08/13/2017 0816   BUN 19 06/19/2021 0826   BUN 9.0 08/13/2017 0816   CREATININE 0.88 06/19/2021 0826   CREATININE 1.04 (H) 07/03/2020 0843   CREATININE 1.0 08/13/2017 0816      Component Value Date/Time   CALCIUM 9.6 06/19/2021 0826   CALCIUM 9.2 08/13/2017 0816   ALKPHOS 72 06/19/2021 0826   ALKPHOS 59 08/13/2017 0816   AST 47 (H) 06/19/2021 0826   AST 22 08/13/2017 0816   ALT 72 (H) 06/19/2021 0826   ALT 17 08/13/2017 0816   BILITOT 0.8 06/19/2021 0826   BILITOT 0.80 08/13/2017 0816       RADIOGRAPHIC STUDIES: No results found.  ASSESSMENT AND PLAN:  This is a very pleasant 70 years old African-American female with multiple myeloma, IgA subtype status post induction systemic chemotherapy with Carfilzomib, Cytoxan and dexamethasone followed by peripheral blood autologous stem cell transplant followed by 2 years maintenance of treatment with Revlimid. The patient has been in observation for several years now and doing fine with no concerning complaints. Repeat myeloma panel showed mild increase in the IgG as well as the free kappa  light chain.  I discussed the lab results with the patient and her husband and recommended for her to have repeat myeloma panel in 3 months for further evaluation of her disease and to rule out any further progression. The patient and her husband agreed to the current plan. She was advised to call immediately if she has any other concerning symptoms in the interval. The patient voices understanding of current disease status and treatment options and is in agreement with the current care plan. All questions were answered. The patient knows to call the clinic with any problems, questions or concerns. We can certainly see the patient much sooner if necessary.  Disclaimer: This note was dictated with voice recognition software. Similar sounding words can inadvertently be transcribed and may not be corrected upon review.

## 2021-07-04 ENCOUNTER — Emergency Department (HOSPITAL_COMMUNITY)
Admission: EM | Admit: 2021-07-04 | Discharge: 2021-07-04 | Disposition: A | Discharge status: Home / Self Care | Payer: BC Managed Care – PPO | Attending: Emergency Medicine | Admitting: Emergency Medicine

## 2021-07-04 DIAGNOSIS — Z743 Need for continuous supervision: Secondary | ICD-10-CM | POA: Diagnosis not present

## 2021-07-04 DIAGNOSIS — I10 Essential (primary) hypertension: Secondary | ICD-10-CM | POA: Insufficient documentation

## 2021-07-04 DIAGNOSIS — R4182 Altered mental status, unspecified: Secondary | ICD-10-CM | POA: Diagnosis not present

## 2021-07-04 DIAGNOSIS — Z79899 Other long term (current) drug therapy: Secondary | ICD-10-CM | POA: Insufficient documentation

## 2021-07-04 DIAGNOSIS — F0390 Unspecified dementia without behavioral disturbance: Secondary | ICD-10-CM | POA: Insufficient documentation

## 2021-07-04 DIAGNOSIS — E119 Type 2 diabetes mellitus without complications: Secondary | ICD-10-CM | POA: Diagnosis not present

## 2021-07-04 DIAGNOSIS — R404 Transient alteration of awareness: Secondary | ICD-10-CM | POA: Diagnosis not present

## 2021-07-04 NOTE — Care Management (Signed)
ED RNCM received consult concerning patient and family needing assistance with care when husband is at work. Spoke with patient's son concerning possible option with [facility]. ED RNCM  provided information for [facility] referral will be forwarded to [facility].  Updated EDP no further ED RN CM needs identified.

## 2021-07-04 NOTE — ED Notes (Addendum)
Patient husband at bedside, states patient is at mental baseline.

## 2021-07-04 NOTE — ED Provider Notes (Signed)
[facility] EMERGENCY DEPARTMENT Provider Note   CSN: [id number] Arrival date & time: 07/04/21  2034     History Chief Complaint  Patient presents with   Altered Mental Status    Victoria Gates is a 70 y.o. female presenting to the ED for mental evaluation.  Level 5 caveat due to dementia.  History obtained from patient's husband and son via the phone.  It appears patient left the house while the alarm is on, triggering the alarm.  Police showed up, patient was found wandering the streets with an abnormal affect.  As such, she was brought to the ER.  Per husband, patient is at baseline mental status.  She has a history of dementia.  There has been no concerns for infection including fever, cough, urinary symptoms recently.  There is nobody staying with the patient while husband is at work.  HPI     Past Medical History:  Diagnosis Date   Allergic rhinitis 01/02/2009   Bradycardia 04/29/2016   Cervical Radiculopathy 02/01/2010   Left side   Chest pain 04/15/2012   Ex MV: Ex time 10 mins, EF 63%, no ischemia/infarct. Occas PAC's/PVC's.   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   Pre-op examination 04/03/2021   Hyperlipidemia 07/03/2020   Mild neurocognitive disorder, 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   Bradycardia 04/29/2016   Hypokalemia 11/30/2015   Neutropenia ([place]) 05/03/2015   Syncope 09/11/2014    Bronchitis 09/08/2014   Multiple myeloma ([place]) 10/22/2013   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   Hyperproteinemia 04/14/2013   Midsternal chest pain 04/20/2012   Lipoma of shoulder 11/02/2011   Herpes zoster 11/05/2010   GERD (gastroesophageal reflux disease) 09/21/2010   Cervical Radiculopathy, Left 02/01/2010   Hypercholesterolemia 07/03/2009   Otitis Externa 07/03/2009   Cerumen impaction 07/03/2009   Myalgia 07/03/2009   Allergic rhinitis 01/02/2009   Diabetes ([place]) 03/18/2008   Chest pain 12/24/2007   URI 12/12/2007   Anemia-Iron Deficiency 04/24/2007   Essential hypertension 04/24/2007   Osteoporosis 04/24/2007   Esophageal Stricture 04/15/2007   Hiatal Hernia 04/15/2007    Past Surgical History:  Procedure Laterality Date   ESOPHAGOGASTRODUODENOSCOPY  04/15/2007   TUBAL LIGATION       OB History   No obstetric history on file.     Family History  Problem Relation Age of Onset   Brain cancer Mother    Heart disease Mother    Lung cancer Father    Heart disease Father    Diabetes Sister    Heart disease Sister    Diabetes Brother    Heart disease Brother    Cancer Neg Hx        No FH of Colon Cancer   Colon cancer Neg Hx    Esophageal cancer Neg Hx    Rectal cancer Neg Hx    Stomach cancer Neg Hx     Social History   Tobacco Use   Smoking status: Never   Smokeless  tobacco: Never  Vaping Use   Vaping Use: Never used  Substance Use Topics   Alcohol use: No   Drug use: No    Home Medications Prior to Admission medications   Medication Sig Start Date End Date Taking? Authorizing Provider  amoxicillin (AMOXIL) 875 MG tablet Take 875 mg by mouth 2 (two) times daily. 05/11/21   [provider]  Ascorbic Acid (VITAMIN C) 1000 MG tablet Take 1,000 mg by mouth daily.     [provider]  Calcium 500-125 MG-UNIT TABS Take 1 tablet by mouth daily.    [provider]  cholecalciferol (VITAMIN D) 1000 UNITS tablet Take 1,000  Units by mouth daily.    [provider]  ibuprofen (ADVIL) 600 MG tablet Take 600 mg by mouth every 6 (six) hours as needed. 05/11/21   [provider]  lansoprazole (PREVACID) 30 MG capsule Take 30 mg by mouth daily with breakfast.     [provider]  Pitavastatin Calcium (LIVALO) 2 MG TABS TAKE 1 TABLET BY MOUTH EVERY DAY 06/25/21   Carollee Herter, Kendrick Fries R, DO  Potassium Chloride ER 20 MEQ TBCR TAKE 1 TABLET BY MOUTH EVERY DAY 04/26/21   Carollee Herter, Alferd Apa, DO  rivastigmine (EXELON) 1.5 MG capsule Take 1 capsule (1.5 mg total) by mouth 2 (two) times daily. 05/14/21   Narda Amber K, DO  sertraline (ZOLOFT) 25 MG tablet Take 1 tablet (25 mg total) by mouth daily. 05/14/21   Narda Amber K, DO    Allergies    Atorvastatin, Rosuvastatin, and Sulfonamide derivatives  Review of Systems   Review of Systems  Unable to perform ROS: Dementia  Psychiatric/Behavioral:  Positive for confusion.    Physical Exam Updated Vital Signs BP 116/62   Pulse 92   Temp 97.9 F (36.6 C) (Oral)   Resp 14   SpO2 100%   Physical Exam Vitals and nursing note reviewed.  Constitutional:      General: She is not in acute distress.    Appearance: Normal appearance.  HENT:     Head: Normocephalic and atraumatic.  Eyes:     Conjunctiva/sclera: Conjunctivae normal.     Pupils: Pupils are equal, round, and reactive to light.  Cardiovascular:     Rate and Rhythm: Normal rate and regular rhythm.     Pulses: Normal pulses.  Pulmonary:     Effort: Pulmonary effort is normal. No respiratory distress.     Breath sounds: Normal breath sounds. No wheezing.     Comments: Speaking in full sentences.  Clear lung sounds in all fields. Abdominal:     General: There is no distension.     Palpations: Abdomen is soft. There is no mass.     Tenderness: There is no abdominal tenderness. There is no guarding or rebound.  Musculoskeletal:        General: Normal range of motion.     Cervical  back: Normal range of motion and neck supple.  Skin:    General: Skin is warm and dry.     Capillary Refill: Capillary refill takes less than 2 seconds.  Neurological:     Mental Status: She is alert. Mental status is at baseline.     Comments: At baseline per husband.  Frequent laughter, baseline per chart review.  Easily redirectable  Psychiatric:        Speech: Speech normal.    ED Results / Procedures / Treatments   Labs (all labs ordered are listed, but  only abnormal results are displayed) Labs Reviewed - No data to display  EKG None  Radiology No results found.  Procedures Procedures   Medications Ordered in ED Medications - No data to display  ED Course  I have reviewed the triage vital signs and the nursing notes.  Pertinent labs & imaging results that were available during my care of the patient were reviewed by me and considered in my medical decision making (see chart for details).  Clinical Course as of 07/04/21 2116  Wed Jul 04, 2021  2110 70 yo female w/ vascular dementia presenting by EMS after being found wandering outdoors tonight.  EMS could not contact her family and brought her to the ED.  PA provider here was able to reach family members, husband present to pick up patient.  This appears to be her baseline mental state.  No evidence of head trauma, vitals unremarkable here.  Okay for discharge. [MT]    Clinical Course User Index [MT] Trifan, Carola Rhine, MD   MDM Rules/Calculators/A&P                           Patient presenting for mental evaluation.  On exam, patient appears nontoxic.  Per husband, patient is at baseline mental status.  It appears she left the house alone, and this was found wandering the streets.  However no concerns for infection.  No signs of injury.  As patient does not have any acute abnormalities or concerns, husband is agreeable with not obtaining any work-up including labs or urine.  I discussed with social work, unfortunately  due to [employer] there is no home health option that will be covered.  Discussed with patient's husband.  Encourage patient's husband to reach out to PCP and to look into babysitter/nanny options.  At this time, patient appears safe for discharge.  Return precautions given.  Patient's husband states he understands and agrees to plan  Final Clinical Impression(s) / ED Diagnoses Final diagnoses:  Dementia, unspecified dementia severity, unspecified dementia type, unspecified whether behavioral, psychotic, or mood disturbance or anxiety [facility])    Rx / DC Orders ED Discharge Orders     None        Franchot Heidelberg, PA-C 07/04/21 2119    Wyvonnia Dusky, MD 07/04/21 2239

## 2021-07-04 NOTE — ED Triage Notes (Addendum)
Patient found wandering in street after PD was called due to her home burglery alarm going off.Patient A&Ox1, per baseline. NAD noted.  Per EMS, PD and EMS were unable to get in contact with any family members

## 2021-07-04 NOTE — ED Notes (Signed)
Victoria Gates [phone] would like an update

## 2021-07-09 ENCOUNTER — Telehealth: Payer: Self-pay | Admitting: Neurology

## 2021-07-09 DIAGNOSIS — G3109 Other frontotemporal dementia: Secondary | ICD-10-CM

## 2021-07-09 DIAGNOSIS — F0280 Dementia in other diseases classified elsewhere without behavioral disturbance: Secondary | ICD-10-CM

## 2021-07-09 NOTE — Telephone Encounter (Signed)
Spoke to patients son Dr. Clotilde Dieter and he is requesting a home health referral to help patient. He states patient needs more skilled care and has been holding food in her mouth. Patients son thinks Occupational therapy would be beneficial. Informed patients son that I would send this message to Dr. Posey Pronto and give him a call back as soon I hear something.   I asked patients son if he had a home health nurse come to the house today and he stated no that patient does not have any home health services yet and that it was just a visit with a nurse.

## 2021-07-09 NOTE — Telephone Encounter (Signed)
We can send home health referral for PT/OT/nursing care (medication management)/ social work (dementia resources)

## 2021-07-09 NOTE — Telephone Encounter (Signed)
Patient son Dr Clotilde Dieter called and states that the patient is not doing good and they would like to speak to someone about get in home health care for patient, he would like a call back before 11:00 AM they have a nurse coming out to look at her at that time.   Please call

## 2021-07-09 NOTE — Telephone Encounter (Signed)
Can we find out which home health agency is sending a nurse?  We can request the same home health social worker to provide family for home health caregiver support and assistance.  Thanks.

## 2021-07-10 NOTE — Telephone Encounter (Signed)
Called patients son and informed him that we will be sending a referral for PT/OT/Nursing care/social work. Patients son verbalized understanding and had no further questions or concerns.

## 2021-07-12 ENCOUNTER — Ambulatory Visit (INDEPENDENT_AMBULATORY_CARE_PROVIDER_SITE_OTHER): Payer: BC Managed Care – PPO | Admitting: Family Medicine

## 2021-07-12 ENCOUNTER — Other Ambulatory Visit: Payer: Self-pay

## 2021-07-12 ENCOUNTER — Encounter: Payer: Self-pay | Admitting: Family Medicine

## 2021-07-12 VITALS — BP 118/60 | HR 83 | Resp 18 | Ht 68.0 in | Wt 153.6 lb

## 2021-07-12 DIAGNOSIS — E119 Type 2 diabetes mellitus without complications: Secondary | ICD-10-CM | POA: Diagnosis not present

## 2021-07-12 DIAGNOSIS — F0280 Dementia in other diseases classified elsewhere without behavioral disturbance: Secondary | ICD-10-CM

## 2021-07-12 DIAGNOSIS — Z0000 Encounter for general adult medical examination without abnormal findings: Secondary | ICD-10-CM

## 2021-07-12 DIAGNOSIS — Z23 Encounter for immunization: Secondary | ICD-10-CM

## 2021-07-12 DIAGNOSIS — I10 Essential (primary) hypertension: Secondary | ICD-10-CM

## 2021-07-12 DIAGNOSIS — G3109 Other frontotemporal dementia: Secondary | ICD-10-CM | POA: Diagnosis not present

## 2021-07-12 DIAGNOSIS — E785 Hyperlipidemia, unspecified: Secondary | ICD-10-CM

## 2021-07-12 DIAGNOSIS — E1169 Type 2 diabetes mellitus with other specified complication: Secondary | ICD-10-CM | POA: Insufficient documentation

## 2021-07-12 NOTE — Assessment & Plan Note (Signed)
Well controlled, no changes to meds. Encouraged heart healthy diet such as the DASH diet and exercise as tolerated.  °

## 2021-07-12 NOTE — Patient Instructions (Signed)
Preventive Care 40 Years and Older, Female Preventive care refers to lifestyle choices and visits with your health care provider that can promote health and wellness. This includes: A yearly physical exam. This is also called an annual wellness visit. Regular dental and eye exams. Immunizations. Screening for certain conditions. Healthy lifestyle choices, such as: Eating a healthy diet. Getting regular exercise. Not using drugs or products that contain nicotine and tobacco. Limiting alcohol use. What can I expect for my preventive care visit? Physical exam Your health care provider will check your: Height and weight. These may be used to calculate your BMI (body mass index). BMI is a measurement that tells if you are at a healthy weight. Heart rate and blood pressure. Body temperature. Skin for abnormal spots. Counseling Your health care provider may ask you questions about your: Past medical problems. Family's medical history. Alcohol, tobacco, and drug use. Emotional well-being. Home life and relationship well-being. Sexual activity. Diet, exercise, and sleep habits. History of falls. Memory and ability to understand (cognition). Work and work Statistician. Pregnancy and menstrual history. Access to firearms. What immunizations do I need? Vaccines are usually given at various ages, according to a schedule. Your health care provider will recommend vaccines for you based on your age, medical history, and lifestyle or other factors, such as travel or where you work. What tests do I need? Blood tests Lipid and cholesterol levels. These may be checked every 5 years, or more often depending on your overall health. Hepatitis C test. Hepatitis B test. Screening Lung cancer screening. You may have this screening every year starting at age 33 if you have a 30-pack-year history of smoking and currently smoke or have quit within the past 15 years. Colorectal cancer screening. All  adults should have this screening starting at age 73 and continuing until age 9. Your health care provider may recommend screening at age 31 if you are at increased risk. You will have tests every 1-10 years, depending on your results and the type of screening test. Diabetes screening. This is done by checking your blood sugar (glucose) after you have not eaten for a while (fasting). You may have this done every 1-3 years. Mammogram. This may be done every 1-2 years. Talk with your health care provider about how often you should have regular mammograms. Abdominal aortic aneurysm (AAA) screening. You may need this if you are a current or former smoker. BRCA-related cancer screening. This may be done if you have a family history of breast, ovarian, tubal, or peritoneal cancers. Other tests STD (sexually transmitted disease) testing, if you are at risk. Bone density scan. This is done to screen for osteoporosis. You may have this done starting at age 43. Talk with your health care provider about your test results, treatment options, and if necessary, the need for more tests. Follow these instructions at home: Eating and drinking  Eat a diet that includes fresh fruits and vegetables, whole grains, lean protein, and low-fat dairy products. Limit your intake of foods with high amounts of sugar, saturated fats, and salt. Take vitamin and mineral supplements as recommended by your health care provider. Do not drink alcohol if your health care provider tells you not to drink. If you drink alcohol: Limit how much you have to 0-1 drink a day. Be aware of how much alcohol is in your drink. In the [place] one drink equals one 12 oz bottle of beer (355 mL), one 5 oz glass of wine (148 mL), or one  1 oz glass of hard liquor (44 mL). Lifestyle Take daily care of your teeth and gums. Brush your teeth every morning and night with fluoride toothpaste. Floss one time each day. Stay active. Exercise for at least  30 minutes 5 or more days each week. Do not use any products that contain nicotine or tobacco, such as cigarettes, e-cigarettes, and chewing tobacco. If you need help quitting, ask your health care provider. Do not use drugs. If you are sexually active, practice safe sex. Use a condom or other form of protection in order to prevent STIs (sexually transmitted infections). Talk with your health care provider about taking a low-dose aspirin or statin. Find healthy ways to cope with stress, such as: Meditation, yoga, or listening to music. Journaling. Talking to a trusted person. Spending time with friends and family. Safety Always wear your seat belt while driving or riding in a vehicle. Do not drive: If you have been drinking alcohol. Do not ride with someone who has been drinking. When you are tired or distracted. While texting. Wear a helmet and other protective equipment during sports activities. If you have firearms in your house, make sure you follow all gun safety procedures. What's next? Visit your health care provider once a year for an annual wellness visit. Ask your health care provider how often you should have your eyes and teeth checked. Stay up to date on all vaccines. This information is not intended to replace advice given to you by your health care provider. Make sure you discuss any questions you have with your health care provider. Document Revised: 11/24/2020 Document Reviewed: 09/10/2018 Elsevier Patient Education  2022 [employer].

## 2021-07-12 NOTE — Assessment & Plan Note (Signed)
Per neuro 

## 2021-07-12 NOTE — Progress Notes (Signed)
Subjective:   By signing my name below, I, Shehryar Baig, attest that this documentation has been prepared under the direction and in the presence of Dr. Seabron Spates, DO. 07/12/2021    Patient ID: Victoria Gates, female    DOB: 1951/02/18, 70 y.o.   MRN: [id number]  Chief Complaint  Patient presents with   Annual Exam    Pt states fasting     HPI Patient is in today for a comprehensive physical exam. Her husband is present with her during this visit. She has a history of dementia which requires the husband to assist her.  Her husband reports they have reached out to home health services.  She denies having any fever, new moles, congestion, sore throat, muscle pain, joint pain, chest pain, cough, SOB, wheezing, n/v/d, constipation, blood in stool, dysuria, frequency, hematuria, or headaches at this time.  Her husband reports her younger sister passed away from lack of nutrition and heart attack. He notes she was depressed and refused to eat anything which contributed to her heart attack.  She is UTD on dental care. She has an upcomming appointment for vision care.  She is receiving a flu vaccine during this visit. She has 4 moderna Covid-19 vaccines at this time. Her husband was informed of the new bivalent Covid-19 vaccine during this visit.    Past Medical History:  Diagnosis Date   Allergic rhinitis 01/02/2009   Bradycardia 04/29/2016   Cervical Radiculopathy 02/01/2010   Left side   Chest pain 04/15/2012   Ex MV: Ex time 10 mins, EF 63%, no ischemia/infarct. Occas PAC's/PVC's.   Diabetes mellitus, type II 03/18/2008   Encounter for antineoplastic chemotherapy 04/29/2016   Esophageal Stricture 04/15/2007   s/p dilitation   GERD (gastroesophageal reflux disease) 09/21/2010   Herpes zoster 11/05/2010   Hiatal Hernia 04/15/2007   History of colonic polyps 04/24/2007   Hyperplastic only   Hypercholesterolemia 07/03/2009   Hypokalemia 11/30/2015   Iron deficiency anemia  04/24/2007   Multiple myeloma (HCC) 02/2014   Osteoporosis 04/24/2007    Past Surgical History:  Procedure Laterality Date   ESOPHAGOGASTRODUODENOSCOPY  04/15/2007   TUBAL LIGATION      Family History  Problem Relation Age of Onset   Brain cancer Mother    Heart disease Mother    Lung cancer Father    Heart disease Father    Diabetes Sister    Heart disease Sister    Heart attack Sister    ALS Sister    Diabetes Brother    Heart disease Brother    Cancer Neg Hx        No FH of Colon Cancer   Colon cancer Neg Hx    Esophageal cancer Neg Hx    Rectal cancer Neg Hx    Stomach cancer Neg Hx     Social History   Socioeconomic History   Marital status: Married    Spouse name: Not on file   Number of children: Not on file   Years of education: Not on file   Highest education level: Not on file  Occupational History   Occupation: MAIL Marine scientist: [address]  Tobacco Use   Smoking status: Never   Smokeless tobacco: Never  Vaping Use   Vaping Use: Never used  Substance and Sexual Activity   Alcohol use: No   Drug use: No   Sexual activity: Yes  Other Topics Concern   Not on file  Social History  Narrative   Lives in [place] with spouse.   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   Right Handed   Social Determinants of Health   Financial Resource Strain: Not on file  Food Insecurity: Not on file  Transportation Needs: Not on file  Physical Activity: Not on file  Stress: Not on file  Social Connections: Not on file  Intimate Partner Violence: Not on file    Outpatient Medications Prior to Visit  Medication Sig Dispense Refill   Ascorbic Acid (VITAMIN C) 1000 MG tablet Take 1,000 mg by mouth daily.      Calcium 500-125 MG-UNIT TABS Take 1 tablet by mouth daily.     cholecalciferol (VITAMIN D) 1000 UNITS tablet Take 1,000 Units by mouth daily.     ibuprofen (ADVIL) 600 MG tablet Take 600 mg by mouth every 6 (six) hours as needed.      lansoprazole (PREVACID) 30 MG capsule Take 30 mg by mouth daily with breakfast.      Pitavastatin Calcium (LIVALO) 2 MG TABS TAKE 1 TABLET BY MOUTH EVERY DAY 30 tablet 0   Potassium Chloride ER 20 MEQ TBCR TAKE 1 TABLET BY MOUTH EVERY DAY 90 tablet 1   rivastigmine (EXELON) 1.5 MG capsule Take 1 capsule (1.5 mg total) by mouth 2 (two) times daily. 180 capsule 3   sertraline (ZOLOFT) 25 MG tablet Take 1 tablet (25 mg total) by mouth daily. 90 tablet 3   amoxicillin (AMOXIL) 875 MG tablet Take 875 mg by mouth 2 (two) times daily. (Patient not taking: Reported on 07/12/2021)     No facility-administered medications prior to visit.    Allergies  Allergen Reactions   Atorvastatin     REACTION: myalgias   Rosuvastatin     REACTION: myalgias   Sulfonamide Derivatives     Unknown reaction     Review of Systems  Constitutional:  Negative for fever.  HENT:  Negative for congestion and sore throat.   Respiratory:  Negative for cough, shortness of breath and wheezing.   Cardiovascular:  Negative for chest pain.  Gastrointestinal:  Negative for blood in stool, constipation, diarrhea, nausea and vomiting.  Genitourinary:  Negative for dysuria, frequency and hematuria.  Musculoskeletal:  Negative for joint pain and myalgias.  Skin:        (-)New moles  Neurological:  Negative for headaches.      Objective:    Physical Exam Constitutional:      General: She is not in acute distress.    Appearance: Normal appearance. She is not ill-appearing.  HENT:     Head: Normocephalic and atraumatic.     Right Ear: Tympanic membrane, ear canal and external ear normal.     Left Ear: Tympanic membrane, ear canal and external ear normal.  Eyes:     Extraocular Movements: Extraocular movements intact.     Pupils: Pupils are equal, round, and reactive to light.  Cardiovascular:     Rate and Rhythm: Normal rate and regular rhythm.     Heart sounds: Normal heart sounds. No murmur heard.   No gallop.   Pulmonary:     Effort: Pulmonary effort is normal. No respiratory distress.     Breath sounds: Normal breath sounds. No wheezing or rales.  Abdominal:     General: Bowel sounds are normal. There is no distension.     Palpations: Abdomen is soft.     Tenderness: There is no abdominal tenderness. There is no guarding.  Skin:  General: Skin is warm and dry.  Neurological:     Mental Status: She is alert and oriented to person, place, and time.  Psychiatric:        Behavior: Behavior normal.        Judgment: Judgment normal.    BP 118/60 (BP Location: Left Arm, Patient Position: Sitting, Cuff Size: Normal)   Pulse 83   Resp 18   Ht $R'5\' 8"'mR$  (1.727 m)   Wt 153 lb 9.6 oz (69.7 kg)   SpO2 96%   BMI 23.35 kg/m  Wt Readings from Last 3 Encounters:  07/12/21 153 lb 9.6 oz (69.7 kg)  06/26/21 156 lb 9.6 oz (71 kg)  05/14/21 153 lb (69.4 kg)    Diabetic Foot Exam - Simple   No data filed    Lab Results  Component Value Date   WBC 6.5 06/19/2021   HGB 13.0 06/19/2021   HCT 39.8 06/19/2021   PLT 187 06/19/2021   GLUCOSE 107 (H) 06/19/2021   CHOL 101 04/03/2021   TRIG 102.0 04/03/2021   HDL 25.30 (L) 04/03/2021   LDLDIRECT 127.0 10/11/2020   LDLCALC 56 04/03/2021   ALT 72 (H) 06/19/2021   AST 47 (H) 06/19/2021   NA 138 06/19/2021   K 3.8 06/19/2021   CL 105 06/19/2021   CREATININE 0.88 06/19/2021   BUN 19 06/19/2021   CO2 25 06/19/2021   TSH 4.49 12/08/2018   INR 1.22 05/09/2016   HGBA1C 5.9 04/03/2021   MICROALBUR <0.7 09/01/2017    Lab Results  Component Value Date   TSH 4.49 12/08/2018   Lab Results  Component Value Date   WBC 6.5 06/19/2021   HGB 13.0 06/19/2021   HCT 39.8 06/19/2021   MCV 86.9 06/19/2021   PLT 187 06/19/2021   Lab Results  Component Value Date   NA 138 06/19/2021   K 3.8 06/19/2021   CHLORIDE 106 08/13/2017   CO2 25 06/19/2021   GLUCOSE 107 (H) 06/19/2021   BUN 19 06/19/2021   CREATININE 0.88 06/19/2021   BILITOT 0.8 06/19/2021    ALKPHOS 72 06/19/2021   AST 47 (H) 06/19/2021   ALT 72 (H) 06/19/2021   PROT 7.9 06/19/2021   ALBUMIN 3.6 06/19/2021   CALCIUM 9.6 06/19/2021   ANIONGAP 8 06/19/2021   EGFR >60 08/13/2017   GFR 72.46 04/03/2021   Lab Results  Component Value Date   CHOL 101 04/03/2021   Lab Results  Component Value Date   HDL 25.30 (L) 04/03/2021   Lab Results  Component Value Date   LDLCALC 56 04/03/2021   Lab Results  Component Value Date   TRIG 102.0 04/03/2021   Lab Results  Component Value Date   CHOLHDL 4 04/03/2021   Lab Results  Component Value Date   HGBA1C 5.9 04/03/2021   Mammogram- Last completed 10/07/2018. Results showed possible asymmetry in right breast which requires further evaluation.  Dexa- Last completed 06/09/2016. Results showed she has osteoporosis. Repeat in 2 years.  Pap Smear- Last completed 05/17/2013.  Colonoscopy- Last completed 06/10/2012. Results were normal. Repeat in 10 years.      Assessment & Plan:   Problem List Items Addressed This Visit       Unprioritized   Diet-controlled diabetes mellitus ([place])    hgba1c to be checked , minimize simple carbs. Increase exercise as tolerated. Continue current meds      Essential hypertension    Well controlled, no changes to meds. Encouraged heart healthy diet such as  the DASH diet and exercise as tolerated.       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 Increase exercise as tolerated      Preventative health care - Primary    ghm utd Check labs  See avs      Other Visit Diagnoses     Need for influenza vaccination       Relevant Orders   Flu Vaccine QUAD High Dose(Fluad) (Completed)        No orders of the defined types were placed in  this encounter.   I, Dr. Roma Schanz, DO, personally preformed the services described in this documentation.  All medical record entries made by the scribe were at my direction and in my presence.  I have reviewed the chart and discharge instructions (if applicable) and agree that the record reflects my personal performance and is accurate and complete. 07/12/2021   I,Shehryar Baig,acting as a scribe for Ann Held, DO.,have documented all relevant documentation on the behalf of Ann Held, DO,as directed by  Ann Held, DO while in the presence of Ann Held, DO.   Ann Held, DO

## 2021-07-12 NOTE — Assessment & Plan Note (Signed)
ghm utd Check labs  See avs  

## 2021-07-12 NOTE — Progress Notes (Deleted)
Subjective:     Victoria Gates is a 70 y.o. female and is here for a comprehensive physical exam. The patient reports {problems:16946}.  Social History   Socioeconomic History   Marital status: Married    Spouse name: Not on file   Number of children: Not on file   Years of education: Not on file   Highest education level: Not on file  Occupational History   Occupation: MAIL Armed forces operational officer: [address]  Tobacco Use   Smoking status: Never   Smokeless tobacco: Never  Vaping Use   Vaping Use: Never used  Substance and Sexual Activity   Alcohol use: No   Drug use: No   Sexual activity: Yes  Other Topics Concern   Not on file  Social History Narrative   Lives in [place] with spouse.   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   Right Handed   Social Determinants of Health   Financial Resource Strain: Not on file  Food Insecurity: Not on file  Transportation Needs: Not on file  Physical Activity: Not on file  Stress: Not on file  Social Connections: Not on file  Intimate Partner Violence: Not on file   Health Maintenance  Topic Date Due   Zoster Vaccines- Shingrix (1 of 2) Never done   URINE MICROALBUMIN  09/01/2018   COVID-19 Vaccine (3 - Moderna risk series) 01/07/2020   OPHTHALMOLOGY EXAM  03/21/2020   MAMMOGRAM  10/07/2020   INFLUENZA VACCINE  04/30/2021   FOOT EXAM  09/06/2021   HEMOGLOBIN A1C  10/04/2021   COLONOSCOPY (Pts 45-48yrs Insurance coverage will need to be confirmed)  06/10/2022   TETANUS/TDAP  09/02/2027   DEXA SCAN  Completed   Hepatitis C Screening  Completed   HPV VACCINES  Aged Out    {Common ambulatory SmartLinks:19316}  Review of Systems {ros; complete:30496}   Objective:    {Exam, Complete:[phone]}    Assessment:    Healthy female exam. ***     Plan:     See After Visit Summary for Counseling Recommendations

## 2021-07-12 NOTE — Assessment & Plan Note (Signed)
Tolerating statin, encouraged heart healthy diet, avoid trans fats, minimize simple carbs and saturated fats. Increase exercise as tolerated 

## 2021-07-12 NOTE — Assessment & Plan Note (Signed)
hgba1c to be checked, minimize simple carbs. Increase exercise as tolerated. Continue current meds  

## 2021-07-13 ENCOUNTER — Telehealth: Payer: Self-pay | Admitting: *Deleted

## 2021-07-13 NOTE — Chronic Care Management (AMB) (Signed)
  Chronic Care Management   Outreach Note  07/13/2021 Name: Victoria Gates MRN: [id number] DOB: [birth year]  [name] is a 70 y.o. year old female who is a primary care patient of Ann Held, DO. I reached out to Sherlie Ban by phone today in response to a referral sent by [name]'s primary care provider.  An unsuccessful telephone outreach was attempted today. The patient was referred to the case management team for assistance with care management and care coordination.   Follow Up Plan: A HIPAA compliant phone message was left for the patient providing contact information and requesting a return call.  If patient returns call to provider office, please advise to call Embedded Care Management Care Guide Albaraa Swingle at [place], [place] Management  Direct Dial: [phone]

## 2021-07-17 NOTE — Chronic Care Management (AMB) (Signed)
  Chronic Care Management   Outreach Note  07/17/2021 Name: YURITZA PAULHUS MRN: [id number] DOB: 02/27/1951  BETHANN QUALLEY is a 70 y.o. year old female who is a primary care patient of Ann Held, DO. I reached out to Sherlie Ban by phone today in response to a referral sent by Ms. Adora Fridge Gagen's primary care provider.  A second unsuccessful telephone outreach was attempted today. The patient was referred to the case management team for assistance with care management and care coordination.   Follow Up Plan: A HIPAA compliant phone message was left for the patient providing contact information and requesting a return call.  If patient returns call to provider office, please advise to call Embedded Care Management Care Guide Wynee Matarazzo at [place], [place] Management  Direct Dial: [phone]

## 2021-07-18 ENCOUNTER — Other Ambulatory Visit: Payer: Self-pay | Admitting: Nurse Practitioner

## 2021-07-23 NOTE — Chronic Care Management (AMB) (Signed)
  Chronic Care Management   Note  07/23/2021 Name: Victoria Gates MRN: [id number] DOB: [birth year]  [name] is a 70 y.o. year old female who is a primary care patient of Ann Held, DO. I reached out to Sherlie Ban by phone today in response to a referral sent by [name]'s PCP.  [name] was given information about Chronic Care Management services today including:  CCM service includes personalized support from designated clinical staff supervised by her physician, including individualized plan of care and coordination with other care providers 24/7 contact phone numbers for assistance for urgent and routine care needs. Service will only be billed when office clinical staff spend 20 minutes or more in a month to coordinate care. Only one practitioner may furnish and bill the service in a calendar month. The patient may stop CCM services at any time (effective at the end of the month) by phone call to the office staff. The patient is responsible for co-pay (up to 20% after annual deductible is met) if co-pay is required by the individual health plan.   Patient agreed to services and verbal consent obtained.   Follow up plan: Telephone appointment with care management team member scheduled for: 07/27/2021  Julian Hy, [place] Management  Direct Dial: [phone]

## 2021-07-25 ENCOUNTER — Other Ambulatory Visit: Payer: Self-pay | Admitting: Family Medicine

## 2021-07-27 ENCOUNTER — Ambulatory Visit (INDEPENDENT_AMBULATORY_CARE_PROVIDER_SITE_OTHER): Payer: BC Managed Care – PPO

## 2021-07-27 DIAGNOSIS — E785 Hyperlipidemia, unspecified: Secondary | ICD-10-CM

## 2021-07-27 DIAGNOSIS — F0280 Dementia in other diseases classified elsewhere without behavioral disturbance: Secondary | ICD-10-CM

## 2021-07-27 DIAGNOSIS — I10 Essential (primary) hypertension: Secondary | ICD-10-CM

## 2021-07-27 NOTE — Chronic Care Management (AMB) (Signed)
Chronic Care Management   CCM RN Visit Note  07/27/2021 Name: Victoria Gates MRN: [id number] DOB: 14-Jan-1951  Subjective: Victoria Gates is a 70 y.o. year old female who is a primary care patient of Victoria Held, DO. The care management team was consulted for assistance with disease management and care coordination needs.    Engaged with patient by telephone for initial visit in response to provider referral for case management and/or care coordination services.   Consent to Services:  The patient was given the following information about Chronic Care Management services today, agreed to services, and gave verbal consent: 1. CCM service includes personalized support from designated clinical staff supervised by the primary care provider, including individualized plan of care and coordination with other care providers 2. 24/7 contact phone numbers for assistance for urgent and routine care needs. 3. Service will only be billed when office clinical staff spend 20 minutes or more in a month to coordinate care. 4. Only one practitioner may furnish and bill the service in a calendar month. 5.The patient may stop CCM services at any time (effective at the end of the month) by phone call to the office staff. 6. The patient will be responsible for cost sharing (co-pay) of up to 20% of the service fee (after annual deductible is met). Patient agreed to services and consent obtained.  Patient agreed to services and verbal consent obtained.   Assessment: Review of patient past medical history, allergies, medications, health status, including review of consultants reports, laboratory and other test data, was performed as part of comprehensive evaluation and provision of chronic care management services.   SDOH (Social Determinants of Health) assessments and interventions performed:  SDOH Interventions    Flowsheet Row Most Recent Value  SDOH Interventions   Food Insecurity Interventions  Intervention Not Indicated  Transportation Interventions Intervention Not Indicated        CCM Care Plan  Allergies  Allergen Reactions   Atorvastatin     REACTION: myalgias   Rosuvastatin     REACTION: myalgias   Sulfonamide Derivatives     Unknown reaction     Outpatient Encounter Medications as of 07/27/2021  Medication Sig   Ascorbic Acid (VITAMIN C) 1000 MG tablet Take 1,000 mg by mouth daily.    Calcium 500-125 MG-UNIT TABS Take 1 tablet by mouth daily.   cholecalciferol (VITAMIN D) 1000 UNITS tablet Take 1,000 Units by mouth daily.   ibuprofen (ADVIL) 600 MG tablet Take 600 mg by mouth every 6 (six) hours as needed.   lansoprazole (PREVACID) 30 MG capsule Take 30 mg by mouth daily with breakfast.    Pitavastatin Calcium (LIVALO) 2 MG TABS TAKE 1 TABLET BY MOUTH EVERY DAY   Potassium Chloride ER 20 MEQ TBCR TAKE 1 TABLET BY MOUTH EVERY DAY   rivastigmine (EXELON) 1.5 MG capsule Take 1 capsule (1.5 mg total) by mouth 2 (two) times daily.   sertraline (ZOLOFT) 25 MG tablet Take 1 tablet (25 mg total) by mouth daily.   No facility-administered encounter medications on file as of 07/27/2021.    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   Pre-op examination 04/03/2021   Hyperlipidemia 07/03/2020   Mild neurocognitive disorder, 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   Bradycardia 04/29/2016   Hypokalemia 11/30/2015   Neutropenia ([place])  05/03/2015   Syncope 09/11/2014   Bronchitis 09/08/2014   Multiple myeloma ([place]) 10/22/2013   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   Hyperproteinemia 04/14/2013   Midsternal chest pain 04/20/2012   Lipoma of shoulder 11/02/2011   Herpes zoster 11/05/2010   GERD (gastroesophageal reflux disease) 09/21/2010   Cervical Radiculopathy, Left  02/01/2010   Hypercholesterolemia 07/03/2009   Otitis Externa 07/03/2009   Cerumen impaction 07/03/2009   Myalgia 07/03/2009   Allergic rhinitis 01/02/2009   Diet-controlled diabetes mellitus ([place]) 03/18/2008   Chest pain 12/24/2007   URI 12/12/2007   Anemia-Iron Deficiency 04/24/2007   Essential hypertension 04/24/2007   Osteoporosis 04/24/2007   Esophageal Stricture 04/15/2007   Hiatal Hernia 04/15/2007    Conditions to be addressed/monitored:HTN, HLD, and Depression  Care Plan : RN Care Manager Plan of Care  Updates made by Victoria Rued, RN since 07/27/2021 12:00 AM     Problem: No plan of care for Managment of Chronic Condition Dementia in a patient with HTN, HLD   Priority: High     Long-Range Goal: Developement of plan of care for Management of Dementia   Start Date: 07/27/2021  Expected End Date: 10/27/2021  Priority: High  Note:   Current Barriers: Patient with history of HTN, 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 RNCM spoke with patient's husband, primary caregiver and DPR. He reports everything is going fine now. He states Victoria Gates sister is staying with her every day and that someone is with patient all the times. Victoria Gates is managing patient's medications and he states she is taking her medications as prescribed. He also reports she is eating without difficulty and is able to ambulate, bath and dress without difficulty. He states since Victoria Gates's sister is staying with her every day, there are no questions or concern, but is willing to receive Personal care service information for future references. Care Coordination needs related to Lacks knowledge of community resource: for patient's with dementia and personal care services/day programs Chronic Disease Management support and education needs related to HTN, HLD, and Dementia  RNCM Clinical Goal(s):  Patient will verbalize understanding of plan for management of HTN, HLD, and  Dementia work with community resource care guide to address needs related to  Victoria Gates knowledge of community resource: for patient's with dementia and personal care services/day programs    through collaboration with Consulting civil engineer, provider, and care team.   Interventions: 1:1 collaboration with primary care provider regarding development and update of comprehensive plan of care as evidenced by provider attestation and co-signature Inter-disciplinary care team collaboration (see longitudinal plan of care) Evaluation of current treatment plan related to  self management and patient's adherence to plan as established by provider Discussed caregiver needs-He denies being overwhelmed.  Hypertension Interventions: Last practice recorded BP readings:  BP Readings from Last 3 Encounters:  07/12/21 118/60  07/04/21 116/62  06/26/21 107/76  Most recent eGFR/CrCl:  Lab Results  Component Value Date   EGFR >60 08/13/2017    No components found for: CRCL  Evaluation of current treatment plan related to hypertension self management and patient's adherence to plan as established by provider; Reviewed medications with patient and discussed importance of compliance; Discussed plans with patient for ongoing care management follow up and provided patient with direct contact information for care management team; Hyperlipidemia Interventions: Medication review performed; medication list updated in electronic medical record.  Provided HLD educational materials;  Patient Goals/Self-Care Activities: Patient will take medications as prescribed. Work with [place]  to obtain local community resources in the area. Caregiver will continue to promote patient safety by having someone with patient around the clock. Continue to allow patient independence with self care activities for patient as long as they are safe to do.  Minimize situations that promote anxiety or agitation. Caregiver will contact provider  for questions or concerns Contact your nurse Care Manager as needed for ongoing care coordination or care management needs.    Plan: Telephone follow up appointment with care management team member scheduled for: next month The patient/caregiver has been provided with contact information for the care management team and has been advised to call with any health related questions or concerns.   Thea Silversmith, RN, MSN, BSN, CCM Care Management Coordinator [facility] [phone]

## 2021-07-27 NOTE — Patient Instructions (Signed)
Visit Information: Thank you for taking the time to speak with me today.   Consent to CCM Services: Ms. Beshara was given information about Chronic Care Management services including:  CCM service includes personalized support from designated clinical staff supervised by her physician, including individualized plan of care and coordination with other care providers 24/7 contact phone numbers for assistance for urgent and routine care needs. Service will only be billed when office clinical staff spend 20 minutes or more in a month to coordinate care. Only one practitioner may furnish and bill the service in a calendar month. The patient may stop CCM services at any time (effective at the end of the month) by phone call to the office staff. The patient will be responsible for cost sharing (co-pay) of up to 20% of the service fee (after annual deductible is met).  Patient agreed to services and verbal consent obtained.   Patient verbalizes understanding of instructions provided today and agrees to view in [place].   Telephone follow up appointment with care management team member scheduled for: The patient has been provided with contact information for the care management team and has been advised to call with any health related questions or concerns.   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 RNCM spoke with patient's husband, primary caregiver and DPR. He reports  everything is going fine now. He states Ms. Sergent sister is staying with her every day and that someone is with patient all the times. Mr. Stingley is managing patient's medications and he states she is taking her medications as prescribed. He also reports she is eating without difficulty and is able to ambulate, bath and dress without difficulty. He states since Ms. Earhart's sister is staying with her every day, there are no questions or concern, but is willing to receive Personal care service information for future references. Care Coordination needs related to Lacks knowledge of community resource: for patient's with dementia and personal care services/day programs Chronic Disease Management support and education needs related to HTN, HLD, and Dementia  RNCM Clinical Goal(s):  Patient will verbalize understanding of plan for management of HTN, HLD, and Dementia work with community resource care guide to address needs related to  [employer] knowledge of community resource: for patient's with dementia and personal care services/day programs    through collaboration with Consulting civil engineer, provider, and care team.   Interventions: 1:1 collaboration with primary care provider regarding development and update of comprehensive plan of care as evidenced by provider attestation and co-signature Inter-disciplinary care team collaboration (see longitudinal plan of care) Evaluation of current treatment plan related to  self management and patient's adherence to plan as established by provider Discussed caregiver needs-He denies being overwhelmed.  Hypertension Interventions: Last practice recorded BP readings:  BP Readings from Last 3 Encounters:  07/12/21 118/60  07/04/21 116/62  06/26/21 107/76  Most recent eGFR/CrCl:  Lab Results  Component Value Date   EGFR >60 08/13/2017  No components found for: CRCL  Evaluation of current treatment plan related to hypertension self management and patient's  adherence to plan as established by provider; Reviewed medications with patient and discussed importance of compliance; Discussed plans with patient for ongoing care management follow up and provided patient with direct contact information for care management team; Hyperlipidemia Interventions: Medication review performed; medication list updated in electronic medical record.  Provided HLD educational materials;  Patient Goals/Self-Care Activities: Patient will take medications as prescribed. Work with [place] to obtain local community resources in the area. Caregiver will continue to promote patient safety by having someone with patient around the clock. Continue to allow patient independence with self care activities for patient as long as they are safe to do.  Minimize situations that promote anxiety or agitation. Caregiver will contact provider for questions or concerns Contact your nurse Care Manager as needed for ongoing care coordination or care management needs.

## 2021-07-30 DIAGNOSIS — I10 Essential (primary) hypertension: Secondary | ICD-10-CM | POA: Diagnosis not present

## 2021-07-30 DIAGNOSIS — F0280 Dementia in other diseases classified elsewhere without behavioral disturbance: Secondary | ICD-10-CM

## 2021-07-30 DIAGNOSIS — E785 Hyperlipidemia, unspecified: Secondary | ICD-10-CM

## 2021-07-30 DIAGNOSIS — G3109 Other frontotemporal dementia: Secondary | ICD-10-CM

## 2021-08-03 ENCOUNTER — Telehealth: Payer: Self-pay

## 2021-08-03 NOTE — Telephone Encounter (Signed)
   Telephone encounter was:  Successful.  08/03/2021 Name: [name] MRN: [id number] DOB: Jan 02, 1951  Victoria Gates is a 70 y.o. year old female who is a primary care patient of Ann Held, DO . The community resource team was consulted for assistance with  dementia care and in-home care.  Care guide performed the following interventions: Spoke with patient's spouse Victoria Gates confirmed mailing address will send information about dementia care and in-home care services next week.  Follow Up Plan:  Care guide will follow up with patient by phone over the next 7-10 days.  Jacarie Pate, AAS Paralegal, [place] [facility]  [address], [place] [address] ??[email or web address]  ?? [phone]   [email or web address]

## 2021-08-17 ENCOUNTER — Telehealth: Payer: Self-pay

## 2021-08-17 NOTE — Telephone Encounter (Signed)
   Telephone encounter was:  Unsuccessful.  08/17/2021 Name: Victoria Gates MRN: [id number] DOB: [birth year]  Unsuccessful outbound call made today to assist with:   dementia care, in-home care . Left message on patient's spouse voicemail to return my call regarding information mailed on 08/04/21.  Outreach Attempt:  2nd Attempt  A HIPAA compliant voice message was left requesting a return call.  Instructed patient to call back at [phone].  Karinne Schmader, AAS Paralegal, [place] [facility]  [address], [place] [address] ??[email or web address]  ?? [phone]   [email or web address]

## 2021-08-21 ENCOUNTER — Telehealth: Payer: Self-pay

## 2021-08-21 NOTE — Telephone Encounter (Signed)
   Telephone encounter was:  Successful.  08/21/2021 Name: BEAULAH ROMANEK MRN: [id number] DOB: 1951/08/22  QUANTASIA STEGNER is a 70 y.o. year old female who is a primary care patient of Ann Held, DO . The community resource team was consulted for assistance with  dementia and in-home care  Care guide performed the following interventions: Spoke with patient's spouse Chrissie Noa he has received resources mailed for Dementia and in-home care.  Follow Up Plan:  No further follow up planned at this time. The patient has been provided with needed resources.  Pate Aylward, AAS Paralegal, [place] [facility]  [address], [place] [address] ??[email or web address]  ?? [phone]   [email or web address]

## 2021-08-28 ENCOUNTER — Ambulatory Visit (INDEPENDENT_AMBULATORY_CARE_PROVIDER_SITE_OTHER): Payer: BC Managed Care – PPO

## 2021-08-28 DIAGNOSIS — I10 Essential (primary) hypertension: Secondary | ICD-10-CM

## 2021-08-28 DIAGNOSIS — F0280 Dementia in other diseases classified elsewhere without behavioral disturbance: Secondary | ICD-10-CM

## 2021-08-28 DIAGNOSIS — E785 Hyperlipidemia, unspecified: Secondary | ICD-10-CM

## 2021-08-28 NOTE — Patient Instructions (Signed)
Visit Information  Thank you for taking time to visit with me today. Please don't hesitate to contact me if I can be of assistance to you before our next scheduled telephone appointment.  Following are the goals we discussed today:  Patient Goals/Self-Care Activities: Continue to take medications as prescribed. Continue to provide safe environment: personal care assistance and supervision. Evaluate your kitchen for potential safety hazards- examples: use appliances that have an automatic shut-off feature, apply stove knob covers, remove knobs or turn off gas when stove is not in use, disconnect the garbage disposal. Evaluate your home for hazardous equipment/chemicals-Keep out of reach for example medications, alcohol, matches sharp objects, cleaning products, laundry pacs, bleach and other harmful chemicals. Contact Alzheimer's Association for more information [phone].   Continue to allow patient independence with self care activities for patient as long as they are safe to do.  Minimize situations that promote anxiety or agitation. Contact provider for questions or concerns Contact your nurse Care Manager as needed for ongoing care coordination or care management needs.  Our next appointment is by telephone on 09/25/21 at 10:00 am.  Please call the care guide team at [phone] if you need to cancel or reschedule your appointment.   If you are experiencing a Mental Health or [place] or need someone to talk to, please call the Suicide and Crisis Lifeline: 988 call 1-800-273-TALK (toll free, 24 hour hotline)   Patient verbalizes understanding of instructions provided today and agrees to view in [place].   Thea Silversmith, RN, MSN, BSN, CCM Care Management Coordinator [facility] [phone]

## 2021-08-28 NOTE — Chronic Care Management (AMB) (Signed)
Chronic Care Management   CCM RN Visit Note  08/28/2021 Name: Victoria Gates MRN: [id number] DOB: [birth year]  Subjective: Victoria Gates is a 70 y.o. year old female who is a primary care patient of Ann Held, DO. The care management team was consulted for assistance with disease management and care coordination needs.    Engaged with patient by telephone for follow up visit in response to provider referral for case management and/or care coordination services.   Consent to Services:  The patient was given information about Chronic Care Management services, agreed to services, and gave verbal consent prior to initiation of services.  Please see initial visit note for detailed documentation.   Patient agreed to services and verbal consent obtained.   Assessment: Review of patient past medical history, allergies, medications, health status, including review of consultants reports, laboratory and other test data, was performed as part of comprehensive evaluation and provision of chronic care management services.   SDOH (Social Determinants of Health) assessments and interventions performed:    CCM Care Plan  Allergies  Allergen Reactions   Atorvastatin     REACTION: myalgias   Rosuvastatin     REACTION: myalgias   Sulfonamide Derivatives     Unknown reaction     Outpatient Encounter Medications as of 08/28/2021  Medication Sig   Ascorbic Acid (VITAMIN C) 1000 MG tablet Take 1,000 mg by mouth daily.    Calcium 500-125 MG-UNIT TABS Take 1 tablet by mouth daily.   cholecalciferol (VITAMIN D) 1000 UNITS tablet Take 1,000 Units by mouth daily.   ibuprofen (ADVIL) 600 MG tablet Take 600 mg by mouth every 6 (six) hours as needed.   lansoprazole (PREVACID) 30 MG capsule Take 30 mg by mouth daily with breakfast.    Pitavastatin Calcium (LIVALO) 2 MG TABS TAKE 1 TABLET BY MOUTH EVERY DAY   Potassium Chloride ER 20 MEQ TBCR TAKE 1 TABLET BY MOUTH EVERY DAY   rivastigmine  (EXELON) 1.5 MG capsule Take 1 capsule (1.5 mg total) by mouth 2 (two) times daily.   sertraline (ZOLOFT) 25 MG tablet Take 1 tablet (25 mg total) by mouth daily.   No facility-administered encounter medications on file as of 08/28/2021.    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   Pre-op examination 04/03/2021   Hyperlipidemia 07/03/2020   Mild neurocognitive disorder, 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   Bradycardia 04/29/2016   Hypokalemia 11/30/2015   Neutropenia ([place]) 05/03/2015   Syncope 09/11/2014   Bronchitis 09/08/2014   Multiple myeloma ([place]) 10/22/2013   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   Hyperproteinemia 04/14/2013   Midsternal chest pain 04/20/2012   Lipoma of shoulder 11/02/2011   Herpes zoster 11/05/2010   GERD (gastroesophageal reflux disease) 09/21/2010   Cervical Radiculopathy, Left 02/01/2010   Hypercholesterolemia 07/03/2009   Otitis Externa 07/03/2009   Cerumen impaction 07/03/2009   Myalgia 07/03/2009   Allergic rhinitis 01/02/2009   Diet-controlled diabetes mellitus ([place]) 03/18/2008   Chest pain 12/24/2007   URI 12/12/2007   Anemia-Iron Deficiency 04/24/2007   Essential hypertension 04/24/2007   Osteoporosis 04/24/2007   Esophageal Stricture 04/15/2007   Hiatal Hernia 04/15/2007    Conditions to be addressed/monitored:HTN, HLD, and Dementia  Care Plan : RN Care Manager Plan of Care  Updates made by Luretha Rued, RN since 08/28/2021  12:00 AM     Problem: No plan of care for Managment of Chronic Condition Dementia in a patient with HTN, HLD   Priority: High     Long-Range Goal: Developement of plan of care for Management of Dementia   Start Date: 07/27/2021  Expected End Date: 10/27/2021  Priority: High  Note:   Current Barriers:  Patient with history of HTN, 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 RNCM spoke with patient's husband, primary caregiver and DPR. Mr. Cast states, "everything is headed in the right direction". He reports he has received community resource information provided by the care guide and states he is in the process of reviewing the information. He reports Mrs. Dias is eating well and taking her medications as prescribed. He denies any questions or concerns at this time.  Care Coordination needs related to Lacks knowledge of community resource: for patient's with dementia and personal care services/day programs Chronic Disease Management support and education needs related to HTN, HLD, and Dementia  RNCM Clinical Goal(s):  Patient will verbalize understanding of plan for management of HTN, HLD, and Dementia work with community resource care guide to address needs related to  [employer] knowledge of community resource: for patient's with dementia and personal care services/day programs    through collaboration with Consulting civil engineer, provider, and care team.   Interventions: 1:1 collaboration with primary care provider regarding development and update of comprehensive plan of care as evidenced by provider attestation and co-signature Inter-disciplinary care team collaboration (see longitudinal plan of care) Evaluation of current treatment plan related to  self management and patient's adherence to plan as established by provider Discussed caregiver needs-He denies being overwhelmed.  Hypertension Interventions: Goal: on track Last practice recorded BP readings: reports blood pressure checked this past weekend: 115/85. BP Readings from Last 3 Encounters:  07/12/21 118/60  07/04/21 116/62  06/26/21 107/76  Most recent eGFR/CrCl:  Lab Results  Component Value Date   EGFR >60 08/13/2017    No components found for: CRCL  Evaluation of current treatment plan related to hypertension  self management and patient's adherence to plan as established by provider; Reviewed medications with patient and discussed importance of compliance; Discussed plans with patient for ongoing care management follow up and provided patient with direct contact information for care management team; Hyperlipidemia Interventions: Goal: on track Medication review performed; medication list updated in electronic medical record.  Provided HLD educational materials;  Patient Goals/Self-Care Activities: Continue to take medications as prescribed. Continue to provide safe environment: personal care assistance and supervision. Evaluate your kitchen for potential safety hazards- use appliances that have an automatic shut-off feature, apply stove knob covers, remove knobs or turn off gas when stove is not in use, disconnect the garbage disposal. Evaluate your home for hazardous equipment/chemicals-Keep out of reach for example medications, alcohol, matches sharp objects, cleaning products, laundry pacs, bleach and other harmful chemicals. Contact Alzheimer's Association for more information [phone].   Continue to allow patient independence with self care activities for patient as long as they are safe to do.  Minimize situations that promote anxiety or agitation. Contact provider for questions or concerns Contact your nurse Care Manager as needed for ongoing care coordination or care management needs.      Plan:Telephone follow up appointment with care management team member scheduled for:  1-2 months The patient has been provided with contact information for the care management team and has been advised to call with any health related questions or concerns.  Thea Silversmith, RN, MSN, BSN, CCM Care Management Coordinator [facility] [phone]

## 2021-08-29 DIAGNOSIS — F0280 Dementia in other diseases classified elsewhere without behavioral disturbance: Secondary | ICD-10-CM

## 2021-08-29 DIAGNOSIS — I10 Essential (primary) hypertension: Secondary | ICD-10-CM

## 2021-08-29 DIAGNOSIS — G3109 Other frontotemporal dementia: Secondary | ICD-10-CM

## 2021-08-29 DIAGNOSIS — E785 Hyperlipidemia, unspecified: Secondary | ICD-10-CM | POA: Diagnosis not present

## 2021-09-12 ENCOUNTER — Ambulatory Visit (INDEPENDENT_AMBULATORY_CARE_PROVIDER_SITE_OTHER): Payer: BC Managed Care – PPO | Admitting: Endocrinology

## 2021-09-12 ENCOUNTER — Inpatient Hospital Stay: Payer: BC Managed Care – PPO | Attending: Internal Medicine

## 2021-09-12 ENCOUNTER — Other Ambulatory Visit: Payer: Self-pay

## 2021-09-12 VITALS — BP 110/60 | HR 79 | Ht 68.0 in | Wt 154.6 lb

## 2021-09-12 DIAGNOSIS — E119 Type 2 diabetes mellitus without complications: Secondary | ICD-10-CM

## 2021-09-12 DIAGNOSIS — C9000 Multiple myeloma not having achieved remission: Secondary | ICD-10-CM | POA: Insufficient documentation

## 2021-09-12 DIAGNOSIS — C9001 Multiple myeloma in remission: Secondary | ICD-10-CM

## 2021-09-12 LAB — CMP (CANCER CENTER ONLY)
ALT: 46 U/L — ABNORMAL HIGH (ref 0–44)
AST: 41 U/L (ref 15–41)
Albumin: 4 g/dL (ref 3.5–5.0)
Alkaline Phosphatase: 68 U/L (ref 38–126)
Anion gap: 8 (ref 5–15)
BUN: 15 mg/dL (ref 8–23)
CO2: 26 mmol/L (ref 22–32)
Calcium: 9.2 mg/dL (ref 8.9–10.3)
Chloride: 104 mmol/L (ref 98–111)
Creatinine: 0.86 mg/dL (ref 0.44–1.00)
GFR, Estimated: >60 mL/min (ref >60)
Glucose, Bld: 100 mg/dL — ABNORMAL HIGH (ref 70–99)
Potassium: 3.9 mmol/L (ref 3.5–5.1)
Sodium: 138 mmol/L (ref 135–145)
Total Bilirubin: 0.7 mg/dL (ref 0.3–1.2)
Total Protein: 8.3 g/dL — ABNORMAL HIGH (ref 6.5–8.1)

## 2021-09-12 LAB — CBC WITH DIFFERENTIAL (CANCER CENTER ONLY)
Abs Immature Granulocytes: 0.02 10*3/uL (ref 0.00–0.07)
Basophils Absolute: 0 10*3/uL (ref 0.0–0.1)
Basophils Relative: 1 %
Eosinophils Absolute: 0.2 10*3/uL (ref 0.0–0.5)
Eosinophils Relative: 3 %
HCT: 39.9 % (ref 36.0–46.0)
Hemoglobin: 13 g/dL (ref 12.0–15.0)
Immature Granulocytes: 0 %
Lymphocytes Relative: 52 %
Lymphs Abs: 3.6 10*3/uL (ref 0.7–4.0)
MCH: 27.8 pg (ref 26.0–34.0)
MCHC: 32.6 g/dL (ref 30.0–36.0)
MCV: 85.3 fL (ref 80.0–100.0)
Monocytes Absolute: 0.6 10*3/uL (ref 0.1–1.0)
Monocytes Relative: 8 %
Neutro Abs: 2.6 10*3/uL (ref 1.7–7.7)
Neutrophils Relative %: 36 %
Platelet Count: 175 10*3/uL (ref 150–400)
RBC: 4.68 MIL/uL (ref 3.87–5.11)
RDW: 13 % (ref 11.5–15.5)
WBC Count: 7 10*3/uL (ref 4.0–10.5)
nRBC: 0 % (ref 0.0–0.2)

## 2021-09-12 LAB — POCT GLYCOSYLATED HEMOGLOBIN (HGB A1C): Hemoglobin A1C: 5.7 % — AB (ref 4.0–5.6)

## 2021-09-12 LAB — LACTATE DEHYDROGENASE: LDH: 159 U/L (ref 98–192)

## 2021-09-12 NOTE — Progress Notes (Signed)
Subjective:    Patient ID: Victoria Gates, female    DOB: 02/04/1951, 70 y.o.   MRN: [id number]  HPI Pt returns for f/u of diabetes mellitus:  DM type: 2 Dx'ed: 2007 Complications: stage 2 CRI.     Therapy: no medication now.   GDM: never DKA: never Severe hypoglycemia: never Pancreatitis: never Other: she has never been on insulin.   Interval history: pt states she feels well in general.  Pt says diet and activity are good.   Past Medical History:  Diagnosis Date   Allergic rhinitis 01/02/2009   Bradycardia 04/29/2016   Cervical Radiculopathy 02/01/2010   Left side   Chest pain 04/15/2012   Ex MV: Ex time 10 mins, EF 63%, no ischemia/infarct. Occas PAC's/PVC's.   Diabetes mellitus, type II 03/18/2008   Encounter for antineoplastic chemotherapy 04/29/2016   Esophageal Stricture 04/15/2007   s/p dilitation   GERD (gastroesophageal reflux disease) 09/21/2010   Herpes zoster 11/05/2010   Hiatal Hernia 04/15/2007   History of colonic polyps 04/24/2007   Hyperplastic only   Hypercholesterolemia 07/03/2009   Hypokalemia 11/30/2015   Iron deficiency anemia 04/24/2007   Multiple myeloma (HCC) 02/2014   Osteoporosis 04/24/2007    Past Surgical History:  Procedure Laterality Date   ESOPHAGOGASTRODUODENOSCOPY  04/15/2007   TUBAL LIGATION      Social History   Socioeconomic History   Marital status: Married    Spouse name: Not on file   Number of children: Not on file   Years of education: Not on file   Highest education level: Not on file  Occupational History   Occupation: MAIL Marine scientist: [id number]  Tobacco Use   Smoking status: Never   Smokeless tobacco: Never  Vaping Use   Vaping Use: Never used  Substance and Sexual Activity   Alcohol use: No   Drug use: No   Sexual activity: Yes  Other Topics Concern   Not on file  Social History Narrative   Lives in [place] with spouse.   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   Right Handed    Social Determinants of Health   Financial Resource Strain: Not on file  Food Insecurity: No Food Insecurity   Worried About Programme researcher, broadcasting/film/video in the Last Year: Never true   Ran Out of Food in the Last Year: Never true  Transportation Needs: No Transportation Needs   Lack of Transportation (Medical): No   Lack of Transportation (Non-Medical): No  Physical Activity: Not on file  Stress: Not on file  Social Connections: Not on file  Intimate Partner Violence: Not on file    Current Outpatient Medications on File Prior to Visit  Medication Sig Dispense Refill   Ascorbic Acid (VITAMIN C) 1000 MG tablet Take 1,000 mg by mouth daily.      Calcium 500-125 MG-UNIT TABS Take 1 tablet by mouth daily.     cholecalciferol (VITAMIN D) 1000 UNITS tablet Take 1,000 Units by mouth daily.     ibuprofen (ADVIL) 600 MG tablet Take 600 mg by mouth every 6 (six) hours as needed.     lansoprazole (PREVACID) 30 MG capsule Take 30 mg by mouth daily with breakfast.      Pitavastatin Calcium (LIVALO) 2 MG TABS TAKE 1 TABLET BY MOUTH EVERY DAY 90 tablet 1   Potassium Chloride ER 20 MEQ TBCR TAKE 1 TABLET BY MOUTH EVERY DAY 90 tablet 1   rivastigmine (EXELON) 1.5 MG capsule  Take 1 capsule (1.5 mg total) by mouth 2 (two) times daily. 180 capsule 3   sertraline (ZOLOFT) 25 MG tablet Take 1 tablet (25 mg total) by mouth daily. 90 tablet 3   No current facility-administered medications on file prior to visit.    Allergies  Allergen Reactions   Atorvastatin     REACTION: myalgias   Rosuvastatin     REACTION: myalgias   Sulfonamide Derivatives     Unknown reaction     Family History  Problem Relation Age of Onset   Brain cancer Mother    Heart disease Mother    Lung cancer Father    Heart disease Father    Diabetes Sister    Heart disease Sister    Heart attack Sister    ALS Sister    Diabetes Brother    Heart disease Brother    Cancer Neg Hx        No FH of Colon Cancer   Colon cancer Neg  Hx    Esophageal cancer Neg Hx    Rectal cancer Neg Hx    Stomach cancer Neg Hx     BP 110/60    Pulse 79    Ht $R'5\' 8"'Fb$  (1.727 m)    Wt 154 lb 9.6 oz (70.1 kg)    SpO2 99%    BMI 23.51 kg/m    Review of Systems She has lost 42 lbs since last ov--husb says intentional (and due to tooth loss).      Objective:   Physical Exam VITAL SIGNS:  See vs page GENERAL: no distress.  Constant laughter and constant movement in exam room    A1c=5.7%    Assessment & Plan:  Type 2 DM: stable off rx.  I told pt and husb no medication is needed for this now.  Neurocognitive disorder.  Check TFT.   Patient Instructions  You don't need any medication for the blood sugar now.  Blood tests are requested for you today.  We'll let you know about the results.   Please come back for a follow-up appointment in 1 year.

## 2021-09-12 NOTE — Patient Instructions (Addendum)
You don't need any medication for the blood sugar now.  Blood tests are requested for you today.  We'll let you know about the results.   Please come back for a follow-up appointment in 1 year.

## 2021-09-13 LAB — IGG, IGA, IGM
IgA: 522 mg/dL — ABNORMAL HIGH (ref 87–352)
IgG (Immunoglobin G), Serum: 1797 mg/dL — ABNORMAL HIGH (ref 586–1602)
IgM (Immunoglobulin M), Srm: 38 mg/dL (ref 26–217)

## 2021-09-13 LAB — BETA 2 MICROGLOBULIN, SERUM: Beta-2 Microglobulin: 1.5 mg/L (ref 0.6–2.4)

## 2021-09-13 LAB — KAPPA/LAMBDA LIGHT CHAINS
Kappa free light chain: 29.6 mg/L — ABNORMAL HIGH (ref 3.3–19.4)
Kappa, lambda light chain ratio: 1.35 (ref 0.26–1.65)
Lambda free light chains: 21.9 mg/L (ref 5.7–26.3)

## 2021-09-19 ENCOUNTER — Other Ambulatory Visit: Payer: Self-pay

## 2021-09-19 ENCOUNTER — Encounter: Payer: Self-pay | Admitting: Internal Medicine

## 2021-09-19 ENCOUNTER — Inpatient Hospital Stay (HOSPITAL_BASED_OUTPATIENT_CLINIC_OR_DEPARTMENT_OTHER): Payer: BC Managed Care – PPO | Admitting: Internal Medicine

## 2021-09-19 VITALS — BP 105/70 | HR 93 | Temp 97.7°F | Resp 20 | Ht 68.0 in | Wt 154.9 lb

## 2021-09-19 DIAGNOSIS — C9000 Multiple myeloma not having achieved remission: Secondary | ICD-10-CM | POA: Diagnosis not present

## 2021-09-19 DIAGNOSIS — C9001 Multiple myeloma in remission: Secondary | ICD-10-CM

## 2021-09-19 NOTE — Progress Notes (Signed)
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  PRIOR THERAPY: 1) Systemic chemotherapy with Carfilzomib, Cytoxan and dexamethasone. First dose on 11/15/2013. Status post 5 cycles. 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 3) Maintenance Revlimid 10 mg by mouth daily. Status post 2 months of treatment. 4) Maintenance Revlimid 15 mg by mouth daily. Status post 4 months of treatment. 5) Maintenance treatment with Revlimid 10 mg by mouth daily status post 17 months. She completed 2 years of treatment in February 2018.  CURRENT THERAPY: Observation.  INTERVAL HISTORY: Victoria Gates 70 y.o. female returns to the clinic today for follow-up visit accompanied by her husband.  The patient is feeling fine today with no concerning complaints except for the dementia.  She denied having any current chest pain, shortness of breath, cough or hemoptysis.  She denied having any nausea, vomiting, diarrhea or constipation.  She has no headache or visual changes.  She has no recent weight loss or night sweats.  She is here today for evaluation with repeat myeloma panel.  MEDICAL HISTORY: Past Medical History:  Diagnosis Date   Allergic rhinitis 01/02/2009   Bradycardia 04/29/2016   Cervical Radiculopathy 02/01/2010   Left side   Chest pain 04/15/2012   Ex MV: Ex time 10 mins, EF 63%, no ischemia/infarct. Occas PAC's/PVC's.   Diabetes mellitus, type II 03/18/2008   Encounter for antineoplastic chemotherapy 04/29/2016   Esophageal Stricture 04/15/2007   s/p dilitation   GERD (gastroesophageal reflux disease) 09/21/2010   Herpes zoster 11/05/2010   Hiatal Hernia 04/15/2007   History of colonic polyps 04/24/2007   Hyperplastic only   Hypercholesterolemia 07/03/2009    Hypokalemia 11/30/2015   Iron deficiency anemia 04/24/2007   Multiple myeloma ([place]) 02/2014   Osteoporosis 04/24/2007    ALLERGIES:  is allergic to atorvastatin, rosuvastatin, and sulfonamide derivatives.  MEDICATIONS:  Current Outpatient Medications  Medication Sig Dispense Refill   Ascorbic Acid (VITAMIN C) 1000 MG tablet Take 1,000 mg by mouth daily.      Calcium 500-125 MG-UNIT TABS Take 1 tablet by mouth daily.     cholecalciferol (VITAMIN D) 1000 UNITS tablet Take 1,000 Units by mouth daily.     ibuprofen (ADVIL) 600 MG tablet Take 600 mg by mouth every 6 (six) hours as needed.     lansoprazole (PREVACID) 30 MG capsule Take 30 mg by mouth daily with breakfast.      Pitavastatin Calcium (LIVALO) 2 MG TABS TAKE 1 TABLET BY MOUTH EVERY DAY 90 tablet 1   Potassium Chloride ER 20 MEQ TBCR TAKE 1 TABLET BY MOUTH EVERY DAY 90 tablet 1   rivastigmine (EXELON) 1.5 MG capsule Take 1 capsule (1.5 mg total) by mouth 2 (two) times daily. 180 capsule 3   sertraline (ZOLOFT) 25 MG tablet Take 1 tablet (25 mg total) by mouth daily. 90 tablet 3   No current facility-administered medications for this visit.    SURGICAL HISTORY:  Past Surgical History:  Procedure Laterality Date   ESOPHAGOGASTRODUODENOSCOPY  04/15/2007   TUBAL LIGATION      REVIEW OF SYSTEMS:  A comprehensive review of systems was negative.   PHYSICAL EXAMINATION: General appearance: alert, cooperative, and no distress Head: Normocephalic, without obvious abnormality, atraumatic Neck: no adenopathy, no JVD, supple, symmetrical, trachea  midline, and thyroid not enlarged, symmetric, no tenderness/mass/nodules Lymph nodes: Cervical, supraclavicular, and axillary nodes normal. Resp: clear to auscultation bilaterally Back: symmetric, no curvature. ROM normal. No CVA tenderness. Cardio: regular rate and rhythm, S1, S2 normal, no murmur, click, rub or gallop GI: soft, non-tender; bowel sounds normal; no masses,  no  organomegaly Extremities: extremities normal, atraumatic, no cyanosis or edema  ECOG PERFORMANCE STATUS: 1 - Symptomatic but completely ambulatory  Blood pressure 105/70, pulse 93, temperature 97.7 F (36.5 C), temperature source Tympanic, resp. rate 20, height _0  (1.727 m), weight 154 lb 14.4 oz (70.3 kg), SpO2 98 %.  LABORATORY DATA: Lab Results  Component Value Date   WBC 7.0 09/12/2021   HGB 13.0 09/12/2021   HCT 39.9 09/12/2021   MCV 85.3 09/12/2021   PLT 175 09/12/2021      Chemistry      Component Value Date/Time   NA 138 09/12/2021 1042   NA 139 08/13/2017 0816   K 3.9 09/12/2021 1042   K 3.2 (L) 08/13/2017 0816   CL 104 09/12/2021 1042   CO2 26 09/12/2021 1042   CO2 26 08/13/2017 0816   BUN 15 09/12/2021 1042   BUN 9.0 08/13/2017 0816   CREATININE 0.86 09/12/2021 1042   CREATININE 1.04 (H) 07/03/2020 0843   CREATININE 1.0 08/13/2017 0816      Component Value Date/Time   CALCIUM 9.2 09/12/2021 1042   CALCIUM 9.2 08/13/2017 0816   ALKPHOS 68 09/12/2021 1042   ALKPHOS 59 08/13/2017 0816   AST 41 09/12/2021 1042   AST 22 08/13/2017 0816   ALT 46 (H) 09/12/2021 1042   ALT 17 08/13/2017 0816   BILITOT 0.7 09/12/2021 1042   BILITOT 0.80 08/13/2017 0816       RADIOGRAPHIC STUDIES: No results found.  ASSESSMENT AND PLAN:  This is a very pleasant 70 years old African-American female with multiple myeloma, IgA subtype status post induction systemic chemotherapy with Carfilzomib, Cytoxan and dexamethasone followed by peripheral blood autologous stem cell transplant followed by 2 years maintenance of treatment with Revlimid. The patient has been in observation since that time and she is feeling fine today with no concerning complaints. She had repeat myeloma panel performed recently.  I personally discussed the lab results with the patient and her husband. Her myeloma panel showed no concerning findings for progression. I recommended for her to continue on  observation with repeat myeloma panel in 6 months. The patient was advised to call immediately if she has any other concerning symptoms in the interval. The patient voices understanding of current disease status and treatment options and is in agreement with the current care plan. All questions were answered. The patient knows to call the clinic with any problems, questions or concerns. We can certainly see the patient much sooner if necessary.  Disclaimer: This note was dictated with voice recognition software. Similar sounding words can inadvertently be transcribed and may not be corrected upon review.

## 2021-09-25 ENCOUNTER — Ambulatory Visit (INDEPENDENT_AMBULATORY_CARE_PROVIDER_SITE_OTHER): Payer: BC Managed Care – PPO

## 2021-09-25 DIAGNOSIS — E1169 Type 2 diabetes mellitus with other specified complication: Secondary | ICD-10-CM

## 2021-09-25 DIAGNOSIS — I10 Essential (primary) hypertension: Secondary | ICD-10-CM

## 2021-09-25 NOTE — Patient Instructions (Addendum)
Visit Information  Thank you for taking time to visit with me today. Please don't hesitate to contact me if I can be of assistance to you before our next scheduled telephone appointment.  Following are the goals we discussed today:  Patient Goals/Self-Care Activities: Continue to take medications as prescribed. Safe environment strategies: personal care assistance and supervision. Evaluate your kitchen for potential safety hazards- use appliances that have an automatic shut-off feature, apply stove knob covers, remove knobs or turn off gas when stove is not in use, disconnect the garbage disposal. Evaluate your home for hazardous equipment/chemicals-Keep out of reach for example medications, alcohol, matches sharp objects, cleaning products, laundry pacs, bleach and other harmful chemicals. Contact [employer] for more information [phone].   Continue to allow patient independence with self care activities for patient as long as they are safe to do.  Continue to minimize situations that promote anxiety or agitation. Contact provider for questions or concerns Contact your nurse Care Manager as needed for ongoing care coordination or care management needs.  Our next appointment is by telephone on 11/20/2021 at 10 am  Please call the care guide team at [phone] if you need to cancel or reschedule your appointment.   If you are experiencing a Mental Health or [place] or need someone to talk to, please call the Suicide and Crisis Lifeline: 988 call 1-800-273-TALK (toll free, 24 hour hotline)   Patient verbalizes understanding of instructions provided today and agrees to view in [place].   Thea Silversmith, RN, MSN, BSN, CCM Care Management Coordinator [facility] [phone]

## 2021-09-25 NOTE — Chronic Care Management (AMB) (Signed)
Chronic Care Management   CCM RN Visit Note  09/25/2021 Name: Victoria Gates MRN: [id number] DOB: May 22, 1951  Subjective: Victoria Gates is a 70 y.o. year old female who is a primary care patient of Ann Held, DO. The care management team was consulted for assistance with disease management and care coordination needs.    Engaged with patient by telephone for follow up visit in response to provider referral for case management and/or care coordination services.   Consent to Services:  The patient was given information about Chronic Care Management services, agreed to services, and gave verbal consent prior to initiation of services.  Please see initial visit note for detailed documentation.   Patient agreed to services and verbal consent obtained.   Assessment: Review of patient past medical history, allergies, medications, health status, including review of consultants reports, laboratory and other test data, was performed as part of comprehensive evaluation and provision of chronic care management services.   SDOH (Social Determinants of Health) assessments and interventions performed:    CCM Care Plan  Allergies  Allergen Reactions   Atorvastatin     REACTION: myalgias   Rosuvastatin     REACTION: myalgias   Sulfonamide Derivatives     Unknown reaction     Outpatient Encounter Medications as of 09/25/2021  Medication Sig   Ascorbic Acid (VITAMIN C) 1000 MG tablet Take 1,000 mg by mouth daily.    Calcium 500-125 MG-UNIT TABS Take 1 tablet by mouth daily.   cholecalciferol (VITAMIN D) 1000 UNITS tablet Take 1,000 Units by mouth daily.   ibuprofen (ADVIL) 600 MG tablet Take 600 mg by mouth every 6 (six) hours as needed.   lansoprazole (PREVACID) 30 MG capsule Take 30 mg by mouth daily with breakfast.    Pitavastatin Calcium (LIVALO) 2 MG TABS TAKE 1 TABLET BY MOUTH EVERY DAY   Potassium Chloride ER 20 MEQ TBCR TAKE 1 TABLET BY MOUTH EVERY DAY   rivastigmine  (EXELON) 1.5 MG capsule Take 1 capsule (1.5 mg total) by mouth 2 (two) times daily.   sertraline (ZOLOFT) 25 MG tablet Take 1 tablet (25 mg total) by mouth daily.   No facility-administered encounter medications on file as of 09/25/2021.    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[place]) 04/03/2021   Pre-op examination 04/03/2021   Hyperlipidemia 07/03/2020   Mild neurocognitive disorder, 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   Bradycardia 04/29/2016   Hypokalemia 11/30/2015   Neutropenia ([place]) 05/03/2015   Syncope 09/11/2014   Bronchitis 09/08/2014   Multiple myeloma ([place]) 10/22/2013   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   Hyperproteinemia 04/14/2013   Midsternal chest pain 04/20/2012   Lipoma of shoulder 11/02/2011   Herpes zoster 11/05/2010   GERD (gastroesophageal reflux disease) 09/21/2010   Cervical Radiculopathy, Left 02/01/2010   Hypercholesterolemia 07/03/2009   Otitis Externa 07/03/2009   Cerumen impaction 07/03/2009   Myalgia 07/03/2009   Allergic rhinitis 01/02/2009   Diet-controlled diabetes mellitus ([place]) 03/18/2008   Chest pain 12/24/2007   URI 12/12/2007   Anemia-Iron Deficiency 04/24/2007   Essential hypertension 04/24/2007   Osteoporosis 04/24/2007   Esophageal Stricture 04/15/2007   Hiatal Hernia 04/15/2007    Conditions to be addressed/monitored:HTN, HLD, and Dementia  Care Plan : RN Care Manager Plan of Care  Updates made by Luretha Rued, RN since  12:00 AM  °  ° °Problem: No plan of care for Managment of Chronic Condition Dementia in a patient with HTN, HLD   °Priority: High  °  ° °Long-Range Goal: Developement of plan of care for Management of Dementia   °Start Date: 07/27/2021  °Expected End Date: 10/27/2021  °Priority: High  °Note:   °Current Barriers:  Patient with history of HTN, 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 RNCM spoke with patient's husband, primary caregiver and DPR. Mr. Rudden states, Mrs. Tanney is doing well. He denies any concerns or issues at this time. Mrs. Servais saw endocrinologist 09/12/21. Patient remains diet controlled A1C 5.7 on 09/12/21.  Lipids on 04/03/21: Cholesterol 101; Triglyceride 102; HDL 25.3 and LDL 56. °Care Coordination needs related to Lacks knowledge of community resource: for patient's with dementia and personal care services/day programs °Chronic Disease Management support and education needs related to HTN, HLD, and Dementia ° °RNCM Clinical Goal(s):  °Patient will verbalize understanding of plan for management of HTN, HLD, and Dementia °work with community resource care guide to address needs related to  Lacks knowledge of community resource: for patient's with dementia and personal care services/day programs    through collaboration with RN Care manager, provider, and care team.  ° °Interventions: °1:1 collaboration with primary care provider regarding development and update of comprehensive plan of care as evidenced by provider attestation and co-signature °Inter-disciplinary care team collaboration (see longitudinal plan of care) °Evaluation of current treatment plan related to  self management and patient's adherence to plan as established by provider °Discussed caregiver needs-denies any problems ° °Hypertension Interventions: Goal: on track °BP Readings from Last 3 Encounters:  °09/19/21 105/70  °09/12/21 110/60  °07/12/21 118/60  °Most recent eGFR/CrCl:  °Lab Results  °Component Value Date  ° EGFR >60 08/13/2017  °  No components found for: CRCL ° °Evaluation of current treatment plan related to hypertension self management and patient's adherence to plan as established by provider; °Reviewed medications with patient and discussed importance of compliance; °Discussed plans with patient for  ongoing care management follow up and provided patient with direct contact information for care management team; ° °Hyperlipidemia Interventions: Goal: on track °Medication review performed; medication list updated in electronic medical record.  °Encouraged to continue to follow up with provider appointments as scheduled °Encouraged to continue to take medications as recommended. ° °Patient Goals/Self-Care Activities: °Continue to take medications as prescribed. °Safe environment strategies: personal care assistance and supervision. Evaluate your kitchen for potential safety hazards- use appliances that have an automatic shut-off feature, apply stove knob covers, remove knobs or turn off gas when stove is not in use, disconnect the garbage disposal. Evaluate your home for hazardous equipment/chemicals-Keep out of reach for example medications, alcohol, matches sharp objects, cleaning products, laundry pacs, bleach and other harmful chemicals. Contact [employer] for more information [phone].   °Continue to allow patient independence with self care activities for patient as long as they are safe to do.  °Continue to minimize situations that promote anxiety or agitation. °Contact provider for questions or concerns °Contact your nurse Care Manager as needed for ongoing care coordination or care management needs. ° °  °Plan:Telephone follow up appointment with care management team member scheduled for:  11/20/21 ° ° , RN, MSN, BSN, CCM °Care Management Coordinator °[facility] °[phone]  °

## 2021-09-28 ENCOUNTER — Telehealth: Payer: Self-pay | Admitting: Internal Medicine

## 2021-09-28 NOTE — Telephone Encounter (Signed)
Sch per 12/21 los, left msg °

## 2021-09-29 DIAGNOSIS — E785 Hyperlipidemia, unspecified: Secondary | ICD-10-CM | POA: Diagnosis not present

## 2021-09-29 DIAGNOSIS — E1169 Type 2 diabetes mellitus with other specified complication: Secondary | ICD-10-CM

## 2021-09-29 DIAGNOSIS — I10 Essential (primary) hypertension: Secondary | ICD-10-CM | POA: Diagnosis not present

## 2021-10-24 ENCOUNTER — Other Ambulatory Visit: Payer: Self-pay | Admitting: Family Medicine

## 2021-10-24 DIAGNOSIS — E876 Hypokalemia: Secondary | ICD-10-CM

## 2021-11-20 ENCOUNTER — Ambulatory Visit (INDEPENDENT_AMBULATORY_CARE_PROVIDER_SITE_OTHER): Payer: BC Managed Care – PPO

## 2021-11-20 DIAGNOSIS — E785 Hyperlipidemia, unspecified: Secondary | ICD-10-CM

## 2021-11-20 DIAGNOSIS — I10 Essential (primary) hypertension: Secondary | ICD-10-CM

## 2021-11-20 DIAGNOSIS — F0280 Dementia in other diseases classified elsewhere without behavioral disturbance: Secondary | ICD-10-CM

## 2021-11-20 NOTE — Chronic Care Management (AMB) (Signed)
Chronic Care Management   CCM RN Visit Note  11/20/2021 Name: Victoria Gates MRN: [id number] DOB: 1951/02/04  Subjective: Victoria Gates is a 71 y.o. year old female who is a primary care patient of Victoria Held, DO. The care management team was consulted for assistance with disease management and care coordination needs.    Engaged with patient by telephone for follow up visit in response to provider referral for case management and/or care coordination services.   Consent to Services:  The patient was given information about Chronic Care Management services, agreed to services, and gave verbal consent prior to initiation of services.  Please see initial visit note for detailed documentation.   Patient agreed to services and verbal consent obtained.   Assessment: Review of patient past medical history, allergies, medications, health status, including review of consultants reports, laboratory and other test data, was performed as part of comprehensive evaluation and provision of chronic care management services.   SDOH (Social Determinants of Health) assessments and interventions performed:    CCM Care Plan  Allergies  Allergen Reactions   Atorvastatin     REACTION: myalgias   Rosuvastatin     REACTION: myalgias   Sulfonamide Derivatives     Unknown reaction     Outpatient Encounter Medications as of 11/20/2021  Medication Sig   Ascorbic Acid (VITAMIN C) 1000 MG tablet Take 1,000 mg by mouth daily.    Calcium 500-125 MG-UNIT TABS Take 1 tablet by mouth daily.   cholecalciferol (VITAMIN D) 1000 UNITS tablet Take 1,000 Units by mouth daily.   feeding supplement, ENSURE COMPLETE, (ENSURE COMPLETE) LIQD Take 237 mLs by mouth 3 (three) times daily between meals.   ibuprofen (ADVIL) 600 MG tablet Take 600 mg by mouth every 6 (six) hours as needed.   lansoprazole (PREVACID) 30 MG capsule Take 30 mg by mouth daily with breakfast.    Pitavastatin Calcium (LIVALO) 2 MG TABS TAKE  1 TABLET BY MOUTH EVERY DAY   Potassium Chloride ER 20 MEQ TBCR TAKE 1 TABLET BY MOUTH EVERY DAY   rivastigmine (EXELON) 1.5 MG capsule Take 1 capsule (1.5 mg total) by mouth 2 (two) times daily.   sertraline (ZOLOFT) 25 MG tablet Take 1 tablet (25 mg total) by mouth daily.   No facility-administered encounter medications on file as of 11/20/2021.    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[place]) 04/03/2021   Pre-op examination 04/03/2021   Hyperlipidemia 07/03/2020   Mild neurocognitive disorder, 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   Bradycardia 04/29/2016   Hypokalemia 11/30/2015   Neutropenia ([place]) 05/03/2015   Syncope 09/11/2014   Bronchitis 09/08/2014   Multiple myeloma ([place]) 10/22/2013   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   Hyperproteinemia 04/14/2013   Midsternal chest pain 04/20/2012   Lipoma of shoulder 11/02/2011   Herpes zoster 11/05/2010   GERD (gastroesophageal reflux disease) 09/21/2010   Cervical Radiculopathy, Left 02/01/2010   Hypercholesterolemia 07/03/2009   Otitis Externa 07/03/2009   Cerumen impaction 07/03/2009   Myalgia 07/03/2009   Allergic rhinitis 01/02/2009   Diet-controlled diabetes mellitus ([place]) 03/18/2008   Chest pain 12/24/2007   URI 12/12/2007   Anemia-Iron Deficiency 04/24/2007   Essential hypertension 04/24/2007   Osteoporosis 04/24/2007   Esophageal Stricture 04/15/2007   Hiatal Hernia 04/15/2007    Conditions to be addressed/monitored:HTN, HLD, and Dementia  Care Plan : RN Care Manager Plan of Care  Updates made by Victoria Rued, RN since 11/20/2021 12:00 AM     Problem: No plan of care for Managment of Chronic Condition Dementia in a patient with HTN, HLD   Priority: High     Long-Range Goal: Developement of plan of care for Management of  Dementia   Start Date: 07/27/2021  Expected End Date: 03/20/2022  Priority: High  Note:   Current Barriers: Patient with history of HTN, 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 RNCM spoke with patient's husband, primary caregiver and DPR. Per Mr. Victoria Gates, "She is doing quite well". He reports she is not having any difficulty with agitation, and states, Mrs. Victoria Gates is much better than a year ago. He attributes the turn around to 1) having the dental work that she needed completed 2) addition of Ensure nutritional supplement 3) her sisters coming daily to spend time with her and 4) daily long rides, where Mr. Victoria Gates will drive her around and then her sisters will drive her around. He reports she keeps the same routine daily. He reports as he drives her around she will remember things such as where she used to work. He denies any questions or concerns at this time. Care Coordination needs related to Lacks knowledge of community resource: for patient's with dementia and personal care services/day programs Chronic Disease Management support and education needs related to HTN, HLD, and Dementia  RNCM Clinical Goal(s):  Patient will verbalize understanding of plan for management of HTN, HLD, and Dementia work with community resource care guide to address needs related to  [employer] knowledge of community resource: for patient's with dementia and personal care services/day programs    through collaboration with Consulting civil engineer, provider, and care team.   Interventions: 1:1 collaboration with primary care provider regarding development and update of comprehensive plan of care as evidenced by provider attestation and co-signature Inter-disciplinary care team collaboration (see longitudinal plan of care) Evaluation of current treatment plan related to  self management and patient's adherence to plan as established by provider Discussed caregiver needs-denies any problems  Dementia:  (Status:   Goal on track:  Yes.)  Long Term Goal Advised to contact provider for new or worsening symptoms  Encouraged to continue to take medications as prescribed Encouraged to continue patient's daily routine Upcoming appointments reviewed Provided positive feedback to Mr. Prescott regarding management of patient health conditions Discussed safety strategies in place Provided positive feedback to Mr. Torti regarding management of patient health conditions Discussed importance of caregiver self-care with Mr. Goodson   Hypertension Interventions: Goal: on track Home BP reading 11/19/21 120/75 BP Readings from Last 3 Encounters:  09/19/21 105/70  09/12/21 110/60  07/12/21 118/60  Most recent eGFR/CrCl:  Lab Results  Component Value Date   EGFR >60 08/13/2017    No components found for: CRCL  Reviewed medications with patient and discussed importance of compliance Discussed plans with patient for ongoing care management follow up and provided patient with direct contact information for care management team  Hyperlipidemia Interventions: Goal: on track Medication review performed; medication list updated in electronic medical record.  Encouraged to continue to follow up with provider appointments as scheduled Encouraged to continue to take medications as recommended.  Patient Goals/Self-Care Activities: Continue to take medications as prescribed. Continue Safe environment strategies: personal care assistance and supervision. Evaluate your kitchen for potential safety hazards- use appliances that have an automatic shut-off feature, apply stove knob covers,  remove knobs or turn off gas when stove is not in use, disconnect the garbage disposal. Evaluate your home for hazardous equipment/chemicals-Keep out of reach for example medications, alcohol, matches sharp objects, cleaning products, laundry pacs, bleach and other harmful chemicals. Contact Alzheimer's Association for more information [phone].    Continue to allow patient independence with self care activities for patient as long as they are safe to do.  Continue to minimize situations that promote anxiety or agitation. Contact provider for questions or concerns Contact your nurse Care Manager as needed for ongoing care coordination or care management needs.    Plan:Telephone follow up appointment with care management team member scheduled for:  12/18/21 The patient has been provided with contact information for the care management team and has been advised to call with any health related questions or concerns.   Thea Silversmith, RN, MSN, BSN, CCM Care Management Coordinator [facility] [phone]

## 2021-11-20 NOTE — Patient Instructions (Signed)
Visit Information  Thank you for taking time to visit with me today. Please don't hesitate to contact me if I can be of assistance to you before our next scheduled telephone appointment.  Following are the goals we discussed today:  Patient Goals/Self-Care Activities: Continue to take medications as prescribed. Continue Safe environment strategies: personal care assistance and supervision. Evaluate your kitchen for potential safety hazards- use appliances that have an automatic shut-off feature, apply stove knob covers, remove knobs or turn off gas when stove is not in use, disconnect the garbage disposal. Evaluate your home for hazardous equipment/chemicals-Keep out of reach for example medications, alcohol, matches sharp objects, cleaning products, laundry pacs, bleach and other harmful chemicals. Contact Alzheimer's Association for more information [phone].   Continue to allow patient independence with self care activities for patient as long as they are safe to do.  Continue to minimize situations that promote anxiety or agitation. Contact provider for questions or concerns Contact your nurse Care Manager as needed for ongoing care coordination or care management needs.  Our next appointment is by telephone on 12/18/21 at 10:45  Please call the care guide team at [phone] if you need to cancel or reschedule your appointment.   If you are experiencing a Mental Health or [place] or need someone to talk to, please call the Suicide and Crisis Lifeline: 988 call 1-800-273-TALK (toll free, 24 hour hotline)   Patient verbalizes understanding of instructions and care plan provided today and agrees to view in [place]. Active MyChart status confirmed with patient.    Thea Silversmith, RN, MSN, BSN, CCM Care Management Coordinator [facility] [phone]

## 2021-11-27 DIAGNOSIS — F0390 Unspecified dementia without behavioral disturbance: Secondary | ICD-10-CM | POA: Diagnosis not present

## 2021-11-27 DIAGNOSIS — I10 Essential (primary) hypertension: Secondary | ICD-10-CM

## 2021-11-27 DIAGNOSIS — E785 Hyperlipidemia, unspecified: Secondary | ICD-10-CM

## 2021-12-05 ENCOUNTER — Telehealth: Payer: Self-pay | Admitting: Neurology

## 2021-12-05 NOTE — Telephone Encounter (Signed)
Pt's husband called in stating he thinks the pt's rivastigmine and sertraline need to be increased. Monday the patient wandered outside.  ?

## 2021-12-06 MED ORDER — SERTRALINE HCL 50 MG PO TABS: 50.0000 mg | ORAL_TABLET | Freq: Every day | ORAL | 3 refills | Status: DC

## 2021-12-06 NOTE — Telephone Encounter (Signed)
I would not make too many medication changes at the same time, so let's increase sertraline to '50mg'$  daily.   ?

## 2021-12-06 NOTE — Telephone Encounter (Signed)
Called and spoke to patients husband and informed him that Dr. Posey Pronto would not make too many medication changes at the same time, so let's increase sertraline to 50 mg daily. Informed patients husband that rx has been sent to pharmacy. Patients husband verbalized understanding and had no further questions or concerns.  ?

## 2021-12-18 ENCOUNTER — Ambulatory Visit (INDEPENDENT_AMBULATORY_CARE_PROVIDER_SITE_OTHER): Payer: BC Managed Care – PPO

## 2021-12-18 DIAGNOSIS — I10 Essential (primary) hypertension: Secondary | ICD-10-CM

## 2021-12-18 DIAGNOSIS — E785 Hyperlipidemia, unspecified: Secondary | ICD-10-CM

## 2021-12-18 DIAGNOSIS — F0280 Dementia in other diseases classified elsewhere without behavioral disturbance: Secondary | ICD-10-CM

## 2021-12-18 NOTE — Chronic Care Management (AMB) (Signed)
?Chronic Care Management  ? ?CCM RN Visit Note ? ?12/18/2021 ?Name: Victoria Gates MRN: [id number] DOB: January 12, 1951 ? ?Subjective: ?Victoria Gates is a 71 y.o. year old female who is a primary care patient of Ann Held, DO. The care management team was consulted for assistance with disease management and care coordination needs.   ? ?Engaged with patient by telephone for follow up visit in response to provider referral for case management and/or care coordination services.  ? ?Consent to Services:  ?The patient was given information about Chronic Care Management services, agreed to services, and gave verbal consent prior to initiation of services.  Please see initial visit note for detailed documentation.  ? ?Patient agreed to services and verbal consent obtained.  ? ?Assessment: Review of patient past medical history, allergies, medications, health status, including review of consultants reports, laboratory and other test data, was performed as part of comprehensive evaluation and provision of chronic care management services.  ? ?SDOH (Social Determinants of Health) assessments and interventions performed:   ? ?CCM Care Plan ? ?Allergies  ?Allergen Reactions  ? Atorvastatin   ?  REACTION: myalgias  ? Rosuvastatin   ?  REACTION: myalgias  ? Sulfonamide Derivatives   ?  Unknown reaction   ? ? ?Outpatient Encounter Medications as of 12/18/2021  ?Medication Sig  ? Ascorbic Acid (VITAMIN C) 1000 MG tablet Take 1,000 mg by mouth daily.   ? Calcium 500-125 MG-UNIT TABS Take 1 tablet by mouth daily.  ? cholecalciferol (VITAMIN D) 1000 UNITS tablet Take 1,000 Units by mouth daily.  ? feeding supplement, ENSURE COMPLETE, (ENSURE COMPLETE) LIQD Take 237 mLs by mouth 3 (three) times daily between meals.  ? ibuprofen (ADVIL) 600 MG tablet Take 600 mg by mouth every 6 (six) hours as needed.  ? lansoprazole (PREVACID) 30 MG capsule Take 30 mg by mouth daily with breakfast.   ? Pitavastatin Calcium (LIVALO) 2 MG TABS TAKE  1 TABLET BY MOUTH EVERY DAY  ? Potassium Chloride ER 20 MEQ TBCR TAKE 1 TABLET BY MOUTH EVERY DAY  ? rivastigmine (EXELON) 1.5 MG capsule Take 1 capsule (1.5 mg total) by mouth 2 (two) times daily.  ? sertraline (ZOLOFT) 50 MG tablet Take 1 tablet (50 mg total) by mouth daily.  ? ?No facility-administered encounter medications on file as of 12/18/2021.  ? ? ?Patient Active Problem List  ? Diagnosis Date Noted  ? Preventative health care 07/12/2021  ? Hyperlipidemia associated with type 2 diabetes mellitus ([place]) 07/12/2021  ? 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 Pre-op examination 04/03/2021  ? Hyperlipidemia 07/03/2020  ? Mild neurocognitive disorder, 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  ? Bradycardia 04/29/2016  ? Hypokalemia 11/30/2015  ? Neutropenia ([place]) 05/03/2015  ? Syncope 09/11/2014  ? Bronchitis 09/08/2014  ? Multiple myeloma ([place]) 10/22/2013  ? 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  ? Hyperproteinemia 04/14/2013  ? Midsternal chest pain 04/20/2012  ? Lipoma of shoulder 11/02/2011  ? Herpes zoster 11/05/2010  ? GERD (gastroesophageal reflux disease) 09/21/2010  ? Cervical Radiculopathy, Left 02/01/2010  ? Hypercholesterolemia 07/03/2009  ? Otitis Externa 07/03/2009  ? Cerumen impaction 07/03/2009  ? Myalgia 07/03/2009  ? Allergic rhinitis 01/02/2009  ? Diet-controlled diabetes mellitus ([place]) 03/18/2008  ? Chest pain 12/24/2007  ? URI 12/12/2007  ? Anemia-Iron Deficiency 04/24/2007  ? Essential hypertension 04/24/2007  ? Osteoporosis 04/24/2007  ? Esophageal Stricture 04/15/2007  ? Hiatal Hernia 04/15/2007  ? ? ?Conditions to be addressed/monitored:HTN, HLD, and Dementia ? ?  Care Plan : RN Care Manager Plan of Care  ?Updates made by Luretha Rued, RN since 12/18/2021 12:00 AM  ?  ? ?Problem: No plan of care for Managment of Chronic Condition Dementia in a patient with HTN, HLD   ?Priority: High  ?  ? ?Long-Range Goal: Developement of plan of care for Management of  Dementia   ?Start Date: 07/27/2021  ?Expected End Date: 03/20/2022  ?Priority: High  ?Note:   ?Current Barriers:  Patient with history of HTN, 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 RNCM spoke with Mr. Stigler, primary caregiver/DPR. He reports sertraline increased to 50 mg daily per neurologist, after Mrs. Cumpton was noted to be wandering in driveway. He reports Mrs. Bazzle now has 24/7 supervision. He denies any questions or concerns at this time.   ?Care Coordination needs related to Lacks knowledge of community resource: for patient's with dementia and personal care services/day programs ?Chronic Disease Management support and education needs related to HTN, HLD, and Dementia ? ?RNCM Clinical Goal(s):  ?Patient will verbalize understanding of plan for management of HTN, HLD, and Dementia ?work with community resource care guide to address needs related to  [employer] of community resource: for patient's with dementia and personal care services/day programs    through collaboration with Consulting civil engineer, provider, and care team.  ? ?Interventions: ?1:1 collaboration with primary care provider regarding development and update of comprehensive plan of care as evidenced by provider attestation and co-signature ?Inter-disciplinary care team collaboration (see longitudinal plan of care) ?Evaluation of current treatment plan related to  self management and patient's adherence to plan as established by provider ?Discussed caregiver needs-denies any problems ? ?Dementia:  (Status:  Goal on track:  Yes.)  Long Term Goal ?Advised to contact provider for new or worsening symptoms  ?Encouraged to continue to take medications as prescribed ?Encouraged to continue patient's daily routine ?Upcoming appointments reviewed ?Discussed safety strategies in place ?Reiterated the importance of caregiver self-care with Mr. Labarbera  ?Provided positive feedback to Mr. Christy regarding management of patient health  conditions ? ?Hypertension Interventions: Goal: on track ?Latest Home BP taken last week: BP 125/83 ?BP Readings from Last 3 Encounters:  ?09/19/21 105/70  ?09/12/21 110/60  ?07/12/21 118/60  ?Most recent eGFR/CrCl:  ?Lab Results  ?Component Value Date  ? EGFR >60 08/13/2017  ?  No components found for: CRCL ? ?Reviewed medications with patient and discussed importance of compliance ?Discussed plans with patient for ongoing care management follow up and provided patient with direct contact information for care management team ? ?Hyperlipidemia Interventions: Goal: on track ?Medications reviewed and encouraged to continue to take as prescribed ?Discussed Healthy eating. Avoid foods high in saturated fats and transfats ?Encouraged to continue to attend provider appointments as scheduled ?Reviewed upcoming appointments ?Encouraged to continue to take medications as recommended. ? ?Patient Goals/Self-Care Activities: ?Continue to take medications as prescribed. ?Continue Safe environment strategies: personal care assistance and supervision. Evaluate your kitchen for potential safety hazards- use appliances that have an automatic shut-off feature, apply stove knob covers, remove knobs or turn off gas when stove is not in use, disconnect the garbage disposal. Evaluate your home for hazardous equipment/chemicals-Keep out of reach for example medications, alcohol, matches sharp objects, cleaning products, laundry pacs, bleach and other harmful chemicals. Contact Alzheimer's Association for more information [phone].   ?Continue to minimize situations that promote anxiety or agitation. ?Contact provider for healthcare questions or concerns ?Contact your nurse Care Manager as needed for  ongoing care coordination or care management needs. ? ?  ?Plan:Telephone follow up appointment with care management team member scheduled for:  02/12/22 ?The patient has been provided with contact information for the care management team  and has been advised to call with any health related questions or concerns.  ? ?Thea Silversmith, RN, MSN, BSN, CCM ?Care Management Coordinator ?[facility] [place] ?[phone]  ?

## 2021-12-18 NOTE — Patient Instructions (Signed)
Visit Information ? ?Thank you for taking time to visit with me today. Please don't hesitate to contact me if I can be of assistance to you before our next scheduled telephone appointment. ? ?Following are the goals we discussed today:  ?Patient Goals/Self-Care Activities: ?Continue to take medications as prescribed. ?Continue Safe environment strategies: personal care assistance and supervision. Evaluate your kitchen for potential safety hazards- use appliances that have an automatic shut-off feature, apply stove knob covers, remove knobs or turn off gas when stove is not in use, disconnect the garbage disposal. Evaluate your home for hazardous equipment/chemicals-Keep out of reach for example medications, alcohol, matches sharp objects, cleaning products, laundry pacs, bleach and other harmful chemicals. Contact Alzheimer's Association for more information [phone].   ?Continue to minimize situations that promote anxiety or agitation. ?Contact provider for healthcare questions or concerns ?Contact your nurse Care Manager as needed for ongoing care coordination or care management needs. ? ?Our next appointment is by telephone on 02/12/22 at 10:45 am ? ?Please call the care guide team at [phone] if you need to cancel or reschedule your appointment.  ? ?If you are experiencing a Mental Health or [place] or need someone to talk to, please call the Suicide and Crisis Lifeline: 988 ?call 1-800-273-TALK (toll free, 24 hour hotline)  ? ?Patient verbalizes understanding of instructions and care plan provided today and agrees to view in [place]. Active MyChart status confirmed with patient.   ? ?Thea Silversmith, RN, MSN, BSN, CCM ?Care Management Coordinator ?[facility] [place] ?[phone]  ?

## 2021-12-28 DIAGNOSIS — G3109 Other frontotemporal dementia: Secondary | ICD-10-CM | POA: Diagnosis not present

## 2021-12-28 DIAGNOSIS — I10 Essential (primary) hypertension: Secondary | ICD-10-CM | POA: Diagnosis not present

## 2021-12-28 DIAGNOSIS — F0280 Dementia in other diseases classified elsewhere without behavioral disturbance: Secondary | ICD-10-CM | POA: Diagnosis not present

## 2021-12-28 DIAGNOSIS — E785 Hyperlipidemia, unspecified: Secondary | ICD-10-CM

## 2022-01-03 ENCOUNTER — Telehealth: Payer: Self-pay | Admitting: Neurology

## 2022-01-03 MED ORDER — SERTRALINE HCL 50 MG PO TABS
75.0000 mg | ORAL_TABLET | Freq: Every day | ORAL | 3 refills | Status: DC
Start: 2022-01-03 — End: 2022-01-14

## 2022-01-03 NOTE — Telephone Encounter (Signed)
We can increase sertraline to '50mg'$  for behavior.  As for diet, best to discuss this with PCP so they can make appropriate referral.  ?

## 2022-01-03 NOTE — Telephone Encounter (Signed)
Patient's son Dr. Martha Clan called with concerns about the patient's diet and he is requesting a consult about nutrition as she is only taking in Ensure drinks at this time. ? ?He also said she is a little more active than they can handle with respect to her dementia and he'd like to discuss medications. ?

## 2022-01-03 NOTE — Telephone Encounter (Signed)
Called and spoke to patients son Dr. Clotilde Dieter. Informed him of Dr. Serita Grit recommendations. Patients son stated that his mom has already been on Sertraline 50 mg since the last time they saw Dr. Posey Pronto and forgot to mention it. He thinks maybe it needs to be upped to 75 mg or 100 mg. Patients son was provided with PCP contact information Dr. Carollee Herter [phone] to call about patients diet. ? ?Patients son is aware that I will send Dr. Posey Pronto a message and give him a call on the Sertraline dosage.  ?

## 2022-01-03 NOTE — Telephone Encounter (Signed)
Called and spoke to patients son Dr. Clotilde Dieter. Informed him that Dr. Posey Pronto will increase Sertraline to 75 mg daily and an updated Rx has been sent to patients pharmacy. Patients son verbalized understanding and had no further questions or concerns.  ?

## 2022-01-03 NOTE — Telephone Encounter (Signed)
OK to increase to sertraline '75mg'$  daily - take 1.5 tab of the '50mg'$  tablets. I will send updated prescription. ?

## 2022-01-14 ENCOUNTER — Ambulatory Visit (INDEPENDENT_AMBULATORY_CARE_PROVIDER_SITE_OTHER): Payer: BC Managed Care – PPO | Admitting: Family Medicine

## 2022-01-14 ENCOUNTER — Encounter: Payer: Self-pay | Admitting: Family Medicine

## 2022-01-14 VITALS — BP 124/82 | HR 73 | Resp 18 | Ht 68.0 in | Wt 154.8 lb

## 2022-01-14 DIAGNOSIS — G3109 Other frontotemporal dementia: Secondary | ICD-10-CM

## 2022-01-14 DIAGNOSIS — E441 Mild protein-calorie malnutrition: Secondary | ICD-10-CM | POA: Insufficient documentation

## 2022-01-14 DIAGNOSIS — C9001 Multiple myeloma in remission: Secondary | ICD-10-CM

## 2022-01-14 DIAGNOSIS — I10 Essential (primary) hypertension: Secondary | ICD-10-CM

## 2022-01-14 DIAGNOSIS — F418 Other specified anxiety disorders: Secondary | ICD-10-CM

## 2022-01-14 DIAGNOSIS — E785 Hyperlipidemia, unspecified: Secondary | ICD-10-CM | POA: Diagnosis not present

## 2022-01-14 DIAGNOSIS — E876 Hypokalemia: Secondary | ICD-10-CM | POA: Diagnosis not present

## 2022-01-14 DIAGNOSIS — F0280 Dementia in other diseases classified elsewhere without behavioral disturbance: Secondary | ICD-10-CM

## 2022-01-14 LAB — COMPREHENSIVE METABOLIC PANEL
ALT: 54 U/L — ABNORMAL HIGH (ref 0–35)
AST: 38 U/L — ABNORMAL HIGH (ref 0–37)
Albumin: 4 g/dL (ref 3.5–5.2)
Alkaline Phosphatase: 76 U/L (ref 39–117)
BUN: 17 mg/dL (ref 6–23)
CO2: 28 mEq/L (ref 19–32)
Calcium: 9.4 mg/dL (ref 8.4–10.5)
Chloride: 100 mEq/L (ref 96–112)
Creatinine, Ser: 0.85 mg/dL (ref 0.40–1.20)
GFR: 69.02 mL/min (ref >60.00)
Glucose, Bld: 105 mg/dL — ABNORMAL HIGH (ref 70–99)
Potassium: 3.7 mEq/L (ref 3.5–5.1)
Sodium: 136 mEq/L (ref 135–145)
Total Bilirubin: 0.8 mg/dL (ref 0.2–1.2)
Total Protein: 7.6 g/dL (ref 6.0–8.3)

## 2022-01-14 LAB — LIPID PANEL
Cholesterol: 147 mg/dL (ref 0–200)
HDL: 31.3 mg/dL — ABNORMAL LOW (ref >39.00)
LDL Cholesterol: 83 mg/dL (ref 0–99)
NonHDL: 115.21
Total CHOL/HDL Ratio: 5
Triglycerides: 160 mg/dL — ABNORMAL HIGH (ref 0.0–149.0)
VLDL: 32 mg/dL (ref 0.0–40.0)

## 2022-01-14 LAB — CBC WITH DIFFERENTIAL/PLATELET
Basophils Absolute: 0.1 10*3/uL (ref 0.0–0.1)
Basophils Relative: 0.8 % (ref 0.0–3.0)
Eosinophils Absolute: 0.2 10*3/uL (ref 0.0–0.7)
Eosinophils Relative: 2.8 % (ref 0.0–5.0)
HCT: 39.8 % (ref 36.0–46.0)
Hemoglobin: 13.4 g/dL (ref 12.0–15.0)
Lymphocytes Relative: 37.8 % (ref 12.0–46.0)
Lymphs Abs: 2.6 10*3/uL (ref 0.7–4.0)
MCHC: 33.6 g/dL (ref 30.0–36.0)
MCV: 85.1 fl (ref 78.0–100.0)
Monocytes Absolute: 0.5 10*3/uL (ref 0.1–1.0)
Monocytes Relative: 7.8 % (ref 3.0–12.0)
Neutro Abs: 3.5 10*3/uL (ref 1.4–7.7)
Neutrophils Relative %: 50.8 % (ref 43.0–77.0)
Platelets: 157 10*3/uL (ref 150.0–400.0)
RBC: 4.68 Mil/uL (ref 3.87–5.11)
RDW: 13.1 % (ref 11.5–15.5)
WBC: 6.9 10*3/uL (ref 4.0–10.5)

## 2022-01-14 MED ORDER — MIRTAZAPINE 15 MG PO TABS: 15.0000 mg | ORAL_TABLET | Freq: Every day | ORAL | 1 refills | Status: DC

## 2022-01-14 MED ORDER — POTASSIUM CHLORIDE ER 20 MEQ PO TBCR: 1.0000 | EXTENDED_RELEASE_TABLET | Freq: Every day | ORAL | 1 refills | Status: DC

## 2022-01-14 NOTE — Assessment & Plan Note (Signed)
Per oncology °

## 2022-01-14 NOTE — Assessment & Plan Note (Signed)
Well controlled, no changes to meds. Encouraged heart healthy diet such as the DASH diet and exercise as tolerated.  °

## 2022-01-14 NOTE — Assessment & Plan Note (Signed)
Worsening ---- husband does have someone stay with her during the day ?Nutrition referral placed at family request ?Pt only driinking ensure  ?

## 2022-01-14 NOTE — Patient Instructions (Signed)
High-Protein and High-Calorie Diet Eating high-protein and high-calorie foods can help you to gain weight, heal after an injury, and recover after an illness or surgery. The specific amount of daily protein and calories you need depends on: Your body weight. The reason this diet is recommended for you. Generally, a high-protein, high-calorie diet involves: Eating 250-500 extra calories each day. Making sure that you get enough of your daily calories from protein. Ask your health care provider how many of your calories should come from protein. Talk with a health care provider or a dietitian about how much protein and how many calories you need each day. Follow the diet as directed by your health care provider. What are tips for following this plan?  Reading food labels Check the nutrition facts label for calories, grams of fat and protein. Items with more than 4 grams of protein are high-protein foods. Preparing meals Add whole milk, half-and-half, or heavy cream to cereal, pudding, soup, or hot cocoa. Add whole milk to instant breakfast drinks. Add peanut butter to oatmeal or smoothies. Add powdered milk to baked goods, smoothies, or milkshakes. Add powdered milk, cream, or butter to mashed potatoes. Add cheese to cooked vegetables. Make whole-milk yogurt parfaits. Top them with granola, fruit, or nuts. Add cottage cheese to fruit. Add avocado, cheese, or both to sandwiches or salads. Add avocado to smoothies. Add meat, poultry, or seafood to rice, pasta, casseroles, salads, and soups. Use mayonnaise when making egg salad, chicken salad, or tuna salad. Use peanut butter as a dip for fruits and vegetables or as a topping for pretzels, celery, or crackers. Add beans to casseroles, dips, and spreads. Add pureed beans to sauces and soups. Replace calorie-free drinks with calorie-containing drinks, such as milk and fruit juice. Replace water with milk or heavy cream when making foods such as  oatmeal, pudding, or cocoa. Add oil or butter to cooked vegetables and grains. Add cream cheese to sandwiches or as a topping on crackers and bread. Make cream-based pastas and soups. General information Ask your health care provider if you should take a nutritional supplement. Try to eat six small meals each day instead of three large meals. A general goal is to eat every 2 to 3 hours. Eat a balanced diet. In each meal, include one food that is high in protein and one food with fat in it. Keep nutritious snacks available, such as nuts, trail mixes, dried fruit, and yogurt. If you have kidney disease or diabetes, talk with your health care provider about how much protein is safe for you. Too much protein may put extra stress on your kidneys. Drink your calories. Choose high-calorie drinks and have them after your meals. Consider setting a timer to remind you to eat. You will want to eat even if you do not feel very hungry. What high-protein foods should I eat?  Vegetables Soybeans. Peas. Grains Quinoa. Bulgur wheat. Buckwheat. Meats and other proteins Beef, pork, and poultry. Fish and seafood. Eggs. Tofu. Textured vegetable protein (TVP). Peanut butter. Nuts and seeds. Dried beans. Protein powders. Hummus. Dairy Whole milk. Whole-milk yogurt. Powdered milk. Cheese. Cottage Cheese. Eggnog. Beverages High-protein supplement drinks. Soy milk. Other foods Protein bars. The items listed above may not be a complete list of foods and beverages you can eat and drink. Contact a dietitian for more information. What high-calorie foods should I eat? Fruits Dried fruit. Fruit leather. Canned fruit in syrup. Fruit juice. Avocado. Vegetables Vegetables cooked in oil or butter. Fried potatoes. Grains   Pasta. Quick breads. Muffins. Pancakes. Ready-to-eat cereal. Meats and other proteins Peanut butter. Nuts and seeds. Dairy Heavy cream. Whipped cream. Cream cheese. Sour cream. Ice cream. Custard.  Pudding. Whole milk dairy products. Beverages Meal-replacement beverages. Nutrition shakes. Fruit juice. Seasonings and condiments Salad dressing. Mayonnaise. Alfredo sauce. Fruit preserves or jelly. Honey. Syrup. Sweets and desserts Cake. Cookies. Pie. Pastries. Candy bars. Chocolate. Fats and oils Butter or margarine. Oil. Gravy. Other foods Meal-replacement bars. The items listed above may not be a complete list of foods and beverages you can eat and drink. Contact a dietitian for more information. Summary A high-protein, high-calorie diet can help you gain weight or heal faster after an injury, illness, or surgery. To increase your protein and calories, add ingredients such as whole milk, peanut butter, cheese, beans, meat, or seafood to meal items. To get enough extra calories each day, include high-calorie foods and beverages at each meal. Adding a high-calorie drink or shake can be an easy way to help you get enough calories each day. Talk with your healthcare provider or dietitian about the best options for you. This information is not intended to replace advice given to you by your health care provider. Make sure you discuss any questions you have with your health care provider. Document Revised: 08/20/2020 Document Reviewed: 08/20/2020 Elsevier Patient Education  2023 Elsevier Inc.  

## 2022-01-14 NOTE — Progress Notes (Signed)
? ?Subjective:  ? ?By signing my name below, I, [name], attest that this documentation has been prepared under the direction and in the presence of Roma Schanz DO, 01/14/2022   ? ? Patient ID: Victoria Gates, female    DOB: 03-Feb-1951, 71 y.o.   MRN: [id number] ? ?Chief Complaint  ?Patient presents with  ? Hypertension  ? Hyperlipidemia  ? Diabetes  ? Follow-up  ? ? ?HPI ?Patient is in today for an office visit. She is accompanied by her partner. ? ?She has not been gaining weight. Her partner reports that she has not been eating many meals but has been drinking Ensure everyday. She often has a nurse to accompany her at home. Her son is requesting for her to see a nutritionist.  ?Wt Readings from Last 3 Encounters:  ?01/14/22 154 lb 12.8 oz (70.2 kg)  ?09/19/21 154 lb 14.4 oz (70.3 kg)  ?09/12/21 154 lb 9.6 oz (70.1 kg)  ?  ? ?Past Medical History:  ?Diagnosis Date  ? Allergic rhinitis 01/02/2009  ? Bradycardia 04/29/2016  ? Cervical Radiculopathy 02/01/2010  ? Left side  ? Chest pain 04/15/2012  ? Ex MV: Ex time 10 mins, EF 63%, no ischemia/infarct. Occas PAC's/PVC's.  ? Diabetes mellitus, type II 03/18/2008  ? Encounter for antineoplastic chemotherapy 04/29/2016  ? Esophageal Stricture 04/15/2007  ? s/p dilitation  ? GERD (gastroesophageal reflux disease) 09/21/2010  ? Herpes zoster 11/05/2010  ? Hiatal Hernia 04/15/2007  ? History of colonic polyps 04/24/2007  ? Hyperplastic only  ? Hypercholesterolemia 07/03/2009  ? Hypokalemia 11/30/2015  ? Iron deficiency anemia 04/24/2007  ? Multiple myeloma ([place]) 02/2014  ? Osteoporosis 04/24/2007  ? ? ?Past Surgical History:  ?Procedure Laterality Date  ? ESOPHAGOGASTRODUODENOSCOPY  04/15/2007  ? TUBAL LIGATION    ? ? ?Family History  ?Problem Relation Age of Onset  ? Brain cancer Mother   ? Heart disease Mother   ? Lung cancer Father   ? Heart disease Father   ? Diabetes Sister   ? Heart disease Sister   ? Heart attack Sister   ? ALS Sister   ? Diabetes Brother    ? Heart disease Brother   ? Cancer Neg Hx   ?     No FH of Colon Cancer  ? Colon cancer Neg Hx   ? Esophageal cancer Neg Hx   ? Rectal cancer Neg Hx   ? Stomach cancer Neg Hx   ? ? ?Social History  ? ?Socioeconomic History  ? Marital status: Married  ?  Spouse name: Not on file  ? Number of children: Not on file  ? Years of education: Not on file  ? Highest education level: Not on file  ?Occupational History  ? Occupation: MAIL CLERK  ?  Employer: [employer]  ?Tobacco Use  ? Smoking status: Never  ? Smokeless tobacco: Never  ?Vaping Use  ? Vaping Use: Never used  ?Substance and Sexual Activity  ? Alcohol use: No  ? Drug use: No  ? Sexual activity: Yes  ?Other Topics Concern  ? Not on file  ?Social History Narrative  ? Lives in [place] with spouse.  ? 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  ? Right Handed  ? ?Social Determinants of Health  ? ?Financial Resource Strain: Not on file  ?Food Insecurity: No Food Insecurity  ? Worried About Charity fundraiser in the Last Year: Never true  ? Ran Out of Food in the  Last Year: Never true  ?Transportation Needs: No Transportation Needs  ? Lack of Transportation (Medical): No  ? Lack of Transportation (Non-Medical): No  ?Physical Activity: Not on file  ?Stress: Not on file  ?Social Connections: Not on file  ?Intimate Partner Violence: Not on file  ? ? ?Outpatient Medications Prior to Visit  ?Medication Sig Dispense Refill  ? Ascorbic Acid (VITAMIN C) 1000 MG tablet Take 1,000 mg by mouth daily.     ? Calcium 500-125 MG-UNIT TABS Take 1 tablet by mouth daily.    ? cholecalciferol (VITAMIN D) 1000 UNITS tablet Take 1,000 Units by mouth daily.    ? feeding supplement, ENSURE COMPLETE, (ENSURE COMPLETE) LIQD Take 237 mLs by mouth 3 (three) times daily between meals.    ? ibuprofen (ADVIL) 600 MG tablet Take 600 mg by mouth every 6 (six) hours as needed.    ? lansoprazole (PREVACID) 30 MG capsule Take 30 mg by mouth daily with breakfast.     ? Pitavastatin  Calcium (LIVALO) 2 MG TABS TAKE 1 TABLET BY MOUTH EVERY DAY 90 tablet 1  ? rivastigmine (EXELON) 1.5 MG capsule Take 1 capsule (1.5 mg total) by mouth 2 (two) times daily. 180 capsule 3  ? Potassium Chloride ER 20 MEQ TBCR TAKE 1 TABLET BY MOUTH EVERY DAY 90 tablet 1  ? sertraline (ZOLOFT) 50 MG tablet Take 1.5 tablets (75 mg total) by mouth daily. 135 tablet 3  ? ?No facility-administered medications prior to visit.  ? ? ?Allergies  ?Allergen Reactions  ? Atorvastatin   ?  REACTION: myalgias  ? Rosuvastatin   ?  REACTION: myalgias  ? Sulfonamide Derivatives   ?  Unknown reaction   ? ? ?ROS ? ?   ?Objective:  ?  ?Physical Exam ?Constitutional:   ?   General: She is not in acute distress. ?   Appearance: Normal appearance. She is not ill-appearing.  ?HENT:  ?   Head: Normocephalic and atraumatic.  ?   Right Ear: External ear normal.  ?   Left Ear: External ear normal.  ?Eyes:  ?   Extraocular Movements: Extraocular movements intact.  ?   Pupils: Pupils are equal, round, and reactive to light.  ?Cardiovascular:  ?   Rate and Rhythm: Normal rate and regular rhythm.  ?   Heart sounds: Normal heart sounds. No murmur heard. ?  No gallop.  ?Pulmonary:  ?   Effort: Pulmonary effort is normal. No respiratory distress.  ?   Breath sounds: Normal breath sounds. No wheezing or rales.  ?Skin: ?   General: Skin is warm and dry.  ?Neurological:  ?   Mental Status: She is alert and oriented to person, place, and time.  ?Psychiatric:     ?   Judgment: Judgment normal.  ? ? ?BP 124/82 (BP Location: Right Arm, Patient Position: Sitting, Cuff Size: Normal)   Pulse 73   Resp 18   Ht $R'5\' 8"'vb$  (1.727 m)   Wt 154 lb 12.8 oz (70.2 kg)   SpO2 98%   BMI 23.54 kg/m?  ?Wt Readings from Last 3 Encounters:  ?01/14/22 154 lb 12.8 oz (70.2 kg)  ?09/19/21 154 lb 14.4 oz (70.3 kg)  ?09/12/21 154 lb 9.6 oz (70.1 kg)  ? ? ?Diabetic Foot Exam - Simple   ?No data filed ?  ? ?Lab Results  ?Component Value Date  ? WBC 7.0 09/12/2021  ? HGB 13.0  09/12/2021  ? HCT 39.9 09/12/2021  ? PLT 175 09/12/2021  ?  GLUCOSE 100 (H) 09/12/2021  ? CHOL 101 04/03/2021  ? TRIG 102.0 04/03/2021  ? HDL 25.30 (L) 04/03/2021  ? LDLDIRECT 127.0 10/11/2020  ? LDLCALC 56 04/03/2021  ? ALT 46 (H) 09/12/2021  ? AST 41 09/12/2021  ? NA 138 09/12/2021  ? K 3.9 09/12/2021  ? CL 104 09/12/2021  ? CREATININE 0.86 09/12/2021  ? BUN 15 09/12/2021  ? CO2 26 09/12/2021  ? TSH 4.49 12/08/2018  ? INR 1.22 05/09/2016  ? HGBA1C 5.7 (A) 09/12/2021  ? MICROALBUR <0.7 09/01/2017  ? ? ?Lab Results  ?Component Value Date  ? TSH 4.49 12/08/2018  ? ?Lab Results  ?Component Value Date  ? WBC 7.0 09/12/2021  ? HGB 13.0 09/12/2021  ? HCT 39.9 09/12/2021  ? MCV 85.3 09/12/2021  ? PLT 175 09/12/2021  ? ?Lab Results  ?Component Value Date  ? NA 138 09/12/2021  ? K 3.9 09/12/2021  ? CHLORIDE 106 08/13/2017  ? CO2 26 09/12/2021  ? GLUCOSE 100 (H) 09/12/2021  ? BUN 15 09/12/2021  ? CREATININE 0.86 09/12/2021  ? BILITOT 0.7 09/12/2021  ? ALKPHOS 68 09/12/2021  ? AST 41 09/12/2021  ? ALT 46 (H) 09/12/2021  ? PROT 8.3 (H) 09/12/2021  ? ALBUMIN 4.0 09/12/2021  ? CALCIUM 9.2 09/12/2021  ? ANIONGAP 8 09/12/2021  ? EGFR >60 08/13/2017  ? GFR 72.46 04/03/2021  ? ?Lab Results  ?Component Value Date  ? CHOL 101 04/03/2021  ? ?Lab Results  ?Component Value Date  ? HDL 25.30 (L) 04/03/2021  ? ?Lab Results  ?Component Value Date  ? LDLCALC 56 04/03/2021  ? ?Lab Results  ?Component Value Date  ? TRIG 102.0 04/03/2021  ? ?Lab Results  ?Component Value Date  ? CHOLHDL 4 04/03/2021  ? ?Lab Results  ?Component Value Date  ? HGBA1C 5.7 (A) 09/12/2021  ? ? ?   ?Assessment & Plan:  ? ?Problem List Items Addressed This Visit   ? ?  ? Unprioritized  ? Hypokalemia  ? Relevant Medications  ? Potassium Chloride ER 20 MEQ TBCR  ? Hyperlipidemia - Primary  ? Relevant Orders  ? Comprehensive metabolic panel  ? Lipid panel  ? CBC with Differential/Platelet  ? Multiple myeloma ([place])  ?  Per oncology ? ?  ?  ? Mild protein-calorie  malnutrition ([place])  ? Relevant Orders  ? Amb ref to Medical Nutrition Therapy-MNT  ? 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  Worsening ---- husband does have someone stay with her during the day ?Nutrition referral placed at family req

## 2022-01-20 ENCOUNTER — Other Ambulatory Visit: Payer: Self-pay | Admitting: Family Medicine

## 2022-02-05 ENCOUNTER — Other Ambulatory Visit: Payer: Self-pay | Admitting: Family Medicine

## 2022-02-05 DIAGNOSIS — F418 Other specified anxiety disorders: Secondary | ICD-10-CM

## 2022-02-12 ENCOUNTER — Ambulatory Visit (INDEPENDENT_AMBULATORY_CARE_PROVIDER_SITE_OTHER): Payer: BC Managed Care – PPO

## 2022-02-12 DIAGNOSIS — I10 Essential (primary) hypertension: Secondary | ICD-10-CM

## 2022-02-12 DIAGNOSIS — E785 Hyperlipidemia, unspecified: Secondary | ICD-10-CM

## 2022-02-12 NOTE — Patient Instructions (Signed)
Visit Information ? ?Thank you for taking time to visit with me today. Please don't hesitate to contact me if I can be of assistance to you before our next scheduled telephone appointment. ? ?Following are the goals we discussed today:  ?Patient Goals/Self-Care Activities: ?Continue to take medications as prescribed. ?Continue Safe environment strategies: personal care assistance and supervision. Continue to Monitor for safety hazards: kitchen potential safety hazards- use appliances that have an automatic shut-off feature, apply stove knob covers, remove knobs or turn off gas when stove is not in use, disconnect the garbage disposal. Monitor your home for hazardous equipment/chemicals-Keep out of reach for example medications, alcohol, matches sharp objects, cleaning products, laundry pacs, bleach and other harmful chemicals. Contact Alzheimer's Association for more information [phone].   ?Continue to keep usual daily routine ?Contact provider for healthcare questions or concerns ?Contact your nurse Care Manager as needed for ongoing care coordination or care management needs ? ?Our next appointment is by telephone on 05/16/22 at 10 am ? ?Please call the care guide team at [phone] if you need to cancel or reschedule your appointment.  ? ?If you are experiencing a Mental Health or [place] or need someone to talk to, please call the Suicide and Crisis Lifeline: 988 ?call 1-800-273-TALK (toll free, 24 hour hotline)  ? ?Patient verbalizes understanding of instructions and care plan provided today and agrees to view in [place]. Active MyChart status confirmed with patient.   ? ?Thea Silversmith, RN, MSN, BSN, CCM ?Care Management Coordinator ?[facility] ?[phone]  ?

## 2022-02-12 NOTE — Chronic Care Management (AMB) (Signed)
Chronic Care Management   CCM RN Visit Note  02/12/2022 Name: Victoria Gates MRN: [id number] DOB: 19-Mar-1951  Subjective: Victoria Gates is a 71 y.o. year old female who is a primary care patient of Victoria Held, DO. The care management team was consulted for assistance with disease management and care coordination needs.    Engaged with patient by telephone for follow up visit in response to provider referral for case management and/or care coordination services.   Consent to Services:  The patient was given information about Chronic Care Management services, agreed to services, and gave verbal consent prior to initiation of services.  Please see initial visit note for detailed documentation.   Patient agreed to services and verbal consent obtained.   Assessment: Review of patient past medical history, allergies, medications, health status, including review of consultants reports, laboratory and other test data, was performed as part of comprehensive evaluation and provision of chronic care management services.   SDOH (Social Determinants of Health) assessments and interventions performed:    CCM Care Plan  Allergies  Allergen Reactions   Atorvastatin     REACTION: myalgias   Rosuvastatin     REACTION: myalgias   Sulfonamide Derivatives     Unknown reaction     Outpatient Encounter Medications as of 02/12/2022  Medication Sig   Ascorbic Acid (VITAMIN C) 1000 MG tablet Take 1,000 mg by mouth daily.    Calcium 500-125 MG-UNIT TABS Take 1 tablet by mouth daily.   cholecalciferol (VITAMIN D) 1000 UNITS tablet Take 1,000 Units by mouth daily.   feeding supplement, ENSURE COMPLETE, (ENSURE COMPLETE) LIQD Take 237 mLs by mouth 3 (three) times daily between meals.   ibuprofen (ADVIL) 600 MG tablet Take 600 mg by mouth every 6 (six) hours as needed.   lansoprazole (PREVACID) 30 MG capsule Take 30 mg by mouth daily with breakfast.    LIVALO 2 MG TABS TAKE 1 TABLET BY MOUTH  EVERY DAY   mirtazapine (REMERON) 15 MG tablet TAKE 1 TABLET BY MOUTH EVERYDAY AT BEDTIME   Potassium Chloride ER 20 MEQ TBCR Take 1 tablet by mouth daily.   rivastigmine (EXELON) 1.5 MG capsule Take 1 capsule (1.5 mg total) by mouth 2 (two) times daily.   No facility-administered encounter medications on file as of 02/12/2022.    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[place]) 04/03/2021   Pre-op examination 04/03/2021   Hyperlipidemia 07/03/2020   Mild neurocognitive disorder, 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   Bradycardia 04/29/2016   Hypokalemia 11/30/2015   Neutropenia ([place]) 05/03/2015   Syncope 09/11/2014   Bronchitis 09/08/2014   Multiple myeloma ([place]) 10/22/2013   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   Hyperproteinemia 04/14/2013   Midsternal chest pain 04/20/2012   Lipoma of shoulder 11/02/2011   Herpes zoster 11/05/2010   GERD (gastroesophageal reflux disease) 09/21/2010   Cervical Radiculopathy, Left 02/01/2010   Hypercholesterolemia 07/03/2009   Otitis Externa 07/03/2009   Cerumen impaction 07/03/2009   Myalgia 07/03/2009   Allergic rhinitis 01/02/2009   Diet-controlled diabetes mellitus ([place]) 03/18/2008   Chest pain 12/24/2007   URI 12/12/2007   Anemia-Iron Deficiency 04/24/2007   Essential hypertension 04/24/2007   Osteoporosis 04/24/2007   Esophageal Stricture 04/15/2007   Hiatal Hernia 04/15/2007    Conditions to be addressed/monitored:HTN,  HLD, and Dementia  Care Plan : RN Care Manager Plan of Care  Updates made by Victoria Rued, RN since 02/12/2022 12:00 AM     Problem: No plan of care for Managment of Chronic Condition Dementia in a patient with HTN, HLD   Priority: High     Long-Range Goal: Developement of  plan of care for Management of Dementia   Start Date: 07/27/2021  Expected End Date: 05/16/2022  Priority: High  Note:   Current Barriers: Per Mr. Lowdermilk, [name] is doing well. He reports PCP visit on 01/14/22: medications changed to Mirtazapine and patient is calmer now. He also reports increased appetite from 154 lb on 01/14/22 to 162 lb today. [name] continues to have 24/7 supervision. Mr. Manske is without questions or concerns at this time. Care Coordination needs related to L[place] knowledge of community resource: for patient's with dementia and personal care services/day programs Chronic Disease Management support and education needs related to HTN, HLD, and Dementia  RNCM Clinical Goal(s):  Patient will verbalize understanding of plan for management of HTN, HLD, and Dementia work with community resource care guide to address needs related to  [employer] knowledge of community resource: for patient's with dementia and personal care services/day programs    through collaboration with Consulting civil engineer, provider, and care team.   Interventions: 1:1 collaboration with primary care provider regarding development and update of comprehensive plan of care as evidenced by provider attestation and co-signature Inter-disciplinary care team collaboration (see longitudinal plan of care) Evaluation of current treatment plan related to  self management and patient's adherence to plan as established by provider Assessed for caregiver stress  Dementia:  (Status:  Goal on track:  Yes.)  Long Term Goal Advised to contact provider for new or worsening symptoms  Encouraged to continue to take medications as prescribed Encouraged to continue patient's daily routine Upcoming appointments reviewed: Nutritionist(01/2022); Oncologist(02/2022); PCP(03/2022); Neurologist (04/2022) Provided positive feedback to Mr. Maddison regarding management of patient's health   Hypertension Interventions: (Status: Goal on track:  Yes.) Long Term Goal 02/12/22 Home BP 121/70 BP Readings from Last 3 Encounters:  09/19/21 105/70  09/12/21 110/60  07/12/21 118/60  Most recent eGFR/CrCl:  Lab Results  Component Value Date   EGFR >60 08/13/2017    No components found for: CRCL  Reviewed medications with patient and discussed importance of compliance Discussed plans with patient for ongoing care management follow up and provided patient with direct contact information for care management team Encouraged to continue to attend provider visits as scheduled  Hyperlipidemia Interventions: (Status: Goal on track: Yes.) Long Term Goal Lipid Panel     Component Value Date/Time   CHOL 147 01/14/2022 0852   TRIG 160.0 (H) 01/14/2022 0852   HDL 31.30 (L) 01/14/2022 0852   CHOLHDL 5 01/14/2022 0852   VLDL 32.0 01/14/2022 0852   LDLCALC 83 01/14/2022 0852   LDLCALC 115 (H) 07/03/2020 0843   LDLDIRECT 127.0 10/11/2020 0738    Medications reviewed and encouraged to continue to take as prescribed Reviewed Lipid Panel Reiterated Healthy eating. Avoid foods high in saturated fats and transfats Encouraged to continue to attend provider appointments as scheduled: Nutritionist(01/2022); Oncologist(02/2022); PCP(03/2022); Neurologist (04/2022) Encouraged to continue to take medications as recommended.  Patient Goals/Self-Care Activities: Continue to take medications as prescribed. Continue Safe environment strategies: personal care assistance and supervision. Continue to Monitor for safety hazards: kitchen potential safety hazards- use appliances that have an automatic shut-off feature, apply stove knob covers, remove  knobs or turn off gas when stove is not in use, disconnect the garbage disposal. Monitor your home for hazardous equipment/chemicals-Keep out of reach for example medications, alcohol, matches sharp objects, cleaning products, laundry pacs, bleach and other harmful chemicals. Contact Alzheimer's Association for more  information [phone].   Continue to keep usual daily routine Contact provider for healthcare questions or concerns Contact your nurse Care Manager as needed for ongoing care coordination or care management needs   Plan:Telephone follow up appointment with care management team member scheduled for:  05/16/22 The patient has been provided with contact information for the care management team and has been advised to call with any health related questions or concerns.   Thea Silversmith, RN, MSN, BSN, CCM Care Management Coordinator [facility] [phone]

## 2022-02-18 ENCOUNTER — Ambulatory Visit: Payer: BC Managed Care – PPO | Admitting: Endocrinology

## 2022-02-27 ENCOUNTER — Ambulatory Visit: Payer: BC Managed Care – PPO | Admitting: Registered"

## 2022-02-27 DIAGNOSIS — I10 Essential (primary) hypertension: Secondary | ICD-10-CM

## 2022-02-27 DIAGNOSIS — E785 Hyperlipidemia, unspecified: Secondary | ICD-10-CM

## 2022-02-27 DIAGNOSIS — F0390 Unspecified dementia without behavioral disturbance: Secondary | ICD-10-CM

## 2022-03-15 ENCOUNTER — Other Ambulatory Visit: Payer: Self-pay

## 2022-03-15 ENCOUNTER — Inpatient Hospital Stay: Payer: BC Managed Care – PPO | Attending: Internal Medicine

## 2022-03-15 DIAGNOSIS — C9000 Multiple myeloma not having achieved remission: Secondary | ICD-10-CM | POA: Insufficient documentation

## 2022-03-15 DIAGNOSIS — Z9484 Stem cells transplant status: Secondary | ICD-10-CM | POA: Insufficient documentation

## 2022-03-15 DIAGNOSIS — Z79899 Other long term (current) drug therapy: Secondary | ICD-10-CM | POA: Insufficient documentation

## 2022-03-15 DIAGNOSIS — C9001 Multiple myeloma in remission: Secondary | ICD-10-CM

## 2022-03-15 LAB — CMP (CANCER CENTER ONLY)
ALT: 23 U/L (ref 0–44)
AST: 27 U/L (ref 15–41)
Albumin: 4 g/dL (ref 3.5–5.0)
Alkaline Phosphatase: 58 U/L (ref 38–126)
Anion gap: 7 (ref 5–15)
BUN: 19 mg/dL (ref 8–23)
CO2: 29 mmol/L (ref 22–32)
Calcium: 9.7 mg/dL (ref 8.9–10.3)
Chloride: 104 mmol/L (ref 98–111)
Creatinine: 0.89 mg/dL (ref 0.44–1.00)
GFR, Estimated: >60 mL/min (ref >60)
Glucose, Bld: 106 mg/dL — ABNORMAL HIGH (ref 70–99)
Potassium: 3.4 mmol/L — ABNORMAL LOW (ref 3.5–5.1)
Sodium: 140 mmol/L (ref 135–145)
Total Bilirubin: 0.5 mg/dL (ref 0.3–1.2)
Total Protein: 7.8 g/dL (ref 6.5–8.1)

## 2022-03-15 LAB — CBC WITH DIFFERENTIAL (CANCER CENTER ONLY)
Abs Immature Granulocytes: 0.01 10*3/uL (ref 0.00–0.07)
Basophils Absolute: 0.1 10*3/uL (ref 0.0–0.1)
Basophils Relative: 1 %
Eosinophils Absolute: 0.2 10*3/uL (ref 0.0–0.5)
Eosinophils Relative: 3 %
HCT: 38 % (ref 36.0–46.0)
Hemoglobin: 12.7 g/dL (ref 12.0–15.0)
Immature Granulocytes: 0 %
Lymphocytes Relative: 53 %
Lymphs Abs: 3.3 10*3/uL (ref 0.7–4.0)
MCH: 28 pg (ref 26.0–34.0)
MCHC: 33.4 g/dL (ref 30.0–36.0)
MCV: 83.7 fL (ref 80.0–100.0)
Monocytes Absolute: 0.6 10*3/uL (ref 0.1–1.0)
Monocytes Relative: 10 %
Neutro Abs: 2.1 10*3/uL (ref 1.7–7.7)
Neutrophils Relative %: 33 %
Platelet Count: 175 10*3/uL (ref 150–400)
RBC: 4.54 MIL/uL (ref 3.87–5.11)
RDW: 13.9 % (ref 11.5–15.5)
WBC Count: 6.3 10*3/uL (ref 4.0–10.5)
nRBC: 0 % (ref 0.0–0.2)

## 2022-03-15 LAB — LACTATE DEHYDROGENASE: LDH: 114 U/L (ref 98–192)

## 2022-03-16 LAB — BETA 2 MICROGLOBULIN, SERUM: Beta-2 Microglobulin: 1.7 mg/L (ref 0.6–2.4)

## 2022-03-17 LAB — IGG, IGA, IGM
IgA: 568 mg/dL — ABNORMAL HIGH (ref 64–422)
IgG (Immunoglobin G), Serum: 1643 mg/dL — ABNORMAL HIGH (ref 586–1602)
IgM (Immunoglobulin M), Srm: 31 mg/dL (ref 26–217)

## 2022-03-18 LAB — KAPPA/LAMBDA LIGHT CHAINS
Kappa free light chain: 33.5 mg/L — ABNORMAL HIGH (ref 3.3–19.4)
Kappa, lambda light chain ratio: 1.55 (ref 0.26–1.65)
Lambda free light chains: 21.6 mg/L (ref 5.7–26.3)

## 2022-03-20 ENCOUNTER — Inpatient Hospital Stay (HOSPITAL_BASED_OUTPATIENT_CLINIC_OR_DEPARTMENT_OTHER): Payer: BC Managed Care – PPO | Admitting: Internal Medicine

## 2022-03-20 ENCOUNTER — Other Ambulatory Visit: Payer: Self-pay

## 2022-03-20 VITALS — BP 161/134 | HR 114 | Temp 97.0°F | Resp 19 | Wt 169.4 lb

## 2022-03-20 DIAGNOSIS — C9001 Multiple myeloma in remission: Secondary | ICD-10-CM | POA: Diagnosis not present

## 2022-03-20 DIAGNOSIS — Z79899 Other long term (current) drug therapy: Secondary | ICD-10-CM | POA: Diagnosis not present

## 2022-03-20 DIAGNOSIS — Z9484 Stem cells transplant status: Secondary | ICD-10-CM | POA: Diagnosis not present

## 2022-03-20 DIAGNOSIS — C9000 Multiple myeloma not having achieved remission: Secondary | ICD-10-CM | POA: Diagnosis not present

## 2022-03-20 NOTE — Progress Notes (Signed)
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  PRIOR THERAPY: 1) Systemic chemotherapy with Carfilzomib, Cytoxan and dexamethasone. First dose on 11/15/2013. Status post 5 cycles. 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 3) Maintenance Revlimid 10 mg by mouth daily. Status post 2 months of treatment. 4) Maintenance Revlimid 15 mg by mouth daily. Status post 4 months of treatment. 5) Maintenance treatment with Revlimid 10 mg by mouth daily status post 17 months. She completed 2 years of treatment in February 2018.  CURRENT THERAPY: Observation.  INTERVAL HISTORY: Victoria Gates 71 y.o. female returns to the clinic today for 6 months follow-up visit accompanied by her husband.  The patient is feeling fine today with no concerning complaints.  She denied having any current chest pain, shortness of breath, cough or hemoptysis.  She has no nausea, vomiting, diarrhea or constipation.  She denied having any weight loss or night sweats.  She has no fever or chills.  She had repeat myeloma panel performed recently and she is here for evaluation and discussion of her lab results.  MEDICAL HISTORY: Past Medical History:  Diagnosis Date   Allergic rhinitis 01/02/2009   Bradycardia 04/29/2016   Cervical Radiculopathy 02/01/2010   Left side   Chest pain 04/15/2012   Ex MV: Ex time 10 mins, EF 63%, no ischemia/infarct. Occas PAC's/PVC's.   Diabetes mellitus, type II 03/18/2008   Encounter for antineoplastic chemotherapy 04/29/2016   Esophageal Stricture 04/15/2007   s/p dilitation   GERD (gastroesophageal reflux disease) 09/21/2010   Herpes zoster 11/05/2010   Hiatal Hernia 04/15/2007   History of colonic polyps 04/24/2007   Hyperplastic only    Hypercholesterolemia 07/03/2009   Hypokalemia 11/30/2015   Iron deficiency anemia 04/24/2007   Multiple myeloma ([place]) 02/2014   Osteoporosis 04/24/2007    ALLERGIES:  is allergic to atorvastatin, rosuvastatin, and sulfonamide derivatives.  MEDICATIONS:  Current Outpatient Medications  Medication Sig Dispense Refill   Ascorbic Acid (VITAMIN C) 1000 MG tablet Take 1,000 mg by mouth daily.      Calcium 500-125 MG-UNIT TABS Take 1 tablet by mouth daily.     cholecalciferol (VITAMIN D) 1000 UNITS tablet Take 1,000 Units by mouth daily.     feeding supplement, ENSURE COMPLETE, (ENSURE COMPLETE) LIQD Take 237 mLs by mouth 3 (three) times daily between meals.     ibuprofen (ADVIL) 600 MG tablet Take 600 mg by mouth every 6 (six) hours as needed.     lansoprazole (PREVACID) 30 MG capsule Take 30 mg by mouth daily with breakfast.      LIVALO 2 MG TABS TAKE 1 TABLET BY MOUTH EVERY DAY 90 tablet 1   mirtazapine (REMERON) 15 MG tablet TAKE 1 TABLET BY MOUTH EVERYDAY AT BEDTIME 90 tablet 0   Potassium Chloride ER 20 MEQ TBCR Take 1 tablet by mouth daily. 90 tablet 1   rivastigmine (EXELON) 1.5 MG capsule Take 1 capsule (1.5 mg total) by mouth 2 (two) times daily. 180 capsule 3   No current facility-administered medications for this visit.    SURGICAL HISTORY:  Past Surgical History:  Procedure Laterality Date   ESOPHAGOGASTRODUODENOSCOPY  04/15/2007   TUBAL LIGATION      REVIEW OF SYSTEMS:  A comprehensive review of systems was negative.  PHYSICAL EXAMINATION: General appearance: alert, cooperative, and no distress Head: Normocephalic, without obvious abnormality, atraumatic Neck: no adenopathy, no JVD, supple, symmetrical, trachea midline, and thyroid not enlarged, symmetric, no tenderness/mass/nodules Lymph nodes: Cervical, supraclavicular, and axillary nodes normal. Resp: clear to auscultation bilaterally Back: symmetric, no curvature. ROM normal. No CVA tenderness. Cardio: regular rate  and rhythm, S1, S2 normal, no murmur, click, rub or gallop GI: soft, non-tender; bowel sounds normal; no masses,  no organomegaly Extremities: extremities normal, atraumatic, no cyanosis or edema  ECOG PERFORMANCE STATUS: 1 - Symptomatic but completely ambulatory  Blood pressure (!) 161/134, pulse (!) 114, temperature (!) 97 F (36.1 C), temperature source Tympanic, resp. rate 19, weight 169 lb 7 oz (76.9 kg), SpO2 100 %.  LABORATORY DATA: Lab Results  Component Value Date   WBC 6.3 03/15/2022   HGB 12.7 03/15/2022   HCT 38.0 03/15/2022   MCV 83.7 03/15/2022   PLT 175 03/15/2022      Chemistry      Component Value Date/Time   NA 140 03/15/2022 0930   NA 139 08/13/2017 0816   K 3.4 (L) 03/15/2022 0930   K 3.2 (L) 08/13/2017 0816   CL 104 03/15/2022 0930   CO2 29 03/15/2022 0930   CO2 26 08/13/2017 0816   BUN 19 03/15/2022 0930   BUN 9.0 08/13/2017 0816   CREATININE 0.89 03/15/2022 0930   CREATININE 1.04 (H) 07/03/2020 0843   CREATININE 1.0 08/13/2017 0816      Component Value Date/Time   CALCIUM 9.7 03/15/2022 0930   CALCIUM 9.2 08/13/2017 0816   ALKPHOS 58 03/15/2022 0930   ALKPHOS 59 08/13/2017 0816   AST 27 03/15/2022 0930   AST 22 08/13/2017 0816   ALT 23 03/15/2022 0930   ALT 17 08/13/2017 0816   BILITOT 0.5 03/15/2022 0930   BILITOT 0.80 08/13/2017 0816       RADIOGRAPHIC STUDIES: No results found.  ASSESSMENT AND PLAN:  This is a very pleasant 71 years old African-American female with multiple myeloma, IgA subtype status post induction systemic chemotherapy with Carfilzomib, Cytoxan and dexamethasone followed by peripheral blood autologous stem cell transplant followed by 2 years maintenance of treatment with Revlimid. The patient has been in observation for few years now with no concerning complaints. Repeat myeloma panel showed no concerning findings for disease progression. I recommended for her to continue on observation with repeat myeloma panel in  6 months. The patient was advised to call immediately if she has any other concerning symptoms in the interval. The patient voices understanding of current disease status and treatment options and is in agreement with the current care plan. All questions were answered. The patient knows to call the clinic with any problems, questions or concerns. We can certainly see the patient much sooner if necessary.  Disclaimer: This note was dictated with voice recognition software. Similar sounding words can inadvertently be transcribed and may not be corrected upon review.

## 2022-04-01 ENCOUNTER — Ambulatory Visit (INDEPENDENT_AMBULATORY_CARE_PROVIDER_SITE_OTHER): Payer: BC Managed Care – PPO

## 2022-04-01 DIAGNOSIS — I10 Essential (primary) hypertension: Secondary | ICD-10-CM

## 2022-04-01 DIAGNOSIS — F0280 Dementia in other diseases classified elsewhere without behavioral disturbance: Secondary | ICD-10-CM

## 2022-04-01 DIAGNOSIS — E785 Hyperlipidemia, unspecified: Secondary | ICD-10-CM

## 2022-04-01 NOTE — Chronic Care Management (AMB) (Signed)
Chronic Care Management   CCM RN Visit Note  04/01/2022 Name: FERN CANOVA MRN: [id number] DOB: 1951-02-22  Subjective: SINAYA MINOGUE is a 71 y.o. year old female who is a primary care patient of Ann Held, DO. The care management team was consulted for assistance with disease management and care coordination needs.    Engaged with patient by telephone for follow up visit in response to provider referral for case management and/or care coordination services.   Consent to Services:  The patient was given information about Chronic Care Management services, agreed to services, and gave verbal consent prior to initiation of services.  Please see initial visit note for detailed documentation.   Patient agreed to services and verbal consent obtained.   Assessment: Review of patient past medical history, allergies, medications, health status, including review of consultants reports, laboratory and other test data, was performed as part of comprehensive evaluation and provision of chronic care management services.   SDOH (Social Determinants of Health) assessments and interventions performed:    CCM Care Plan  Allergies  Allergen Reactions   Atorvastatin     REACTION: myalgias   Rosuvastatin     REACTION: myalgias   Sulfonamide Derivatives     Unknown reaction     Outpatient Encounter Medications as of 04/01/2022  Medication Sig   Ascorbic Acid (VITAMIN C) 1000 MG tablet Take 1,000 mg by mouth daily.    Calcium 500-125 MG-UNIT TABS Take 1 tablet by mouth daily.   cholecalciferol (VITAMIN D) 1000 UNITS tablet Take 1,000 Units by mouth daily.   feeding supplement, ENSURE COMPLETE, (ENSURE COMPLETE) LIQD Take 237 mLs by mouth 3 (three) times daily between meals.   ibuprofen (ADVIL) 600 MG tablet Take 600 mg by mouth every 6 (six) hours as needed.   lansoprazole (PREVACID) 30 MG capsule Take 30 mg by mouth daily with breakfast.    LIVALO 2 MG TABS TAKE 1 TABLET BY MOUTH EVERY  DAY   mirtazapine (REMERON) 15 MG tablet TAKE 1 TABLET BY MOUTH EVERYDAY AT BEDTIME   Potassium Chloride ER 20 MEQ TBCR Take 1 tablet by mouth daily.   rivastigmine (EXELON) 1.5 MG capsule Take 1 capsule (1.5 mg total) by mouth 2 (two) times daily.   No facility-administered encounter medications on file as of 04/01/2022.    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[place]) 04/03/2021   Pre-op examination 04/03/2021   Hyperlipidemia 07/03/2020   Mild neurocognitive disorder, 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   Bradycardia 04/29/2016   Hypokalemia 11/30/2015   Neutropenia ([place]) 05/03/2015   Syncope 09/11/2014   Bronchitis 09/08/2014   Multiple myeloma ([place]) 10/22/2013   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   Hyperproteinemia 04/14/2013   Midsternal chest pain 04/20/2012   Lipoma of shoulder 11/02/2011   Herpes zoster 11/05/2010   GERD (gastroesophageal reflux disease) 09/21/2010   Cervical Radiculopathy, Left 02/01/2010   Hypercholesterolemia 07/03/2009   Otitis Externa 07/03/2009   Cerumen impaction 07/03/2009   Myalgia 07/03/2009   Allergic rhinitis 01/02/2009   Diet-controlled diabetes mellitus ([place]) 03/18/2008   Chest pain 12/24/2007   URI 12/12/2007   Anemia-Iron Deficiency 04/24/2007   Essential hypertension 04/24/2007   Osteoporosis 04/24/2007   Esophageal Stricture 04/15/2007   Hiatal Hernia 04/15/2007    Conditions to be addressed/monitored:HTN,  HLD, and Dementia  Care Plan : RN Care Manager Plan of Care  Updates made by Luretha Rued, RN since 04/01/2022 12:00 AM     Problem: No plan of care for Managment of Chronic Condition Dementia in a patient with HTN, HLD   Priority: High     Long-Range Goal: Developement of plan of  care for Management of Dementia Completed 04/01/2022  Start Date: 07/27/2021  Expected End Date: 05/16/2022  Priority: High  Note:   Current Barriers: RNCM spoke with Mr. Dazey, husband). Per Mr. Colunga, everything is going well. He denies any questions or concerns. He continues to check BP at home and reports blood pressure last checked a couple days ago 125/85. He also reports patient has increased in weight: 154 lb on 01/14/22 to 170 lbs today. Mrs. Winegar continues to have 24/7 supervision. Mr. Tesoro is without questions or concerns at this time. Care Coordination needs related to Lacks knowledge of community resource: for patient's with dementia and personal care services/day programs Chronic Disease Management support and education needs related to HTN, HLD, and Dementia  RNCM Clinical Goal(s):  Patient will verbalize understanding of plan for management of HTN, HLD, and Dementia work with community resource care guide to address needs related to  [employer] knowledge of community resource: for patient's with dementia and personal care services/day programs    through collaboration with Consulting civil engineer, provider, and care team.   Interventions: 1:1 collaboration with primary care provider regarding development and update of comprehensive plan of care as evidenced by provider attestation and co-signature Inter-disciplinary care team collaboration (see longitudinal plan of care) Evaluation of current treatment plan related to  self management and patient's adherence to plan as established by provider Assessed for caregiver stress  Dementia:  (Status:  Goal Met.)  Long Term Goal Advised to contact provider for new or worsening symptoms  Encouraged to continue to take medications as prescribed Encouraged to continue patient's daily routine Upcoming appointments reviewed. Provided positive feedback to Mr. Dufrane regarding management of patient's health   Hypertension Interventions: (Status:  Goal Met.) Long Term Goal 04/01/22 125/85 about 2 days ago Per chart BP taken 03/20/22-patient was moving and would not sit still during BP reading BP Readings from Last 3 Encounters:  03/20/22 (!) 161/134  01/14/22 124/82  09/19/21 105/70  Most recent eGFR/CrCl:  Lab Results  Component Value Date   EGFR >60 08/13/2017    No components found for: CRCL  Reviewed medications with patient and discussed importance of compliance Discussed plans with patient for ongoing care management follow up and provided patient with direct contact information for care management team Encouraged to continue to attend provider visits as scheduled  Hyperlipidemia Interventions: (Status: Goal Met) Long Term Goal Verbalized health eating avoiding saturated fats, transfats, processed foods and staying active. Continues to take medications as prescribed. Attends provider follow ups as scheduled. Lipid Panel     Component Value Date/Time   CHOL 147 01/14/2022 0852   TRIG 160.0 (H) 01/14/2022 0852   HDL 31.30 (L) 01/14/2022 0852   CHOLHDL 5 01/14/2022 0852   VLDL 32.0 01/14/2022 0852   LDLCALC 83 01/14/2022 0852   LDLCALC 115 (H) 07/03/2020 0843   LDLDIRECT 127.0 10/11/2020 0738    Medications reviewed and encouraged to continue to take as prescribed Reiterated Healthy eating. Avoid foods high in saturated fats and transfats Encouraged to continue to attend provider appointments as scheduled Encouraged to continue to take medications as recommended.  Patient Goals/Self-Care  Activities: Continue to take medications as prescribed. Continue Safe environment strategies: personal care assistance and supervision. Continue to Monitor for safety hazards: kitchen potential safety hazards- use appliances that have an automatic shut-off feature, apply stove knob covers, remove knobs or turn off gas when stove is not in use, disconnect the garbage disposal. Monitor your home for hazardous equipment/chemicals-Keep out of  reach for example medications, alcohol, matches sharp objects, cleaning products, laundry pacs, bleach and other harmful chemicals. Contact [employer] for more information [phone].   Continue to keep usual daily routine Contact provider for healthcare questions or concerns   Plan:No further follow up required: RNCM instructed Mr. Spofford to contact primary care provider if care management needs in the future.  Thea Silversmith, RN, MSN, BSN, CCM Care Management Coordinator [facility] [phone]

## 2022-04-01 NOTE — Patient Instructions (Signed)
Visit Information  Thank you for allowing me to share the care management and care coordination services that are available to you as part of your health plan and services through your primary care provider and medical home. Please reach out to your primary care provider or me at [phone] if the care management/care coordination team may be of assistance to you in the future.   Shamel Germond, RN, MSN, BSN, CCM Care Management Coordinator [facility] [phone]  

## 2022-04-16 ENCOUNTER — Ambulatory Visit (INDEPENDENT_AMBULATORY_CARE_PROVIDER_SITE_OTHER): Payer: BC Managed Care – PPO | Admitting: Family Medicine

## 2022-04-16 ENCOUNTER — Encounter: Payer: Self-pay | Admitting: Family Medicine

## 2022-04-16 VITALS — BP 129/86 | HR 98 | Resp 18 | Ht 68.0 in | Wt 164.2 lb

## 2022-04-16 DIAGNOSIS — E119 Type 2 diabetes mellitus without complications: Secondary | ICD-10-CM | POA: Diagnosis not present

## 2022-04-16 DIAGNOSIS — F0280 Dementia in other diseases classified elsewhere without behavioral disturbance: Secondary | ICD-10-CM

## 2022-04-16 DIAGNOSIS — I10 Essential (primary) hypertension: Secondary | ICD-10-CM | POA: Diagnosis not present

## 2022-04-16 DIAGNOSIS — E785 Hyperlipidemia, unspecified: Secondary | ICD-10-CM

## 2022-04-16 DIAGNOSIS — F419 Anxiety disorder, unspecified: Secondary | ICD-10-CM

## 2022-04-16 DIAGNOSIS — G3109 Other frontotemporal dementia: Secondary | ICD-10-CM

## 2022-04-16 DIAGNOSIS — E1169 Type 2 diabetes mellitus with other specified complication: Secondary | ICD-10-CM

## 2022-04-16 DIAGNOSIS — C9001 Multiple myeloma in remission: Secondary | ICD-10-CM

## 2022-04-16 DIAGNOSIS — E441 Mild protein-calorie malnutrition: Secondary | ICD-10-CM

## 2022-04-16 LAB — COMPREHENSIVE METABOLIC PANEL
ALT: 23 U/L (ref 0–35)
AST: 27 U/L (ref 0–37)
Albumin: 4 g/dL (ref 3.5–5.2)
Alkaline Phosphatase: 70 U/L (ref 39–117)
BUN: 19 mg/dL (ref 6–23)
CO2: 28 mEq/L (ref 19–32)
Calcium: 9.7 mg/dL (ref 8.4–10.5)
Chloride: 102 mEq/L (ref 96–112)
Creatinine, Ser: 0.92 mg/dL (ref 0.40–1.20)
GFR: 62.66 mL/min (ref >60.00)
Glucose, Bld: 160 mg/dL — ABNORMAL HIGH (ref 70–99)
Potassium: 3.5 mEq/L (ref 3.5–5.1)
Sodium: 139 mEq/L (ref 135–145)
Total Bilirubin: 0.6 mg/dL (ref 0.2–1.2)
Total Protein: 7.8 g/dL (ref 6.0–8.3)

## 2022-04-16 LAB — LIPID PANEL
Cholesterol: 187 mg/dL (ref 0–200)
HDL: 34.1 mg/dL — ABNORMAL LOW (ref >39.00)
NonHDL: 152.83
Total CHOL/HDL Ratio: 5
Triglycerides: 307 mg/dL — ABNORMAL HIGH (ref 0.0–149.0)
VLDL: 61.4 mg/dL — ABNORMAL HIGH (ref 0.0–40.0)

## 2022-04-16 LAB — LDL CHOLESTEROL, DIRECT: Direct LDL: 113 mg/dL

## 2022-04-16 MED ORDER — MIRTAZAPINE 30 MG PO TABS: 30.0000 mg | ORAL_TABLET | Freq: Every day | ORAL | 3 refills | Status: DC

## 2022-04-16 NOTE — Progress Notes (Signed)
Established Patient Office Visit  Subjective   Patient ID: Victoria Gates, female    DOB: Feb 12, 1951  Age: 71 y.o. MRN: [id number]  Chief Complaint  Patient presents with   Hyperlipidemia   Follow-up    HPI Pt is here with her husband to f/u cholesterol.   Thee remeron is helping a lot but he really wants to inc the dose on the remeron. 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[place]) 04/03/2021   Pre-op examination 04/03/2021   Hyperlipidemia 07/03/2020   Mild neurocognitive disorder, 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   Bradycardia 04/29/2016   Hypokalemia 11/30/2015   Neutropenia ([place]) 05/03/2015   Syncope 09/11/2014   Bronchitis 09/08/2014   Multiple myeloma ([place]) 10/22/2013   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   Hyperproteinemia 04/14/2013   Midsternal chest pain 04/20/2012   Lipoma of shoulder 11/02/2011   Herpes zoster 11/05/2010   GERD (gastroesophageal reflux disease) 09/21/2010   Cervical Radiculopathy, Left 02/01/2010   Hypercholesterolemia 07/03/2009   Otitis Externa 07/03/2009   Cerumen impaction 07/03/2009   Myalgia 07/03/2009   Allergic rhinitis 01/02/2009   Diet-controlled diabetes mellitus ([place]) 03/18/2008   Chest pain 12/24/2007   URI 12/12/2007   Anemia-Iron Deficiency 04/24/2007   Essential hypertension 04/24/2007   Osteoporosis 04/24/2007   Esophageal Stricture 04/15/2007   Hiatal Hernia 04/15/2007   Past Medical History:  Diagnosis Date   Allergic rhinitis 01/02/2009   Bradycardia 04/29/2016   Cervical Radiculopathy 02/01/2010   Left side   Chest pain 04/15/2012   Ex MV: Ex time 10 mins, EF 63%, no ischemia/infarct. Occas PAC's/PVC's.   Diabetes mellitus, type II 03/18/2008    Encounter for antineoplastic chemotherapy 04/29/2016   Esophageal Stricture 04/15/2007   s/p dilitation   GERD (gastroesophageal reflux disease) 09/21/2010   Herpes zoster 11/05/2010   Hiatal Hernia 04/15/2007   History of colonic polyps 04/24/2007   Hyperplastic only   Hypercholesterolemia 07/03/2009   Hypokalemia 11/30/2015   Iron deficiency anemia 04/24/2007   Multiple myeloma ([place]) 02/2014   Osteoporosis 04/24/2007   Past Surgical History:  Procedure Laterality Date   ESOPHAGOGASTRODUODENOSCOPY  04/15/2007   TUBAL LIGATION     Social History   Tobacco Use   Smoking status: Never   Smokeless tobacco: Never  Vaping Use   Vaping Use: Never used  Substance Use Topics   Alcohol use: No   Drug use: No   Social History   Socioeconomic History   Marital status: Married    Spouse name: Not on file   Number of children: Not on file   Years of education: Not on file   Highest education level: Not on file  Occupational History   Occupation: MAIL Armed forces operational officer: [address]  Tobacco Use   Smoking status: Never   Smokeless tobacco: Never  Vaping Use   Vaping Use: Never used  Substance and Sexual Activity   Alcohol use: No   Drug use: No   Sexual activity: Yes  Other Topics Concern   Not on file  Social History Narrative   Lives in [place] with spouse.   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   Right Handed   Social Determinants of Health   Financial Resource Strain: Not on file  Food Insecurity: No Food Insecurity (07/27/2021)   Hunger Vital Sign    Worried About Running Out of Food in the Last Year: Never true    Ran Out of Food in the Last Year: Never true  Transportation Needs: No Transportation Needs (07/27/2021)   PRAPARE - Hydrologist (Medical): No    Lack of Transportation (Non-Medical): No  Physical Activity: Not on file  Stress: Not on file  Social Connections: Not on file  Intimate Partner Violence:  Not on file   Family Status  Relation Name Status   Mother  Deceased       mother had "brain tumor"   Father  Deceased   Sister  Alive       major depression   Sister  Alive   Sister  Deceased at age 51       als   Sister  Deceased at age 80       depression protein cal malnutrition heart attack   Brother  (Not Specified)   Neg Hx  (Not Specified)   Family History  Problem Relation Age of Onset   Brain cancer Mother    Heart disease Mother    Lung cancer Father    Heart disease Father    Diabetes Sister    Heart disease Sister    Heart attack Sister    ALS Sister    Diabetes Brother    Heart disease Brother    Cancer Neg Hx        No FH of Colon Cancer   Colon cancer Neg Hx    Esophageal cancer Neg Hx    Rectal cancer Neg Hx    Stomach cancer Neg Hx    Allergies  Allergen Reactions   Atorvastatin     REACTION: myalgias   Rosuvastatin     REACTION: myalgias   Sulfonamide Derivatives     Unknown reaction       Review of Systems  Constitutional:  Negative for fever and malaise/fatigue.  HENT:  Negative for congestion.   Eyes:  Negative for blurred vision.  Respiratory:  Negative for shortness of breath.   Cardiovascular:  Negative for chest pain, palpitations and leg swelling.  Gastrointestinal:  Negative for abdominal pain, blood in stool and nausea.  Genitourinary:  Negative for dysuria and frequency.  Musculoskeletal:  Negative for falls.  Skin:  Negative for rash.  Neurological:  Negative for dizziness, loss of consciousness and headaches.  Endo/Heme/Allergies:  Negative for environmental allergies.  Psychiatric/Behavioral:  Negative for depression. The patient is not nervous/anxious.       Objective:     BP 129/86   Pulse 98   Resp 18   Ht _0  (1.727 m)   Wt 164 lb 3.2 oz (74.5 kg)   SpO2 97%   BMI 24.97 kg/m  BP Readings from Last 3 Encounters:  04/16/22 129/86  03/20/22 (!) 161/134  01/14/22 124/82   Wt Readings from Last 3  Encounters:  04/16/22 164 lb 3.2 oz (74.5 kg)  03/20/22 169 lb 7 oz (76.9 kg)  01/14/22 154 lb 12.8 oz (70.2 kg)   SpO2 Readings from Last 3 Encounters:  04/16/22 97%  03/20/22 100%  01/14/22 98%      Physical Exam Vitals and nursing note reviewed.  Constitutional:      Appearance: She is well-developed.  HENT:     Head: Normocephalic and atraumatic.  Eyes:     Conjunctiva/sclera: Conjunctivae normal.  Neck:     Thyroid: No thyromegaly.     Vascular: No carotid bruit or JVD.  Cardiovascular:     Rate and Rhythm: Normal rate and regular rhythm.     Heart sounds: Normal heart sounds. No murmur heard. Pulmonary:     Effort: Pulmonary effort is normal. No respiratory distress.     Breath sounds: Normal breath sounds. No wheezing or rales.  Chest:     Chest wall: No tenderness.  Musculoskeletal:     Cervical back: Normal range of motion and neck supple.  Neurological:     Mental Status: She is alert. Mental status is at baseline.  Psychiatric:        Attention and Perception: She is inattentive.        Mood and Affect: Mood is anxious. Affect is inappropriate.        Behavior: Behavior is uncooperative.        Thought Content: Thought content does not include homicidal or suicidal ideation. Thought content does not include homicidal or suicidal plan.        Cognition and Memory: Cognition is impaired. Memory is impaired.      Results for orders placed or performed in visit on 04/16/22  Comprehensive metabolic panel  Result Value Ref Range   Sodium 139 135 - 145 mEq/L   Potassium 3.5 3.5 - 5.1 mEq/L   Chloride 102 96 - 112 mEq/L   CO2 28 19 - 32 mEq/L   Glucose, Bld 160 (H) 70 - 99 mg/dL   BUN 19 6 - 23 mg/dL   Creatinine, Ser 0.92 0.40 - 1.20 mg/dL   Total Bilirubin 0.6 0.2 - 1.2 mg/dL   Alkaline Phosphatase 70 39 - 117 U/L   AST 27 0 - 37 U/L   ALT 23 0 - 35 U/L   Total Protein 7.8 6.0 - 8.3 g/dL   Albumin 4.0 3.5 - 5.2 g/dL   GFR 62.66 >60.00 mL/min    Calcium 9.7 8.4 - 10.5 mg/dL  Lipid panel  Result Value Ref Range   Cholesterol 187 0 - 200 mg/dL   Triglycerides 307.0 (H) 0.0 - 149.0 mg/dL   HDL 34.10 (L) >39.00 mg/dL   VLDL 61.4 (H) 0.0 - 40.0 mg/dL   Total CHOL/HDL Ratio 5    NonHDL 152.83   LDL cholesterol, direct  Result Value Ref Range   Direct LDL 113.0 mg/dL    Last CBC Lab Results  Component Value Date   WBC 6.3 03/15/2022   HGB 12.7 03/15/2022   HCT 38.0 03/15/2022   MCV 83.7 03/15/2022   MCH 28.0 03/15/2022   RDW 13.9 03/15/2022   PLT 175 24/40/1027   Last metabolic panel Lab Results  Component Value Date   GLUCOSE 160 (H) 04/16/2022   NA 139 04/16/2022   K 3.5 04/16/2022   CL 102 04/16/2022   CO2 28 04/16/2022   BUN 19 04/16/2022   CREATININE 0.92 04/16/2022   GFRNONAA >60 03/15/2022   CALCIUM 9.7 04/16/2022   PHOS 4.5 04/06/2012   PROT 7.8 04/16/2022   ALBUMIN 4.0 04/16/2022   BILITOT 0.6 04/16/2022   ALKPHOS 70 04/16/2022   AST 27 04/16/2022   ALT 23 04/16/2022   ANIONGAP 7 03/15/2022   Last lipids Lab Results  Component Value Date   CHOL 187 04/16/2022   HDL 34.10 (L) 04/16/2022   LDLCALC 83 01/14/2022   LDLDIRECT 113.0 04/16/2022   TRIG 307.0 (H) 04/16/2022   CHOLHDL 5 04/16/2022  Last hemoglobin A1c Lab Results  Component Value Date   HGBA1C 5.7 (A) 09/12/2021   Last thyroid functions Lab Results  Component Value Date   TSH 4.49 12/08/2018   Last vitamin D Lab Results  Component Value Date   VD25OH 37.30 09/01/2017   Last vitamin B12 and Folate Lab Results  Component Value Date   VITAMINB12 269 01/31/2011   FOLATE 11.8 01/31/2011      The 10-year ASCVD risk score (Arnett DK, et al., 2019) is: 24.8%    Assessment & Plan:   Problem List Items Addressed This Visit       Unprioritized   Hyperlipidemia   Relevant Orders   Comprehensive metabolic panel (Completed)   Lipid panel (Completed)   Mild protein-calorie malnutrition ([place])   Relevant Medications    mirtazapine (REMERON) 30 MG tablet   Multiple myeloma ([place])    Per onc      Relevant Medications   mirtazapine (REMERON) 30 MG tablet   Hyperlipidemia associated with type 2 diabetes mellitus ([place])    Encourage heart healthy diet such as MIND or DASH diet, increase exercise, avoid trans fats, simple carbohydrates and processed foods, consider a krill or fish or flaxseed oil cap daily.       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 Encouraged heart healthy diet such as the DASH diet and exercise as tolerated.        Diet-controlled diabetes mellitus ([place])    Per endo      Other Visit Diagnoses     Anxiety    -  Primary   Relevant Medications   mirtazapine (REMERON) 30 MG tablet       Return in about 6 months (around 10/17/2022), or if symptoms worsen or fail to improve, for annual exam, fasting.    Ann Held, DO

## 2022-04-16 NOTE — Assessment & Plan Note (Signed)
Encourage heart healthy diet such as MIND or DASH diet, increase exercise, avoid trans fats, simple carbohydrates and processed foods, consider a krill or fish or flaxseed oil cap daily.  °

## 2022-04-16 NOTE — Assessment & Plan Note (Signed)
Stable Per neuro 

## 2022-04-16 NOTE — Assessment & Plan Note (Signed)
Per onc  

## 2022-04-16 NOTE — Patient Instructions (Signed)
Cholesterol Content in Foods ?Cholesterol is a waxy, fat-like substance that helps to carry fat in the blood. The body needs cholesterol in small amounts, but too much cholesterol can cause damage to the arteries and heart. ?What foods have cholesterol? ? ?Cholesterol is found in animal-based foods, such as meat, seafood, and dairy. Generally, low-fat dairy and lean meats have less cholesterol than full-fat dairy and fatty meats. The milligrams of cholesterol per serving (mg per serving) of common cholesterol-containing foods are listed below. ?Meats and other proteins ?Egg -- one large whole egg has 186 mg. ?Veal shank -- 4 oz (113 g) has 141 mg. ?Lean ground turkey (93% lean) -- 4 oz (113 g) has 118 mg. ?Fat-trimmed lamb loin -- 4 oz (113 g) has 106 mg. ?Lean ground beef (90% lean) -- 4 oz (113 g) has 100 mg. ?Lobster -- 3.5 oz (99 g) has 90 mg. ?Pork loin chops -- 4 oz (113 g) has 86 mg. ?Canned salmon -- 3.5 oz (99 g) has 83 mg. ?Fat-trimmed beef top loin -- 4 oz (113 g) has 78 mg. ?Frankfurter -- 1 frank (3.5 oz or 99 g) has 77 mg. ?Crab -- 3.5 oz (99 g) has 71 mg. ?Roasted chicken without skin, white meat -- 4 oz (113 g) has 66 mg. ?Light bologna -- 2 oz (57 g) has 45 mg. ?Deli-cut turkey -- 2 oz (57 g) has 31 mg. ?Canned tuna -- 3.5 oz (99 g) has 31 mg. ?Bacon -- 1 oz (28 g) has 29 mg. ?Oysters and mussels (raw) -- 3.5 oz (99 g) has 25 mg. ?Mackerel -- 1 oz (28 g) has 22 mg. ?Trout -- 1 oz (28 g) has 20 mg. ?Pork sausage -- 1 link (1 oz or 28 g) has 17 mg. ?Salmon -- 1 oz (28 g) has 16 mg. ?Tilapia -- 1 oz (28 g) has 14 mg. ?Dairy ?Soft-serve ice cream -- ? cup (4 oz or 86 g) has 103 mg. ?Whole-milk yogurt -- 1 cup (8 oz or 245 g) has 29 mg. ?Cheddar cheese -- 1 oz (28 g) has 28 mg. ?American cheese -- 1 oz (28 g) has 28 mg. ?Whole milk -- 1 cup (8 oz or 250 mL) has 23 mg. ?2% milk -- 1 cup (8 oz or 250 mL) has 18 mg. ?Cream cheese -- 1 tablespoon (Tbsp) (14.5 g) has 15 mg. ?Cottage cheese -- ? cup (4 oz or  113 g) has 14 mg. ?Low-fat (1%) milk -- 1 cup (8 oz or 250 mL) has 10 mg. ?Sour cream -- 1 Tbsp (12 g) has 8.5 mg. ?Low-fat yogurt -- 1 cup (8 oz or 245 g) has 8 mg. ?Nonfat Greek yogurt -- 1 cup (8 oz or 228 g) has 7 mg. ?Half-and-half cream -- 1 Tbsp (15 mL) has 5 mg. ?Fats and oils ?Cod liver oil -- 1 tablespoon (Tbsp) (13.6 g) has 82 mg. ?Butter -- 1 Tbsp (14 g) has 15 mg. ?Lard -- 1 Tbsp (12.8 g) has 14 mg. ?Bacon grease -- 1 Tbsp (12.9 g) has 14 mg. ?Mayonnaise -- 1 Tbsp (13.8 g) has 5-10 mg. ?Margarine -- 1 Tbsp (14 g) has 3-10 mg. ?The items listed above may not be a complete list of foods with cholesterol. Exact amounts of cholesterol in these foods may vary depending on specific ingredients and brands. Contact a dietitian for more information. ?What foods do not have cholesterol? ?Most plant-based foods do not have cholesterol unless you combine them with a food that has   cholesterol. Foods without cholesterol include: ?Grains and cereals. ?Vegetables. ?Fruits. ?Vegetable oils, such as olive, canola, and sunflower oil. ?Legumes, such as peas, beans, and lentils. ?Nuts and seeds. ?Egg whites. ?The items listed above may not be a complete list of foods that do not have cholesterol. Contact a dietitian for more information. ?Summary ?The body needs cholesterol in small amounts, but too much cholesterol can cause damage to the arteries and heart. ?Cholesterol is found in animal-based foods, such as meat, seafood, and dairy. Generally, low-fat dairy and lean meats have less cholesterol than full-fat dairy and fatty meats. ?This information is not intended to replace advice given to you by your health care provider. Make sure you discuss any questions you have with your health care provider. ?Document Revised: 01/26/2021 Document Reviewed: 01/26/2021 ?Elsevier Patient Education ? 2023 Elsevier Inc. ? ?

## 2022-04-16 NOTE — Assessment & Plan Note (Signed)
Per endo °

## 2022-04-16 NOTE — Assessment & Plan Note (Signed)
Well controlled, no changes to meds. Encouraged heart healthy diet such as the DASH diet and exercise as tolerated.  °

## 2022-04-24 ENCOUNTER — Telehealth: Payer: Self-pay | Admitting: Internal Medicine

## 2022-04-24 NOTE — Telephone Encounter (Signed)
Called patient regarding upcoming appointments, left a voicemail. 

## 2022-04-27 ENCOUNTER — Other Ambulatory Visit: Payer: Self-pay | Admitting: Family Medicine

## 2022-04-27 DIAGNOSIS — I10 Essential (primary) hypertension: Secondary | ICD-10-CM

## 2022-04-27 DIAGNOSIS — E785 Hyperlipidemia, unspecified: Secondary | ICD-10-CM

## 2022-04-27 DIAGNOSIS — E119 Type 2 diabetes mellitus without complications: Secondary | ICD-10-CM

## 2022-04-29 DIAGNOSIS — E785 Hyperlipidemia, unspecified: Secondary | ICD-10-CM

## 2022-04-29 DIAGNOSIS — G3109 Other frontotemporal dementia: Secondary | ICD-10-CM

## 2022-04-29 DIAGNOSIS — I10 Essential (primary) hypertension: Secondary | ICD-10-CM | POA: Diagnosis not present

## 2022-04-29 DIAGNOSIS — F0280 Dementia in other diseases classified elsewhere without behavioral disturbance: Secondary | ICD-10-CM | POA: Diagnosis not present

## 2022-05-03 ENCOUNTER — Other Ambulatory Visit: Payer: Self-pay | Admitting: *Deleted

## 2022-05-03 MED ORDER — PITAVASTATIN CALCIUM 4 MG PO TABS: 4.0000 mg | ORAL_TABLET | Freq: Every day | ORAL | 3 refills | Status: DC

## 2022-05-04 ENCOUNTER — Other Ambulatory Visit: Payer: Self-pay | Admitting: Family Medicine

## 2022-05-04 DIAGNOSIS — F418 Other specified anxiety disorders: Secondary | ICD-10-CM

## 2022-05-14 NOTE — Progress Notes (Unsigned)
   Virtual Visit via Video Note The purpose of this virtual visit is to provide medical care while limiting exposure to the novel coronavirus.    Consent was obtained for video visit:  {yes no:314532} Answered questions that patient had about telehealth interaction:  {yes no:314532} I discussed the limitations, risks, security and privacy concerns of performing an evaluation and management service by telemedicine. I also discussed with the patient that there may be a patient responsible charge related to this service. The patient expressed understanding and agreed to proceed.  Pt location: Home Physician Location: office Name of referring provider:  Ann Held, * I connected with Victoria Gates at patients initiation/request on 05/15/2022 at  7:50 AM EDT by video enabled telemedicine application and verified that I am speaking with the correct person using two identifiers. Pt MRN:  [id number] Pt DOB:  September 27, 1951 Video Participants:  Victoria Gates;  ***   History of Present Illness: This is a 71 y.o. female returning for follow-up of ***.  She was started on Remeron in April due to reduce PO intake and zolft was stopped.  He has noticed that it helps ***, so dose was increased to '30mg'$ /d.      Observations/Objective:   There were no vitals filed for this visit. Patient is awake, alert, and appears comfortable.  Oriented x 4.   Extraocular muscles are intact. No ptosis.  Face is symmetric.  Speech is not dysarthric. Tongue is midline. Antigravity in all extremities.  No pronator drift. Gait appears normal ***.   Assessment and Plan:  Frontotemporal dementia, behavior variant.  Behavior has improved after started SSRI (previously on sertraline, no on mirtazepine).  As expected, there has been gradual decline in cognition *** Psuedobulbar palsy  - Continue rivastigmine 1.'5mg'$  BID  - Continue mirtazepine '30mg'$ /d  - ***      Follow Up Instructions:   I discussed the  assessment and treatment plan with the patient. The patient was provided an opportunity to ask questions and all were answered. The patient agreed with the plan and demonstrated an understanding of the instructions.   The patient was advised to call back or seek an in-person evaluation if the symptoms worsen or if the condition fails to improve as anticipated.  Follow-up in ***  Total time spent:  *** minutes     Alda Berthold, DO

## 2022-05-15 ENCOUNTER — Telehealth (INDEPENDENT_AMBULATORY_CARE_PROVIDER_SITE_OTHER): Payer: BC Managed Care – PPO | Admitting: Neurology

## 2022-05-15 VITALS — Wt 171.0 lb

## 2022-05-15 DIAGNOSIS — F0280 Dementia in other diseases classified elsewhere without behavioral disturbance: Secondary | ICD-10-CM | POA: Diagnosis not present

## 2022-05-15 DIAGNOSIS — G3109 Other frontotemporal dementia: Secondary | ICD-10-CM

## 2022-05-15 MED ORDER — RIVASTIGMINE TARTRATE 1.5 MG PO CAPS: 1.5000 mg | ORAL_CAPSULE | Freq: Two times a day (BID) | ORAL | 3 refills | Status: DC

## 2022-05-16 ENCOUNTER — Telehealth: Payer: BC Managed Care – PPO

## 2022-07-18 ENCOUNTER — Encounter: Payer: Self-pay | Admitting: Gastroenterology

## 2022-07-19 ENCOUNTER — Other Ambulatory Visit: Payer: Self-pay | Admitting: Family Medicine

## 2022-07-31 ENCOUNTER — Other Ambulatory Visit: Payer: Self-pay

## 2022-07-31 ENCOUNTER — Inpatient Hospital Stay (HOSPITAL_COMMUNITY)
Admission: EM | Admit: 2022-07-31 | Discharge: 2022-08-04 | DRG: 637 | Disposition: A | Discharge status: Home / Self Care | Payer: BC Managed Care – PPO | Attending: Internal Medicine | Admitting: Internal Medicine

## 2022-07-31 ENCOUNTER — Emergency Department (HOSPITAL_COMMUNITY): Payer: BC Managed Care – PPO

## 2022-07-31 ENCOUNTER — Inpatient Hospital Stay (HOSPITAL_COMMUNITY): Payer: BC Managed Care – PPO

## 2022-07-31 DIAGNOSIS — E1165 Type 2 diabetes mellitus with hyperglycemia: Secondary | ICD-10-CM | POA: Diagnosis not present

## 2022-07-31 DIAGNOSIS — R7401 Elevation of levels of liver transaminase levels: Secondary | ICD-10-CM

## 2022-07-31 DIAGNOSIS — Z833 Family history of diabetes mellitus: Secondary | ICD-10-CM

## 2022-07-31 DIAGNOSIS — Z8249 Family history of ischemic heart disease and other diseases of the circulatory system: Secondary | ICD-10-CM

## 2022-07-31 DIAGNOSIS — I499 Cardiac arrhythmia, unspecified: Secondary | ICD-10-CM | POA: Diagnosis not present

## 2022-07-31 DIAGNOSIS — C9001 Multiple myeloma in remission: Secondary | ICD-10-CM | POA: Diagnosis not present

## 2022-07-31 DIAGNOSIS — E876 Hypokalemia: Secondary | ICD-10-CM | POA: Diagnosis present

## 2022-07-31 DIAGNOSIS — Z9484 Stem cells transplant status: Secondary | ICD-10-CM | POA: Diagnosis not present

## 2022-07-31 DIAGNOSIS — K7210 Chronic hepatic failure without coma: Secondary | ICD-10-CM | POA: Diagnosis not present

## 2022-07-31 DIAGNOSIS — K219 Gastro-esophageal reflux disease without esophagitis: Secondary | ICD-10-CM | POA: Diagnosis present

## 2022-07-31 DIAGNOSIS — F03C0 Unspecified dementia, severe, without behavioral disturbance, psychotic disturbance, mood disturbance, and anxiety: Secondary | ICD-10-CM | POA: Diagnosis not present

## 2022-07-31 DIAGNOSIS — E861 Hypovolemia: Secondary | ICD-10-CM | POA: Diagnosis present

## 2022-07-31 DIAGNOSIS — R531 Weakness: Secondary | ICD-10-CM | POA: Diagnosis present

## 2022-07-31 DIAGNOSIS — R739 Hyperglycemia, unspecified: Secondary | ICD-10-CM | POA: Diagnosis not present

## 2022-07-31 DIAGNOSIS — R4182 Altered mental status, unspecified: Secondary | ICD-10-CM | POA: Diagnosis not present

## 2022-07-31 DIAGNOSIS — E7800 Pure hypercholesterolemia, unspecified: Secondary | ICD-10-CM | POA: Diagnosis present

## 2022-07-31 DIAGNOSIS — S36119A Unspecified injury of liver, initial encounter: Secondary | ICD-10-CM | POA: Diagnosis not present

## 2022-07-31 DIAGNOSIS — E872 Acidosis, unspecified: Secondary | ICD-10-CM | POA: Diagnosis not present

## 2022-07-31 DIAGNOSIS — X58XXXA Exposure to other specified factors, initial encounter: Secondary | ICD-10-CM | POA: Diagnosis present

## 2022-07-31 DIAGNOSIS — E440 Moderate protein-calorie malnutrition: Secondary | ICD-10-CM | POA: Diagnosis present

## 2022-07-31 DIAGNOSIS — Z9221 Personal history of antineoplastic chemotherapy: Secondary | ICD-10-CM

## 2022-07-31 DIAGNOSIS — K8020 Calculus of gallbladder without cholecystitis without obstruction: Secondary | ICD-10-CM | POA: Diagnosis present

## 2022-07-31 DIAGNOSIS — R748 Abnormal levels of other serum enzymes: Secondary | ICD-10-CM | POA: Diagnosis not present

## 2022-07-31 DIAGNOSIS — Z6825 Body mass index (BMI) 25.0-25.9, adult: Secondary | ICD-10-CM

## 2022-07-31 DIAGNOSIS — R000 Tachycardia, unspecified: Secondary | ICD-10-CM | POA: Diagnosis present

## 2022-07-31 DIAGNOSIS — G3109 Other frontotemporal dementia: Secondary | ICD-10-CM | POA: Diagnosis present

## 2022-07-31 DIAGNOSIS — M810 Age-related osteoporosis without current pathological fracture: Secondary | ICD-10-CM | POA: Diagnosis present

## 2022-07-31 DIAGNOSIS — D6959 Other secondary thrombocytopenia: Secondary | ICD-10-CM | POA: Diagnosis not present

## 2022-07-31 DIAGNOSIS — Z9851 Tubal ligation status: Secondary | ICD-10-CM

## 2022-07-31 DIAGNOSIS — Z882 Allergy status to sulfonamides status: Secondary | ICD-10-CM

## 2022-07-31 DIAGNOSIS — Z4682 Encounter for fitting and adjustment of non-vascular catheter: Secondary | ICD-10-CM | POA: Diagnosis not present

## 2022-07-31 DIAGNOSIS — D649 Anemia, unspecified: Secondary | ICD-10-CM | POA: Diagnosis not present

## 2022-07-31 DIAGNOSIS — E860 Dehydration: Secondary | ICD-10-CM | POA: Diagnosis present

## 2022-07-31 DIAGNOSIS — F0280 Dementia in other diseases classified elsewhere without behavioral disturbance: Secondary | ICD-10-CM | POA: Diagnosis present

## 2022-07-31 DIAGNOSIS — K7200 Acute and subacute hepatic failure without coma: Secondary | ICD-10-CM | POA: Diagnosis not present

## 2022-07-31 DIAGNOSIS — R4587 Impulsiveness: Secondary | ICD-10-CM | POA: Diagnosis present

## 2022-07-31 DIAGNOSIS — E870 Hyperosmolality and hypernatremia: Secondary | ICD-10-CM | POA: Diagnosis not present

## 2022-07-31 DIAGNOSIS — R404 Transient alteration of awareness: Secondary | ICD-10-CM | POA: Diagnosis not present

## 2022-07-31 DIAGNOSIS — L89152 Pressure ulcer of sacral region, stage 2: Secondary | ICD-10-CM | POA: Diagnosis present

## 2022-07-31 DIAGNOSIS — R6889 Other general symptoms and signs: Secondary | ICD-10-CM | POA: Diagnosis not present

## 2022-07-31 DIAGNOSIS — R630 Anorexia: Secondary | ICD-10-CM | POA: Diagnosis not present

## 2022-07-31 DIAGNOSIS — G9341 Metabolic encephalopathy: Secondary | ICD-10-CM | POA: Diagnosis present

## 2022-07-31 DIAGNOSIS — Z8619 Personal history of other infectious and parasitic diseases: Secondary | ICD-10-CM

## 2022-07-31 DIAGNOSIS — N179 Acute kidney failure, unspecified: Secondary | ICD-10-CM | POA: Diagnosis not present

## 2022-07-31 DIAGNOSIS — Z86010 Personal history of colonic polyps: Secondary | ICD-10-CM

## 2022-07-31 DIAGNOSIS — R627 Adult failure to thrive: Secondary | ICD-10-CM | POA: Diagnosis present

## 2022-07-31 DIAGNOSIS — Z801 Family history of malignant neoplasm of trachea, bronchus and lung: Secondary | ICD-10-CM

## 2022-07-31 DIAGNOSIS — E119 Type 2 diabetes mellitus without complications: Secondary | ICD-10-CM | POA: Diagnosis not present

## 2022-07-31 DIAGNOSIS — D509 Iron deficiency anemia, unspecified: Secondary | ICD-10-CM | POA: Diagnosis present

## 2022-07-31 DIAGNOSIS — Z743 Need for continuous supervision: Secondary | ICD-10-CM | POA: Diagnosis not present

## 2022-07-31 DIAGNOSIS — Z7984 Long term (current) use of oral hypoglycemic drugs: Secondary | ICD-10-CM

## 2022-07-31 DIAGNOSIS — I10 Essential (primary) hypertension: Secondary | ICD-10-CM | POA: Diagnosis present

## 2022-07-31 DIAGNOSIS — F05 Delirium due to known physiological condition: Secondary | ICD-10-CM | POA: Diagnosis not present

## 2022-07-31 DIAGNOSIS — D696 Thrombocytopenia, unspecified: Secondary | ICD-10-CM | POA: Diagnosis not present

## 2022-07-31 DIAGNOSIS — E1100 Type 2 diabetes mellitus with hyperosmolarity without nonketotic hyperglycemic-hyperosmolar coma (NKHHC): Secondary | ICD-10-CM

## 2022-07-31 DIAGNOSIS — E1110 Type 2 diabetes mellitus with ketoacidosis without coma: Secondary | ICD-10-CM | POA: Diagnosis not present

## 2022-07-31 DIAGNOSIS — F0390 Unspecified dementia without behavioral disturbance: Secondary | ICD-10-CM | POA: Diagnosis not present

## 2022-07-31 DIAGNOSIS — Z808 Family history of malignant neoplasm of other organs or systems: Secondary | ICD-10-CM

## 2022-07-31 DIAGNOSIS — Z79899 Other long term (current) drug therapy: Secondary | ICD-10-CM

## 2022-07-31 DIAGNOSIS — Z888 Allergy status to other drugs, medicaments and biological substances status: Secondary | ICD-10-CM

## 2022-07-31 LAB — BASIC METABOLIC PANEL
Anion gap: 17 — ABNORMAL HIGH (ref 5–15)
BUN: 51 mg/dL — ABNORMAL HIGH (ref 8–23)
BUN: 45 mg/dL — ABNORMAL HIGH (ref 8–23)
CO2: 24 mmol/L (ref 22–32)
CO2: 26 mmol/L (ref 22–32)
Calcium: 10 mg/dL (ref 8.9–10.3)
Calcium: 9.6 mg/dL (ref 8.9–10.3)
Chloride: 128 mmol/L — ABNORMAL HIGH (ref 98–111)
Chloride: >130 mmol/L (ref 98–111)
Creatinine, Ser: 1.56 mg/dL — ABNORMAL HIGH (ref 0.44–1.00)
Creatinine, Ser: 1.52 mg/dL — ABNORMAL HIGH (ref 0.44–1.00)
GFR, Estimated: 35 mL/min — ABNORMAL LOW (ref >60)
GFR, Estimated: 36 mL/min — ABNORMAL LOW (ref >60)
Glucose, Bld: 345 mg/dL — ABNORMAL HIGH (ref 70–99)
Glucose, Bld: 219 mg/dL — ABNORMAL HIGH (ref 70–99)
Potassium: 3.3 mmol/L — ABNORMAL LOW (ref 3.5–5.1)
Potassium: 3.7 mmol/L (ref 3.5–5.1)
Sodium: 169 mmol/L (ref 135–145)
Sodium: 171 mmol/L (ref 135–145)

## 2022-07-31 LAB — APTT: aPTT: 25 seconds (ref 24–36)

## 2022-07-31 LAB — COMPREHENSIVE METABOLIC PANEL
ALT: 1953 U/L — ABNORMAL HIGH (ref 0–44)
AST: 1028 U/L — ABNORMAL HIGH (ref 15–41)
Albumin: 3.9 g/dL (ref 3.5–5.0)
Alkaline Phosphatase: 238 U/L — ABNORMAL HIGH (ref 38–126)
Anion gap: 25 — ABNORMAL HIGH (ref 5–15)
BUN: 74 mg/dL — ABNORMAL HIGH (ref 8–23)
CO2: 22 mmol/L (ref 22–32)
Calcium: 10.9 mg/dL — ABNORMAL HIGH (ref 8.9–10.3)
Chloride: 118 mmol/L — ABNORMAL HIGH (ref 98–111)
Creatinine, Ser: 2.47 mg/dL — ABNORMAL HIGH (ref 0.44–1.00)
GFR, Estimated: 20 mL/min — ABNORMAL LOW (ref >60)
Glucose, Bld: 815 mg/dL (ref 70–99)
Potassium: 4.2 mmol/L (ref 3.5–5.1)
Sodium: 165 mmol/L (ref 135–145)
Total Bilirubin: 1 mg/dL (ref 0.3–1.2)
Total Protein: 9.3 g/dL — ABNORMAL HIGH (ref 6.5–8.1)

## 2022-07-31 LAB — HEPATIC FUNCTION PANEL
ALT: 1395 U/L — ABNORMAL HIGH (ref 0–44)
AST: 565 U/L — ABNORMAL HIGH (ref 15–41)
Albumin: 2.9 g/dL — ABNORMAL LOW (ref 3.5–5.0)
Alkaline Phosphatase: 184 U/L — ABNORMAL HIGH (ref 38–126)
Bilirubin, Direct: <0.1 mg/dL (ref 0.0–0.2)
Total Bilirubin: 0.4 mg/dL (ref 0.3–1.2)
Total Protein: 7.3 g/dL (ref 6.5–8.1)

## 2022-07-31 LAB — I-STAT VENOUS BLOOD GAS, ED
Acid-base deficit: 3 mmol/L — ABNORMAL HIGH (ref 0.0–2.0)
Bicarbonate: 23.9 mmol/L (ref 20.0–28.0)
Calcium, Ion: 1.25 mmol/L (ref 1.15–1.40)
HCT: 49 % — ABNORMAL HIGH (ref 36.0–46.0)
Hemoglobin: 16.7 g/dL — ABNORMAL HIGH (ref 12.0–15.0)
O2 Saturation: 92 %
Potassium: 4.4 mmol/L (ref 3.5–5.1)
Sodium: 165 mmol/L (ref 135–145)
TCO2: 25 mmol/L (ref 22–32)
pCO2, Ven: 48.7 mmHg (ref 44–60)
pH, Ven: 7.299 (ref 7.25–7.43)
pO2, Ven: 70 mmHg — ABNORMAL HIGH (ref 32–45)

## 2022-07-31 LAB — CBC WITH DIFFERENTIAL/PLATELET
Abs Immature Granulocytes: 0.04 10*3/uL (ref 0.00–0.07)
Basophils Absolute: 0 10*3/uL (ref 0.0–0.1)
Basophils Relative: 0 %
Eosinophils Absolute: 0 10*3/uL (ref 0.0–0.5)
Eosinophils Relative: 0 %
HCT: 54.1 % — ABNORMAL HIGH (ref 36.0–46.0)
Hemoglobin: 16.8 g/dL — ABNORMAL HIGH (ref 12.0–15.0)
Immature Granulocytes: 0 %
Lymphocytes Relative: 16 %
Lymphs Abs: 1.9 10*3/uL (ref 0.7–4.0)
MCH: 28.4 pg (ref 26.0–34.0)
MCHC: 31.1 g/dL (ref 30.0–36.0)
MCV: 91.4 fL (ref 80.0–100.0)
Monocytes Absolute: 0.6 10*3/uL (ref 0.1–1.0)
Monocytes Relative: 5 %
Neutro Abs: 9.2 10*3/uL — ABNORMAL HIGH (ref 1.7–7.7)
Neutrophils Relative %: 79 %
Platelets: 144 10*3/uL — ABNORMAL LOW (ref 150–400)
RBC: 5.92 MIL/uL — ABNORMAL HIGH (ref 3.87–5.11)
RDW: 13.7 % (ref 11.5–15.5)
WBC: 11.7 10*3/uL — ABNORMAL HIGH (ref 4.0–10.5)
nRBC: 0 % (ref 0.0–0.2)

## 2022-07-31 LAB — URINALYSIS, ROUTINE W REFLEX MICROSCOPIC
Bacteria, UA: NONE SEEN
Bilirubin Urine: NEGATIVE
Glucose, UA: >=500 mg/dL — AB
Hgb urine dipstick: NEGATIVE
Ketones, ur: 5 mg/dL — AB
Leukocytes,Ua: NEGATIVE
Nitrite: NEGATIVE
Protein, ur: NEGATIVE mg/dL
Specific Gravity, Urine: 1.031 — ABNORMAL HIGH (ref 1.005–1.030)
pH: 5 (ref 5.0–8.0)

## 2022-07-31 LAB — GLUCOSE, CAPILLARY
Glucose-Capillary: 167 mg/dL — ABNORMAL HIGH (ref 70–99)
Glucose-Capillary: 170 mg/dL — ABNORMAL HIGH (ref 70–99)
Glucose-Capillary: 182 mg/dL — ABNORMAL HIGH (ref 70–99)
Glucose-Capillary: 189 mg/dL — ABNORMAL HIGH (ref 70–99)
Glucose-Capillary: 199 mg/dL — ABNORMAL HIGH (ref 70–99)
Glucose-Capillary: 252 mg/dL — ABNORMAL HIGH (ref 70–99)
Glucose-Capillary: 272 mg/dL — ABNORMAL HIGH (ref 70–99)
Glucose-Capillary: 358 mg/dL — ABNORMAL HIGH (ref 70–99)
Glucose-Capillary: 416 mg/dL — ABNORMAL HIGH (ref 70–99)

## 2022-07-31 LAB — LACTIC ACID, PLASMA
Lactic Acid, Venous: 3.9 mmol/L (ref 0.5–1.9)
Lactic Acid, Venous: 4.8 mmol/L (ref 0.5–1.9)
Lactic Acid, Venous: 4.3 mmol/L (ref 0.5–1.9)

## 2022-07-31 LAB — CBG MONITORING, ED
Glucose-Capillary: >600 mg/dL (ref 70–99)
Glucose-Capillary: 595 mg/dL (ref 70–99)
Glucose-Capillary: 500 mg/dL — ABNORMAL HIGH (ref 70–99)

## 2022-07-31 LAB — BETA-HYDROXYBUTYRIC ACID: Beta-Hydroxybutyric Acid: 0.52 mmol/L — ABNORMAL HIGH (ref 0.05–0.27)

## 2022-07-31 LAB — PROTIME-INR
INR: 1.4 — ABNORMAL HIGH (ref 0.8–1.2)
Prothrombin Time: 17 seconds — ABNORMAL HIGH (ref 11.4–15.2)

## 2022-07-31 LAB — MAGNESIUM: Magnesium: 2.8 mg/dL — ABNORMAL HIGH (ref 1.7–2.4)

## 2022-07-31 LAB — PHOSPHORUS: Phosphorus: 2.4 mg/dL — ABNORMAL LOW (ref 2.5–4.6)

## 2022-07-31 MED ORDER — DEXTROSE IN LACTATED RINGERS 5 % IV SOLN: INTRAVENOUS | Status: DC

## 2022-07-31 MED ORDER — LACTATED RINGERS IV BOLUS
1000.0000 mL | Freq: Once | INTRAVENOUS | Status: AC
  Administered 2022-07-31: 1000 mL via INTRAVENOUS

## 2022-07-31 MED ORDER — DEXTROSE-NACL 5-0.45 % IV SOLN: INTRAVENOUS | Status: DC

## 2022-07-31 MED ORDER — LACTATED RINGERS IV BOLUS (SEPSIS)
1000.0000 mL | Freq: Once | INTRAVENOUS | Status: AC
  Administered 2022-07-31: 1000 mL via INTRAVENOUS

## 2022-07-31 MED ORDER — SODIUM CHLORIDE 0.9 % IV SOLN
2.0000 g | INTRAVENOUS | Status: DC
  Administered 2022-07-31 – 2022-08-01 (×2): 2 g via INTRAVENOUS
  Filled 2022-07-31 (×2): qty 20

## 2022-07-31 MED ORDER — INSULIN REGULAR(HUMAN) IN NACL 100-0.9 UT/100ML-% IV SOLN
INTRAVENOUS | Status: DC
  Administered 2022-07-31: 9 [IU]/h via INTRAVENOUS
  Administered 2022-08-01: 0.7 [IU]/h via INTRAVENOUS
  Filled 2022-07-31: qty 100

## 2022-07-31 MED ORDER — POTASSIUM CHLORIDE 10 MEQ/100ML IV SOLN
10.0000 meq | INTRAVENOUS | Status: AC
  Administered 2022-07-31 (×2): 10 meq via INTRAVENOUS
  Filled 2022-07-31 (×2): qty 100

## 2022-07-31 MED ORDER — POTASSIUM CHLORIDE 10 MEQ/100ML IV SOLN
10.0000 meq | INTRAVENOUS | Status: AC
  Administered 2022-07-31 (×4): 10 meq via INTRAVENOUS
  Filled 2022-07-31 (×3): qty 100

## 2022-07-31 MED ORDER — METRONIDAZOLE 500 MG/100ML IV SOLN
500.0000 mg | Freq: Two times a day (BID) | INTRAVENOUS | Status: DC
  Administered 2022-07-31 – 2022-08-01 (×2): 500 mg via INTRAVENOUS
  Filled 2022-07-31 (×2): qty 100

## 2022-07-31 MED ORDER — LACTATED RINGERS IV SOLN: INTRAVENOUS | Status: DC

## 2022-07-31 MED ORDER — FAMOTIDINE IN NACL 20-0.9 MG/50ML-% IV SOLN
20.0000 mg | Freq: Every day | INTRAVENOUS | Status: DC
  Administered 2022-07-31 – 2022-08-02 (×3): 20 mg via INTRAVENOUS
  Filled 2022-07-31 (×3): qty 50

## 2022-07-31 MED ORDER — INSULIN REGULAR(HUMAN) IN NACL 100-0.9 UT/100ML-% IV SOLN
INTRAVENOUS | Status: DC
  Administered 2022-07-31: 10 [IU]/h via INTRAVENOUS
  Filled 2022-07-31: qty 100

## 2022-07-31 MED ORDER — CHLORHEXIDINE GLUCONATE CLOTH 2 % EX PADS
6.0000 | MEDICATED_PAD | Freq: Every day | CUTANEOUS | Status: DC
  Administered 2022-08-01 – 2022-08-04 (×5): 6 via TOPICAL

## 2022-07-31 MED ORDER — LACTATED RINGERS IV BOLUS: 1000.0000 mL | Freq: Once | INTRAVENOUS | Status: DC

## 2022-07-31 MED ORDER — DEXTROSE 50 % IV SOLN: 0.0000 mL | INTRAVENOUS | Status: DC | PRN

## 2022-07-31 MED ORDER — DOCUSATE SODIUM 100 MG PO CAPS: 100.0000 mg | ORAL_CAPSULE | Freq: Two times a day (BID) | ORAL | Status: DC | PRN

## 2022-07-31 MED ORDER — POLYETHYLENE GLYCOL 3350 17 G PO PACK: 17.0000 g | PACK | Freq: Every day | ORAL | Status: DC | PRN

## 2022-07-31 MED ORDER — HEPARIN SODIUM (PORCINE) 5000 UNIT/ML IJ SOLN
5000.0000 [IU] | Freq: Three times a day (TID) | INTRAMUSCULAR | Status: DC
  Administered 2022-07-31 – 2022-08-04 (×12): 5000 [IU] via SUBCUTANEOUS
  Filled 2022-07-31 (×12): qty 1

## 2022-07-31 MED ORDER — THIAMINE HCL 100 MG/ML IJ SOLN
100.0000 mg | Freq: Every day | INTRAMUSCULAR | Status: DC
  Administered 2022-07-31 – 2022-08-02 (×3): 100 mg via INTRAVENOUS
  Filled 2022-07-31 (×3): qty 2

## 2022-07-31 NOTE — ED Triage Notes (Signed)
Patient arrived from home by [facility] for increased weakness the past 3 days. Patient per ems normally has GCS 14, today GCS 9-10. Spouse reports that he normally helps her to bathroom but this am too weak to get out of bed. EMS found BP in 90s and received LR 600 pta. Patient does deny pain but otherwise non-verbal on assessment. Hx of UTIs.

## 2022-07-31 NOTE — Progress Notes (Signed)
[facility] Progress Note Patient Name: Victoria Gates DOB: 10-Aug-1951 MRN: [id number]   Date of Service  07/31/2022  HPI/Events of Note  Na 171 from 167 On D5 LR 125 cc/hr Ongoing K correction  eICU Interventions  Switched IVF to D5 0.45 Continue q 4 electrolyte monitoring Discussed with bedside RN     Intervention Category Intermediate Interventions: Electrolyte abnormality - evaluation and management  Judd Lien 07/31/2022, 8:32 PM

## 2022-07-31 NOTE — H&P (Signed)
Please see attested consultation note dated 07/31/2022.

## 2022-07-31 NOTE — ED Provider Notes (Signed)
[facility] EMERGENCY DEPARTMENT Provider Note   CSN: [id number] Arrival date & time: 07/31/22  0841     History  Chief Complaint  Patient presents with   Altered Mental Status    Victoria Gates is a 71 y.o. female.  Is a 71 year old female with a past medical history of multiple myeloma status post stem cell transplant in 2015, now in remission, diabetes not currently on any home medication, vascular dementia ANO x2 at baseline presenting to the emergency department with altered mental status and generalized weakness.  The patient's husband states over the last few days she has not been wanting to eat or drink and has been progressively more weak.  He states that she normally ambulates on her own and he was having to help her get up to go to the bathroom over the last day.  He states that this morning she was not able to get out of bed at all so he called 911.  He denies any recent fevers or chills, nausea or vomiting, diarrhea or constipation.  He denies any recent cough and states that she has not been complaining of any pain.  He states that she is normally conversant and a was able to let her once be known.  The history is provided by the spouse and the EMS personnel. The history is limited by the condition of the patient.  Altered Mental Status      Home Medications Prior to Admission medications   Medication Sig Start Date End Date Taking? Authorizing Provider  docusate sodium (COLACE) 100 MG capsule Take 200 mg by mouth daily.   Yes [provider]  feeding supplement, ENSURE COMPLETE, (ENSURE COMPLETE) LIQD Take 237 mLs by mouth 3 (three) times daily between meals.   Yes [provider]  lansoprazole (PREVACID) 30 MG capsule Take 30 mg by mouth daily with breakfast.    Yes [provider]  mirtazapine (REMERON) 30 MG tablet Take 1 tablet (30 mg total) by mouth at bedtime. 04/16/22  Yes Roma Schanz R, DO  Pitavastatin Calcium  4 MG TABS Take 1 tablet (4 mg total) by mouth daily. 05/03/22  Yes Roma Schanz R, DO  Potassium Chloride ER 20 MEQ TBCR Take 1 tablet by mouth daily. 01/14/22  Yes Roma Schanz R, DO  rivastigmine (EXELON) 1.5 MG capsule Take 1 capsule (1.5 mg total) by mouth 2 (two) times daily. 05/15/22  Yes Patel, Donika K, DO  Ascorbic Acid (VITAMIN C) 1000 MG tablet Take 1,000 mg by mouth daily.  Patient not taking: Reported on 07/31/2022    [provider]  Calcium 500-125 MG-UNIT TABS Take 1 tablet by mouth daily. Patient not taking: Reported on 07/31/2022    [provider]  cholecalciferol (VITAMIN D) 1000 UNITS tablet Take 1,000 Units by mouth daily. Patient not taking: Reported on 07/31/2022    [provider]  [employer] NASAL SPRAY 1 % nasal spray Place into both nostrils every 4 (four) hours. Patient not taking: Reported on 07/31/2022 06/28/22   [provider]  ibuprofen (ADVIL) 600 MG tablet Take 600 mg by mouth every 6 (six) hours as needed. Patient not taking: Reported on 07/31/2022 05/11/21   [provider]  loratadine (CLARITIN) 10 MG tablet Take 10 mg by mouth daily. Patient not taking: Reported on 07/31/2022 06/28/22   [provider]  naproxen (NAPROSYN) 500 MG tablet SMARTSIG:1 Tablet(s) By Mouth Every 12 Hours Patient not taking: Reported on 07/31/2022  06/28/22   [provider]      Allergies    Atorvastatin, Rosuvastatin, and Sulfonamide derivatives    Review of Systems   Review of Systems  Physical Exam Updated Vital Signs BP (!) 106/91   Pulse (!) 106   Temp 98 F (36.7 C) (Oral)   Resp 12   Ht _0  (1.727 m)   Wt 77.1 kg   SpO2 97%   BMI 25.85 kg/m  Physical Exam Vitals and nursing note reviewed.  Constitutional:      Appearance: She is ill-appearing. She is not diaphoretic.  HENT:     Head: Normocephalic and atraumatic.     Nose: Nose normal.     Mouth/Throat:     Mouth: Mucous membranes are dry.      Pharynx: Oropharynx is clear.  Eyes:     Extraocular Movements: Extraocular movements intact.     Conjunctiva/sclera: Conjunctivae normal.     Pupils: Pupils are equal, round, and reactive to light.  Cardiovascular:     Rate and Rhythm: Regular rhythm. Tachycardia present.     Pulses: Normal pulses.     Heart sounds: Normal heart sounds.  Pulmonary:     Effort: Pulmonary effort is normal.     Breath sounds: Normal breath sounds.  Abdominal:     General: Abdomen is flat.     Palpations: Abdomen is soft.     Tenderness: There is no abdominal tenderness.  Musculoskeletal:        General: Normal range of motion.     Cervical back: Normal range of motion and neck supple.  Skin:    General: Skin is warm and dry.     Findings: No bruising.  Neurological:     Mental Status: She is alert.     Comments: Oriented x0 Face grossly symmetrical Moving bilateral upper extremities spontaneously Withdraws and localizes to pain in bilateral lower extremities Moans to pain  Psychiatric:     Comments: Unable to assess due to altered mental status     ED Results / Procedures / Treatments   Labs (all labs ordered are listed, but only abnormal results are displayed) Labs Reviewed  LACTIC ACID, PLASMA - Abnormal; Notable for the following components:      Result Value   Lactic Acid, Venous 3.9 (*)    All other components within normal limits  COMPREHENSIVE METABOLIC PANEL - Abnormal; Notable for the following components:   Sodium 165 (*)    Chloride 118 (*)    Glucose, Bld 815 (*)    BUN 74 (*)    Creatinine, Ser 2.47 (*)    Calcium 10.9 (*)    Total Protein 9.3 (*)    AST 1,028 (*)    ALT 1,953 (*)    Alkaline Phosphatase 238 (*)    GFR, Estimated 20 (*)    Anion gap 25 (*)    All other components within normal limits  CBC WITH DIFFERENTIAL/PLATELET - Abnormal; Notable for the following components:   WBC 11.7 (*)    RBC 5.92 (*)    Hemoglobin 16.8 (*)    HCT 54.1 (*)    Platelets  144 (*)    Neutro Abs 9.2 (*)    All other components within normal limits  PROTIME-INR - Abnormal; Notable for the following components:   Prothrombin Time 17.0 (*)    INR 1.4 (*)    All other components within normal limits  URINALYSIS, ROUTINE W REFLEX MICROSCOPIC - Abnormal; Notable  for the following components:   APPearance HAZY (*)    Specific Gravity, Urine 1.031 (*)    Glucose, UA >=500 (*)    Ketones, ur 5 (*)    All other components within normal limits  GLUCOSE, CAPILLARY - Abnormal; Notable for the following components:   Glucose-Capillary 416 (*)    All other components within normal limits  GLUCOSE, CAPILLARY - Abnormal; Notable for the following components:   Glucose-Capillary 358 (*)    All other components within normal limits  GLUCOSE, CAPILLARY - Abnormal; Notable for the following components:   Glucose-Capillary 272 (*)    All other components within normal limits  I-STAT VENOUS BLOOD GAS, ED - Abnormal; Notable for the following components:   pO2, Ven 70 (*)    Acid-base deficit 3.0 (*)    Sodium 165 (*)    HCT 49.0 (*)    Hemoglobin 16.7 (*)    All other components within normal limits  CBG MONITORING, ED - Abnormal; Notable for the following components:   Glucose-Capillary >600 (*)    All other components within normal limits  CBG MONITORING, ED - Abnormal; Notable for the following components:   Glucose-Capillary 595 (*)    All other components within normal limits  CBG MONITORING, ED - Abnormal; Notable for the following components:   Glucose-Capillary 500 (*)    All other components within normal limits  CULTURE, BLOOD (ROUTINE X 2)  URINE CULTURE  APTT  MAGNESIUM  PHOSPHORUS  HEPATIC FUNCTION PANEL  LACTIC ACID, PLASMA  LACTIC ACID, PLASMA  BASIC METABOLIC PANEL  BASIC METABOLIC PANEL  BASIC METABOLIC PANEL  BETA-HYDROXYBUTYRIC ACID  BASIC METABOLIC PANEL  PROTIME-INR  COMPREHENSIVE METABOLIC PANEL  CBC    EKG EKG  Interpretation  Date/Time:  Wednesday July 31 2022 08:40:59 EDT Ventricular Rate:  119 PR Interval:  112 QRS Duration: 91 QT Interval:  367 QTC Calculation: 517 R Axis:   59 Text Interpretation: Sinus tachycardia Borderline repolarization abnormality Prolonged QT interval otherwise unchanged Confirmed by Leanord Asal ([id number]) on 07/31/2022 10:29:19 AM  Radiology CT ABDOMEN PELVIS WO CONTRAST  Result Date: 07/31/2022 CLINICAL DATA:  Fatigue and encephalopathy. Acute liver and kidney injury. History of multiple myeloma. EXAM: CT ABDOMEN AND PELVIS WITHOUT CONTRAST TECHNIQUE: Multidetector CT imaging of the abdomen and pelvis was performed following the standard protocol without IV contrast. RADIATION DOSE REDUCTION: This exam was performed according to the departmental dose-optimization program which includes automated exposure control, adjustment of the mA and/or kV according to patient size and/or use of iterative reconstruction technique. COMPARISON:  Right upper quadrant ultrasound from same day. Abdominal ultrasound dated September 25, 2010. FINDINGS: Lower chest: No acute abnormality. Mild subsegmental atelectasis at both lung bases. Hepatobiliary: No focal liver abnormality is seen. No gallstones, gallbladder wall thickening, or biliary dilatation. Pancreas: Unremarkable. No pancreatic ductal dilatation or surrounding inflammatory changes. Spleen: Normal in size without focal abnormality. Adrenals/Urinary Tract: Adrenal glands are unremarkable. Kidneys are normal, without renal calculi, focal lesion, or hydronephrosis. Bladder is unremarkable. Stomach/Bowel: Small hiatal hernia. The stomach is otherwise within normal limits. No bowel wall thickening, distention, or surrounding inflammatory changes. Normal appendix. Vascular/Lymphatic: Aortic atherosclerosis. No enlarged abdominal or pelvic lymph nodes. Reproductive: Uterus and bilateral adnexa are unremarkable. Other: No abdominal wall hernia  or abnormality. No abdominopelvic ascites. No pneumoperitoneum. Musculoskeletal: Multiple scattered lucent lesions in the visualized spine and pelvis. No acute fracture. IMPRESSION: 1. No acute intra-abdominal process. 2. Multiple scattered lucent lesions in the visualized spine and  pelvis, consistent with history of multiple myeloma. 3.  Aortic Atherosclerosis (ICD10-I70.0). Electronically Signed   By: Titus Dubin M.D.   On: 07/31/2022 13:39   CT HEAD WO CONTRAST (5MM)  Result Date: 07/31/2022 CLINICAL DATA:  Mental status change, persistent or worsening EXAM: CT HEAD WITHOUT CONTRAST TECHNIQUE: Contiguous axial images were obtained from the base of the skull through the vertex without intravenous contrast. RADIATION DOSE REDUCTION: This exam was performed according to the departmental dose-optimization program which includes automated exposure control, adjustment of the mA and/or kV according to patient size and/or use of iterative reconstruction technique. COMPARISON:  CT head 04/16/2021. FINDINGS: Brain: No evidence of acute infarction, hemorrhage, hydrocephalus, extra-axial collection or mass lesion/mass effect. Vascular: Calcific atherosclerosis. Skull: No acute fracture.  Hyperostosis frontalis. Sinuses/Orbits: Clear visualized sinuses. No acute orbital findings. Other: Cerumen in bilateral EACs.  No mastoid effusions. IMPRESSION: No evidence of acute intracranial abnormality. Electronically Signed   By: Margaretha Sheffield M.D.   On: 07/31/2022 13:21   US Abdomen Limited RUQ (LIVER/GB)  Result Date: 07/31/2022 CLINICAL DATA:  Elevated ALT level. EXAM: ULTRASOUND ABDOMEN LIMITED RIGHT UPPER QUADRANT COMPARISON:  September 25, 2010. FINDINGS: Gallbladder: There is the suggestion of a large stone or tumefactive sludge within the gallbladder lumen. No gallbladder wall thickening or pericholecystic fluid is noted. No sonographic Murphy's sign is noted. Common bile duct: Diameter: 4 mm which is within  normal limits. Liver: No focal lesion identified. Increased echogenicity of hepatic parenchyma is noted suggesting hepatic steatosis. Portal vein is patent on color Doppler imaging with normal direction of blood flow towards the liver. Other: None. IMPRESSION: There is either a large gallstone or tumefactive sludge seen within the gallbladder lumen. No definite gallbladder wall thickening or sonographic Murphy's sign is noted. Probable hepatic steatosis. Electronically Signed   By: Marijo Conception M.D.   On: 07/31/2022 11:14   DG Chest Port 1 View  Result Date: 07/31/2022 CLINICAL DATA:  Pt exam order states amsQuestionable sepsis - evaluate for abnormality EXAM: PORTABLE CHEST 1 VIEW COMPARISON:  None Available. FINDINGS: Normal mediastinum and cardiac silhouette. Normal pulmonary vasculature. No evidence of effusion, infiltrate, or pneumothorax. No acute bony abnormality. IMPRESSION: No acute cardiopulmonary process. Electronically Signed   By: Suzy Bouchard M.D.   On: 07/31/2022 09:05    Procedures .Critical Care  Performed by: Kemper Durie, DO Authorized by: Kemper Durie, DO   Critical care provider statement:    Critical care time (minutes):  45   Critical care time was exclusive of:  Separately billable procedures and treating other patients   Critical care was necessary to treat or prevent imminent or life-threatening deterioration of the following conditions:  Endocrine crisis, dehydration and metabolic crisis   Critical care was time spent personally by me on the following activities:  Development of treatment plan with patient or surrogate, discussions with consultants, discussions with primary provider, evaluation of patient's response to treatment, examination of patient, obtaining history from patient or surrogate, ordering and performing treatments and interventions, ordering and review of laboratory studies, ordering and review of radiographic studies, pulse oximetry,  re-evaluation of patient's condition and review of old charts   I assumed direction of critical care for this patient from another provider in my specialty: no     Care discussed with: admitting provider       Medications Ordered in ED Medications  cefTRIAXone (ROCEPHIN) 2 g in sodium chloride 0.9 % 100 mL IVPB (0 g Intravenous Stopped 07/31/22  1353)  metroNIDAZOLE (FLAGYL) IVPB 500 mg (500 mg Intravenous New Bag/Given 07/31/22 1431)  docusate sodium (COLACE) capsule 100 mg (has no administration in time range)  polyethylene glycol (MIRALAX / GLYCOLAX) packet 17 g (has no administration in time range)  heparin injection 5,000 Units (5,000 Units Subcutaneous Given 07/31/22 1426)  famotidine (PEPCID) IVPB 20 mg premix (20 mg Intravenous New Bag/Given 07/31/22 1633)  lactated ringers infusion ( Intravenous New Bag/Given 07/31/22 1517)  dextrose 5 % in lactated ringers infusion (has no administration in time range)  insulin regular, human (MYXREDLIN) 100 units/ 100 mL infusion (7 Units/hr Intravenous Rate/Dose Change 07/31/22 1629)  dextrose 50 % solution 0-50 mL (has no administration in time range)  thiamine (VITAMIN B1) injection 100 mg (100 mg Intravenous Given 07/31/22 1426)  lactated ringers bolus 1,000 mL (0 mLs Intravenous Stopped 07/31/22 1156)  lactated ringers bolus 1,000 mL (0 mLs Intravenous Stopped 07/31/22 1400)  potassium chloride 10 mEq in 100 mL IVPB (0 mEq Intravenous Stopped 07/31/22 1401)  lactated ringers bolus 1,000 mL (1,000 mLs Intravenous New Bag/Given 07/31/22 1354)    ED Course/ Medical Decision Making/ A&P Clinical Course as of 07/31/22 1703  Wed Jul 31, 2022  1050 Patient is found to be extremely hyperglycemic with a glucose of 815 as well as hypernatremic with a sodium of 165 is corrected to 176 due to her hyperglycemia.  She has an AKI and transaminitis, concern for multiorgan failure secondary to her dehydration.  She does have an elevated anion gap in addition to lactic  acidosis however her presentation is more concerning for likely HHS versus DKA.  BHB, ammonia and osmolality will be performed.  She will have a right upper quadrant ultrasound performed to evaluate for gallbladder dysfunction causing her symptoms.  She will require admission.  Family was updated at bedside. [VK]  1116 Critical care will evaluate the patient at bedside to determine disposition to ICU versus stepdown unit. [VK]    Clinical Course User Index [VK] Kemper Durie, DO                           Medical Decision Making This patient presents to the ED with chief complaint(s) of AMS with pertinent past medical history of multiple myeloma in remission and prior diabetes not currently on medication which further complicates the presenting complaint. The complaint involves an extensive differential diagnosis and also carries with it a high risk of complications and morbidity.    The differential diagnosis includes hypo or hyperglycemia, electrolyte abnormality, dehydration, sepsis, ACS, arrhythmia, ICH or mass effect less likely as no history of trauma no focal neurologic deficits  Additional history obtained: Additional history obtained from family and spouse Records reviewed Primary Care Documents  ED Course and Reassessment: Upon patient's arrival to the emergency department she is awake but is nonconversant, moving all 4 extremities.  She does appear significantly dry on exam and is tachycardic and was started on IV fluids.  Labs, EKG, chest x-ray and urine studies will be performed for further evaluation of her altered mental status.  Independent labs interpretation:  The following labs were independently interpreted: Hyperglycemia, lactic acidosis, hypernatremia, transaminitis, AKI  Independent visualization of imaging: - I independently visualized the following imaging with scope of interpretation limited to determining acute life threatening conditions related to emergency  care: Chest x-ray, which revealed no acute disease  Consultation: - Consulted or discussed management/test interpretation w/ external professional: Critical care  Consideration  for admission or further workup: Requires admission for her significant metabolic derangements causing her altered mental status Social Determinants of health: N/A    Amount and/or Complexity of Data Reviewed Labs: ordered. Radiology: ordered. ECG/medicine tests: ordered.  Risk Prescription drug management. Decision regarding hospitalization.          Final Clinical Impression(s) / ED Diagnoses Final diagnoses:  Altered mental status, unspecified altered mental status type  Lactic acidosis  Hyperosmolar hyperglycemic state (HHS) ([place])  Transaminitis  AKI (acute kidney injury) ([place])  Hypernatremia    Rx / DC Orders ED Discharge Orders     None         Kemper Durie, DO 07/31/22 1704

## 2022-07-31 NOTE — Consult Note (Addendum)
NAME:  Victoria Gates, MRN:  [id number], DOB:  1951-01-15, LOS: 0 ADMISSION DATE:  07/31/2022, CONSULTATION DATE:  07/31/2022 REFERRING MD:  Maylon Peppers, CHIEF COMPLAINT:  DKA   History of Present Illness:  71 yo female with known DM, type II, GERD, multiple myeloma, hyperlipidemia, iron deficiency anemia admitted with metabolic acidosis, AKI, and acute liver injury.  Patient presented to ED via EMS with complaints of weakness x 3 days associated with encephalopathy.  SBP in the 90's and she received 600 ml of LR via EMS.  On arrival to ED, VS: Temp: 98.2, HR: 119, RR: 32, BP: 107/87 with SPO2 93% on room air. She received an additional 1 L of LR in the ED.  Labs obtained and initial BG > 600 with ph of 7.299 on VBG.    Husband at bedside.  He states Monday he went for a colonoscopy, when he dropped off his wife she was in her usual state of health.  Upon picking her up after the colonoscopy she was more altered than normal. Over the last 2 days she has continued to deteriorate to this morning when he was unable to get her up.    Pertinent  Medical History   Past Medical History:  Diagnosis Date   Allergic rhinitis 01/02/2009   Bradycardia 04/29/2016   Cervical Radiculopathy 02/01/2010   Left side   Chest pain 04/15/2012   Ex MV: Ex time 10 mins, EF 63%, no ischemia/infarct. Occas PAC's/PVC's.   Diabetes mellitus, type II 03/18/2008   Encounter for antineoplastic chemotherapy 04/29/2016   Esophageal Stricture 04/15/2007   s/p dilitation   GERD (gastroesophageal reflux disease) 09/21/2010   Herpes zoster 11/05/2010   Hiatal Hernia 04/15/2007   History of colonic polyps 04/24/2007   Hyperplastic only   Hypercholesterolemia 07/03/2009   Hypokalemia 11/30/2015   Iron deficiency anemia 04/24/2007   Multiple myeloma ([place]) 02/2014   Osteoporosis 04/24/2007     Significant Hospital Events: Including procedures, antibiotic start and stop dates in addition to other pertinent events   Admit to  hospital with DKA  Interim History / Subjective:  New consult  Objective   Blood pressure 119/85, pulse (!) 113, temperature 98.2 F (36.8 C), temperature source Rectal, resp. rate 19, height _0  (1.727 m), weight 77.1 kg, SpO2 98 %.        Intake/Output Summary (Last 24 hours) at 07/31/2022 1126 Last data filed at 07/31/2022 0931 Gross per 24 hour  Intake --  Output 600 ml  Net -600 ml   Filed Weights   07/31/22 0905  Weight: 77.1 kg    Examination: General: Lethargic but arouses and intermittently follows commands.  HENT: AT/[place] Lungs: Symmetric expansion, lungs clear, no increase work of breathing on exam, tachypnea noted on previous vital records Cardiovascular: SR on telemetry, warm and well perfused, no edema and palpable pulses Abdomen: Soft, tender, non distended Extremities: No deformities Neuro: Unable to identify husband at bedside, intermittently follows commands, will not move feet to command but w/d briskly to stimulation.   Resolved Hospital Problem list     Assessment & Plan:  Anion gap acidosis --Suspect multifactorial with AKI, DKA, and elevated lactate. Concern for poor po intake and only source of nutrition Ensure.   DKA with known DM type II -- Criteria: Hyperglycemia, Urine Ketones, ph 7.29 --Etiology: Unclear what precipitated.  Only source of nutrition appears to be Ensure.  --Management: DKA protocol: Insulin infusion, IVF resuscitation, serial BMPs, IVF to change to dextrose containing once <  250 --DM is diet controlled at home --Beta hydroxybutyric acid level pending  Acute encephalopathy --Etiology: Toxic metabolic. Hypernatremia, Elevated LFTs, AKI, ? Underlying infectious process --Supportive care. Will obtain CTH since going for CT Abdomen and Pelvis  Lactic acidosis --Likely due to volume depletion.  Will trend. Infectious work up in progress and will start empiric abx with Rocephin/Flayl x 24 - 48 hour with rapid de escalation if no  findings of infectious process.  Acute liver injury --large gallstone or tumefactive sludge seen within the gallbladder lumen. No definite gallbladder wall thickening or sonographic Murphy's sign is noted. Portal vein patent --Send salicylate and acetaminophen level --Obtain CT Abdomen and pelvis w/o contrast in setting of AKI --Continue to trend  Hypernatremia -- Etiology: Suspect volume depletion -- Continue IVF resuscitation, patient has received 1.6 L of IVF boluses, will give additional 2 L LR now.  -- Serial BMP's ordered  Acute kidney injury -- Baseline creatinine: 0.8-0.9 -- Creatinine on presentation 2.47 -- Foley placement and IVF resuscitation  Sinus Tachycardia: --Multifactorial: Continue fluid resuscitation  Frontotemporal dementia --Home meds: remeron --Able to perform basic ADLs --NPO, holding meds  Multiple myeloma --Completed chemotherapy in stem cell transplant in 2015 --Follows  in clinic for observation with no concerns for disease progression   Best Practice (right click and "Reselect all SmartList Selections" daily)   Diet/type: NPO DVT prophylaxis: prophylactic heparin  GI prophylaxis: H2B Lines: N/A Foley:  Yes, and it is still needed Code Status:  full code Last date of multidisciplinary goals of care discussion [Husband at bedside]  Labs   CBC: Recent Labs  Lab 07/31/22 0914 07/31/22 1004  WBC 11.7*  --   NEUTROABS 9.2*  --   HGB 16.8* 16.7*  HCT 54.1* 49.0*  MCV 91.4  --   PLT 144*  --     Basic Metabolic Panel: Recent Labs  Lab 07/31/22 0914 07/31/22 1004  NA 165* 165*  K 4.2 4.4  CL 118*  --   CO2 22  --   GLUCOSE 815*  --   BUN 74*  --   CREATININE 2.47*  --   CALCIUM 10.9*  --    GFR: Estimated Creatinine Clearance: 22.8 mL/min (A) (by C-G formula based on SCr of 2.47 mg/dL (H)). Recent Labs  Lab 07/31/22 0914  WBC 11.7*  LATICACIDVEN 3.9*    Liver Function Tests: Recent Labs  Lab 07/31/22 0914  AST  1,028*  ALT 1,953*  ALKPHOS 238*  BILITOT 1.0  PROT 9.3*  ALBUMIN 3.9   No results for input(s): "LIPASE", "AMYLASE" in the last 168 hours. No results for input(s): "AMMONIA" in the last 168 hours.  ABG    Component Value Date/Time   HCO3 23.9 07/31/2022 1004   TCO2 25 07/31/2022 1004   ACIDBASEDEF 3.0 (H) 07/31/2022 1004   O2SAT 92 07/31/2022 1004     Coagulation Profile: Recent Labs  Lab 07/31/22 0914  INR 1.4*    Cardiac Enzymes: No results for input(s): "CKTOTAL", "CKMB", "CKMBINDEX", "TROPONINI" in the last 168 hours.  HbA1C: Hemoglobin A1C  Date/Time Value Ref Range Status  09/12/2021 07:38 AM 5.7 (A) 4.0 - 5.6 % Final  09/06/2020 07:37 AM 5.6 4.0 - 5.6 % Final   Hgb A1c MFr Bld  Date/Time Value Ref Range Status  04/03/2021 10:05 AM 5.9 4.6 - 6.5 % Final    Comment:    Glycemic Control Guidelines for People with Diabetes:Non Diabetic:  <6%Goal of Therapy: <7%Additional Action Suggested:  >8%  01/10/2020 10:19 AM 5.8 4.6 - 6.5 % Final    Comment:    Glycemic Control Guidelines for People with Diabetes:Non Diabetic:  <6%Goal of Therapy: <7%Additional Action Suggested:  >8%     CBG: Recent Labs  Lab 07/31/22 0940 07/31/22 1125  GLUCAP >600* 595*    Review of Systems:   Unable to obtain due to clinical status  Past Medical History:  She,  has a past medical history of Allergic rhinitis (01/02/2009), Bradycardia (04/29/2016), Cervical Radiculopathy (02/01/2010), Chest pain (04/15/2012), Diabetes mellitus, type II (03/18/2008), Encounter for antineoplastic chemotherapy (04/29/2016), Esophageal Stricture (04/15/2007), GERD (gastroesophageal reflux disease) (09/21/2010), Herpes zoster (11/05/2010), Hiatal Hernia (04/15/2007), History of colonic polyps (04/24/2007), Hypercholesterolemia (07/03/2009), Hypokalemia (11/30/2015), Iron deficiency anemia (04/24/2007), Multiple myeloma ([place]) (02/2014), and Osteoporosis (04/24/2007).   Surgical History:   Past Surgical  History:  Procedure Laterality Date   ESOPHAGOGASTRODUODENOSCOPY  04/15/2007   TUBAL LIGATION       Social History:   reports that she has never smoked. She has never used smokeless tobacco. She reports that she does not drink alcohol and does not use drugs.   Family History:  Her family history includes ALS in her sister; Brain cancer in her mother; Diabetes in her brother and sister; Heart attack in her sister; Heart disease in her brother, father, mother, and sister; Lung cancer in her father. There is no history of Cancer, Colon cancer, Esophageal cancer, Rectal cancer, or Stomach cancer.   Allergies Allergies  Allergen Reactions   Atorvastatin     REACTION: myalgias   Rosuvastatin     REACTION: myalgias   Sulfonamide Derivatives     Unknown reaction      Home Medications  Prior to Admission medications   Medication Sig Start Date End Date Taking? Authorizing Provider  docusate sodium (COLACE) 100 MG capsule Take 200 mg by mouth daily.   Yes [provider]  feeding supplement, ENSURE COMPLETE, (ENSURE COMPLETE) LIQD Take 237 mLs by mouth 3 (three) times daily between meals.   Yes [provider]  lansoprazole (PREVACID) 30 MG capsule Take 30 mg by mouth daily with breakfast.    Yes [provider]  mirtazapine (REMERON) 30 MG tablet Take 1 tablet (30 mg total) by mouth at bedtime. 04/16/22  Yes Roma Schanz R, DO  Pitavastatin Calcium 4 MG TABS Take 1 tablet (4 mg total) by mouth daily. 05/03/22  Yes Roma Schanz R, DO  Potassium Chloride ER 20 MEQ TBCR Take 1 tablet by mouth daily. 01/14/22  Yes Roma Schanz R, DO  rivastigmine (EXELON) 1.5 MG capsule Take 1 capsule (1.5 mg total) by mouth 2 (two) times daily. 05/15/22  Yes Patel, Donika K, DO  Ascorbic Acid (VITAMIN C) 1000 MG tablet Take 1,000 mg by mouth daily.  Patient not taking: Reported on 07/31/2022    [provider]  Calcium 500-125 MG-UNIT TABS Take 1 tablet by  mouth daily. Patient not taking: Reported on 07/31/2022    [provider]  cholecalciferol (VITAMIN D) 1000 UNITS tablet Take 1,000 Units by mouth daily. Patient not taking: Reported on 07/31/2022    [provider]  [employer] NASAL SPRAY 1 % nasal spray Place into both nostrils every 4 (four) hours. Patient not taking: Reported on 07/31/2022 06/28/22   [provider]  ibuprofen (ADVIL) 600 MG tablet Take 600 mg by mouth every 6 (six) hours as needed. Patient not taking: Reported on 07/31/2022 05/11/21   [provider]  loratadine (  CLARITIN) 10 MG tablet Take 10 mg by mouth daily. Patient not taking: Reported on 07/31/2022 06/28/22   [provider]  naproxen (NAPROSYN) 500 MG tablet SMARTSIG:1 Tablet(s) By Mouth Every 12 Hours Patient not taking: Reported on 07/31/2022 06/28/22   [provider]     Critical care time: 60 min    Paulita Fujita, ACNP

## 2022-07-31 NOTE — Progress Notes (Signed)
Date and time results received: 07/31/22 8:16 PM (use smartphrase ".now" to insert current time)  Test: Na and Cl Critical Value: Na 171 Cl >130  Name of Provider Notified: E-link   Orders Received? Or Actions Taken?:

## 2022-07-31 NOTE — ED Notes (Signed)
Dr. Maylon Peppers notified of elevated lactic, sodium, and glucose

## 2022-07-31 NOTE — ED Notes (Signed)
Patient transported to CT 

## 2022-08-01 ENCOUNTER — Inpatient Hospital Stay (HOSPITAL_COMMUNITY): Payer: BC Managed Care – PPO

## 2022-08-01 ENCOUNTER — Inpatient Hospital Stay: Payer: Self-pay

## 2022-08-01 DIAGNOSIS — G9341 Metabolic encephalopathy: Secondary | ICD-10-CM | POA: Diagnosis not present

## 2022-08-01 LAB — CBC
HCT: 46.4 % — ABNORMAL HIGH (ref 36.0–46.0)
Hemoglobin: 14.1 g/dL (ref 12.0–15.0)
MCH: 27.8 pg (ref 26.0–34.0)
MCHC: 30.4 g/dL (ref 30.0–36.0)
MCV: 91.5 fL (ref 80.0–100.0)
Platelets: 99 10*3/uL — ABNORMAL LOW (ref 150–400)
RBC: 5.07 MIL/uL (ref 3.87–5.11)
RDW: 13.9 % (ref 11.5–15.5)
WBC: 13.3 10*3/uL — ABNORMAL HIGH (ref 4.0–10.5)
nRBC: 0 % (ref 0.0–0.2)

## 2022-08-01 LAB — COMPREHENSIVE METABOLIC PANEL
ALT: 1093 U/L — ABNORMAL HIGH (ref 0–44)
AST: 376 U/L — ABNORMAL HIGH (ref 15–41)
Albumin: 2.8 g/dL — ABNORMAL LOW (ref 3.5–5.0)
Alkaline Phosphatase: 159 U/L — ABNORMAL HIGH (ref 38–126)
Anion gap: 9 (ref 5–15)
BUN: 33 mg/dL — ABNORMAL HIGH (ref 8–23)
CO2: 29 mmol/L (ref 22–32)
Calcium: 9.1 mg/dL (ref 8.9–10.3)
Chloride: 130 mmol/L — ABNORMAL HIGH (ref 98–111)
Creatinine, Ser: 1.27 mg/dL — ABNORMAL HIGH (ref 0.44–1.00)
GFR, Estimated: 45 mL/min — ABNORMAL LOW (ref >60)
Glucose, Bld: 178 mg/dL — ABNORMAL HIGH (ref 70–99)
Potassium: 3.5 mmol/L (ref 3.5–5.1)
Sodium: 168 mmol/L (ref 135–145)
Total Bilirubin: 0.4 mg/dL (ref 0.3–1.2)
Total Protein: 6.7 g/dL (ref 6.5–8.1)

## 2022-08-01 LAB — BASIC METABOLIC PANEL
Anion gap: 8 (ref 5–15)
BUN: 35 mg/dL — ABNORMAL HIGH (ref 8–23)
BUN: 29 mg/dL — ABNORMAL HIGH (ref 8–23)
BUN: 23 mg/dL (ref 8–23)
CO2: 26 mmol/L (ref 22–32)
CO2: 25 mmol/L (ref 22–32)
CO2: 23 mmol/L (ref 22–32)
Calcium: 9.3 mg/dL (ref 8.9–10.3)
Calcium: 9.1 mg/dL (ref 8.9–10.3)
Calcium: 8.3 mg/dL — ABNORMAL LOW (ref 8.9–10.3)
Chloride: >130 mmol/L (ref 98–111)
Chloride: >130 mmol/L (ref 98–111)
Chloride: 124 mmol/L — ABNORMAL HIGH (ref 98–111)
Creatinine, Ser: 1.26 mg/dL — ABNORMAL HIGH (ref 0.44–1.00)
Creatinine, Ser: 1.19 mg/dL — ABNORMAL HIGH (ref 0.44–1.00)
Creatinine, Ser: 1.1 mg/dL — ABNORMAL HIGH (ref 0.44–1.00)
GFR, Estimated: 46 mL/min — ABNORMAL LOW (ref >60)
GFR, Estimated: 49 mL/min — ABNORMAL LOW (ref >60)
GFR, Estimated: 54 mL/min — ABNORMAL LOW (ref >60)
Glucose, Bld: 171 mg/dL — ABNORMAL HIGH (ref 70–99)
Glucose, Bld: 236 mg/dL — ABNORMAL HIGH (ref 70–99)
Glucose, Bld: 330 mg/dL — ABNORMAL HIGH (ref 70–99)
Potassium: 4.1 mmol/L (ref 3.5–5.1)
Potassium: 4.2 mmol/L (ref 3.5–5.1)
Potassium: 3.4 mmol/L — ABNORMAL LOW (ref 3.5–5.1)
Sodium: 168 mmol/L (ref 135–145)
Sodium: 168 mmol/L (ref 135–145)
Sodium: 155 mmol/L — ABNORMAL HIGH (ref 135–145)

## 2022-08-01 LAB — GLUCOSE, CAPILLARY
Glucose-Capillary: 103 mg/dL — ABNORMAL HIGH (ref 70–99)
Glucose-Capillary: 125 mg/dL — ABNORMAL HIGH (ref 70–99)
Glucose-Capillary: 133 mg/dL — ABNORMAL HIGH (ref 70–99)
Glucose-Capillary: 151 mg/dL — ABNORMAL HIGH (ref 70–99)
Glucose-Capillary: 156 mg/dL — ABNORMAL HIGH (ref 70–99)
Glucose-Capillary: 162 mg/dL — ABNORMAL HIGH (ref 70–99)
Glucose-Capillary: 168 mg/dL — ABNORMAL HIGH (ref 70–99)
Glucose-Capillary: 172 mg/dL — ABNORMAL HIGH (ref 70–99)
Glucose-Capillary: 188 mg/dL — ABNORMAL HIGH (ref 70–99)
Glucose-Capillary: 188 mg/dL — ABNORMAL HIGH (ref 70–99)
Glucose-Capillary: 204 mg/dL — ABNORMAL HIGH (ref 70–99)
Glucose-Capillary: 273 mg/dL — ABNORMAL HIGH (ref 70–99)

## 2022-08-01 LAB — PHOSPHORUS: Phosphorus: 2.6 mg/dL (ref 2.5–4.6)

## 2022-08-01 LAB — PROTIME-INR
INR: 1.5 — ABNORMAL HIGH (ref 0.8–1.2)
Prothrombin Time: 18 seconds — ABNORMAL HIGH (ref 11.4–15.2)

## 2022-08-01 LAB — URINE CULTURE: Culture: NO GROWTH

## 2022-08-01 LAB — HEMOGLOBIN A1C
Hgb A1c MFr Bld: 9.1 % — ABNORMAL HIGH (ref 4.8–5.6)
Mean Plasma Glucose: 214.47 mg/dL

## 2022-08-01 LAB — LACTIC ACID, PLASMA: Lactic Acid, Venous: 1.3 mmol/L (ref 0.5–1.9)

## 2022-08-01 LAB — MAGNESIUM: Magnesium: 2.4 mg/dL (ref 1.7–2.4)

## 2022-08-01 MED ORDER — OSMOLITE 1.2 CAL PO LIQD
1000.0000 mL | ORAL | Status: DC
  Administered 2022-08-01 – 2022-08-03 (×3): 1000 mL
  Filled 2022-08-01 (×7): qty 1000

## 2022-08-01 MED ORDER — POTASSIUM CHLORIDE 10 MEQ/100ML IV SOLN
10.0000 meq | INTRAVENOUS | Status: AC
  Administered 2022-08-01 (×4): 10 meq via INTRAVENOUS
  Filled 2022-08-01 (×4): qty 100

## 2022-08-01 MED ORDER — FREE WATER
200.0000 mL | Status: DC
  Administered 2022-08-01 – 2022-08-02 (×5): 200 mL

## 2022-08-01 MED ORDER — SODIUM CHLORIDE 0.45 % IV SOLN: INTRAVENOUS | Status: DC

## 2022-08-01 MED ORDER — MIRTAZAPINE 15 MG PO TBDP
15.0000 mg | ORAL_TABLET | Freq: Every day | ORAL | Status: DC
  Filled 2022-08-01: qty 1

## 2022-08-01 MED ORDER — SODIUM CHLORIDE 0.9% FLUSH
10.0000 mL | Freq: Two times a day (BID) | INTRAVENOUS | Status: DC
  Administered 2022-08-01 – 2022-08-04 (×7): 10 mL

## 2022-08-01 MED ORDER — OLANZAPINE 10 MG IM SOLR
2.5000 mg | Freq: Once | INTRAMUSCULAR | Status: AC | PRN
  Administered 2022-08-01: 2.5 mg via INTRAMUSCULAR
  Filled 2022-08-01: qty 10

## 2022-08-01 MED ORDER — POTASSIUM CHLORIDE 10 MEQ/100ML IV SOLN
10.0000 meq | INTRAVENOUS | Status: DC
  Filled 2022-08-01 (×2): qty 100

## 2022-08-01 MED ORDER — SODIUM CHLORIDE 0.9% FLUSH
10.0000 mL | INTRAVENOUS | Status: DC | PRN
  Administered 2022-08-03: 10 mL

## 2022-08-01 MED ORDER — INSULIN ASPART 100 UNIT/ML IJ SOLN
0.0000 [IU] | INTRAMUSCULAR | Status: DC
  Administered 2022-08-01: 8 [IU] via SUBCUTANEOUS
  Administered 2022-08-01 – 2022-08-02 (×4): 3 [IU] via SUBCUTANEOUS
  Administered 2022-08-02: 2 [IU] via SUBCUTANEOUS
  Administered 2022-08-02: 3 [IU] via SUBCUTANEOUS
  Administered 2022-08-02: 2 [IU] via SUBCUTANEOUS
  Administered 2022-08-03: 8 [IU] via SUBCUTANEOUS
  Administered 2022-08-03: 5 [IU] via SUBCUTANEOUS
  Administered 2022-08-03 (×2): 8 [IU] via SUBCUTANEOUS
  Administered 2022-08-03: 5 [IU] via SUBCUTANEOUS
  Administered 2022-08-03: 8 [IU] via SUBCUTANEOUS
  Administered 2022-08-04: 11 [IU] via SUBCUTANEOUS
  Administered 2022-08-04: 8 [IU] via SUBCUTANEOUS
  Administered 2022-08-04: 15 [IU] via SUBCUTANEOUS
  Administered 2022-08-04: 5 [IU] via SUBCUTANEOUS

## 2022-08-01 MED ORDER — OLANZAPINE 10 MG IM SOLR
2.5000 mg | Freq: Once | INTRAMUSCULAR | Status: AC | PRN
  Administered 2022-08-02: 2.5 mg via INTRAMUSCULAR
  Filled 2022-08-01: qty 10

## 2022-08-01 MED ORDER — STERILE WATER FOR INJECTION IJ SOLN
INTRAMUSCULAR | Status: AC
  Administered 2022-08-01: 2.1 mL
  Filled 2022-08-01: qty 10

## 2022-08-01 MED ORDER — POTASSIUM CHLORIDE 10 MEQ/50ML IV SOLN
10.0000 meq | INTRAVENOUS | Status: AC
  Administered 2022-08-01 – 2022-08-02 (×2): 10 meq via INTRAVENOUS
  Filled 2022-08-01 (×2): qty 50

## 2022-08-01 MED ORDER — INSULIN DETEMIR 100 UNIT/ML ~~LOC~~ SOLN
12.0000 [IU] | SUBCUTANEOUS | Status: DC
  Administered 2022-08-01: 12 [IU] via SUBCUTANEOUS
  Filled 2022-08-01 (×4): qty 0.12

## 2022-08-01 NOTE — Progress Notes (Signed)
NAME:  Victoria Gates, MRN:  [id number], DOB:  08-Mar-1951, LOS: 1 ADMISSION DATE:  07/31/2022, CONSULTATION DATE:  07/31/2022 REFERRING MD:  Maylon Peppers, CHIEF COMPLAINT:  DKA   History of Present Illness:  71 yo female with known DM, type II, GERD, multiple myeloma, hyperlipidemia, iron deficiency anemia admitted with metabolic acidosis, AKI, and acute liver injury.  Patient presented to ED via EMS with complaints of weakness x 3 days associated with encephalopathy.  SBP in the 90's and she received 600 ml of LR via EMS.  On arrival to ED, VS: Temp: 98.2, HR: 119, RR: 32, BP: 107/87 with SPO2 93% on room air. She received an additional 1 L of LR in the ED.  Labs obtained and initial BG > 600 with ph of 7.299 on VBG.    Husband at bedside.  He states Monday he went for a colonoscopy, when he dropped off his wife she was in her usual state of health.  Upon picking her up after the colonoscopy she was more altered than normal. Over the last 2 days she has continued to deteriorate to this morning when he was unable to get her up.    Pertinent  Medical History   Past Medical History:  Diagnosis Date   Allergic rhinitis 01/02/2009   Bradycardia 04/29/2016   Cervical Radiculopathy 02/01/2010   Left side   Chest pain 04/15/2012   Ex MV: Ex time 10 mins, EF 63%, no ischemia/infarct. Occas PAC's/PVC's.   Diabetes mellitus, type II 03/18/2008   Encounter for antineoplastic chemotherapy 04/29/2016   Esophageal Stricture 04/15/2007   s/p dilitation   GERD (gastroesophageal reflux disease) 09/21/2010   Herpes zoster 11/05/2010   Hiatal Hernia 04/15/2007   History of colonic polyps 04/24/2007   Hyperplastic only   Hypercholesterolemia 07/03/2009   Hypokalemia 11/30/2015   Iron deficiency anemia 04/24/2007   Multiple myeloma ([place]) 02/2014   Osteoporosis 04/24/2007     Significant Hospital Events: Including procedures, antibiotic start and stop dates in addition to other pertinent events   Admit to  hospital with DKA CT abdomen pelvis 11/1 >> no intra-abdominal process, multiple scattered lucent lesions in the visualized spine consistent with her multiple myeloma  Interim History / Subjective:  Glucose has improved,, anion gap closed.  Transition off insulin infusion early a.m. 11/2 I/O+ 1.1 L total   Objective   Blood pressure 124/78, pulse 85, temperature 98.8 F (37.1 C), temperature source Axillary, resp. rate 18, height _0  (1.727 m), weight 75.3 kg, SpO2 98 %.        Intake/Output Summary (Last 24 hours) at 08/01/2022 0718 Last data filed at 08/01/2022 0600 Gross per 24 hour  Intake 2746.97 ml  Output 1640 ml  Net 1106.97 ml   Filed Weights   07/31/22 0905 08/01/22 0501  Weight: 77.1 kg 75.3 kg    Examination: General: Lethargic but arouses and intermittently follows commands.  HENT: AT/[place] Lungs: Symmetric expansion, lungs clear, no increase work of breathing on exam, tachypnea noted on previous vital records Cardiovascular: SR on telemetry, warm and well perfused, no edema and palpable pulses Abdomen: Soft, tender, non distended Extremities: No deformities Neuro: Unable to identify husband at bedside, intermittently follows commands, will not move feet to command but w/d briskly to stimulation.   Resolved Hospital Problem list     Assessment & Plan:  Anion gap acidosis (lactic acidosis + renal failure + DKA).  Considered sepsis -Multifactorial.  Anion gap closed 11/2 with improvement in DKA. -Recheck lactate -  On empiric ceftriaxone and metronidazole.  CT scan of the abdomen was reassuring.  UA negative.  No clear focus of infection.  We will stop the metronidazole, continue ceftriaxone and probably discontinue soon if cultures remain negative -Supportive care and follow BMP, urine output  DKA with known DM type II -Transitioned off insulin infusion early 11/2 -Continue long-acting insulin and sliding scale  Acute encephalopathy -Supportive care for toxic  metabolic contributors.  Now most important likely her hypernatremia -Head CT reassuring  Acute liver injury -large gallstone or tumefactive sludge seen within the gallbladder lumen. No definite gallbladder wall thickening or sonographic Murphy's sign is noted. Portal vein patent -follow LFT, improving -Acetaminophen level never sent, will obtain  Hypernatremia Hypokalemia -consistent w hypovolemic hypernatremia -careful volume administration and follow frequent Na+. Change now to 0.45NS at 125cc/h  -replace potassium   Acute kidney injury -follow UOP and BMP with volume administration -foley in place  Sinus Tachycardia: -supportive care  Frontotemporal dementia -restart home remeron when able to take good PO, and when MS clears  Multiple myeloma -hx stem cell tx 2015 -on observation  Best Practice (right click and "Reselect all SmartList Selections" daily)   Diet/type: NPO DVT prophylaxis: prophylactic heparin  GI prophylaxis: H2B Lines: N/A Foley:  Yes, and it is still needed Code Status:  full code Last date of multidisciplinary goals of care discussion [Husband at bedside]  Labs   CBC: Recent Labs  Lab 07/31/22 0914 07/31/22 1004 08/01/22 0334  WBC 11.7*  --  13.3*  NEUTROABS 9.2*  --   --   HGB 16.8* 16.7* 14.1  HCT 54.1* 49.0* 46.4*  MCV 91.4  --  91.5  PLT 144*  --  99*    Basic Metabolic Panel: Recent Labs  Lab 07/31/22 0914 07/31/22 1004 07/31/22 1615 07/31/22 1914 08/01/22 0137 08/01/22 0334  NA 165* 165* 169* 171* 168* 168*  K 4.2 4.4 3.3* 3.7 4.1 3.5  CL 118*  --  128* >130* >130* 130*  CO2 22  --  _0 GLUCOSE 815*  --  345* 219* 171* 178*  BUN 74*  --  51* 45* 35* 33*  CREATININE 2.47*  --  1.56* 1.52* 1.26* 1.27*  CALCIUM 10.9*  --  10.0 9.6 9.3 9.1  MG  --   --  2.8*  --   --   --   PHOS  --   --  2.4*  --   --   --    GFR: Estimated Creatinine Clearance: 41 mL/min (A) (by C-G formula based on SCr of 1.27 mg/dL  (H)). Recent Labs  Lab 07/31/22 0914 07/31/22 1515 07/31/22 1914 08/01/22 0334  WBC 11.7*  --   --  13.3*  LATICACIDVEN 3.9* 4.8* 4.3*  --     Liver Function Tests: Recent Labs  Lab 07/31/22 0914 07/31/22 1615 08/01/22 0334  AST 1,028* 565* 376*  ALT 1,953* 1,395* 1,093*  ALKPHOS 238* 184* 159*  BILITOT 1.0 0.4 0.4  PROT 9.3* 7.3 6.7  ALBUMIN 3.9 2.9* 2.8*   No results for input(s): "LIPASE", "AMYLASE" in the last 168 hours. No results for input(s): "AMMONIA" in the last 168 hours.  ABG    Component Value Date/Time   HCO3 23.9 07/31/2022 1004   TCO2 25 07/31/2022 1004   ACIDBASEDEF 3.0 (H) 07/31/2022 1004   O2SAT 92 07/31/2022 1004     Coagulation Profile: Recent Labs  Lab 07/31/22 0914 08/01/22 0334  INR 1.4* 1.5*  Cardiac Enzymes: No results for input(s): "CKTOTAL", "CKMB", "CKMBINDEX", "TROPONINI" in the last 168 hours.  HbA1C: Hemoglobin A1C  Date/Time Value Ref Range Status  09/12/2021 07:38 AM 5.7 (A) 4.0 - 5.6 % Final  09/06/2020 07:37 AM 5.6 4.0 - 5.6 % Final   Hgb A1c MFr Bld  Date/Time Value Ref Range Status  04/03/2021 10:05 AM 5.9 4.6 - 6.5 % Final    Comment:    Glycemic Control Guidelines for People with Diabetes:Non Diabetic:  <6%Goal of Therapy: <7%Additional Action Suggested:  >8%   01/10/2020 10:19 AM 5.8 4.6 - 6.5 % Final    Comment:    Glycemic Control Guidelines for People with Diabetes:Non Diabetic:  <6%Goal of Therapy: <7%Additional Action Suggested:  >8%     CBG: Recent Labs  Lab 08/01/22 0052 08/01/22 0138 08/01/22 0243 08/01/22 0348 08/01/22 0458  GLUCAP 133* 156* 151* 172* 168*     Critical care time: 32 minutes      Baltazar Apo, MD, PhD 08/01/2022, 7:18 AM [facility] [phone] or if no answer before 7:00PM call [phone] For any issues after 7:00PM please call eLink [phone]

## 2022-08-01 NOTE — Progress Notes (Signed)
[facility] Progress Note Patient Name: Victoria Gates DOB: May 17, 1951 MRN: [id number]   Date of Service  08/01/2022  HPI/Events of Note  RN reports pt just pulled out her NGT w/mitts on.  Pt is very agitated, yelling out.  Has a 1x order for zyprexa, which has not been given yet, but I asked her to wait to give until you assess.  Per RN, pt needs NGT for FWF - has hypernatremia.  Asking for order to replace NGT once pt more calm.  Last BMP done @ 0820 this a.m, Na 168.  No serial BMPs ordered.  Please order.  RN will draw one now.  Also asking if you will order additional sedation and/or sleep aide.  Pt has h/o dementia but does not have any home meds ordered for it...takes rivastigmine & remeron at home.  Can these be ordered to start tonight?  Patient seen restless and appears confused.  eICU Interventions  May give the one time dose of Zyprexa Feeding tube insertion re-ordered and BMP to be repeated to monitor sodium correction Remeron resumed as risk for seizure with abrupt withdrawal. Will give at a lower dose for now given renal dysfunction Discussed with bedside RN     Intervention Category Intermediate Interventions: Other:  Judd Lien 08/01/2022, 8:06 PM

## 2022-08-01 NOTE — Progress Notes (Signed)
Peripherally Inserted Central Catheter Placement  The IV Nurse has discussed with the patient and/or persons authorized to consent for the patient, the purpose of this procedure and the potential benefits and risks involved with this procedure.  The benefits include less needle sticks, lab draws from the catheter, and the patient may be discharged home with the catheter. Risks include, but not limited to, infection, bleeding, blood clot (thrombus formation), and puncture of an artery; nerve damage and irregular heartbeat and possibility to perform a PICC exchange if needed/ordered by physician.  Alternatives to this procedure were also discussed.  Bard Power PICC patient education guide, fact sheet on infection prevention and patient information card has been provided to patient /or left at bedside.    PICC Placement Documentation  PICC Double Lumen 31/54/00 Right Basilic 38 cm 1 cm (Active)  Indication for Insertion or Continuance of Line Limited venous access - need for IV therapy >5 days (PICC only) 08/01/22 1019  Exposed Catheter (cm) 1 cm 08/01/22 1019  Site Assessment Clean, Dry, Intact 08/01/22 1019  Lumen #1 Status Flushed;Blood return noted;Saline locked 08/01/22 1019  Lumen #2 Status Flushed;Blood return noted;Saline locked 08/01/22 1019  Dressing Type Transparent 08/01/22 1019  Dressing Status Antimicrobial disc in place 08/01/22 1019  Dressing Change Due 08/08/22 08/01/22 1019       Juelz Claar Ramos 08/01/2022, 10:22 AM

## 2022-08-01 NOTE — Progress Notes (Signed)
eLink Physician-Brief Progress Note Patient Name: Victoria Gates DOB: Feb 13, 1951 MRN: [id number]   Date of Service  08/01/2022  HPI/Events of Note  Notified of patient hollering intermittently Seen asleep  eICU Interventions  Ordered Zyprexa to be given prn for agitation     Intervention Category Minor Interventions: Agitation / anxiety - evaluation and management  Judd Lien 08/01/2022, 12:17 AM

## 2022-08-01 NOTE — Progress Notes (Signed)
Initial Nutrition Assessment  DOCUMENTATION CODES:   Non-severe (moderate) malnutrition in context of chronic illness  INTERVENTION:   - Recommend exchanging NG tube for Cortrak tube and bridle  Initiate tube feeds via NG tube: - Start Osmolite 1.2 @ 20 ml/hr and advance by 10 ml q 8 hours to goal rate of 60 ml/hr (1440 ml/day) - Free water flushes per CCM, currently 200 ml q 4 hours  Tube feeding regimen at goal rate provides 1728 kcal, 80 grams of protein, and 1181 ml of H2O.  Total free water with flushes: 2381 ml  Monitor magnesium, potassium, and phosphorus BID for at least 3 days, MD to replete as needed, as pt is at risk for refeeding syndrome given malnutrition, inadequate PO intake PTA.  NUTRITION DIAGNOSIS:   Moderate Malnutrition related to chronic illness (dementia) as evidenced by mild fat depletion, moderate muscle depletion.  GOAL:   Patient will meet greater than or equal to 90% of their needs  MONITOR:   Diet advancement, Labs, Weight trends, TF tolerance, I & O's  REASON FOR ASSESSMENT:   Consult Enteral/tube feeding initiation and management  ASSESSMENT:   71 year old female who presented to the ED on 11/01 with AMS. PMH of T2DM, GERD, multiple myeloma, HLD, iron deficiency anemia, dementia. Pt admitted with metabolic acidosis, AKI, DKA, and acute liver injury.  11/02 - NG tube placed by RN at bedside (tip gastric)  Discussed pt with RN and during ICU rounds. NG tube placed by RN at bedside for free water flushes (200 ml q 4 hours) for hypernatremia. Per CCM MD, okay to start tube feeds as long as electrolytes are closely monitored.  Spoke with pt's husband at bedside. He reports that the majority of pt's nutrition comes from Ensure shakes. Pt drinks the Ensure supplements that are 350 kcal and 20 grams of protein and consumes 3-4 of these daily (1050-1400 kcal, 60-80 grams of protein). Pt will eat food 2-3 times a week which might be mashed potatoes or  rice. Pt drinks sweet tea, water, and occasionally orange juice.  Pt's husband shares that it is very difficult to get pt to eat solid food. This issue first started about a year ago when pt had dental surgery. Pt preferred to consume Ensure supplements rather than eat and has been relying on Ensure as her main source of nutrition for the last year.  Pt's husband reports that pt has gained weight over the last year. He reports a UBW of 170-190 lbs. Reviewed weight history in chart. Noted pt with overall weight gain from October 2022 through August 2023. Pt has experienced a 2.3 kg weight loss since 05/15/22. This is a 3% weight loss in just under 3 months which is not clinically significant for timeframe but is concerning given pt meets criteria for moderate malnutrition.  Per pt's husband, pt has not had any issues with swallowing. Pt with poor dentition and several teeth missing. Pt's husband reports no issues with N/V at baseline and that pt ambulates independently.  RD to start tube feeds a trickle rate and slowly advance to goal. Will monitor electrolytes for refeeding syndrome. Discussed with RN.  Medications reviewed and include: SSI q 4 hours, levemir 12 units, IV thiamine 100 mg, IV abx, IV pepcid, IV KCl 10 mEq x 4 IVF: 1/2NS @ 50 ml/hr  Labs reviewed: sodium 168, chloride >130, BUN 29, creatinine 1.19, phosphorus 2.4 on 11/1, magnesium 2.8 on 11/1, elevated LFTs, WBC 13.3, platelets 99, hemoglobin A1C 9.1 CBG's:  151-204 x 24 hours  UOP: 1640 ml x 24 hours I/O's: +1.5 L since admit  NUTRITION - FOCUSED PHYSICAL EXAM:  Flowsheet Row Most Recent Value  Orbital Region Mild depletion  Upper Arm Region No depletion  Thoracic and Lumbar Region Mild depletion  Buccal Region Mild depletion  Temple Region Mild depletion  Clavicle Bone Region Moderate depletion  Clavicle and Acromion Bone Region Moderate depletion  Scapular Bone Region Moderate depletion  Dorsal Hand Mild depletion   Patellar Region Mild depletion  Anterior Thigh Region Mild depletion  Posterior Calf Region Mild depletion  Edema (RD Assessment) Mild  Hair Reviewed  Eyes Reviewed  Mouth Reviewed  [poor dentition]  Skin Reviewed  Nails Reviewed       Diet Order:   Diet Order             Diet NPO time specified  Diet effective now                   EDUCATION NEEDS:   Education needs have been addressed  Skin:  Skin Assessment: Reviewed RN Assessment  Last BM:  07/31/22  Height:   Ht Readings from Last 1 Encounters:  07/31/22 _0  (1.727 m)    Weight:   Wt Readings from Last 1 Encounters:  08/01/22 75.3 kg    Ideal Body Weight:  63.6 kg  BMI:  Body mass index is 25.24 kg/m.  Estimated Nutritional Needs:   Kcal:  1700-1900  Protein:  80-95 grams  Fluid:  1.7-1.9 L    Gustavus Bryant, MS, RD, LDN Inpatient Clinical Dietitian Please see AMiON for contact information.

## 2022-08-01 NOTE — Inpatient Diabetes Management (Signed)
Inpatient Diabetes Program Recommendations  AACE/ADA: New Consensus Statement on Inpatient Glycemic Control (2015)  Target Ranges:  Prepandial:   less than 140 mg/dL      Peak postprandial:   less than 180 mg/dL (1-2 hours)      Critically ill patients:  140 - 180 mg/dL   Lab Results  Component Value Date   GLUCAP 188 (H) 08/01/2022   HGBA1C 5.7 (A) 09/12/2021    Review of Glycemic Control  Latest Reference Range & Units 08/01/22 03:48 08/01/22 04:58 08/01/22 07:44 08/01/22 09:36  Glucose-Capillary 70 - 99 mg/dL 172 (H) 168 (H) 204 (H) 188 (H)  (H): Data is abnormally high Diabetes history: Type 2 DM Outpatient Diabetes medications: none Current orders for Inpatient glycemic control: Levemir 12 units QD, Novolog 0-15 units Q4H  Inpatient Diabetes Program Recommendations:    Last A1C from 2022, consider repeating?   Following.   Thanks, Bronson Curb, MSN, RNC-OB Diabetes Coordinator [phone] (8a-5p)

## 2022-08-01 NOTE — Progress Notes (Signed)
eLink Physician-Brief Progress Note Patient Name: Victoria Gates DOB: March 23, 1951 MRN: [id number]   Date of Service  08/01/2022  HPI/Events of Note  Na 155 from 168 (13 hours ago), K 3.4 Still agitated with Zyprexa 2.5 mg given 20 minutes ago  eICU Interventions  Will discontinue 0.45% NS for now Unable to reinsert feeding tube for now to give medications and free water Will give another dose of Zyprexa 2.5 IM . Conferred with pharmacy Repeat BMP at 1 am Replace K 10 meqs IV x 2 doses     Intervention Category Minor Interventions: Agitation / anxiety - evaluation and management  Judd Lien 08/01/2022, 9:12 PM

## 2022-08-01 NOTE — Progress Notes (Signed)
eLink Physician-Brief Progress Note Patient Name: Victoria Gates DOB: October 07, 1950 MRN: [id number]   Date of Service  08/01/2022  HPI/Events of Note  Glucose 171 On insulin drip for HHS Creatinine 1.26 improving  eICU Interventions  Ordered to start Levemir at 12 units so as to transition off insulin drip Discussed with BSRN     Intervention Category Intermediate Interventions: Hyperglycemia - evaluation and treatment  Shona Needles Tylan Kinn 08/01/2022, 2:41 AM

## 2022-08-02 ENCOUNTER — Inpatient Hospital Stay (HOSPITAL_COMMUNITY): Payer: BC Managed Care – PPO

## 2022-08-02 DIAGNOSIS — R4182 Altered mental status, unspecified: Secondary | ICD-10-CM | POA: Diagnosis not present

## 2022-08-02 DIAGNOSIS — E872 Acidosis, unspecified: Secondary | ICD-10-CM

## 2022-08-02 DIAGNOSIS — E870 Hyperosmolality and hypernatremia: Secondary | ICD-10-CM | POA: Diagnosis not present

## 2022-08-02 LAB — BASIC METABOLIC PANEL
Anion gap: 5 (ref 5–15)
Anion gap: 8 (ref 5–15)
Anion gap: 4 — ABNORMAL LOW (ref 5–15)
BUN: 20 mg/dL (ref 8–23)
BUN: 18 mg/dL (ref 8–23)
BUN: 17 mg/dL (ref 8–23)
CO2: 27 mmol/L (ref 22–32)
CO2: 25 mmol/L (ref 22–32)
CO2: 24 mmol/L (ref 22–32)
Calcium: 8.5 mg/dL — ABNORMAL LOW (ref 8.9–10.3)
Calcium: 8.3 mg/dL — ABNORMAL LOW (ref 8.9–10.3)
Calcium: 8.2 mg/dL — ABNORMAL LOW (ref 8.9–10.3)
Chloride: 130 mmol/L — ABNORMAL HIGH (ref 98–111)
Chloride: 127 mmol/L — ABNORMAL HIGH (ref 98–111)
Chloride: 121 mmol/L — ABNORMAL HIGH (ref 98–111)
Creatinine, Ser: 1.01 mg/dL — ABNORMAL HIGH (ref 0.44–1.00)
Creatinine, Ser: 1.01 mg/dL — ABNORMAL HIGH (ref 0.44–1.00)
Creatinine, Ser: 0.75 mg/dL (ref 0.44–1.00)
GFR, Estimated: 60 mL/min — ABNORMAL LOW (ref >60)
GFR, Estimated: 60 mL/min — ABNORMAL LOW (ref >60)
GFR, Estimated: >60 mL/min (ref >60)
Glucose, Bld: 112 mg/dL — ABNORMAL HIGH (ref 70–99)
Glucose, Bld: 161 mg/dL — ABNORMAL HIGH (ref 70–99)
Glucose, Bld: 191 mg/dL — ABNORMAL HIGH (ref 70–99)
Potassium: 3.6 mmol/L (ref 3.5–5.1)
Potassium: 3.4 mmol/L — ABNORMAL LOW (ref 3.5–5.1)
Potassium: 5.4 mmol/L — ABNORMAL HIGH (ref 3.5–5.1)
Sodium: 162 mmol/L (ref 135–145)
Sodium: 160 mmol/L — ABNORMAL HIGH (ref 135–145)
Sodium: 149 mmol/L — ABNORMAL HIGH (ref 135–145)

## 2022-08-02 LAB — CBC
HCT: 43.9 % (ref 36.0–46.0)
Hemoglobin: 13.6 g/dL (ref 12.0–15.0)
MCH: 28.2 pg (ref 26.0–34.0)
MCHC: 31 g/dL (ref 30.0–36.0)
MCV: 91.1 fL (ref 80.0–100.0)
Platelets: 76 10*3/uL — ABNORMAL LOW (ref 150–400)
RBC: 4.82 MIL/uL (ref 3.87–5.11)
RDW: 14.1 % (ref 11.5–15.5)
WBC: 9.3 10*3/uL (ref 4.0–10.5)
nRBC: 0 % (ref 0.0–0.2)

## 2022-08-02 LAB — HEPATITIS PANEL, ACUTE
HCV Ab: NONREACTIVE
Hep A IgM: NONREACTIVE
Hep B C IgM: NONREACTIVE
Hepatitis B Surface Ag: NONREACTIVE

## 2022-08-02 LAB — GLUCOSE, CAPILLARY
Glucose-Capillary: 84 mg/dL (ref 70–99)
Glucose-Capillary: 141 mg/dL — ABNORMAL HIGH (ref 70–99)
Glucose-Capillary: 163 mg/dL — ABNORMAL HIGH (ref 70–99)
Glucose-Capillary: 142 mg/dL — ABNORMAL HIGH (ref 70–99)
Glucose-Capillary: 153 mg/dL — ABNORMAL HIGH (ref 70–99)

## 2022-08-02 LAB — LACTIC ACID, PLASMA: Lactic Acid, Venous: 1.2 mmol/L (ref 0.5–1.9)

## 2022-08-02 LAB — PROCALCITONIN: Procalcitonin: 0.3 ng/mL

## 2022-08-02 LAB — HEPATIC FUNCTION PANEL
ALT: 637 U/L — ABNORMAL HIGH (ref 0–44)
AST: 186 U/L — ABNORMAL HIGH (ref 15–41)
Albumin: 2.3 g/dL — ABNORMAL LOW (ref 3.5–5.0)
Alkaline Phosphatase: 117 U/L (ref 38–126)
Bilirubin, Direct: 0.1 mg/dL (ref 0.0–0.2)
Indirect Bilirubin: 0.7 mg/dL (ref 0.3–0.9)
Total Bilirubin: 0.8 mg/dL (ref 0.3–1.2)
Total Protein: 5.8 g/dL — ABNORMAL LOW (ref 6.5–8.1)

## 2022-08-02 LAB — PHOSPHORUS
Phosphorus: 3.3 mg/dL (ref 2.5–4.6)
Phosphorus: 2.9 mg/dL (ref 2.5–4.6)

## 2022-08-02 LAB — AMMONIA: Ammonia: 55 umol/L — ABNORMAL HIGH (ref 9–35)

## 2022-08-02 LAB — ACETAMINOPHEN LEVEL: Acetaminophen (Tylenol), Serum: <10 ug/mL — ABNORMAL LOW (ref 10–30)

## 2022-08-02 LAB — MAGNESIUM
Magnesium: 2 mg/dL (ref 1.7–2.4)
Magnesium: 2.1 mg/dL (ref 1.7–2.4)

## 2022-08-02 MED ORDER — SODIUM POLYSTYRENE SULFONATE 15 GM/60ML PO SUSP
30.0000 g | Freq: Once | ORAL | Status: AC
  Administered 2022-08-02: 30 g
  Filled 2022-08-02: qty 120

## 2022-08-02 MED ORDER — FAMOTIDINE 20 MG PO TABS
20.0000 mg | ORAL_TABLET | Freq: Every day | ORAL | Status: DC
  Administered 2022-08-03 – 2022-08-04 (×2): 20 mg
  Filled 2022-08-02 (×3): qty 1

## 2022-08-02 MED ORDER — STERILE WATER FOR INJECTION IJ SOLN
INTRAMUSCULAR | Status: AC
  Administered 2022-08-02: 2.1 mL
  Filled 2022-08-02: qty 10

## 2022-08-02 MED ORDER — SODIUM CHLORIDE 0.45 % IV SOLN: INTRAVENOUS | Status: DC

## 2022-08-02 MED ORDER — RIVASTIGMINE TARTRATE 1.5 MG PO CAPS
1.5000 mg | ORAL_CAPSULE | Freq: Two times a day (BID) | ORAL | Status: DC
  Administered 2022-08-02 – 2022-08-04 (×4): 1.5 mg
  Filled 2022-08-02 (×7): qty 1

## 2022-08-02 MED ORDER — LANSOPRAZOLE 3 MG/ML SUSP: 15.0000 mg | Freq: Every day | ORAL | Status: DC

## 2022-08-02 MED ORDER — POTASSIUM CHLORIDE 10 MEQ/100ML IV SOLN
10.0000 meq | INTRAVENOUS | Status: AC
  Administered 2022-08-02 (×4): 10 meq via INTRAVENOUS
  Filled 2022-08-02: qty 100

## 2022-08-02 MED ORDER — MIRTAZAPINE 30 MG PO TBDP
30.0000 mg | ORAL_TABLET | Freq: Every day | ORAL | Status: DC
  Administered 2022-08-02: 30 mg
  Filled 2022-08-02 (×3): qty 1

## 2022-08-02 MED ORDER — MORPHINE SULFATE (PF) 2 MG/ML IV SOLN
2.0000 mg | Freq: Once | INTRAVENOUS | Status: AC | PRN
  Administered 2022-08-02: 2 mg via INTRAVENOUS
  Filled 2022-08-02: qty 1

## 2022-08-02 MED ORDER — THIAMINE MONONITRATE 100 MG PO TABS
100.0000 mg | ORAL_TABLET | Freq: Every day | ORAL | Status: DC
  Administered 2022-08-03 – 2022-08-04 (×2): 100 mg
  Filled 2022-08-02 (×2): qty 1

## 2022-08-02 MED ORDER — FREE WATER
250.0000 mL | Status: DC
  Administered 2022-08-02 – 2022-08-03 (×4): 250 mL

## 2022-08-02 NOTE — TOC Progression Note (Signed)
Transition of Care [facility]) - Initial/Assessment Note    Patient Details  Name: Victoria Gates MRN: [id number] Date of Birth: 10-28-1950  Transition of [facility]) CM/SW Contact:    Milinda Antis, LCSWA Phone Number: 08/02/2022, 3:19 PM  Clinical Narrative:                 Transition of Care Department [facility]) has reviewed patient.  Patient is from home and was admitted for DKA and AMS.  Patient currently has [place].  We will continue to monitor patient advancement through interdisciplinary progression rounds.   If new patient transition needs arise, please place a TOC consult.    Patient Goals and CMS Choice        Expected Discharge Plan and Services                                                Prior Living Arrangements/Services                       Activities of Daily Living      Permission Sought/Granted                  Emotional Assessment              Admission diagnosis:  Lactic acidosis [E87.20] Hypernatremia [E87.0] DKA (diabetic ketoacidosis) ([place]) [E11.10] Transaminitis [R74.01] AKI (acute kidney injury) ([place]) [N17.9] Altered mental status, unspecified altered mental status type [R41.82] Hyperosmolar hyperglycemic state (HHS) ([place]) [E11.00] Patient Active Problem List   Diagnosis Date Noted   Hypernatremia 08/02/2022   Altered mental status 08/02/2022   Lactic acidosis 08/02/2022   DKA (diabetic ketoacidosis) ([place]) 07/31/2022   Mild protein-calorie malnutrition ([place]) 01/14/2022   Preventative health care 07/12/2021   Hyperlipidemia associated with type 2 diabetes mellitus ([place]) 07/12/2021   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[place]) 04/03/2021   Pre-op examination 04/03/2021   Hyperlipidemia 07/03/2020   Mild neurocognitive disorder, 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   Bradycardia 04/29/2016   Hypokalemia 11/30/2015   Neutropenia ([place]) 05/03/2015   Syncope 09/11/2014   Bronchitis 09/08/2014    Multiple myeloma ([place]) 10/22/2013   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   Hyperproteinemia 04/14/2013   Midsternal chest pain 04/20/2012   Lipoma of shoulder 11/02/2011   Herpes zoster 11/05/2010   GERD (gastroesophageal reflux disease) 09/21/2010   Cervical Radiculopathy, Left 02/01/2010   Hypercholesterolemia 07/03/2009   Otitis Externa 07/03/2009   Cerumen impaction 07/03/2009   Myalgia 07/03/2009   Allergic rhinitis 01/02/2009   Diet-controlled diabetes mellitus ([place]) 03/18/2008   Chest pain 12/24/2007   URI 12/12/2007   Anemia-Iron Deficiency 04/24/2007   Essential hypertension 04/24/2007   Osteoporosis 04/24/2007   Esophageal Stricture 04/15/2007   Hiatal Hernia 04/15/2007   PCP:  Ann Held, DO Pharmacy:   [id number] [address] [address]Phone: [phone]:: [phone]    Social Determinants of Health (SDOH) Interventions    Readmission Risk Interventions     No data to display

## 2022-08-02 NOTE — Progress Notes (Signed)
Brief Nutrition Note  Discussed pt with RN and during ICU rounds. Pt seen by this RD for Initial Nutrition Assessment on 11/02. Pt with NG tube in place and secured with tape at that time and tube feeds were started. Pt pulled NG tube out overnight and RN unable to replace. Cortrak team placed Cortrak today. Abdominal x-ray obtained showing distal tip of feeding tube in distal stomach.  Resume tube feeds via Cortrak: - Start Osmolite 1.2 @ 40 ml/hr and advance by 10 ml q 8 hours to goal rate of 60 ml/hr (1440 ml/day) - Free water flushes per CCM, currently 200 ml q 4 hours   Tube feeding regimen at goal rate provides 1728 kcal, 80 grams of protein, and 1181 ml of H2O.   Total free water with flushes: 2381 ml   Monitor magnesium, potassium, and phosphorus BID for at least 3 days, MD to replete as needed, as pt is at risk for refeeding syndrome given malnutrition, inadequate PO intake PTA.  RD will continue to follow pt during admission.   Gustavus Bryant, MS, RD, LDN Inpatient Clinical Dietitian Please see AMiON for contact information.

## 2022-08-02 NOTE — Progress Notes (Signed)
[place] Progress Note Patient Name: Victoria Gates DOB: 05-Aug-1951 MRN: [id number]   Date of Service  08/02/2022  HPI/Events of Note  Na 162, K 3.6, Cr 1.01 Seen asleep  eICU Interventions  Resume 0.45 at 50/hr Repeat electrolytes in am     Intervention Category Intermediate Interventions: Electrolyte abnormality - evaluation and management  Judd Lien 08/02/2022, 2:35 AM

## 2022-08-02 NOTE — Progress Notes (Signed)
[place] Progress Note Patient Name: Victoria Gates DOB: [birth year] MRN: [id number]   Date of Service  08/02/2022  HPI/Events of Note  Glucose 84 Tube feeding on hold Has due Levemir  eICU Interventions  Discussed with bedside RN may hold Levemir until tube feeding can be started     Intervention Category Intermediate Interventions: Other:  Judd Lien 08/02/2022, 4:43 AM

## 2022-08-02 NOTE — Procedures (Signed)
Cortrak  Person Inserting Tube:  Victoria Gates D, RD Tube Type:  Cortrak - 43 inches Tube Size:  10 Tube Location:  Left nare Secured by: Bridle Technique Used to Measure Tube Placement:  Marking at nare/corner of mouth Cortrak Secured At:  58 cm  Cortrak Tube Team Note:  Consult received to place a Cortrak feeding tube.   X-ray is required, abdominal x-ray has been ordered by the Cortrak team. Please confirm tube placement before using the Cortrak tube.   If the tube becomes dislodged please keep the tube and contact the Cortrak team at [email or web address] for replacement.  If after hours and replacement cannot be delayed, place a NG tube and confirm placement with an abdominal x-ray.    Victoria Gates, RD, LDN Clinical Dietitian RD pager # available in [place]  After hours/weekend pager # available in [facility]

## 2022-08-02 NOTE — Plan of Care (Signed)
  Problem: Respiratory: Goal: Will regain and/or maintain adequate ventilation Outcome: Progressing   Problem: Elimination: Goal: Will not experience complications related to bowel motility Outcome: Progressing   Problem: Skin Integrity: Goal: Risk for impaired skin integrity will decrease Outcome: Progressing

## 2022-08-02 NOTE — Progress Notes (Addendum)
NAME:  Victoria Gates, MRN:  [id number], DOB:  1951-03-27, LOS: 2 ADMISSION DATE:  07/31/2022, CONSULTATION DATE:  07/31/2022 REFERRING MD:  Maylon Peppers, CHIEF COMPLAINT:  DKA   History of Present Illness:  71 yo female with known DM, type II, GERD, multiple myeloma, hyperlipidemia, iron deficiency anemia admitted with metabolic acidosis, AKI, and acute liver injury.  Patient presented to ED via EMS with complaints of weakness x 3 days associated with encephalopathy.  SBP in the 90's and she received 600 ml of LR via EMS.  On arrival to ED, VS: Temp: 98.2, HR: 119, RR: 32, BP: 107/87 with SPO2 93% on room air. She received an additional 1 L of LR in the ED.  Labs obtained and initial BG > 600 with ph of 7.299 on VBG.    Husband at bedside.  He states Monday he went for a colonoscopy, when he dropped off his wife she was in her usual state of health.  Upon picking her up after the colonoscopy she was more altered than normal. Over the last 2 days she has continued to deteriorate to this morning when he was unable to get her up.    Pertinent  Medical History   Past Medical History:  Diagnosis Date   Allergic rhinitis 01/02/2009   Bradycardia 04/29/2016   Cervical Radiculopathy 02/01/2010   Left side   Chest pain 04/15/2012   Ex MV: Ex time 10 mins, EF 63%, no ischemia/infarct. Occas PAC's/PVC's.   Diabetes mellitus, type II 03/18/2008   Encounter for antineoplastic chemotherapy 04/29/2016   Esophageal Stricture 04/15/2007   s/p dilitation   GERD (gastroesophageal reflux disease) 09/21/2010   Herpes zoster 11/05/2010   Hiatal Hernia 04/15/2007   History of colonic polyps 04/24/2007   Hyperplastic only   Hypercholesterolemia 07/03/2009   Hypokalemia 11/30/2015   Iron deficiency anemia 04/24/2007   Multiple myeloma ([place]) 02/2014   Osteoporosis 04/24/2007     Significant Hospital Events: Including procedures, antibiotic start and stop dates in addition to other pertinent events   Admit to  hospital with DKA CT abdomen pelvis 11/1 >> no intra-abdominal process, multiple scattered lucent lesions in the visualized spine consistent with her multiple myeloma  Interim History / Subjective:  Agitation overnight. Requiring 2.5 mg zyprexa IM and 2 mg morphine IV.   Objective   Blood pressure 113/71, pulse 96, temperature 98.1 F (36.7 C), temperature source Oral, resp. rate (!) 9, height _0  (1.727 m), weight 78.1 kg, SpO2 99 %.        Intake/Output Summary (Last 24 hours) at 08/02/2022 0734 Last data filed at 08/02/2022 0600 Gross per 24 hour  Intake 2311.26 ml  Output 1455 ml  Net 856.26 ml   Filed Weights   07/31/22 0905 08/01/22 0501 08/02/22 0500  Weight: 77.1 kg 75.3 kg 78.1 kg    Examination: General: older adult female, lying in bed, no distress  HENT: Dry MM  Lungs: clear breath sounds, no use of accessory muscles  Cardiovascular: RRR, HR 93, no mRG Abdomen: Soft, tender, non distended Extremities: -edema  Neuro: alert, follows simple commands, mumbles, unclear speech, disoriented to place, time, situation   Resolved Hospital Problem list     Assessment & Plan:   Acute encephalopathy, suspect in setting of severe metabolic derangements  -Head CT no acute  Plan  - Plan as below  - Obtain ammonia  - Consider MRI if no improvement after metabolic state normalized   Anion gap acidosis (lactic acidosis + renal  failure + DKA).  Considered sepsis > improved  - Lactic Acid 1.2 - Flagyl stopped 11/2  - CT A/P  no acute process, multiple scattered lucent lesions visualized spine and pelvis  - [place] ABD large gallstone or tumefactive sludge seen within gallbladder lumen, no definite wall thickening or murphy's sign  - Urine Culture negative  Plan -D/C  -Follow culture results from 11/1  -WBC up-trending 11/2, today results pending. Remains afebrile  -Supportive care and follow BMP, urine output  DKA with known DM type II -Transitioned off insulin infusion  early 11/2 Plan -Continue long-acting insulin and sliding scale  Acute liver injury -large gallstone or tumefactive sludge seen within the gallbladder lumen. No definite gallbladder wall thickening or sonographic Murphy's sign is noted. 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  Multiple myeloma -hx stem cell tx 2015 -on observation  Best Practice (right click and "Reselect all SmartList Selections" daily)   Diet/type: NPO DVT prophylaxis: prophylactic heparin  GI prophylaxis: H2B Lines: N/A Foley:  Yes, and it is still needed Code Status:  full code Last date of multidisciplinary goals of care discussion [Husband at bedside]  Labs   CBC: Recent Labs  Lab 07/31/22 0914 07/31/22 1004 08/01/22 0334  WBC 11.7*  --  13.3*  NEUTROABS 9.2*  --   --   HGB 16.8* 16.7* 14.1  HCT 54.1* 49.0* 46.4*  MCV 91.4  --  91.5  PLT 144*  --  99*    Basic Metabolic Panel: Recent Labs  Lab 07/31/22 1615 07/31/22 1914 08/01/22 0334 08/01/22 0820 08/01/22 1958 08/02/22 0058 08/02/22 0538  NA 169*   < > 168* 168* 155* 162* 160*  K 3.3*   < > 3.5 4.2 3.4* 3.6 3.4*  CL 128*   < > 130* >130* 124* 130* 127*  CO2 24   < > _0 GLUCOSE 345*   < > 178* 236* 330* 112* 161*  BUN 51*   < > 33* 29* _1 CREATININE 1.56*   < > 1.27* 1.19* 1.10* 1.01* 1.01*  CALCIUM 10.0   < > 9.1 9.1 8.3* 8.5* 8.3*  MG 2.8*  --   --  2.4  --   --  2.0  PHOS 2.4*  --   --  2.6  --   --  3.3   < > = values in this interval not displayed.   GFR: Estimated Creatinine Clearance: 56.1 mL/min (A) (by C-G formula based  on SCr of 1.01 mg/dL (H)). Recent Labs  Lab 07/31/22 0914 07/31/22 1515 07/31/22 1914 08/01/22 0334 08/01/22 1049 08/02/22 0538  WBC 11.7*  --   --  13.3*  --   --   LATICACIDVEN 3.9* 4.8* 4.3*  --  1.3 1.2    Liver Function Tests: Recent Labs  Lab 07/31/22 0914 07/31/22 1615 08/01/22 0334 08/02/22 0538  AST 1,028* 565* 376* 186*  ALT 1,953* 1,395* 1,093* 637*  ALKPHOS 238* 184* 159* 117  BILITOT 1.0 0.4 0.4 0.8  PROT 9.3* 7.3 6.7 5.8*  ALBUMIN 3.9 2.9* 2.8* 2.3*   No results for input(s): "LIPASE", "AMYLASE" in the last 168 hours. No  results for input(s): "AMMONIA" in the last 168 hours.  ABG    Component Value Date/Time   HCO3 23.9 07/31/2022 1004   TCO2 25 07/31/2022 1004   ACIDBASEDEF 3.0 (H) 07/31/2022 1004   O2SAT 92 07/31/2022 1004     Coagulation Profile: Recent Labs  Lab 07/31/22 0914 08/01/22 0334  INR 1.4* 1.5*    Cardiac Enzymes: No results for input(s): "CKTOTAL", "CKMB", "CKMBINDEX", "TROPONINI" in the last 168 hours.  HbA1C: Hemoglobin A1C  Date/Time Value Ref Range Status  09/12/2021 07:38 AM 5.7 (A) 4.0 - 5.6 % Final   Hgb A1c MFr Bld  Date/Time Value Ref Range Status  08/01/2022 03:34 AM 9.1 (H) 4.8 - 5.6 % Final    Comment:    (NOTE) Pre diabetes:          5.7%-6.4%  Diabetes:              >6.4%  Glycemic control for   <7.0% adults with diabetes   04/03/2021 10:05 AM 5.9 4.6 - 6.5 % Final    Comment:    Glycemic Control Guidelines for People with Diabetes:Non Diabetic:  <6%Goal of Therapy: <7%Additional Action Suggested:  >8%     CBG: Recent Labs  Lab 08/01/22 1631 08/01/22 1911 08/01/22 2324 08/02/22 0358 08/02/22 0726  GLUCAP 162* 273* 125* 84 141*     Critical care time: 35 minutes     CRITICAL CARE Performed by: Omar Person   Total critical care time: 35 minutes  Critical care time was exclusive of separately billable procedures and treating other patients.  Critical care was necessary to  treat or prevent imminent or life-threatening deterioration.  Critical care was time spent personally by me on the following activities: development of treatment plan with patient and/or surrogate as well as nursing, discussions with consultants, evaluation of patient's response to treatment, examination of patient, obtaining history from patient or surrogate, ordering and performing treatments and interventions, ordering and review of laboratory studies, ordering and review of radiographic studies, pulse oximetry and re-evaluation of patient's condition.  Hayden Pedro, AGACNP-BC [facility]  PCCM Pgr: [phone]

## 2022-08-02 NOTE — Progress Notes (Signed)
[place] Progress Note Patient Name: Victoria Gates DOB: 24-Jul-1951 MRN: [id number]   Date of Service  08/02/2022  HPI/Events of Note  Still with episodes of restlessness. Bedside RN suggesting this could be pain related as patient unable to verbalize symptoms.  eICU Interventions  Ordered a trial of one time dose of morphine     Intervention Category Intermediate Interventions: Pain - evaluation and management Minor Interventions: Agitation / anxiety - evaluation and management  Judd Lien 08/02/2022, 1:09 AM

## 2022-08-03 DIAGNOSIS — N179 Acute kidney failure, unspecified: Secondary | ICD-10-CM | POA: Insufficient documentation

## 2022-08-03 DIAGNOSIS — E1110 Type 2 diabetes mellitus with ketoacidosis without coma: Secondary | ICD-10-CM

## 2022-08-03 DIAGNOSIS — R7401 Elevation of levels of liver transaminase levels: Secondary | ICD-10-CM | POA: Insufficient documentation

## 2022-08-03 DIAGNOSIS — R4182 Altered mental status, unspecified: Secondary | ICD-10-CM | POA: Diagnosis not present

## 2022-08-03 DIAGNOSIS — E440 Moderate protein-calorie malnutrition: Secondary | ICD-10-CM | POA: Insufficient documentation

## 2022-08-03 DIAGNOSIS — G9341 Metabolic encephalopathy: Secondary | ICD-10-CM | POA: Diagnosis not present

## 2022-08-03 LAB — BASIC METABOLIC PANEL
Anion gap: 4 — ABNORMAL LOW (ref 5–15)
Anion gap: 10 (ref 5–15)
BUN: 15 mg/dL (ref 8–23)
BUN: 15 mg/dL (ref 8–23)
CO2: 26 mmol/L (ref 22–32)
CO2: 23 mmol/L (ref 22–32)
Calcium: 8.8 mg/dL — ABNORMAL LOW (ref 8.9–10.3)
Calcium: 8.7 mg/dL — ABNORMAL LOW (ref 8.9–10.3)
Chloride: 121 mmol/L — ABNORMAL HIGH (ref 98–111)
Chloride: 113 mmol/L — ABNORMAL HIGH (ref 98–111)
Creatinine, Ser: 0.82 mg/dL (ref 0.44–1.00)
Creatinine, Ser: 0.8 mg/dL (ref 0.44–1.00)
GFR, Estimated: >60 mL/min (ref >60)
GFR, Estimated: >60 mL/min (ref >60)
Glucose, Bld: 239 mg/dL — ABNORMAL HIGH (ref 70–99)
Glucose, Bld: 252 mg/dL — ABNORMAL HIGH (ref 70–99)
Potassium: 3.8 mmol/L (ref 3.5–5.1)
Potassium: 3.7 mmol/L (ref 3.5–5.1)
Sodium: 151 mmol/L — ABNORMAL HIGH (ref 135–145)
Sodium: 146 mmol/L — ABNORMAL HIGH (ref 135–145)

## 2022-08-03 LAB — PHOSPHORUS: Phosphorus: 3.1 mg/dL (ref 2.5–4.6)

## 2022-08-03 LAB — GLUCOSE, CAPILLARY
Glucose-Capillary: 207 mg/dL — ABNORMAL HIGH (ref 70–99)
Glucose-Capillary: 207 mg/dL — ABNORMAL HIGH (ref 70–99)
Glucose-Capillary: 234 mg/dL — ABNORMAL HIGH (ref 70–99)
Glucose-Capillary: 246 mg/dL — ABNORMAL HIGH (ref 70–99)
Glucose-Capillary: 254 mg/dL — ABNORMAL HIGH (ref 70–99)
Glucose-Capillary: 262 mg/dL — ABNORMAL HIGH (ref 70–99)

## 2022-08-03 LAB — CBC
HCT: 37.3 % (ref 36.0–46.0)
Hemoglobin: 11.7 g/dL — ABNORMAL LOW (ref 12.0–15.0)
MCH: 27.9 pg (ref 26.0–34.0)
MCHC: 31.4 g/dL (ref 30.0–36.0)
MCV: 88.8 fL (ref 80.0–100.0)
Platelets: 66 10*3/uL — ABNORMAL LOW (ref 150–400)
RBC: 4.2 MIL/uL (ref 3.87–5.11)
RDW: 13.6 % (ref 11.5–15.5)
WBC: 7.9 10*3/uL (ref 4.0–10.5)
nRBC: 0 % (ref 0.0–0.2)

## 2022-08-03 LAB — MAGNESIUM: Magnesium: 2.2 mg/dL (ref 1.7–2.4)

## 2022-08-03 LAB — HEPATIC FUNCTION PANEL
ALT: 449 U/L — ABNORMAL HIGH (ref 0–44)
AST: 116 U/L — ABNORMAL HIGH (ref 15–41)
Albumin: 2.3 g/dL — ABNORMAL LOW (ref 3.5–5.0)
Alkaline Phosphatase: 130 U/L — ABNORMAL HIGH (ref 38–126)
Bilirubin, Direct: 0.1 mg/dL (ref 0.0–0.2)
Indirect Bilirubin: 0.3 mg/dL (ref 0.3–0.9)
Total Bilirubin: 0.4 mg/dL (ref 0.3–1.2)
Total Protein: 5.7 g/dL — ABNORMAL LOW (ref 6.5–8.1)

## 2022-08-03 LAB — PROCALCITONIN: Procalcitonin: 0.81 ng/mL

## 2022-08-03 MED ORDER — MIRTAZAPINE 15 MG PO TBDP
15.0000 mg | ORAL_TABLET | Freq: Every day | ORAL | Status: DC
  Administered 2022-08-03: 15 mg
  Filled 2022-08-03 (×2): qty 1

## 2022-08-03 MED ORDER — ADULT MULTIVITAMIN W/MINERALS CH
1.0000 | ORAL_TABLET | Freq: Every day | ORAL | Status: DC
  Administered 2022-08-03 – 2022-08-04 (×2): 1 via ORAL
  Filled 2022-08-03 (×2): qty 1

## 2022-08-03 MED ORDER — ORAL CARE MOUTH RINSE: 15.0000 mL | OROMUCOSAL | Status: DC | PRN

## 2022-08-03 MED ORDER — THIAMINE HCL 100 MG/ML IJ SOLN: 100.0000 mg | Freq: Every day | INTRAMUSCULAR | Status: DC

## 2022-08-03 MED ORDER — DEXTROSE 5 % AND 0.45 % NACL IV BOLUS
500.0000 mL | Freq: Once | INTRAVENOUS | Status: AC
  Administered 2022-08-03: 500 mL via INTRAVENOUS

## 2022-08-03 MED ORDER — LORAZEPAM 1 MG PO TABS: 1.0000 mg | ORAL_TABLET | ORAL | Status: DC | PRN

## 2022-08-03 MED ORDER — HALOPERIDOL LACTATE 5 MG/ML IJ SOLN
2.0000 mg | Freq: Four times a day (QID) | INTRAMUSCULAR | Status: DC | PRN
  Filled 2022-08-03: qty 0.4

## 2022-08-03 MED ORDER — LORAZEPAM 2 MG/ML IJ SOLN: 1.0000 mg | INTRAMUSCULAR | Status: DC | PRN

## 2022-08-03 MED ORDER — MELATONIN 5 MG PO TABS
5.0000 mg | ORAL_TABLET | Freq: Every day | ORAL | Status: DC
  Administered 2022-08-03: 5 mg
  Filled 2022-08-03: qty 1

## 2022-08-03 MED ORDER — LORAZEPAM 2 MG/ML IJ SOLN
1.0000 mg | INTRAMUSCULAR | Status: AC
  Administered 2022-08-03: 1 mg via INTRAVENOUS
  Filled 2022-08-03: qty 1

## 2022-08-03 MED ORDER — INSULIN DETEMIR 100 UNIT/ML ~~LOC~~ SOLN
15.0000 [IU] | Freq: Every day | SUBCUTANEOUS | Status: DC
  Administered 2022-08-04: 15 [IU] via SUBCUTANEOUS
  Filled 2022-08-03: qty 0.15

## 2022-08-03 MED ORDER — ORAL CARE MOUTH RINSE
15.0000 mL | OROMUCOSAL | Status: DC
  Administered 2022-08-03 – 2022-08-04 (×6): 15 mL via OROMUCOSAL

## 2022-08-03 MED ORDER — INSULIN ASPART 100 UNIT/ML IJ SOLN: 5.0000 [IU] | INTRAMUSCULAR | Status: DC

## 2022-08-03 MED ORDER — FREE WATER
200.0000 mL | Status: DC
  Administered 2022-08-03 – 2022-08-04 (×15): 200 mL

## 2022-08-03 MED ORDER — MELATONIN 5 MG PO TABS: 5.0000 mg | ORAL_TABLET | Freq: Every day | ORAL | Status: DC

## 2022-08-03 MED ORDER — THIAMINE MONONITRATE 100 MG PO TABS: 100.0000 mg | ORAL_TABLET | Freq: Every day | ORAL | Status: DC

## 2022-08-03 MED ORDER — FOLIC ACID 1 MG PO TABS
1.0000 mg | ORAL_TABLET | Freq: Every day | ORAL | Status: DC
  Administered 2022-08-03: 1 mg via ORAL
  Filled 2022-08-03: qty 1

## 2022-08-03 NOTE — Progress Notes (Signed)
On call doctor informed that pt noted to be agitated h/o dementia and encepholapathy has haldol ordered but recent EKG 10/10/21 shows prolonged Qt and this medication can further cause that. Requested to please consider ativan iv or something to relax her. Pending new orders.

## 2022-08-03 NOTE — Progress Notes (Signed)
PROGRESS NOTE    [name]  GYF:[id number] DOB: January 21, 1951 DOA: 07/31/2022 PCP: Ann Held, DO     Brief Narrative:  Victoria Gates is a 71 yo female with known DM type II, GERD, multiple myeloma in remission, hyperlipidemia, iron deficiency anemia, frontotemporal dementia, who was admitted with DKA, metabolic acidosis, AKI, and acute liver injury on 07/31/22 to ICU.  She initially presented to the emergency department with chief complaint of weakness over the past several days associated with encephalopathy.  On presentation, labs were consistent with DKA.  She was started on insulin drip.  Due to hypernatremia and altered mental status, cortrak was placed for nutrition.  DKA resolved and patient was transferred to [facility] service 11/4.  New events last 24 hours / Subjective: Patient with sundowning overnight.  Ativan was started overnight for concern for alcohol withdrawal.  Husband is at bedside who denies that patient takes any alcohol at home.  He states that patient has caregiver at home while he works.  Patient has sundowning at home nightly and is not new.  Assessment & Plan:   Principal Problem:   DKA (diabetic ketoacidosis) ([place]) Active Problems:   Frontotemporal dementia ([place])   Hypernatremia   Altered mental status   Lactic acidosis   Malnutrition of moderate degree   AKI (acute kidney injury) ([place])   Acute metabolic encephalopathy   Transaminitis   DKA -Transition off insulin drip -A1c 9.1 -Meter, NovoLog sliding scale insulin  Hypernatremia -Increase free water flushes  Acute metabolic encephalopathy in setting of frontotemporal dementia -CT head without evidence of acute intracranial abnormality -Delirium precaution -Telemetry sitter -Remeron, Exelon -On cortrak for tube feeding.  SLP evaluation to increase oral intake  Acute transaminitis -Right upper quadrant ultrasound showed large gallstone, sludge without gallbladder wall  thickening -Hepatitis panel negative -Acetaminophen level negative -Improved  AKI -Resolved  Sepsis ruled out -Cultures negative, urine culture negative -No source of infection found  History of multiple myeloma -Currently in remission   DVT prophylaxis:  heparin injection 5,000 Units Start: 07/31/22 1400 SCDs Start: 07/31/22 1220  Code Status: Full Family Communication: Husband at bedside. Discussed palliative care consult, husband deferred for now  Disposition Plan:  Status is: Inpatient Remains inpatient appropriate because: Requiring tube feeding    Antimicrobials:  Anti-infectives (From admission, onward)    Start     Dose/Rate Route Frequency Ordered Stop   07/31/22 1300  metroNIDAZOLE (FLAGYL) IVPB 500 mg  Status:  Discontinued        500 mg 100 mL/hr over 60 Minutes Intravenous Every 12 hours 07/31/22 1214 08/01/22 0846   07/31/22 1230  cefTRIAXone (ROCEPHIN) 2 g in sodium chloride 0.9 % 100 mL IVPB  Status:  Discontinued        2 g 200 mL/hr over 30 Minutes Intravenous Every 24 hours 07/31/22 1214 08/02/22 1036        Objective: Vitals:   08/03/22 0500 08/03/22 0556 08/03/22 0742 08/03/22 0744  BP:  107/75  103/63  Pulse:  92 91 85  Resp:    17  Temp:  98.1 F (36.7 C)  98.4 F (36.9 C)  TempSrc:  Oral  Oral  SpO2:  100% 100% 100%  Weight: 77.2 kg     Height:        Intake/Output Summary (Last 24 hours) at 08/03/2022 1243 Last data filed at 08/02/2022 1730 Gross per 24 hour  Intake 966.94 ml  Output 356 ml  Net 610.94 ml  Filed Weights   08/01/22 0501 08/02/22 0500 08/03/22 0500  Weight: 75.3 kg 78.1 kg 77.2 kg    Examination:  General exam: Appears calm and comfortable, somnolent  Respiratory system: Clear to auscultation. Respiratory effort normal. No respiratory distress.  Cardiovascular system: S1 & S2 heard, RRR. No murmurs. No pedal edema. Gastrointestinal system: Abdomen is nondistended, soft and nontender.  Extremities:  Symmetric in appearance  Skin: No rashes, lesions or ulcers on exposed skin   Data Reviewed: I have personally reviewed following labs and imaging studies  CBC: Recent Labs  Lab 07/31/22 0914 07/31/22 1004 08/01/22 0334 08/02/22 0958 08/03/22 0310  WBC 11.7*  --  13.3* 9.3 7.9  NEUTROABS 9.2*  --   --   --   --   HGB 16.8* 16.7* 14.1 13.6 11.7*  HCT 54.1* 49.0* 46.4* 43.9 37.3  MCV 91.4  --  91.5 91.1 88.8  PLT 144*  --  99* 76* 66*   Basic Metabolic Panel: Recent Labs  Lab 07/31/22 1615 07/31/22 1914 08/01/22 0820 08/01/22 1958 08/02/22 0058 08/02/22 0538 08/02/22 1735 08/03/22 0310  NA 169*   < > 168* 155* 162* 160* 149* 151*  K 3.3*   < > 4.2 3.4* 3.6 3.4* 5.4* 3.8  CL 128*   < > >130* 124* 130* 127* 121* 121*  CO2 24   < > _0 GLUCOSE 345*   < > 236* 330* 112* 161* 191* 239*  BUN 51*   < > 29* _1 CREATININE 1.56*   < > 1.19* 1.10* 1.01* 1.01* 0.75 0.82  CALCIUM 10.0   < > 9.1 8.3* 8.5* 8.3* 8.2* 8.8*  MG 2.8*  --  2.4  --   --  2.0 2.1 2.2  PHOS 2.4*  --  2.6  --   --  3.3 2.9 3.1   < > = values in this interval not displayed.   GFR: Estimated Creatinine Clearance: 68.7 mL/min (by C-G formula based on SCr of 0.82 mg/dL). Liver Function Tests: Recent Labs  Lab 07/31/22 0914 07/31/22 1615 08/01/22 0334 08/02/22 0538 08/03/22 0310  AST 1,028* 565* 376* 186* 116*  ALT 1,953* 1,395* 1,093* 637* 449*  ALKPHOS 238* 184* 159* 117 130*  BILITOT 1.0 0.4 0.4 0.8 0.4  PROT 9.3* 7.3 6.7 5.8* 5.7*  ALBUMIN 3.9 2.9* 2.8* 2.3* 2.3*   No results for input(s): "LIPASE", "AMYLASE" in the last 168 hours. Recent Labs  Lab 08/02/22 0958  AMMONIA 55*   Coagulation Profile: Recent Labs  Lab 07/31/22 0914 08/01/22 0334  INR 1.4* 1.5*   Cardiac Enzymes: No results for input(s): "CKTOTAL", "CKMB", "CKMBINDEX", "TROPONINI" in the last 168 hours. BNP (last 3 results) No results for input(s): "PROBNP" in the last 8760  hours. HbA1C: Recent Labs    08/01/22 0334  HGBA1C 9.1*   CBG: Recent Labs  Lab 08/02/22 2054 08/03/22 0035 08/03/22 0448 08/03/22 0739 08/03/22 1219  GLUCAP 153* 246* 207* 262* 254*   Lipid Profile: No results for input(s): "CHOL", "HDL", "LDLCALC", "TRIG", "CHOLHDL", "LDLDIRECT" in the last 72 hours. Thyroid Function Tests: No results for input(s): "TSH", "T4TOTAL", "FREET4", "T3FREE", "THYROIDAB" in the last 72 hours. Anemia Panel: No results for input(s): "VITAMINB12", "FOLATE", "FERRITIN", "TIBC", "IRON", "RETICCTPCT" in the last 72 hours. Sepsis Labs: Recent Labs  Lab 07/31/22 1515 07/31/22 1914 08/01/22 1049 08/02/22 0538 08/02/22 0958 08/03/22 0310  PROCALCITON  --   --   --   --  0.30 0.81  LATICACIDVEN 4.8* 4.3* 1.3 1.2  --   --     Recent Results (from the past 240 hour(s))  Blood Culture (routine x 2)     Status: None (Preliminary result)   Collection Time: 07/31/22  8:49 AM   Specimen: BLOOD  Result Value Ref Range Status   Specimen Description BLOOD LEFT ANTECUBITAL  Final   Special Requests   Final    BOTTLES DRAWN AEROBIC AND ANAEROBIC Blood Culture adequate volume   Culture   Final    NO GROWTH 3 DAYS Performed at [facility], [address], [place], [place] [address]    Report Status PENDING  Incomplete  Urine Culture     Status: None   Collection Time: 07/31/22  8:49 AM   Specimen: In/Out Cath Urine  Result Value Ref Range Status   Specimen Description IN/OUT CATH URINE  Final   Special Requests NONE  Final   Culture   Final    NO GROWTH Performed at [facility], [place] [address], [place], [place] [address]    Report Status 08/01/2022 FINAL  Final      Radiology Studies: DG Abd Portable 1V  Result Date: 08/02/2022 CLINICAL DATA:  Feeding tube placement. EXAM: PORTABLE ABDOMEN - 1 VIEW COMPARISON:  August 01, 2022. FINDINGS: Distal tip of feeding tube is seen in expected position of distal stomach. IMPRESSION: Distal  tip of feeding tube seen in expected position of distal stomach. Electronically Signed   By: Marijo Conception M.D.   On: 08/02/2022 12:40   DG Abd Portable 1V  Result Date: 08/01/2022 CLINICAL DATA:  NG tube placement EXAM: PORTABLE ABDOMEN - 1 VIEW COMPARISON:  CT done on 07/31/2022 FINDINGS: Tip of NG tube is seen in the region of body of stomach. Bowel gas pattern is nonspecific. No abnormal masses or calcifications are seen. Lower pelvis is not included in the image. IMPRESSION: Tip of NG tube is seen in the stomach. Electronically Signed   By: Elmer Picker M.D.   On: 08/01/2022 12:55      Scheduled Meds:  Chlorhexidine Gluconate Cloth  6 each Topical Daily   famotidine  20 mg Per Tube Daily   free water  200 mL Per Tube Q2H   heparin  5,000 Units Subcutaneous Q8H   insulin aspart  0-15 Units Subcutaneous Q4H   [START ON 08/04/2022] insulin detemir  15 Units Subcutaneous Daily   mirtazapine  30 mg Per Tube QHS   multivitamin with minerals  1 tablet Oral Daily   mouth rinse  15 mL Mouth Rinse 4 times per day   rivastigmine  1.5 mg Per Tube BID   sodium chloride flush  10-40 mL Intracatheter Q12H   thiamine  100 mg Per Tube Daily   Continuous Infusions:  dextrose 5 % and 0.45% NaCl     feeding supplement (OSMOLITE 1.2 CAL) 60 mL/hr at 08/02/22 1700     LOS: 3 days     Dessa Phi, DO [facility] 08/03/2022, 12:43 PM   Available via Epic secure chat 7am-7pm After these hours, please refer to coverage provider listed on [email or web address]

## 2022-08-03 NOTE — Progress Notes (Signed)
Received a call from bedside RN regarding the patient being restless with agitation.  The patient states she drinks beer at home.  Concern for possible alcohol withdrawal.  Placed on CIWA protocol.

## 2022-08-03 NOTE — Progress Notes (Signed)
Received a call from bedside RN regarding the patient being agitated with delirium.  Reviewed the patient's chart.  Per husband, no history of alcohol use.  Avoid benzodiazepines, as it can exacerbate her delirium.   Last 12 lead EKG with QTC 517.  Will repeat 12 lead EKG.  Avoiding QTC prolonging agents at this time.  Optimize magnesium and potassium levels.    Added Melatonin 5 mg qhs per tube to aid with situational insomnia.  Presented at bedside.  Mittens and soft restraints are in place for the patient's own safety.  Lungs are clear to auscultation.  Heart with RRR no rubs or gallops.  No peripheral edema.  We will continue to closely monitor and treat as indicated.  Time spent: 35 minutes.

## 2022-08-03 NOTE — Progress Notes (Signed)
SLP Cancellation Note  Patient Details Name: Victoria Gates MRN: [id number] DOB: 1950-10-09   Cancelled treatment:       Reason Eval/Treat Not Completed: Patient's level of consciousness- Have attempted to see pt x2 but not rousable.  Has cortrak for nutrition. Will continue efforts as schedule allows.  [name] L. Tivis Ringer, MA CCC/SLP Clinical Specialist - Acute Care SLP Acute Rehabilitation Services Office number [phone]    [name] 08/03/2022, 10:13 AM

## 2022-08-03 NOTE — Progress Notes (Signed)
Patient continues to be agitated and hollering all shift. Patient has a cortrak and is constantly  attempting to pill it out. Reached out to on call provider, And obtained PRN orders for Ativan and flexiseal orders d/t to constant runny stools. See mar for details.

## 2022-08-04 DIAGNOSIS — E1110 Type 2 diabetes mellitus with ketoacidosis without coma: Secondary | ICD-10-CM | POA: Diagnosis not present

## 2022-08-04 LAB — HEPATIC FUNCTION PANEL
ALT: 314 U/L — ABNORMAL HIGH (ref 0–44)
AST: 119 U/L — ABNORMAL HIGH (ref 15–41)
Albumin: 2.1 g/dL — ABNORMAL LOW (ref 3.5–5.0)
Alkaline Phosphatase: 147 U/L — ABNORMAL HIGH (ref 38–126)
Bilirubin, Direct: 0.1 mg/dL (ref 0.0–0.2)
Indirect Bilirubin: 0.4 mg/dL (ref 0.3–0.9)
Total Bilirubin: 0.5 mg/dL (ref 0.3–1.2)
Total Protein: 5.3 g/dL — ABNORMAL LOW (ref 6.5–8.1)

## 2022-08-04 LAB — GLUCOSE, CAPILLARY
Glucose-Capillary: 226 mg/dL — ABNORMAL HIGH (ref 70–99)
Glucose-Capillary: 292 mg/dL — ABNORMAL HIGH (ref 70–99)
Glucose-Capillary: 330 mg/dL — ABNORMAL HIGH (ref 70–99)
Glucose-Capillary: 353 mg/dL — ABNORMAL HIGH (ref 70–99)

## 2022-08-04 LAB — BASIC METABOLIC PANEL
Anion gap: 5 (ref 5–15)
BUN: 11 mg/dL (ref 8–23)
CO2: 24 mmol/L (ref 22–32)
Calcium: 8.6 mg/dL — ABNORMAL LOW (ref 8.9–10.3)
Chloride: 111 mmol/L (ref 98–111)
Creatinine, Ser: 0.73 mg/dL (ref 0.44–1.00)
GFR, Estimated: >60 mL/min (ref >60)
Glucose, Bld: 351 mg/dL — ABNORMAL HIGH (ref 70–99)
Potassium: 4 mmol/L (ref 3.5–5.1)
Sodium: 140 mmol/L (ref 135–145)

## 2022-08-04 MED ORDER — METFORMIN HCL 500 MG PO TABS: 500.0000 mg | ORAL_TABLET | Freq: Two times a day (BID) | ORAL | 1 refills | Status: DC

## 2022-08-04 MED ORDER — MELATONIN 5 MG PO TABS
5.0000 mg | ORAL_TABLET | ORAL | Status: AC
  Administered 2022-08-04: 5 mg via ORAL
  Filled 2022-08-04: qty 1

## 2022-08-04 MED ORDER — BLOOD GLUCOSE MONITOR KIT: PACK | 0 refills | Status: DC

## 2022-08-04 MED ORDER — ADULT MULTIVITAMIN W/MINERALS CH: 1.0000 | ORAL_TABLET | Freq: Every day | ORAL | Status: DC

## 2022-08-04 MED ORDER — LINAGLIPTIN 5 MG PO TABS: 5.0000 mg | ORAL_TABLET | Freq: Every day | ORAL | 1 refills | Status: DC

## 2022-08-04 NOTE — Discharge Summary (Addendum)
Physician Discharge Summary  Victoria Gates DVV:[id number] DOB: January 14, 1951 DOA: 07/31/2022  PCP: Ann Held, DO  Admit date: 07/31/2022 Discharge date: 08/04/2022  Admitted From: Home Disposition:  Home with 24/7 caregiver   Discharge Condition: Stable CODE STATUS: Full  Diet recommendation: Regular   Brief/Interim Summary: Victoria Gates is a 71 yo female with known DM type II, GERD, multiple myeloma in remission, hyperlipidemia, iron deficiency anemia, frontotemporal dementia, who was admitted with DKA, metabolic acidosis, AKI, and acute liver injury on 07/31/22 to ICU.  She initially presented to the emergency department with chief complaint of weakness over the past several days associated with encephalopathy.  On presentation, labs were consistent with DKA.  She was started on insulin drip.  Due to hypernatremia and altered mental status, cortrak was placed for nutrition.  DKA resolved and patient was transferred to [facility] service 11/4.  Patient continued to exhibit sundowning.  Per husband and son, patient has poor oral intake at baseline due to dementia.  The 3 of [place] discussed patient's improving lab work and her ongoing dementia with sundowning.  They agreed that patient would do better at home, to remove cortrak, encourage oral intake with Ensure and water at home and her normal surrounding with her 24-hour caregiver, rather than staying in hospital due to her advanced dementia.  Discharge Diagnoses:   Principal Problem:   DKA (diabetic ketoacidosis) ([place]) Active Problems:   Frontotemporal dementia ([place])   Hypernatremia   Altered mental status   Lactic acidosis   Malnutrition of moderate degree   AKI (acute kidney injury) ([place])   Acute metabolic encephalopathy   Transaminitis   DKA -Transitioned off insulin drip -A1c 9.1 -Patient with unreliable PO intake at baseline due to dementia so hesitant to place on insulin. Will trial tradjenta and metformin with close  outpatient follow up. Discussed with diabetes coordinator today.    Hypernatremia -Resolved with free water flushes. Encourage oral intake at home.    Acute metabolic encephalopathy in setting of frontotemporal dementia -CT head without evidence of acute intracranial abnormality -Delirium precaution -Remeron, Exelon -At baseline    Acute transaminitis -Right upper quadrant ultrasound showed large gallstone, sludge without gallbladder wall thickening -Hepatitis panel negative -Acetaminophen level negative -Improved, repeat labs outpatient    AKI -Resolved   Sepsis ruled out -Cultures negative, urine culture negative -No source of infection found   History of multiple myeloma -Currently in remission  Discharge Instructions  Discharge Instructions     Call MD for:  difficulty breathing, headache or visual disturbances   Complete by: As directed    Call MD for:  extreme fatigue   Complete by: As directed    Call MD for:  persistant dizziness or light-headedness   Complete by: As directed    Call MD for:  persistant nausea and vomiting   Complete by: As directed    Call MD for:  severe uncontrolled pain   Complete by: As directed    Call MD for:  temperature >100.4   Complete by: As directed    Diet general   Complete by: As directed    Discharge instructions   Complete by: As directed    You were cared for by a hospitalist during your hospital stay. If you have any questions about your discharge medications or the care you received while you were in the hospital after you are discharged, you can call the unit and ask to speak with the hospitalist on call if the  hospitalist that took care of you is not available. Once you are discharged, your primary care physician will handle any further medical issues. Please note that NO REFILLS for any discharge medications will be authorized once you are discharged, as it is imperative that you return to your primary care physician (or  establish a relationship with a primary care physician if you do not have one) for your aftercare needs so that they can reassess your need for medications and monitor your lab values.   Increase activity slowly   Complete by: As directed       Allergies as of 08/04/2022       Reactions   Atorvastatin    REACTION: myalgias   Rosuvastatin    REACTION: myalgias   Sulfonamide Derivatives    Unknown reaction         Medication List     STOP taking these medications    Calcium 500-125 MG-UNIT Tabs   cholecalciferol 1000 units tablet Commonly known as: VITAMIN D   [employer] Nasal Spray 1 % nasal spray Generic drug: phenylephrine   ibuprofen 600 MG tablet Commonly known as: ADVIL   loratadine 10 MG tablet Commonly known as: CLARITIN   naproxen 500 MG tablet Commonly known as: NAPROSYN   Potassium Chloride ER 20 MEQ Tbcr   vitamin C 1000 MG tablet       TAKE these medications    blood glucose meter kit and supplies Kit Dispense based on patient and insurance preference. Use daily to check blood sugar.   docusate sodium 100 MG capsule Commonly known as: COLACE Take 200 mg by mouth daily.   feeding supplement (ENSURE COMPLETE) Liqd Take 237 mLs by mouth 3 (three) times daily between meals.   lansoprazole 30 MG capsule Commonly known as: PREVACID Take 30 mg by mouth daily with breakfast.   linagliptin 5 MG Tabs tablet Commonly known as: Tradjenta Take 1 tablet (5 mg total) by mouth daily.   metFORMIN 500 MG tablet Commonly known as: GLUCOPHAGE Take 1 tablet (500 mg total) by mouth 2 (two) times daily with a meal.   mirtazapine 30 MG tablet Commonly known as: REMERON Take 1 tablet (30 mg total) by mouth at bedtime.   multivitamin with minerals Tabs tablet Take 1 tablet by mouth daily. Start taking on: August 05, 2022   Pitavastatin Calcium 4 MG Tabs Take 1 tablet (4 mg total) by mouth daily.   rivastigmine 1.5 MG capsule Commonly known as:  EXELON Take 1 capsule (1.5 mg total) by mouth 2 (two) times daily.        Follow-up Information     Ann Held, DO Follow up.   Specialty: Family Medicine Why: For diabetes follow up Contact information: [address] [place] [address] [phone]                Allergies  Allergen Reactions   Atorvastatin     REACTION: myalgias   Rosuvastatin     REACTION: myalgias   Sulfonamide Derivatives     Unknown reaction       Procedures/Studies: DG Abd Portable 1V  Result Date: 08/02/2022 CLINICAL DATA:  Feeding tube placement. EXAM: PORTABLE ABDOMEN - 1 VIEW COMPARISON:  August 01, 2022. FINDINGS: Distal tip of feeding tube is seen in expected position of distal stomach. IMPRESSION: Distal tip of feeding tube seen in expected position of distal stomach. Electronically Signed   By: Bobbe Medico.D.  On: 08/02/2022 12:40   DG Abd Portable 1V  Result Date: 08/01/2022 CLINICAL DATA:  NG tube placement EXAM: PORTABLE ABDOMEN - 1 VIEW COMPARISON:  CT done on 07/31/2022 FINDINGS: Tip of NG tube is seen in the region of body of stomach. Bowel gas pattern is nonspecific. No abnormal masses or calcifications are seen. Lower pelvis is not included in the image. IMPRESSION: Tip of NG tube is seen in the stomach. Electronically Signed   By: Elmer Picker M.D.   On: 08/01/2022 12:55   [place] EKG SITE RITE  Result Date: 08/01/2022 If Site Rite image not attached, placement could not be confirmed due to current cardiac rhythm.  CT ABDOMEN PELVIS WO CONTRAST  Result Date: 07/31/2022 CLINICAL DATA:  Fatigue and encephalopathy. Acute liver and kidney injury. History of multiple myeloma. EXAM: CT ABDOMEN AND PELVIS WITHOUT CONTRAST TECHNIQUE: Multidetector CT imaging of the abdomen and pelvis was performed following the standard protocol without IV contrast. RADIATION DOSE REDUCTION: This exam was performed according to the departmental  dose-optimization program which includes automated exposure control, adjustment of the mA and/or kV according to patient size and/or use of iterative reconstruction technique. COMPARISON:  Right upper quadrant ultrasound from same day. Abdominal ultrasound dated September 25, 2010. FINDINGS: Lower chest: No acute abnormality. Mild subsegmental atelectasis at both lung bases. Hepatobiliary: No focal liver abnormality is seen. No gallstones, gallbladder wall thickening, or biliary dilatation. Pancreas: Unremarkable. No pancreatic ductal dilatation or surrounding inflammatory changes. Spleen: Normal in size without focal abnormality. Adrenals/Urinary Tract: Adrenal glands are unremarkable. Kidneys are normal, without renal calculi, focal lesion, or hydronephrosis. Bladder is unremarkable. Stomach/Bowel: Small hiatal hernia. The stomach is otherwise within normal limits. No bowel wall thickening, distention, or surrounding inflammatory changes. Normal appendix. Vascular/Lymphatic: Aortic atherosclerosis. No enlarged abdominal or pelvic lymph nodes. Reproductive: Uterus and bilateral adnexa are unremarkable. Other: No abdominal wall hernia or abnormality. No abdominopelvic ascites. No pneumoperitoneum. Musculoskeletal: Multiple scattered lucent lesions in the visualized spine and pelvis. No acute fracture. IMPRESSION: 1. No acute intra-abdominal process. 2. Multiple scattered lucent lesions in the visualized spine and pelvis, consistent with history of multiple myeloma. 3.  Aortic Atherosclerosis (ICD10-I70.0). Electronically Signed   By: Titus Dubin M.D.   On: 07/31/2022 13:39   CT HEAD WO CONTRAST (5MM)  Result Date: 07/31/2022 CLINICAL DATA:  Mental status change, persistent or worsening EXAM: CT HEAD WITHOUT CONTRAST TECHNIQUE: Contiguous axial images were obtained from the base of the skull through the vertex without intravenous contrast. RADIATION DOSE REDUCTION: This exam was performed according to the  departmental dose-optimization program which includes automated exposure control, adjustment of the mA and/or kV according to patient size and/or use of iterative reconstruction technique. COMPARISON:  CT head 04/16/2021. FINDINGS: Brain: No evidence of acute infarction, hemorrhage, hydrocephalus, extra-axial collection or mass lesion/mass effect. Vascular: Calcific atherosclerosis. Skull: No acute fracture.  Hyperostosis frontalis. Sinuses/Orbits: Clear visualized sinuses. No acute orbital findings. Other: Cerumen in bilateral EACs.  No mastoid effusions. IMPRESSION: No evidence of acute intracranial abnormality. Electronically Signed   By: Margaretha Sheffield M.D.   On: 07/31/2022 13:21   US Abdomen Limited RUQ (LIVER/GB)  Result Date: 07/31/2022 CLINICAL DATA:  Elevated ALT level. EXAM: ULTRASOUND ABDOMEN LIMITED RIGHT UPPER QUADRANT COMPARISON:  September 25, 2010. FINDINGS: Gallbladder: There is the suggestion of a large stone or tumefactive sludge within the gallbladder lumen. No gallbladder wall thickening or pericholecystic fluid is noted. No sonographic Murphy's sign is noted. Common bile duct: Diameter: 4 mm which  is within normal limits. Liver: No focal lesion identified. Increased echogenicity of hepatic parenchyma is noted suggesting hepatic steatosis. Portal vein is patent on color Doppler imaging with normal direction of blood flow towards the liver. Other: None. IMPRESSION: There is either a large gallstone or tumefactive sludge seen within the gallbladder lumen. No definite gallbladder wall thickening or sonographic Murphy's sign is noted. Probable hepatic steatosis. Electronically Signed   By: Marijo Conception M.D.   On: 07/31/2022 11:14   DG Chest Port 1 View  Result Date: 07/31/2022 CLINICAL DATA:  Pt exam order states amsQuestionable sepsis - evaluate for abnormality EXAM: PORTABLE CHEST 1 VIEW COMPARISON:  None Available. FINDINGS: Normal mediastinum and cardiac silhouette. Normal pulmonary  vasculature. No evidence of effusion, infiltrate, or pneumothorax. No acute bony abnormality. IMPRESSION: No acute cardiopulmonary process. Electronically Signed   By: Suzy Bouchard M.D.   On: 07/31/2022 09:05      Discharge Exam: Vitals:   08/03/22 2048 08/04/22 0513  BP:  110/69  Pulse: 71 97  Resp: 18 18  Temp:  (!) 97.5 F (36.4 C)  SpO2: 96% 100%    General: Pt is alert to voice, awake, not in acute distress Cardiovascular: RRR, S1/S2 +, no edema Respiratory: CTA bilaterally, no wheezing, no rhonchi, no respiratory distress, no conversational dyspnea  Abdominal: Soft, NT, ND, bowel sounds + Extremities: no edema, no cyanosis Psych: +Dementia     The results of significant diagnostics from this hospitalization (including imaging, microbiology, ancillary and laboratory) are listed below for reference.     Microbiology: Recent Results (from the past 240 hour(s))  Blood Culture (routine x 2)     Status: None (Preliminary result)   Collection Time: 07/31/22  8:49 AM   Specimen: BLOOD  Result Value Ref Range Status   Specimen Description BLOOD LEFT ANTECUBITAL  Final   Special Requests   Final    BOTTLES DRAWN AEROBIC AND ANAEROBIC Blood Culture adequate volume   Culture   Final    NO GROWTH 4 DAYS Performed at [facility], [address] [place], [place] [address]    Report Status PENDING  Incomplete  Urine Culture     Status: None   Collection Time: 07/31/22  8:49 AM   Specimen: In/Out Cath Urine  Result Value Ref Range Status   Specimen Description IN/OUT CATH URINE  Final   Special Requests NONE  Final   Culture   Final    NO GROWTH Performed at [facility], [place] [address] [place], [place] [address]    Report Status 08/01/2022 FINAL  Final     Labs: BNP (last 3 results) No results for input(s): "BNP" in the last 8760 hours. Basic Metabolic Panel: Recent Labs  Lab 07/31/22 1615 07/31/22 1914 08/01/22 0820 08/01/22 1958 08/02/22 0538  08/02/22 1735 08/03/22 0310 08/03/22 1840 08/04/22 0500  NA 169*   < > 168*   < > 160* 149* 151* 146* 140  K 3.3*   < > 4.2   < > 3.4* 5.4* 3.8 3.7 4.0  CL 128*   < > >130*   < > 127* 121* 121* 113* 111  CO2 24   < > 25   < > _0 GLUCOSE 345*   < > 236*   < > 161* 191* 239* 252* 351*  BUN 51*   < > 29*   < > _1 CREATININE 1.56*   < >  1.19*   < > 1.01* 0.75 0.82 0.80 0.73  CALCIUM 10.0   < > 9.1   < > 8.3* 8.2* 8.8* 8.7* 8.6*  MG 2.8*  --  2.4  --  2.0 2.1 2.2  --   --   PHOS 2.4*  --  2.6  --  3.3 2.9 3.1  --   --    < > = values in this interval not displayed.   Liver Function Tests: Recent Labs  Lab 07/31/22 1615 08/01/22 0334 08/02/22 0538 08/03/22 0310 08/04/22 0500  AST 565* 376* 186* 116* 119*  ALT 1,395* 1,093* 637* 449* 314*  ALKPHOS 184* 159* 117 130* 147*  BILITOT 0.4 0.4 0.8 0.4 0.5  PROT 7.3 6.7 5.8* 5.7* 5.3*  ALBUMIN 2.9* 2.8* 2.3* 2.3* 2.1*   No results for input(s): "LIPASE", "AMYLASE" in the last 168 hours. Recent Labs  Lab 08/02/22 0958  AMMONIA 55*   CBC: Recent Labs  Lab 07/31/22 0914 07/31/22 1004 08/01/22 0334 08/02/22 0958 08/03/22 0310  WBC 11.7*  --  13.3* 9.3 7.9  NEUTROABS 9.2*  --   --   --   --   HGB 16.8* 16.7* 14.1 13.6 11.7*  HCT 54.1* 49.0* 46.4* 43.9 37.3  MCV 91.4  --  91.5 91.1 88.8  PLT 144*  --  99* 76* 66*   Cardiac Enzymes: No results for input(s): "CKTOTAL", "CKMB", "CKMBINDEX", "TROPONINI" in the last 168 hours. BNP: Invalid input(s): "POCBNP" CBG: Recent Labs  Lab 08/03/22 2023 08/04/22 0035 08/04/22 0519 08/04/22 0752 08/04/22 1202  GLUCAP 207* 226* 292* 330* 353*   D-Dimer No results for input(s): "DDIMER" in the last 72 hours. Hgb A1c No results for input(s): "HGBA1C" in the last 72 hours. Lipid Profile No results for input(s): "CHOL", "HDL", "LDLCALC", "TRIG", "CHOLHDL", "LDLDIRECT" in the last 72 hours. Thyroid function studies No results for input(s): "TSH", "T4TOTAL",  "T3FREE", "THYROIDAB" in the last 72 hours.  Invalid input(s): "FREET3" Anemia work up No results for input(s): "VITAMINB12", "FOLATE", "FERRITIN", "TIBC", "IRON", "RETICCTPCT" in the last 72 hours. Urinalysis    Component Value Date/Time   COLORURINE YELLOW 07/31/2022 0929   APPEARANCEUR HAZY (A) 07/31/2022 0929   LABSPEC 1.031 (H) 07/31/2022 0929   PHURINE 5.0 07/31/2022 0929   GLUCOSEU >=500 (A) 07/31/2022 0929   GLUCOSEU NEGATIVE 09/01/2017 0847   HGBUR NEGATIVE 07/31/2022 0929   BILIRUBINUR NEGATIVE 07/31/2022 0929   KETONESUR 5 (A) 07/31/2022 0929   PROTEINUR NEGATIVE 07/31/2022 0929   UROBILINOGEN 0.2 09/01/2017 0847   NITRITE NEGATIVE 07/31/2022 0929   LEUKOCYTESUR NEGATIVE 07/31/2022 0929   Sepsis Labs Recent Labs  Lab 07/31/22 0914 08/01/22 0334 08/02/22 0958 08/03/22 0310  WBC 11.7* 13.3* 9.3 7.9   Microbiology Recent Results (from the past 240 hour(s))  Blood Culture (routine x 2)     Status: None (Preliminary result)   Collection Time: 07/31/22  8:49 AM   Specimen: BLOOD  Result Value Ref Range Status   Specimen Description BLOOD LEFT ANTECUBITAL  Final   Special Requests   Final    BOTTLES DRAWN AEROBIC AND ANAEROBIC Blood Culture adequate volume   Culture   Final    NO GROWTH 4 DAYS Performed at [facility], [address] [place],  [address]    Report Status PENDING  Incomplete  Urine Culture     Status: None   Collection Time: 07/31/22  8:49 AM   Specimen: In/Out Cath Urine  Result Value Ref Range Status  Specimen Description IN/OUT CATH URINE  Final   Special Requests NONE  Final   Culture   Final    NO GROWTH Performed at [facility], [place] [address], [place], [place] [address]    Report Status 08/01/2022 FINAL  Final     Patient was seen and examined on the day of discharge and was found to be in stable condition. Time coordinating discharge: 40 minutes including assessment and coordination of care, as well as  examination of the patient.   SIGNED:  Dessa Phi, DO [facility] 08/04/2022, 12:16 PM

## 2022-08-04 NOTE — Progress Notes (Signed)
Patient has been discharged.

## 2022-08-05 ENCOUNTER — Telehealth: Payer: Self-pay

## 2022-08-05 LAB — CULTURE, BLOOD (ROUTINE X 2)
Culture: NO GROWTH
Special Requests: ADEQUATE

## 2022-08-05 NOTE — Telephone Encounter (Signed)
Transition Care Management Follow-up Telephone Call Date of discharge and from where: [facility] 08-04-22 Dx: DKA How have you been since you were released from the hospital? Doing better  Any questions or concerns? No  Items Reviewed: Did the pt receive and understand the discharge instructions provided? Yes  Medications obtained and verified? Yes  Other? No  Any new allergies since your discharge? No  Dietary orders reviewed? Yes Do you have support at home? Yes   Home Care and Equipment/Supplies: Were home health services ordered? no If so, what is the name of the agency? na  Has the agency set up a time to come to the patient's home? not applicable Were any new equipment or medical supplies ordered?  No What is the name of the medical supply agency? na Were you able to get the supplies/equipment? not applicable Do you have any questions related to the use of the equipment or supplies? No  Functional Questionnaire: (I = Independent and D = Dependent) ADLs: I  Bathing/Dressing- I- WITH ASSISTANCE   Meal Prep- I  Eating- I  Maintaining continence- I  Transferring/Ambulation- I  Managing Meds- D- HUSBAND MANAGES   Follow up appointments reviewed:  PCP Hospital f/u appt confirmed? Yes  Scheduled to see Dr Carollee Herter on 08-12-22 @ 1120am. [facility] f/u appt confirmed? No  . Are transportation arrangements needed? No  If their condition worsens, is the pt aware to call PCP or go to the Emergency Dept.? Yes Was the patient provided with contact information for the PCP's office or ED? Yes Was to pt encouraged to call back with questions or concerns? Yes   Juanda Crumble LPN [place] [facility] [phone]

## 2022-08-06 ENCOUNTER — Inpatient Hospital Stay (HOSPITAL_COMMUNITY)
Admission: EM | Admit: 2022-08-06 | Discharge: 2022-08-10 | Disposition: A | Discharge status: Home Health | Payer: BC Managed Care – PPO | Source: Home / Self Care | Attending: Internal Medicine | Admitting: Internal Medicine

## 2022-08-06 ENCOUNTER — Telehealth: Payer: Self-pay | Admitting: Family Medicine

## 2022-08-06 DIAGNOSIS — Z801 Family history of malignant neoplasm of trachea, bronchus and lung: Secondary | ICD-10-CM

## 2022-08-06 DIAGNOSIS — Z8249 Family history of ischemic heart disease and other diseases of the circulatory system: Secondary | ICD-10-CM

## 2022-08-06 DIAGNOSIS — L89152 Pressure ulcer of sacral region, stage 2: Secondary | ICD-10-CM | POA: Diagnosis present

## 2022-08-06 DIAGNOSIS — L89302 Pressure ulcer of unspecified buttock, stage 2: Secondary | ICD-10-CM | POA: Diagnosis present

## 2022-08-06 DIAGNOSIS — R627 Adult failure to thrive: Secondary | ICD-10-CM | POA: Diagnosis present

## 2022-08-06 DIAGNOSIS — Z882 Allergy status to sulfonamides status: Secondary | ICD-10-CM

## 2022-08-06 DIAGNOSIS — I10 Essential (primary) hypertension: Secondary | ICD-10-CM | POA: Diagnosis present

## 2022-08-06 DIAGNOSIS — G3109 Other frontotemporal dementia: Principal | ICD-10-CM | POA: Diagnosis present

## 2022-08-06 DIAGNOSIS — E440 Moderate protein-calorie malnutrition: Secondary | ICD-10-CM | POA: Diagnosis present

## 2022-08-06 DIAGNOSIS — E119 Type 2 diabetes mellitus without complications: Secondary | ICD-10-CM

## 2022-08-06 DIAGNOSIS — C9001 Multiple myeloma in remission: Secondary | ICD-10-CM

## 2022-08-06 DIAGNOSIS — R630 Anorexia: Secondary | ICD-10-CM

## 2022-08-06 DIAGNOSIS — Z808 Family history of malignant neoplasm of other organs or systems: Secondary | ICD-10-CM

## 2022-08-06 DIAGNOSIS — F0280 Dementia in other diseases classified elsewhere without behavioral disturbance: Secondary | ICD-10-CM | POA: Diagnosis present

## 2022-08-06 DIAGNOSIS — Z7984 Long term (current) use of oral hypoglycemic drugs: Secondary | ICD-10-CM

## 2022-08-06 DIAGNOSIS — F03C0 Unspecified dementia, severe, without behavioral disturbance, psychotic disturbance, mood disturbance, and anxiety: Principal | ICD-10-CM

## 2022-08-06 DIAGNOSIS — R748 Abnormal levels of other serum enzymes: Secondary | ICD-10-CM

## 2022-08-06 DIAGNOSIS — F419 Anxiety disorder, unspecified: Secondary | ICD-10-CM

## 2022-08-06 DIAGNOSIS — E785 Hyperlipidemia, unspecified: Secondary | ICD-10-CM | POA: Diagnosis present

## 2022-08-06 DIAGNOSIS — D6959 Other secondary thrombocytopenia: Secondary | ICD-10-CM | POA: Diagnosis present

## 2022-08-06 DIAGNOSIS — K7210 Chronic hepatic failure without coma: Secondary | ICD-10-CM | POA: Diagnosis present

## 2022-08-06 DIAGNOSIS — E860 Dehydration: Secondary | ICD-10-CM | POA: Diagnosis present

## 2022-08-06 DIAGNOSIS — E441 Mild protein-calorie malnutrition: Secondary | ICD-10-CM

## 2022-08-06 DIAGNOSIS — Z79899 Other long term (current) drug therapy: Secondary | ICD-10-CM

## 2022-08-06 DIAGNOSIS — Z888 Allergy status to other drugs, medicaments and biological substances status: Secondary | ICD-10-CM

## 2022-08-06 DIAGNOSIS — D696 Thrombocytopenia, unspecified: Secondary | ICD-10-CM

## 2022-08-06 DIAGNOSIS — C9000 Multiple myeloma not having achieved remission: Secondary | ICD-10-CM | POA: Diagnosis present

## 2022-08-06 DIAGNOSIS — D649 Anemia, unspecified: Secondary | ICD-10-CM | POA: Diagnosis present

## 2022-08-06 DIAGNOSIS — Z833 Family history of diabetes mellitus: Secondary | ICD-10-CM

## 2022-08-06 DIAGNOSIS — E7800 Pure hypercholesterolemia, unspecified: Secondary | ICD-10-CM | POA: Diagnosis present

## 2022-08-06 DIAGNOSIS — R7989 Other specified abnormal findings of blood chemistry: Secondary | ICD-10-CM

## 2022-08-06 DIAGNOSIS — M810 Age-related osteoporosis without current pathological fracture: Secondary | ICD-10-CM | POA: Diagnosis present

## 2022-08-06 DIAGNOSIS — Z6825 Body mass index (BMI) 25.0-25.9, adult: Secondary | ICD-10-CM

## 2022-08-06 DIAGNOSIS — L8990 Pressure ulcer of unspecified site, unspecified stage: Secondary | ICD-10-CM | POA: Insufficient documentation

## 2022-08-06 LAB — COMPREHENSIVE METABOLIC PANEL
ALT: 194 U/L — ABNORMAL HIGH (ref 0–44)
AST: 77 U/L — ABNORMAL HIGH (ref 15–41)
Albumin: 3 g/dL — ABNORMAL LOW (ref 3.5–5.0)
Alkaline Phosphatase: 137 U/L — ABNORMAL HIGH (ref 38–126)
Anion gap: 12 (ref 5–15)
BUN: 11 mg/dL (ref 8–23)
CO2: 21 mmol/L — ABNORMAL LOW (ref 22–32)
Calcium: 9.3 mg/dL (ref 8.9–10.3)
Chloride: 108 mmol/L (ref 98–111)
Creatinine, Ser: 0.83 mg/dL (ref 0.44–1.00)
GFR, Estimated: >60 mL/min (ref >60)
Glucose, Bld: 247 mg/dL — ABNORMAL HIGH (ref 70–99)
Potassium: 3.7 mmol/L (ref 3.5–5.1)
Sodium: 141 mmol/L (ref 135–145)
Total Bilirubin: 0.7 mg/dL (ref 0.3–1.2)
Total Protein: 7.2 g/dL (ref 6.5–8.1)

## 2022-08-06 LAB — LIPASE, BLOOD: Lipase: 52 U/L — ABNORMAL HIGH (ref 11–51)

## 2022-08-06 MED ORDER — LINAGLIPTIN 5 MG PO TABS: 5.0000 mg | ORAL_TABLET | Freq: Every day | ORAL | 0 refills | Status: DC

## 2022-08-06 MED ORDER — SODIUM CHLORIDE 0.9 % IV BOLUS
1000.0000 mL | Freq: Once | INTRAVENOUS | Status: AC
  Administered 2022-08-06: 1000 mL via INTRAVENOUS

## 2022-08-06 NOTE — Telephone Encounter (Signed)
Rx sent 

## 2022-08-06 NOTE — ED Triage Notes (Signed)
Pt BIB [facility] from home after family had concerns about pt not eating and drinking anything today. Per family, pt ate like normal yesterday. Dementia at baseline, ambulatory.  VSS, CBG 296 HX- DM

## 2022-08-06 NOTE — ED Provider Notes (Signed)
Care assumed from Dr. Ashok Cordia, patient recently discharged for hypernatremia, not eating at home. Labs are pending. Will either need readmission, or TOC consult.  I have reviewed and interpreted the laboratory test, and my interpretation is mild elevations of transaminases and alkaline phosphatase which are trending down compared with 08/04/2022.  CBC and urinalysis are pending.    Urinalysis is normal except for presence of glucose.  CBC shows significant anemia with hemoglobin 8.2, and thrombocytopenia with platelet count of 70.  This is a considerable drop from the most recent hemoglobin of 11.7 on 11/4.  Platelet count is essentially unchanged.  I reviewed additional blood work from her recent hospitalization, and hemoglobin has trended down from 16.8 on 11/1.  However, that was high partly secondary to dehydration.  Hemoglobin prior to her hospitalization was 12.7 on 03/15/2022.  Thrombocytopenia is new compared with 03/15/2022 family is now present and they state that patient has had some loose stools but no blood or melena.  I have discussed the case with Dr. Claria Dice of [facility] who requests that the hemoglobin be repeated to make sure it is not a laboratory error, and agrees to come and evaluate the patient for admission.  Results for orders placed or performed during the hospital encounter of 08/06/22  Comprehensive metabolic panel  Result Value Ref Range   Sodium 141 135 - 145 mmol/L   Potassium 3.7 3.5 - 5.1 mmol/L   Chloride 108 98 - 111 mmol/L   CO2 21 (L) 22 - 32 mmol/L   Glucose, Bld 247 (H) 70 - 99 mg/dL   BUN 11 8 - 23 mg/dL   Creatinine, Ser 0.83 0.44 - 1.00 mg/dL   Calcium 9.3 8.9 - 10.3 mg/dL   Total Protein 7.2 6.5 - 8.1 g/dL   Albumin 3.0 (L) 3.5 - 5.0 g/dL   AST 77 (H) 15 - 41 U/L   ALT 194 (H) 0 - 44 U/L   Alkaline Phosphatase 137 (H) 38 - 126 U/L   Total Bilirubin 0.7 0.3 - 1.2 mg/dL   GFR, Estimated >60 >60 mL/min   Anion gap 12 5 - 15  Lipase, blood  Result  Value Ref Range   Lipase 52 (H) 11 - 51 U/L  CBC with Differential  Result Value Ref Range   WBC 4.3 4.0 - 10.5 K/uL   RBC 2.85 (L) 3.87 - 5.11 MIL/uL   Hemoglobin 8.2 (L) 12.0 - 15.0 g/dL   HCT 25.2 (L) 36.0 - 46.0 %   MCV 88.4 80.0 - 100.0 fL   MCH 28.8 26.0 - 34.0 pg   MCHC 32.5 30.0 - 36.0 g/dL   RDW 13.3 11.5 - 15.5 %   Platelets 70 (L) 150 - 400 K/uL   nRBC 0.5 (H) 0.0 - 0.2 %   Neutrophils Relative % 43 %   Neutro Abs 1.9 1.7 - 7.7 K/uL   Lymphocytes Relative 44 %   Lymphs Abs 1.9 0.7 - 4.0 K/uL   Monocytes Relative 11 %   Monocytes Absolute 0.5 0.1 - 1.0 K/uL   Eosinophils Relative 1 %   Eosinophils Absolute 0.0 0.0 - 0.5 K/uL   Basophils Relative 0 %   Basophils Absolute 0.0 0.0 - 0.1 K/uL   Immature Granulocytes 1 %   Abs Immature Granulocytes 0.02 0.00 - 0.07 K/uL  Urinalysis, Routine w reflex microscopic Urine, Catheterized  Result Value Ref Range   Color, Urine YELLOW YELLOW   APPearance CLEAR CLEAR   Specific Gravity, Urine 1.022  1.005 - 1.030   pH 5.0 5.0 - 8.0   Glucose, UA >=500 (A) NEGATIVE mg/dL   Hgb urine dipstick NEGATIVE NEGATIVE   Bilirubin Urine NEGATIVE NEGATIVE   Ketones, ur NEGATIVE NEGATIVE mg/dL   Protein, ur NEGATIVE NEGATIVE mg/dL   Nitrite NEGATIVE NEGATIVE   Leukocytes,Ua NEGATIVE NEGATIVE   RBC / HPF 0-5 0 - 5 RBC/hpf   WBC, UA 0-5 0 - 5 WBC/hpf   Bacteria, UA NONE SEEN NONE SEEN   Squamous Epithelial / LPF 0-5 0 - 5   Mucus PRESENT       Delora Fuel, MD 20/99/06 [phone]

## 2022-08-06 NOTE — ED Provider Notes (Signed)
[facility] EMERGENCY DEPARTMENT Provider Note   CSN: [id number] Arrival date & time: 08/06/22  2213     History  Chief Complaint  Patient presents with   Failure To Thrive    Victoria Gates is a 71 y.o. female.  Pt with hx advanced dementia, with general weakness, and not eating/drinking well in past day.  Pt limited historian - level 5 caveat, dementia. No report of fevers. No report of trauma or fall. Recently admitted with dehydration, hyperglycemia and hypernatremia. EMS notes CBG 296.  The history is provided by the patient, medical records and the EMS personnel. The history is limited by the condition of the patient.       Home Medications Prior to Admission medications   Medication Sig Start Date End Date Taking? Authorizing Provider  blood glucose meter kit and supplies KIT Dispense based on patient and insurance preference. Use daily to check blood sugar. 08/04/22   Dessa Phi, DO  docusate sodium (COLACE) 100 MG capsule Take 200 mg by mouth daily.    [provider]  feeding supplement, ENSURE COMPLETE, (ENSURE COMPLETE) LIQD Take 237 mLs by mouth 3 (three) times daily between meals.    [provider]  lansoprazole (PREVACID) 30 MG capsule Take 30 mg by mouth daily with breakfast.     [provider]  linagliptin (TRADJENTA) 5 MG TABS tablet Take 1 tablet (5 mg total) by mouth daily. Pt needs to follow up with Endocrinology 08/06/22   Carollee Herter, Alferd Apa, DO  metFORMIN (GLUCOPHAGE) 500 MG tablet Take 1 tablet (500 mg total) by mouth 2 (two) times daily with a meal. 08/04/22   Dessa Phi, DO  mirtazapine (REMERON) 30 MG tablet Take 1 tablet (30 mg total) by mouth at bedtime. 04/16/22   Ann Held, DO  Multiple Vitamin (MULTIVITAMIN WITH MINERALS) TABS tablet Take 1 tablet by mouth daily. 08/05/22   Dessa Phi, DO  Pitavastatin Calcium 4 MG TABS Take 1 tablet (4 mg total) by mouth daily. Patient not taking:  Reported on 08/05/2022 05/03/22   Carollee Herter, Alferd Apa, DO  rivastigmine (EXELON) 1.5 MG capsule Take 1 capsule (1.5 mg total) by mouth 2 (two) times daily. 05/15/22   Narda Amber K, DO      Allergies    Atorvastatin, Rosuvastatin, and Sulfonamide derivatives    Review of Systems   Review of Systems  Unable to perform ROS: Dementia    Physical Exam Updated Vital Signs BP (!) 107/95 (BP Location: Right Arm)   Pulse 99   Resp 17   SpO2 99%  Physical Exam Vitals and nursing note reviewed.  Constitutional:      Appearance: Normal appearance. She is well-developed.  HENT:     Head: Atraumatic.     Nose: Nose normal.     Mouth/Throat:     Mouth: Mucous membranes are moist.  Eyes:     General: No scleral icterus.    Conjunctiva/sclera: Conjunctivae normal.     Pupils: Pupils are equal, round, and reactive to light.  Neck:     Trachea: No tracheal deviation.     Comments: No stiffness or rigidity.  Cardiovascular:     Rate and Rhythm: Normal rate and regular rhythm.     Pulses: Normal pulses.     Heart sounds: Normal heart sounds. No murmur heard.    No friction rub. No gallop.  Pulmonary:     Effort: Pulmonary effort is normal. No respiratory distress.  Breath sounds: Normal breath sounds.  Abdominal:     General: Bowel sounds are normal. There is no distension.     Palpations: Abdomen is soft.     Tenderness: There is no abdominal tenderness.  Genitourinary:    Comments: No cva tenderness.  Musculoskeletal:        General: No swelling or tenderness.     Cervical back: Normal range of motion and neck supple. No rigidity. No muscular tenderness.  Skin:    General: Skin is warm and dry.     Findings: No rash.  Neurological:     Mental Status: She is alert.     Comments: Alert, answers some questions 'yes' and 'no'. Intermittently makes loud repetitive vocalizations - reported as baseline for pt. Moves bil extremities purposefully w good strength.   Psychiatric:      Comments: Alert, content      ED Results / Procedures / Treatments   Labs (all labs ordered are listed, but only abnormal results are displayed) Results for orders placed or performed during the hospital encounter of 07/31/22  Blood Culture (routine x 2)   Specimen: BLOOD  Result Value Ref Range   Specimen Description BLOOD LEFT ANTECUBITAL    Special Requests      BOTTLES DRAWN AEROBIC AND ANAEROBIC Blood Culture adequate volume   Culture      NO GROWTH 5 DAYS Performed at [facility], [place] [address], [place], [place] [address]    Report Status 08/05/2022 FINAL   Urine Culture   Specimen: In/Out Cath Urine  Result Value Ref Range   Specimen Description IN/OUT CATH URINE    Special Requests NONE    Culture      NO GROWTH Performed at [facility], [place] [address], [place], [place] [address]    Report Status 08/01/2022 FINAL   Lactic acid, plasma  Result Value Ref Range   Lactic Acid, Venous 3.9 (HH) 0.5 - 1.9 mmol/L  Comprehensive metabolic panel  Result Value Ref Range   Sodium 165 (HH) 135 - 145 mmol/L   Potassium 4.2 3.5 - 5.1 mmol/L   Chloride 118 (H) 98 - 111 mmol/L   CO2 22 22 - 32 mmol/L   Glucose, Bld 815 (HH) 70 - 99 mg/dL   BUN 74 (H) 8 - 23 mg/dL   Creatinine, Ser 2.47 (H) 0.44 - 1.00 mg/dL   Calcium 10.9 (H) 8.9 - 10.3 mg/dL   Total Protein 9.3 (H) 6.5 - 8.1 g/dL   Albumin 3.9 3.5 - 5.0 g/dL   AST 1,028 (H) 15 - 41 U/L   ALT 1,953 (H) 0 - 44 U/L   Alkaline Phosphatase 238 (H) 38 - 126 U/L   Total Bilirubin 1.0 0.3 - 1.2 mg/dL   GFR, Estimated 20 (L) >60 mL/min   Anion gap 25 (H) 5 - 15  CBC with Differential  Result Value Ref Range   WBC 11.7 (H) 4.0 - 10.5 K/uL   RBC 5.92 (H) 3.87 - 5.11 MIL/uL   Hemoglobin 16.8 (H) 12.0 - 15.0 g/dL   HCT 54.1 (H) 36.0 - 46.0 %   MCV 91.4 80.0 - 100.0 fL   MCH 28.4 26.0 - 34.0 pg   MCHC 31.1 30.0 - 36.0 g/dL   RDW 13.7 11.5 - 15.5 %   Platelets 144 (L) 150 - 400 K/uL   nRBC 0.0 0.0 - 0.2 %    Neutrophils Relative % 79 %   Neutro Abs 9.2 (H)  1.7 - 7.7 K/uL   Lymphocytes Relative 16 %   Lymphs Abs 1.9 0.7 - 4.0 K/uL   Monocytes Relative 5 %   Monocytes Absolute 0.6 0.1 - 1.0 K/uL   Eosinophils Relative 0 %   Eosinophils Absolute 0.0 0.0 - 0.5 K/uL   Basophils Relative 0 %   Basophils Absolute 0.0 0.0 - 0.1 K/uL   Immature Granulocytes 0 %   Abs Immature Granulocytes 0.04 0.00 - 0.07 K/uL  Protime-INR  Result Value Ref Range   Prothrombin Time 17.0 (H) 11.4 - 15.2 seconds   INR 1.4 (H) 0.8 - 1.2  APTT  Result Value Ref Range   aPTT 25 24 - 36 seconds  Urinalysis, Routine w reflex microscopic Urine, In & Out Cath  Result Value Ref Range   Color, Urine YELLOW YELLOW   APPearance HAZY (A) CLEAR   Specific Gravity, Urine 1.031 (H) 1.005 - 1.030   pH 5.0 5.0 - 8.0   Glucose, UA >=500 (A) NEGATIVE mg/dL   Hgb urine dipstick NEGATIVE NEGATIVE   Bilirubin Urine NEGATIVE NEGATIVE   Ketones, ur 5 (A) NEGATIVE mg/dL   Protein, ur NEGATIVE NEGATIVE mg/dL   Nitrite NEGATIVE NEGATIVE   Leukocytes,Ua NEGATIVE NEGATIVE   RBC / HPF 0-5 0 - 5 RBC/hpf   WBC, UA 0-5 0 - 5 WBC/hpf   Bacteria, UA NONE SEEN NONE SEEN   Mucus PRESENT    Hyaline Casts, UA PRESENT   Glucose, capillary  Result Value Ref Range   Glucose-Capillary 416 (H) 70 - 99 mg/dL  Glucose, capillary  Result Value Ref Range   Glucose-Capillary 358 (H) 70 - 99 mg/dL  Magnesium  Result Value Ref Range   Magnesium 2.8 (H) 1.7 - 2.4 mg/dL  Phosphorus  Result Value Ref Range   Phosphorus 2.4 (L) 2.5 - 4.6 mg/dL  Hepatic function panel  Result Value Ref Range   Total Protein 7.3 6.5 - 8.1 g/dL   Albumin 2.9 (L) 3.5 - 5.0 g/dL   AST 565 (H) 15 - 41 U/L   ALT 1,395 (H) 0 - 44 U/L   Alkaline Phosphatase 184 (H) 38 - 126 U/L   Total Bilirubin 0.4 0.3 - 1.2 mg/dL   Bilirubin, Direct <0.1 0.0 - 0.2 mg/dL   Indirect Bilirubin NOT CALCULATED 0.3 - 0.9 mg/dL  Lactic acid, plasma  Result Value Ref Range   Lactic Acid,  Venous 4.8 (HH) 0.5 - 1.9 mmol/L  Lactic acid, plasma  Result Value Ref Range   Lactic Acid, Venous 4.3 (HH) 0.5 - 1.9 mmol/L  Basic metabolic panel  Result Value Ref Range   Sodium 169 (HH) 135 - 145 mmol/L   Potassium 3.3 (L) 3.5 - 5.1 mmol/L   Chloride 128 (H) 98 - 111 mmol/L   CO2 24 22 - 32 mmol/L   Glucose, Bld 345 (H) 70 - 99 mg/dL   BUN 51 (H) 8 - 23 mg/dL   Creatinine, Ser 1.56 (H) 0.44 - 1.00 mg/dL   Calcium 10.0 8.9 - 10.3 mg/dL   GFR, Estimated 35 (L) >60 mL/min   Anion gap 17 (H) 5 - 15  Basic metabolic panel  Result Value Ref Range   Sodium 171 (HH) 135 - 145 mmol/L   Potassium 3.7 3.5 - 5.1 mmol/L   Chloride >130 (HH) 98 - 111 mmol/L   CO2 26 22 - 32 mmol/L   Glucose, Bld 219 (H) 70 - 99 mg/dL   BUN 45 (H) 8 - 23 mg/dL  Creatinine, Ser 1.52 (H) 0.44 - 1.00 mg/dL   Calcium 9.6 8.9 - 10.3 mg/dL   GFR, Estimated 36 (L) >60 mL/min   Anion gap NOT CALCULATED 5 - 15  Basic metabolic panel  Result Value Ref Range   Sodium 168 (HH) 135 - 145 mmol/L   Potassium 4.1 3.5 - 5.1 mmol/L   Chloride >130 (HH) 98 - 111 mmol/L   CO2 26 22 - 32 mmol/L   Glucose, Bld 171 (H) 70 - 99 mg/dL   BUN 35 (H) 8 - 23 mg/dL   Creatinine, Ser 1.26 (H) 0.44 - 1.00 mg/dL   Calcium 9.3 8.9 - 10.3 mg/dL   GFR, Estimated 46 (L) >60 mL/min   Anion gap NOT CALCULATED 5 - 15  Beta-hydroxybutyric acid  Result Value Ref Range   Beta-Hydroxybutyric Acid 0.52 (H) 0.05 - 0.27 mmol/L  Glucose, capillary  Result Value Ref Range   Glucose-Capillary 272 (H) 70 - 99 mg/dL  Protime-INR  Result Value Ref Range   Prothrombin Time 18.0 (H) 11.4 - 15.2 seconds   INR 1.5 (H) 0.8 - 1.2  Comprehensive metabolic panel  Result Value Ref Range   Sodium 168 (HH) 135 - 145 mmol/L   Potassium 3.5 3.5 - 5.1 mmol/L   Chloride 130 (H) 98 - 111 mmol/L   CO2 29 22 - 32 mmol/L   Glucose, Bld 178 (H) 70 - 99 mg/dL   BUN 33 (H) 8 - 23 mg/dL   Creatinine, Ser 1.27 (H) 0.44 - 1.00 mg/dL   Calcium 9.1 8.9 - 10.3  mg/dL   Total Protein 6.7 6.5 - 8.1 g/dL   Albumin 2.8 (L) 3.5 - 5.0 g/dL   AST 376 (H) 15 - 41 U/L   ALT 1,093 (H) 0 - 44 U/L   Alkaline Phosphatase 159 (H) 38 - 126 U/L   Total Bilirubin 0.4 0.3 - 1.2 mg/dL   GFR, Estimated 45 (L) >60 mL/min   Anion gap 9 5 - 15  CBC  Result Value Ref Range   WBC 13.3 (H) 4.0 - 10.5 K/uL   RBC 5.07 3.87 - 5.11 MIL/uL   Hemoglobin 14.1 12.0 - 15.0 g/dL   HCT 46.4 (H) 36.0 - 46.0 %   MCV 91.5 80.0 - 100.0 fL   MCH 27.8 26.0 - 34.0 pg   MCHC 30.4 30.0 - 36.0 g/dL   RDW 13.9 11.5 - 15.5 %   Platelets 99 (L) 150 - 400 K/uL   nRBC 0.0 0.0 - 0.2 %  Glucose, capillary  Result Value Ref Range   Glucose-Capillary 252 (H) 70 - 99 mg/dL  Glucose, capillary  Result Value Ref Range   Glucose-Capillary 167 (H) 70 - 99 mg/dL  Glucose, capillary  Result Value Ref Range   Glucose-Capillary 189 (H) 70 - 99 mg/dL  Basic metabolic panel  Result Value Ref Range   Sodium 168 (HH) 135 - 145 mmol/L   Potassium 4.2 3.5 - 5.1 mmol/L   Chloride >130 (HH) 98 - 111 mmol/L   CO2 25 22 - 32 mmol/L   Glucose, Bld 236 (H) 70 - 99 mg/dL   BUN 29 (H) 8 - 23 mg/dL   Creatinine, Ser 1.19 (H) 0.44 - 1.00 mg/dL   Calcium 9.1 8.9 - 10.3 mg/dL   GFR, Estimated 49 (L) >60 mL/min   Anion gap NOT CALCULATED 5 - 15  Glucose, capillary  Result Value Ref Range   Glucose-Capillary 199 (H) 70 - 99 mg/dL  Glucose, capillary  Result Value Ref Range   Glucose-Capillary 182 (H) 70 - 99 mg/dL  Glucose, capillary  Result Value Ref Range   Glucose-Capillary 170 (H) 70 - 99 mg/dL  Glucose, capillary  Result Value Ref Range   Glucose-Capillary 133 (H) 70 - 99 mg/dL  Glucose, capillary  Result Value Ref Range   Glucose-Capillary 156 (H) 70 - 99 mg/dL  Glucose, capillary  Result Value Ref Range   Glucose-Capillary 151 (H) 70 - 99 mg/dL  Glucose, capillary  Result Value Ref Range   Glucose-Capillary 172 (H) 70 - 99 mg/dL  Glucose, capillary  Result Value Ref Range    Glucose-Capillary 168 (H) 70 - 99 mg/dL  Glucose, capillary  Result Value Ref Range   Glucose-Capillary 204 (H) 70 - 99 mg/dL  Lactic acid, plasma  Result Value Ref Range   Lactic Acid, Venous 1.3 0.5 - 1.9 mmol/L  Glucose, capillary  Result Value Ref Range   Glucose-Capillary 188 (H) 70 - 99 mg/dL  Glucose, capillary  Result Value Ref Range   Glucose-Capillary 188 (H) 70 - 99 mg/dL  Hemoglobin A1c  Result Value Ref Range   Hgb A1c MFr Bld 9.1 (H) 4.8 - 5.6 %   Mean Plasma Glucose 214.47 mg/dL  Magnesium  Result Value Ref Range   Magnesium 2.4 1.7 - 2.4 mg/dL  Phosphorus  Result Value Ref Range   Phosphorus 2.6 2.5 - 4.6 mg/dL  Glucose, capillary  Result Value Ref Range   Glucose-Capillary 103 (H) 70 - 99 mg/dL  Glucose, capillary  Result Value Ref Range   Glucose-Capillary 162 (H) 70 - 99 mg/dL  Basic metabolic panel  Result Value Ref Range   Sodium 160 (H) 135 - 145 mmol/L   Potassium 3.4 (L) 3.5 - 5.1 mmol/L   Chloride 127 (H) 98 - 111 mmol/L   CO2 25 22 - 32 mmol/L   Glucose, Bld 161 (H) 70 - 99 mg/dL   BUN 18 8 - 23 mg/dL   Creatinine, Ser 1.01 (H) 0.44 - 1.00 mg/dL   Calcium 8.3 (L) 8.9 - 10.3 mg/dL   GFR, Estimated 60 (L) >60 mL/min   Anion gap 8 5 - 15  Lactic acid, plasma  Result Value Ref Range   Lactic Acid, Venous 1.2 0.5 - 1.9 mmol/L  Hepatic function panel  Result Value Ref Range   Total Protein 5.8 (L) 6.5 - 8.1 g/dL   Albumin 2.3 (L) 3.5 - 5.0 g/dL   AST 186 (H) 15 - 41 U/L   ALT 637 (H) 0 - 44 U/L   Alkaline Phosphatase 117 38 - 126 U/L   Total Bilirubin 0.8 0.3 - 1.2 mg/dL   Bilirubin, Direct 0.1 0.0 - 0.2 mg/dL   Indirect Bilirubin 0.7 0.3 - 0.9 mg/dL  Magnesium  Result Value Ref Range   Magnesium 2.0 1.7 - 2.4 mg/dL  Phosphorus  Result Value Ref Range   Phosphorus 3.3 2.5 - 4.6 mg/dL  Glucose, capillary  Result Value Ref Range   Glucose-Capillary 273 (H) 70 - 99 mg/dL  Basic metabolic panel  Result Value Ref Range   Sodium 155 (H)  135 - 145 mmol/L   Potassium 3.4 (L) 3.5 - 5.1 mmol/L   Chloride 124 (H) 98 - 111 mmol/L   CO2 23 22 - 32 mmol/L   Glucose, Bld 330 (H) 70 - 99 mg/dL   BUN 23 8 - 23 mg/dL   Creatinine, Ser 1.10 (H) 0.44 - 1.00 mg/dL   Calcium 8.3 (L)  8.9 - 10.3 mg/dL   GFR, Estimated 54 (L) >60 mL/min   Anion gap 8 5 - 15  Basic metabolic panel  Result Value Ref Range   Sodium 162 (HH) 135 - 145 mmol/L   Potassium 3.6 3.5 - 5.1 mmol/L   Chloride 130 (H) 98 - 111 mmol/L   CO2 27 22 - 32 mmol/L   Glucose, Bld 112 (H) 70 - 99 mg/dL   BUN 20 8 - 23 mg/dL   Creatinine, Ser 1.01 (H) 0.44 - 1.00 mg/dL   Calcium 8.5 (L) 8.9 - 10.3 mg/dL   GFR, Estimated 60 (L) >60 mL/min   Anion gap 5 5 - 15  Glucose, capillary  Result Value Ref Range   Glucose-Capillary 125 (H) 70 - 99 mg/dL  Magnesium  Result Value Ref Range   Magnesium 2.1 1.7 - 2.4 mg/dL  Phosphorus  Result Value Ref Range   Phosphorus 2.9 2.5 - 4.6 mg/dL  Glucose, capillary  Result Value Ref Range   Glucose-Capillary 84 70 - 99 mg/dL  Glucose, capillary  Result Value Ref Range   Glucose-Capillary 141 (H) 70 - 99 mg/dL  Ammonia  Result Value Ref Range   Ammonia 55 (H) 9 - 35 umol/L  Basic metabolic panel  Result Value Ref Range   Sodium 149 (H) 135 - 145 mmol/L   Potassium 5.4 (H) 3.5 - 5.1 mmol/L   Chloride 121 (H) 98 - 111 mmol/L   CO2 24 22 - 32 mmol/L   Glucose, Bld 191 (H) 70 - 99 mg/dL   BUN 17 8 - 23 mg/dL   Creatinine, Ser 0.75 0.44 - 1.00 mg/dL   Calcium 8.2 (L) 8.9 - 10.3 mg/dL   GFR, Estimated >60 >60 mL/min   Anion gap 4 (L) 5 - 15  CBC  Result Value Ref Range   WBC 9.3 4.0 - 10.5 K/uL   RBC 4.82 3.87 - 5.11 MIL/uL   Hemoglobin 13.6 12.0 - 15.0 g/dL   HCT 43.9 36.0 - 46.0 %   MCV 91.1 80.0 - 100.0 fL   MCH 28.2 26.0 - 34.0 pg   MCHC 31.0 30.0 - 36.0 g/dL   RDW 14.1 11.5 - 15.5 %   Platelets 76 (L) 150 - 400 K/uL   nRBC 0.0 0.0 - 0.2 %  Procalcitonin - Baseline  Result Value Ref Range   Procalcitonin 0.30  ng/mL  Acetaminophen level  Result Value Ref Range   Acetaminophen (Tylenol), Serum <10 (L) 10 - 30 ug/mL  Hepatitis panel, acute  Result Value Ref Range   Hepatitis B Surface Ag NON REACTIVE NON REACTIVE   HCV Ab NON REACTIVE NON REACTIVE   Hep A IgM NON REACTIVE NON REACTIVE   Hep B C IgM NON REACTIVE NON REACTIVE  Glucose, capillary  Result Value Ref Range   Glucose-Capillary 163 (H) 70 - 99 mg/dL  Glucose, capillary  Result Value Ref Range   Glucose-Capillary 142 (H) 70 - 99 mg/dL  Magnesium  Result Value Ref Range   Magnesium 2.2 1.7 - 2.4 mg/dL  Phosphorus  Result Value Ref Range   Phosphorus 3.1 2.5 - 4.6 mg/dL  CBC  Result Value Ref Range   WBC 7.9 4.0 - 10.5 K/uL   RBC 4.20 3.87 - 5.11 MIL/uL   Hemoglobin 11.7 (L) 12.0 - 15.0 g/dL   HCT 37.3 36.0 - 46.0 %   MCV 88.8 80.0 - 100.0 fL   MCH 27.9 26.0 - 34.0 pg   MCHC 31.4  30.0 - 36.0 g/dL   RDW 13.6 11.5 - 15.5 %   Platelets 66 (L) 150 - 400 K/uL   nRBC 0.0 0.0 - 0.2 %  Basic metabolic panel  Result Value Ref Range   Sodium 151 (H) 135 - 145 mmol/L   Potassium 3.8 3.5 - 5.1 mmol/L   Chloride 121 (H) 98 - 111 mmol/L   CO2 26 22 - 32 mmol/L   Glucose, Bld 239 (H) 70 - 99 mg/dL   BUN 15 8 - 23 mg/dL   Creatinine, Ser 0.82 0.44 - 1.00 mg/dL   Calcium 8.8 (L) 8.9 - 10.3 mg/dL   GFR, Estimated >60 >60 mL/min   Anion gap 4 (L) 5 - 15  Procalcitonin  Result Value Ref Range   Procalcitonin 0.81 ng/mL  Glucose, capillary  Result Value Ref Range   Glucose-Capillary 153 (H) 70 - 99 mg/dL  Basic metabolic panel  Result Value Ref Range   Sodium 146 (H) 135 - 145 mmol/L   Potassium 3.7 3.5 - 5.1 mmol/L   Chloride 113 (H) 98 - 111 mmol/L   CO2 23 22 - 32 mmol/L   Glucose, Bld 252 (H) 70 - 99 mg/dL   BUN 15 8 - 23 mg/dL   Creatinine, Ser 0.80 0.44 - 1.00 mg/dL   Calcium 8.7 (L) 8.9 - 10.3 mg/dL   GFR, Estimated >60 >60 mL/min   Anion gap 10 5 - 15  Glucose, capillary  Result Value Ref Range   Glucose-Capillary  246 (H) 70 - 99 mg/dL  Glucose, capillary  Result Value Ref Range   Glucose-Capillary 207 (H) 70 - 99 mg/dL  Hepatic function panel  Result Value Ref Range   Total Protein 5.7 (L) 6.5 - 8.1 g/dL   Albumin 2.3 (L) 3.5 - 5.0 g/dL   AST 116 (H) 15 - 41 U/L   ALT 449 (H) 0 - 44 U/L   Alkaline Phosphatase 130 (H) 38 - 126 U/L   Total Bilirubin 0.4 0.3 - 1.2 mg/dL   Bilirubin, Direct 0.1 0.0 - 0.2 mg/dL   Indirect Bilirubin 0.3 0.3 - 0.9 mg/dL  Glucose, capillary  Result Value Ref Range   Glucose-Capillary 262 (H) 70 - 99 mg/dL  Glucose, capillary  Result Value Ref Range   Glucose-Capillary 254 (H) 70 - 99 mg/dL  Glucose, capillary  Result Value Ref Range   Glucose-Capillary 234 (H) 70 - 99 mg/dL  Basic metabolic panel  Result Value Ref Range   Sodium 140 135 - 145 mmol/L   Potassium 4.0 3.5 - 5.1 mmol/L   Chloride 111 98 - 111 mmol/L   CO2 24 22 - 32 mmol/L   Glucose, Bld 351 (H) 70 - 99 mg/dL   BUN 11 8 - 23 mg/dL   Creatinine, Ser 0.73 0.44 - 1.00 mg/dL   Calcium 8.6 (L) 8.9 - 10.3 mg/dL   GFR, Estimated >60 >60 mL/min   Anion gap 5 5 - 15  Hepatic function panel  Result Value Ref Range   Total Protein 5.3 (L) 6.5 - 8.1 g/dL   Albumin 2.1 (L) 3.5 - 5.0 g/dL   AST 119 (H) 15 - 41 U/L   ALT 314 (H) 0 - 44 U/L   Alkaline Phosphatase 147 (H) 38 - 126 U/L   Total Bilirubin 0.5 0.3 - 1.2 mg/dL   Bilirubin, Direct 0.1 0.0 - 0.2 mg/dL   Indirect Bilirubin 0.4 0.3 - 0.9 mg/dL  Glucose, capillary  Result Value Ref Range  Glucose-Capillary 207 (H) 70 - 99 mg/dL  Glucose, capillary  Result Value Ref Range   Glucose-Capillary 226 (H) 70 - 99 mg/dL  Glucose, capillary  Result Value Ref Range   Glucose-Capillary 292 (H) 70 - 99 mg/dL  Glucose, capillary  Result Value Ref Range   Glucose-Capillary 330 (H) 70 - 99 mg/dL  Glucose, capillary  Result Value Ref Range   Glucose-Capillary 353 (H) 70 - 99 mg/dL  I-Stat venous blood gas, ED ([facility])  Result Value Ref Range   pH,  Ven 7.299 7.25 - 7.43   pCO2, Ven 48.7 44 - 60 mmHg   pO2, Ven 70 (H) 32 - 45 mmHg   Bicarbonate 23.9 20.0 - 28.0 mmol/L   TCO2 25 22 - 32 mmol/L   O2 Saturation 92 %   Acid-base deficit 3.0 (H) 0.0 - 2.0 mmol/L   Sodium 165 (HH) 135 - 145 mmol/L   Potassium 4.4 3.5 - 5.1 mmol/L   Calcium, Ion 1.25 1.15 - 1.40 mmol/L   HCT 49.0 (H) 36.0 - 46.0 %   Hemoglobin 16.7 (H) 12.0 - 15.0 g/dL   Sample type VENOUS    Comment NOTIFIED PHYSICIAN   POC CBG, ED  Result Value Ref Range   Glucose-Capillary >600 (HH) 70 - 99 mg/dL  CBG monitoring, ED  Result Value Ref Range   Glucose-Capillary 595 (HH) 70 - 99 mg/dL   Comment 1 Notify RN   CBG monitoring, ED  Result Value Ref Range   Glucose-Capillary 500 (H) 70 - 99 mg/dL   DG Abd Portable 1V  Result Date: 08/02/2022 CLINICAL DATA:  Feeding tube placement. EXAM: PORTABLE ABDOMEN - 1 VIEW COMPARISON:  August 01, 2022. FINDINGS: Distal tip of feeding tube is seen in expected position of distal stomach. IMPRESSION: Distal tip of feeding tube seen in expected position of distal stomach. Electronically Signed   By: Marijo Conception M.D.   On: 08/02/2022 12:40   DG Abd Portable 1V  Result Date: 08/01/2022 CLINICAL DATA:  NG tube placement EXAM: PORTABLE ABDOMEN - 1 VIEW COMPARISON:  CT done on 07/31/2022 FINDINGS: Tip of NG tube is seen in the region of body of stomach. Bowel gas pattern is nonspecific. No abnormal masses or calcifications are seen. Lower pelvis is not included in the image. IMPRESSION: Tip of NG tube is seen in the stomach. Electronically Signed   By: Elmer Picker M.D.   On: 08/01/2022 12:55   [place] EKG SITE RITE  Result Date: 08/01/2022 If Site Rite image not attached, placement could not be confirmed due to current cardiac rhythm.  CT ABDOMEN PELVIS WO CONTRAST  Result Date: 07/31/2022 CLINICAL DATA:  Fatigue and encephalopathy. Acute liver and kidney injury. History of multiple myeloma. EXAM: CT ABDOMEN AND PELVIS WITHOUT  CONTRAST TECHNIQUE: Multidetector CT imaging of the abdomen and pelvis was performed following the standard protocol without IV contrast. RADIATION DOSE REDUCTION: This exam was performed according to the departmental dose-optimization program which includes automated exposure control, adjustment of the mA and/or kV according to patient size and/or use of iterative reconstruction technique. COMPARISON:  Right upper quadrant ultrasound from same day. Abdominal ultrasound dated September 25, 2010. FINDINGS: Lower chest: No acute abnormality. Mild subsegmental atelectasis at both lung bases. Hepatobiliary: No focal liver abnormality is seen. No gallstones, gallbladder wall thickening, or biliary dilatation. Pancreas: Unremarkable. No pancreatic ductal dilatation or surrounding inflammatory changes. Spleen: Normal in size without focal abnormality. Adrenals/Urinary Tract: Adrenal glands are unremarkable. Kidneys are normal, without  renal calculi, focal lesion, or hydronephrosis. Bladder is unremarkable. Stomach/Bowel: Small hiatal hernia. The stomach is otherwise within normal limits. No bowel wall thickening, distention, or surrounding inflammatory changes. Normal appendix. Vascular/Lymphatic: Aortic atherosclerosis. No enlarged abdominal or pelvic lymph nodes. Reproductive: Uterus and bilateral adnexa are unremarkable. Other: No abdominal wall hernia or abnormality. No abdominopelvic ascites. No pneumoperitoneum. Musculoskeletal: Multiple scattered lucent lesions in the visualized spine and pelvis. No acute fracture. IMPRESSION: 1. No acute intra-abdominal process. 2. Multiple scattered lucent lesions in the visualized spine and pelvis, consistent with history of multiple myeloma. 3.  Aortic Atherosclerosis (ICD10-I70.0). Electronically Signed   By: Titus Dubin M.D.   On: 07/31/2022 13:39   CT HEAD WO CONTRAST (5MM)  Result Date: 07/31/2022 CLINICAL DATA:  Mental status change, persistent or worsening EXAM: CT  HEAD WITHOUT CONTRAST TECHNIQUE: Contiguous axial images were obtained from the base of the skull through the vertex without intravenous contrast. RADIATION DOSE REDUCTION: This exam was performed according to the departmental dose-optimization program which includes automated exposure control, adjustment of the mA and/or kV according to patient size and/or use of iterative reconstruction technique. COMPARISON:  CT head 04/16/2021. FINDINGS: Brain: No evidence of acute infarction, hemorrhage, hydrocephalus, extra-axial collection or mass lesion/mass effect. Vascular: Calcific atherosclerosis. Skull: No acute fracture.  Hyperostosis frontalis. Sinuses/Orbits: Clear visualized sinuses. No acute orbital findings. Other: Cerumen in bilateral EACs.  No mastoid effusions. IMPRESSION: No evidence of acute intracranial abnormality. Electronically Signed   By: Margaretha Sheffield M.D.   On: 07/31/2022 13:21   US Abdomen Limited RUQ (LIVER/GB)  Result Date: 07/31/2022 CLINICAL DATA:  Elevated ALT level. EXAM: ULTRASOUND ABDOMEN LIMITED RIGHT UPPER QUADRANT COMPARISON:  September 25, 2010. FINDINGS: Gallbladder: There is the suggestion of a large stone or tumefactive sludge within the gallbladder lumen. No gallbladder wall thickening or pericholecystic fluid is noted. No sonographic Murphy's sign is noted. Common bile duct: Diameter: 4 mm which is within normal limits. Liver: No focal lesion identified. Increased echogenicity of hepatic parenchyma is noted suggesting hepatic steatosis. Portal vein is patent on color Doppler imaging with normal direction of blood flow towards the liver. Other: None. IMPRESSION: There is either a large gallstone or tumefactive sludge seen within the gallbladder lumen. No definite gallbladder wall thickening or sonographic Murphy's sign is noted. Probable hepatic steatosis. Electronically Signed   By: Marijo Conception M.D.   On: 07/31/2022 11:14   DG Chest Port 1 View  Result Date:  07/31/2022 CLINICAL DATA:  Pt exam order states amsQuestionable sepsis - evaluate for abnormality EXAM: PORTABLE CHEST 1 VIEW COMPARISON:  None Available. FINDINGS: Normal mediastinum and cardiac silhouette. Normal pulmonary vasculature. No evidence of effusion, infiltrate, or pneumothorax. No acute bony abnormality. IMPRESSION: No acute cardiopulmonary process. Electronically Signed   By: Suzy Bouchard M.D.   On: 07/31/2022 09:05    EKG None  Radiology No results found.  Procedures Procedures    Medications Ordered in ED Medications  sodium chloride 0.9 % bolus 1,000 mL (has no administration in time range)    ED Course/ Medical Decision Making/ A&P                           Medical Decision Making Problems Addressed: Poor appetite: acute illness or injury Severe dementia, unspecified dementia type, unspecified whether behavioral, psychotic, or mood disturbance or anxiety ([place]): chronic illness or injury with exacerbation, progression, or side effects of treatment that poses a threat to life or  bodily functions  Amount and/or Complexity of Data Reviewed Independent Historian: EMS    Details: hx External Data Reviewed: labs, radiology and notes. Labs: ordered. Decision-making details documented in ED Course. Radiology: independent interpretation performed. Decision-making details documented in ED Course.  Risk Decision regarding hospitalization.   Iv ns. Continuous pulse ox and cardiac monitoring. Labs ordered/sent. Imaging ordered.   Diff dx includes hypernatremia, dehydration, uti, etc - dispo decision including potential need for admission considered - will get labs and reassess.   Reviewed nursing notes and prior charts for additional history. External reports reviewed. Additional history from: EMS.   Cardiac monitor: sinus rhythm, rate 98.  Labs reviewed/interpreted by me - pnd.  Recent ct reviewed/interpreted by - no hem.   Ns bolus.   2305, labs pending -  signed out to Dr Roxanne Mins to check labs, and dispo appropriately.            Final Clinical Impression(s) / ED Diagnoses Final diagnoses:  None    Rx / DC Orders ED Discharge Orders     None         Lajean Saver, MD 08/06/22 2308

## 2022-08-06 NOTE — Telephone Encounter (Signed)
Medication: linagliptin (TRADJENTA) 5 MG TABS tablet   Has the patient contacted their pharmacy? No.  Preferred Pharmacy (with phone number or street name):  [id number] [address] [place] [address]Phone: [phone] Fax: [phone]

## 2022-08-06 NOTE — Telephone Encounter (Signed)
Looks like medication was Rx at the hospital.

## 2022-08-07 ENCOUNTER — Other Ambulatory Visit: Payer: Self-pay

## 2022-08-07 ENCOUNTER — Encounter (HOSPITAL_COMMUNITY): Payer: Self-pay | Admitting: Internal Medicine

## 2022-08-07 DIAGNOSIS — E860 Dehydration: Secondary | ICD-10-CM

## 2022-08-07 DIAGNOSIS — F03C0 Unspecified dementia, severe, without behavioral disturbance, psychotic disturbance, mood disturbance, and anxiety: Secondary | ICD-10-CM | POA: Diagnosis not present

## 2022-08-07 DIAGNOSIS — E119 Type 2 diabetes mellitus without complications: Secondary | ICD-10-CM | POA: Diagnosis not present

## 2022-08-07 DIAGNOSIS — K7210 Chronic hepatic failure without coma: Secondary | ICD-10-CM | POA: Insufficient documentation

## 2022-08-07 DIAGNOSIS — R630 Anorexia: Secondary | ICD-10-CM

## 2022-08-07 DIAGNOSIS — I10 Essential (primary) hypertension: Secondary | ICD-10-CM | POA: Diagnosis not present

## 2022-08-07 DIAGNOSIS — R7989 Other specified abnormal findings of blood chemistry: Secondary | ICD-10-CM

## 2022-08-07 DIAGNOSIS — D696 Thrombocytopenia, unspecified: Secondary | ICD-10-CM

## 2022-08-07 DIAGNOSIS — R748 Abnormal levels of other serum enzymes: Secondary | ICD-10-CM

## 2022-08-07 DIAGNOSIS — E440 Moderate protein-calorie malnutrition: Secondary | ICD-10-CM

## 2022-08-07 DIAGNOSIS — D649 Anemia, unspecified: Secondary | ICD-10-CM

## 2022-08-07 LAB — RETICULOCYTES
Immature Retic Fract: 29.6 % — ABNORMAL HIGH (ref 2.3–15.9)
RBC.: 4.17 MIL/uL (ref 3.87–5.11)
Retic Count, Absolute: 66.3 10*3/uL (ref 19.0–186.0)
Retic Ct Pct: 1.6 % (ref 0.4–3.1)

## 2022-08-07 LAB — AMMONIA: Ammonia: 30 umol/L (ref 9–35)

## 2022-08-07 LAB — VITAMIN B12: Vitamin B-12: 1336 pg/mL — ABNORMAL HIGH (ref 180–914)

## 2022-08-07 LAB — URINALYSIS, ROUTINE W REFLEX MICROSCOPIC
Bacteria, UA: NONE SEEN
Bilirubin Urine: NEGATIVE
Glucose, UA: >=500 mg/dL — AB
Hgb urine dipstick: NEGATIVE
Ketones, ur: NEGATIVE mg/dL
Leukocytes,Ua: NEGATIVE
Nitrite: NEGATIVE
Protein, ur: NEGATIVE mg/dL
Specific Gravity, Urine: 1.022 (ref 1.005–1.030)
pH: 5 (ref 5.0–8.0)

## 2022-08-07 LAB — CBC WITH DIFFERENTIAL/PLATELET
Abs Immature Granulocytes: 0.02 10*3/uL (ref 0.00–0.07)
Basophils Absolute: 0 10*3/uL (ref 0.0–0.1)
Basophils Relative: 0 %
Eosinophils Absolute: 0 10*3/uL (ref 0.0–0.5)
Eosinophils Relative: 1 %
HCT: 25.2 % — ABNORMAL LOW (ref 36.0–46.0)
Hemoglobin: 8.2 g/dL — ABNORMAL LOW (ref 12.0–15.0)
Immature Granulocytes: 1 %
Lymphocytes Relative: 44 %
Lymphs Abs: 1.9 10*3/uL (ref 0.7–4.0)
MCH: 28.8 pg (ref 26.0–34.0)
MCHC: 32.5 g/dL (ref 30.0–36.0)
MCV: 88.4 fL (ref 80.0–100.0)
Monocytes Absolute: 0.5 10*3/uL (ref 0.1–1.0)
Monocytes Relative: 11 %
Neutro Abs: 1.9 10*3/uL (ref 1.7–7.7)
Neutrophils Relative %: 43 %
Platelets: 70 10*3/uL — ABNORMAL LOW (ref 150–400)
RBC: 2.85 MIL/uL — ABNORMAL LOW (ref 3.87–5.11)
RDW: 13.3 % (ref 11.5–15.5)
WBC: 4.3 10*3/uL (ref 4.0–10.5)
nRBC: 0.5 % — ABNORMAL HIGH (ref 0.0–0.2)

## 2022-08-07 LAB — CBG MONITORING, ED
Glucose-Capillary: 227 mg/dL — ABNORMAL HIGH (ref 70–99)
Glucose-Capillary: 212 mg/dL — ABNORMAL HIGH (ref 70–99)
Glucose-Capillary: 166 mg/dL — ABNORMAL HIGH (ref 70–99)

## 2022-08-07 LAB — GLUCOSE, CAPILLARY
Glucose-Capillary: 146 mg/dL — ABNORMAL HIGH (ref 70–99)
Glucose-Capillary: 155 mg/dL — ABNORMAL HIGH (ref 70–99)

## 2022-08-07 LAB — IRON AND TIBC
Iron: 41 ug/dL (ref 28–170)
Saturation Ratios: 17 % (ref 10.4–31.8)
TIBC: 242 ug/dL — ABNORMAL LOW (ref 250–450)
UIBC: 201 ug/dL

## 2022-08-07 LAB — HEMOGLOBIN AND HEMATOCRIT, BLOOD
HCT: 36.7 % (ref 36.0–46.0)
Hemoglobin: 11.9 g/dL — ABNORMAL LOW (ref 12.0–15.0)

## 2022-08-07 LAB — FERRITIN: Ferritin: 182 ng/mL (ref 11–307)

## 2022-08-07 LAB — LACTATE DEHYDROGENASE: LDH: 216 U/L — ABNORMAL HIGH (ref 98–192)

## 2022-08-07 LAB — FOLATE: Folate: 19.4 ng/mL (ref >5.9)

## 2022-08-07 LAB — PROTIME-INR
INR: 1.1 (ref 0.8–1.2)
Prothrombin Time: 13.9 seconds (ref 11.4–15.2)

## 2022-08-07 MED ORDER — RIVASTIGMINE TARTRATE 1.5 MG PO CAPS: 1.5000 mg | ORAL_CAPSULE | Freq: Two times a day (BID) | ORAL | Status: DC

## 2022-08-07 MED ORDER — INSULIN ASPART 100 UNIT/ML IJ SOLN
0.0000 [IU] | Freq: Every day | INTRAMUSCULAR | Status: DC
  Administered 2022-08-08: 3 [IU] via SUBCUTANEOUS

## 2022-08-07 MED ORDER — MIRTAZAPINE 15 MG PO TABS
30.0000 mg | ORAL_TABLET | Freq: Every day | ORAL | Status: DC
  Administered 2022-08-07: 30 mg via ORAL
  Filled 2022-08-07: qty 2

## 2022-08-07 MED ORDER — MIRTAZAPINE 15 MG PO TABS
30.0000 mg | ORAL_TABLET | Freq: Every day | ORAL | Status: DC
  Filled 2022-08-07: qty 2

## 2022-08-07 MED ORDER — PANTOPRAZOLE SODIUM 40 MG PO TBEC
40.0000 mg | DELAYED_RELEASE_TABLET | Freq: Every day | ORAL | Status: DC
  Administered 2022-08-07: 40 mg via ORAL
  Filled 2022-08-07 (×2): qty 1

## 2022-08-07 MED ORDER — ACETAMINOPHEN 650 MG RE SUPP: 650.0000 mg | Freq: Four times a day (QID) | RECTAL | Status: DC | PRN

## 2022-08-07 MED ORDER — INSULIN GLARGINE-YFGN 100 UNIT/ML ~~LOC~~ SOLN
5.0000 [IU] | Freq: Every day | SUBCUTANEOUS | Status: DC
  Filled 2022-08-07: qty 0.05

## 2022-08-07 MED ORDER — ONDANSETRON HCL 4 MG PO TABS: 4.0000 mg | ORAL_TABLET | Freq: Four times a day (QID) | ORAL | Status: DC | PRN

## 2022-08-07 MED ORDER — ONDANSETRON HCL 4 MG/2ML IJ SOLN: 4.0000 mg | Freq: Four times a day (QID) | INTRAMUSCULAR | Status: DC | PRN

## 2022-08-07 MED ORDER — QUETIAPINE FUMARATE 25 MG PO TABS
25.0000 mg | ORAL_TABLET | Freq: Every day | ORAL | Status: DC
  Administered 2022-08-07 – 2022-08-09 (×3): 25 mg via ORAL
  Filled 2022-08-07 (×3): qty 1

## 2022-08-07 MED ORDER — ACETAMINOPHEN 325 MG PO TABS: 650.0000 mg | ORAL_TABLET | Freq: Four times a day (QID) | ORAL | Status: DC | PRN

## 2022-08-07 MED ORDER — INFLUENZA VAC A&B SA ADJ QUAD 0.5 ML IM PRSY: 0.5000 mL | PREFILLED_SYRINGE | INTRAMUSCULAR | Status: DC | PRN

## 2022-08-07 MED ORDER — ENSURE ENLIVE PO LIQD
237.0000 mL | Freq: Two times a day (BID) | ORAL | Status: DC
  Administered 2022-08-07 – 2022-08-09 (×3): 237 mL via ORAL
  Filled 2022-08-07: qty 237

## 2022-08-07 MED ORDER — SENNOSIDES-DOCUSATE SODIUM 8.6-50 MG PO TABS: 1.0000 | ORAL_TABLET | Freq: Every evening | ORAL | Status: DC | PRN

## 2022-08-07 MED ORDER — INSULIN ASPART 100 UNIT/ML IJ SOLN
0.0000 [IU] | INTRAMUSCULAR | Status: DC
  Administered 2022-08-07: 5 [IU] via SUBCUTANEOUS

## 2022-08-07 MED ORDER — INSULIN ASPART 100 UNIT/ML IJ SOLN
0.0000 [IU] | Freq: Three times a day (TID) | INTRAMUSCULAR | Status: DC
  Administered 2022-08-07: 2 [IU] via SUBCUTANEOUS
  Administered 2022-08-08 (×2): 3 [IU] via SUBCUTANEOUS
  Administered 2022-08-09: 5 [IU] via SUBCUTANEOUS
  Administered 2022-08-09 (×2): 8 [IU] via SUBCUTANEOUS
  Administered 2022-08-10: 3 [IU] via SUBCUTANEOUS
  Administered 2022-08-10: 11 [IU] via SUBCUTANEOUS

## 2022-08-07 MED ORDER — POTASSIUM CHLORIDE IN NACL 20-0.9 MEQ/L-% IV SOLN
INTRAVENOUS | Status: DC
  Filled 2022-08-07: qty 1000

## 2022-08-07 NOTE — ED Notes (Signed)
Pt asleep, resting comfortably. Appears in NAD, respirations even, unlabored.

## 2022-08-07 NOTE — ED Notes (Signed)
Edp at bedside °

## 2022-08-07 NOTE — ED Notes (Signed)
ED TO INPATIENT HANDOFF REPORT  ED Nurse Name and Phone #: Jeannie Done [phone]   S Name/Age/Gender Victoria Gates 71 y.o. female Room/Bed: H011C/H011C  Code Status   Code Status: Full Code  Home/SNF/Other Home Patient oriented to: self (hx dementia) Is this baseline? Yes   Triage Complete: Triage complete  Chief Complaint Anemia [D64.9]  Triage Note Pt BIB [facility] from home after family had concerns about pt not eating and drinking anything today. Per family, pt ate like normal yesterday. Dementia at baseline, ambulatory.  VSS, CBG 296 HX- DM   Allergies Allergies  Allergen Reactions   Atorvastatin     REACTION: myalgias   Rosuvastatin     REACTION: myalgias   Sulfonamide Derivatives     Unknown reaction     Level of Care/Admitting Diagnosis ED Disposition     ED Disposition  Admit   Condition  --   Comment  Hospital Area: [facility] [[id number]]  Level of Care: Med-Surg [[id number]]  May admit patient to [facility] or [facility] if equivalent level of care is available:: Yes  Covid Evaluation: Asymptomatic - no recent exposure (last 10 days) testing not required  Diagnosis: Anemia [883254]  Admitting Physician: Kathie Dike [[id number]]  Attending Physician: Kathie Dike [[id number]]  Certification:: I certify this patient will need inpatient services for at least 2 midnights  Estimated Length of Stay: 2          B Medical/Surgery History Past Medical History:  Diagnosis Date   Allergic rhinitis 01/02/2009   Bradycardia 04/29/2016   Cervical Radiculopathy 02/01/2010   Left side   Chest pain 04/15/2012   Ex MV: Ex time 10 mins, EF 63%, no ischemia/infarct. Occas PAC's/PVC's.   Diabetes mellitus, type II 03/18/2008   Encounter for antineoplastic chemotherapy 04/29/2016   Esophageal Stricture 04/15/2007   s/p dilitation   GERD (gastroesophageal reflux disease) 09/21/2010   Herpes zoster 11/05/2010   Hiatal Hernia 04/15/2007   History of colonic  polyps 04/24/2007   Hyperplastic only   Hypercholesterolemia 07/03/2009   Hypokalemia 11/30/2015   Iron deficiency anemia 04/24/2007   Multiple myeloma ([place]) 02/2014   Osteoporosis 04/24/2007   Past Surgical History:  Procedure Laterality Date   ESOPHAGOGASTRODUODENOSCOPY  04/15/2007   TUBAL LIGATION       A IV Location/Drains/Wounds Patient Lines/Drains/Airways Status     Active Line/Drains/Airways     Name Placement date Placement time Site Days   Peripheral IV 08/06/22 20 G Right Antecubital 08/06/22  2250  Antecubital  1            Intake/Output Last 24 hours No intake or output data in the 24 hours ending 08/07/22 1642  Labs/Imaging Results for orders placed or performed during the hospital encounter of 08/06/22 (from the past 48 hour(s))  Comprehensive metabolic panel     Status: Abnormal   Collection Time: 08/06/22 10:42 PM  Result Value Ref Range   Sodium 141 135 - 145 mmol/L   Potassium 3.7 3.5 - 5.1 mmol/L   Chloride 108 98 - 111 mmol/L   CO2 21 (L) 22 - 32 mmol/L   Glucose, Bld 247 (H) 70 - 99 mg/dL    Comment: Glucose reference range applies only to samples taken after fasting for at least 8 hours.   BUN 11 8 - 23 mg/dL   Creatinine, Ser 0.83 0.44 - 1.00 mg/dL   Calcium 9.3 8.9 - 10.3 mg/dL   Total Protein 7.2 6.5 - 8.1 g/dL  Albumin 3.0 (L) 3.5 - 5.0 g/dL   AST 77 (H) 15 - 41 U/L   ALT 194 (H) 0 - 44 U/L   Alkaline Phosphatase 137 (H) 38 - 126 U/L   Total Bilirubin 0.7 0.3 - 1.2 mg/dL   GFR, Estimated >60 >60 mL/min    Comment: (NOTE) Calculated using the CKD-EPI Creatinine Equation (2021)    Anion gap 12 5 - 15    Comment: Performed at [facility] [address] [place], [place] [address]  Lipase, blood     Status: Abnormal   Collection Time: 08/06/22 10:42 PM  Result Value Ref Range   Lipase 52 (H) 11 - 51 U/L    Comment: Performed at [facility] [address] [place], [place] [address]  CBC with Differential     Status:  Abnormal   Collection Time: 08/07/22  1:13 AM  Result Value Ref Range   WBC 4.3 4.0 - 10.5 K/uL   RBC 2.85 (L) 3.87 - 5.11 MIL/uL   Hemoglobin 8.2 (L) 12.0 - 15.0 g/dL   HCT 25.2 (L) 36.0 - 46.0 %   MCV 88.4 80.0 - 100.0 fL   MCH 28.8 26.0 - 34.0 pg   MCHC 32.5 30.0 - 36.0 g/dL   RDW 13.3 11.5 - 15.5 %   Platelets 70 (L) 150 - 400 K/uL    Comment: Immature Platelet Fraction may be clinically indicated, consider ordering this additional test [id number] REPEATED TO VERIFY    nRBC 0.5 (H) 0.0 - 0.2 %   Neutrophils Relative % 43 %   Neutro Abs 1.9 1.7 - 7.7 K/uL   Lymphocytes Relative 44 %   Lymphs Abs 1.9 0.7 - 4.0 K/uL   Monocytes Relative 11 %   Monocytes Absolute 0.5 0.1 - 1.0 K/uL   Eosinophils Relative 1 %   Eosinophils Absolute 0.0 0.0 - 0.5 K/uL   Basophils Relative 0 %   Basophils Absolute 0.0 0.0 - 0.1 K/uL   Immature Granulocytes 1 %   Abs Immature Granulocytes 0.02 0.00 - 0.07 K/uL    Comment: Performed at [facility], [place] [address] [place], [place] [address]  Urinalysis, Routine w reflex microscopic Urine, Catheterized     Status: Abnormal   Collection Time: 08/07/22  1:49 AM  Result Value Ref Range   Color, Urine YELLOW YELLOW   APPearance CLEAR CLEAR   Specific Gravity, Urine 1.022 1.005 - 1.030   pH 5.0 5.0 - 8.0   Glucose, UA >=500 (A) NEGATIVE mg/dL   Hgb urine dipstick NEGATIVE NEGATIVE   Bilirubin Urine NEGATIVE NEGATIVE   Ketones, ur NEGATIVE NEGATIVE mg/dL   Protein, ur NEGATIVE NEGATIVE mg/dL   Nitrite NEGATIVE NEGATIVE   Leukocytes,Ua NEGATIVE NEGATIVE   RBC / HPF 0-5 0 - 5 RBC/hpf   WBC, UA 0-5 0 - 5 WBC/hpf   Bacteria, UA NONE SEEN NONE SEEN   Squamous Epithelial / LPF 0-5 0 - 5   Mucus PRESENT     Comment: Performed at [facility], [place] [address] [place], [place] [address]  CBG monitoring, ED     Status: Abnormal   Collection Time: 08/07/22  6:08 AM  Result Value Ref Range   Glucose-Capillary 227 (H) 70 - 99 mg/dL     Comment: Glucose reference range applies only to samples taken after fasting for at least 8 hours.  Protime-INR     Status: None   Collection Time: 08/07/22  7:30  AM  Result Value Ref Range   Prothrombin Time 13.9 11.4 - 15.2 seconds   INR 1.1 0.8 - 1.2    Comment: (NOTE) INR goal varies based on device and disease states. Performed at [facility], [place] [address] [place], [place] [address]   Ammonia     Status: None   Collection Time: 08/07/22  7:30 AM  Result Value Ref Range   Ammonia 30 9 - 35 umol/L    Comment: Performed at [facility], [place] [address], [place], [place] [address]  Iron and TIBC     Status: Abnormal   Collection Time: 08/07/22  7:30 AM  Result Value Ref Range   Iron 41 28 - 170 ug/dL   TIBC 242 (L) 250 - 450 ug/dL   Saturation Ratios 17 10.4 - 31.8 %   UIBC 201 ug/dL    Comment: Performed at [facility], [place] [address] [place], [place] [address]  Ferritin     Status: None   Collection Time: 08/07/22  7:30 AM  Result Value Ref Range   Ferritin 182 11 - 307 ng/mL    Comment: Performed at [facility], [place] [address] [place], [place] [address]  Reticulocytes     Status: Abnormal   Collection Time: 08/07/22  7:30 AM  Result Value Ref Range   Retic Ct Pct 1.6 0.4 - 3.1 %   RBC. 4.17 3.87 - 5.11 MIL/uL   Retic Count, Absolute 66.3 19.0 - 186.0 K/uL   Immature Retic Fract 29.6 (H) 2.3 - 15.9 %    Comment: Performed at [facility] [address], [place], [place] [address]  Vitamin B12     Status: Abnormal   Collection Time: 08/07/22  7:30 AM  Result Value Ref Range   Vitamin B-12 1,336 (H) 180 - 914 pg/mL    Comment: (NOTE) This assay is not validated for testing neonatal or myeloproliferative syndrome specimens for Vitamin B12 levels. Performed at [facility], [place] [address] [place], [place] [address]   Folate     Status: None   Collection Time: 08/07/22  7:30 AM  Result Value Ref Range   Folate 19.4 >5.9  ng/mL    Comment: Performed at [facility] [address], [place], [place] [address]  Hemoglobin and hematocrit, blood     Status: Abnormal   Collection Time: 08/07/22  7:30 AM  Result Value Ref Range   Hemoglobin 11.9 (L) 12.0 - 15.0 g/dL    Comment: REPEATED TO VERIFY   HCT 36.7 36.0 - 46.0 %    Comment: Performed at [facility] [address] [place], [place] [address]  CBG monitoring, ED     Status: Abnormal   Collection Time: 08/07/22  7:53 AM  Result Value Ref Range   Glucose-Capillary 212 (H) 70 - 99 mg/dL    Comment: Glucose reference range applies only to samples taken after fasting for at least 8 hours.  CBG monitoring, ED     Status: Abnormal   Collection Time: 08/07/22 11:57 AM  Result Value Ref Range   Glucose-Capillary 166 (H) 70 - 99 mg/dL    Comment: Glucose reference range applies only to samples taken after fasting for at least 8 hours.   No results found.  Pending Labs Unresulted Labs (From admission, onward)     Start     Ordered   08/08/22 0500  Comprehensive metabolic panel  Tomorrow morning,   R        08/07/22 0517   08/08/22 0500  CBC  Tomorrow morning,   R        08/07/22 0517   08/08/22 0500  Lipase, blood  Tomorrow morning,   R        08/07/22 0524   08/08/22 0500  Comprehensive metabolic panel  Tomorrow morning,   R        08/07/22 1209   08/07/22 1600  CBC  Once,   R        08/07/22 1209   08/07/22 1600  Lactate dehydrogenase  Once,   R        08/07/22 1209   08/07/22 0553  Hemoglobin and hematocrit, blood  Now then every 12 hours,   R      08/07/22 0552            Vitals/Pain Today's Vitals   08/07/22 0000 08/07/22 0612 08/07/22 0915 08/07/22 1259  BP:   108/88   Pulse:   76   Resp:   16   Temp: 97.9 F (36.6 C) 97.6 F (36.4 C) 97.7 F (36.5 C) 97.8 F (36.6 C)  TempSrc: Oral Oral Oral Oral  SpO2:   100%     Isolation Precautions No active isolations  Medications Medications  senna-docusate  (Senokot-S) tablet 1 tablet (has no administration in time range)  ondansetron (ZOFRAN) tablet 4 mg (has no administration in time range)    Or  ondansetron (ZOFRAN) injection 4 mg (has no administration in time range)  pantoprazole (PROTONIX) EC tablet 40 mg (40 mg Oral Given 08/07/22 0957)  feeding supplement (ENSURE ENLIVE / ENSURE PLUS) liquid 237 mL (237 mLs Oral Patient Refused/Not Given 08/07/22 1442)  QUEtiapine (SEROQUEL) tablet 25 mg (has no administration in time range)  mirtazapine (REMERON SOL-TAB) disintegrating tablet 30 mg (has no administration in time range)  insulin aspart (novoLOG) injection 0-15 Units (has no administration in time range)  insulin aspart (novoLOG) injection 0-5 Units (has no administration in time range)  sodium chloride 0.9 % bolus 1,000 mL (0 mLs Intravenous Stopped 08/07/22 0000)    Mobility walks with person assist     Focused Assessments Neuro Assessment Handoff:  Swallow screen pass? Yes          Neuro Assessment:   Neuro Checks:      Last Documented NIHSS Modified Score:   Has TPA been given? No If patient is a Neuro Trauma and patient is going to OR before floor call report to [place] nurse: [phone] or [phone]   R Recommendations: See Admitting Provider Note  Report given to:   Additional Notes: dementia at baseline, decreased PO intake

## 2022-08-07 NOTE — H&P (Addendum)
PCP:   Ann Held, DO   Chief Complaint:  Anemia  HPI: This is a 71 year old female with history of DM type II, GERD, multiple myeloma in remission, hyperlipidemia, iron deficiency anemia, frontotemporal dementia and esophageal stricture.  She had a recent admission 11/1 to 11/5 DKA, hypernatremia and altered mentation.  At that point poor oral intake was noted to be a problem due to her baseline dementia.  Yesterday the patient stopped eating completely, her family brought her to the ER.  In the ER work-up was unremarkable except for hemoglobin of 8.2, down from 11.7 and the patient with platelets of 70.  Patient's hemoglobin has been downtrending since 07/31/2022.  Occult stool has not been done as patient in the hallway with no bed availability.  Hospitalist have been asked to admit.  History provided solely by the ER physician.  Patient is unable to provide history.  Review of Systems:  Unable to obtain due to patient's dementia  Past Medical History: Past Medical History:  Diagnosis Date   Allergic rhinitis 01/02/2009   Bradycardia 04/29/2016   Cervical Radiculopathy 02/01/2010   Left side   Chest pain 04/15/2012   Ex MV: Ex time 10 mins, EF 63%, no ischemia/infarct. Occas PAC's/PVC's.   Diabetes mellitus, type II 03/18/2008   Encounter for antineoplastic chemotherapy 04/29/2016   Esophageal Stricture 04/15/2007   s/p dilitation   GERD (gastroesophageal reflux disease) 09/21/2010   Herpes zoster 11/05/2010   Hiatal Hernia 04/15/2007   History of colonic polyps 04/24/2007   Hyperplastic only   Hypercholesterolemia 07/03/2009   Hypokalemia 11/30/2015   Iron deficiency anemia 04/24/2007   Multiple myeloma ([place]) 02/2014   Osteoporosis 04/24/2007   Past Surgical History:  Procedure Laterality Date   ESOPHAGOGASTRODUODENOSCOPY  04/15/2007   TUBAL LIGATION      Medications: Prior to Admission medications   Medication Sig Start Date End Date Taking? Authorizing  Provider  blood glucose meter kit and supplies KIT Dispense based on patient and insurance preference. Use daily to check blood sugar. 08/04/22   Dessa Phi, DO  docusate sodium (COLACE) 100 MG capsule Take 200 mg by mouth daily.    [provider]  feeding supplement, ENSURE COMPLETE, (ENSURE COMPLETE) LIQD Take 237 mLs by mouth 3 (three) times daily between meals.    [provider]  lansoprazole (PREVACID) 30 MG capsule Take 30 mg by mouth daily with breakfast.     [provider]  linagliptin (TRADJENTA) 5 MG TABS tablet Take 1 tablet (5 mg total) by mouth daily. Pt needs to follow up with Endocrinology 08/06/22   Carollee Herter, Alferd Apa, DO  metFORMIN (GLUCOPHAGE) 500 MG tablet Take 1 tablet (500 mg total) by mouth 2 (two) times daily with a meal. 08/04/22   Dessa Phi, DO  mirtazapine (REMERON) 30 MG tablet Take 1 tablet (30 mg total) by mouth at bedtime. 04/16/22   Ann Held, DO  Multiple Vitamin (MULTIVITAMIN WITH MINERALS) TABS tablet Take 1 tablet by mouth daily. 08/05/22   Dessa Phi, DO  Pitavastatin Calcium 4 MG TABS Take 1 tablet (4 mg total) by mouth daily. Patient not taking: Reported on 08/05/2022 05/03/22   Carollee Herter, Alferd Apa, DO  rivastigmine (EXELON) 1.5 MG capsule Take 1 capsule (1.5 mg total) by mouth 2 (two) times daily. 05/15/22   Narda Amber K, DO    Allergies:   Allergies  Allergen Reactions   Atorvastatin     REACTION: myalgias   Rosuvastatin  REACTION: myalgias   Sulfonamide Derivatives     Unknown reaction     Social History:  reports that she has never smoked. She has never used smokeless tobacco. She reports that she does not drink alcohol and does not use drugs.  Family History: Family History  Problem Relation Age of Onset   Brain cancer Mother    Heart disease Mother    Lung cancer Father    Heart disease Father    Diabetes Sister    Heart disease Sister    Heart attack Sister    ALS Sister     Diabetes Brother    Heart disease Brother    Cancer Neg Hx        No FH of Colon Cancer   Colon cancer Neg Hx    Esophageal cancer Neg Hx    Rectal cancer Neg Hx    Stomach cancer Neg Hx     Physical Exam: Vitals:   08/06/22 2218 08/06/22 2221 08/07/22 0000  BP:  (!) 107/95   Pulse:  99   Resp:  17   Temp:   97.9 F (36.6 C)  TempSrc:   Oral  SpO2: 97% 99%     General: Demented, awake patient Eyes: PERRLA, pink conjunctiva, no scleral icterus ENT: Dry oral mucosa, neck supple, no thyromegaly Lungs: clear to ascultation, no wheeze, no crackles, no use of accessory muscles Cardiovascular: regular rate and rhythm, no regurgitation, no gallops, no murmurs. No carotid bruits, no JVD Abdomen: soft, positive BS, non-tender, non-distended, no organomegaly, not an acute abdomen GU: not examined Neuro: CN II - XII grossly intact Musculoskeletal: strength 5/5 all extremities, no clubbing, cyanosis or edema Skin: no rash, no subcutaneous crepitation, no decubitus Psych: Demented patient   Labs on Admission:  Recent Labs    08/04/22 0500 08/06/22 2242  NA 140 141  K 4.0 3.7  CL 111 108  CO2 24 21*  GLUCOSE 351* 247*  BUN 11 11  CREATININE 0.73 0.83  CALCIUM 8.6* 9.3   Recent Labs    08/04/22 0500 08/06/22 2242  AST 119* 77*  ALT 314* 194*  ALKPHOS 147* 137*  BILITOT 0.5 0.7  PROT 5.3* 7.2  ALBUMIN 2.1* 3.0*   Recent Labs    08/06/22 2242  LIPASE 52*   Recent Labs    08/07/22 0113  WBC 4.3  NEUTROABS 1.9  HGB 8.2*  HCT 25.2*  MCV 88.4  PLT 70*    Micro Results: Recent Results (from the past 240 hour(s))  Blood Culture (routine x 2)     Status: None   Collection Time: 07/31/22  8:49 AM   Specimen: BLOOD  Result Value Ref Range Status   Specimen Description BLOOD LEFT ANTECUBITAL  Final   Special Requests   Final    BOTTLES DRAWN AEROBIC AND ANAEROBIC Blood Culture adequate volume   Culture   Final    NO GROWTH 5 DAYS Performed at [facility], [place] [address], [place], [place] [address]    Report Status 08/05/2022 FINAL  Final  Urine Culture     Status: None   Collection Time: 07/31/22  8:49 AM   Specimen: In/Out Cath Urine  Result Value Ref Range Status   Specimen Description IN/OUT CATH URINE  Final   Special Requests NONE  Final   Culture   Final    NO GROWTH Performed at [facility], [place] [address], [place], [place] [address]    Report  Status 08/01/2022 FINAL  Final     Radiological Exams on Admission: No results found.  Assessment/Plan Present on Admission: Anemia in setting of patient with thrombocytopenia -Bring in for overnight observation -Serial H&H -Add ammonia and PT/INR levels -Thrombocytopenia relatively new since 07/31/2022 -Transfuse if needed  Chronic liver disease -LFTs downtrending but patient with recent new thrombocytopenia. -Work-up last admission included RUQ [place] which showed large gallstone, sludge without gallbladder wall thickening. Her hepatitis panel and acetaminophen level were negative -Defer to a.m. team GI vs surgery consult and further work-up -Will discontinue Exelon patch as this can cause hepatobiliary disorders with abnormal LFTs. -Benign abdominal exam.  Patient has not been made NPO.  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

## 2022-08-07 NOTE — Progress Notes (Signed)
Patient admitted to the hospital earlier this morning by Dr. Claria Dice  Patient seen and examined.  She is sitting up in bed.  She appears to be comfortable.  She is confused.  Her husband is at bedside.  He reports that she did eat some applesauce today.  Husband reports that she initially did well with p.o. intake when first returning home, but did not have any p.o. intake yesterday.  She did have few episodes of loose stools since her discharge.  Stools were reported to be normal in color without melena or hematochezia.  She was recently started on metformin and Tradjenta, although they have not been able to pick up the [place] as of yet.  Assessment/plan  Anemia -Patient found to have hemoglobin of 8.2 on arrival to the emergency room today. -On last discharge, hemoglobin was 11.7 -No reported bleeding since last discharge -H&H was repeated this morning and hemoglobin noted to be 11.9 -Unclear if initial hemoglobin on arrival was drawn in error -We will repeat CBC later this afternoon -B12/folate are not low -Iron/ferritin did not indicate severe iron deficiency  Thrombocytopenia -Present on last admission -Suspect that this may have been related to her acute illness -Overall platelet count does appear to be stable since last discharge -Check LDH on next blood draw  Elevated LFTs -Suspect this is related to dehydration/hypotension when she first presented with DKA on her last admission -Overall her LFTs have trended down and continue to do so -She does not have any abdominal tenderness on exam and CT abdomen done during last admission did not show any biliary abnormalities -Bilirubin is also normal -Continue to hold statin for now  Diabetes mellitus, type II -Recent admission for DKA -A1c of 9.2 -She was discharged with metformin and Tradjenta.  She did start metformin, but has not started Tradjenta as of yet.  Family feels that there may be some cost related to insurance coverage  with Tradjenta -Discussed the possibility of discharging with insulin for now, but patient's husband feels that he would have difficulty administering insulin at home -We will continue with sliding scale for now and monitor blood sugars -Can consider starting glimepiride once determine consistency p.o. intake  Diminished p.o. intake -We will request dietitian evaluation for calorie count to determine true p.o. intake -If p.o. intake remains low, will need to discuss alternative nutrition options -Suspect this related to her progressive dementia -Continue on Remeron  Frontotemporal dementia -We will continue on Exelon patch -Family does report that she often does sundown at night -On her last admission, she did receive Ativan which led to worsening agitation  History of multiple myeloma -Follows with Dr. Julien Nordmann -Last clinic note notes that she is in remission and is continued on observation -We will trial Seroquel at night.  Check EKG to monitor QTc  [employer]

## 2022-08-08 DIAGNOSIS — F03C0 Unspecified dementia, severe, without behavioral disturbance, psychotic disturbance, mood disturbance, and anxiety: Secondary | ICD-10-CM | POA: Diagnosis not present

## 2022-08-08 DIAGNOSIS — L8990 Pressure ulcer of unspecified site, unspecified stage: Secondary | ICD-10-CM | POA: Insufficient documentation

## 2022-08-08 DIAGNOSIS — D649 Anemia, unspecified: Secondary | ICD-10-CM | POA: Diagnosis not present

## 2022-08-08 DIAGNOSIS — D696 Thrombocytopenia, unspecified: Secondary | ICD-10-CM | POA: Diagnosis not present

## 2022-08-08 LAB — CBC
HCT: 39.6 % (ref 36.0–46.0)
Hemoglobin: 12.8 g/dL (ref 12.0–15.0)
MCH: 27.8 pg (ref 26.0–34.0)
MCHC: 32.3 g/dL (ref 30.0–36.0)
MCV: 85.9 fL (ref 80.0–100.0)
Platelets: 114 10*3/uL — ABNORMAL LOW (ref 150–400)
RBC: 4.61 MIL/uL (ref 3.87–5.11)
RDW: 13.5 % (ref 11.5–15.5)
WBC: 6 10*3/uL (ref 4.0–10.5)
nRBC: 0 % (ref 0.0–0.2)

## 2022-08-08 LAB — GLUCOSE, CAPILLARY
Glucose-Capillary: 145 mg/dL — ABNORMAL HIGH (ref 70–99)
Glucose-Capillary: 151 mg/dL — ABNORMAL HIGH (ref 70–99)
Glucose-Capillary: 103 mg/dL — ABNORMAL HIGH (ref 70–99)
Glucose-Capillary: 159 mg/dL — ABNORMAL HIGH (ref 70–99)
Glucose-Capillary: 294 mg/dL — ABNORMAL HIGH (ref 70–99)

## 2022-08-08 MED ORDER — FAMOTIDINE 20 MG PO TABS
20.0000 mg | ORAL_TABLET | Freq: Two times a day (BID) | ORAL | Status: DC
  Administered 2022-08-08 – 2022-08-10 (×5): 20 mg via ORAL
  Filled 2022-08-08 (×5): qty 1

## 2022-08-08 MED ORDER — ADULT MULTIVITAMIN W/MINERALS CH
1.0000 | ORAL_TABLET | Freq: Every day | ORAL | Status: DC
  Administered 2022-08-08: 1 via ORAL
  Filled 2022-08-08: qty 1

## 2022-08-08 MED ORDER — ADULT MULTIVITAMIN LIQUID CH
15.0000 mL | Freq: Every day | ORAL | Status: DC
  Administered 2022-08-09 – 2022-08-10 (×2): 15 mL via ORAL
  Filled 2022-08-08 (×2): qty 15

## 2022-08-08 MED ORDER — MIRTAZAPINE 30 MG PO TBDP
30.0000 mg | ORAL_TABLET | Freq: Every day | ORAL | Status: DC
  Administered 2022-08-08 – 2022-08-09 (×2): 30 mg via ORAL
  Filled 2022-08-08 (×5): qty 1

## 2022-08-08 NOTE — Progress Notes (Signed)
PROGRESS NOTE    Victoria Gates  XEN:[id number] DOB: [birth year] DOA: 08/06/2022 PCP: Ann Held, DO    Brief Narrative:  71 year old female with dementia, diabetes, recent admission for DKA, was discharged from the hospital with metformin and Tradjenta.  She returned to the hospital within a few days with diminished p.o. intake and concerns for anemia.  Family notes that her p.o. intake has been poor.  Calorie count has been started to determine objectively have any calories she is taking in.  If significantly low, will need to discuss alternative nutrition options with family and other goals of care.  She was noted to be anemic/thrombocytopenic on arrival.  CBC was repeated the following morning which showed that hemoglobin was closer to her baseline.  She has not had any evidence of bleeding.   Assessment & Plan:   Principal Problem:   Anemia Active Problems:   Diet-controlled diabetes mellitus ([place])   Hypercholesterolemia   Essential hypertension   Multiple myeloma (HCC)   Hyperlipidemia   Frontotemporal dementia (HCC)   Malnutrition of moderate degree   Chronic liver failure (HCC)   Pressure injury of skin   Anemia -Patient found to have hemoglobin of 8.2 on arrival to the emergency room today. -On last discharge, hemoglobin was 11.7 -No reported bleeding since last discharge -H&H was repeated this morning and hemoglobin noted to be 11.9 -Unclear if initial hemoglobin on arrival was erroneous -Repeat CBC from this morning shows hemoglobin of 12.8 -B12/folate are not low -Iron/ferritin did not indicate severe iron deficiency   Thrombocytopenia -Present on last admission -Suspect that this may have been related to her acute illness -LDH mildly elevated, but serial platelet counts are trending up.   Elevated LFTs -Suspect this is related to dehydration/hypotension when she first presented with DKA on her last admission -Overall her LFTs have trended down and  continue to do so -She does not have any abdominal tenderness on exam and CT abdomen done during last admission did not show any biliary abnormalities -Bilirubin is also normal -Continue to hold statin for now   Diabetes mellitus, type II -Recent admission for DKA -A1c of 9.2 -She was discharged with metformin and Tradjenta.  She did start metformin, but has not started Tradjenta as of yet.  Family feels that there may be some cost related to insurance coverage with Tradjenta -Discussed the possibility of discharging with insulin for now, but patient's husband feels that he would have difficulty administering insulin at home -We will continue with sliding scale for now and monitor blood sugars -Can consider starting glimepiride once determine consistency p.o. intake   Diminished p.o. intake -We will request dietitian evaluation for calorie count to determine true p.o. intake -If p.o. intake remains low, will need to discuss alternative nutrition options -Suspect this related to her progressive dementia -Continue on Remeron   Frontotemporal dementia -We will continue on Exelon patch -Family does report that she often does sundown at night -On her last admission, she did receive Ativan which led to worsening agitation -Started on Seroquel nightly   History of multiple myeloma -Follows with Dr. Julien Nordmann -Last clinic note notes that she is in remission and plan is continued observation   DVT prophylaxis: SCDs Start: 08/07/22 0523  Code Status: Full code Family Communication: Updated patient's son over the phone Disposition Plan: Status is: Inpatient Remains inpatient appropriate because: Continued work-up for decreased p.o. intake     Consultants:    Procedures:    Antimicrobials:  Subjective: Patient is sleeping on my arrival, but wakes up to voice.  She denies any complaints.  Objective: Vitals:   08/08/22 0016 08/08/22 0500 08/08/22 0851 08/08/22 1530  BP:  117/68 (!) 132/97 136/81 108/62  Pulse: 99 91 83 97  Resp: _0 Temp: 99 F (37.2 C) 98.6 F (37 C) 98.6 F (37 C) 97.6 F (36.4 C)  TempSrc:  Temporal Axillary Oral  SpO2: 97% 98% 99% (!) 79%  Weight:      Height:        Intake/Output Summary (Last 24 hours) at 08/08/2022 1759 Last data filed at 08/08/2022 0400 Gross per 24 hour  Intake --  Output 600 ml  Net -600 ml   Filed Weights   08/07/22 1739  Weight: 76.6 kg    Examination:  General exam: Appears calm and comfortable  Respiratory system: Clear to auscultation. Respiratory effort normal. Cardiovascular system: S1 & S2 heard, RRR. No JVD, murmurs, rubs, gallops or clicks. No pedal edema. Gastrointestinal system: Abdomen is nondistended, soft and nontender. No organomegaly or masses felt. Normal bowel sounds heard. Central nervous system: No focal neurological deficits. Extremities: Symmetric 5 x 5 power. Skin: No rashes, lesions or ulcers Psychiatry: flat affect    Data Reviewed: I have personally reviewed following labs and imaging studies  CBC: Recent Labs  Lab 08/02/22 0958 08/03/22 0310 08/07/22 0113 08/07/22 0730 08/08/22 0030  WBC 9.3 7.9 4.3  --  6.0  NEUTROABS  --   --  1.9  --   --   HGB 13.6 11.7* 8.2* 11.9* 12.8  HCT 43.9 37.3 25.2* 36.7 39.6  MCV 91.1 88.8 88.4  --  85.9  PLT 76* 66* 70*  --  366*   Basic Metabolic Panel: Recent Labs  Lab 08/02/22 0538 08/02/22 1735 08/03/22 0310 08/03/22 1840 08/04/22 0500 08/06/22 2242  NA 160* 149* 151* 146* 140 141  K 3.4* 5.4* 3.8 3.7 4.0 3.7  CL 127* 121* 121* 113* 111 108  CO2 _1 21*  GLUCOSE 161* 191* 239* 252* 351* 247*  BUN _2 CREATININE 1.01* 0.75 0.82 0.80 0.73 0.83  CALCIUM 8.3* 8.2* 8.8* 8.7* 8.6* 9.3  MG 2.0 2.1 2.2  --   --   --   PHOS 3.3 2.9 3.1  --   --   --    GFR: Estimated Creatinine Clearance: 62.7 mL/min (by C-G formula based on SCr of 0.83 mg/dL). Liver Function Tests: Recent  Labs  Lab 08/02/22 0538 08/03/22 0310 08/04/22 0500 08/06/22 2242  AST 186* 116* 119* 77*  ALT 637* 449* 314* 194*  ALKPHOS 117 130* 147* 137*  BILITOT 0.8 0.4 0.5 0.7  PROT 5.8* 5.7* 5.3* 7.2  ALBUMIN 2.3* 2.3* 2.1* 3.0*   Recent Labs  Lab 08/06/22 2242  LIPASE 52*   Recent Labs  Lab 08/02/22 0958 08/07/22 0730  AMMONIA 55* 30   Coagulation Profile: Recent Labs  Lab 08/07/22 0730  INR 1.1   Cardiac Enzymes: No results for input(s): "CKTOTAL", "CKMB", "CKMBINDEX", "TROPONINI" in the last 168 hours. BNP (last 3 results) No results for input(s): "PROBNP" in the last 8760 hours. HbA1C: No results for input(s): "HGBA1C" in the last 72 hours. CBG: Recent Labs  Lab 08/07/22 2106 08/08/22 0649 08/08/22 0854 08/08/22 1154 08/08/22 1508  GLUCAP 155* 145* 151* 103* 159*   Lipid Profile: No results for input(s): "CHOL", "HDL", "LDLCALC", "TRIG", "CHOLHDL", "LDLDIRECT" in the last  72 hours. Thyroid Function Tests: No results for input(s): "TSH", "T4TOTAL", "FREET4", "T3FREE", "THYROIDAB" in the last 72 hours. Anemia Panel: Recent Labs    08/07/22 0730  VITAMINB12 1,336*  FOLATE 19.4  FERRITIN 182  TIBC 242*  IRON 41  RETICCTPCT 1.6   Sepsis Labs: Recent Labs  Lab 08/02/22 0538 08/02/22 0958 08/03/22 0310  PROCALCITON  --  0.30 0.81  LATICACIDVEN 1.2  --   --     Recent Results (from the past 240 hour(s))  Blood Culture (routine x 2)     Status: None   Collection Time: 07/31/22  8:49 AM   Specimen: BLOOD  Result Value Ref Range Status   Specimen Description BLOOD LEFT ANTECUBITAL  Final   Special Requests   Final    BOTTLES DRAWN AEROBIC AND ANAEROBIC Blood Culture adequate volume   Culture   Final    NO GROWTH 5 DAYS Performed at [facility], [address] [place], [place] [address]    Report Status 08/05/2022 FINAL  Final  Urine Culture     Status: None   Collection Time: 07/31/22  8:49 AM   Specimen: In/Out Cath Urine  Result Value  Ref Range Status   Specimen Description IN/OUT CATH URINE  Final   Special Requests NONE  Final   Culture   Final    NO GROWTH Performed at [facility], [place] [address] [place], [place] [address]    Report Status 08/01/2022 FINAL  Final         Radiology Studies: No results found.      Scheduled Meds:  famotidine  20 mg Oral BID   feeding supplement  237 mL Oral BID BM   insulin aspart  0-15 Units Subcutaneous TID WC   insulin aspart  0-5 Units Subcutaneous QHS   mirtazapine  30 mg Oral QHS   [START ON 08/09/2022] multivitamin  15 mL Oral Daily   QUEtiapine  25 mg Oral Daily   Continuous Infusions:   LOS: 1 day    Time spent: 35 mins    Kathie Dike, MD [facility]   If 7PM-7AM, please contact night-coverage [email or web address]  08/08/2022, 5:59 PM

## 2022-08-08 NOTE — Progress Notes (Signed)
Initial Nutrition Assessment  DOCUMENTATION CODES:   Non-severe (moderate) malnutrition in context of chronic illness  INTERVENTION:  - Start 24 hr calorie count.   - Continue Ensure Enlive po BID, each supplement provides 350 kcal and 20 grams of protein.  - Add MVI q day.   NUTRITION DIAGNOSIS:   Moderate Malnutrition related to chronic illness (dementia) as evidenced by mild fat depletion, mild muscle depletion.  GOAL:   Patient will meet greater than or equal to 90% of their needs  MONITOR:   PO intake, Supplement acceptance  REASON FOR ASSESSMENT:   Consult Assessment of nutrition requirement/status  ASSESSMENT:   71 y.o. female admits related to poor nutrition and anemia. PMH includes: T2DM, GERD, multiple myeloma in remission, HLD, iron deficiency anemia, frontotemporal dementia, and esophageal stricture.  Meds include: sliding scale insulin, remeron. Labs reviewed.   Pt was sleeping at time of assessment. No family present at bedside. Pt in mitts. Pt here during recent admission. According to most recent admission, pt had a Cortrak tube to help her meet her nutritional needs. Pt with dementia and oriented x1. Pt also full code and currently aggressive measures are desired. Spoke with MD, plan is to start a 24 hr calorie count to see how much food pt takes in PO. If PO intakes are poor, will potentially plan to have Cortrak placed on 11/10 to provide temporary nutrition until long-term plan can be established.   Spoke with RN. Pt had not received breakfast at time of assessment. Discussed calorie count with RN. RD will continue to monitor and collect results on 11/10.   NUTRITION - FOCUSED PHYSICAL EXAM:  Flowsheet Row Most Recent Value  Orbital Region Mild depletion  Upper Arm Region Mild depletion  Thoracic and Lumbar Region Unable to assess  Buccal Region Mild depletion  Temple Region Moderate depletion  Clavicle Bone Region Moderate depletion  Clavicle and  Acromion Bone Region Moderate depletion  Scapular Bone Region Unable to assess  Dorsal Hand Mild depletion  Patellar Region Mild depletion  Anterior Thigh Region Mild depletion  Posterior Calf Region Mild depletion  Edema (RD Assessment) None  Hair Reviewed  Eyes Reviewed  Mouth Reviewed  Skin Reviewed  Nails Reviewed       Diet Order:   Diet Order             Diet regular Room service appropriate? No; Fluid consistency: Thin  Diet effective now                   EDUCATION NEEDS:   Not appropriate for education at this time  Skin:  Skin Assessment: Skin Integrity Issues: Skin Integrity Issues:: Stage II Stage II: sacrum  Last BM:  08/03/22  Height:   Ht Readings from Last 1 Encounters:  08/07/22 _0  (1.727 m)    Weight:   Wt Readings from Last 1 Encounters:  08/07/22 76.6 kg    Ideal Body Weight:  63.6 kg  BMI:  Body mass index is 25.68 kg/m.  Estimated Nutritional Needs:   Kcal:  1915-2300 kcals  Protein:  95-115 gm  Fluid:  >/= 1.9 L  Thalia Bloodgood, RD, LDN, CNSC

## 2022-08-09 DIAGNOSIS — F03C0 Unspecified dementia, severe, without behavioral disturbance, psychotic disturbance, mood disturbance, and anxiety: Secondary | ICD-10-CM | POA: Diagnosis not present

## 2022-08-09 DIAGNOSIS — D696 Thrombocytopenia, unspecified: Secondary | ICD-10-CM | POA: Diagnosis not present

## 2022-08-09 DIAGNOSIS — D649 Anemia, unspecified: Secondary | ICD-10-CM | POA: Diagnosis not present

## 2022-08-09 LAB — COMPREHENSIVE METABOLIC PANEL
ALT: 95 U/L — ABNORMAL HIGH (ref 0–44)
AST: 41 U/L (ref 15–41)
Albumin: 2.4 g/dL — ABNORMAL LOW (ref 3.5–5.0)
Alkaline Phosphatase: 101 U/L (ref 38–126)
Anion gap: 9 (ref 5–15)
BUN: 6 mg/dL — ABNORMAL LOW (ref 8–23)
CO2: 20 mmol/L — ABNORMAL LOW (ref 22–32)
Calcium: 8.5 mg/dL — ABNORMAL LOW (ref 8.9–10.3)
Chloride: 107 mmol/L (ref 98–111)
Creatinine, Ser: 0.76 mg/dL (ref 0.44–1.00)
GFR, Estimated: >60 mL/min (ref >60)
Glucose, Bld: 198 mg/dL — ABNORMAL HIGH (ref 70–99)
Potassium: 3.4 mmol/L — ABNORMAL LOW (ref 3.5–5.1)
Sodium: 136 mmol/L (ref 135–145)
Total Bilirubin: 0.5 mg/dL (ref 0.3–1.2)
Total Protein: 5.9 g/dL — ABNORMAL LOW (ref 6.5–8.1)

## 2022-08-09 LAB — GLUCOSE, CAPILLARY
Glucose-Capillary: 222 mg/dL — ABNORMAL HIGH (ref 70–99)
Glucose-Capillary: 222 mg/dL — ABNORMAL HIGH (ref 70–99)
Glucose-Capillary: 291 mg/dL — ABNORMAL HIGH (ref 70–99)
Glucose-Capillary: 232 mg/dL — ABNORMAL HIGH (ref 70–99)
Glucose-Capillary: 215 mg/dL — ABNORMAL HIGH (ref 70–99)
Glucose-Capillary: 98 mg/dL (ref 70–99)

## 2022-08-09 MED ORDER — POTASSIUM CHLORIDE 20 MEQ PO PACK
40.0000 meq | PACK | Freq: Once | ORAL | Status: AC
  Administered 2022-08-09: 40 meq via ORAL
  Filled 2022-08-09: qty 2

## 2022-08-09 MED ORDER — LINAGLIPTIN 5 MG PO TABS
5.0000 mg | ORAL_TABLET | Freq: Every day | ORAL | Status: DC
  Administered 2022-08-09 – 2022-08-10 (×2): 5 mg via ORAL
  Filled 2022-08-09 (×2): qty 1

## 2022-08-09 NOTE — Evaluation (Signed)
Clinical/Bedside Swallow Evaluation Patient Details  Name: Victoria Gates MRN: [id number] Date of Birth: [birth year]  Today's Date: 08/09/2022 Time: SLP Start Time (ACUTE ONLY): 1655 SLP Stop Time (ACUTE ONLY): 1712 SLP Time Calculation (min) (ACUTE ONLY): 17 min  Past Medical History:  Past Medical History:  Diagnosis Date   Allergic rhinitis 01/02/2009   Bradycardia 04/29/2016   Cervical Radiculopathy 02/01/2010   Left side   Chest pain 04/15/2012   Ex MV: Ex time 10 mins, EF 63%, no ischemia/infarct. Occas PAC's/PVC's.   Diabetes mellitus, type II 03/18/2008   Encounter for antineoplastic chemotherapy 04/29/2016   Esophageal Stricture 04/15/2007   s/p dilitation   GERD (gastroesophageal reflux disease) 09/21/2010   Herpes zoster 11/05/2010   Hiatal Hernia 04/15/2007   History of colonic polyps 04/24/2007   Hyperplastic only   Hypercholesterolemia 07/03/2009   Hypokalemia 11/30/2015   Iron deficiency anemia 04/24/2007   Multiple myeloma ([place]) 02/2014   Osteoporosis 04/24/2007   Past Surgical History:  Past Surgical History:  Procedure Laterality Date   ESOPHAGOGASTRODUODENOSCOPY  04/15/2007   TUBAL LIGATION     HPI:  Pt is a 71 year old female who was brought to the ED since the pt stopped eating. Pt recently admitted 11/1 to 11/5 DKA, hypernatremia and altered mentation. Poor oral intake was noted during that admission and thought to be due to her baseline dementia. Per RD's note 11/10, "Husband also reports that the pt does not do well with soft and bite-sized foods and has been pocketing food. RD will downgrade to Dys 1" SLP was consulted thereafter. PMH: DM type II, GERD, multiple myeloma in remission, hyperlipidemia, iron deficiency anemia, frontotemporal dementia and esophageal stricture.    Assessment / Plan / Recommendation  Clinical Impression  Pt was seen for bedside swallow evaluation; per pt's RN, the pt's husband stated that the pt prefers purees at baseline.  Dentition was limited and oral mucosa moist. Pt exhibited mild oral holding with purees and prolonged mastication with dysphagia 3 solids. Coughing was inconsistently noted with dysphagia 3 solids and a delayed cough was observed once with thin liquids via straw. Pt's current diet of dysphagia 1 solids and thin liquids will be continued and SLP will follow briefly to ensure tolerance. SLP Visit Diagnosis: Dysphagia, unspecified (R13.10)    Aspiration Risk  Mild aspiration risk    Diet Recommendation Thin liquid;Dysphagia 1 (Puree)   Liquid Administration via: Cup;Straw Medication Administration: Crushed with puree Supervision: Staff to assist with self feeding Compensations: Slow rate;Small sips/bites;Minimize environmental distractions Postural Changes: Seated upright at 90 degrees    Other  Recommendations Oral Care Recommendations: Oral care BID    Recommendations for follow up therapy are one component of a multi-disciplinary discharge planning process, led by the attending physician.  Recommendations may be updated based on patient status, additional functional criteria and insurance authorization.  Follow up Recommendations  (TBD)      Assistance Recommended at Discharge Frequent or constant Supervision/Assistance  Functional Status Assessment Patient has not had a recent decline in their functional status  Frequency and Duration min 2x/week  1 week       Prognosis Prognosis for Safe Diet Advancement: Fair Barriers to Reach Goals: Cognitive deficits      Swallow Study   General Date of Onset: 08/09/22 HPI: Pt is a 71 year old female who was brought to the ED since the pt stopped eating. Pt recently admitted 11/1 to 11/5 DKA, hypernatremia and altered mentation. Poor oral intake was noted during that admission  and thought to be due to her baseline dementia. Per RD's note 11/10, "Husband also reports that the pt does not do well with soft and bite-sized foods and has been  pocketing food. RD will downgrade to Dys 1" SLP was consulted thereafter. PMH: DM type II, GERD, multiple myeloma in remission, hyperlipidemia, iron deficiency anemia, frontotemporal dementia and esophageal stricture. Type of Study: Bedside Swallow Evaluation Previous Swallow Assessment: none Diet Prior to this Study: Dysphagia 1 (puree);Thin liquids Temperature Spikes Noted: No Respiratory Status: Room air History of Recent Intubation: No Behavior/Cognition: Alert;Cooperative;Requires cueing;Doesn't follow directions Oral Cavity Assessment: Within Functional Limits Oral Care Completed by SLP: No Oral Cavity - Dentition: Missing dentition Self-Feeding Abilities: Needs assist Patient Positioning: Upright in bed;Postural control adequate for testing Baseline Vocal Quality: Normal Volitional Cough: Cognitively unable to elicit Volitional Swallow: Unable to elicit    Oral/Motor/Sensory Function Overall Oral Motor/Sensory Function:  (difficult toa ssess)   Ice Chips Ice chips: Not tested   Thin Liquid Thin Liquid: Impaired Presentation: Straw Pharyngeal  Phase Impairments: Cough - Delayed    Nectar Thick Nectar Thick Liquid: Not tested   Honey Thick Honey Thick Liquid: Not tested   Puree Puree: Impaired Presentation: Spoon Oral Phase Functional Implications: Oral holding   Solid     Solid: Impaired Oral Phase Impairments: Impaired mastication Oral Phase Functional Implications: Impaired mastication     Victoria Gates, [place], [place] Office number [phone]  Horton Marshall 08/09/2022,5:22 PM

## 2022-08-09 NOTE — Progress Notes (Signed)
PROGRESS NOTE    Victoria Gates  DGU:[id number] DOB: Apr 09, 1951 DOA: 08/06/2022 PCP: Ann Held, DO    Brief Narrative:  71 year old female with dementia, diabetes, recent admission for DKA, was discharged from the hospital with metformin and Tradjenta.  She returned to the hospital within a few days with diminished p.o. intake and concerns for anemia.  Family notes that her p.o. intake has been poor.  Calorie count has been started to determine objectively have any calories she is taking in.  If significantly low, will need to discuss alternative nutrition options with family and other goals of care.  She was noted to be anemic/thrombocytopenic on arrival.  CBC was repeated the following morning which showed that hemoglobin was closer to her baseline.  She has not had any evidence of bleeding.   Assessment & Plan:   Principal Problem:   Anemia Active Problems:   Diet-controlled diabetes mellitus ([place])   Hypercholesterolemia   Essential hypertension   Multiple myeloma (HCC)   Hyperlipidemia   Frontotemporal dementia (HCC)   Malnutrition of moderate degree   Chronic liver failure (HCC)   Pressure injury of skin   Anemia -Patient found to have hemoglobin of 8.2 on arrival to the emergency room today. -On last discharge, hemoglobin was 11.7 -No reported bleeding since last discharge -H&H was repeated this morning and hemoglobin noted to be 11.9 -Unclear if initial hemoglobin on arrival was erroneous -Repeat CBC from this morning shows hemoglobin of 12.8 -B12/folate are not low -Iron/ferritin did not indicate severe iron deficiency   Thrombocytopenia -Present on last admission -Suspect that this may have been related to her acute illness -LDH mildly elevated, but serial platelet counts are trending up.   Elevated LFTs -Suspect this is related to dehydration/hypotension when she first presented with DKA on her last admission -Overall her LFTs have trended down and  continue to do so -She does not have any abdominal tenderness on exam and CT abdomen done during last admission did not show any biliary abnormalities -Bilirubin is also normal -Continue to hold statin for now   Diabetes mellitus, type II -Recent admission for DKA -A1c of 9.2 -She was discharged with metformin and Tradjenta.  She did start metformin, but has not started Tradjenta as of yet.  Family feels that there may be some cost related to insurance coverage with [employer] -Discussed the possibility of discharging with insulin for now, but patient's husband feels that he would have difficulty administering insulin at home -We will continue with sliding scale for now and monitor blood sugars -restart linagliptin today. Her insurance will cover sitagliptin on discharge   Diminished p.o. intake -We will request dietitian evaluation for calorie count to determine true p.o. intake -If p.o. intake remains low, will need to discuss alternative nutrition options -Suspect this related to her progressive dementia -Continue on Remeron   Frontotemporal dementia -We will continue on Exelon patch -Family does report that she often does sundown at night -On her last admission, she did receive Ativan which led to worsening agitation -Started on Seroquel nightly   History of multiple myeloma -Follows with Dr. Julien Nordmann -Last clinic note notes that she is in remission and plan is continued observation   DVT prophylaxis: SCDs Start: 08/07/22 0523  Code Status: Full code Family Communication: Updated patient's son over the phone Disposition Plan: Status is: Inpatient Remains inpatient appropriate because: Continued work-up for decreased p.o. intake     Consultants:    Procedures:    Antimicrobials:  Subjective: Patient sitting up on side of bed.  Resident reports that p.o. intake is improving.  She was able to ambulate in the hall.  Objective: Vitals:   08/09/22 0615 08/09/22  0916 08/09/22 1603 08/09/22 2117  BP: 96/68 97/61 (!) 112/101 (!) 88/69  Pulse: 100 (!) 108 94 98  Resp: _0 Temp: 98.6 F (37 C) 98.7 F (37.1 C) (!) 97.5 F (36.4 C) 98.6 F (37 C)  TempSrc:  Oral Oral   SpO2: 96% 96% 97% 100%  Weight:      Height:        Intake/Output Summary (Last 24 hours) at 08/09/2022 2200 Last data filed at 08/09/2022 1637 Gross per 24 hour  Intake --  Output 400 ml  Net -400 ml   Filed Weights   08/07/22 1739  Weight: 76.6 kg    Examination:  General exam: Appears calm and comfortable  Respiratory system: Clear to auscultation. Respiratory effort normal. Cardiovascular system: S1 & S2 heard, RRR. No JVD, murmurs, rubs, gallops or clicks. No pedal edema. Gastrointestinal system: Abdomen is nondistended, soft and nontender. No organomegaly or masses felt. Normal bowel sounds heard. Central nervous system: No focal neurological deficits. Extremities: Symmetric 5 x 5 power. Skin: No rashes, lesions or ulcers Psychiatry: flat affect    Data Reviewed: I have personally reviewed following labs and imaging studies  CBC: Recent Labs  Lab 08/03/22 0310 08/07/22 0113 08/07/22 0730 08/08/22 0030  WBC 7.9 4.3  --  6.0  NEUTROABS  --  1.9  --   --   HGB 11.7* 8.2* 11.9* 12.8  HCT 37.3 25.2* 36.7 39.6  MCV 88.8 88.4  --  85.9  PLT 66* 70*  --  568*   Basic Metabolic Panel: Recent Labs  Lab 08/03/22 0310 08/03/22 1840 08/04/22 0500 08/06/22 2242 08/09/22 0337  NA 151* 146* 140 141 136  K 3.8 3.7 4.0 3.7 3.4*  CL 121* 113* 111 108 107  CO2 _1 21* 20*  GLUCOSE 239* 252* 351* 247* 198*  BUN _2 6*  CREATININE 0.82 0.80 0.73 0.83 0.76  CALCIUM 8.8* 8.7* 8.6* 9.3 8.5*  MG 2.2  --   --   --   --   PHOS 3.1  --   --   --   --    GFR: Estimated Creatinine Clearance: 65.1 mL/min (by C-G formula based on SCr of 0.76 mg/dL). Liver Function Tests: Recent Labs  Lab 08/03/22 0310 08/04/22 0500 08/06/22 2242  08/09/22 0337  AST 116* 119* 77* 41  ALT 449* 314* 194* 95*  ALKPHOS 130* 147* 137* 101  BILITOT 0.4 0.5 0.7 0.5  PROT 5.7* 5.3* 7.2 5.9*  ALBUMIN 2.3* 2.1* 3.0* 2.4*   Recent Labs  Lab 08/06/22 2242  LIPASE 52*   Recent Labs  Lab 08/07/22 0730  AMMONIA 30   Coagulation Profile: Recent Labs  Lab 08/07/22 0730  INR 1.1   Cardiac Enzymes: No results for input(s): "CKTOTAL", "CKMB", "CKMBINDEX", "TROPONINI" in the last 168 hours. BNP (last 3 results) No results for input(s): "PROBNP" in the last 8760 hours. HbA1C: No results for input(s): "HGBA1C" in the last 72 hours. CBG: Recent Labs  Lab 08/09/22 0617 08/09/22 0845 08/09/22 1213 08/09/22 1720 08/09/22 2153  GLUCAP 222* 291* 232* 215* 98   Lipid Profile: No results for input(s): "CHOL", "HDL", "LDLCALC", "TRIG", "CHOLHDL", "LDLDIRECT" in the last 72 hours. Thyroid Function Tests: No results for input(s): "TSH", "T4TOTAL", "  FREET4", "T3FREE", "THYROIDAB" in the last 72 hours. Anemia Panel: Recent Labs    08/07/22 0730  VITAMINB12 1,336*  FOLATE 19.4  FERRITIN 182  TIBC 242*  IRON 41  RETICCTPCT 1.6   Sepsis Labs: Recent Labs  Lab 08/03/22 0310  PROCALCITON 0.81    Recent Results (from the past 240 hour(s))  Blood Culture (routine x 2)     Status: None   Collection Time: 07/31/22  8:49 AM   Specimen: BLOOD  Result Value Ref Range Status   Specimen Description BLOOD LEFT ANTECUBITAL  Final   Special Requests   Final    BOTTLES DRAWN AEROBIC AND ANAEROBIC Blood Culture adequate volume   Culture   Final    NO GROWTH 5 DAYS Performed at [facility], [address], [place], [place] [address]    Report Status 08/05/2022 FINAL  Final  Urine Culture     Status: None   Collection Time: 07/31/22  8:49 AM   Specimen: In/Out Cath Urine  Result Value Ref Range Status   Specimen Description IN/OUT CATH URINE  Final   Special Requests NONE  Final   Culture   Final    NO GROWTH Performed at  [facility], [place] [address], [place], [place] [address]    Report Status 08/01/2022 FINAL  Final         Radiology Studies: No results found.      Scheduled Meds:  famotidine  20 mg Oral BID   insulin aspart  0-15 Units Subcutaneous TID WC   insulin aspart  0-5 Units Subcutaneous QHS   linagliptin  5 mg Oral Daily   mirtazapine  30 mg Oral QHS   multivitamin  15 mL Oral Daily   QUEtiapine  25 mg Oral Daily   Continuous Infusions:   LOS: 2 days    Time spent: 35 mins    Kathie Dike, MD [facility]   If 7PM-7AM, please contact night-coverage [email or web address]  08/09/2022, 10:00 PM

## 2022-08-09 NOTE — Progress Notes (Addendum)
Calorie Count Note  24 hour calorie count ordered.  Diet: Dys 1   Supplements: Ensure Enlive BID (husband is bring Ensure Complete from home).   Breakfast: 0%  Lunch: 510 kcals, 32 gm protein  Dinner: 817 kcals, 38 gm protein   Total intake: 1327 kcal (70% of minimum estimated needs)  70 protein (73% of minimum estimated needs)  Pt's husband at bedside. He reports that the pt drank a whole Ensure Complete this am with breakfast. He states that she will typically drink 3-4 Ensure per day. Each one provides 350 kcals and 30 gm protein. If pt drinks 4 per day with the addition of other things like mashed potatoes and gravy and applesauce she will come closer to meeting her needs. Husband also reports that the pt does not do well with soft and bite-sized foods and has been pocketing food. RD will downgrade to Dys 1 and recommend SLP eval. Will also discontinue supplements as pt only likes her supps from home. Will continue to closely monitor PO intakes.   Nutrition Dx: Moderate Malnutrition related to chronic illness (dementia) as evidenced by mild fat depletion, mild muscle depletion.   Goal:  Patient will meet greater than or equal to 90% of their needs   Intervention:  - Continue to monitor PO intakes.  - Discontinue Ensure Enlive (pt only likes Ensure Complete)  - Spoke with MD about SLP eval  - Downgrade diet to Dys 1  - Add Magic Cup BID.   Thalia Bloodgood, RD, LDN, CNSC.

## 2022-08-09 NOTE — Inpatient Diabetes Management (Signed)
Inpatient Diabetes Program Recommendations  AACE/ADA: New Consensus Statement on Inpatient Glycemic Control (2015)  Target Ranges:  Prepandial:   less than 140 mg/dL      Peak postprandial:   less than 180 mg/dL (1-2 hours)      Critically ill patients:  140 - 180 mg/dL    Latest Reference Range & Units 08/08/22 06:49 08/08/22 08:54 08/08/22 11:54 08/08/22 15:08 08/08/22 22:02  Glucose-Capillary 70 - 99 mg/dL 145 (H) 151 (H)  3 units Novolog  103 (H) 159 (H)  3 units Novolog '@1743'$  294 (H)  3 units Novolog   (H): Data is abnormally high  Latest Reference Range & Units 08/09/22 06:17  Glucose-Capillary 70 - 99 mg/dL 222 (H)  (H): Data is abnormally high      Home DM Meds: Tradjenta 5 mg daily (new Rx--Note taking yet)        Metformin 500 mg BID  Current Orders: Novolog Moderate Correction Scale/ SSI (0-15 units) TID AC + HS      Tradjenta 5 mg daily     Looks like Metformin and Tradjenta were started at time of d/c from hospital 08/04/2022   MD- Note husband does not want to administer insulin to pt at home.  Tradjenta started this afternoon  To help manage CBGs and determine medication needs for discharge, please consider increasing the frequency of the Novolog SSi to Q4 hours     --Will follow patient during hospitalization--  Wyn Quaker RN, MSN, [place] Diabetes Coordinator Inpatient Glycemic Control Team Team Pager: [phone] (8a-5p)

## 2022-08-09 NOTE — Progress Notes (Signed)
Mobility Specialist Progress Note:   08/09/22 1028  Mobility  Activity Ambulated with assistance in hallway  Level of Assistance Contact guard assist, steadying assist  Assistive Device Other (Comment) (HHA)  Distance Ambulated (ft) 600 ft  Activity Response Tolerated well  Mobility Referral Yes  $Mobility charge 1 Mobility   Pt received in bed and agreeable. Pt asymptomatic throughout session. Pt left sitting EOB with family sitting beside her. All needs met and call bell in reach.   Maryagnes Carrasco Mobility Specialist-Acute Rehab Secure Chat only

## 2022-08-10 DIAGNOSIS — E119 Type 2 diabetes mellitus without complications: Secondary | ICD-10-CM | POA: Diagnosis not present

## 2022-08-10 DIAGNOSIS — L89302 Pressure ulcer of unspecified buttock, stage 2: Secondary | ICD-10-CM | POA: Diagnosis present

## 2022-08-10 DIAGNOSIS — D649 Anemia, unspecified: Secondary | ICD-10-CM | POA: Diagnosis not present

## 2022-08-10 DIAGNOSIS — G3109 Other frontotemporal dementia: Secondary | ICD-10-CM

## 2022-08-10 DIAGNOSIS — F0280 Dementia in other diseases classified elsewhere without behavioral disturbance: Secondary | ICD-10-CM

## 2022-08-10 DIAGNOSIS — I10 Essential (primary) hypertension: Secondary | ICD-10-CM | POA: Diagnosis not present

## 2022-08-10 LAB — GLUCOSE, CAPILLARY
Glucose-Capillary: 197 mg/dL — ABNORMAL HIGH (ref 70–99)
Glucose-Capillary: 308 mg/dL — ABNORMAL HIGH (ref 70–99)

## 2022-08-10 MED ORDER — SITAGLIPTIN PHOSPHATE 50 MG PO TABS: 50.0000 mg | ORAL_TABLET | Freq: Every day | ORAL | 0 refills | Status: DC

## 2022-08-10 MED ORDER — MIRTAZAPINE 30 MG PO TBDP: 30.0000 mg | ORAL_TABLET | Freq: Every day | ORAL | 0 refills | Status: DC

## 2022-08-10 NOTE — Discharge Summary (Signed)
Physician Discharge Summary  Victoria Gates DSK:[id number] DOB: 1951-04-07 DOA: 08/06/2022  PCP: Ann Held, DO  Admit date: 08/06/2022 Discharge date: 08/10/2022  Admitted From: Home Disposition: Home  Recommendations for Outpatient Follow-up:   Please obtain BMP/CBC in one week Follow up with primary care physician on Monday as previously scheduled  Home Health: Home health RN Equipment/Devices:  Discharge Condition: Stable CODE STATUS: Full code Diet recommendation: Pured diet with thin liquids, carb modified  Brief/Interim Summary: 71 year old female with dementia, diabetes, recent admission for DKA, was discharged from the hospital with metformin and Tradjenta.  She returned to the hospital within a few days with diminished p.o. intake and concerns for anemia.  Family notes that her p.o. intake has been poor.  Calorie count has been started to determine objectively have any calories she is taking in.  If significantly low, will need to discuss alternative nutrition options with family and other goals of care.  She was noted to be anemic/thrombocytopenic on arrival.  CBC was repeated the following morning which showed that hemoglobin was closer to her baseline.  She has not had any evidence of bleeding.   Discharge Diagnoses:  Principal Problem:   Anemia Active Problems:   Diet-controlled diabetes mellitus ([place])   Hypercholesterolemia   Essential hypertension   Multiple myeloma (HCC)   Hyperlipidemia   Frontotemporal dementia (HCC)   Malnutrition of moderate degree   Chronic liver failure (HCC)   Pressure injury of skin   Anemia -Patient found to have hemoglobin of 8.2 on arrival to the emergency room today. -On last discharge, hemoglobin was 11.7 -No reported bleeding since last discharge -H&H was repeated this morning and hemoglobin noted to be 11.9 -Unclear if initial hemoglobin on arrival was erroneous -Repeat CBC f showed hemoglobin of 12.8 -B12/folate  are not low -Iron/ferritin did not indicate severe iron deficiency   Thrombocytopenia -Present on last admission -Suspect that this may have been related to her acute illness -LDH mildly elevated, but serial platelet counts are trending up.   Elevated LFTs -Suspect this is related to dehydration/hypotension when she first presented with DKA on her last admission -Overall her LFTs have trended down to near normal -She does not have any abdominal tenderness on exam and CT abdomen done during last admission did not show any biliary abnormalities -Bilirubin is also normal -I think it would be reasonable to restart statin at this point   Diabetes mellitus, type II -Recent admission for DKA -A1c of 9.2 -She was discharged with metformin and Tradjenta.  She did start metformin, but has not started Tradjenta as of yet.  Unfortunately, [facility] was not covered by her insurance -Discussed the possibility of discharging with insulin for now, but patient's husband feels that he would have difficulty administering insulin at home -Since she had significant GI upset with metformin, will not continue on discharge -She was started on Tradjenta in the hospital with good effect on her blood sugars.  Her insurance will cover Januvia.  We will discharge her with a prescription for Januvia until she can follow-up with her primary care physician   Diminished p.o. intake -Suspect this related to her progressive dementia -Continue on Remeron -Seen by dietitian and underwent calorie count.  Noted to be meeting approximately 70% of her caloric needs -Holding off on any conversations around tube feeding and encouraging further p.o. intake at home.   Frontotemporal dementia -We will continue on Exelon patch -Family does report that she often does sundown at night -  Likely being at home and a family environment will help with agitation   History of multiple myeloma -Follows with Dr. Julien Nordmann -Last clinic note  notes that she is in remission and plan is continued observation  Discharge Instructions  Discharge Instructions     Diet - low sodium heart healthy   Complete by: As directed    Discharge wound care:   Complete by: As directed    Continue supportive/padded dressing and try and keep pressure off wound as much as possible   Increase activity slowly   Complete by: As directed       Allergies as of 08/10/2022       Reactions   Atorvastatin    REACTION: myalgias   Rosuvastatin    REACTION: myalgias   Sulfonamide Derivatives    Unknown reaction         Medication List     STOP taking these medications    linagliptin 5 MG Tabs tablet Commonly known as: Tradjenta   metFORMIN 500 MG tablet Commonly known as: GLUCOPHAGE   mirtazapine 30 MG tablet Commonly known as: REMERON Replaced by: mirtazapine 30 MG disintegrating tablet       TAKE these medications    blood glucose meter kit and supplies Kit Dispense based on patient and insurance preference. Use daily to check blood sugar.   docusate sodium 100 MG capsule Commonly known as: COLACE Take 200 mg by mouth daily.   feeding supplement (ENSURE COMPLETE) Liqd Take 237 mLs by mouth 3 (three) times daily between meals.   lansoprazole 30 MG capsule Commonly known as: PREVACID Take 30 mg by mouth daily with breakfast.   mirtazapine 30 MG disintegrating tablet Commonly known as: REMERON SOL-TAB Take 1 tablet (30 mg total) by mouth at bedtime. Replaces: mirtazapine 30 MG tablet   multivitamin with minerals Tabs tablet Take 1 tablet by mouth daily.   Pitavastatin Calcium 4 MG Tabs Take 1 tablet (4 mg total) by mouth daily.   rivastigmine 1.5 MG capsule Commonly known as: EXELON Take 1 capsule (1.5 mg total) by mouth 2 (two) times daily. What changed: additional instructions   sitaGLIPtin 50 MG tablet Commonly known as: Januvia Take 1 tablet (50 mg total) by mouth daily.               Discharge  Care Instructions  (From admission, onward)           Start     Ordered   08/10/22 0000  Discharge wound care:       Comments: Continue supportive/padded dressing and try and keep pressure off wound as much as possible   08/10/22 1037            Follow-up Information     [facility].   Why: For [facility] services. They will call you in 1-2 days to set up [facility] services Contact information: [place] [address] [phone]                Allergies  Allergen Reactions   Atorvastatin     REACTION: myalgias   Rosuvastatin     REACTION: myalgias   Sulfonamide Derivatives     Unknown reaction     Consultations:    Procedures/Studies: DG Abd Portable 1V  Result Date: 08/02/2022 CLINICAL DATA:  Feeding tube placement. EXAM: PORTABLE ABDOMEN - 1 VIEW COMPARISON:  August 01, 2022. FINDINGS: Distal tip of feeding tube is seen in expected position of distal  stomach. IMPRESSION: Distal tip of feeding tube seen in expected position of distal stomach. Electronically Signed   By: Marijo Conception M.D.   On: 08/02/2022 12:40   DG Abd Portable 1V  Result Date: 08/01/2022 CLINICAL DATA:  NG tube placement EXAM: PORTABLE ABDOMEN - 1 VIEW COMPARISON:  CT done on 07/31/2022 FINDINGS: Tip of NG tube is seen in the region of body of stomach. Bowel gas pattern is nonspecific. No abnormal masses or calcifications are seen. Lower pelvis is not included in the image. IMPRESSION: Tip of NG tube is seen in the stomach. Electronically Signed   By: Elmer Picker M.D.   On: 08/01/2022 12:55   [place] EKG SITE RITE  Result Date: 08/01/2022 If Site Rite image not attached, placement could not be confirmed due to current cardiac rhythm.  CT ABDOMEN PELVIS WO CONTRAST  Result Date: 07/31/2022 CLINICAL DATA:  Fatigue and encephalopathy. Acute liver and kidney injury. History of multiple myeloma. EXAM: CT ABDOMEN AND PELVIS WITHOUT CONTRAST TECHNIQUE:  Multidetector CT imaging of the abdomen and pelvis was performed following the standard protocol without IV contrast. RADIATION DOSE REDUCTION: This exam was performed according to the departmental dose-optimization program which includes automated exposure control, adjustment of the mA and/or kV according to patient size and/or use of iterative reconstruction technique. COMPARISON:  Right upper quadrant ultrasound from same day. Abdominal ultrasound dated September 25, 2010. FINDINGS: Lower chest: No acute abnormality. Mild subsegmental atelectasis at both lung bases. Hepatobiliary: No focal liver abnormality is seen. No gallstones, gallbladder wall thickening, or biliary dilatation. Pancreas: Unremarkable. No pancreatic ductal dilatation or surrounding inflammatory changes. Spleen: Normal in size without focal abnormality. Adrenals/Urinary Tract: Adrenal glands are unremarkable. Kidneys are normal, without renal calculi, focal lesion, or hydronephrosis. Bladder is unremarkable. Stomach/Bowel: Small hiatal hernia. The stomach is otherwise within normal limits. No bowel wall thickening, distention, or surrounding inflammatory changes. Normal appendix. Vascular/Lymphatic: Aortic atherosclerosis. No enlarged abdominal or pelvic lymph nodes. Reproductive: Uterus and bilateral adnexa are unremarkable. Other: No abdominal wall hernia or abnormality. No abdominopelvic ascites. No pneumoperitoneum. Musculoskeletal: Multiple scattered lucent lesions in the visualized spine and pelvis. No acute fracture. IMPRESSION: 1. No acute intra-abdominal process. 2. Multiple scattered lucent lesions in the visualized spine and pelvis, consistent with history of multiple myeloma. 3.  Aortic Atherosclerosis (ICD10-I70.0). Electronically Signed   By: Titus Dubin M.D.   On: 07/31/2022 13:39   CT HEAD WO CONTRAST (5MM)  Result Date: 07/31/2022 CLINICAL DATA:  Mental status change, persistent or worsening EXAM: CT HEAD WITHOUT  CONTRAST TECHNIQUE: Contiguous axial images were obtained from the base of the skull through the vertex without intravenous contrast. RADIATION DOSE REDUCTION: This exam was performed according to the departmental dose-optimization program which includes automated exposure control, adjustment of the mA and/or kV according to patient size and/or use of iterative reconstruction technique. COMPARISON:  CT head 04/16/2021. FINDINGS: Brain: No evidence of acute infarction, hemorrhage, hydrocephalus, extra-axial collection or mass lesion/mass effect. Vascular: Calcific atherosclerosis. Skull: No acute fracture.  Hyperostosis frontalis. Sinuses/Orbits: Clear visualized sinuses. No acute orbital findings. Other: Cerumen in bilateral EACs.  No mastoid effusions. IMPRESSION: No evidence of acute intracranial abnormality. Electronically Signed   By: Margaretha Sheffield M.D.   On: 07/31/2022 13:21   US Abdomen Limited RUQ (LIVER/GB)  Result Date: 07/31/2022 CLINICAL DATA:  Elevated ALT level. EXAM: ULTRASOUND ABDOMEN LIMITED RIGHT UPPER QUADRANT COMPARISON:  September 25, 2010. FINDINGS: Gallbladder: There is the suggestion of a large stone or tumefactive sludge  within the gallbladder lumen. No gallbladder wall thickening or pericholecystic fluid is noted. No sonographic Murphy's sign is noted. Common bile duct: Diameter: 4 mm which is within normal limits. Liver: No focal lesion identified. Increased echogenicity of hepatic parenchyma is noted suggesting hepatic steatosis. Portal vein is patent on color Doppler imaging with normal direction of blood flow towards the liver. Other: None. IMPRESSION: There is either a large gallstone or tumefactive sludge seen within the gallbladder lumen. No definite gallbladder wall thickening or sonographic Murphy's sign is noted. Probable hepatic steatosis. Electronically Signed   By: Marijo Conception M.D.   On: 07/31/2022 11:14   DG Chest Port 1 View  Result Date: 07/31/2022 CLINICAL  DATA:  Pt exam order states amsQuestionable sepsis - evaluate for abnormality EXAM: PORTABLE CHEST 1 VIEW COMPARISON:  None Available. FINDINGS: Normal mediastinum and cardiac silhouette. Normal pulmonary vasculature. No evidence of effusion, infiltrate, or pneumothorax. No acute bony abnormality. IMPRESSION: No acute cardiopulmonary process. Electronically Signed   By: Suzy Bouchard M.D.   On: 07/31/2022 09:05      Subjective: Patient is doing well today.  Husband reports that she is eating.  Discharge Exam: Vitals:   08/09/22 1603 08/09/22 2117 08/10/22 0640 08/10/22 0907  BP: (!) 112/101 (!) 88/69 127/77 (!) 91/58  Pulse: 94 98 92 89  Resp: _0 Temp: (!) 97.5 F (36.4 C) 98.6 F (37 C) 98.4 F (36.9 C) 98.3 F (36.8 C)  TempSrc: Oral   Oral  SpO2: 97% 100% 100% 98%  Weight:      Height:        General: Pt is alert, awake, not in acute distress Cardiovascular: RRR, S1/S2 +, no rubs, no gallops Respiratory: CTA bilaterally, no wheezing, no rhonchi Abdominal: Soft, NT, ND, bowel sounds + Extremities: no edema, no cyanosis    The results of significant diagnostics from this hospitalization (including imaging, microbiology, ancillary and laboratory) are listed below for reference.     Microbiology: No results found for this or any previous visit (from the past 240 hour(s)).   Labs: BNP (last 3 results) No results for input(s): "BNP" in the last 8760 hours. Basic Metabolic Panel: Recent Labs  Lab 08/03/22 1840 08/04/22 0500 08/06/22 2242 08/09/22 0337  NA 146* 140 141 136  K 3.7 4.0 3.7 3.4*  CL 113* 111 108 107  CO2 23 24 21* 20*  GLUCOSE 252* 351* 247* 198*  BUN _1 6*  CREATININE 0.80 0.73 0.83 0.76  CALCIUM 8.7* 8.6* 9.3 8.5*   Liver Function Tests: Recent Labs  Lab 08/04/22 0500 08/06/22 2242 08/09/22 0337  AST 119* 77* 41  ALT 314* 194* 95*  ALKPHOS 147* 137* 101  BILITOT 0.5 0.7 0.5  PROT 5.3* 7.2 5.9*  ALBUMIN 2.1* 3.0* 2.4*    Recent Labs  Lab 08/06/22 2242  LIPASE 52*   Recent Labs  Lab 08/07/22 0730  AMMONIA 30   CBC: Recent Labs  Lab 08/07/22 0113 08/07/22 0730 08/08/22 0030  WBC 4.3  --  6.0  NEUTROABS 1.9  --   --   HGB 8.2* 11.9* 12.8  HCT 25.2* 36.7 39.6  MCV 88.4  --  85.9  PLT 70*  --  114*   Cardiac Enzymes: No results for input(s): "CKTOTAL", "CKMB", "CKMBINDEX", "TROPONINI" in the last 168 hours. BNP: Invalid input(s): "POCBNP" CBG: Recent Labs  Lab 08/09/22 1213 08/09/22 1720 08/09/22 2153 08/10/22 0909 08/10/22 1151  GLUCAP 232* 215* 98 197*  308*   D-Dimer No results for input(s): "DDIMER" in the last 72 hours. Hgb A1c No results for input(s): "HGBA1C" in the last 72 hours. Lipid Profile No results for input(s): "CHOL", "HDL", "LDLCALC", "TRIG", "CHOLHDL", "LDLDIRECT" in the last 72 hours. Thyroid function studies No results for input(s): "TSH", "T4TOTAL", "T3FREE", "THYROIDAB" in the last 72 hours.  Invalid input(s): "FREET3" Anemia work up No results for input(s): "VITAMINB12", "FOLATE", "FERRITIN", "TIBC", "IRON", "RETICCTPCT" in the last 72 hours. Urinalysis    Component Value Date/Time   COLORURINE YELLOW 08/07/2022 0149   APPEARANCEUR CLEAR 08/07/2022 0149   LABSPEC 1.022 08/07/2022 0149   PHURINE 5.0 08/07/2022 0149   GLUCOSEU >=500 (A) 08/07/2022 0149   GLUCOSEU NEGATIVE 09/01/2017 0847   HGBUR NEGATIVE 08/07/2022 0149   BILIRUBINUR NEGATIVE 08/07/2022 0149   KETONESUR NEGATIVE 08/07/2022 0149   PROTEINUR NEGATIVE 08/07/2022 0149   UROBILINOGEN 0.2 09/01/2017 0847   NITRITE NEGATIVE 08/07/2022 0149   LEUKOCYTESUR NEGATIVE 08/07/2022 0149   Sepsis Labs Recent Labs  Lab 08/07/22 0113 08/08/22 0030  WBC 4.3 6.0   Microbiology No results found for this or any previous visit (from the past 240 hour(s)).   Time coordinating discharge: 44mns  SIGNED:   JKathie Dike MD  [facility] 08/10/2022, 3:13 PM   If 7PM-7AM, please  contact night-coverage [email or web address]

## 2022-08-10 NOTE — TOC Transition Note (Signed)
Transition of Care [facility]) - CM/SW Discharge Note   Patient Details  Name: Victoria Gates MRN: [id number] Date of Birth: 1951-02-27  [facility] CM/SW Contact:  Carles Collet, RN Phone Number: 08/10/2022, 11:26 AM   Clinical Narrative:     Spoke w patient and spouse at the bedside.  They are in agreement for [facility] RN for DM education and wound care for S2 to sacrum.  [facility]- declined [facility]- declined [facility]- no response [facility] checking [facility]- accepted and referral completed after speaking w family. Reviewed copay and [facility] will contact spouse on Monday to schedule. No other TOC needs identified for DC  Final next level of care: [place] Barriers to Discharge: No Barriers Identified   Patient Goals and CMS Choice        Discharge Placement                       Discharge Plan and Services                          HH Arranged: RN [facility] Agency: [place] Date [place]: 08/10/22 Time [place]: 6701 Representative spoke with at [place]: [facility]  Social Determinants of Health ([place]) Interventions     Readmission Risk Interventions     No data to display

## 2022-08-12 ENCOUNTER — Encounter: Payer: Self-pay | Admitting: Family Medicine

## 2022-08-12 ENCOUNTER — Ambulatory Visit (INDEPENDENT_AMBULATORY_CARE_PROVIDER_SITE_OTHER): Payer: BC Managed Care – PPO | Admitting: Family Medicine

## 2022-08-12 VITALS — BP 116/74 | HR 113 | Resp 16 | Wt 168.0 lb

## 2022-08-12 DIAGNOSIS — E119 Type 2 diabetes mellitus without complications: Secondary | ICD-10-CM

## 2022-08-12 DIAGNOSIS — I10 Essential (primary) hypertension: Secondary | ICD-10-CM

## 2022-08-12 DIAGNOSIS — F0280 Dementia in other diseases classified elsewhere without behavioral disturbance: Secondary | ICD-10-CM

## 2022-08-12 DIAGNOSIS — E1165 Type 2 diabetes mellitus with hyperglycemia: Secondary | ICD-10-CM

## 2022-08-12 DIAGNOSIS — D649 Anemia, unspecified: Secondary | ICD-10-CM | POA: Diagnosis not present

## 2022-08-12 DIAGNOSIS — G3109 Other frontotemporal dementia: Secondary | ICD-10-CM

## 2022-08-12 DIAGNOSIS — G9341 Metabolic encephalopathy: Secondary | ICD-10-CM

## 2022-08-12 DIAGNOSIS — E785 Hyperlipidemia, unspecified: Secondary | ICD-10-CM

## 2022-08-12 LAB — COMPREHENSIVE METABOLIC PANEL
ALT: 78 U/L — ABNORMAL HIGH (ref 0–35)
AST: 43 U/L — ABNORMAL HIGH (ref 0–37)
Albumin: 3.5 g/dL (ref 3.5–5.2)
Alkaline Phosphatase: 111 U/L (ref 39–117)
BUN: 15 mg/dL (ref 6–23)
CO2: 21 mEq/L (ref 19–32)
Calcium: 9.2 mg/dL (ref 8.4–10.5)
Chloride: 102 mEq/L (ref 96–112)
Creatinine, Ser: 0.84 mg/dL (ref 0.40–1.20)
GFR: 69.73 mL/min (ref >60.00)
Glucose, Bld: 353 mg/dL — ABNORMAL HIGH (ref 70–99)
Potassium: 3.9 mEq/L (ref 3.5–5.1)
Sodium: 136 mEq/L (ref 135–145)
Total Bilirubin: 0.4 mg/dL (ref 0.2–1.2)
Total Protein: 7.1 g/dL (ref 6.0–8.3)

## 2022-08-12 LAB — CBC WITH DIFFERENTIAL/PLATELET
Basophils Absolute: 0 10*3/uL (ref 0.0–0.1)
Basophils Relative: 0.7 % (ref 0.0–3.0)
Eosinophils Absolute: 0.1 10*3/uL (ref 0.0–0.7)
Eosinophils Relative: 1.1 % (ref 0.0–5.0)
HCT: 41.2 % (ref 36.0–46.0)
Hemoglobin: 13.3 g/dL (ref 12.0–15.0)
Lymphocytes Relative: 38.1 % (ref 12.0–46.0)
Lymphs Abs: 2.5 10*3/uL (ref 0.7–4.0)
MCHC: 32.2 g/dL (ref 30.0–36.0)
MCV: 86.6 fl (ref 78.0–100.0)
Monocytes Absolute: 0.5 10*3/uL (ref 0.1–1.0)
Monocytes Relative: 7.5 % (ref 3.0–12.0)
Neutro Abs: 3.5 10*3/uL (ref 1.4–7.7)
Neutrophils Relative %: 52.6 % (ref 43.0–77.0)
Platelets: 236 10*3/uL (ref 150.0–400.0)
RBC: 4.76 Mil/uL (ref 3.87–5.11)
RDW: 14.3 % (ref 11.5–15.5)
WBC: 6.6 10*3/uL (ref 4.0–10.5)

## 2022-08-12 LAB — LIPID PANEL
Cholesterol: 170 mg/dL (ref 0–200)
HDL: 30.2 mg/dL — ABNORMAL LOW (ref >39.00)
LDL Cholesterol: 103 mg/dL — ABNORMAL HIGH (ref 0–99)
NonHDL: 140.1
Total CHOL/HDL Ratio: 6
Triglycerides: 188 mg/dL — ABNORMAL HIGH (ref 0.0–149.0)
VLDL: 37.6 mg/dL (ref 0.0–40.0)

## 2022-08-12 MED ORDER — SITAGLIPTIN PHOSPHATE 50 MG PO TABS: 50.0000 mg | ORAL_TABLET | Freq: Every day | ORAL | 3 refills | Status: DC

## 2022-08-12 MED ORDER — MIRTAZAPINE 30 MG PO TBDP: 30.0000 mg | ORAL_TABLET | Freq: Every day | ORAL | 3 refills | Status: DC

## 2022-08-12 NOTE — Assessment & Plan Note (Signed)
Lab Results  Component Value Date   HGBA1C 9.1 (H) 08/01/2022

## 2022-08-12 NOTE — Assessment & Plan Note (Signed)
Encourage heart healthy diet such as MIND or DASH diet, increase exercise, avoid trans fats, simple carbohydrates and processed foods, consider a krill or fish or flaxseed oil cap daily.  °

## 2022-08-12 NOTE — Patient Instructions (Signed)

## 2022-08-12 NOTE — Assessment & Plan Note (Signed)
Recheck labs 

## 2022-08-12 NOTE — Assessment & Plan Note (Signed)
F/u neuro  

## 2022-08-12 NOTE — Assessment & Plan Note (Signed)
Well controlled, no changes to meds. Encouraged heart healthy diet such as the DASH diet and exercise as tolerated.  °

## 2022-08-12 NOTE — Progress Notes (Addendum)
Subjective:   By signing my name below, I, Victoria Gates, attest that this documentation has been prepared under the direction and in the presence of Ann Held DO 08/12/2022   Patient ID: Victoria Gates, female    DOB: August 07, 1951, 71 y.o.   MRN: [id number]  Chief Complaint  Patient presents with   Hospitalization Follow-up    HPI Patient is in today for a hospital follow up. She is accompanied by her husband.  He is requesting a refill of 50 mg of Januvia and 30 mg of Mirtazapine for the pt.   Since her ED visit on 08/07/2022, her husband states that she is eating well. Homehealth is scheduled to contact her on 08/13/2022. She does not respond to daily monitoring well. Her husband states that she tends to pull out her tubes/monitors. Her husband reports that she is receiving 24 hr help at home. Help started around the 1st of this year. During her time in the ED, she denied of any pain.  He wants to wait for her to receive an influenza vaccine. He is planning for her to receive one next week.   Past Medical History:  Diagnosis Date   Allergic rhinitis 01/02/2009   Bradycardia 04/29/2016   Cervical Radiculopathy 02/01/2010   Left side   Chest pain 04/15/2012   Ex MV: Ex time 10 mins, EF 63%, no ischemia/infarct. Occas PAC's/PVC's.   Diabetes mellitus, type II 03/18/2008   Encounter for antineoplastic chemotherapy 04/29/2016   Esophageal Stricture 04/15/2007   s/p dilitation   GERD (gastroesophageal reflux disease) 09/21/2010   Herpes zoster 11/05/2010   Hiatal Hernia 04/15/2007   History of colonic polyps 04/24/2007   Hyperplastic only   Hypercholesterolemia 07/03/2009   Hypokalemia 11/30/2015   Iron deficiency anemia 04/24/2007   Multiple myeloma ([place]) 02/2014   Osteoporosis 04/24/2007    Past Surgical History:  Procedure Laterality Date   ESOPHAGOGASTRODUODENOSCOPY  04/15/2007   TUBAL LIGATION      Family History  Problem Relation Age of Onset   Brain  cancer Mother    Heart disease Mother    Lung cancer Father    Heart disease Father    Diabetes Sister    Heart disease Sister    Heart attack Sister    ALS Sister    Diabetes Brother    Heart disease Brother    Cancer Neg Hx        No FH of Colon Cancer   Colon cancer Neg Hx    Esophageal cancer Neg Hx    Rectal cancer Neg Hx    Stomach cancer Neg Hx     Social History   Socioeconomic History   Marital status: Married    Spouse name: Not on file   Number of children: Not on file   Years of education: Not on file   Highest education level: Not on file  Occupational History   Occupation: MAIL Armed forces operational officer: [address]  Tobacco Use   Smoking status: Never   Smokeless tobacco: Never  Vaping Use   Vaping Use: Never used  Substance and Sexual Activity   Alcohol use: No   Drug use: No   Sexual activity: Yes  Other Topics Concern   Not on file  Social History Narrative   Lives in [place] with spouse.   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   Right Handed   Social Determinants of Health   Financial Resource Strain:  Not on file  Food Insecurity: No Food Insecurity (08/07/2022)   Hunger Vital Sign    Worried About Running Out of Food in the Last Year: Never true    Ran Out of Food in the Last Year: Never true  Transportation Needs: No Transportation Needs (08/07/2022)   PRAPARE - Hydrologist (Medical): No    Lack of Transportation (Non-Medical): No  Physical Activity: Not on file  Stress: Not on file  Social Connections: Not on file  Intimate Partner Violence: Not on file    Outpatient Medications Prior to Visit  Medication Sig Dispense Refill   blood glucose meter kit and supplies KIT Dispense based on patient and insurance preference. Use daily to check blood sugar. 1 each 0   docusate sodium (COLACE) 100 MG capsule Take 200 mg by mouth daily.     feeding supplement, ENSURE COMPLETE, (ENSURE COMPLETE) LIQD  Take 237 mLs by mouth 3 (three) times daily between meals.     lansoprazole (PREVACID) 30 MG capsule Take 30 mg by mouth daily with breakfast.      Multiple Vitamin (MULTIVITAMIN WITH MINERALS) TABS tablet Take 1 tablet by mouth daily.     Pitavastatin Calcium 4 MG TABS Take 1 tablet (4 mg total) by mouth daily. 90 tablet 3   rivastigmine (EXELON) 1.5 MG capsule Take 1 capsule (1.5 mg total) by mouth 2 (two) times daily. (Patient taking differently: Take 1.5 mg by mouth 2 (two) times daily. 0600 and 1200) 180 capsule 3   mirtazapine (REMERON SOL-TAB) 30 MG disintegrating tablet Take 1 tablet (30 mg total) by mouth at bedtime. 30 tablet 0   sitaGLIPtin (JANUVIA) 50 MG tablet Take 1 tablet (50 mg total) by mouth daily. 30 tablet 0   No facility-administered medications prior to visit.    Allergies  Allergen Reactions   Atorvastatin     REACTION: myalgias   Rosuvastatin     REACTION: myalgias   Sulfonamide Derivatives     Unknown reaction     Review of Systems  Constitutional:  Negative for fever.  HENT:  Negative for congestion.   Eyes:  Negative for blurred vision.  Respiratory:  Negative for cough.   Cardiovascular:  Negative for chest pain and palpitations.  Gastrointestinal:  Negative for vomiting.  Musculoskeletal:  Negative for back pain.  Skin:  Negative for rash.  Neurological:  Negative for loss of consciousness and headaches.  Psychiatric/Behavioral:  Positive for memory loss. The patient is nervous/anxious.        Objective:    Physical Exam Vitals and nursing note reviewed.  Constitutional:      General: She is not in acute distress.    Appearance: Normal appearance. She is not ill-appearing.  HENT:     Head: Normocephalic and atraumatic.     Right Ear: External ear normal.     Left Ear: External ear normal.  Eyes:     Extraocular Movements: Extraocular movements intact.     Pupils: Pupils are equal, round, and reactive to light.  Cardiovascular:     Rate  and Rhythm: Normal rate and regular rhythm.     Heart sounds: Normal heart sounds. No murmur heard.    No gallop.  Pulmonary:     Effort: Pulmonary effort is normal. No respiratory distress.     Breath sounds: Normal breath sounds. No wheezing or rales.  Skin:    General: Skin is warm and dry.  Neurological:  Mental Status: She is alert. She is disoriented.  Psychiatric:        Mood and Affect: Mood is elated. Affect is inappropriate.        Behavior: Behavior is uncooperative.        Thought Content: Thought content does not include homicidal or suicidal ideation. Thought content does not include homicidal or suicidal plan.        Judgment: Judgment normal.     BP 116/74   Pulse (!) 113   Resp 16   Wt 168 lb (76.2 kg)   SpO2 95%   BMI 25.54 kg/m  Wt Readings from Last 3 Encounters:  08/12/22 168 lb (76.2 kg)  08/07/22 168 lb 14 oz (76.6 kg)  08/03/22 170 lb 3.1 oz (77.2 kg)    Diabetic Foot Exam - Simple   No data filed    Lab Results  Component Value Date   WBC 6.0 08/08/2022   HGB 12.8 08/08/2022   HCT 39.6 08/08/2022   PLT 114 (L) 08/08/2022   GLUCOSE 198 (H) 08/09/2022   CHOL 187 04/16/2022   TRIG 307.0 (H) 04/16/2022   HDL 34.10 (L) 04/16/2022   LDLDIRECT 113.0 04/16/2022   LDLCALC 83 01/14/2022   ALT 95 (H) 08/09/2022   AST 41 08/09/2022   NA 136 08/09/2022   K 3.4 (L) 08/09/2022   CL 107 08/09/2022   CREATININE 0.76 08/09/2022   BUN 6 (L) 08/09/2022   CO2 20 (L) 08/09/2022   TSH 4.49 12/08/2018   INR 1.1 08/07/2022   HGBA1C 9.1 (H) 08/01/2022   MICROALBUR <0.7 09/01/2017    Lab Results  Component Value Date   TSH 4.49 12/08/2018   Lab Results  Component Value Date   WBC 6.0 08/08/2022   HGB 12.8 08/08/2022   HCT 39.6 08/08/2022   MCV 85.9 08/08/2022   PLT 114 (L) 08/08/2022   Lab Results  Component Value Date   NA 136 08/09/2022   K 3.4 (L) 08/09/2022   CHLORIDE 106 08/13/2017   CO2 20 (L) 08/09/2022   GLUCOSE 198 (H)  08/09/2022   BUN 6 (L) 08/09/2022   CREATININE 0.76 08/09/2022   BILITOT 0.5 08/09/2022   ALKPHOS 101 08/09/2022   AST 41 08/09/2022   ALT 95 (H) 08/09/2022   PROT 5.9 (L) 08/09/2022   ALBUMIN 2.4 (L) 08/09/2022   CALCIUM 8.5 (L) 08/09/2022   ANIONGAP 9 08/09/2022   EGFR >60 08/13/2017   GFR 62.66 04/16/2022   Lab Results  Component Value Date   CHOL 187 04/16/2022   Lab Results  Component Value Date   HDL 34.10 (L) 04/16/2022   Lab Results  Component Value Date   LDLCALC 83 01/14/2022   Lab Results  Component Value Date   TRIG 307.0 (H) 04/16/2022   Lab Results  Component Value Date   CHOLHDL 5 04/16/2022   Lab Results  Component Value Date   HGBA1C 9.1 (H) 08/01/2022       Assessment & Plan:   Problem List Items Addressed This Visit       Unprioritized   Hyperlipidemia    Encourage heart healthy diet such as MIND or DASH diet, increase exercise, avoid trans fats, simple carbohydrates and processed foods, consider a krill or fish or flaxseed oil cap daily.        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 Encouraged heart healthy diet such as the DASH diet and exercise as tolerated.        Diabetes mellitus type II, non insulin dependent ([place])    Lab Results  Component Value Date   HGBA1C 9.1 (H) 08/01/2022         Relevant Medications   sitaGLIPtin (JANUVIA) 50 MG tablet   Anemia    Recheck labs       Relevant Orders   CBC with Differential/Platelet   Comprehensive metabolic panel   Lipid panel   Acute metabolic encephalopathy    Recheck labs today      Other Visit Diagnoses     Uncontrolled type 2 diabetes mellitus with hyperglycemia ([place])    -  Primary   Relevant Medications   mirtazapine (REMERON SOL-TAB) 30 MG disintegrating tablet   sitaGLIPtin (JANUVIA) 50 MG tablet   Other  Relevant Orders   CBC with Differential/Platelet   Comprehensive metabolic panel   Lipid panel      Meds ordered this encounter  Medications   mirtazapine (REMERON SOL-TAB) 30 MG disintegrating tablet    Sig: Take 1 tablet (30 mg total) by mouth at bedtime.    Dispense:  90 tablet    Refill:  3   sitaGLIPtin (JANUVIA) 50 MG tablet    Sig: Take 1 tablet (50 mg total) by mouth daily.    Dispense:  90 tablet    Refill:  3    I, Ann Held, DO, personally preformed the services described in this documentation.  All medical record entries made by the scribe were at my direction and in my presence.  I have reviewed the chart and discharge instructions (if applicable) and agree that the record reflects my personal performance and is accurate and complete. 08/12/2022   I,Amber Collins,acting as a scribe for Ann Held, DO.,have documented all relevant documentation on the behalf of Ann Held, DO,as directed by  Ann Held, DO while in the presence of Ann Held, DO.    Ann Held, DO

## 2022-08-12 NOTE — Assessment & Plan Note (Signed)
Recheck labs today. 

## 2022-08-17 DIAGNOSIS — G3109 Other frontotemporal dementia: Secondary | ICD-10-CM | POA: Diagnosis not present

## 2022-08-17 DIAGNOSIS — D649 Anemia, unspecified: Secondary | ICD-10-CM | POA: Diagnosis not present

## 2022-08-17 DIAGNOSIS — L8990 Pressure ulcer of unspecified site, unspecified stage: Secondary | ICD-10-CM | POA: Diagnosis not present

## 2022-08-21 ENCOUNTER — Telehealth: Payer: Self-pay | Admitting: Family Medicine

## 2022-08-21 NOTE — Telephone Encounter (Signed)
Caller/Agency: [facility]) Callback Number: [phone] Requesting OT/PT/Skilled Nursing/Social Work/Speech Therapy: Skilled Nursing, Speech Therapy for cognitive issue Frequency: 1 w 3 for O&A on SN, Evaluation for ST

## 2022-08-21 NOTE — Telephone Encounter (Signed)
Verbal given 

## 2022-08-27 DIAGNOSIS — G3109 Other frontotemporal dementia: Secondary | ICD-10-CM | POA: Diagnosis not present

## 2022-08-27 DIAGNOSIS — D649 Anemia, unspecified: Secondary | ICD-10-CM | POA: Diagnosis not present

## 2022-08-27 DIAGNOSIS — L8990 Pressure ulcer of unspecified site, unspecified stage: Secondary | ICD-10-CM | POA: Diagnosis not present

## 2022-08-28 DIAGNOSIS — L8990 Pressure ulcer of unspecified site, unspecified stage: Secondary | ICD-10-CM | POA: Diagnosis not present

## 2022-08-28 DIAGNOSIS — G3109 Other frontotemporal dementia: Secondary | ICD-10-CM | POA: Diagnosis not present

## 2022-08-28 DIAGNOSIS — D649 Anemia, unspecified: Secondary | ICD-10-CM | POA: Diagnosis not present

## 2022-09-05 ENCOUNTER — Other Ambulatory Visit: Payer: Self-pay | Admitting: Neurology

## 2022-09-05 ENCOUNTER — Other Ambulatory Visit: Payer: Self-pay | Admitting: Family Medicine

## 2022-09-05 DIAGNOSIS — F418 Other specified anxiety disorders: Secondary | ICD-10-CM

## 2022-09-06 ENCOUNTER — Other Ambulatory Visit (HOSPITAL_BASED_OUTPATIENT_CLINIC_OR_DEPARTMENT_OTHER): Payer: Self-pay

## 2022-09-06 ENCOUNTER — Telehealth: Payer: Self-pay | Admitting: Family Medicine

## 2022-09-06 DIAGNOSIS — E1165 Type 2 diabetes mellitus with hyperglycemia: Secondary | ICD-10-CM

## 2022-09-06 MED ORDER — SITAGLIPTIN PHOSPHATE 50 MG PO TABS
50.0000 mg | ORAL_TABLET | Freq: Every day | ORAL | 3 refills | Status: DC
  Filled 2022-09-06: qty 90, 90d supply, fill #0

## 2022-09-06 NOTE — Telephone Encounter (Signed)
Januvia sent downstairs.

## 2022-09-06 NOTE — Telephone Encounter (Signed)
Patient's husband called to advise that Januvia is out of stock at her pharmacy and he wanted to know if we had samples or if it could be sent to the [facility] downstairs. Please call to advise.

## 2022-09-10 ENCOUNTER — Other Ambulatory Visit: Payer: BC Managed Care – PPO

## 2022-09-10 ENCOUNTER — Other Ambulatory Visit: Payer: Self-pay

## 2022-09-10 ENCOUNTER — Inpatient Hospital Stay: Payer: BC Managed Care – PPO | Attending: Internal Medicine

## 2022-09-10 DIAGNOSIS — C9000 Multiple myeloma not having achieved remission: Secondary | ICD-10-CM | POA: Insufficient documentation

## 2022-09-10 DIAGNOSIS — Z9484 Stem cells transplant status: Secondary | ICD-10-CM | POA: Insufficient documentation

## 2022-09-10 DIAGNOSIS — C9001 Multiple myeloma in remission: Secondary | ICD-10-CM

## 2022-09-10 LAB — CMP (CANCER CENTER ONLY)
ALT: 29 U/L (ref 0–44)
AST: 26 U/L (ref 15–41)
Albumin: 3.9 g/dL (ref 3.5–5.0)
Alkaline Phosphatase: 58 U/L (ref 38–126)
Anion gap: 8 (ref 5–15)
BUN: 16 mg/dL (ref 8–23)
CO2: 27 mmol/L (ref 22–32)
Calcium: 9.8 mg/dL (ref 8.9–10.3)
Chloride: 106 mmol/L (ref 98–111)
Creatinine: 0.9 mg/dL (ref 0.44–1.00)
GFR, Estimated: >60 mL/min (ref >60)
Glucose, Bld: 118 mg/dL — ABNORMAL HIGH (ref 70–99)
Potassium: 3.6 mmol/L (ref 3.5–5.1)
Sodium: 141 mmol/L (ref 135–145)
Total Bilirubin: 0.5 mg/dL (ref 0.3–1.2)
Total Protein: 7.4 g/dL (ref 6.5–8.1)

## 2022-09-10 LAB — CBC WITH DIFFERENTIAL (CANCER CENTER ONLY)
Abs Immature Granulocytes: 0 10*3/uL (ref 0.00–0.07)
Basophils Absolute: 0.1 10*3/uL (ref 0.0–0.1)
Basophils Relative: 1 %
Eosinophils Absolute: 0.2 10*3/uL (ref 0.0–0.5)
Eosinophils Relative: 3 %
HCT: 39.2 % (ref 36.0–46.0)
Hemoglobin: 12.9 g/dL (ref 12.0–15.0)
Immature Granulocytes: 0 %
Lymphocytes Relative: 52 %
Lymphs Abs: 3 10*3/uL (ref 0.7–4.0)
MCH: 28.4 pg (ref 26.0–34.0)
MCHC: 32.9 g/dL (ref 30.0–36.0)
MCV: 86.2 fL (ref 80.0–100.0)
Monocytes Absolute: 0.4 10*3/uL (ref 0.1–1.0)
Monocytes Relative: 7 %
Neutro Abs: 2.1 10*3/uL (ref 1.7–7.7)
Neutrophils Relative %: 37 %
Platelet Count: 193 10*3/uL (ref 150–400)
RBC: 4.55 MIL/uL (ref 3.87–5.11)
RDW: 14.5 % (ref 11.5–15.5)
WBC Count: 5.8 10*3/uL (ref 4.0–10.5)
nRBC: 0 % (ref 0.0–0.2)

## 2022-09-10 LAB — LACTATE DEHYDROGENASE: LDH: 114 U/L (ref 98–192)

## 2022-09-11 LAB — BETA 2 MICROGLOBULIN, SERUM: Beta-2 Microglobulin: 1.7 mg/L (ref 0.6–2.4)

## 2022-09-11 LAB — KAPPA/LAMBDA LIGHT CHAINS
Kappa free light chain: 31.3 mg/L — ABNORMAL HIGH (ref 3.3–19.4)
Kappa, lambda light chain ratio: 1.38 (ref 0.26–1.65)
Lambda free light chains: 22.6 mg/L (ref 5.7–26.3)

## 2022-09-12 ENCOUNTER — Ambulatory Visit: Payer: BC Managed Care – PPO | Admitting: Endocrinology

## 2022-09-12 DIAGNOSIS — E440 Moderate protein-calorie malnutrition: Secondary | ICD-10-CM | POA: Diagnosis not present

## 2022-09-12 DIAGNOSIS — L89322 Pressure ulcer of left buttock, stage 2: Secondary | ICD-10-CM | POA: Diagnosis not present

## 2022-09-12 DIAGNOSIS — F02811 Dementia in other diseases classified elsewhere, unspecified severity, with agitation: Secondary | ICD-10-CM | POA: Diagnosis not present

## 2022-09-12 DIAGNOSIS — E119 Type 2 diabetes mellitus without complications: Secondary | ICD-10-CM | POA: Diagnosis not present

## 2022-09-12 DIAGNOSIS — D696 Thrombocytopenia, unspecified: Secondary | ICD-10-CM | POA: Diagnosis not present

## 2022-09-12 DIAGNOSIS — D649 Anemia, unspecified: Secondary | ICD-10-CM | POA: Diagnosis not present

## 2022-09-12 DIAGNOSIS — Z7984 Long term (current) use of oral hypoglycemic drugs: Secondary | ICD-10-CM | POA: Diagnosis not present

## 2022-09-12 DIAGNOSIS — I10 Essential (primary) hypertension: Secondary | ICD-10-CM | POA: Diagnosis not present

## 2022-09-12 DIAGNOSIS — C9000 Multiple myeloma not having achieved remission: Secondary | ICD-10-CM | POA: Diagnosis not present

## 2022-09-12 DIAGNOSIS — R7989 Other specified abnormal findings of blood chemistry: Secondary | ICD-10-CM | POA: Diagnosis not present

## 2022-09-12 DIAGNOSIS — F05 Delirium due to known physiological condition: Secondary | ICD-10-CM | POA: Diagnosis not present

## 2022-09-12 DIAGNOSIS — G3109 Other frontotemporal dementia: Secondary | ICD-10-CM | POA: Diagnosis not present

## 2022-09-12 DIAGNOSIS — E7800 Pure hypercholesterolemia, unspecified: Secondary | ICD-10-CM | POA: Diagnosis not present

## 2022-09-13 ENCOUNTER — Ambulatory Visit: Payer: BC Managed Care – PPO | Admitting: Endocrinology

## 2022-09-13 LAB — IGG, IGA, IGM
IgA: 587 mg/dL — ABNORMAL HIGH (ref 64–422)
IgG (Immunoglobin G), Serum: 1523 mg/dL (ref 586–1602)
IgM (Immunoglobulin M), Srm: 46 mg/dL (ref 26–217)

## 2022-09-17 ENCOUNTER — Ambulatory Visit: Payer: BC Managed Care – PPO | Admitting: Internal Medicine

## 2022-09-17 ENCOUNTER — Inpatient Hospital Stay (HOSPITAL_BASED_OUTPATIENT_CLINIC_OR_DEPARTMENT_OTHER): Payer: BC Managed Care – PPO | Admitting: Internal Medicine

## 2022-09-17 ENCOUNTER — Other Ambulatory Visit: Payer: Self-pay

## 2022-09-17 VITALS — BP 142/118 | HR 105 | Temp 97.6°F | Resp 16 | Wt 179.1 lb

## 2022-09-17 DIAGNOSIS — L89322 Pressure ulcer of left buttock, stage 2: Secondary | ICD-10-CM | POA: Diagnosis not present

## 2022-09-17 DIAGNOSIS — C9001 Multiple myeloma in remission: Secondary | ICD-10-CM

## 2022-09-17 DIAGNOSIS — E119 Type 2 diabetes mellitus without complications: Secondary | ICD-10-CM | POA: Diagnosis not present

## 2022-09-17 DIAGNOSIS — D649 Anemia, unspecified: Secondary | ICD-10-CM

## 2022-09-17 DIAGNOSIS — G3109 Other frontotemporal dementia: Secondary | ICD-10-CM | POA: Diagnosis not present

## 2022-09-17 DIAGNOSIS — Z9484 Stem cells transplant status: Secondary | ICD-10-CM | POA: Diagnosis not present

## 2022-09-17 DIAGNOSIS — F05 Delirium due to known physiological condition: Secondary | ICD-10-CM

## 2022-09-17 DIAGNOSIS — I10 Essential (primary) hypertension: Secondary | ICD-10-CM

## 2022-09-17 DIAGNOSIS — R7989 Other specified abnormal findings of blood chemistry: Secondary | ICD-10-CM

## 2022-09-17 DIAGNOSIS — F02811 Dementia in other diseases classified elsewhere, unspecified severity, with agitation: Secondary | ICD-10-CM | POA: Diagnosis not present

## 2022-09-17 DIAGNOSIS — C9000 Multiple myeloma not having achieved remission: Secondary | ICD-10-CM | POA: Diagnosis not present

## 2022-09-17 DIAGNOSIS — Z7984 Long term (current) use of oral hypoglycemic drugs: Secondary | ICD-10-CM

## 2022-09-17 DIAGNOSIS — E7800 Pure hypercholesterolemia, unspecified: Secondary | ICD-10-CM

## 2022-09-17 DIAGNOSIS — E440 Moderate protein-calorie malnutrition: Secondary | ICD-10-CM

## 2022-09-17 DIAGNOSIS — D696 Thrombocytopenia, unspecified: Secondary | ICD-10-CM

## 2022-09-17 NOTE — Progress Notes (Signed)
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  PRIOR THERAPY: 1) Systemic chemotherapy with Carfilzomib, Cytoxan and dexamethasone. First dose on 11/15/2013. Status post 5 cycles. 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 3) Maintenance Revlimid 10 mg by mouth daily. Status post 2 months of treatment. 4) Maintenance Revlimid 15 mg by mouth daily. Status post 4 months of treatment. 5) Maintenance treatment with Revlimid 10 mg by mouth daily status post 17 months. She completed 2 years of treatment in February 2018.  CURRENT THERAPY: Observation.  INTERVAL HISTORY: Victoria Gates 71 y.o. female returns to the clinic today for follow-up visit accompanied by her husband.  The patient is feeling fine today with no concerning complaints.  She continues to have the dementia and euphoria.  She denied having any current chest pain, shortness of breath, cough or hemoptysis.  She has no nausea, vomiting, diarrhea or constipation.  She has no headache or visual changes.  She has no recent weight loss or night sweats.  She denied having any fever or chills.  She is here today for evaluation with repeat myeloma panel.  MEDICAL HISTORY: Past Medical History:  Diagnosis Date   Allergic rhinitis 01/02/2009   Bradycardia 04/29/2016   Cervical Radiculopathy 02/01/2010   Left side   Chest pain 04/15/2012   Ex MV: Ex time 10 mins, EF 63%, no ischemia/infarct. Occas PAC's/PVC's.   Diabetes mellitus, type II 03/18/2008   Encounter for antineoplastic chemotherapy 04/29/2016   Esophageal Stricture 04/15/2007   s/p dilitation   GERD (gastroesophageal reflux disease) 09/21/2010   Herpes zoster 11/05/2010   Hiatal Hernia 04/15/2007   History of colonic polyps 04/24/2007    Hyperplastic only   Hypercholesterolemia 07/03/2009   Hypokalemia 11/30/2015   Iron deficiency anemia 04/24/2007   Multiple myeloma ([place]) 02/2014   Osteoporosis 04/24/2007    ALLERGIES:  is allergic to atorvastatin, rosuvastatin, and sulfonamide derivatives.  MEDICATIONS:  Current Outpatient Medications  Medication Sig Dispense Refill   blood glucose meter kit and supplies KIT Dispense based on patient and insurance preference. Use daily to check blood sugar. 1 each 0   docusate sodium (COLACE) 100 MG capsule Take 200 mg by mouth daily.     feeding supplement, ENSURE COMPLETE, (ENSURE COMPLETE) LIQD Take 237 mLs by mouth 3 (three) times daily between meals.     lansoprazole (PREVACID) 30 MG capsule Take 30 mg by mouth daily with breakfast.      mirtazapine (REMERON SOL-TAB) 30 MG disintegrating tablet Take 1 tablet (30 mg total) by mouth at bedtime. 90 tablet 3   Multiple Vitamin (MULTIVITAMIN WITH MINERALS) TABS tablet Take 1 tablet by mouth daily.     Pitavastatin Calcium 4 MG TABS Take 1 tablet (4 mg total) by mouth daily. 90 tablet 3   rivastigmine (EXELON) 1.5 MG capsule Take 1 capsule (1.5 mg total) by mouth 2 (two) times daily. (Patient taking differently: Take 1.5 mg by mouth 2 (two) times daily. 0600 and 1200) 180 capsule 3   sitaGLIPtin (JANUVIA) 50 MG tablet Take 1 tablet (50 mg total) by mouth daily. 90 tablet 3   No current facility-administered medications for this visit.    SURGICAL HISTORY:  Past Surgical History:  Procedure Laterality Date   ESOPHAGOGASTRODUODENOSCOPY  04/15/2007  TUBAL LIGATION      REVIEW OF SYSTEMS:  A comprehensive review of systems was negative.   PHYSICAL EXAMINATION: General appearance: alert, cooperative, and no distress Head: Normocephalic, without obvious abnormality, atraumatic Neck: no adenopathy, no JVD, supple, symmetrical, trachea midline, and thyroid not enlarged, symmetric, no tenderness/mass/nodules Lymph nodes: Cervical,  supraclavicular, and axillary nodes normal. Resp: clear to auscultation bilaterally Back: symmetric, no curvature. ROM normal. No CVA tenderness. Cardio: regular rate and rhythm, S1, S2 normal, no murmur, click, rub or gallop GI: soft, non-tender; bowel sounds normal; no masses,  no organomegaly Extremities: extremities normal, atraumatic, no cyanosis or edema  ECOG PERFORMANCE STATUS: 1 - Symptomatic but completely ambulatory  Blood pressure (!) 142/118, pulse (!) 105, temperature 97.6 F (36.4 C), temperature source Temporal, resp. rate 16, weight 179 lb 1.6 oz (81.2 kg), SpO2 100 %.  LABORATORY DATA: Lab Results  Component Value Date   WBC 5.8 09/10/2022   HGB 12.9 09/10/2022   HCT 39.2 09/10/2022   MCV 86.2 09/10/2022   PLT 193 09/10/2022      Chemistry      Component Value Date/Time   NA 141 09/10/2022 0819   NA 139 08/13/2017 0816   K 3.6 09/10/2022 0819   K 3.2 (L) 08/13/2017 0816   CL 106 09/10/2022 0819   CO2 27 09/10/2022 0819   CO2 26 08/13/2017 0816   BUN 16 09/10/2022 0819   BUN 9.0 08/13/2017 0816   CREATININE 0.90 09/10/2022 0819   CREATININE 1.04 (H) 07/03/2020 0843   CREATININE 1.0 08/13/2017 0816      Component Value Date/Time   CALCIUM 9.8 09/10/2022 0819   CALCIUM 9.2 08/13/2017 0816   ALKPHOS 58 09/10/2022 0819   ALKPHOS 59 08/13/2017 0816   AST 26 09/10/2022 0819   AST 22 08/13/2017 0816   ALT 29 09/10/2022 0819   ALT 17 08/13/2017 0816   BILITOT 0.5 09/10/2022 0819   BILITOT 0.80 08/13/2017 0816       RADIOGRAPHIC STUDIES: No results found.  ASSESSMENT AND PLAN:  This is a very pleasant 71 years old African-American female with multiple myeloma, IgA subtype status post induction systemic chemotherapy with Carfilzomib, Cytoxan and dexamethasone followed by peripheral blood autologous stem cell transplant followed by 2 years maintenance of treatment with Revlimid. The patient is currently on observation and she is feeling fine with no  concerning complaints. She had repeat myeloma panel that showed no concerning findings for disease progression. I recommended for her to continue on observation with repeat myeloma panel in 6 months. She was advised to call immediately if she has any other concerning symptoms in the interval. The patient voices understanding of current disease status and treatment options and is in agreement with the current care plan. All questions were answered. The patient knows to call the clinic with any problems, questions or concerns. We can certainly see the patient much sooner if necessary.  Disclaimer: This note was dictated with voice recognition software. Similar sounding words can inadvertently be transcribed and may not be corrected upon review.

## 2022-09-30 ENCOUNTER — Other Ambulatory Visit: Payer: Self-pay | Admitting: Family Medicine

## 2022-09-30 DIAGNOSIS — E876 Hypokalemia: Secondary | ICD-10-CM

## 2022-10-09 ENCOUNTER — Other Ambulatory Visit: Payer: Self-pay | Admitting: Family Medicine

## 2022-10-14 ENCOUNTER — Other Ambulatory Visit: Payer: Self-pay

## 2022-10-14 ENCOUNTER — Telehealth: Payer: Self-pay | Admitting: Family Medicine

## 2022-10-14 MED ORDER — BLOOD GLUCOSE MONITOR KIT: PACK | 0 refills | Status: DC

## 2022-10-14 NOTE — Telephone Encounter (Signed)
Refill sent.

## 2022-10-14 NOTE — Telephone Encounter (Signed)
Prescription Request  10/14/2022  Is this a "Controlled Substance" medicine? No  LOV: 08/12/2022  What is the name of the medication or equipment?  Glucose meter lancets and test strips  Have you contacted your pharmacy to request a refill? No   Which pharmacy would you like this sent to?  [id number] [address]Phone:: [phone]:: [phone]    Patient notified that their request is being sent to the clinical staff for review and that they should receive a response within 2 business days.   Please advise at [facility][phone]

## 2022-10-15 DIAGNOSIS — R7989 Other specified abnormal findings of blood chemistry: Secondary | ICD-10-CM | POA: Diagnosis not present

## 2022-10-15 DIAGNOSIS — D696 Thrombocytopenia, unspecified: Secondary | ICD-10-CM | POA: Diagnosis not present

## 2022-10-15 DIAGNOSIS — E119 Type 2 diabetes mellitus without complications: Secondary | ICD-10-CM | POA: Diagnosis not present

## 2022-10-15 DIAGNOSIS — Z7984 Long term (current) use of oral hypoglycemic drugs: Secondary | ICD-10-CM | POA: Diagnosis not present

## 2022-10-15 DIAGNOSIS — F02811 Dementia in other diseases classified elsewhere, unspecified severity, with agitation: Secondary | ICD-10-CM | POA: Diagnosis not present

## 2022-10-15 DIAGNOSIS — C9000 Multiple myeloma not having achieved remission: Secondary | ICD-10-CM | POA: Diagnosis not present

## 2022-10-15 DIAGNOSIS — I10 Essential (primary) hypertension: Secondary | ICD-10-CM | POA: Diagnosis not present

## 2022-10-15 DIAGNOSIS — E7800 Pure hypercholesterolemia, unspecified: Secondary | ICD-10-CM | POA: Diagnosis not present

## 2022-10-15 DIAGNOSIS — G3109 Other frontotemporal dementia: Secondary | ICD-10-CM | POA: Diagnosis not present

## 2022-10-15 DIAGNOSIS — F05 Delirium due to known physiological condition: Secondary | ICD-10-CM | POA: Diagnosis not present

## 2022-10-15 DIAGNOSIS — E440 Moderate protein-calorie malnutrition: Secondary | ICD-10-CM | POA: Diagnosis not present

## 2022-10-15 DIAGNOSIS — L89322 Pressure ulcer of left buttock, stage 2: Secondary | ICD-10-CM | POA: Diagnosis not present

## 2022-10-15 DIAGNOSIS — D649 Anemia, unspecified: Secondary | ICD-10-CM | POA: Diagnosis not present

## 2022-10-25 ENCOUNTER — Other Ambulatory Visit: Payer: Self-pay | Admitting: Family Medicine

## 2022-10-25 DIAGNOSIS — E1165 Type 2 diabetes mellitus with hyperglycemia: Secondary | ICD-10-CM

## 2022-10-28 ENCOUNTER — Encounter: Payer: Self-pay | Admitting: Family Medicine

## 2022-10-28 ENCOUNTER — Ambulatory Visit (INDEPENDENT_AMBULATORY_CARE_PROVIDER_SITE_OTHER): Payer: Medicare HMO | Admitting: Family Medicine

## 2022-10-28 VITALS — BP 120/68 | Temp 97.5°F | Resp 18 | Ht 68.0 in | Wt 184.8 lb

## 2022-10-28 DIAGNOSIS — E1165 Type 2 diabetes mellitus with hyperglycemia: Secondary | ICD-10-CM

## 2022-10-28 DIAGNOSIS — Z0000 Encounter for general adult medical examination without abnormal findings: Secondary | ICD-10-CM | POA: Diagnosis not present

## 2022-10-28 DIAGNOSIS — Z23 Encounter for immunization: Secondary | ICD-10-CM

## 2022-10-28 DIAGNOSIS — G3109 Other frontotemporal dementia: Secondary | ICD-10-CM

## 2022-10-28 DIAGNOSIS — E785 Hyperlipidemia, unspecified: Secondary | ICD-10-CM | POA: Diagnosis not present

## 2022-10-28 DIAGNOSIS — F0280 Dementia in other diseases classified elsewhere without behavioral disturbance: Secondary | ICD-10-CM | POA: Diagnosis not present

## 2022-10-28 DIAGNOSIS — I10 Essential (primary) hypertension: Secondary | ICD-10-CM | POA: Diagnosis not present

## 2022-10-28 LAB — COMPREHENSIVE METABOLIC PANEL
ALT: 42 U/L — ABNORMAL HIGH (ref 0–35)
AST: 30 U/L (ref 0–37)
Albumin: 4.1 g/dL (ref 3.5–5.2)
Alkaline Phosphatase: 56 U/L (ref 39–117)
BUN: 21 mg/dL (ref 6–23)
CO2: 26 mEq/L (ref 19–32)
Calcium: 9.5 mg/dL (ref 8.4–10.5)
Chloride: 105 mEq/L (ref 96–112)
Creatinine, Ser: 0.96 mg/dL (ref 0.40–1.20)
GFR: 59.32 mL/min — ABNORMAL LOW (ref >60.00)
Glucose, Bld: 122 mg/dL — ABNORMAL HIGH (ref 70–99)
Potassium: 3.5 mEq/L (ref 3.5–5.1)
Sodium: 141 mEq/L (ref 135–145)
Total Bilirubin: 0.5 mg/dL (ref 0.2–1.2)
Total Protein: 7.6 g/dL (ref 6.0–8.3)

## 2022-10-28 LAB — CBC WITH DIFFERENTIAL/PLATELET
Basophils Absolute: 0 10*3/uL (ref 0.0–0.1)
Basophils Relative: 0.7 % (ref 0.0–3.0)
Eosinophils Absolute: 0.1 10*3/uL (ref 0.0–0.7)
Eosinophils Relative: 2 % (ref 0.0–5.0)
HCT: 38.7 % (ref 36.0–46.0)
Hemoglobin: 13 g/dL (ref 12.0–15.0)
Lymphocytes Relative: 48.4 % — ABNORMAL HIGH (ref 12.0–46.0)
Lymphs Abs: 3.3 10*3/uL (ref 0.7–4.0)
MCHC: 33.7 g/dL (ref 30.0–36.0)
MCV: 85.1 fl (ref 78.0–100.0)
Monocytes Absolute: 0.5 10*3/uL (ref 0.1–1.0)
Monocytes Relative: 7.3 % (ref 3.0–12.0)
Neutro Abs: 2.9 10*3/uL (ref 1.4–7.7)
Neutrophils Relative %: 41.6 % — ABNORMAL LOW (ref 43.0–77.0)
Platelets: 172 10*3/uL (ref 150.0–400.0)
RBC: 4.55 Mil/uL (ref 3.87–5.11)
RDW: 14 % (ref 11.5–15.5)
WBC: 6.9 10*3/uL (ref 4.0–10.5)

## 2022-10-28 LAB — LIPID PANEL
Cholesterol: 171 mg/dL (ref 0–200)
HDL: 37.8 mg/dL — ABNORMAL LOW (ref >39.00)
NonHDL: 133.4
Total CHOL/HDL Ratio: 5
Triglycerides: 292 mg/dL — ABNORMAL HIGH (ref 0.0–149.0)
VLDL: 58.4 mg/dL — ABNORMAL HIGH (ref 0.0–40.0)

## 2022-10-28 LAB — HEMOGLOBIN A1C: Hgb A1c MFr Bld: 6.8 % — ABNORMAL HIGH (ref 4.6–6.5)

## 2022-10-28 LAB — LDL CHOLESTEROL, DIRECT: Direct LDL: 97 mg/dL

## 2022-10-28 MED ORDER — GLUCOSE BLOOD VI STRP: ORAL_STRIP | 12 refills | Status: DC

## 2022-10-28 NOTE — Patient Instructions (Signed)
Preventive Care 65 Years and Older, Female Preventive care refers to lifestyle choices and visits with your health care provider that can promote health and wellness. Preventive care visits are also called wellness exams. What can I expect for my preventive care visit? Counseling Your health care provider may ask you questions about your: Medical history, including: Past medical problems. Family medical history. Pregnancy and menstrual history. History of falls. Current health, including: Memory and ability to understand (cognition). Emotional well-being. Home life and relationship well-being. Sexual activity and sexual health. Lifestyle, including: Alcohol, nicotine or tobacco, and drug use. Access to firearms. Diet, exercise, and sleep habits. Work and work environment. Sunscreen use. Safety issues such as seatbelt and bike helmet use. Physical exam Your health care provider will check your: Height and weight. These may be used to calculate your BMI (body mass index). BMI is a measurement that tells if you are at a healthy weight. Waist circumference. This measures the distance around your waistline. This measurement also tells if you are at a healthy weight and may help predict your risk of certain diseases, such as type 2 diabetes and high blood pressure. Heart rate and blood pressure. Body temperature. Skin for abnormal spots. What immunizations do I need?  Vaccines are usually given at various ages, according to a schedule. Your health care provider will recommend vaccines for you based on your age, medical history, and lifestyle or other factors, such as travel or where you work. What tests do I need? Screening Your health care provider may recommend screening tests for certain conditions. This may include: Lipid and cholesterol levels. Hepatitis C test. Hepatitis B test. HIV (human immunodeficiency virus) test. STI (sexually transmitted infection) testing, if you are at  risk. Lung cancer screening. Colorectal cancer screening. Diabetes screening. This is done by checking your blood sugar (glucose) after you have not eaten for a while (fasting). Mammogram. Talk with your health care provider about how often you should have regular mammograms. BRCA-related cancer screening. This may be done if you have a family history of breast, ovarian, tubal, or peritoneal cancers. Bone density scan. This is done to screen for osteoporosis. Talk with your health care provider about your test results, treatment options, and if necessary, the need for more tests. Follow these instructions at home: Eating and drinking  Eat a diet that includes fresh fruits and vegetables, whole grains, lean protein, and low-fat dairy products. Limit your intake of foods with high amounts of sugar, saturated fats, and salt. Take vitamin and mineral supplements as recommended by your health care provider. Do not drink alcohol if your health care provider tells you not to drink. If you drink alcohol: Limit how much you have to 0-1 drink a day. Know how much alcohol is in your drink. In the [place] one drink equals one 12 oz bottle of beer (355 mL), one 5 oz glass of wine (148 mL), or one 1 oz glass of hard liquor (44 mL). Lifestyle Brush your teeth every morning and night with fluoride toothpaste. Floss one time each day. Exercise for at least 30 minutes 5 or more days each week. Do not use any products that contain nicotine or tobacco. These products include cigarettes, chewing tobacco, and vaping devices, such as e-cigarettes. If you need help quitting, ask your health care provider. Do not use drugs. If you are sexually active, practice safe sex. Use a condom or other form of protection in order to prevent STIs. Take aspirin only as told by   your health care provider. Make sure that you understand how much to take and what form to take. Work with your health care provider to find out whether it  is safe and beneficial for you to take aspirin daily. Ask your health care provider if you need to take a cholesterol-lowering medicine (statin). Find healthy ways to manage stress, such as: Meditation, yoga, or listening to music. Journaling. Talking to a trusted person. Spending time with friends and family. Minimize exposure to UV radiation to reduce your risk of skin cancer. Safety Always wear your seat belt while driving or riding in a vehicle. Do not drive: If you have been drinking alcohol. Do not ride with someone who has been drinking. When you are tired or distracted. While texting. If you have been using any mind-altering substances or drugs. Wear a helmet and other protective equipment during sports activities. If you have firearms in your house, make sure you follow all gun safety procedures. What's next? Visit your health care provider once a year for an annual wellness visit. Ask your health care provider how often you should have your eyes and teeth checked. Stay up to date on all vaccines. This information is not intended to replace advice given to you by your health care provider. Make sure you discuss any questions you have with your health care provider. Document Revised: 03/14/2021 Document Reviewed: 03/14/2021 Elsevier Patient Education  2023 [employer]  

## 2022-10-28 NOTE — Progress Notes (Signed)
Subjective:     Victoria Gates is a 72 y.o. female and is here for a comprehensive physical exam. The patient reports no problems.  HYPERTENSION  Blood pressure range-good per pt husband Takes bp every am   Chest pain- no      Dyspnea- no Lightheadedness- no   Edema- no Other side effects - no   Medication compliance: good Low salt diet- yes   DIABETES  Blood Sugar ranges-97-under 120  Polyuria- no New Visual problems- no Hypoglycemic symptoms- no Other side effects-no Medication compliance - good Last eye exam- 2 years  Foot exam- pt not cooperative with exam   HYPERLIPIDEMIA  Medication compliance- good  RUQ pain- no  Muscle aches- no Other side effects-no    Social History   Socioeconomic History   Marital status: Married    Spouse name: Not on file   Number of children: Not on file   Years of education: Not on file   Highest education level: Not on file  Occupational History   Occupation: MAIL Armed forces operational officer: [address]  Tobacco Use   Smoking status: Never   Smokeless tobacco: Never  Vaping Use   Vaping Use: Never used  Substance and Sexual Activity   Alcohol use: No   Drug use: No   Sexual activity: Not Currently  Other Topics Concern   Not on file  Social History Narrative   Lives in [place] with spouse.   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   Right Handed   Social Determinants of Health   Financial Resource Strain: Not on file  Food Insecurity: No Food Insecurity (08/07/2022)   Hunger Vital Sign    Worried About Running Out of Food in the Last Year: Never true    Ran Out of Food in the Last Year: Never true  Transportation Needs: No Transportation Needs (08/07/2022)   PRAPARE - Hydrologist (Medical): No    Lack of Transportation (Non-Medical): No  Physical Activity: Not on file  Stress: Not on file  Social Connections: Not on file  Intimate Partner Violence: Not on file   Health  Maintenance  Topic Date Due   Diabetic kidney evaluation - Urine ACR  09/01/2018   MAMMOGRAM  10/08/2019   OPHTHALMOLOGY EXAM  03/21/2020   FOOT EXAM  09/06/2021   COLONOSCOPY (Pts 45-23yr Insurance coverage will need to be confirmed)  06/10/2022   INFLUENZA VACCINE  11/12/2022 (Originally 04/30/2022)   COVID-19 Vaccine (5 - 2023-24 season) 11/12/2022 (Originally 05/31/2022)   Zoster Vaccines- Shingrix (1 of 2) 11/12/2022 (Originally 10/24/1969)   HEMOGLOBIN A1C  01/30/2023   Diabetic kidney evaluation - eGFR measurement  09/11/2023   DTaP/Tdap/Td (3 - Td or Tdap) 09/02/2027   Pneumonia Vaccine 72 Years old  Completed   DEXA SCAN  Completed   Hepatitis C Screening  Completed   HPV VACCINES  Aged Out    The following portions of the patient's history were reviewed and updated as appropriate: She  has a past medical history of Allergic rhinitis (01/02/2009), Bradycardia (04/29/2016), Cervical Radiculopathy (02/01/2010), Chest pain (04/15/2012), Diabetes mellitus, type II (03/18/2008), Encounter for antineoplastic chemotherapy (04/29/2016), Esophageal Stricture (04/15/2007), GERD (gastroesophageal reflux disease) (09/21/2010), Herpes zoster (11/05/2010), Hiatal Hernia (04/15/2007), History of colonic polyps (04/24/2007), Hypercholesterolemia (07/03/2009), Hypokalemia (11/30/2015), Iron deficiency anemia (04/24/2007), Multiple myeloma ([facility] (02/2014), and Osteoporosis (04/24/2007). She does not have any pertinent problems on file. She  has a past surgical history  that includes Tubal ligation and Esophagogastroduodenoscopy (04/15/2007). Her family history includes ALS in her sister; Brain cancer in her mother; Diabetes in her brother and sister; Heart attack in her sister; Heart disease in her brother, father, mother, and sister; Lung cancer in her father. She  reports that she has never smoked. She has never used smokeless tobacco. She reports that she does not drink alcohol and does not use drugs. She  has a current medication list which includes the following prescription(s): blood glucose meter kit and supplies, docusate sodium, feeding supplement (ensure complete), glucose blood, lansoprazole, mirtazapine, multivitamin with minerals, pitavastatin calcium, rivastigmine, and sitagliptin. Current Outpatient Medications on File Prior to Visit  Medication Sig Dispense Refill   blood glucose meter kit and supplies KIT Dispense based on patient and insurance preference. Use daily to check blood sugar. 1 each 0   docusate sodium (COLACE) 100 MG capsule Take 200 mg by mouth daily.     feeding supplement, ENSURE COMPLETE, (ENSURE COMPLETE) LIQD Take 237 mLs by mouth 3 (three) times daily between meals.     lansoprazole (PREVACID) 30 MG capsule Take 30 mg by mouth daily with breakfast.      mirtazapine (REMERON SOL-TAB) 30 MG disintegrating tablet TAKE 1 TABLET BY MOUTH AT BEDTIME. 90 tablet 1   Multiple Vitamin (MULTIVITAMIN WITH MINERALS) TABS tablet Take 1 tablet by mouth daily.     Pitavastatin Calcium 4 MG TABS Take 1 tablet (4 mg total) by mouth daily. 90 tablet 3   rivastigmine (EXELON) 1.5 MG capsule Take 1 capsule (1.5 mg total) by mouth 2 (two) times daily. (Patient taking differently: Take 1.5 mg by mouth 2 (two) times daily. 0600 and 1200) 180 capsule 3   sitaGLIPtin (JANUVIA) 50 MG tablet Take 1 tablet (50 mg total) by mouth daily. 90 tablet 3   No current facility-administered medications on file prior to visit.   She is allergic to atorvastatin, rosuvastatin, and sulfonamide derivatives..  Review of Systems Review of Systems  Constitutional: Negative for activity change, appetite change and fatigue.  HENT: Negative for hearing loss, congestion, tinnitus and ear discharge.  dentist q80mEyes: Negative for visual disturbance (see optho q1y -- vision corrected to 20/20 with glasses).  Respiratory: Negative for cough, chest tightness and shortness of breath.   Cardiovascular: Negative for  chest pain, palpitations and leg swelling.  Gastrointestinal: Negative for abdominal pain, diarrhea, constipation and abdominal distention.  Genitourinary: Negative for urgency, frequency, decreased urine volume and difficulty urinating.  Musculoskeletal: Negative for back pain, arthralgias and gait problem.  Skin: Negative for color change, pallor and rash.  Neurological: Negative for dizziness, light-headedness, numbness and headaches.  Hematological: Negative for adenopathy. Does not bruise/bleed easily.  Psychiatric/Behavioral: Negative for suicidal ideas, confusion, sleep disturbance, self-injury, dysphoric mood, decreased concentration and agitation.      Objective:    BP 120/68   Temp (!) 97.5 F (36.4 C)   Resp 18   Ht '5\' 8"'$  (1.727 m)   Wt 184 lb 12.8 oz (83.8 kg)   BMI 28.10 kg/m  General appearance: alert, cooperative, appears stated age, and no distress Head: Normocephalic, without obvious abnormality, atraumatic Eyes: negative findings: lids and lashes normal and conjunctivae and sclerae normal Ears: normal TM's and external ear canals both ears Nose: Nares normal. Septum midline. Mucosa normal. No drainage or sinus tenderness. Throat: lips, mucosa, and tongue normal; teeth and gums normal Neck: no adenopathy, no carotid bruit, no JVD, supple, symmetrical, trachea midline, and thyroid not enlarged,  symmetric, no tenderness/mass/nodules Back: symmetric, no curvature. ROM normal. No CVA tenderness. Lungs: clear to auscultation bilaterally Heart: regular rate and rhythm, S1, S2 normal, no murmur, click, rub or gallop Abdomen: soft, non-tender; bowel sounds normal; no masses,  no organomegaly Pelvic: deferred Extremities: extremities normal, atraumatic, no cyanosis or edema Neurologic: Grossly normal    Assessment:    Healthy female exam.      Plan:    Ghm utd Check labs  See After Visit Summary for Counseling Recommendations   1. Uncontrolled type 2 diabetes  mellitus with hyperglycemia (HCC) Attempt to get labs  Lab Results  Component Value Date   HGBA1C 9.1 (H) 08/01/2022    - glucose blood test strip; Use as instructed  Dispense: 100 each; Refill: 12 - Lipid panel - CBC with Differential/Platelet - Comprehensive metabolic panel - Hemoglobin A1c - Microalbumin / creatinine urine ratio  2. Essential hypertension Well controlled, no changes to meds. Encouraged heart healthy diet such as the DASH diet and exercise as tolerated.   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 Hyperlipidemia, unspecified hyperlipidemia type Encourage heart healthy diet such as MIND or DASH diet, increase exercise, avoid trans fats, simple carbohydrates and processed foods, consider a krill or fish or flaxseed oil cap daily.   - Lipid panel - CBC with Differential/Platelet - Comprehensive metabolic panel - Hemoglobin A1c - Microalbumin / creatinine urine ratio  5. Preventative health care See above  6. Need for influenza vaccination   - Flu Vaccine QUAD High Dose(Fluad)

## 2022-10-29 NOTE — Addendum Note (Signed)
Addended by: Manuela Schwartz on: 10/29/2022 02:13 PM   Modules accepted: Orders

## 2023-02-03 ENCOUNTER — Ambulatory Visit (INDEPENDENT_AMBULATORY_CARE_PROVIDER_SITE_OTHER): Payer: Medicare HMO | Admitting: Family Medicine

## 2023-02-03 VITALS — BP 120/68 | HR 113 | Resp 18 | Ht 68.0 in | Wt 193.0 lb

## 2023-02-03 DIAGNOSIS — I10 Essential (primary) hypertension: Secondary | ICD-10-CM | POA: Diagnosis not present

## 2023-02-03 DIAGNOSIS — F0280 Dementia in other diseases classified elsewhere without behavioral disturbance: Secondary | ICD-10-CM

## 2023-02-03 DIAGNOSIS — Z7984 Long term (current) use of oral hypoglycemic drugs: Secondary | ICD-10-CM | POA: Diagnosis not present

## 2023-02-03 DIAGNOSIS — E1165 Type 2 diabetes mellitus with hyperglycemia: Secondary | ICD-10-CM | POA: Diagnosis not present

## 2023-02-03 DIAGNOSIS — E785 Hyperlipidemia, unspecified: Secondary | ICD-10-CM

## 2023-02-03 DIAGNOSIS — E1169 Type 2 diabetes mellitus with other specified complication: Secondary | ICD-10-CM

## 2023-02-03 DIAGNOSIS — G3109 Other frontotemporal dementia: Secondary | ICD-10-CM

## 2023-02-03 LAB — LIPID PANEL
Cholesterol: 171 mg/dL (ref 0–200)
HDL: 36.1 mg/dL — ABNORMAL LOW (ref >39.00)
NonHDL: 135.08
Total CHOL/HDL Ratio: 5
Triglycerides: 287 mg/dL — ABNORMAL HIGH (ref 0.0–149.0)
VLDL: 57.4 mg/dL — ABNORMAL HIGH (ref 0.0–40.0)

## 2023-02-03 LAB — COMPREHENSIVE METABOLIC PANEL
ALT: 54 U/L — ABNORMAL HIGH (ref 0–35)
AST: 36 U/L (ref 0–37)
Albumin: 4 g/dL (ref 3.5–5.2)
Alkaline Phosphatase: 64 U/L (ref 39–117)
BUN: 25 mg/dL — ABNORMAL HIGH (ref 6–23)
CO2: 26 mEq/L (ref 19–32)
Calcium: 9.4 mg/dL (ref 8.4–10.5)
Chloride: 102 mEq/L (ref 96–112)
Creatinine, Ser: 0.98 mg/dL (ref 0.40–1.20)
GFR: 57.76 mL/min — ABNORMAL LOW (ref >60.00)
Glucose, Bld: 150 mg/dL — ABNORMAL HIGH (ref 70–99)
Potassium: 3.5 mEq/L (ref 3.5–5.1)
Sodium: 140 mEq/L (ref 135–145)
Total Bilirubin: 0.5 mg/dL (ref 0.2–1.2)
Total Protein: 7.8 g/dL (ref 6.0–8.3)

## 2023-02-03 LAB — HEMOGLOBIN A1C: Hgb A1c MFr Bld: 6.8 % — ABNORMAL HIGH (ref 4.6–6.5)

## 2023-02-03 LAB — LDL CHOLESTEROL, DIRECT: Direct LDL: 106 mg/dL

## 2023-02-03 NOTE — Assessment & Plan Note (Signed)
Well controlled, no changes to meds. Encouraged heart healthy diet such as the DASH diet and exercise as tolerated.  °

## 2023-02-03 NOTE — Assessment & Plan Note (Signed)
Check labs hgba1c to be checked.  minimize simple carbs. Increase exercise as tolerated. Continue current meds  

## 2023-02-03 NOTE — Assessment & Plan Note (Signed)
Tolerating statin, encouraged heart healthy diet, avoid trans fats, minimize simple carbs and saturated fats. Increase exercise as tolerated 

## 2023-02-03 NOTE — Progress Notes (Signed)
Established Patient Office Visit  Subjective   Patient ID: Victoria Gates, female    DOB: 1951-02-20  Age: 72 y.o. MRN: [id number]  Chief Complaint  Patient presents with   Diabetes    Pt's husband states pt glucose was high over the weekend. He states it was 140-160. Her glucose was 157 this morning.    Follow-up    HPI Pt here with her husband c/o high blood sugars over the weekend.  She did drop a pill on the floor so her husband was not sure if that is why it was high. Patient Active Problem List   Diagnosis Date Noted   Uncontrolled type 2 diabetes mellitus with hyperglycemia (HCC) 02/03/2023   Pressure injury of buttock, stage 2 (HCC) 08/10/2022   Pressure injury of skin 08/08/2022   Chronic liver failure (HCC) 08/07/2022   Anemia 08/07/2022   Malnutrition of moderate degree 08/03/2022   AKI (acute kidney injury) (HCC) 08/03/2022   Acute metabolic encephalopathy 08/03/2022   Transaminitis 08/03/2022   Hypernatremia 08/02/2022   Altered mental status 08/02/2022   Lactic acidosis 08/02/2022   DKA (diabetic ketoacidosis) (HCC) 07/31/2022   Mild protein-calorie malnutrition (HCC) 01/14/2022   Preventative health care 07/12/2021   Hyperlipidemia associated with type 2 diabetes mellitus (HCC) 07/12/2021   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HCC) 04/03/2021   Pre-op examination 04/03/2021   Hyperlipidemia 07/03/2020   Mild neurocognitive disorder, 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   Bradycardia 04/29/2016   Hypokalemia 11/30/2015   Neutropenia (HCC) 05/03/2015   Syncope 09/11/2014   Bronchitis 09/08/2014   Multiple myeloma (HCC) 10/22/2013   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   Hyperproteinemia 04/14/2013   Midsternal chest pain 04/20/2012   Lipoma of shoulder 11/02/2011   Herpes zoster 11/05/2010   GERD (gastroesophageal reflux disease) 09/21/2010   Cervical Radiculopathy, Left 02/01/2010   Hypercholesterolemia 07/03/2009   Otitis Externa  07/03/2009   Cerumen impaction 07/03/2009   Myalgia 07/03/2009   Allergic rhinitis 01/02/2009   Diabetes mellitus type II, non insulin dependent (HCC) 03/18/2008   Chest pain 12/24/2007   URI 12/12/2007   Anemia-Iron Deficiency 04/24/2007   Essential hypertension 04/24/2007   Osteoporosis 04/24/2007   Esophageal Stricture 04/15/2007   Hiatal Hernia 04/15/2007   Past Medical History:  Diagnosis Date   Allergic rhinitis 01/02/2009   Bradycardia 04/29/2016   Cervical Radiculopathy 02/01/2010   Left side   Chest pain 04/15/2012   Ex MV: Ex time 10 mins, EF 63%, no ischemia/infarct. Occas PAC's/PVC's.   Diabetes mellitus, type II 03/18/2008   Encounter for antineoplastic chemotherapy 04/29/2016   Esophageal Stricture 04/15/2007   s/p dilitation   GERD (gastroesophageal reflux disease) 09/21/2010   Herpes zoster 11/05/2010   Hiatal Hernia 04/15/2007   History of colonic polyps 04/24/2007   Hyperplastic only   Hypercholesterolemia 07/03/2009   Hypokalemia 11/30/2015   Iron deficiency anemia 04/24/2007   Multiple myeloma (HCC) 02/2014   Osteoporosis 04/24/2007   Past Surgical History:  Procedure Laterality Date   ESOPHAGOGASTRODUODENOSCOPY  04/15/2007   TUBAL LIGATION     Social History   Tobacco Use   Smoking status: Never   Smokeless tobacco: Never  Vaping Use   Vaping Use: Never used  Substance Use Topics   Alcohol use: No   Drug use: No   Social History   Socioeconomic History   Marital status: Married    Spouse name: Not on file   Number of children: Not on file   Years of education:  Not on file   Highest education level: Associate degree: academic program  Occupational History   Occupation: Scientist, research (physical sciences): [address]  Tobacco Use   Smoking status: Never   Smokeless tobacco: Never  Vaping Use   Vaping Use: Never used  Substance and Sexual Activity   Alcohol use: No   Drug use: No   Sexual activity: Not Currently  Other Topics Concern    Not on file  Social History Narrative   Lives in [place] with spouse.   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   Right Handed   Social Determinants of Health   Financial Resource Strain: Low Risk  (02/03/2023)   Overall Financial Resource Strain (CARDIA)    Difficulty of Paying Living Expenses: Not hard at all  Food Insecurity: No Food Insecurity (02/03/2023)   Hunger Vital Sign    Worried About Running Out of Food in the Last Year: Never true    Ran Out of Food in the Last Year: Never true  Transportation Needs: No Transportation Needs (02/03/2023)   PRAPARE - Administrator, Civil Service (Medical): No    Lack of Transportation (Non-Medical): No  Physical Activity: Insufficiently Active (02/03/2023)   Exercise Vital Sign    Days of Exercise per Week: 1 day    Minutes of Exercise per Session: 30 min  Stress: No Stress Concern Present (02/03/2023)   [employer] of Occupational Health - Occupational Stress Questionnaire    Feeling of Stress : Not at all  Social Connections: Moderately Isolated (02/03/2023)   Social Connection and Isolation Panel [NHANES]    Frequency of Communication with Friends and Family: Once a week    Frequency of Social Gatherings with Friends and Family: Once a week    Attends Religious Services: 1 to 4 times per year    Active Member of [employer] or Organizations: No    Attends Engineer, structural: Not on file    Marital Status: Married  Catering manager Violence: Not on file   Family Status  Relation Name Status   Mother  Deceased       mother had "brain tumor"   Father  Deceased   Sister  Alive       major depression   Sister  Alive   Sister  Deceased at age 73       als   Sister  Deceased at age 26       depression protein cal malnutrition heart attack   Brother  (Not Specified)   Neg Hx  (Not Specified)   Family History  Problem Relation Age of Onset   Brain cancer Mother    Heart disease Mother    Lung  cancer Father    Heart disease Father    Diabetes Sister    Heart disease Sister    Heart attack Sister    ALS Sister    Diabetes Brother    Heart disease Brother    Cancer Neg Hx        No FH of Colon Cancer   Colon cancer Neg Hx    Esophageal cancer Neg Hx    Rectal cancer Neg Hx    Stomach cancer Neg Hx    Allergies  Allergen Reactions   Atorvastatin     REACTION: myalgias   Rosuvastatin     REACTION: myalgias   Sulfonamide Derivatives     Unknown reaction  Review of Systems  Constitutional:  Negative for fever and malaise/fatigue.  HENT:  Negative for congestion.   Eyes:  Negative for blurred vision.  Respiratory:  Negative for cough and shortness of breath.   Cardiovascular:  Negative for chest pain, palpitations and leg swelling.  Gastrointestinal:  Negative for abdominal pain, blood in stool, nausea and vomiting.  Genitourinary:  Negative for dysuria and frequency.  Musculoskeletal:  Negative for back pain and falls.  Skin:  Negative for rash.  Neurological:  Negative for dizziness, loss of consciousness and headaches.  Endo/Heme/Allergies:  Negative for environmental allergies.  Psychiatric/Behavioral:  Negative for depression. The patient is not nervous/anxious.       Objective:     BP 120/68   Pulse (!) 113   Resp 18   Ht 5\' 8"  (1.727 m)   Wt 193 lb (87.5 kg)   SpO2 98%   BMI 29.35 kg/m  BP Readings from Last 3 Encounters:  02/03/23 120/68  10/28/22 120/68  09/17/22 (!) 142/118   Wt Readings from Last 3 Encounters:  02/03/23 193 lb (87.5 kg)  10/28/22 184 lb 12.8 oz (83.8 kg)  09/17/22 179 lb 1.6 oz (81.2 kg)   SpO2 Readings from Last 3 Encounters:  02/03/23 98%  09/17/22 100%  08/12/22 95%      Physical Exam Vitals and nursing note reviewed.  Constitutional:      General: She is not in acute distress.    Appearance: She is well-developed.  HENT:     Head: Normocephalic and atraumatic.     Right Ear: External ear normal.      Left Ear: External ear normal.     Nose: Nose normal.  Eyes:     Conjunctiva/sclera: Conjunctivae normal.     Pupils: Pupils are equal, round, and reactive to light.  Neck:     Thyroid: No thyromegaly.     Vascular: No carotid bruit or JVD.  Cardiovascular:     Rate and Rhythm: Normal rate and regular rhythm.     Heart sounds: Normal heart sounds. No murmur heard. Pulmonary:     Effort: Pulmonary effort is normal. No respiratory distress.     Breath sounds: Normal breath sounds. No wheezing or rales.  Chest:     Chest wall: No tenderness.  Musculoskeletal:        General: Normal range of motion.     Cervical back: Normal range of motion and neck supple.  Neurological:     General: No focal deficit present.     Mental Status: She is alert and oriented to person, place, and time.  Psychiatric:        Behavior: Behavior normal.        Thought Content: Thought content normal.        Judgment: Judgment normal.      No results found for any visits on 02/03/23.  Last CBC Lab Results  Component Value Date   WBC 6.9 10/28/2022   HGB 13.0 10/28/2022   HCT 38.7 10/28/2022   MCV 85.1 10/28/2022   MCH 28.4 09/10/2022   RDW 14.0 10/28/2022   PLT 172.0 10/28/2022   Last metabolic panel Lab Results  Component Value Date   GLUCOSE 122 (H) 10/28/2022   NA 141 10/28/2022   K 3.5 10/28/2022   CL 105 10/28/2022   CO2 26 10/28/2022   BUN 21 10/28/2022   CREATININE 0.96 10/28/2022   GFRNONAA >60 09/10/2022   CALCIUM 9.5 10/28/2022   PHOS 3.1 08/03/2022  PROT 7.6 10/28/2022   ALBUMIN 4.1 10/28/2022   BILITOT 0.5 10/28/2022   ALKPHOS 56 10/28/2022   AST 30 10/28/2022   ALT 42 (H) 10/28/2022   ANIONGAP 8 09/10/2022   Last lipids Lab Results  Component Value Date   CHOL 171 10/28/2022   HDL 37.80 (L) 10/28/2022   LDLCALC 103 (H) 08/12/2022   LDLDIRECT 97.0 10/28/2022   TRIG 292.0 (H) 10/28/2022   CHOLHDL 5 10/28/2022   Last hemoglobin A1c Lab Results  Component Value  Date   HGBA1C 6.8 (H) 10/28/2022   Last thyroid functions Lab Results  Component Value Date   TSH 4.49 12/08/2018   Last vitamin D Lab Results  Component Value Date   VD25OH 37.30 09/01/2017   Last vitamin B12 and Folate Lab Results  Component Value Date   VITAMINB12 1,336 (H) 08/07/2022   FOLATE 19.4 08/07/2022      The 10-year ASCVD risk score (Arnett DK, et al., 2019) is: 21.8%    Assessment & Plan:   Problem List Items Addressed This Visit       Unprioritized   Uncontrolled type 2 diabetes mellitus with hyperglycemia (HCC) - Primary    Check labs  hgba1c to be checked .   minimize simple carbs. Increase exercise as tolerated. Continue current meds       Relevant Orders   Comprehensive metabolic panel   Lipid panel   Hemoglobin A1c   Microalbumin / creatinine urine ratio   POCT Urinalysis Dipstick (Automated)   Hyperlipidemia associated with type 2 diabetes mellitus (HCC)    Tolerating statin, encouraged heart healthy diet, avoid trans fats, minimize simple carbs and saturated fats. Increase exercise as tolerated       Hyperlipidemia   Relevant Orders   Comprehensive metabolic panel   Lipid panel   Hemoglobin A1c   Microalbumin / creatinine urine ratio   Frontotemporal dementia (HCC)    Stable  Per neuro       Essential hypertension    Well controlled, no changes to meds. Encouraged heart healthy diet such as the DASH diet and exercise as tolerated.        Relevant Orders   Comprehensive metabolic panel   Lipid panel   Hemoglobin A1c   Microalbumin / creatinine urine ratio    Return in about 3 months (around 05/06/2023).    Donato Schultz, DO

## 2023-02-03 NOTE — Assessment & Plan Note (Signed)
Stable Per neuro 

## 2023-02-04 ENCOUNTER — Other Ambulatory Visit (INDEPENDENT_AMBULATORY_CARE_PROVIDER_SITE_OTHER): Payer: Medicare HMO

## 2023-02-04 DIAGNOSIS — G3109 Other frontotemporal dementia: Secondary | ICD-10-CM | POA: Diagnosis not present

## 2023-02-04 DIAGNOSIS — I10 Essential (primary) hypertension: Secondary | ICD-10-CM

## 2023-02-04 DIAGNOSIS — E1165 Type 2 diabetes mellitus with hyperglycemia: Secondary | ICD-10-CM

## 2023-02-04 DIAGNOSIS — E785 Hyperlipidemia, unspecified: Secondary | ICD-10-CM

## 2023-02-04 DIAGNOSIS — F0280 Dementia in other diseases classified elsewhere without behavioral disturbance: Secondary | ICD-10-CM | POA: Diagnosis not present

## 2023-02-04 LAB — MICROALBUMIN / CREATININE URINE RATIO
Creatinine,U: 91.9 mg/dL
Microalb Creat Ratio: 0.8 mg/g (ref 0.0–30.0)
Microalb, Ur: <0.7 mg/dL (ref 0.0–1.9)

## 2023-02-15 ENCOUNTER — Other Ambulatory Visit: Payer: Self-pay | Admitting: Family Medicine

## 2023-02-15 DIAGNOSIS — E1165 Type 2 diabetes mellitus with hyperglycemia: Secondary | ICD-10-CM

## 2023-02-18 ENCOUNTER — Telehealth: Payer: Self-pay | Admitting: Internal Medicine

## 2023-02-18 NOTE — Telephone Encounter (Signed)
Called patient regarding June appointment, left a voicemail.

## 2023-02-26 ENCOUNTER — Telehealth: Payer: Self-pay | Admitting: Internal Medicine

## 2023-02-26 NOTE — Telephone Encounter (Signed)
Rescheduled appointments per patients request via left voicemail. Left voicemail for patient.

## 2023-03-11 ENCOUNTER — Other Ambulatory Visit: Payer: Self-pay

## 2023-03-11 ENCOUNTER — Other Ambulatory Visit: Payer: BC Managed Care – PPO

## 2023-03-11 ENCOUNTER — Inpatient Hospital Stay: Payer: Medicare HMO | Attending: Internal Medicine

## 2023-03-11 DIAGNOSIS — C9000 Multiple myeloma not having achieved remission: Secondary | ICD-10-CM | POA: Insufficient documentation

## 2023-03-11 DIAGNOSIS — Z9484 Stem cells transplant status: Secondary | ICD-10-CM | POA: Diagnosis not present

## 2023-03-11 DIAGNOSIS — Z79899 Other long term (current) drug therapy: Secondary | ICD-10-CM | POA: Insufficient documentation

## 2023-03-11 DIAGNOSIS — C9001 Multiple myeloma in remission: Secondary | ICD-10-CM

## 2023-03-11 LAB — CMP (CANCER CENTER ONLY)
ALT: 63 U/L — ABNORMAL HIGH (ref 0–44)
AST: 48 U/L — ABNORMAL HIGH (ref 15–41)
Albumin: 3.9 g/dL (ref 3.5–5.0)
Alkaline Phosphatase: 66 U/L (ref 38–126)
Anion gap: 8 (ref 5–15)
BUN: 20 mg/dL (ref 8–23)
CO2: 26 mmol/L (ref 22–32)
Calcium: 9.5 mg/dL (ref 8.9–10.3)
Chloride: 105 mmol/L (ref 98–111)
Creatinine: 1.06 mg/dL — ABNORMAL HIGH (ref 0.44–1.00)
GFR, Estimated: 56 mL/min — ABNORMAL LOW (ref >60)
Glucose, Bld: 261 mg/dL — ABNORMAL HIGH (ref 70–99)
Potassium: 3.3 mmol/L — ABNORMAL LOW (ref 3.5–5.1)
Sodium: 139 mmol/L (ref 135–145)
Total Bilirubin: 0.6 mg/dL (ref 0.3–1.2)
Total Protein: 8 g/dL (ref 6.5–8.1)

## 2023-03-11 LAB — CBC WITH DIFFERENTIAL (CANCER CENTER ONLY)
Abs Immature Granulocytes: 0.01 10*3/uL (ref 0.00–0.07)
Basophils Absolute: 0.1 10*3/uL (ref 0.0–0.1)
Basophils Relative: 1 %
Eosinophils Absolute: 0.1 10*3/uL (ref 0.0–0.5)
Eosinophils Relative: 2 %
HCT: 40.9 % (ref 36.0–46.0)
Hemoglobin: 13.4 g/dL (ref 12.0–15.0)
Immature Granulocytes: 0 %
Lymphocytes Relative: 49 %
Lymphs Abs: 3.6 10*3/uL (ref 0.7–4.0)
MCH: 27.8 pg (ref 26.0–34.0)
MCHC: 32.8 g/dL (ref 30.0–36.0)
MCV: 84.9 fL (ref 80.0–100.0)
Monocytes Absolute: 0.5 10*3/uL (ref 0.1–1.0)
Monocytes Relative: 6 %
Neutro Abs: 3.1 10*3/uL (ref 1.7–7.7)
Neutrophils Relative %: 42 %
Platelet Count: 177 10*3/uL (ref 150–400)
RBC: 4.82 MIL/uL (ref 3.87–5.11)
RDW: 13.4 % (ref 11.5–15.5)
WBC Count: 7.3 10*3/uL (ref 4.0–10.5)
nRBC: 0 % (ref 0.0–0.2)

## 2023-03-11 LAB — LACTATE DEHYDROGENASE: LDH: 146 U/L (ref 98–192)

## 2023-03-12 LAB — IGG, IGA, IGM
IgA: 694 mg/dL — ABNORMAL HIGH (ref 64–422)
IgG (Immunoglobin G), Serum: 1774 mg/dL — ABNORMAL HIGH (ref 586–1602)
IgM (Immunoglobulin M), Srm: 49 mg/dL (ref 26–217)

## 2023-03-12 LAB — BETA 2 MICROGLOBULIN, SERUM: Beta-2 Microglobulin: 1.7 mg/L (ref 0.6–2.4)

## 2023-03-13 LAB — KAPPA/LAMBDA LIGHT CHAINS
Kappa free light chain: 32.1 mg/L — ABNORMAL HIGH (ref 3.3–19.4)
Kappa, lambda light chain ratio: 1.2 (ref 0.26–1.65)
Lambda free light chains: 26.8 mg/L — ABNORMAL HIGH (ref 5.7–26.3)

## 2023-03-18 ENCOUNTER — Ambulatory Visit: Payer: BC Managed Care – PPO | Admitting: Internal Medicine

## 2023-03-19 ENCOUNTER — Inpatient Hospital Stay (HOSPITAL_BASED_OUTPATIENT_CLINIC_OR_DEPARTMENT_OTHER): Payer: Medicare HMO | Admitting: Internal Medicine

## 2023-03-19 ENCOUNTER — Other Ambulatory Visit: Payer: Self-pay

## 2023-03-19 VITALS — BP 116/93 | HR 124 | Temp 97.1°F | Resp 18 | Ht 68.0 in | Wt 192.0 lb

## 2023-03-19 DIAGNOSIS — Z9484 Stem cells transplant status: Secondary | ICD-10-CM | POA: Diagnosis not present

## 2023-03-19 DIAGNOSIS — Z79899 Other long term (current) drug therapy: Secondary | ICD-10-CM | POA: Diagnosis not present

## 2023-03-19 DIAGNOSIS — C9001 Multiple myeloma in remission: Secondary | ICD-10-CM | POA: Diagnosis not present

## 2023-03-19 DIAGNOSIS — C9000 Multiple myeloma not having achieved remission: Secondary | ICD-10-CM | POA: Diagnosis not present

## 2023-03-19 NOTE — Progress Notes (Signed)
[facility] Telephone:([phone]   Fax:([phone]) [phone]  OFFICE PROGRESS NOTE  Donato Schultz, DO 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  PRIOR THERAPY: 1) Systemic chemotherapy with Carfilzomib, Cytoxan and dexamethasone. First dose on 11/15/2013. Status post 5 cycles. 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 3) Maintenance Revlimid 10 mg by mouth daily. Status post 2 months of treatment. 4) Maintenance Revlimid 15 mg by mouth daily. Status post 4 months of treatment. 5) Maintenance treatment with Revlimid 10 mg by mouth daily status post 17 months. She completed 2 years of treatment in February 2018.  CURRENT THERAPY: Observation.  INTERVAL HISTORY: Victoria Gates 72 y.o. female returns to the clinic today for 6 months follow-up visit accompanied by her husband.  The patient is feeling fine today with no concerning complaints except for the cognitive issue and euphoria.  She denied having any chest pain, shortness of breath, cough or hemoptysis.  She has no nausea, vomiting, diarrhea or constipation.  She has no headache or visual changes.  She denied having any recent weight loss or night sweats.  She is here today for evaluation with repeat myeloma panel.  MEDICAL HISTORY: Past Medical History:  Diagnosis Date   Allergic rhinitis 01/02/2009   Bradycardia 04/29/2016   Cervical Radiculopathy 02/01/2010   Left side   Chest pain 04/15/2012   Ex MV: Ex time 10 mins, EF 63%, no ischemia/infarct. Occas PAC's/PVC's.   Diabetes mellitus, type II 03/18/2008   Encounter for antineoplastic chemotherapy 04/29/2016   Esophageal Stricture 04/15/2007   s/p dilitation   GERD (gastroesophageal reflux disease) 09/21/2010   Herpes zoster 11/05/2010   Hiatal Hernia 04/15/2007   History of colonic polyps 04/24/2007   Hyperplastic only    Hypercholesterolemia 07/03/2009   Hypokalemia 11/30/2015   Iron deficiency anemia 04/24/2007   Multiple myeloma (HCC) 02/2014   Osteoporosis 04/24/2007    ALLERGIES:  is allergic to atorvastatin, rosuvastatin, and sulfonamide derivatives.  MEDICATIONS:  Current Outpatient Medications  Medication Sig Dispense Refill   blood glucose meter kit and supplies KIT Dispense based on patient and insurance preference. Use daily to check blood sugar. 1 each 0   docusate sodium (COLACE) 100 MG capsule Take 200 mg by mouth daily.     feeding supplement, ENSURE COMPLETE, (ENSURE COMPLETE) LIQD Take 237 mLs by mouth 3 (three) times daily between meals.     glucose blood test strip Use as instructed 100 each 12   lansoprazole (PREVACID) 30 MG capsule Take 30 mg by mouth daily with breakfast.      mirtazapine (REMERON SOL-TAB) 30 MG disintegrating tablet TAKE 1 TABLET BY MOUTH EVERYDAY AT BEDTIME 90 tablet 1   Multiple Vitamin (MULTIVITAMIN WITH MINERALS) TABS tablet Take 1 tablet by mouth daily.     Pitavastatin Calcium 4 MG TABS TAKE 1 TABLET BY MOUTH EVERY DAY 90 tablet 3   rivastigmine (EXELON) 1.5 MG capsule Take 1 capsule (1.5 mg total) by mouth 2 (two) times daily. (Patient taking differently: Take 1.5 mg by mouth 2 (two) times daily. 0600 and 1200) 180 capsule 3   sitaGLIPtin (JANUVIA) 50 MG tablet Take 1 tablet (50 mg total) by mouth daily. 90 tablet 3   No current facility-administered medications for this visit.    SURGICAL HISTORY:  Past Surgical History:  Procedure Laterality Date   ESOPHAGOGASTRODUODENOSCOPY  04/15/2007  TUBAL LIGATION      REVIEW OF SYSTEMS:  A comprehensive review of systems was negative.   PHYSICAL EXAMINATION: General appearance: alert, cooperative, and no distress Head: Normocephalic, without obvious abnormality, atraumatic Neck: no adenopathy, no JVD, supple, symmetrical, trachea midline, and thyroid not enlarged, symmetric, no tenderness/mass/nodules Lymph  nodes: Cervical, supraclavicular, and axillary nodes normal. Resp: clear to auscultation bilaterally Back: symmetric, no curvature. ROM normal. No CVA tenderness. Cardio: regular rate and rhythm, S1, S2 normal, no murmur, click, rub or gallop GI: soft, non-tender; bowel sounds normal; no masses,  no organomegaly Extremities: extremities normal, atraumatic, no cyanosis or edema  ECOG PERFORMANCE STATUS: 1 - Symptomatic but completely ambulatory  Blood pressure (!) 116/93, pulse (!) 124, temperature (!) 97.1 F (36.2 C), temperature source Temporal, resp. rate 18, height 5\' 8"  (1.727 m), weight 192 lb (87.1 kg), SpO2 98 %.  LABORATORY DATA: Lab Results  Component Value Date   WBC 7.3 03/11/2023   HGB 13.4 03/11/2023   HCT 40.9 03/11/2023   MCV 84.9 03/11/2023   PLT 177 03/11/2023      Chemistry      Component Value Date/Time   NA 139 03/11/2023 0816   NA 139 08/13/2017 0816   K 3.3 (L) 03/11/2023 0816   K 3.2 (L) 08/13/2017 0816   CL 105 03/11/2023 0816   CO2 26 03/11/2023 0816   CO2 26 08/13/2017 0816   BUN 20 03/11/2023 0816   BUN 9.0 08/13/2017 0816   CREATININE 1.06 (H) 03/11/2023 0816   CREATININE 1.04 (H) 07/03/2020 0843   CREATININE 1.0 08/13/2017 0816      Component Value Date/Time   CALCIUM 9.5 03/11/2023 0816   CALCIUM 9.2 08/13/2017 0816   ALKPHOS 66 03/11/2023 0816   ALKPHOS 59 08/13/2017 0816   AST 48 (H) 03/11/2023 0816   AST 22 08/13/2017 0816   ALT 63 (H) 03/11/2023 0816   ALT 17 08/13/2017 0816   BILITOT 0.6 03/11/2023 0816   BILITOT 0.80 08/13/2017 0816       RADIOGRAPHIC STUDIES: No results found.  ASSESSMENT AND PLAN:  This is a very pleasant 72 years old African-American female with multiple myeloma, IgA subtype status post induction systemic chemotherapy with Carfilzomib, Cytoxan and dexamethasone followed by peripheral blood autologous stem cell transplant followed by 2 years maintenance of treatment with Revlimid. The patient has been  doing fine with no concerning complaints related to her multiple myeloma. She had repeat myeloma panel performed recently.  I discussed the lab result with the patient and her husband and no concerning findings for disease progression. I recommended for her to continue on observation with repeat myeloma panel in 6 months. She was advised to call immediately if she has any other concerning symptoms in the interval. The patient voices understanding of current disease status and treatment options and is in agreement with the current care plan. All questions were answered. The patient knows to call the clinic with any problems, questions or concerns. We can certainly see the patient much sooner if necessary.  Disclaimer: This note was dictated with voice recognition software. Similar sounding words can inadvertently be transcribed and may not be corrected upon review.

## 2023-03-21 ENCOUNTER — Encounter: Payer: Self-pay | Admitting: Family Medicine

## 2023-03-21 ENCOUNTER — Telehealth: Payer: Self-pay | Admitting: Family Medicine

## 2023-03-21 NOTE — Telephone Encounter (Signed)
FYI. Call sent to triage

## 2023-03-21 NOTE — Telephone Encounter (Signed)
Initial Comment His wifes BS 303, no symptoms Translation No Nurse Assessment Nurse: Vear Clock, RN, Elease Hashimoto Date/Time (Eastern Time): 03/21/2023 9:07:02 AM Confirm and document reason for call. If symptomatic, describe symptoms. ---Her blood sugar is 303. She takes medication by mouth. She is not having any s+s. T-97.8 , FH. B/P 124/68 Does the patient have any new or worsening symptoms? ---Yes Will a triage be completed? ---Yes Related visit to physician within the last 2 weeks? ---No Does the PT have any chronic conditions? (i.e. diabetes, asthma, this includes High risk factors for pregnancy, etc.) ---Yes List chronic conditions. ---diabetes, Is this a behavioral health or substance abuse call? ---No Guidelines Guideline Title Affirmed Question Affirmed Notes Nurse Date/Time (Eastern Time) Diabetes - High Blood Sugar [1] Blood glucose > 300 mg/dL (41.3 mmol/L) AND [2] does not use insulin (e.g., not insulindependent; most people with type 2 diabetes) [name] 03/21/2023 9:08:45 AM PLEASE NOTE: All timestamps contained within this report are represented as [place] Standard Time. CONFIDENTIALTY NOTICE: This fax transmission is intended only for the addressee. It contains information that is legally privileged, confidential or otherwise protected from use or disclosure. If you are not the intended recipient, you are strictly prohibited from reviewing, disclosing, copying using or disseminating any of this information or taking any action in reliance on or regarding this information. If you have received this fax in error, please notify us immediately by telephone so that we can arrange for its return to [place]. Phone: [phone], Toll-Free: [phone], Fax: [phone] Page: 2 of 2 Call Id: [id number] Disp. Time [name] Time) Disposition Final User 03/21/2023 9:12:05 AM Home Care Yes Vear Clock RN, Elease Hashimoto Final Disposition 03/21/2023 9:12:05 AM Home Care Yes  Vear Clock, RN, Ancil Boozer Disagree/Comply Comply Caller Understands Yes PreDisposition Call Doctor Care Advice Given Per Guideline HOME CARE: * You should be able to treat this at home. TREATMENT - LIQUIDS: * Drink at least one glass (8 oz; 240 ml) of water per hour for the next 4 hours. Reason: Adequate hydration will help lower blood sugar. * Try to drink 6 to 8 glasses of water each day. CONTINUE DIABETES PILLS: * Continue taking your diabetes pills. CALL BACK IF: * Blood glucose over 300 mg/dL (41.6 mmol/L), two or more times in a row. * Vomiting lasting over 4 hours or unable to drink any fluids * Rapid breathing occurs * You become worse CARE ADVICE given per Diabetes - High Blood Sugar (Adult) guideline

## 2023-03-21 NOTE — Telephone Encounter (Signed)
FYI: This call has been transferred to Access Nurse. Once the result note has been entered staff can address the message at that time.  Patient called in with the following symptoms:  Red Word: Blood Glucose Level of 303   Please advise at Mobile [phone] (mobile)  Message is routed to Provider Pool and [facility] Triage

## 2023-03-21 NOTE — Telephone Encounter (Signed)
Pt called. LVM to return call 

## 2023-03-24 ENCOUNTER — Emergency Department (HOSPITAL_COMMUNITY): Payer: Medicare HMO

## 2023-03-24 ENCOUNTER — Encounter: Payer: Self-pay | Admitting: *Deleted

## 2023-03-24 ENCOUNTER — Other Ambulatory Visit: Payer: Self-pay

## 2023-03-24 ENCOUNTER — Emergency Department (HOSPITAL_COMMUNITY)
Admission: EM | Admit: 2023-03-24 | Discharge: 2023-03-24 | Disposition: A | Discharge status: Home / Self Care | Payer: Medicare HMO | Attending: Emergency Medicine | Admitting: Emergency Medicine

## 2023-03-24 ENCOUNTER — Ambulatory Visit
Admission: EM | Admit: 2023-03-24 | Discharge: 2023-03-24 | Disposition: A | Discharge status: Nursing Home (Includes ICF) | Payer: Medicare HMO | Attending: Emergency Medicine | Admitting: Emergency Medicine

## 2023-03-24 DIAGNOSIS — R451 Restlessness and agitation: Secondary | ICD-10-CM | POA: Diagnosis not present

## 2023-03-24 DIAGNOSIS — R410 Disorientation, unspecified: Secondary | ICD-10-CM | POA: Diagnosis not present

## 2023-03-24 DIAGNOSIS — R739 Hyperglycemia, unspecified: Secondary | ICD-10-CM

## 2023-03-24 DIAGNOSIS — E1165 Type 2 diabetes mellitus with hyperglycemia: Secondary | ICD-10-CM | POA: Diagnosis not present

## 2023-03-24 DIAGNOSIS — R4182 Altered mental status, unspecified: Secondary | ICD-10-CM | POA: Diagnosis not present

## 2023-03-24 DIAGNOSIS — R350 Frequency of micturition: Secondary | ICD-10-CM | POA: Diagnosis not present

## 2023-03-24 DIAGNOSIS — R9431 Abnormal electrocardiogram [ECG] [EKG]: Secondary | ICD-10-CM | POA: Diagnosis not present

## 2023-03-24 DIAGNOSIS — I6782 Cerebral ischemia: Secondary | ICD-10-CM | POA: Diagnosis not present

## 2023-03-24 DIAGNOSIS — Z7984 Long term (current) use of oral hypoglycemic drugs: Secondary | ICD-10-CM | POA: Diagnosis not present

## 2023-03-24 DIAGNOSIS — F0390 Unspecified dementia without behavioral disturbance: Secondary | ICD-10-CM | POA: Diagnosis not present

## 2023-03-24 DIAGNOSIS — Z743 Need for continuous supervision: Secondary | ICD-10-CM | POA: Diagnosis not present

## 2023-03-24 HISTORY — DX: Unspecified dementia, unspecified severity, without behavioral disturbance, psychotic disturbance, mood disturbance, and anxiety: F03.90

## 2023-03-24 LAB — URINALYSIS, ROUTINE W REFLEX MICROSCOPIC
Bacteria, UA: NONE SEEN
Bilirubin Urine: NEGATIVE
Glucose, UA: >=500 mg/dL — AB
Hgb urine dipstick: NEGATIVE
Ketones, ur: NEGATIVE mg/dL
Leukocytes,Ua: NEGATIVE
Nitrite: NEGATIVE
Protein, ur: NEGATIVE mg/dL
Specific Gravity, Urine: 1.024 (ref 1.005–1.030)
pH: 5 (ref 5.0–8.0)

## 2023-03-24 LAB — HEPATIC FUNCTION PANEL
ALT: 44 U/L (ref 0–44)
AST: 27 U/L (ref 15–41)
Albumin: 3.4 g/dL — ABNORMAL LOW (ref 3.5–5.0)
Alkaline Phosphatase: 69 U/L (ref 38–126)
Bilirubin, Direct: 0.1 mg/dL (ref 0.0–0.2)
Indirect Bilirubin: 0.4 mg/dL (ref 0.3–0.9)
Total Bilirubin: 0.5 mg/dL (ref 0.3–1.2)
Total Protein: 7.5 g/dL (ref 6.5–8.1)

## 2023-03-24 LAB — CBC
HCT: 38.5 % (ref 36.0–46.0)
Hemoglobin: 12.7 g/dL (ref 12.0–15.0)
MCH: 27.4 pg (ref 26.0–34.0)
MCHC: 33 g/dL (ref 30.0–36.0)
MCV: 83 fL (ref 80.0–100.0)
Platelets: 149 10*3/uL — ABNORMAL LOW (ref 150–400)
RBC: 4.64 MIL/uL (ref 3.87–5.11)
RDW: 13.2 % (ref 11.5–15.5)
WBC: 6.9 10*3/uL (ref 4.0–10.5)
nRBC: 0 % (ref 0.0–0.2)

## 2023-03-24 LAB — CBG MONITORING, ED
Glucose-Capillary: 358 mg/dL — ABNORMAL HIGH (ref 70–99)
Glucose-Capillary: 339 mg/dL — ABNORMAL HIGH (ref 70–99)
Glucose-Capillary: 317 mg/dL — ABNORMAL HIGH (ref 70–99)
Glucose-Capillary: 298 mg/dL — ABNORMAL HIGH (ref 70–99)

## 2023-03-24 LAB — BASIC METABOLIC PANEL
Anion gap: 10 (ref 5–15)
BUN: 22 mg/dL (ref 8–23)
CO2: 23 mmol/L (ref 22–32)
Calcium: 9.1 mg/dL (ref 8.9–10.3)
Chloride: 98 mmol/L (ref 98–111)
Creatinine, Ser: 1.03 mg/dL — ABNORMAL HIGH (ref 0.44–1.00)
GFR, Estimated: 58 mL/min — ABNORMAL LOW (ref >60)
Glucose, Bld: 431 mg/dL — ABNORMAL HIGH (ref 70–99)
Potassium: 3.8 mmol/L (ref 3.5–5.1)
Sodium: 131 mmol/L — ABNORMAL LOW (ref 135–145)

## 2023-03-24 LAB — POCT FASTING CBG KUC MANUAL ENTRY: POCT Glucose (KUC): 444 mg/dL — AB (ref 70–99)

## 2023-03-24 LAB — BETA-HYDROXYBUTYRIC ACID: Beta-Hydroxybutyric Acid: 0.15 mmol/L (ref 0.05–0.27)

## 2023-03-24 MED ORDER — LACTATED RINGERS IV BOLUS
1000.0000 mL | Freq: Once | INTRAVENOUS | Status: AC
  Administered 2023-03-24: 1000 mL via INTRAVENOUS

## 2023-03-24 MED ORDER — INSULIN ASPART 100 UNIT/ML IJ SOLN
5.0000 [IU] | Freq: Once | INTRAMUSCULAR | Status: AC
  Administered 2023-03-24: 5 [IU] via SUBCUTANEOUS

## 2023-03-24 NOTE — ED Notes (Signed)
[facility] notified of request for transport.

## 2023-03-24 NOTE — ED Provider Notes (Addendum)
[facility]    CSN: [id number] Arrival date & time: 03/24/23  0830      History   Chief Complaint Chief Complaint  Patient presents with   Hyperglycemia    HPI Victoria Gates is a 72 y.o. female.   Husband brings in spouse today for confusion increased hyperglycemia for 2 weeks.  Has spoke with patient's PCP to increased her p.o. Januvia with no change.  Patient continues to have frequency.  Has been has been giving patient increased medication with no change to sugars.  Patient does have a baseline of dementia and confusion.    Past Medical History:  Diagnosis Date   Allergic rhinitis 01/02/2009   Bradycardia 04/29/2016   Cervical Radiculopathy 02/01/2010   Left side   Chest pain 04/15/2012   Ex MV: Ex time 10 mins, EF 63%, no ischemia/infarct. Occas PAC's/PVC's.   Dementia (HCC)    Diabetes mellitus, type II 03/18/2008   Encounter for antineoplastic chemotherapy 04/29/2016   Esophageal Stricture 04/15/2007   s/p dilitation   GERD (gastroesophageal reflux disease) 09/21/2010   Herpes zoster 11/05/2010   Hiatal Hernia 04/15/2007   History of colonic polyps 04/24/2007   Hyperplastic only   Hypercholesterolemia 07/03/2009   Hypokalemia 11/30/2015   Iron deficiency anemia 04/24/2007   Multiple myeloma (HCC) 02/2014   Osteoporosis 04/24/2007    Patient Active Problem List   Diagnosis Date Noted   Uncontrolled type 2 diabetes mellitus with hyperglycemia (HCC) 02/03/2023   Pressure injury of buttock, stage 2 (HCC) 08/10/2022   Pressure injury of skin 08/08/2022   Chronic liver failure (HCC) 08/07/2022   Anemia 08/07/2022   Malnutrition of moderate degree 08/03/2022   AKI (acute kidney injury) (HCC) 08/03/2022   Acute metabolic encephalopathy 08/03/2022   Transaminitis 08/03/2022   Hypernatremia 08/02/2022   Altered mental status 08/02/2022   Lactic acidosis 08/02/2022   DKA (diabetic ketoacidosis) (HCC) 07/31/2022   Mild protein-calorie  malnutrition (HCC) 01/14/2022   Preventative health care 07/12/2021   Hyperlipidemia associated with type 2 diabetes mellitus (HCC) 07/12/2021   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HCC) 04/03/2021   Pre-op examination 04/03/2021   Hyperlipidemia 07/03/2020   Mild neurocognitive disorder, 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   Bradycardia 04/29/2016   Hypokalemia 11/30/2015   Neutropenia (HCC) 05/03/2015   Syncope 09/11/2014   Bronchitis 09/08/2014   Multiple myeloma (HCC) 10/22/2013   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   Hyperproteinemia 04/14/2013   Midsternal chest pain 04/20/2012   Lipoma of shoulder 11/02/2011   Herpes zoster 11/05/2010   GERD (gastroesophageal reflux disease) 09/21/2010   Cervical Radiculopathy, Left 02/01/2010   Hypercholesterolemia 07/03/2009   Otitis Externa 07/03/2009   Cerumen impaction 07/03/2009   Myalgia 07/03/2009   Allergic rhinitis 01/02/2009   Diabetes mellitus type II, non insulin dependent (HCC) 03/18/2008   Chest pain 12/24/2007   URI 12/12/2007   Anemia-Iron Deficiency 04/24/2007   Essential hypertension 04/24/2007   Osteoporosis 04/24/2007   Esophageal Stricture 04/15/2007   Hiatal Hernia 04/15/2007    Past Surgical History:  Procedure Laterality Date   ESOPHAGOGASTRODUODENOSCOPY  04/15/2007   TUBAL LIGATION      OB History   No obstetric history on file.      Home Medications    Prior to Admission medications   Medication Sig Start Date End Date Taking? Authorizing Provider  docusate sodium (COLACE) 100 MG capsule Take 200 mg by mouth daily.   Yes [provider]  feeding supplement, ENSURE COMPLETE, (ENSURE COMPLETE)  LIQD Take 237 mLs by mouth 3 (three) times daily between meals.   Yes [provider]  lansoprazole (PREVACID) 30 MG capsule Take 30 mg by mouth daily with breakfast.    Yes [provider]  mirtazapine (REMERON SOL-TAB) 30 MG disintegrating tablet TAKE 1 TABLET  BY MOUTH EVERYDAY AT BEDTIME 02/17/23  Yes Donato Schultz, DO  Multiple Vitamin (MULTIVITAMIN WITH MINERALS) TABS tablet Take 1 tablet by mouth daily. 08/05/22  Yes Noralee Stain, DO  Pitavastatin Calcium 4 MG TABS TAKE 1 TABLET BY MOUTH EVERY DAY 02/17/23  Yes Seabron Spates R, DO  rivastigmine (EXELON) 1.5 MG capsule Take 1 capsule (1.5 mg total) by mouth 2 (two) times daily. Patient taking differently: Take 1.5 mg by mouth 2 (two) times daily. 0600 and 1200 05/15/22  Yes Patel, Donika K, DO  sitaGLIPtin (JANUVIA) 50 MG tablet Take 1 tablet (50 mg total) by mouth daily. 09/06/22  Yes Seabron Spates R, DO  glucose blood test strip Use as instructed 10/28/22   Zola Button, Grayling Congress, DO    Family History Family History  Problem Relation Age of Onset   Brain cancer Mother    Heart disease Mother    Lung cancer Father    Heart disease Father    Diabetes Sister    Heart disease Sister    Heart attack Sister    ALS Sister    Diabetes Brother    Heart disease Brother    Cancer Neg Hx        No FH of Colon Cancer   Colon cancer Neg Hx    Esophageal cancer Neg Hx    Rectal cancer Neg Hx    Stomach cancer Neg Hx     Social History Social History   Tobacco Use   Smoking status: Never   Smokeless tobacco: Never  Vaping Use   Vaping Use: Never used  Substance Use Topics   Alcohol use: No   Drug use: No     Allergies   Atorvastatin, Rosuvastatin, and Sulfonamide derivatives   Review of Systems Review of Systems  Constitutional:  Positive for activity change.  Respiratory: Negative.    Cardiovascular: Negative.   Gastrointestinal:        Unknown of abdominal pain  Genitourinary:  Positive for frequency.       Voiding excessive amounts for the past 2 days  Neurological:        History of dementia and confusion per husband this is worse than patient's baseline.  Yelling at times     Physical Exam Triage Vital Signs ED Triage Vitals  Enc Vitals Group      BP 03/24/23 0843 95/61     Pulse Rate 03/24/23 0843 (!) 105     Resp 03/24/23 0843 18     Temp 03/24/23 0851 97.9 F (36.6 C)     Temp Source 03/24/23 0851 Temporal     SpO2 03/24/23 0843 96 %     Weight --      Height --      Head Circumference --      Peak Flow --      Pain Score --      Pain Loc --      Pain Edu? --      Excl. in GC? --    No data found.  Updated Vital Signs BP 95/61   Pulse (!) 105   Temp 97.9 F (36.6 C) (Temporal)   Resp  18   SpO2 96%   Visual Acuity Right Eye Distance:   Left Eye Distance:   Bilateral Distance:    Right Eye Near:   Left Eye Near:    Bilateral Near:     Physical Exam Constitutional:      Comments: Per husband patient has a history of confusion and dementia patient currently is worse than baseline per family member.  Patient is yelling  Cardiovascular:     Rate and Rhythm: Tachycardia present.  Pulmonary:     Effort: Pulmonary effort is normal.  Abdominal:     General: Abdomen is flat.  Neurological:     Mental Status: She is disoriented.     Comments: Unable to assess patient is not cooperative or coherent.  Per husband this is worse than her normal baseline.confused  yelling at staff.  Repeating staff when asked questions.  Not answering in full sentences      UC Treatments / Results  Labs (all labs ordered are listed, but only abnormal results are displayed) Labs Reviewed  POCT FASTING CBG [facility] MANUAL ENTRY - Abnormal; Notable for the following components:      Result Value   POCT Glucose ([facility]) 444 (*)    All other components within normal limits    EKG   Radiology CT Head Wo Contrast  Result Date: 03/24/2023 CLINICAL DATA:  Mental status change. EXAM: CT HEAD WITHOUT CONTRAST TECHNIQUE: Contiguous axial images were obtained from the base of the skull through the vertex without intravenous contrast. RADIATION DOSE REDUCTION: This exam was performed according to the departmental dose-optimization program which  includes automated exposure control, adjustment of the mA and/or kV according to patient size and/or use of iterative reconstruction technique. COMPARISON:  CT examination dated July 31, 2022 FINDINGS: Brain: No evidence of acute infarction, hemorrhage, hydrocephalus, extra-axial collection or mass lesion/mass effect. Cerebral atrophy severe in bilateral temporal lobes. Patchy areas of low-attenuation of the periventricular white matter presumed chronic microvascular ischemic changes. Vascular: No hyperdense vessel or unexpected calcification. Skull: Normal. Negative for fracture or focal lesion. Sinuses/Orbits: No acute finding. Other: None. IMPRESSION: 1. No acute intracranial abnormality. 2. Cerebral atrophy severe in bilateral temporal lobes. 3. Chronic microvascular ischemic changes of the white matter. Electronically Signed   By: Larose Hires D.O.   On: 03/24/2023 12:35    Procedures Procedures (including critical care time)  Medications Ordered in UC Medications - No data to display  Initial Impression / Assessment and Plan / UC Course  I have reviewed the triage vital signs and the nursing notes.  Pertinent labs & imaging results that were available during my care of the patient were reviewed by me and considered in my medical decision making (see chart for details).     Discussed with spouse patient is confused more so than from her baseline CBGs 444 continues to elevate even after medications Discussed with family more patient needs to be transported to the emergency room for further testing Husband request for an ambulance transport for the hospital Staff attempted to obtain IV access patient is yelling and fighting with staff.  Final Clinical Impressions(s) / UC Diagnoses   Final diagnoses:  Hyperglycemia  Subacute confusional state  Frequency of urination     Discharge Instructions      Patient will need to be transported to the emergency room for further treatment of  care and evaluation. Husband has requested an ambulance transportation to the emergency room     ED Prescriptions  None    PDMP not reviewed this encounter.   Coralyn Mark, NP 03/24/23 1540    Coralyn Mark, NP 03/24/23 1547

## 2023-03-24 NOTE — Telephone Encounter (Signed)
FYI

## 2023-03-24 NOTE — ED Notes (Signed)
Attempt at obtaining IV access (no venipuncture actually performed), but pt becomes very agitated.

## 2023-03-24 NOTE — ED Provider Notes (Signed)
[place] EMERGENCY DEPARTMENT AT [facility] Provider Note   CSN: [id number] Arrival date & time: 03/24/23  1041     History Chief Complaint  Patient presents with   Hyperglycemia    Victoria Gates is a 72 y.o. female with medical history of multiple myeloma, GERD, diabetes type 2, dementia.  Patient presents to ED for evaluation of hyperglycemia.  Patient arrives with husband provides the majority of the history.  The patient husband reports that for the last 4 days the patient's had increased blood sugars in the 300s.  He reports that her sugars are typically between 120 and 140.  He states that beginning on Friday her sugars elevated and 300s.  The patient husband reports that he called her PCP who advised increasing Januvia from 50 mg to 100 mg which she has been doing for the last 2 days.  He states that despite this her sugars are still maintaining in the 300s.  Here the patient sugars 358.  The patient was seen initially at urgent care this morning and redirected to ED secondary to the patient being uncooperative.  Patient has been reports the patient is been more agitated recently.  He denies any nausea, vomiting, diarrhea at home.  He denies that the patient is indicating she is in pain.  HPI     Home Medications Prior to Admission medications   Medication Sig Start Date End Date Taking? Authorizing Provider  docusate sodium (COLACE) 100 MG capsule Take 200 mg by mouth daily.    [provider]  feeding supplement, ENSURE COMPLETE, (ENSURE COMPLETE) LIQD Take 237 mLs by mouth 3 (three) times daily between meals.    [provider]  glucose blood test strip Use as instructed 10/28/22   Zola Button, Grayling Congress, DO  lansoprazole (PREVACID) 30 MG capsule Take 30 mg by mouth daily with breakfast.     [provider]  mirtazapine (REMERON SOL-TAB) 30 MG disintegrating tablet TAKE 1 TABLET BY MOUTH EVERYDAY AT BEDTIME 02/17/23   Donato Schultz,  DO  Multiple Vitamin (MULTIVITAMIN WITH MINERALS) TABS tablet Take 1 tablet by mouth daily. 08/05/22   Noralee Stain, DO  Pitavastatin Calcium 4 MG TABS TAKE 1 TABLET BY MOUTH EVERY DAY 02/17/23   Zola Button, Grayling Congress, DO  rivastigmine (EXELON) 1.5 MG capsule Take 1 capsule (1.5 mg total) by mouth 2 (two) times daily. Patient taking differently: Take 1.5 mg by mouth 2 (two) times daily. 0600 and 1200 05/15/22   Patel, Donika K, DO  sitaGLIPtin (JANUVIA) 50 MG tablet Take 1 tablet (50 mg total) by mouth daily. 09/06/22   Donato Schultz, DO      Allergies    Atorvastatin, Rosuvastatin, and Sulfonamide derivatives    Review of Systems   Review of Systems  Unable to perform ROS: Dementia (Level 5 caveat)  All other systems reviewed and are negative.   Physical Exam Updated Vital Signs BP (!) 119/91   Pulse 91   Temp 97.9 F (36.6 C)   Resp 17   Ht 5\' 8"  (1.727 m)   Wt 88 kg   SpO2 100%   BMI 29.50 kg/m  Physical Exam Vitals and nursing note reviewed.  Constitutional:      General: She is not in acute distress.    Appearance: Normal appearance. She is not ill-appearing, toxic-appearing or diaphoretic.  HENT:     Head: Normocephalic and atraumatic.     Nose: Nose normal.  Mouth/Throat:     Mouth: Mucous membranes are moist.     Pharynx: Oropharynx is clear.  Eyes:     Extraocular Movements: Extraocular movements intact.     Conjunctiva/sclera: Conjunctivae normal.     Pupils: Pupils are equal, round, and reactive to light.  Cardiovascular:     Rate and Rhythm: Normal rate and regular rhythm.  Pulmonary:     Effort: Pulmonary effort is normal.     Breath sounds: Normal breath sounds. No wheezing.  Abdominal:     General: Abdomen is flat. Bowel sounds are normal.     Palpations: Abdomen is soft.     Tenderness: There is no abdominal tenderness.  Musculoskeletal:     Cervical back: Normal range of motion and neck supple. No tenderness.  Skin:    General: Skin is  warm and dry.     Capillary Refill: Capillary refill takes less than 2 seconds.  Neurological:     Mental Status: She is alert. Mental status is at baseline.     ED Results / Procedures / Treatments   Labs (all labs ordered are listed, but only abnormal results are displayed) Labs Reviewed  BASIC METABOLIC PANEL - Abnormal; Notable for the following components:      Result Value   Sodium 131 (*)    Glucose, Bld 431 (*)    Creatinine, Ser 1.03 (*)    GFR, Estimated 58 (*)    All other components within normal limits  CBC - Abnormal; Notable for the following components:   Platelets 149 (*)    All other components within normal limits  URINALYSIS, ROUTINE W REFLEX MICROSCOPIC - Abnormal; Notable for the following components:   Glucose, UA >=500 (*)    All other components within normal limits  HEPATIC FUNCTION PANEL - Abnormal; Notable for the following components:   Albumin 3.4 (*)    All other components within normal limits  CBG MONITORING, ED - Abnormal; Notable for the following components:   Glucose-Capillary 358 (*)    All other components within normal limits  CBG MONITORING, ED - Abnormal; Notable for the following components:   Glucose-Capillary 339 (*)    All other components within normal limits  CBG MONITORING, ED - Abnormal; Notable for the following components:   Glucose-Capillary 317 (*)    All other components within normal limits  CBG MONITORING, ED - Abnormal; Notable for the following components:   Glucose-Capillary 298 (*)    All other components within normal limits  BETA-HYDROXYBUTYRIC ACID    EKG EKG Interpretation  Date/Time:  Monday March 24 2023 11:06:09 EDT Ventricular Rate:  92 PR Interval:  140 QRS Duration: 96 QT Interval:  371 QTC Calculation: 459 R Axis:   80 Text Interpretation: Sinus rhythm Atrial premature complex Confirmed by Virgina Norfolk ([id number]) on 03/24/2023 12:51:00 PM  Radiology CT Head Wo Contrast  Result Date:  03/24/2023 CLINICAL DATA:  Mental status change. EXAM: CT HEAD WITHOUT CONTRAST TECHNIQUE: Contiguous axial images were obtained from the base of the skull through the vertex without intravenous contrast. RADIATION DOSE REDUCTION: This exam was performed according to the departmental dose-optimization program which includes automated exposure control, adjustment of the mA and/or kV according to patient size and/or use of iterative reconstruction technique. COMPARISON:  CT examination dated July 31, 2022 FINDINGS: Brain: No evidence of acute infarction, hemorrhage, hydrocephalus, extra-axial collection or mass lesion/mass effect. Cerebral atrophy severe in bilateral temporal lobes. Patchy areas of low-attenuation of the periventricular white matter  presumed chronic microvascular ischemic changes. Vascular: No hyperdense vessel or unexpected calcification. Skull: Normal. Negative for fracture or focal lesion. Sinuses/Orbits: No acute finding. Other: None. IMPRESSION: 1. No acute intracranial abnormality. 2. Cerebral atrophy severe in bilateral temporal lobes. 3. Chronic microvascular ischemic changes of the white matter. Electronically Signed   By: Larose Hires D.O.   On: 03/24/2023 12:35    Procedures Procedures   Medications Ordered in ED Medications  lactated ringers bolus 1,000 mL (0 mLs Intravenous Stopped 03/24/23 1232)  insulin aspart (novoLOG) injection 5 Units (5 Units Subcutaneous Given 03/24/23 1304)  lactated ringers bolus 1,000 mL (0 mLs Intravenous Stopped 03/24/23 1536)    ED Course/ Medical Decision Making/ A&P  Medical Decision Making Amount and/or Complexity of Data Reviewed Labs: ordered. Radiology: ordered.   72 year old female presents to ED for evaluation.  Please see HPI for further details.  On examination the patient is afebrile, nontachycardic.  Her lung sounds are clear bilaterally and she is nontoxic.  Her abdomen is soft and compressible throughout.  Neurological  examination at baseline.   Patient initial CBG 358.  CBC without cytosis or anemia.  BMP with sodium 131, glucose 431, creatinine 1.03 which is patient baseline.  Most likely pseudohyponatremia in the setting of hyperglycemia.  Anion gap 10 so doubt DKA.  Bicarb WNL.  Urinalysis unremarkable besides glucose.  Hepatic function panel unremarkable.  Beta-hydroxybutyrate acid 0.15.  Patient given 5 units of NovoLog.  CBG rechecked after 1 L LR administered and patient CBG has decreased to 317.  Will provide 1 more liter of fluid and recheck POC CBG.  Patient CBG decreased to 98 at this time.  Patient husband at bedside reports he feels comfortable taking the patient home.  The patient has been has been advised to return to the ED with any new or worsening signs or symptoms.  He has been advised to continue giving patient 100 mg of Januvia as instructed by PCP.  The patient will follow-up with her PCP this week.  Return precautions provided and patient husband voiced understanding.  All questions answered to satisfaction.  Patient stable to discharge at this time.  Final Clinical Impression(s) / ED Diagnoses Final diagnoses:  Hyperglycemia    Rx / DC Orders ED Discharge Orders     None         Al Decant, PA-C 03/24/23 1544    Virgina Norfolk, DO 03/24/23 1554

## 2023-03-24 NOTE — ED Triage Notes (Addendum)
Per spouse, her CBGs "have been going up gradually"; states spoke with PCP 3 days ago and was told to increase her Januvia - states started yesterday taking 2 pills every morning, but spouse states her CBGs continue getting higher. This morning CBG = 346. Pt has dementia.

## 2023-03-24 NOTE — Discharge Instructions (Signed)
Patient will need to be transported to the emergency room for further treatment of care and evaluation. Husband has requested an ambulance transportation to the emergency room

## 2023-03-24 NOTE — ED Notes (Signed)
EMS arrival for transport to ED. Report provided to EMS per provider.

## 2023-03-24 NOTE — Discharge Instructions (Signed)
Please return to the ED with any new or worsening signs or symptoms Continue giving patient 100 mg Januvia Continue hydrating the patient at home as you are able Please have the patient follow-up with her PCP this week for reevaluation of diabetic medications Continue monitoring blood sugars at home

## 2023-03-24 NOTE — ED Triage Notes (Signed)
Pt BIBGEMS from UC for hyperglycemia for two weeks. Polyuria for one week. Dr changed medications. Agitated with minimal cooperation  96/70 136/80  CBG 507  Hx dementia

## 2023-03-28 ENCOUNTER — Other Ambulatory Visit: Payer: Self-pay

## 2023-03-28 ENCOUNTER — Ambulatory Visit: Payer: Self-pay

## 2023-03-28 ENCOUNTER — Emergency Department (HOSPITAL_COMMUNITY)
Admission: EM | Admit: 2023-03-28 | Discharge: 2023-03-29 | Disposition: A | Discharge status: Home / Self Care | Payer: BC Managed Care – PPO | Attending: Emergency Medicine | Admitting: Emergency Medicine

## 2023-03-28 ENCOUNTER — Telehealth: Payer: Self-pay | Admitting: *Deleted

## 2023-03-28 ENCOUNTER — Encounter: Payer: Self-pay | Admitting: *Deleted

## 2023-03-28 ENCOUNTER — Other Ambulatory Visit: Payer: Self-pay | Admitting: Family Medicine

## 2023-03-28 ENCOUNTER — Encounter (HOSPITAL_COMMUNITY): Payer: Self-pay

## 2023-03-28 DIAGNOSIS — F0390 Unspecified dementia without behavioral disturbance: Secondary | ICD-10-CM | POA: Insufficient documentation

## 2023-03-28 DIAGNOSIS — E1165 Type 2 diabetes mellitus with hyperglycemia: Secondary | ICD-10-CM

## 2023-03-28 DIAGNOSIS — Z7984 Long term (current) use of oral hypoglycemic drugs: Secondary | ICD-10-CM | POA: Insufficient documentation

## 2023-03-28 DIAGNOSIS — R600 Localized edema: Secondary | ICD-10-CM | POA: Insufficient documentation

## 2023-03-28 DIAGNOSIS — R739 Hyperglycemia, unspecified: Secondary | ICD-10-CM | POA: Diagnosis not present

## 2023-03-28 DIAGNOSIS — R7989 Other specified abnormal findings of blood chemistry: Secondary | ICD-10-CM | POA: Insufficient documentation

## 2023-03-28 LAB — URINALYSIS, ROUTINE W REFLEX MICROSCOPIC
Bacteria, UA: NONE SEEN
Bilirubin Urine: NEGATIVE
Glucose, UA: >=500 mg/dL — AB
Hgb urine dipstick: NEGATIVE
Ketones, ur: NEGATIVE mg/dL
Nitrite: NEGATIVE
Protein, ur: NEGATIVE mg/dL
Specific Gravity, Urine: 1.029 (ref 1.005–1.030)
pH: 5 (ref 5.0–8.0)

## 2023-03-28 LAB — CBG MONITORING, ED: Glucose-Capillary: 522 mg/dL (ref 70–99)

## 2023-03-28 NOTE — Transitions of Care (Post Inpatient/ED Visit) (Signed)
03/28/2023  Name: Victoria Gates MRN: [id number] DOB: 11-Sep-1951  Today's TOC FU Call Status: Today's TOC FU Call Status:: Unsuccessful Call (2nd Attempt) Unsuccessful Call (2nd Attempt) Date: 03/28/23  Transition Care Management Follow-up Telephone Call Date of Discharge: 03/24/23 Discharge Facility: [facility]) Type of Discharge: Emergency Department Reason for ED Visit: Endocrine Endocrine Diagnosis: Uncontrolled Diabetes (hyperglycemia) How have you been since you were released from the hospital?: Better (per spouse/ husband: "Things are okay-- her blood sugars are still running running higher than normal but we have an appointment with PCP on Monday; everything else seems normal.  I am giving her the higher dose of Januvia like they told me to") Any questions or concerns?: Yes Patient Questions/Concerns:: per caregiver: ongoing high blood sugar readings at home- post recent ED visit on 03/24/23 Patient Questions/Concerns Addressed: Other:, Notified Provider of Patient Questions/Concerns (reinforced post-ED discharge instructions; confirmed giving higher dose of januvia; confirmed no other clinical concerns; reviewed blood sugars at home: running 300-340 with morning monitoring)  Items Reviewed: Did you receive and understand the discharge instructions provided?: Yes (thoroughly reviewed with patient's caregiver/ husband who verbalizes good understanding of same) Medications obtained,verified, and reconciled?: Yes (Medications Reviewed) (Full medication reconciliation/ review completed; no concerns or discrepancies identified; confirmed patient obtained/ is taking all newly Rx'd medications as instructed; spouse-manages medications and denies questions/ concerns around medications today) Any new allergies since your discharge?: No Dietary orders reviewed?: Yes Type of Diet Ordered:: "Eating low sugar and carbs as much as possible" Do you have support at home?: Yes People in Home:  spouse Name of Support/Comfort Primary Source: Reports independent in some self-care activities; supportive spouse assists as/ if needed/ indicated-- with "most all of" her daily care needs/ medications, etc; he works full time and takes patient to private duty paid care-giver during work hours  Medications Reviewed Today: Medications Reviewed Today     Reviewed by Michaela Corner, RN (Registered Nurse) on 03/28/23 at 1341  Med List Status: <None>   Medication Order Taking? Sig Documenting Provider Last Dose Status Informant  docusate sodium (COLACE) 100 MG capsule [id number] Yes Take 200 mg by mouth daily. [provider] Taking Active Spouse/Significant Other  feeding supplement, ENSURE COMPLETE, (ENSURE COMPLETE) LIQD [id number] Yes Take 237 mLs by mouth 3 (three) times daily between meals. [provider] Taking Active Spouse/Significant Other  glucose blood test strip [id number] Yes Use as instructed Zola Button, Grayling Congress, DO Taking Active   lansoprazole (PREVACID) 30 MG capsule [id number] Yes Take 30 mg by mouth daily with breakfast.  [provider] Taking Active Spouse/Significant Other           Med Note (GREEN, STACY L   Wed May 01, 2016  8:45 AM)    mirtazapine (REMERON SOL-TAB) 30 MG disintegrating tablet [id number] Yes TAKE 1 TABLET BY MOUTH EVERYDAY AT BEDTIME Donato Schultz, DO Taking Active   Multiple Vitamin (MULTIVITAMIN WITH MINERALS) TABS tablet [id number] Yes Take 1 tablet by mouth daily. Noralee Stain, DO Taking Active Spouse/Significant Other           Med Note Epimenio Sarin, Carlota Raspberry Mar 24, 2023  2:27 PM)    Pitavastatin Calcium 4 MG TABS [id number] Yes TAKE 1 TABLET BY MOUTH EVERY DAY Zola Button, Grayling Congress, DO Taking Active   rivastigmine (EXELON) 1.5 MG capsule [id number] Yes Take 1 capsule (1.5 mg total) by mouth 2 (two) times daily.  Patient taking differently: Take 1.5 mg by  mouth 2 (two) times daily. 0600 and 1200   Nita Sickle K, DO  Taking Active Spouse/Significant Other  sitaGLIPtin (JANUVIA) 50 MG tablet [id number] Yes Take 1 tablet (50 mg total) by mouth daily. Donato Schultz, DO Taking Active            Med Note (CRUTHIS, Carlota Raspberry Mar 24, 2023  2:27 PM)             Home Care and Equipment/Supplies: Were Home Health Services Ordered?: NA Any new equipment or medical supplies ordered?: NA  Functional Questionnaire: Do you need assistance with bathing/showering or dressing?: Yes (husband assists with all care needs as indicated) Do you need assistance with meal preparation?: Yes (husband assists with all care needs as indicated) Do you need assistance with eating?: No Do you have difficulty maintaining continence: No Do you need assistance with getting out of bed/getting out of a chair/moving?: No Do you have difficulty managing or taking your medications?: Yes (husband manages all aspects of medication administration and medical affairs)  Follow up appointments reviewed: PCP Follow-up appointment confirmed?: Yes Date of PCP follow-up appointment?: 03/31/23 Follow-up Provider: PCP Specialist Hospital Follow-up appointment confirmed?: NA (verified not indicated per ED discharging provider discharge notes) Do you need transportation to your follow-up appointment?: No Do you understand care options if your condition(s) worsen?: Yes-patient verbalized understanding  SDOH Interventions Today    Flowsheet Row Most Recent Value  SDOH Interventions   Food Insecurity Interventions Intervention Not Indicated  Transportation Interventions Intervention Not Indicated  [husband/ caregiver provides all transportation]      TOC Interventions Today    Flowsheet Row Most Recent Value  TOC Interventions   TOC Interventions Discussed/Reviewed TOC Interventions Discussed  [caregiver/ husband declines need for ongoing/ further care coordination outreach,  no care coordination needs identified at time of TOC call  today,  provided my direct contact information should questions/ concerns/ needs arise post-TOC call]      Interventions Today    Flowsheet Row Most Recent Value  Chronic Disease   Chronic disease during today's visit Diabetes, Other  [EDV 03/24/23 for hyperglycemia]  General Interventions   General Interventions Discussed/Reviewed General Interventions Discussed, Doctor Visits, Durable Medical Equipment (DME), Communication with  Doctor Visits Discussed/Reviewed Doctor Visits Discussed, PCP  Durable Medical Equipment (DME) Other  [confirmed not currently requiring/ using assistive devices]  PCP/Specialist Visits Compliance with follow-up visit  Communication with PCP/Specialists  Education Interventions   Education Provided Provided Education  Provided Verbal Education On Blood Sugar Monitoring, Other  [confirmed husband monitoring/ recording blood sugars QD fasting- reviewed with spouse- he reports they continue to run "between 300-340" post-recent ED visit 03/24/23]  Nutrition Interventions   Nutrition Discussed/Reviewed Nutrition Discussed  Pharmacy Interventions   Pharmacy Dicussed/Reviewed Pharmacy Topics Discussed  [Full medication review with updating medication list in EHR per patient report]  Safety Interventions   Safety Discussed/Reviewed Safety Discussed      Caryl Pina, RN, BSN, CCRN Alumnus RN CM Care Coordination/ Transition of Care- [facility] Care Management [phone]: direct office

## 2023-03-28 NOTE — ED Provider Notes (Incomplete)
[place] EMERGENCY DEPARTMENT AT [facility] Provider Note   CSN: [id number] Arrival date & time: 03/28/23  2250     History {Add pertinent medical, surgical, social history, OB history to HPI:1} Chief Complaint  Patient presents with  . Hyperglycemia    Victoria Gates is a 72 y.o. female.  HPI     Home Medications Prior to Admission medications   Medication Sig Start Date End Date Taking? Authorizing Provider  docusate sodium (COLACE) 100 MG capsule Take 200 mg by mouth daily.    [provider]  feeding supplement, ENSURE COMPLETE, (ENSURE COMPLETE) LIQD Take 237 mLs by mouth 3 (three) times daily between meals.    [provider]  glucose blood test strip Use as instructed 10/28/22   Zola Button, Grayling Congress, DO  lansoprazole (PREVACID) 30 MG capsule Take 30 mg by mouth daily with breakfast.     [provider]  mirtazapine (REMERON SOL-TAB) 30 MG disintegrating tablet TAKE 1 TABLET BY MOUTH EVERYDAY AT BEDTIME 02/17/23   Donato Schultz, DO  Multiple Vitamin (MULTIVITAMIN WITH MINERALS) TABS tablet Take 1 tablet by mouth daily. 08/05/22   Noralee Stain, DO  Pitavastatin Calcium 4 MG TABS TAKE 1 TABLET BY MOUTH EVERY DAY 02/17/23   Zola Button, Grayling Congress, DO  rivastigmine (EXELON) 1.5 MG capsule Take 1 capsule (1.5 mg total) by mouth 2 (two) times daily. Patient taking differently: Take 1.5 mg by mouth 2 (two) times daily. 0600 and 1200 05/15/22   Patel, Donika K, DO  sitaGLIPtin (JANUVIA) 50 MG tablet Take 1 tablet (50 mg total) by mouth daily. 09/06/22   Donato Schultz, DO      Allergies    Atorvastatin, Rosuvastatin, and Sulfonamide derivatives    Review of Systems   Review of Systems  Physical Exam Updated Vital Signs BP 115/84 (BP Location: Right Arm)   Pulse (!) 110   Temp 98.5 F (36.9 C) (Oral)   Resp 20   SpO2 97%  Physical Exam  ED Results / Procedures / Treatments   Labs (all labs ordered are listed, but  only abnormal results are displayed) Labs Reviewed  CBG MONITORING, ED - Abnormal; Notable for the following components:      Result Value   Glucose-Capillary 522 (*)    All other components within normal limits  BASIC METABOLIC PANEL  CBC  URINALYSIS, ROUTINE W REFLEX MICROSCOPIC  MAGNESIUM  BETA-HYDROXYBUTYRIC ACID  HEPATIC FUNCTION PANEL  OSMOLALITY  CBG MONITORING, ED    EKG None  Radiology No results found.  Procedures Procedures  {Document cardiac monitor, telemetry assessment procedure when appropriate:1}  Medications Ordered in ED Medications - No data to display  ED Course/ Medical Decision Making/ A&P   {   Click here for ABCD2, HEART and other calculatorsREFRESH Note before signing :1}                          Medical Decision Making Amount and/or Complexity of Data Reviewed Labs: ordered.   ***  {Document critical care time when appropriate:1} {Document review of labs and clinical decision tools ie heart score, Chads2Vasc2 etc:1}  {Document your independent review of radiology images, and any outside records:1} {Document your discussion with family members, caretakers, and with consultants:1} {Document social determinants of health affecting pt's care:1} {Document your decision making why or why not admission, treatments were needed:1} Final Clinical Impression(s) / ED Diagnoses Final diagnoses:  None  Rx / DC Orders ED Discharge Orders     None

## 2023-03-28 NOTE — Transitions of Care (Post Inpatient/ED Visit) (Signed)
   03/28/2023  Name: Victoria Gates MRN: [id number] DOB: July 21, 1951  Today's TOC FU Call Status: Today's TOC FU Call Status:: Unsuccessul Call (1st Attempt) Unsuccessful Call (1st Attempt) Date: 03/28/23  ED EMMI Red Alert notification on 03/28/23 from ED visit 03/24/23- EMMI call placed 03/27/23: "No scheduled follow up"  Attempted to reach the patient regarding the most recent ED visit; left HIPAA compliant voice message requesting call back  Follow Up Plan: Additional outreach attempts will be made to reach the patient to complete the Transitions of Care (Post ED visit) call.   Caryl Pina, RN, BSN, CCRN Alumnus RN CM Care Coordination/ Transition of Care- [facility] Care Management [phone]: direct office

## 2023-03-28 NOTE — ED Provider Notes (Signed)
[facility] EMERGENCY DEPARTMENT AT [facility] Provider Note   CSN: [id number] Arrival date & time: 03/28/23  2250     History  Chief Complaint  Patient presents with   Hyperglycemia    Victoria Gates is a 72 y.o. female presents with her son and her husband of the bedside with concern for hyperglycemia.  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  Previously on insulin, however due to compliance with agitation in context of her dementia, care team opted for oral management of her hyperglycemia at this time.  Per family increased urinary frequency, no nausea vomiting.  Mild increasing confusion from baseline per family.  Patient was seen in the ED on 6/24 with hyperglycemia and treated with 5 units of subcu insulin and IV fluid resuscitation.  Admitted last year per family with DKA.  No anticoagulation. Son Dr. Pollyann Kennedy., radiologist in [place] [phone]).  HPI     Home Medications Prior to Admission medications   Medication Sig Start Date End Date Taking? Authorizing Provider  docusate sodium (COLACE) 100 MG capsule Take 200 mg by mouth daily.    [provider]  feeding supplement, ENSURE COMPLETE, (ENSURE COMPLETE) LIQD Take 237 mLs by mouth 3 (three) times daily between meals.    [provider]  glucose blood test strip Use as instructed 10/28/22   Zola Button, Grayling Congress, DO  lansoprazole (PREVACID) 30 MG capsule Take 30 mg by mouth daily with breakfast.     [provider]  mirtazapine (REMERON SOL-TAB) 30 MG disintegrating tablet TAKE 1 TABLET BY MOUTH EVERYDAY AT BEDTIME 02/17/23   Donato Schultz, DO  Multiple Vitamin (MULTIVITAMIN WITH MINERALS) TABS tablet Take 1 tablet by mouth daily. 08/05/22   Noralee Stain, DO  Pitavastatin Calcium 4 MG TABS TAKE 1 TABLET BY MOUTH EVERY DAY 02/17/23   Zola Button, Grayling Congress, DO  rivastigmine (EXELON) 1.5 MG capsule Take 1 capsule (1.5 mg total) by mouth 2  (two) times daily. Patient taking differently: Take 1.5 mg by mouth 2 (two) times daily. 0600 and 1200 05/15/22   Patel, Donika K, DO  sitaGLIPtin (JANUVIA) 50 MG tablet Take 1 tablet (50 mg total) by mouth daily. 09/06/22   Donato Schultz, DO      Allergies    Atorvastatin, Rosuvastatin, and Sulfonamide derivatives    Review of Systems   Review of Systems  Unable to perform ROS: Dementia    Physical Exam Updated Vital Signs BP 108/74   Pulse 81   Temp 98.5 F (36.9 C) (Oral)   Resp 12   SpO2 100%  Physical Exam Vitals and nursing note reviewed.  Constitutional:      Appearance: She is not ill-appearing or toxic-appearing.  HENT:     Head: Normocephalic and atraumatic.     Mouth/Throat:     Mouth: Mucous membranes are moist.     Pharynx: No oropharyngeal exudate or posterior oropharyngeal erythema.  Eyes:     General:        Right eye: No discharge.        Left eye: No discharge.     Conjunctiva/sclera: Conjunctivae normal.     Pupils: Pupils are equal, round, and reactive to light.  Cardiovascular:     Rate and Rhythm: Normal rate and regular rhythm.     Pulses: Normal pulses.     Comments: Erythema or tenderness palpation though exam limited by patient's dementia. Pulmonary:  Effort: Pulmonary effort is normal. No respiratory distress.     Breath sounds: Normal breath sounds. No wheezing or rales.  Abdominal:     General: Bowel sounds are normal. There is no distension.     Palpations: Abdomen is soft.     Tenderness: There is no abdominal tenderness. There is no right CVA tenderness, left CVA tenderness, guarding or rebound.  Musculoskeletal:        General: No deformity.     Cervical back: Neck supple.     Right lower leg: 1+ Edema present.     Left lower leg: 1+ Edema present.  Skin:    General: Skin is warm and dry.     Capillary Refill: Capillary refill takes less than 2 seconds.  Neurological:     Mental Status: She is alert. Mental status is at  baseline.     Comments: Oriented only to self, at baseline per family .  Psychiatric:        Mood and Affect: Mood normal.     ED Results / Procedures / Treatments   Labs (all labs ordered are listed, but only abnormal results are displayed) Labs Reviewed  BASIC METABOLIC PANEL - Abnormal; Notable for the following components:      Result Value   Glucose, Bld 530 (*)    BUN 30 (*)    Creatinine, Ser 1.20 (*)    GFR, Estimated 48 (*)    All other components within normal limits  URINALYSIS, ROUTINE W REFLEX MICROSCOPIC - Abnormal; Notable for the following components:   Glucose, UA >=500 (*)    Leukocytes,Ua SMALL (*)    All other components within normal limits  BETA-HYDROXYBUTYRIC ACID - Abnormal; Notable for the following components:   Beta-Hydroxybutyric Acid 0.49 (*)    All other components within normal limits  HEPATIC FUNCTION PANEL - Abnormal; Notable for the following components:   Total Protein 8.7 (*)    All other components within normal limits  OSMOLALITY - Abnormal; Notable for the following components:   Osmolality 337 (*)    All other components within normal limits  OSMOLALITY - Abnormal; Notable for the following components:   Osmolality 319 (*)    All other components within normal limits  CBG MONITORING, ED - Abnormal; Notable for the following components:   Glucose-Capillary 522 (*)    All other components within normal limits  CBG MONITORING, ED - Abnormal; Notable for the following components:   Glucose-Capillary 396 (*)    All other components within normal limits  I-STAT VENOUS BLOOD GAS, ED - Abnormal; Notable for the following components:   pO2, Ven 26 (*)    Bicarbonate 29.5 (*)    Acid-Base Excess 4.0 (*)    All other components within normal limits  CBG MONITORING, ED - Abnormal; Notable for the following components:   Glucose-Capillary 350 (*)    All other components within normal limits  CBG MONITORING, ED - Abnormal; Notable for the  following components:   Glucose-Capillary 232 (*)    All other components within normal limits  CBC  MAGNESIUM    EKG None  Radiology No results found.  Procedures .Critical Care  Performed by: Paris Lore, PA-C Authorized by: Paris Lore, PA-C   Critical care provider statement:    Critical care time (minutes):  45   Critical care was time spent personally by me on the following activities:  Development of treatment plan with patient or surrogate, discussions with consultants, evaluation  of patient's response to treatment, examination of patient, obtaining history from patient or surrogate, ordering and performing treatments and interventions, ordering and review of laboratory studies, ordering and review of radiographic studies, pulse oximetry and re-evaluation of patient's condition     Medications Ordered in ED Medications  lactated ringers bolus 1,000 mL (0 mLs Intravenous Stopped 03/29/23 0635)  insulin aspart (novoLOG) injection 10 Units (10 Units Subcutaneous Given 03/29/23 0148)  lactated ringers bolus 1,000 mL (0 mLs Intravenous Stopped 03/29/23 0635)  insulin aspart (novoLOG) injection 5 Units (5 Units Subcutaneous Given 03/29/23 0406)    ED Course/ Medical Decision Making/ A&P                             Medical Decision Making 72 year old female presents with concern for increased urination.  Tachycardic on intake,  vital signs otherwise normal.  Cardiopulmonary exam unremarkable, abdominal exam is benign.  Patient neurologic baseline oriented only to herself.  Well-appearing, playful.  Differential includes but is not limited to hyperglycemia, infectious etiology, other metabolic derangement.   Amount and/or Complexity of Data Reviewed Labs: ordered.    Details:   CBC without cytosis or anemia, BMP with hyperglycemia of 530, creatinine elevated from 1-1.2, electrolytes normal.  Beta hydroxybutyric acid elevated to 0.49.  Magnesium is normal.   Hepatic function panel are normal normal.  Osmolality elevated to 337.  VBG without acidosis.  UA without evidence of infection.   Risk Prescription drug management.   Insulin and fluids administered. Sugar improved to 232, osmolality on the downtrend now 319.  Patient remains hemodynamically stable.  Has follow-up appointment with her PCP on Monday.  Is recently started on increased dose of Januvia from 50 mg daily to 100 mg daily.  Recommend to continue this dose for the next 2 days and follow-up with her PCP as scheduled.  At this time do not feel patient would benefit from admission to the hospital.  Clinical concern for emergent underlying condition that would warrant further ED workup or inpatient management is exceedingly low.  Patient will be discharged home with her husband who appears to be very attentive caregiver.  Charlii's husband  voiced understanding of her medical evaluation and treatment plan. Each of their questions answered to their expressed satisfaction.  Return precautions were given.  Patient is well-appearing, stable, and was discharged in good condition.  This chart was dictated using voice recognition software, Dragon. Despite the best efforts of this provider to proofread and correct errors, errors may still occur which can change documentation meaning.   Final Clinical Impression(s) / ED Diagnoses Final diagnoses:  Hyperglycemia    Rx / DC Orders ED Discharge Orders     None         Paris Lore, PA-C 03/29/23 0703    Gilda Crease, MD 03/31/23 [phone]

## 2023-03-28 NOTE — ED Triage Notes (Signed)
Pot arrived from home via [facility] c/o hyperglycemia. Pt alert to self only at baseline. CBG per EMS 540.

## 2023-03-28 NOTE — Chronic Care Management (AMB) (Signed)
   03/28/2023  Victoria Gates Dec 28, 1950 [id number]   Reason for Encounter: Patient is not currently enrolled in the CCM program. CCM enrollment status changed to "Previously enrolled"   Katha Cabal [facility] Health/Chronic Care Management [phone]

## 2023-03-29 DIAGNOSIS — E1165 Type 2 diabetes mellitus with hyperglycemia: Secondary | ICD-10-CM | POA: Diagnosis not present

## 2023-03-29 LAB — CBC
HCT: 43 % (ref 36.0–46.0)
Hemoglobin: 13.8 g/dL (ref 12.0–15.0)
MCH: 27.2 pg (ref 26.0–34.0)
MCHC: 32.1 g/dL (ref 30.0–36.0)
MCV: 84.8 fL (ref 80.0–100.0)
Platelets: 187 10*3/uL (ref 150–400)
RBC: 5.07 MIL/uL (ref 3.87–5.11)
RDW: 13.4 % (ref 11.5–15.5)
WBC: 7.8 10*3/uL (ref 4.0–10.5)
nRBC: 0 % (ref 0.0–0.2)

## 2023-03-29 LAB — HEPATIC FUNCTION PANEL
ALT: 40 U/L (ref 0–44)
AST: 32 U/L (ref 15–41)
Albumin: 3.9 g/dL (ref 3.5–5.0)
Alkaline Phosphatase: 83 U/L (ref 38–126)
Bilirubin, Direct: 0.1 mg/dL (ref 0.0–0.2)
Indirect Bilirubin: 0.4 mg/dL (ref 0.3–0.9)
Total Bilirubin: 0.5 mg/dL (ref 0.3–1.2)
Total Protein: 8.7 g/dL — ABNORMAL HIGH (ref 6.5–8.1)

## 2023-03-29 LAB — MAGNESIUM: Magnesium: 2.3 mg/dL (ref 1.7–2.4)

## 2023-03-29 LAB — I-STAT VENOUS BLOOD GAS, ED
Acid-Base Excess: 4 mmol/L — ABNORMAL HIGH (ref 0.0–2.0)
Bicarbonate: 29.5 mmol/L — ABNORMAL HIGH (ref 20.0–28.0)
Calcium, Ion: 1.23 mmol/L (ref 1.15–1.40)
HCT: 42 % (ref 36.0–46.0)
Hemoglobin: 14.3 g/dL (ref 12.0–15.0)
O2 Saturation: 46 %
Potassium: 4.4 mmol/L (ref 3.5–5.1)
Sodium: 143 mmol/L (ref 135–145)
TCO2: 31 mmol/L (ref 22–32)
pCO2, Ven: 48.1 mmHg (ref 44–60)
pH, Ven: 7.396 (ref 7.25–7.43)
pO2, Ven: 26 mmHg — CL (ref 32–45)

## 2023-03-29 LAB — BASIC METABOLIC PANEL
Anion gap: 11 (ref 5–15)
BUN: 30 mg/dL — ABNORMAL HIGH (ref 8–23)
CO2: 25 mmol/L (ref 22–32)
Calcium: 10 mg/dL (ref 8.9–10.3)
Chloride: 102 mmol/L (ref 98–111)
Creatinine, Ser: 1.2 mg/dL — ABNORMAL HIGH (ref 0.44–1.00)
GFR, Estimated: 48 mL/min — ABNORMAL LOW (ref >60)
Glucose, Bld: 530 mg/dL (ref 70–99)
Potassium: 3.9 mmol/L (ref 3.5–5.1)
Sodium: 138 mmol/L (ref 135–145)

## 2023-03-29 LAB — CBG MONITORING, ED
Glucose-Capillary: 396 mg/dL — ABNORMAL HIGH (ref 70–99)
Glucose-Capillary: 350 mg/dL — ABNORMAL HIGH (ref 70–99)
Glucose-Capillary: 232 mg/dL — ABNORMAL HIGH (ref 70–99)

## 2023-03-29 LAB — OSMOLALITY
Osmolality: 337 mOsm/kg (ref 275–295)
Osmolality: 319 mOsm/kg — ABNORMAL HIGH (ref 275–295)

## 2023-03-29 LAB — BETA-HYDROXYBUTYRIC ACID: Beta-Hydroxybutyric Acid: 0.49 mmol/L — ABNORMAL HIGH (ref 0.05–0.27)

## 2023-03-29 MED ORDER — LACTATED RINGERS IV BOLUS
1000.0000 mL | Freq: Once | INTRAVENOUS | Status: AC
  Administered 2023-03-29: 1000 mL via INTRAVENOUS

## 2023-03-29 MED ORDER — INSULIN ASPART 100 UNIT/ML IJ SOLN
5.0000 [IU] | Freq: Once | INTRAMUSCULAR | Status: AC
  Administered 2023-03-29: 5 [IU] via SUBCUTANEOUS

## 2023-03-29 MED ORDER — INSULIN ASPART 100 UNIT/ML IJ SOLN
10.0000 [IU] | Freq: Once | INTRAMUSCULAR | Status: AC
  Administered 2023-03-29: 10 [IU] via SUBCUTANEOUS

## 2023-03-29 NOTE — ED Notes (Signed)
Date and time results received: 03/29/23 0152 (use smartphrase ".now" to insert current time)  Test: serum osmo Critical Value: 337  Name of Provider Notified: Jaci Carrel  Orders Received? Or Actions Taken?:  MD updated, waiting for new orders.

## 2023-03-29 NOTE — Discharge Instructions (Signed)
Victoria Gates's blood sugar was significantly elevated at the time of arrival to the emergency department.  It was treated with insulin and IV fluids.  Please continue her medications as prescribed and follow-up with her primary care doctor.  Return to the ER with any new severe symptoms. Please follow up with your PCP on Monday as scheduled. Administer 2 tablets of her sitagliptin for a total of 100 mg daily today and tomorrow and discuss her medication management with her PCP on Monday.

## 2023-03-31 ENCOUNTER — Ambulatory Visit: Payer: Medicare HMO | Admitting: Family Medicine

## 2023-03-31 ENCOUNTER — Telehealth: Payer: Self-pay

## 2023-03-31 ENCOUNTER — Other Ambulatory Visit: Payer: Self-pay | Admitting: Family Medicine

## 2023-03-31 ENCOUNTER — Ambulatory Visit: Payer: Self-pay | Admitting: Licensed Clinical Social Worker

## 2023-03-31 ENCOUNTER — Encounter: Payer: Self-pay | Admitting: Family Medicine

## 2023-03-31 ENCOUNTER — Ambulatory Visit (INDEPENDENT_AMBULATORY_CARE_PROVIDER_SITE_OTHER): Payer: Medicare HMO | Admitting: Family Medicine

## 2023-03-31 VITALS — BP 120/70 | HR 111 | Resp 18 | Ht 68.0 in | Wt 183.0 lb

## 2023-03-31 DIAGNOSIS — Z794 Long term (current) use of insulin: Secondary | ICD-10-CM | POA: Diagnosis not present

## 2023-03-31 DIAGNOSIS — E1165 Type 2 diabetes mellitus with hyperglycemia: Secondary | ICD-10-CM

## 2023-03-31 LAB — GLUCOSE, POCT (MANUAL RESULT ENTRY): POC Glucose: 587 mg/dl — AB (ref 70–99)

## 2023-03-31 MED ORDER — NOVOLOG FLEXPEN 100 UNIT/ML ~~LOC~~ SOPN
PEN_INJECTOR | SUBCUTANEOUS | 11 refills | Status: DC
Start: 2023-03-31 — End: 2023-03-31

## 2023-03-31 MED ORDER — INSULIN LISPRO (1 UNIT DIAL) 100 UNIT/ML (KWIKPEN): PEN_INJECTOR | SUBCUTANEOUS | 3 refills | Status: DC

## 2023-03-31 MED ORDER — NOVOFINE PEN NEEDLE 32G X 6 MM MISC
1 refills | Status: DC
Start: 2023-03-31 — End: 2023-04-24

## 2023-03-31 NOTE — Telephone Encounter (Signed)
Transition Care Management Unsuccessful Follow-up Telephone Call  Date of discharge and from where:  [facility]  Attempts:  1st Attempt  Reason for unsuccessful TCM follow-up call:  No answer/busy   Lenard Forth [facility] Guide, [facility] [phone] [address] [place], [place], [place] [address] Phone: [phone] Email: [email or web address]

## 2023-03-31 NOTE — Patient Instructions (Signed)
Call in novolog regular insulin pen----- and have her husband use sliding scale ----- check glucose qid and if 200-250   2 u ,   251-300 4 u ====  301-350 6 u-----  351-400 8 u and >400 10 u and call dr on call

## 2023-03-31 NOTE — Progress Notes (Signed)
Established Patient Office Visit  Subjective   Patient ID: Victoria Gates, female    DOB: [birth year]  Age: 72 y.o. MRN: [id number]  Chief Complaint  Patient presents with   Hospitalization Follow-up    HPI Discussed the use of AI scribe software for clinical note transcription with the patient, who gave verbal consent to proceed.  History of Present Illness   The patient, with a history of diabetes and cancer, is brought in by their caregiver due to uncontrolled blood sugar levels. The caregiver reports that the patient's blood sugar levels have been increasingly high, with a recent reading of 587. The patient's blood sugar levels have been erratic, with readings ranging from 232 to 587. The patient's diabetes has been previously managed with oral medication, specifically Januvia, but the caregiver reports that the patient's blood sugar levels have been out of control recently despite consistent medication use. The patient's caregiver also mentions that the patient has not been sleeping well, which may be contributing to her current state.      Patient Active Problem List   Diagnosis Date Noted   Uncontrolled type 2 diabetes mellitus with hyperglycemia (HCC) 02/03/2023   Pressure injury of buttock, stage 2 (HCC) 08/10/2022   Pressure injury of skin 08/08/2022   Chronic liver failure (HCC) 08/07/2022   Anemia 08/07/2022   Malnutrition of moderate degree 08/03/2022   AKI (acute kidney injury) (HCC) 08/03/2022   Acute metabolic encephalopathy 08/03/2022   Transaminitis 08/03/2022   Hypernatremia 08/02/2022   Altered mental status 08/02/2022   Lactic acidosis 08/02/2022   DKA (diabetic ketoacidosis) (HCC) 07/31/2022   Mild protein-calorie malnutrition (HCC) 01/14/2022   Preventative health care 07/12/2021   Hyperlipidemia associated with type 2 diabetes mellitus (HCC) 07/12/2021   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HCC) 04/03/2021   Pre-op  examination 04/03/2021   Hyperlipidemia 07/03/2020   Mild neurocognitive disorder, 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   Bradycardia 04/29/2016   Hypokalemia 11/30/2015   Neutropenia (HCC) 05/03/2015   Syncope 09/11/2014   Bronchitis 09/08/2014   Multiple myeloma (HCC) 10/22/2013   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   Hyperproteinemia 04/14/2013   Midsternal chest pain 04/20/2012   Lipoma of shoulder 11/02/2011   Herpes zoster 11/05/2010   GERD (gastroesophageal reflux disease) 09/21/2010   Cervical Radiculopathy, Left 02/01/2010   Hypercholesterolemia 07/03/2009   Otitis Externa 07/03/2009   Cerumen impaction 07/03/2009   Myalgia 07/03/2009   Allergic rhinitis 01/02/2009   Diabetes mellitus type II, non insulin dependent (HCC) 03/18/2008   Chest pain 12/24/2007   URI 12/12/2007   Anemia-Iron Deficiency 04/24/2007   Essential hypertension 04/24/2007   Osteoporosis 04/24/2007   Esophageal Stricture 04/15/2007   Hiatal Hernia 04/15/2007   Past Medical History:  Diagnosis Date   Allergic rhinitis 01/02/2009   Bradycardia 04/29/2016   Cervical Radiculopathy 02/01/2010   Left side   Chest pain 04/15/2012   Ex MV: Ex time 10 mins, EF 63%, no ischemia/infarct. Occas PAC's/PVC's.   Dementia (HCC)    Diabetes mellitus, type II 03/18/2008   Encounter for antineoplastic chemotherapy 04/29/2016   Esophageal Stricture 04/15/2007   s/p dilitation   GERD (gastroesophageal reflux disease) 09/21/2010   Herpes zoster 11/05/2010   Hiatal Hernia 04/15/2007   History of colonic polyps 04/24/2007   Hyperplastic only   Hypercholesterolemia 07/03/2009   Hypokalemia 11/30/2015   Iron deficiency anemia 04/24/2007   Multiple myeloma (HCC) 02/2014   Osteoporosis 04/24/2007   Past Surgical History:  Procedure Laterality Date  ESOPHAGOGASTRODUODENOSCOPY  04/15/2007   TUBAL LIGATION     Social History   Tobacco Use   Smoking status: Never   Smokeless tobacco: Never  Vaping Use   Vaping Use: Never  used  Substance Use Topics   Alcohol use: No   Drug use: No   Social History   Socioeconomic History   Marital status: Married    Spouse name: Not on file   Number of children: Not on file   Years of education: Not on file   Highest education level: Associate degree: academic program  Occupational History   Occupation: Scientist, research (physical sciences): [address]  Tobacco Use   Smoking status: Never   Smokeless tobacco: Never  Vaping Use   Vaping Use: Never used  Substance and Sexual Activity   Alcohol use: No   Drug use: No   Sexual activity: Not Currently  Other Topics Concern   Not on file  Social History Narrative   Lives in [place] with spouse.   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   Right Handed   Social Determinants of Health   Financial Resource Strain: Low Risk  (02/03/2023)   Overall Financial Resource Strain (CARDIA)    Difficulty of Paying Living Expenses: Not hard at all  Food Insecurity: No Food Insecurity (03/28/2023)   Hunger Vital Sign    Worried About Running Out of Food in the Last Year: Never true    Ran Out of Food in the Last Year: Never true  Transportation Needs: No Transportation Needs (03/28/2023)   PRAPARE - Administrator, Civil Service (Medical): No    Lack of Transportation (Non-Medical): No  Physical Activity: Insufficiently Active (02/03/2023)   Exercise Vital Sign    Days of Exercise per Week: 1 day    Minutes of Exercise per Session: 30 min  Stress: No Stress Concern Present (02/03/2023)   [employer] of Occupational Health - Occupational Stress Questionnaire    Feeling of Stress : Not at all  Social Connections: Moderately Isolated (02/03/2023)   Social Connection and Isolation Panel [NHANES]    Frequency of Communication with Friends and Family: Once a week    Frequency of Social Gatherings with Friends and Family: Once a week    Attends Religious Services: 1 to 4 times per year    Active Member of [employer]  or Organizations: No    Attends Engineer, structural: Not on file    Marital Status: Married  Catering manager Violence: Not on file   Family Status  Relation Name Status   Mother  Deceased       mother had "brain tumor"   Father  Deceased   Sister  Alive       major depression   Sister  Alive   Sister  Deceased at age 36       als   Sister  Deceased at age 73       depression protein cal malnutrition heart attack   Brother  (Not Specified)   Neg Hx  (Not Specified)   Family History  Problem Relation Age of Onset   Brain cancer Mother    Heart disease Mother    Lung cancer Father    Heart disease Father    Diabetes Sister    Heart disease Sister    Heart attack Sister    ALS Sister    Diabetes Brother    Heart disease Brother  Cancer Neg Hx        No FH of Colon Cancer   Colon cancer Neg Hx    Esophageal cancer Neg Hx    Rectal cancer Neg Hx    Stomach cancer Neg Hx    Allergies  Allergen Reactions   Atorvastatin Other (See Comments)    myalgias   Rosuvastatin Other (See Comments)    myalgias   Sulfonamide Derivatives Other (See Comments)    Unknown reaction       Review of Systems  Constitutional:  Negative for fever and malaise/fatigue.  HENT:  Negative for congestion.   Eyes:  Negative for blurred vision.  Respiratory:  Negative for cough and shortness of breath.   Cardiovascular:  Negative for chest pain, palpitations and leg swelling.  Gastrointestinal:  Negative for vomiting.  Musculoskeletal:  Negative for back pain.  Skin:  Negative for rash.  Neurological:  Negative for loss of consciousness and headaches.  Psychiatric/Behavioral:  Positive for memory loss. The patient is nervous/anxious.       Objective:     BP 120/70 (BP Location: Right Arm, Patient Position: Sitting, Cuff Size: Normal)   Pulse (!) 111   Resp 18   Ht 5\' 8"  (1.727 m)   Wt 183 lb (83 kg)   SpO2 97%   BMI 27.83 kg/m  BP Readings from Last 3 Encounters:   03/31/23 120/70  03/29/23 108/74  03/24/23 132/87   Wt Readings from Last 3 Encounters:  03/31/23 183 lb (83 kg)  03/24/23 194 lb 0.1 oz (88 kg)  03/19/23 192 lb (87.1 kg)   SpO2 Readings from Last 3 Encounters:  03/31/23 97%  03/29/23 100%  03/24/23 99%      Physical Exam Vitals and nursing note reviewed.  Constitutional:      General: She is not in acute distress.    Appearance: She is well-developed.  HENT:     Head: Normocephalic and atraumatic.  Eyes:     General: No scleral icterus.       Right eye: No discharge.        Left eye: No discharge.  Cardiovascular:     Rate and Rhythm: Normal rate and regular rhythm.     Heart sounds: No murmur heard. Pulmonary:     Effort: Pulmonary effort is normal. No respiratory distress.     Breath sounds: Normal breath sounds.  Musculoskeletal:        General: Normal range of motion.     Cervical back: Normal range of motion and neck supple.     Right lower leg: No edema.     Left lower leg: No edema.  Skin:    General: Skin is warm and dry.  Neurological:     Mental Status: Mental status is at baseline. She is disoriented.  Psychiatric:        Mood and Affect: Mood normal.        Behavior: Behavior normal.        Thought Content: Thought content normal.        Judgment: Judgment normal.      Results for orders placed or performed in visit on 03/31/23  POCT glucose (manual entry)  Result Value Ref Range   POC Glucose 587 (A) 70 - 99 mg/dl    Last CBC Lab Results  Component Value Date   WBC 7.8 03/28/2023   HGB 14.3 03/29/2023   HCT 42.0 03/29/2023   MCV 84.8 03/28/2023   MCH 27.2 03/28/2023  RDW 13.4 03/28/2023   PLT 187 03/28/2023   Last metabolic panel Lab Results  Component Value Date   GLUCOSE 530 (HH) 03/28/2023   NA 143 03/29/2023   K 4.4 03/29/2023   CL 102 03/28/2023   CO2 25 03/28/2023   BUN 30 (H) 03/28/2023   CREATININE 1.20 (H) 03/28/2023   GFRNONAA 48 (L) 03/28/2023   CALCIUM 10.0  03/28/2023   PHOS 3.1 08/03/2022   PROT 8.7 (H) 03/28/2023   ALBUMIN 3.9 03/28/2023   BILITOT 0.5 03/28/2023   ALKPHOS 83 03/28/2023   AST 32 03/28/2023   ALT 40 03/28/2023   ANIONGAP 11 03/28/2023   Last lipids Lab Results  Component Value Date   CHOL 171 02/03/2023   HDL 36.10 (L) 02/03/2023   LDLCALC 103 (H) 08/12/2022   LDLDIRECT 106.0 02/03/2023   TRIG 287.0 (H) 02/03/2023   CHOLHDL 5 02/03/2023   Last hemoglobin A1c Lab Results  Component Value Date   HGBA1C 6.8 (H) 02/03/2023   Last thyroid functions Lab Results  Component Value Date   TSH 4.49 12/08/2018   Last vitamin D Lab Results  Component Value Date   VD25OH 37.30 09/01/2017   Last vitamin B12 and Folate Lab Results  Component Value Date   VITAMINB12 1,336 (H) 08/07/2022   FOLATE 19.4 08/07/2022      The 10-year ASCVD risk score (Arnett DK, et al., 2019) is: 21.7%    Assessment & Plan:   Problem List Items Addressed This Visit       Unprioritized   Uncontrolled type 2 diabetes mellitus with hyperglycemia (HCC) - Primary   Relevant Medications   Insulin Pen Needle (NOVOFINE PEN NEEDLE) 32G X 6 MM MISC   Other Relevant Orders   Ambulatory referral to Endocrinology   POCT glucose (manual entry) (Completed)  Assessment and Plan    Uncontrolled Diabetes Mellitus: Blood glucose levels are elevated despite current oral medication regimen. Discussed the need for insulin therapy due to persistent hyperglycemia. -Start Evaristo Bury 10 units daily. -Start sliding scale insulin regimen with regular insulin. -Continue current oral medications (Januvia). -Check blood glucose levels four times daily. -Schedule follow-up with endocrinologist for further management.  Sleep Disturbance: Patient reported staying up all night watching TV, which may be contributing to her overall health status. -Encourage good sleep hygiene practices.  General Health Maintenance: -Continue monitoring blood glucose levels and  adjust insulin dosage as needed based on sliding scale. -If blood glucose levels exceed 400, administer 10 units of insulin and contact the doctor.        Return in about 3 months (around 07/01/2023), or if symptoms worsen or fail to improve.    Donato Schultz, DO

## 2023-04-01 ENCOUNTER — Telehealth: Payer: Self-pay

## 2023-04-01 ENCOUNTER — Telehealth: Payer: Self-pay | Admitting: Family Medicine

## 2023-04-01 NOTE — Telephone Encounter (Signed)
Transition Care Management Follow-up Telephone Call Date of discharge and from where: [facility] How have you been since you were released from the hospital?  Any questions or concerns? No  Items Reviewed: Did the pt receive and understand the discharge instructions provided? Yes  Medications obtained and verified? Yes  Other? No  Any new allergies since your discharge? No  Dietary orders reviewed? No Do you have support at home? Yes     Follow up appointments reviewed:  PCP Hospital f/u appt confirmed? Yes  Scheduled to see  on  @ . Specialist Hospital f/u appt confirmed? No  Scheduled to see  on @ . Are transportation arrangements needed? No  If their condition worsens, is the pt aware to call PCP or go to the Emergency Dept.? Yes Was the patient provided with contact information for the PCP's office or ED? Yes Was to pt encouraged to call back with questions or concerns? Yes

## 2023-04-01 NOTE — Telephone Encounter (Signed)
Pt's husband said Dr. Laury Axon told them to call if pt's blood sugar goes over a certain number. He said her blood sugar at 418 but he has given her 10 units. Please call pt to advise

## 2023-04-01 NOTE — Patient Instructions (Signed)
Social Work Visit Information  Thank you for taking time to visit with me today. Please don't hesitate to contact me if I can be of assistance to you.   Following are the goals we discussed today:   Goals Addressed             This Visit's Progress    Manage Caregiver stress       Activities and task to complete in order to accomplish goals.  Husband will assist I have scheduled a phone appointment with the RN Care Manager she will provide educational informational and assist you with managing your health needs related to diabetes and using sliding scale. You and I will work together on Caregiver stress         Our next appointment is by telephone on 04/02/23 at 2:45   Please call the care guide team at [phone] if you need to cancel or reschedule your appointment.   If you or anyone you know are experiencing a Mental Health or Behavioral Health Crisis or need someone to talk to, please call the Suicide and Crisis Lifeline: 988 call the [place] National Suicide Prevention Lifeline: (213) 205-3363 or TTY: (978)509-4150 TTY 952-682-1288) to talk to a trained counselor call 1-800-273-TALK (toll free, 24 hour hotline) go to [facility] [address] [place] [phone])   Patient verbalizes understanding of instructions and care plan provided today and agrees to view in MyChart. Active MyChart status and patient understanding of how to access instructions and care plan via MyChart confirmed with patient.       Sammuel Hines, LCSW Social Work Care Coordination  [facility] [employer] [employer] [phone]

## 2023-04-01 NOTE — Patient Outreach (Signed)
  Care Coordination  In Person Provider Office Visit Note   04/01/2023 Name: Victoria Gates MRN: [id number] DOB: Mar 04, 1951  Victoria Gates is a 72 y.o. year old female who sees Zola Button, Grayling Congress, DO for primary care. I spoke with  [name] husband today.  What matters to the patients health and wellness today?  Managing caregiver stress  Patient was accompanied by husband who provided information during this encounter..  See goals below.   Goals Addressed             This Visit's Progress    Manage Caregiver stress       Activities and task to complete in order to accomplish goals.  Husband will assist I have scheduled a phone appointment with the RN Care Manager she will provide educational informational and assist you with managing your health needs related to diabetes and using sliding scale. You and I will work together on Psychologist, sport and exercise        SDOH assessments and interventions completed:  No   Care Coordination Interventions:  Yes, provided  Interventions Today    Flowsheet Row Most Recent Value  Chronic Disease   Chronic disease during today's visit Hypertension (HTN), Diabetes  General Interventions   General Interventions Discussed/Reviewed General Interventions Discussed, Referral to Nurse, Communication with  [reviewed care coordination program]  Communication with PCP/Specialists, RN  Education Interventions   Education Provided Provided Education  Mental Health Interventions   Mental Health Discussed/Reviewed Mental Health Discussed, Other  [caregiver stress for husband]       Follow up plan:  Referral made to RN for assistance with diabetes education and management Follow up call scheduled for 04/02/23 with Social Work    Encounter Outcome:  Pt. Visit Completed   Sammuel Hines, LCSW Social Work Care Coordination  [employer] [facility] [facility] [employer] [phone]

## 2023-04-02 ENCOUNTER — Ambulatory Visit: Payer: Self-pay | Admitting: Licensed Clinical Social Worker

## 2023-04-02 NOTE — Patient Instructions (Signed)
Social Work Visit Information  Thank you for taking time to visit with me today. Please don't hesitate to contact me if I can be of assistance to you.   Following are the goals we discussed today:   Goals Addressed             This Visit's Progress    Manage Caregiver stress       Activities and task to complete in order to accomplish goals.  Husband will assist I have scheduled a phone appointment with the RN Care Manager she will provide educational informational and assist you with managing your health needs related to diabetes and using sliding scale. Keep all upcoming appointment discussed today Continue with compliance of taking medication prescribed by Doctor Self Support options  (you will talk with your children about assisting you so that you can have self-care time) Bring copy of Advance Directive to be scanned into chart          Our next appointment is by telephone on 05/01/23 at 8:30   Please call the care guide team at [phone] if you need to cancel or reschedule your appointment.   If you or anyone you know are experiencing a Mental Health or Behavioral Health Crisis or need someone to talk to, please call the Suicide and Crisis Lifeline: 988 call the [place] National Suicide Prevention Lifeline: (760)874-2996 or TTY: 843-042-5045 TTY 6153910022) to talk to a trained counselor call 1-800-273-TALK (toll free, 24 hour hotline) go to [facility] [address], [place] [phone])   Patient 's husband verbalizes understanding of instructions and care plan provided today and agrees to view in MyChart. Active MyChart status and patient understanding of how to access instructions and care plan via MyChart confirmed with patient.       Sammuel Hines, LCSW Social Work Care Coordination  [facility] [employer] [employer] [phone]

## 2023-04-02 NOTE — Patient Outreach (Signed)
  Care Coordination  Follow Up Visit Note   04/02/2023 Name: Victoria Gates MRN: [id number] DOB: [birth year]  Victoria Gates is a 72 y.o. year old female who sees Victoria Gates, Victoria Congress, DO for primary care. I spoke with  Victoria Gates 's husband by phone today.  What matters to the patients health and wellness today?    Provided caregiver support during this encounter.   Goals Addressed             This Visit's Progress    Manage Caregiver stress       Activities and task to complete in order to accomplish goals.  Husband will assist I have scheduled a phone appointment with the RN Care Manager she will provide educational informational and assist you with managing your health needs related to diabetes and using sliding scale. Keep all upcoming appointment discussed today Continue with compliance of taking medication prescribed by Doctor Self Support options  (you will talk with your children about assisting you so that you can have self-care time) Bring copy of Advance Directive to be scanned into chart         SDOH assessments and interventions completed:  No   Care Coordination Interventions:  Yes, provided  Interventions Today    Flowsheet Row Most Recent Value  Chronic Disease   Chronic disease during today's visit Hypertension (HTN), Diabetes  Education Interventions   Education Provided Provided Education  Provided Verbal Education On Mental Health/Coping with Illness  [active listening, solution focused, task centered, motivational interviewing]  Mental Health Interventions   Mental Health Discussed/Reviewed Mental Health Reviewed, Coping Strategies  [caregiver stress]  Advanced Directive Interventions   Advanced Directives Discussed/Reviewed Advanced Directives Discussed  [per husband patient has documents]       Follow up plan: Follow up call scheduled for 30 days    Encounter Outcome:  Pt. Visit Completed   Sammuel Hines, LCSW Social Work Care Coordination   [facility] [employer] [employer] [phone]

## 2023-04-02 NOTE — Telephone Encounter (Signed)
Spoke with patient's husband. He verbalized understanding. He reports glucoses yesterday.   Yesterday morning--303  8:15--318  1:00pm--418  3:59--331  7pm--202  This morning was 286

## 2023-04-04 ENCOUNTER — Telehealth: Payer: Self-pay

## 2023-04-04 NOTE — Progress Notes (Signed)
   Care Guide Note  04/04/2023 Name: Victoria Gates MRN: [id number] DOB: 1950/11/30  Referred by: Donato Schultz, DO Reason for referral : Care Coordination (Outreach to schedule with Pharm d )   Victoria Gates is a 72 y.o. year old female who is a primary care patient of Donato Schultz, DO. [name] was referred to the pharmacist for assistance related to DM.    Successful contact was made with the patient to discuss pharmacy services including being ready for the pharmacist to call at least 5 minutes before the scheduled appointment time, to have medication bottles and any blood sugar or blood pressure readings ready for review. The patient agreed to meet with the pharmacist via with the pharmacist via telephone visit on (date/time).  04/07/2023  [name], RMA Care Guide [facility]  [place], [place] [address] Direct Dial: [phone] [name]

## 2023-04-07 ENCOUNTER — Encounter: Payer: Self-pay | Admitting: Pharmacist

## 2023-04-07 ENCOUNTER — Ambulatory Visit (INDEPENDENT_AMBULATORY_CARE_PROVIDER_SITE_OTHER): Payer: Medicare HMO | Admitting: Pharmacist

## 2023-04-07 DIAGNOSIS — Z794 Long term (current) use of insulin: Secondary | ICD-10-CM

## 2023-04-07 DIAGNOSIS — E1165 Type 2 diabetes mellitus with hyperglycemia: Secondary | ICD-10-CM

## 2023-04-07 DIAGNOSIS — N1831 Chronic kidney disease, stage 3a: Secondary | ICD-10-CM

## 2023-04-07 DIAGNOSIS — E785 Hyperlipidemia, unspecified: Secondary | ICD-10-CM

## 2023-04-07 DIAGNOSIS — G3109 Other frontotemporal dementia: Secondary | ICD-10-CM

## 2023-04-07 DIAGNOSIS — E1122 Type 2 diabetes mellitus with diabetic chronic kidney disease: Secondary | ICD-10-CM

## 2023-04-07 DIAGNOSIS — E1169 Type 2 diabetes mellitus with other specified complication: Secondary | ICD-10-CM

## 2023-04-07 DIAGNOSIS — F0280 Dementia in other diseases classified elsewhere without behavioral disturbance: Secondary | ICD-10-CM

## 2023-04-07 MED ORDER — TRESIBA FLEXTOUCH 100 UNIT/ML ~~LOC~~ SOPN: 10.0000 [IU] | PEN_INJECTOR | Freq: Every day | SUBCUTANEOUS | 1 refills | Status: DC

## 2023-04-07 MED ORDER — SITAGLIPTIN PHOSPHATE 100 MG PO TABS
100.0000 mg | ORAL_TABLET | Freq: Every day | ORAL | 1 refills | Status: DC
Start: 2023-04-07 — End: 2023-05-28

## 2023-04-07 MED ORDER — GLUCOSE BLOOD VI STRP
1.0000 | ORAL_STRIP | Freq: Four times a day (QID) | 2 refills | Status: DC
Start: 2023-04-07 — End: 2023-05-28

## 2023-04-07 NOTE — Patient Instructions (Signed)
Mr. And Mrs. [place], It was a pleasure speaking with you today.  Below is a summary of our recent visit.   Diabetes: Currently uncontrolled Start Tresiba 10 units once a daily - [facility] [address].  I also sent an updated prescription for Januvia 100mg  tablets - you will take just 1 tablet daily  Continue to use Novolog per sliding scale if needed for blood glucose > 200.   How to Treat a Low Glucose Level:  If you have a low blood glucose less than 70, please eat / drink 15 grams of carbohydrates (4 oz of juice, soda, 4 glucose tablets, or 3-4 pieces of hard candy).  It is best so choose a "quick" source of sugar that does no contain fat (chocolate and peanut butter might take longer to increase your blood glucose) Wait 15 minutes and then recheck your blood glucose. If your blood glucose is still less than 70, eat another 15 grams of carbohydrates.  Wait another 15 minutes and recheck your glucose.  Continue this until your blood glucose is over 70. Once you blood glucose is over 70, eat a snack with protein in it to prevent your blood glucose from dropping again.  - Continue to check blood glucose 4 times a day Fasting blood glucose goal (before meals) = 80 to 130 Blood glucose goal after a meal = less than 180   - Change to Glucerna or lower carbohydrate Ensure; Change applesauce to no sugar added option ([employer] has this option)     As always if you have any questions or concerns especially regarding medications, please feel free to contact me either at the phone number below or with a MyChart message.   Keep up the good work!  Henrene Pastor, PharmD Clinical Pharmacist [facility] [phone] (direct line)  [phone] (main office number)

## 2023-04-07 NOTE — Progress Notes (Signed)
04/07/2023 Name: Victoria Gates MRN: [id number] DOB: Jun 22, 1951  Chief Complaint  Patient presents with   Diabetes    Initial visit    Victoria Gates is a 72 y.o. year old female who presented for a telephone visit.   They were referred to the pharmacist by their PCP for assistance in managing diabetes.  Spoke with patient's husband today. Patient has dementia and he administers medications.    Subjective:  Care Team: Primary Care Provider: Zola Button, Grayling Congress, DO ; Next Scheduled Visit: not currently scheduled Neurologist: Dr Allena Katz; Next Scheduled Visit: 05/12/2023 Hematology / Oncology - Dr Ivonne Andrew; Next Scheduled Visit: 09/23/2023 Patient has been referred back to endocrinology but referral has not been completed yet.   Medication Access/Adherence  Current Pharmacy:  [id number] [place], [place] - [address] [place] [address] Phone: [phone] Fax: [phone]  [facility] [address] [place] [address] Phone: [phone] Fax: [phone]  [id number] [place], [place] - [address] [place] [address] Phone: [phone] Fax: [phone]   Patient reports affordability concerns with their medications: No  Patient reports access/transportation concerns to their pharmacy: No  Patient reports adherence concerns with their medications:  No   medications administered by her husband due to dementia.   Diabetes:  Current medications: Januvia 50mg - take 2 tablets = 100mg  daily Novolog sliding scale (blood glucose 200 to 250 - give 2 units; 251 to 300 - give 4 units; 301 to 350 - give 6 units 351 to 400 give 8 units, if blood glucose > 400 take 10 units and call physician)  Medications tried in the past: metformin - tried 07/2022 - stopped due to nausea and vomiting.  Current glucose readings: variable her husband. Lowest  was 154 on 04/04/2023 at 10pm after correction of Novolog at dinner.  This morning was 243 - gave 2 units of Novolog. Usually 230 to 270 in morning and 300's in afternoon.   testing four times daily   Patient denies hypoglycemic s/sx including no dizziness, shakiness, sweating. Patient denies hyperglycemic symptoms including no polyuria, polydipsia, polyphagia, nocturia, neuropathy, blurred vision.  Current meal patterns:  Husband reports he feels like blood glucose increases after applesauce (Mott's regular single serve) in morning. She is also drinking 1 Ensure each morning.    Hyperlipidemia/ASCVD Risk Reduction  Current lipid lowering medications: pitavastatin 4mg  daily Medications tried in the past: atorvastatin and rosuvastatin - both caused myalgias  Antiplatelet regimen: none   Objective:  Lab Results  Component Value Date   HGBA1C 6.8 (H) 02/03/2023    Lab Results  Component Value Date   CREATININE 1.20 (H) 03/28/2023   BUN 30 (H) 03/28/2023   NA 143 03/29/2023   K 4.4 03/29/2023   CL 102 03/28/2023   CO2 25 03/28/2023    Lab Results  Component Value Date   CHOL 171 02/03/2023   HDL 36.10 (L) 02/03/2023   LDLCALC 103 (H) 08/12/2022   LDLDIRECT 106.0 02/03/2023   TRIG 287.0 (H) 02/03/2023   CHOLHDL 5 02/03/2023    Medications Reviewed Today     Reviewed by Henrene Pastor, RPH-CPP (Pharmacist) on 04/07/23 at [phone]  Med List Status: <None>   Medication Order Taking? Sig Documenting Provider Last Dose Status Informant  docusate sodium (COLACE) 100 MG capsule [id number] Yes Take 200 mg by mouth daily. [provider] Taking Active Spouse/Significant Other  feeding supplement, ENSURE COMPLETE, (ENSURE COMPLETE) LIQD [id number] Yes Take 237 mLs by mouth 3 (three) times daily between meals. [provider] Taking Active Spouse/Significant Other  glucose blood test strip [id number] Yes Use as instructed  Patient taking differently: 1 each 4 (four)  times daily. Use as instructed   Zola Button, Grayling Congress, DO Taking Active   insulin lispro (HUMALOG KWIKPEN) 100 UNIT/ML KwikPen [id number] Yes PER SLIDING SCALE----- NO MORE THAN 40 U / DAY Lowne Chase, [place], DO Taking Active   Insulin Pen Needle (NOVOFINE PEN NEEDLE) 32G X 6 MM MISC [id number] Yes As directed Donato Schultz, DO Taking Active   lansoprazole (PREVACID) 30 MG capsule [id number] Yes Take 30 mg by mouth daily with breakfast.  [provider] Taking Active Spouse/Significant Other           Med Note Chilton Si, STACY L   Wed May 01, 2016  8:45 AM)    mirtazapine (REMERON SOL-TAB) 30 MG disintegrating tablet [id number] Yes TAKE 1 TABLET BY MOUTH EVERYDAY AT BEDTIME Donato Schultz, DO Taking Active   Multiple Vitamin (MULTIVITAMIN WITH MINERALS) TABS tablet [id number] Yes Take 1 tablet by mouth daily. Noralee Stain, DO Taking Active Spouse/Significant Other           Med Note Epimenio Sarin, Carlota Raspberry Mar 24, 2023  2:27 PM)    Pitavastatin Calcium 4 MG TABS [id number] Yes TAKE 1 TABLET BY MOUTH EVERY DAY Zola Button, Grayling Congress, DO Taking Active   rivastigmine (EXELON) 1.5 MG capsule [id number] Yes Take 1 capsule (1.5 mg total) by mouth 2 (two) times daily.  Patient taking differently: Take 1.5 mg by mouth 2 (two) times daily. 0600 and 1200   Nita Sickle K, DO Taking Active Spouse/Significant Other  sitaGLIPtin (JANUVIA) 50 MG tablet [id number] Yes Take 1 tablet (50 mg total) by mouth daily.  Patient taking differently: Take 100 mg by mouth daily.   Donato Schultz, DO Taking Active            Med Note (CRUTHIS, Carlota Raspberry Mar 24, 2023  2:27 PM)                Assessment/Plan:   Diabetes: Currently uncontrolled - Reviewed goal A1c, goal fasting, and goal 2 hour post prandial glucose - Reviewed dietary modifications including change to Glucerna or lower carbohydrate Ensure; Change applesauce to no sugar added  - Recommend to Start Tresiba 10 units once  a daily - Rx sent in.  Continue Januvia 100mg  daily. Need to get blood glucose down quickly but could consider SGLT2 in future.  Continue to use Novolog per sliding scale if needed.  - Reviewed s/s of hypoglycemia and how to treat.   Meds ordered this encounter  Medications   sitaGLIPtin (JANUVIA) 100 MG tablet    Sig: Take 1 tablet (100 mg total) by mouth daily.    Dispense:  90 tablet    Refill:  1    Dose increase   insulin degludec (TRESIBA FLEXTOUCH) 100 UNIT/ML FlexTouch Pen    Sig: Inject 10 Units into the skin daily.    Dispense:  15 mL    Refill:  1   glucose blood test strip    Sig: 1 each by Other route 4 (four) times daily. Dx: type 2 DM with long term insulin use E11.22; Z79.4    Dispense:  200 each    Refill:  2    -  Recommend to check glucose 4 times a day and updated Rx at pharmacy. Discussed Continuous Glucose Monitor but husband was afraid that pt would pull Continuous Glucose Monitor off. Offered to provide sample so she could try - could put Tegaderm over sensor to decrease likelihood that she would pull off - husband declined Continuous Glucose Monitor.     Hyperlipidemia/ASCVD Risk Reduction: Currently controlled.  - Recommend to Continue pitavastatin 4mg  daily      Follow Up Plan: 1 week to check blood glucose after starting [place] and to adjust insulin if needed.   Henrene Pastor, PharmD Clinical Pharmacist [facility]

## 2023-04-10 ENCOUNTER — Telehealth: Payer: Self-pay | Admitting: Family Medicine

## 2023-04-10 ENCOUNTER — Inpatient Hospital Stay (HOSPITAL_COMMUNITY)
Admission: EM | Admit: 2023-04-10 | Discharge: 2023-04-13 | DRG: 641 | Disposition: A | Discharge status: Home Health | Payer: Medicare HMO | Attending: Internal Medicine | Admitting: Internal Medicine

## 2023-04-10 ENCOUNTER — Encounter (HOSPITAL_COMMUNITY): Payer: Self-pay

## 2023-04-10 ENCOUNTER — Other Ambulatory Visit: Payer: Self-pay

## 2023-04-10 DIAGNOSIS — R1310 Dysphagia, unspecified: Secondary | ICD-10-CM | POA: Diagnosis present

## 2023-04-10 DIAGNOSIS — F03C0 Unspecified dementia, severe, without behavioral disturbance, psychotic disturbance, mood disturbance, and anxiety: Secondary | ICD-10-CM | POA: Diagnosis not present

## 2023-04-10 DIAGNOSIS — E8809 Other disorders of plasma-protein metabolism, not elsewhere classified: Secondary | ICD-10-CM | POA: Diagnosis present

## 2023-04-10 DIAGNOSIS — M810 Age-related osteoporosis without current pathological fracture: Secondary | ICD-10-CM | POA: Diagnosis present

## 2023-04-10 DIAGNOSIS — D649 Anemia, unspecified: Secondary | ICD-10-CM | POA: Diagnosis present

## 2023-04-10 DIAGNOSIS — E874 Mixed disorder of acid-base balance: Secondary | ICD-10-CM | POA: Diagnosis present

## 2023-04-10 DIAGNOSIS — Z9484 Stem cells transplant status: Secondary | ICD-10-CM

## 2023-04-10 DIAGNOSIS — E870 Hyperosmolality and hypernatremia: Principal | ICD-10-CM | POA: Diagnosis present

## 2023-04-10 DIAGNOSIS — R739 Hyperglycemia, unspecified: Principal | ICD-10-CM

## 2023-04-10 DIAGNOSIS — Z515 Encounter for palliative care: Secondary | ICD-10-CM | POA: Diagnosis not present

## 2023-04-10 DIAGNOSIS — E876 Hypokalemia: Secondary | ICD-10-CM | POA: Diagnosis present

## 2023-04-10 DIAGNOSIS — C9001 Multiple myeloma in remission: Secondary | ICD-10-CM | POA: Diagnosis present

## 2023-04-10 DIAGNOSIS — L89312 Pressure ulcer of right buttock, stage 2: Secondary | ICD-10-CM | POA: Diagnosis not present

## 2023-04-10 DIAGNOSIS — R627 Adult failure to thrive: Secondary | ICD-10-CM | POA: Diagnosis present

## 2023-04-10 DIAGNOSIS — Z882 Allergy status to sulfonamides status: Secondary | ICD-10-CM

## 2023-04-10 DIAGNOSIS — Z8249 Family history of ischemic heart disease and other diseases of the circulatory system: Secondary | ICD-10-CM

## 2023-04-10 DIAGNOSIS — R000 Tachycardia, unspecified: Secondary | ICD-10-CM | POA: Diagnosis present

## 2023-04-10 DIAGNOSIS — G9341 Metabolic encephalopathy: Secondary | ICD-10-CM | POA: Diagnosis not present

## 2023-04-10 DIAGNOSIS — Z7984 Long term (current) use of oral hypoglycemic drugs: Secondary | ICD-10-CM

## 2023-04-10 DIAGNOSIS — R7989 Other specified abnormal findings of blood chemistry: Secondary | ICD-10-CM | POA: Diagnosis not present

## 2023-04-10 DIAGNOSIS — Z888 Allergy status to other drugs, medicaments and biological substances status: Secondary | ICD-10-CM | POA: Diagnosis not present

## 2023-04-10 DIAGNOSIS — E1165 Type 2 diabetes mellitus with hyperglycemia: Secondary | ICD-10-CM | POA: Diagnosis present

## 2023-04-10 DIAGNOSIS — Z833 Family history of diabetes mellitus: Secondary | ICD-10-CM

## 2023-04-10 DIAGNOSIS — Z86010 Personal history of colonic polyps: Secondary | ICD-10-CM

## 2023-04-10 DIAGNOSIS — Z794 Long term (current) use of insulin: Secondary | ICD-10-CM | POA: Diagnosis not present

## 2023-04-10 DIAGNOSIS — E7800 Pure hypercholesterolemia, unspecified: Secondary | ICD-10-CM | POA: Diagnosis present

## 2023-04-10 DIAGNOSIS — D696 Thrombocytopenia, unspecified: Secondary | ICD-10-CM | POA: Diagnosis not present

## 2023-04-10 DIAGNOSIS — E860 Dehydration: Secondary | ICD-10-CM | POA: Diagnosis not present

## 2023-04-10 DIAGNOSIS — I10 Essential (primary) hypertension: Secondary | ICD-10-CM | POA: Diagnosis not present

## 2023-04-10 DIAGNOSIS — N179 Acute kidney failure, unspecified: Secondary | ICD-10-CM | POA: Diagnosis present

## 2023-04-10 DIAGNOSIS — Z808 Family history of malignant neoplasm of other organs or systems: Secondary | ICD-10-CM

## 2023-04-10 DIAGNOSIS — R531 Weakness: Secondary | ICD-10-CM | POA: Diagnosis not present

## 2023-04-10 DIAGNOSIS — Z79899 Other long term (current) drug therapy: Secondary | ICD-10-CM

## 2023-04-10 DIAGNOSIS — Z9221 Personal history of antineoplastic chemotherapy: Secondary | ICD-10-CM

## 2023-04-10 DIAGNOSIS — Z801 Family history of malignant neoplasm of trachea, bronchus and lung: Secondary | ICD-10-CM

## 2023-04-10 LAB — BASIC METABOLIC PANEL
Anion gap: 18 — ABNORMAL HIGH (ref 5–15)
BUN: 36 mg/dL — ABNORMAL HIGH (ref 8–23)
CO2: 22 mmol/L (ref 22–32)
Calcium: 9.5 mg/dL (ref 8.9–10.3)
Chloride: 116 mmol/L — ABNORMAL HIGH (ref 98–111)
Creatinine, Ser: 1.47 mg/dL — ABNORMAL HIGH (ref 0.44–1.00)
GFR, Estimated: 38 mL/min — ABNORMAL LOW (ref >60)
Glucose, Bld: 460 mg/dL — ABNORMAL HIGH (ref 70–99)
Potassium: 4.4 mmol/L (ref 3.5–5.1)
Sodium: 156 mmol/L — ABNORMAL HIGH (ref 135–145)

## 2023-04-10 LAB — CBC WITH DIFFERENTIAL/PLATELET
Abs Immature Granulocytes: 0.03 10*3/uL (ref 0.00–0.07)
Basophils Absolute: 0 10*3/uL (ref 0.0–0.1)
Basophils Relative: 0 %
Eosinophils Absolute: 0 10*3/uL (ref 0.0–0.5)
Eosinophils Relative: 0 %
HCT: 46.4 % — ABNORMAL HIGH (ref 36.0–46.0)
Hemoglobin: 14.1 g/dL (ref 12.0–15.0)
Immature Granulocytes: 0 %
Lymphocytes Relative: 19 %
Lymphs Abs: 1.7 10*3/uL (ref 0.7–4.0)
MCH: 27.2 pg (ref 26.0–34.0)
MCHC: 30.4 g/dL (ref 30.0–36.0)
MCV: 89.4 fL (ref 80.0–100.0)
Monocytes Absolute: 0.7 10*3/uL (ref 0.1–1.0)
Monocytes Relative: 7 %
Neutro Abs: 6.4 10*3/uL (ref 1.7–7.7)
Neutrophils Relative %: 74 %
Platelets: 98 10*3/uL — ABNORMAL LOW (ref 150–400)
RBC: 5.19 MIL/uL — ABNORMAL HIGH (ref 3.87–5.11)
RDW: 14 % (ref 11.5–15.5)
WBC: 8.8 10*3/uL (ref 4.0–10.5)
nRBC: 0 % (ref 0.0–0.2)

## 2023-04-10 LAB — I-STAT VENOUS BLOOD GAS, ED
Acid-Base Excess: 2 mmol/L (ref 0.0–2.0)
Bicarbonate: 25.4 mmol/L (ref 20.0–28.0)
Calcium, Ion: 1.13 mmol/L — ABNORMAL LOW (ref 1.15–1.40)
HCT: 43 % (ref 36.0–46.0)
Hemoglobin: 14.6 g/dL (ref 12.0–15.0)
O2 Saturation: 52 %
Potassium: 4.2 mmol/L (ref 3.5–5.1)
Sodium: 158 mmol/L — ABNORMAL HIGH (ref 135–145)
TCO2: 26 mmol/L (ref 22–32)
pCO2, Ven: 36.2 mmHg — ABNORMAL LOW (ref 44–60)
pH, Ven: 7.454 — ABNORMAL HIGH (ref 7.25–7.43)
pO2, Ven: 26 mmHg — CL (ref 32–45)

## 2023-04-10 LAB — BETA-HYDROXYBUTYRIC ACID: Beta-Hydroxybutyric Acid: 0.61 mmol/L — ABNORMAL HIGH (ref 0.05–0.27)

## 2023-04-10 LAB — GLUCOSE, CAPILLARY
Glucose-Capillary: 236 mg/dL — ABNORMAL HIGH (ref 70–99)
Glucose-Capillary: 252 mg/dL — ABNORMAL HIGH (ref 70–99)

## 2023-04-10 LAB — OSMOLALITY: Osmolality: 369 mOsm/kg (ref 275–295)

## 2023-04-10 LAB — CBG MONITORING, ED: Glucose-Capillary: 387 mg/dL — ABNORMAL HIGH (ref 70–99)

## 2023-04-10 MED ORDER — RIVASTIGMINE TARTRATE 1.5 MG PO CAPS
1.5000 mg | ORAL_CAPSULE | Freq: Two times a day (BID) | ORAL | Status: DC
  Administered 2023-04-11 – 2023-04-13 (×4): 1.5 mg via ORAL
  Filled 2023-04-10 (×7): qty 1

## 2023-04-10 MED ORDER — SODIUM CHLORIDE 0.45 % IV SOLN: INTRAVENOUS | Status: DC

## 2023-04-10 MED ORDER — DOCUSATE SODIUM 100 MG PO CAPS
200.0000 mg | ORAL_CAPSULE | Freq: Every day | ORAL | Status: DC
  Administered 2023-04-12 – 2023-04-13 (×2): 200 mg via ORAL
  Filled 2023-04-10 (×3): qty 2

## 2023-04-10 MED ORDER — INSULIN GLARGINE-YFGN 100 UNIT/ML ~~LOC~~ SOLN
15.0000 [IU] | Freq: Every day | SUBCUTANEOUS | Status: DC
  Administered 2023-04-11 – 2023-04-13 (×3): 15 [IU] via SUBCUTANEOUS
  Filled 2023-04-10 (×4): qty 0.15

## 2023-04-10 MED ORDER — INSULIN ASPART 100 UNIT/ML IJ SOLN: 10.0000 [IU] | Freq: Three times a day (TID) | INTRAMUSCULAR | Status: DC

## 2023-04-10 MED ORDER — ONDANSETRON HCL 4 MG PO TABS: 4.0000 mg | ORAL_TABLET | Freq: Four times a day (QID) | ORAL | Status: DC | PRN

## 2023-04-10 MED ORDER — LINAGLIPTIN 5 MG PO TABS
5.0000 mg | ORAL_TABLET | Freq: Every day | ORAL | Status: DC
  Filled 2023-04-10: qty 1

## 2023-04-10 MED ORDER — ONDANSETRON HCL 4 MG/2ML IJ SOLN: 4.0000 mg | Freq: Four times a day (QID) | INTRAMUSCULAR | Status: DC | PRN

## 2023-04-10 MED ORDER — LACTATED RINGERS IV BOLUS
1000.0000 mL | Freq: Once | INTRAVENOUS | Status: AC
  Administered 2023-04-10: 1000 mL via INTRAVENOUS

## 2023-04-10 MED ORDER — PANTOPRAZOLE SODIUM 20 MG PO TBEC
20.0000 mg | DELAYED_RELEASE_TABLET | Freq: Every day | ORAL | Status: DC
  Administered 2023-04-12 – 2023-04-13 (×2): 20 mg via ORAL
  Filled 2023-04-10 (×3): qty 1

## 2023-04-10 MED ORDER — ENOXAPARIN SODIUM 40 MG/0.4ML IJ SOSY
40.0000 mg | PREFILLED_SYRINGE | INTRAMUSCULAR | Status: DC
  Administered 2023-04-11 – 2023-04-12 (×2): 40 mg via SUBCUTANEOUS
  Filled 2023-04-10 (×2): qty 0.4

## 2023-04-10 MED ORDER — GLUCERNA SHAKE PO LIQD
237.0000 mL | Freq: Three times a day (TID) | ORAL | Status: DC
  Administered 2023-04-11 – 2023-04-13 (×5): 237 mL via ORAL
  Filled 2023-04-10: qty 237

## 2023-04-10 MED ORDER — MIRTAZAPINE 30 MG PO TBDP
30.0000 mg | ORAL_TABLET | Freq: Every day | ORAL | Status: DC
  Administered 2023-04-11 – 2023-04-12 (×2): 30 mg via ORAL
  Filled 2023-04-10 (×4): qty 1

## 2023-04-10 MED ORDER — ENSURE ENLIVE PO LIQD: 237.0000 mL | Freq: Three times a day (TID) | ORAL | Status: DC

## 2023-04-10 MED ORDER — INSULIN ASPART 100 UNIT/ML IJ SOLN
0.0000 [IU] | Freq: Every day | INTRAMUSCULAR | Status: DC
  Administered 2023-04-10: 3 [IU] via SUBCUTANEOUS
  Administered 2023-04-11: 2 [IU] via SUBCUTANEOUS
  Administered 2023-04-12: 4 [IU] via SUBCUTANEOUS

## 2023-04-10 MED ORDER — INSULIN ASPART 100 UNIT/ML IJ SOLN
5.0000 [IU] | INTRAMUSCULAR | Status: AC
  Administered 2023-04-10: 5 [IU] via SUBCUTANEOUS

## 2023-04-10 MED ORDER — INSULIN ASPART 100 UNIT/ML IJ SOLN
8.0000 [IU] | Freq: Three times a day (TID) | INTRAMUSCULAR | Status: DC
  Administered 2023-04-10 – 2023-04-11 (×3): 8 [IU] via SUBCUTANEOUS

## 2023-04-10 MED ORDER — PRAVASTATIN SODIUM 40 MG PO TABS: 80.0000 mg | ORAL_TABLET | Freq: Every day | ORAL | Status: DC

## 2023-04-10 MED ORDER — INSULIN ASPART 100 UNIT/ML IJ SOLN
0.0000 [IU] | Freq: Three times a day (TID) | INTRAMUSCULAR | Status: DC
  Administered 2023-04-10: 5 [IU] via SUBCUTANEOUS
  Administered 2023-04-11: 3 [IU] via SUBCUTANEOUS
  Administered 2023-04-11 (×2): 2 [IU] via SUBCUTANEOUS
  Administered 2023-04-12: 5 [IU] via SUBCUTANEOUS
  Administered 2023-04-12 (×2): 11 [IU] via SUBCUTANEOUS
  Administered 2023-04-13: 8 [IU] via SUBCUTANEOUS
  Administered 2023-04-13: 3 [IU] via SUBCUTANEOUS

## 2023-04-10 NOTE — ED Notes (Signed)
ED TO INPATIENT HANDOFF REPORT  ED Nurse Name and Phone #: Jenevie Casstevens RN [phone]  S Name/Age/Gender Victoria Gates 72 y.o. female Room/Bed: 011C/011C  Code Status   Code Status: Full Code  Home/SNF/Other Home Patient oriented to: self Is this baseline? Yes   Triage Complete: Triage complete  Chief Complaint Hypernatremia [E87.0]  Triage Note Patient BIB EMS from home today with reports of high blood sugar. Husband states he gives her insulin but has not been able to lower her blood sugar. Patient is baseline non verbal and has dementia. Blood sugar by EMS today was 580. Patient was sinus tach for EMS today. 500 NS bolus in route. This is baseline per husbands report.    Allergies Allergies  Allergen Reactions   Sulfonamide Derivatives Other (See Comments)    Unknown reaction    Atorvastatin Other (See Comments)    myalgias   Rosuvastatin Other (See Comments)    myalgias    Level of Care/Admitting Diagnosis ED Disposition     ED Disposition  Admit   Condition  --   Comment  Hospital Area: [facility] [[id number]]  Level of Care: Med-Surg [16]  May place patient in observation at [facility] or [facility] if equivalent level of care is available:: No  Covid Evaluation: Asymptomatic - no recent exposure (last 10 days) testing not required  Diagnosis: Hypernatremia [829562]  Admitting Physician: Emeline General [[id number]]  Attending Physician: Emeline General [[id number]]          B Medical/Surgery History Past Medical History:  Diagnosis Date   Allergic rhinitis 01/02/2009   Bradycardia 04/29/2016   Cervical Radiculopathy 02/01/2010   Left side   Chest pain 04/15/2012   Ex MV: Ex time 10 mins, EF 63%, no ischemia/infarct. Occas PAC's/PVC's.   Dementia (HCC)    Diabetes mellitus, type II 03/18/2008   Encounter for antineoplastic chemotherapy 04/29/2016   Esophageal Stricture 04/15/2007   s/p dilitation   GERD (gastroesophageal reflux  disease) 09/21/2010   Herpes zoster 11/05/2010   Hiatal Hernia 04/15/2007   History of colonic polyps 04/24/2007   Hyperplastic only   Hypercholesterolemia 07/03/2009   Hypokalemia 11/30/2015   Iron deficiency anemia 04/24/2007   Multiple myeloma (HCC) 02/2014   Osteoporosis 04/24/2007   Past Surgical History:  Procedure Laterality Date   ESOPHAGOGASTRODUODENOSCOPY  04/15/2007   TUBAL LIGATION       A IV Location/Drains/Wounds Patient Lines/Drains/Airways Status     Active Line/Drains/Airways     Name Placement date Placement time Site Days   Peripheral IV 04/10/23 20 G Anterior;Distal;Left Forearm 04/10/23  --  Forearm  less than 1   Pressure Injury 08/07/22 Sacrum Stage 2 -  Partial thickness loss of dermis presenting as a shallow open injury with a red, pink wound bed without slough. 08/07/22  1700  -- 246   Pressure Injury 04/10/23 Buttocks Right Stage 2 -  Partial thickness loss of dermis presenting as a shallow open injury with a red, pink wound bed without slough. 04/10/23  --  -- less than 1            Intake/Output Last 24 hours  Intake/Output Summary (Last 24 hours) at 04/10/2023 1442 Last data filed at 04/10/2023 1056 Gross per 24 hour  Intake 500 ml  Output --  Net 500 ml    Labs/Imaging Results for orders placed or performed during the hospital encounter of 04/10/23 (from the past 48 hour(s))  Basic metabolic panel  Status: Abnormal   Collection Time: 04/10/23 10:53 AM  Result Value Ref Range   Sodium 156 (H) 135 - 145 mmol/L   Potassium 4.4 3.5 - 5.1 mmol/L   Chloride 116 (H) 98 - 111 mmol/L   CO2 22 22 - 32 mmol/L   Glucose, Bld 460 (H) 70 - 99 mg/dL    Comment: Glucose reference range applies only to samples taken after fasting for at least 8 hours.   BUN 36 (H) 8 - 23 mg/dL   Creatinine, Ser 1.61 (H) 0.44 - 1.00 mg/dL   Calcium 9.5 8.9 - 09.6 mg/dL   GFR, Estimated 38 (L) >60 mL/min    Comment: (NOTE) Calculated using the CKD-EPI Creatinine  Equation (2021)    Anion gap 18 (H) 5 - 15    Comment: Performed at [facility], [address], [place], [place] [address]  CBC with Differential (PNL)     Status: Abnormal   Collection Time: 04/10/23 10:53 AM  Result Value Ref Range   WBC 8.8 4.0 - 10.5 K/uL   RBC 5.19 (H) 3.87 - 5.11 MIL/uL   Hemoglobin 14.1 12.0 - 15.0 g/dL   HCT 98.1 (H) 19.1 - 47.8 %   MCV 89.4 80.0 - 100.0 fL   MCH 27.2 26.0 - 34.0 pg   MCHC 30.4 30.0 - 36.0 g/dL   RDW 29.5 62.1 - 30.8 %   Platelets 98 (L) 150 - 400 K/uL    Comment: Immature Platelet Fraction may be clinically indicated, consider ordering this additional test [id number] REPEATED TO VERIFY PLATELET COUNT CONFIRMED BY SMEAR    nRBC 0.0 0.0 - 0.2 %   Neutrophils Relative % 74 %   Neutro Abs 6.4 1.7 - 7.7 K/uL   Lymphocytes Relative 19 %   Lymphs Abs 1.7 0.7 - 4.0 K/uL   Monocytes Relative 7 %   Monocytes Absolute 0.7 0.1 - 1.0 K/uL   Eosinophils Relative 0 %   Eosinophils Absolute 0.0 0.0 - 0.5 K/uL   Basophils Relative 0 %   Basophils Absolute 0.0 0.0 - 0.1 K/uL   Immature Granulocytes 0 %   Abs Immature Granulocytes 0.03 0.00 - 0.07 K/uL    Comment: Performed at [facility], [address], [place], [place] [address]  Osmolality     Status: Abnormal   Collection Time: 04/10/23 10:53 AM  Result Value Ref Range   Osmolality 369 (HH) 275 - 295 mOsm/kg    Comment: REPEATED TO VERIFY CRITICAL RESULT CALLED TO, READ BACK BY AND VERIFIED WITH: Lavetta Nielsen RN 04/10/2023 1245 SANDOVAL K  Performed at [facility], [address], [place], [place] [address]   POC CBG, ED     Status: Abnormal   Collection Time: 04/10/23 10:54 AM  Result Value Ref Range   Glucose-Capillary 387 (H) 70 - 99 mg/dL    Comment: Glucose reference range applies only to samples taken after fasting for at least 8 hours.  I-Stat Venous Blood Gas, ED     Status: Abnormal   Collection Time: 04/10/23 11:28 AM  Result Value Ref Range   pH, Ven  7.454 (H) 7.25 - 7.43   pCO2, Ven 36.2 (L) 44 - 60 mmHg   pO2, Ven 26 (LL) 32 - 45 mmHg   Bicarbonate 25.4 20.0 - 28.0 mmol/L   TCO2 26 22 - 32 mmol/L   O2 Saturation 52 %   Acid-Base Excess 2.0 0.0 - 2.0 mmol/L   Sodium 158 (  H) 135 - 145 mmol/L   Potassium 4.2 3.5 - 5.1 mmol/L   Calcium, Ion 1.13 (L) 1.15 - 1.40 mmol/L   HCT 43.0 36.0 - 46.0 %   Hemoglobin 14.6 12.0 - 15.0 g/dL   Sample type VENOUS    Comment NOTIFIED PHYSICIAN   Beta-hydroxybutyric acid     Status: Abnormal   Collection Time: 04/10/23 11:40 AM  Result Value Ref Range   Beta-Hydroxybutyric Acid 0.61 (H) 0.05 - 0.27 mmol/L    Comment: Performed at [facility], [address], [place], [place] [address]   No results found.  Pending Labs Unresulted Labs (From admission, onward)     Start     Ordered   04/11/23 0500  Basic metabolic panel  Tomorrow morning,   R        04/10/23 1337   04/11/23 0500  CBC  Tomorrow morning,   R        04/10/23 1337   04/10/23 1053  Urinalysis, Routine w reflex microscopic -Urine, Clean Catch  (Hyperglycemia (not DKA or HHS))  ONCE - STAT,   URGENT       Question:  Specimen Source  Answer:  Urine, Clean Catch   04/10/23 1052            Vitals/Pain Today's Vitals   04/10/23 1145 04/10/23 1200 04/10/23 1215 04/10/23 1230  BP: 122/80 121/84 (!) 113/92 100/72  Pulse: 97 87 94 90  Resp: (!) 23 13 19 16   Temp:      TempSrc:      SpO2: 94% 98% 100% 100%    Isolation Precautions No active isolations  Medications Medications  0.45 % sodium chloride infusion (has no administration in time range)  insulin glargine-yfgn (SEMGLEE) injection 15 Units (has no administration in time range)  pravastatin (PRAVACHOL) tablet 80 mg (has no administration in time range)  mirtazapine (REMERON SOL-TAB) disintegrating tablet 30 mg (has no administration in time range)  rivastigmine (EXELON) capsule 1.5 mg (has no administration in time range)  docusate sodium (COLACE) capsule 200  mg (has no administration in time range)  pantoprazole (PROTONIX) EC tablet 20 mg (has no administration in time range)  feeding supplement (ENSURE COMPLETE) (ENSURE COMPLETE) liquid 237 mL (has no administration in time range)  linagliptin (TRADJENTA) tablet 5 mg (has no administration in time range)  enoxaparin (LOVENOX) injection 40 mg (has no administration in time range)  ondansetron (ZOFRAN) tablet 4 mg (has no administration in time range)    Or  ondansetron (ZOFRAN) injection 4 mg (has no administration in time range)  insulin aspart (novoLOG) injection 0-15 Units (has no administration in time range)  insulin aspart (novoLOG) injection 0-5 Units (has no administration in time range)  insulin aspart (novoLOG) injection 8 Units (has no administration in time range)  lactated ringers bolus 1,000 mL (1,000 mLs Intravenous New Bag/Given 04/10/23 1206)  lactated ringers bolus 1,000 mL (1,000 mLs Intravenous New Bag/Given 04/10/23 1235)  insulin aspart (novoLOG) injection 5 Units (5 Units Subcutaneous Given 04/10/23 1235)    Mobility walks with device     Focused Assessments    R Recommendations: See Admitting Provider Note  Report given to:   Additional Notes: pt has dementia but husband states she is at baseline currently. She can answer yes or no questions. Husband states she can walk at home with some assistance.

## 2023-04-10 NOTE — Telephone Encounter (Signed)
Pt husband called requesting a callback to discuss glucose readings. He says its normal in the morning then it increases during the day & drops again at night. He just wants to make sure he checking it correctly. Please advise.

## 2023-04-10 NOTE — ED Triage Notes (Signed)
Patient BIB EMS from home today with reports of high blood sugar. Husband states he gives her insulin but has not been able to lower her blood sugar. Patient is baseline non verbal and has dementia. Blood sugar by EMS today was 580. Patient was sinus tach for EMS today. 500 NS bolus in route. This is baseline per husbands report.

## 2023-04-10 NOTE — ED Notes (Signed)
Blood sugar was 387

## 2023-04-10 NOTE — Plan of Care (Signed)
Patient disoriented X4 on admission. Patient VSS upon admission and blood sugar 236. Patient properly covered with insulin administration. Patient tolerating continuous IV fluid infusion. Patient skin assessed with Autumn, RN. Sacral wound documented. Patient bed alarm in place, bed in lowest position, husband at bedside. Will continue to monitor.   Problem: Education: Goal: Ability to describe self-care measures that may prevent or decrease complications (Diabetes Survival Skills Education) will improve Outcome: Progressing   Problem: Education: Goal: Individualized Educational Video(s) Outcome: Progressing   Problem: Coping: Goal: Ability to adjust to condition or change in health will improve Outcome: Progressing   Problem: Fluid Volume: Goal: Ability to maintain a balanced intake and output will improve Outcome: Progressing   Problem: Health Behavior/Discharge Planning: Goal: Ability to identify and utilize available resources and services will improve Outcome: Progressing   Problem: Health Behavior/Discharge Planning: Goal: Ability to manage health-related needs will improve Outcome: Progressing   Problem: Metabolic: Goal: Ability to maintain appropriate glucose levels will improve Outcome: Progressing   Problem: Nutritional: Goal: Maintenance of adequate nutrition will improve Outcome: Progressing   Problem: Nutritional: Goal: Progress toward achieving an optimal weight will improve Outcome: Progressing   Problem: Skin Integrity: Goal: Risk for impaired skin integrity will decrease Outcome: Progressing   Problem: Tissue Perfusion: Goal: Adequacy of tissue perfusion will improve Outcome: Progressing

## 2023-04-10 NOTE — ED Notes (Signed)
Serum osmal 369

## 2023-04-10 NOTE — H&P (Addendum)
History and Physical    Victoria Gates WJX:[id number] DOB: [birth year] DOA: 04/10/2023  PCP: Donato Schultz, DO (Confirm with patient/family/NH records and if not entered, this has to be entered at [facility] point of entry) Patient coming from: Home  I have personally briefly reviewed patient's old medical records in [facility]  Chief Complaint: Uncontrolled diabetes  HPI: Victoria Gates is a 72 y.o. female with medical history significant of IIDM who was recently started on insulin, morbid obesity, advanced dementia, multiple myeloma in remission, brought in by family members for poorly controlled diabetes.  Patient has a longstanding insulin independent diabetes, managed with only Januvia p.o. 50 mg daily.  Starting in May this year, family started noticed patient glucose become poorly controlled with most readings above 300 at home.  She was hospitalized 2 weeks ago for uncontrolled diabetes and was discharged on Trebisa 10 units daily and after 1 week family reported continuous increased glucose of more than 300 readings at home.  2 days ago she was started on sliding scale, and family reported slightly improved glucose control however patient has had poor p.o. intake since starting on insulin 2 weeks ago and became dehydrated.  Last week, family also noticed patient has had increased p.o. fluid intake and increased urine output however this week has been very opposite end with decreased fluid intake and decreased urine output.  Since November last year, patient has had a dental procedure and a temporary implant placed in, after which patient can only eat pured diet.  Family reported acute appetite and patient gained about 20 pounds compared to November 2023.  Currently, patient has no major complaints, denies any pain nauseous vomiting diarrhea.  ED Course: Borderline tachycardia, nonhypotensive nonhypoxic.  Sodium 156, Glu=460, creatinine 1.47 compared to 1.0 at baseline about 1 month  ago.  Review of Systems: Unable to perform, patient has advanced dementia  Past Medical History:  Diagnosis Date   Allergic rhinitis 01/02/2009   Bradycardia 04/29/2016   Cervical Radiculopathy 02/01/2010   Left side   Chest pain 04/15/2012   Ex MV: Ex time 10 mins, EF 63%, no ischemia/infarct. Occas PAC's/PVC's.   Dementia (HCC)    Diabetes mellitus, type II 03/18/2008   Encounter for antineoplastic chemotherapy 04/29/2016   Esophageal Stricture 04/15/2007   s/p dilitation   GERD (gastroesophageal reflux disease) 09/21/2010   Herpes zoster 11/05/2010   Hiatal Hernia 04/15/2007   History of colonic polyps 04/24/2007   Hyperplastic only   Hypercholesterolemia 07/03/2009   Hypokalemia 11/30/2015   Iron deficiency anemia 04/24/2007   Multiple myeloma (HCC) 02/2014   Osteoporosis 04/24/2007    Past Surgical History:  Procedure Laterality Date   ESOPHAGOGASTRODUODENOSCOPY  04/15/2007   TUBAL LIGATION       reports that she has never smoked. She has never used smokeless tobacco. She reports that she does not drink alcohol and does not use drugs.  Allergies  Allergen Reactions   Sulfonamide Derivatives Other (See Comments)    Unknown reaction    Atorvastatin Other (See Comments)    myalgias   Rosuvastatin Other (See Comments)    myalgias    Family History  Problem Relation Age of Onset   Brain cancer Mother    Heart disease Mother    Lung cancer Father    Heart disease Father    Diabetes Sister    Heart disease Sister    Heart attack Sister    ALS Sister    Diabetes Brother  Heart disease Brother    Cancer Neg Hx        No FH of Colon Cancer   Colon cancer Neg Hx    Esophageal cancer Neg Hx    Rectal cancer Neg Hx    Stomach cancer Neg Hx      Prior to Admission medications   Medication Sig Start Date End Date Taking? Authorizing Provider  docusate sodium (COLACE) 100 MG capsule Take 100 mg by mouth daily.   Yes [provider]  feeding  supplement, ENSURE COMPLETE, (ENSURE COMPLETE) LIQD Take 237 mLs by mouth 3 (three) times daily.   Yes [provider]  insulin degludec (TRESIBA FLEXTOUCH) 100 UNIT/ML FlexTouch Pen Inject 10 Units into the skin daily. 04/07/23  Yes Seabron Spates R, DO  insulin lispro (HUMALOG KWIKPEN) 100 UNIT/ML KwikPen PER SLIDING SCALE----- NO MORE THAN 40 U / DAY Patient taking differently: Inject 0-40 Units into the skin 3 (three) times daily as needed (Blood Sugar). PER SLIDING SCALE----- NO MORE THAN 40 U / DAY 03/31/23  Yes Seabron Spates R, DO  lansoprazole (PREVACID) 30 MG capsule Take 30 mg by mouth daily with breakfast.    Yes [provider]  mirtazapine (REMERON SOL-TAB) 30 MG disintegrating tablet TAKE 1 TABLET BY MOUTH EVERYDAY AT BEDTIME 02/17/23  Yes Donato Schultz, DO  Multiple Vitamin (MULTIVITAMIN WITH MINERALS) TABS tablet Take 1 tablet by mouth daily. 08/05/22  Yes Noralee Stain, DO  Pitavastatin Calcium 4 MG TABS TAKE 1 TABLET BY MOUTH EVERY DAY 02/17/23  Yes Seabron Spates R, DO  rivastigmine (EXELON) 1.5 MG capsule Take 1 capsule (1.5 mg total) by mouth 2 (two) times daily. Patient taking differently: Take 1.5 mg by mouth 2 (two) times daily. 0600 and 1200 05/15/22  Yes Patel, Donika K, DO  sitaGLIPtin (JANUVIA) 100 MG tablet Take 1 tablet (100 mg total) by mouth daily. 04/07/23  Yes Seabron Spates R, DO  glucose blood test strip 1 each by Other route 4 (four) times daily. Dx: type 2 DM with long term insulin use E11.22; Z79.4 04/07/23   Zola Button, Grayling Congress, DO  Insulin Pen Needle (NOVOFINE PEN NEEDLE) 32G X 6 MM MISC As directed 03/31/23   Donato Schultz, DO    Physical Exam: Vitals:   04/10/23 1145 04/10/23 1200 04/10/23 1215 04/10/23 1230  BP: 122/80 121/84 (!) 113/92 100/72  Pulse: 97 87 94 90  Resp: (!) 23 13 19 16   Temp:      TempSrc:      SpO2: 94% 98% 100% 100%    Constitutional: NAD, calm, comfortable Vitals:   04/10/23 1145  04/10/23 1200 04/10/23 1215 04/10/23 1230  BP: 122/80 121/84 (!) 113/92 100/72  Pulse: 97 87 94 90  Resp: (!) 23 13 19 16   Temp:      TempSrc:      SpO2: 94% 98% 100% 100%   Eyes: PERRL, lids and conjunctivae normal ENMT: Mucous membranes are dry. Posterior pharynx clear of any exudate or lesions.Normal dentition.  Neck: normal, supple, no masses, no thyromegaly Respiratory: clear to auscultation bilaterally, no wheezing, no crackles. Normal respiratory effort. No accessory muscle use.  Cardiovascular: Regular rate and rhythm, no murmurs / rubs / gallops. No extremity edema. 2+ pedal pulses. No carotid bruits.  Abdomen: no tenderness, no masses palpated. No hepatosplenomegaly. Bowel sounds positive.  Musculoskeletal: no clubbing / cyanosis. No joint deformity upper and lower extremities. Good ROM, no contractures. Normal  muscle tone.  Skin: Right gluteal stage II ulcer small, clean-based Neurologic: No facial droops, moving all limbs, following simple commands Psychiatric: Awake, not communicative     Labs on Admission: I have personally reviewed following labs and imaging studies  CBC: Recent Labs  Lab 04/10/23 1053 04/10/23 1128  WBC 8.8  --   NEUTROABS 6.4  --   HGB 14.1 14.6  HCT 46.4* 43.0  MCV 89.4  --   PLT 98*  --    Basic Metabolic Panel: Recent Labs  Lab 04/10/23 1053 04/10/23 1128  NA 156* 158*  K 4.4 4.2  CL 116*  --   CO2 22  --   GLUCOSE 460*  --   BUN 36*  --   CREATININE 1.47*  --   CALCIUM 9.5  --    GFR: Estimated Creatinine Clearance: 39 mL/min (A) (by C-G formula based on SCr of 1.47 mg/dL (H)). Liver Function Tests: No results for input(s): "AST", "ALT", "ALKPHOS", "BILITOT", "PROT", "ALBUMIN" in the last 168 hours. No results for input(s): "LIPASE", "AMYLASE" in the last 168 hours. No results for input(s): "AMMONIA" in the last 168 hours. Coagulation Profile: No results for input(s): "INR", "PROTIME" in the last 168 hours. Cardiac  Enzymes: No results for input(s): "CKTOTAL", "CKMB", "CKMBINDEX", "TROPONINI" in the last 168 hours. BNP (last 3 results) No results for input(s): "PROBNP" in the last 8760 hours. HbA1C: No results for input(s): "HGBA1C" in the last 72 hours. CBG: No results for input(s): "GLUCAP" in the last 168 hours. Lipid Profile: No results for input(s): "CHOL", "HDL", "LDLCALC", "TRIG", "CHOLHDL", "LDLDIRECT" in the last 72 hours. Thyroid Function Tests: No results for input(s): "TSH", "T4TOTAL", "FREET4", "T3FREE", "THYROIDAB" in the last 72 hours. Anemia Panel: No results for input(s): "VITAMINB12", "FOLATE", "FERRITIN", "TIBC", "IRON", "RETICCTPCT" in the last 72 hours. Urine analysis:    Component Value Date/Time   COLORURINE YELLOW 03/28/2023 2325   APPEARANCEUR CLEAR 03/28/2023 2325   LABSPEC 1.029 03/28/2023 2325   PHURINE 5.0 03/28/2023 2325   GLUCOSEU >=500 (A) 03/28/2023 2325   GLUCOSEU NEGATIVE 09/01/2017 0847   HGBUR NEGATIVE 03/28/2023 2325   BILIRUBINUR NEGATIVE 03/28/2023 2325   KETONESUR NEGATIVE 03/28/2023 2325   PROTEINUR NEGATIVE 03/28/2023 2325   UROBILINOGEN 0.2 09/01/2017 0847   NITRITE NEGATIVE 03/28/2023 2325   LEUKOCYTESUR SMALL (A) 03/28/2023 2325    Radiological Exams on Admission: No results found.  EKG: None  Assessment/Plan Principal Problem:   Hypernatremia Active Problems:   AKI (acute kidney injury) (HCC)   Acute metabolic encephalopathy  (please populate well all problems here in Problem List. (For example, if patient is on BP meds at home and you resume or decide to hold them, it is a problem that needs to be her. Same for CAD, COPD, HLD and so on)  Severe dehydration Acute hypernatremia -Corrected sodium level 162, calculated free water deficit 5.9 L.  Plan to correct in 2 days, with a rate of 10-12 mmol/day, for now will change IV fluid to half-normal saline at 150 MLS per hour, will encourage increased p.o. fluid intake -Recheck Na level  every 6 hours today and daily BMP  Starvation ketoacidosis Combined decompensated high anion gap metabolic acidosis and acute respiratory alkalosis -Correct volume status and fluid level then reevaluate  IDDM with hyperglycemia -Clinically suspect loss of insulin sensitivity due to significant weight gain since November last year. -Increase long-acting insulin to 15 units daily, and start NovoLog 8 unit 3 times daily AC, plus a sliding  scale -Continue Januvia -Consider outpatient Ozempic  AKI -Prerenal likely secondary to severe dehydration, hydration plan as above  Hx of MM -In remission, last follow-up with oncology was in May 2024 and was told condition was stable.  Advanced dementia -Mentation at baseline  Stage II right gluteal pressure ulcer -No signs of infection -Consult wound care  Chronic dysphagia -Stable on pured diet  DVT prophylaxis: Lovenox Code Status: Full code Family Communication: Husband and daughter present Disposition Plan: Expect less than 2 midnight hospital stay Consults called: None Admission status: Medsurg obs   Emeline General MD [facility] Pager [phone]  04/10/2023, 1:47 PM

## 2023-04-10 NOTE — ED Provider Notes (Signed)
[place] EMERGENCY DEPARTMENT AT [facility] Provider Note   CSN: [id number] Arrival date & time: 04/10/23  1045     History  Chief Complaint  Patient presents with   Hyperglycemia    Victoria Gates is a 72 y.o. female.  72 yo F with advanced dementia who is non-verbal, IDDM, HTN, and HLD who presents to the emergency department with elevated blood sugars. Last night 588. Have been elevated since yesterday and having a difficult time controlling it. Has been her normal self. Takes januvia 100 mg pill daily, humilog sliding scale, and tresiba 10 units daily in the morning (started this week). Since yesterday has been looking at him spaced out as if she's on drugs or something. 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 Does not smoke, drink or do any drugs.  EMS gave her 500 cc bolus en route.  Otherwise is at baseline mental status (typically only oriented to self).  History obtained per patient's husband and daughter.       Home Medications Prior to Admission medications   Medication Sig Start Date End Date Taking? Authorizing Provider  docusate sodium (COLACE) 100 MG capsule Take 200 mg by mouth daily.    [provider]  feeding supplement, ENSURE COMPLETE, (ENSURE COMPLETE) LIQD Take 237 mLs by mouth 3 (three) times daily between meals.    [provider]  glucose blood test strip 1 each by Other route 4 (four) times daily. Dx: type 2 DM with long term insulin use E11.22; Z79.4 04/07/23   Zola Button, Grayling Congress, DO  insulin degludec (TRESIBA FLEXTOUCH) 100 UNIT/ML FlexTouch Pen Inject 10 Units into the skin daily. 04/07/23   Zola Button, Myrene Buddy R, DO  insulin lispro (HUMALOG KWIKPEN) 100 UNIT/ML KwikPen PER SLIDING SCALE----- NO MORE THAN 40 U / DAY 03/31/23   Zola Button, Myrene Buddy R, DO  Insulin Pen Needle (NOVOFINE PEN NEEDLE) 32G X 6 MM MISC As directed 03/31/23   Zola Button, Grayling Congress, DO  lansoprazole (PREVACID) 30 MG capsule Take 30 mg by mouth  daily with breakfast.     [provider]  mirtazapine (REMERON SOL-TAB) 30 MG disintegrating tablet TAKE 1 TABLET BY MOUTH EVERYDAY AT BEDTIME 02/17/23   Donato Schultz, DO  Multiple Vitamin (MULTIVITAMIN WITH MINERALS) TABS tablet Take 1 tablet by mouth daily. 08/05/22   Noralee Stain, DO  Pitavastatin Calcium 4 MG TABS TAKE 1 TABLET BY MOUTH EVERY DAY 02/17/23   Zola Button, Grayling Congress, DO  rivastigmine (EXELON) 1.5 MG capsule Take 1 capsule (1.5 mg total) by mouth 2 (two) times daily. Patient taking differently: Take 1.5 mg by mouth 2 (two) times daily. 0600 and 1200 05/15/22   Patel, Donika K, DO  sitaGLIPtin (JANUVIA) 100 MG tablet Take 1 tablet (100 mg total) by mouth daily. 04/07/23   Donato Schultz, DO      Allergies    Sulfonamide derivatives, Atorvastatin, and Rosuvastatin    Review of Systems   Review of Systems  Physical Exam Updated Vital Signs BP 100/72   Pulse 90   Temp 98 F (36.7 C) (Oral)   Resp 16   SpO2 100%  Physical Exam Vitals and nursing note reviewed.  Constitutional:      General: She is not in acute distress.    Appearance: She is well-developed.     Comments: Occasionally shouting.  Oriented to self only.  Will answer yes or no to questions occasionally.  HENT:  Head: Normocephalic and atraumatic.     Right Ear: External ear normal.     Left Ear: External ear normal.     Nose: Nose normal.  Eyes:     Extraocular Movements: Extraocular movements intact.     Conjunctiva/sclera: Conjunctivae normal.     Pupils: Pupils are equal, round, and reactive to light.  Cardiovascular:     Rate and Rhythm: Regular rhythm. Tachycardia present.     Heart sounds: No murmur heard. Pulmonary:     Effort: Pulmonary effort is normal. No respiratory distress.     Breath sounds: Normal breath sounds.  Abdominal:     General: Abdomen is flat. There is no distension.     Palpations: Abdomen is soft. There is no mass.     Tenderness: There is no  abdominal tenderness. There is no guarding.  Musculoskeletal:     Cervical back: Normal range of motion and neck supple.     Right lower leg: No edema.     Left lower leg: No edema.  Skin:    General: Skin is warm and dry.  Neurological:     Mental Status: She is alert. Mental status is at baseline.     Comments: Cranial nerves II through XII grossly intact.  Moving all 4 extremities.  Having difficulty complying with neurologic exam otherwise.  Psychiatric:        Mood and Affect: Mood normal.     ED Results / Procedures / Treatments   Labs (all labs ordered are listed, but only abnormal results are displayed) Labs Reviewed  BASIC METABOLIC PANEL - Abnormal; Notable for the following components:      Result Value   Sodium 156 (*)    Chloride 116 (*)    Glucose, Bld 460 (*)    BUN 36 (*)    Creatinine, Ser 1.47 (*)    GFR, Estimated 38 (*)    Anion gap 18 (*)    All other components within normal limits  CBC WITH DIFFERENTIAL/PLATELET - Abnormal; Notable for the following components:   RBC 5.19 (*)    HCT 46.4 (*)    Platelets 98 (*)    All other components within normal limits  BETA-HYDROXYBUTYRIC ACID - Abnormal; Notable for the following components:   Beta-Hydroxybutyric Acid 0.61 (*)    All other components within normal limits  OSMOLALITY - Abnormal; Notable for the following components:   Osmolality 369 (*)    All other components within normal limits  I-STAT VENOUS BLOOD GAS, ED - Abnormal; Notable for the following components:   pH, Ven 7.454 (*)    pCO2, Ven 36.2 (*)    pO2, Ven 26 (*)    Sodium 158 (*)    Calcium, Ion 1.13 (*)    All other components within normal limits  URINALYSIS, ROUTINE W REFLEX MICROSCOPIC  CBG MONITORING, ED    EKG None  Radiology No results found.  Procedures Procedures    Medications Ordered in ED Medications  0.45 % sodium chloride infusion (has no administration in time range)  lactated ringers bolus 1,000 mL (1,000  mLs Intravenous New Bag/Given 04/10/23 1206)  lactated ringers bolus 1,000 mL (1,000 mLs Intravenous New Bag/Given 04/10/23 1235)  insulin aspart (novoLOG) injection 5 Units (5 Units Subcutaneous Given 04/10/23 1235)    ED Course/ Medical Decision Making/ A&P Clinical Course as of 04/10/23 1315  Thu Apr 10, 2023  1222 Creatinine(!): 1.47 Baseline of 1 [RP]  1235 Dr Chipper Herb from hospitalist to  admit. [RP]    Clinical Course User Index [RP] Rondel Baton, MD                             Medical Decision Making Amount and/or Complexity of Data Reviewed Labs: ordered. Decision-making details documented in ED Course.  Risk Prescription drug management. Decision regarding hospitalization.   Victoria Gates is a 72 y.o. female with comorbidities that complicate the patient evaluation including non-verbal, IDDM, HTN, and HLD who presents to the emergency department with elevated blood sugars and generalized weakness  Initial Ddx:  DKA, HHS, dehydration, electrolyte abnormalities  MDM/Course:  Patient presents with elevated blood sugars.  Unclear exactly what is precipitating this change at this time.  On exam appears generally weak but does not have any focal neurologic deficits.  Mental status appears to be at her baseline.  Labs were obtained which did not show evidence of DKA but did show elevated blood sugar of 460 and an AKI with a creatinine of 1.47 with a baseline of 0.9.  Her sodium was also significantly elevated at 156.  Do suspect that she is significantly dehydrated likely due to diuresis from her elevated blood sugars at home.  She was given 2 additional liters of fluid and 5 units of insulin.  Upon re-evaluation patient remained stable.  Was discussed with hospitalist for admission for her elevated blood sugars, AKI, and hypernatremia.  This patient presents to the ED for concern of complaints listed in HPI, this involves an extensive number of treatment options, and is a  complaint that carries with it a high risk of complications and morbidity. Disposition including potential need for admission considered.   Dispo: Admit to Floor  Additional history obtained from spouse Records reviewed Outpatient Clinic Notes The following labs were independently interpreted: Chemistry and show AKI I personally reviewed and interpreted cardiac monitoring: normal sinus rhythm  I personally reviewed and interpreted the pt's EKG: see above for interpretation  I have reviewed the patients home medications and made adjustments as needed Consults: Hospitalist Social Determinants of health:  Elderly, dementia         Final Clinical Impression(s) / ED Diagnoses Final diagnoses:  Hyperglycemia  Acute kidney injury (HCC)  Dehydration  Hypernatremia    Rx / DC Orders ED Discharge Orders     None         Rondel Baton, MD 04/10/23 1315

## 2023-04-10 NOTE — Consult Note (Signed)
WOC Nurse Consult Note: Reason for Consult: Consult requested for right buttock wound. Husband at the bedside to assist with assessment, states he uses Desitin prior to admission.  Wound type: Right middle buttock with Stage 2 pressure injury; .3X.3X.2cm, red and moist Pressure Injury POA: Yes Dressing procedure/placement/frequency: Topical treatment orders provided for bedside nurses to perform as follows to protect from further injury: Foam dressing to right buttock, change Q 3 days or PRN soiling. Please re-consult if further assistance is needed.  Thank-you,  Cammie Mcgee MSN, RN, CWOCN, [place], CNS [phone]

## 2023-04-10 NOTE — Telephone Encounter (Signed)
Returned call and while leaving a voicemail I noticed pt was admitted to the hospital. Victoria Gates

## 2023-04-11 ENCOUNTER — Inpatient Hospital Stay (HOSPITAL_COMMUNITY): Payer: Medicare HMO

## 2023-04-11 DIAGNOSIS — Z515 Encounter for palliative care: Secondary | ICD-10-CM | POA: Diagnosis not present

## 2023-04-11 DIAGNOSIS — E7800 Pure hypercholesterolemia, unspecified: Secondary | ICD-10-CM | POA: Diagnosis present

## 2023-04-11 DIAGNOSIS — E860 Dehydration: Secondary | ICD-10-CM | POA: Diagnosis present

## 2023-04-11 DIAGNOSIS — G9341 Metabolic encephalopathy: Secondary | ICD-10-CM | POA: Diagnosis not present

## 2023-04-11 DIAGNOSIS — R1310 Dysphagia, unspecified: Secondary | ICD-10-CM | POA: Diagnosis present

## 2023-04-11 DIAGNOSIS — N179 Acute kidney failure, unspecified: Secondary | ICD-10-CM

## 2023-04-11 DIAGNOSIS — L89312 Pressure ulcer of right buttock, stage 2: Secondary | ICD-10-CM | POA: Diagnosis present

## 2023-04-11 DIAGNOSIS — R7989 Other specified abnormal findings of blood chemistry: Secondary | ICD-10-CM | POA: Diagnosis present

## 2023-04-11 DIAGNOSIS — Z794 Long term (current) use of insulin: Secondary | ICD-10-CM | POA: Diagnosis not present

## 2023-04-11 DIAGNOSIS — F03C0 Unspecified dementia, severe, without behavioral disturbance, psychotic disturbance, mood disturbance, and anxiety: Secondary | ICD-10-CM

## 2023-04-11 DIAGNOSIS — R531 Weakness: Secondary | ICD-10-CM | POA: Diagnosis not present

## 2023-04-11 DIAGNOSIS — D696 Thrombocytopenia, unspecified: Secondary | ICD-10-CM | POA: Diagnosis present

## 2023-04-11 DIAGNOSIS — E8809 Other disorders of plasma-protein metabolism, not elsewhere classified: Secondary | ICD-10-CM | POA: Diagnosis present

## 2023-04-11 DIAGNOSIS — R627 Adult failure to thrive: Secondary | ICD-10-CM | POA: Diagnosis present

## 2023-04-11 DIAGNOSIS — Z882 Allergy status to sulfonamides status: Secondary | ICD-10-CM | POA: Diagnosis not present

## 2023-04-11 DIAGNOSIS — E1165 Type 2 diabetes mellitus with hyperglycemia: Secondary | ICD-10-CM | POA: Diagnosis present

## 2023-04-11 DIAGNOSIS — Z888 Allergy status to other drugs, medicaments and biological substances status: Secondary | ICD-10-CM | POA: Diagnosis not present

## 2023-04-11 DIAGNOSIS — C9001 Multiple myeloma in remission: Secondary | ICD-10-CM | POA: Diagnosis present

## 2023-04-11 DIAGNOSIS — R739 Hyperglycemia, unspecified: Secondary | ICD-10-CM | POA: Diagnosis not present

## 2023-04-11 DIAGNOSIS — E876 Hypokalemia: Secondary | ICD-10-CM | POA: Diagnosis present

## 2023-04-11 DIAGNOSIS — D649 Anemia, unspecified: Secondary | ICD-10-CM | POA: Diagnosis present

## 2023-04-11 DIAGNOSIS — E870 Hyperosmolality and hypernatremia: Secondary | ICD-10-CM | POA: Diagnosis present

## 2023-04-11 DIAGNOSIS — I10 Essential (primary) hypertension: Secondary | ICD-10-CM | POA: Diagnosis present

## 2023-04-11 DIAGNOSIS — Z9484 Stem cells transplant status: Secondary | ICD-10-CM | POA: Diagnosis not present

## 2023-04-11 DIAGNOSIS — E874 Mixed disorder of acid-base balance: Secondary | ICD-10-CM | POA: Diagnosis present

## 2023-04-11 LAB — CBC WITH DIFFERENTIAL/PLATELET
Abs Immature Granulocytes: 0.02 10*3/uL (ref 0.00–0.07)
Basophils Absolute: 0 10*3/uL (ref 0.0–0.1)
Basophils Relative: 0 %
Eosinophils Absolute: 0.1 10*3/uL (ref 0.0–0.5)
Eosinophils Relative: 1 %
HCT: 40.6 % (ref 36.0–46.0)
Hemoglobin: 12.5 g/dL (ref 12.0–15.0)
Immature Granulocytes: 0 %
Lymphocytes Relative: 34 %
Lymphs Abs: 2.8 10*3/uL (ref 0.7–4.0)
MCH: 28 pg (ref 26.0–34.0)
MCHC: 30.8 g/dL (ref 30.0–36.0)
MCV: 91 fL (ref 80.0–100.0)
Monocytes Absolute: 0.6 10*3/uL (ref 0.1–1.0)
Monocytes Relative: 7 %
Neutro Abs: 4.7 10*3/uL (ref 1.7–7.7)
Neutrophils Relative %: 58 %
Platelets: 80 10*3/uL — ABNORMAL LOW (ref 150–400)
RBC: 4.46 MIL/uL (ref 3.87–5.11)
RDW: 14.4 % (ref 11.5–15.5)
WBC: 8.3 10*3/uL (ref 4.0–10.5)
nRBC: 0 % (ref 0.0–0.2)

## 2023-04-11 LAB — COMPREHENSIVE METABOLIC PANEL
ALT: 342 U/L — ABNORMAL HIGH (ref 0–44)
AST: 198 U/L — ABNORMAL HIGH (ref 15–41)
Albumin: 2.6 g/dL — ABNORMAL LOW (ref 3.5–5.0)
Alkaline Phosphatase: 111 U/L (ref 38–126)
Anion gap: 8 (ref 5–15)
BUN: 19 mg/dL (ref 8–23)
CO2: 23 mmol/L (ref 22–32)
Calcium: 8.5 mg/dL — ABNORMAL LOW (ref 8.9–10.3)
Chloride: 122 mmol/L — ABNORMAL HIGH (ref 98–111)
Creatinine, Ser: 1.07 mg/dL — ABNORMAL HIGH (ref 0.44–1.00)
GFR, Estimated: 55 mL/min — ABNORMAL LOW (ref >60)
Glucose, Bld: 167 mg/dL — ABNORMAL HIGH (ref 70–99)
Potassium: 3.4 mmol/L — ABNORMAL LOW (ref 3.5–5.1)
Sodium: 153 mmol/L — ABNORMAL HIGH (ref 135–145)
Total Bilirubin: 1 mg/dL (ref 0.3–1.2)
Total Protein: 6.8 g/dL (ref 6.5–8.1)

## 2023-04-11 LAB — SODIUM: Sodium: 153 mmol/L — ABNORMAL HIGH (ref 135–145)

## 2023-04-11 LAB — GLUCOSE, CAPILLARY
Glucose-Capillary: 179 mg/dL — ABNORMAL HIGH (ref 70–99)
Glucose-Capillary: 129 mg/dL — ABNORMAL HIGH (ref 70–99)
Glucose-Capillary: 64 mg/dL — ABNORMAL LOW (ref 70–99)
Glucose-Capillary: 124 mg/dL — ABNORMAL HIGH (ref 70–99)
Glucose-Capillary: 250 mg/dL — ABNORMAL HIGH (ref 70–99)

## 2023-04-11 LAB — PHOSPHORUS: Phosphorus: 2.9 mg/dL (ref 2.5–4.6)

## 2023-04-11 LAB — MAGNESIUM: Magnesium: 1.9 mg/dL (ref 1.7–2.4)

## 2023-04-11 MED ORDER — DEXTROSE 50 % IV SOLN: 12.5000 g | INTRAVENOUS | Status: AC

## 2023-04-11 MED ORDER — POTASSIUM CHLORIDE 10 MEQ/100ML IV SOLN
10.0000 meq | INTRAVENOUS | Status: AC
  Administered 2023-04-11 – 2023-04-12 (×4): 10 meq via INTRAVENOUS
  Filled 2023-04-11 (×4): qty 100

## 2023-04-11 MED ORDER — INSULIN ASPART 100 UNIT/ML IJ SOLN
5.0000 [IU] | Freq: Three times a day (TID) | INTRAMUSCULAR | Status: DC
  Administered 2023-04-11 – 2023-04-13 (×6): 5 [IU] via SUBCUTANEOUS

## 2023-04-11 NOTE — Progress Notes (Signed)
Pt's spouse Caylei Hawkin at bedside.  This RN updated pt's son , Sanne Brazelton , via phone conversation per Spouse request.

## 2023-04-11 NOTE — Progress Notes (Signed)
Speech Language Pathology Treatment: Dysphagia  Patient Details Name: Victoria Gates MRN: [id number] DOB: 1951/09/07 Today's Date: 04/11/2023 Time: 7253-6644 SLP Time Calculation (min) (ACUTE ONLY): 12 min  Assessment / Plan / Recommendation Clinical Impression  Therapist requested to re-assess pt now that husband is here who stated she typically doesn't eat for strangers. Husband fed applesauce and straw sips thin liquid. She initially held in her mouth, looking at this therapist and crying. When SLP out of sight she was able to attend to husband better and transit po's to initiate a swallow. There was no coughing or signs of aspiration. SLP will order puree texture, thin liquids, crush meds. Intake will be questionable if she refuses to swallow for nursing staff. Hopefully husband may be able to be here as much as possible. The husband states she has been holding food with him since Wed intermittently. Pt appears to be at baseline swallow function- she eats puree texture at home. ST will sign off    HPI HPI: Victoria Gates is a 72 y.o. female with medical history significant of IIDM who was recently started on insulin, morbid obesity, advanced dementia, multiple myeloma in remission, brought in by family members for poorly controlled diabetes.  patient can only eat pured diet. Per chart Found to have AKI, severe dehydration, starvation ketoacidosis. Since November last year, patient has had a dental procedure and a temporary implant placed in, after which patient can only eat pured diet, family reported acute appetite and patient gained about 20 pounds compared to November 2023. BSE 07/2022 recommended puoree/thin.      SLP Plan  ST will sign off at this time.       Recommendations for follow up therapy are one component of a multi-disciplinary discharge planning process, led by the attending physician.  Recommendations may be updated based on patient status, additional functional criteria and  insurance authorization.    Recommendations  Diet recommendations: Dysphagia 1 (puree);Thin liquid Liquids provided via: Straw Medication Administration: Crushed with puree Supervision: Full supervision/cueing for compensatory strategies Compensations: Slow rate;Small sips/bites;Minimize environmental distractions Postural Changes and/or Swallow Maneuvers: Seated upright 90 degrees                  Oral care BID   Frequent or constant Supervision/Assistance Dysphagia, unspecified (R13.10)     Continue with current plan of care     Royce Macadamia  04/11/2023, 3:20 PM

## 2023-04-11 NOTE — Inpatient Diabetes Management (Signed)
Inpatient Diabetes Program Recommendations  AACE/ADA: New Consensus Statement on Inpatient Glycemic Control (2015)  Target Ranges:  Prepandial:   less than 140 mg/dL      Peak postprandial:   less than 180 mg/dL (1-2 hours)      Critically ill patients:  140 - 180 mg/dL   Lab Results  Component Value Date   GLUCAP 64 (L) 04/11/2023   HGBA1C 6.8 (H) 02/03/2023    Review of Glycemic Control  Latest Reference Range & Units 04/10/23 10:54 04/10/23 16:03 04/10/23 22:01 04/11/23 07:46 04/11/23 11:40 04/11/23 16:01  Glucose-Capillary 70 - 99 mg/dL 782 (H) 956 (H) 213 (H) 179 (H) 129 (H) 64 (L)   Diabetes history: DM 2 Outpatient Diabetes medications: Tresiba 10 units, Januvia 100 mg Daily, Humalog 2-10 units  Current orders for Inpatient glycemic control:  Tradjenta 5 mg Daily Novolog 0-15 units tid + hs Semglee 15 units Daily  Note: pt hypoglycemia this evening.  Glucerna tid between meals  Inpatient Diabetes Program Recommendations:    -  Reduce Novolog meal coverage to 5 units tid -  hold dinner time insulin with meal coverage and correction scale -  recheck glucose to make sure it has corrected -  next check at hs and cover per order  Spoke with husband and daughter at bedside. Discussed glucose trends at home. Pt drinks 3 Ensures a day (42 grams of carbohydrate) at home. I reviewed glucose trends over the last 2 months in husbands phone. Trends have steadily increased and has reached into the 500 level. Pt has been on [place] for 2 weeks. I discussed the current regimen pt is on in the hospital, I told them we would monitor what needs the pt may have for insulin. I did generally discuss  -   the possible need for an increase in basal insulin -   sliding scale insulin -   the addition of meal coverage insulin to prevent postprandial spikes. I will create a schedule for husband to follow. Daughter states she lives in [place].    Thanks,  Christena Deem RN, MSN, BC-ADM Inpatient  Diabetes Coordinator Team Pager [phone] (8a-5p)

## 2023-04-11 NOTE — Evaluation (Signed)
Clinical/Bedside Swallow Evaluation Patient Details  Name: JANITZA OVERBY MRN: [id number] Date of Birth: 1950-12-08  Today's Date: 04/11/2023 Time: SLP Start Time (ACUTE ONLY): 0915 SLP Stop Time (ACUTE ONLY): 0926 SLP Time Calculation (min) (ACUTE ONLY): 11 min  Past Medical History:  Past Medical History:  Diagnosis Date   Allergic rhinitis 01/02/2009   Bradycardia 04/29/2016   Cervical Radiculopathy 02/01/2010   Left side   Chest pain 04/15/2012   Ex MV: Ex time 10 mins, EF 63%, no ischemia/infarct. Occas PAC's/PVC's.   Dementia (HCC)    Diabetes mellitus, type II 03/18/2008   Encounter for antineoplastic chemotherapy 04/29/2016   Esophageal Stricture 04/15/2007   s/p dilitation   GERD (gastroesophageal reflux disease) 09/21/2010   Herpes zoster 11/05/2010   Hiatal Hernia 04/15/2007   History of colonic polyps 04/24/2007   Hyperplastic only   Hypercholesterolemia 07/03/2009   Hypokalemia 11/30/2015   Iron deficiency anemia 04/24/2007   Multiple myeloma (HCC) 02/2014   Osteoporosis 04/24/2007   Past Surgical History:  Past Surgical History:  Procedure Laterality Date   ESOPHAGOGASTRODUODENOSCOPY  04/15/2007   TUBAL LIGATION     HPI:  TEE NEVE is a 72 y.o. female with medical history significant of IIDM who was recently started on insulin, morbid obesity, advanced dementia, multiple myeloma in remission, brought in by family members for poorly controlled diabetes.  patient can only eat pured diet. Per chart Found to have AKI, severe dehydration, starvation ketoacidosis. Since November last year, patient has had a dental procedure and a temporary implant placed in, after which patient can only eat pured diet, family reported acute appetite and patient gained about 20 pounds compared to November 2023. BSE 07/2022 recommended puoree/thin.    Assessment / Plan / Recommendation  Clinical Impression  Pt with advanced dementia seen for swallow assessment and was unable to  transit any po despite attempts. She was alert, not able to follow commands and majority of dentition is missing and per chart is on puree diet at baseline. Decreased lip seal to spoon with applesauce which remained on anterior tongue without pt attempting to manipulate. SLP used dry spoon, lingual pressure, attempted liquid wash. Pt sat with mouth opened and moaning. Eventually bolus was suctioned from oral cavity. Pt unable to take po's which may wax and wane given her dementia.Palliative care may be beneficial to establish goals of care. SLP will reattempt and continue efforts. Cortrak considered if MD agreess (pt may pull it out). Unable to make recommendations re: po at this time. SLP Visit Diagnosis: Dysphagia, unspecified (R13.10)    Aspiration Risk  Moderate aspiration risk    Diet Recommendation NPO    Medication Administration:  (Cortrak per MD)    Other  Recommendations Oral Care Recommendations: Oral care BID    Recommendations for follow up therapy are one component of a multi-disciplinary discharge planning process, led by the attending physician.  Recommendations may be updated based on patient status, additional functional criteria and insurance authorization.  Follow up Recommendations Skilled nursing-short term rehab (<3 hours/day)      Assistance Recommended at Discharge    Functional Status Assessment Patient has had a recent decline in their functional status and demonstrates the ability to make significant improvements in function in a reasonable and predictable amount of time.  Frequency and Duration min 2x/week  2 weeks       Prognosis Prognosis for improved oropharyngeal function: Guarded Barriers to Reach Goals: Cognitive deficits      Swallow Study  General HPI: HALANA HAEFFNER is a 72 y.o. female with medical history significant of IIDM who was recently started on insulin, morbid obesity, advanced dementia, multiple myeloma in remission, brought in by family  members for poorly controlled diabetes.  patient can only eat pured diet. Per chart Found to have AKI, severe dehydration, starvation ketoacidosis. Since November last year, patient has had a dental procedure and a temporary implant placed in, after which patient can only eat pured diet, family reported acute appetite and patient gained about 20 pounds compared to November 2023. BSE 07/2022 recommended puoree/thin. Type of Study: Bedside Swallow Evaluation Previous Swallow Assessment:  (see HPI) Diet Prior to this Study: NPO Temperature Spikes Noted: No Respiratory Status: Room air History of Recent Intubation: No Behavior/Cognition: Alert;Confused;Uncooperative;Requires cueing;Doesn't follow directions Oral Cavity Assessment: Within Functional Limits Oral Care Completed by SLP: Recent completion by staff Oral Cavity - Dentition: Missing dentition Vision: Functional for self-feeding Self-Feeding Abilities: Total assist Patient Positioning: Upright in bed Baseline Vocal Quality: Normal Volitional Cough: Cognitively unable to elicit Volitional Swallow: Unable to elicit    Oral/Motor/Sensory Function Overall Oral Motor/Sensory Function:  (pt unable to follow)   Ice Chips Ice chips: Not tested   Thin Liquid Thin Liquid:  (attempted- pt unable)    Nectar Thick Nectar Thick Liquid: Not tested   Honey Thick Honey Thick Liquid: Not tested   Puree Puree: Impaired Oral Phase Impairments: Poor awareness of bolus Oral Phase Functional Implications: Oral holding Pharyngeal Phase Impairments:  (no swallow)   Solid     Solid: Not tested      Royce Macadamia 04/11/2023,9:45 AM

## 2023-04-11 NOTE — Hospital Course (Addendum)
HPI per Dr. Mikey College on 04/10/23 HPI: Victoria Gates is a 72 y.o. female with medical history significant of IIDM who was recently started on insulin, morbid obesity, advanced dementia, multiple myeloma in remission, brought in by family members for poorly controlled diabetes.   Patient has a longstanding insulin independent diabetes, managed with only Januvia p.o. 50 mg daily.  Starting in May this year, family started noticed patient glucose become poorly controlled with most readings above 300 at home.  She was hospitalized 2 weeks ago for uncontrolled diabetes and was discharged on Trebisa 10 units daily and after 1 week family reported continuous increased glucose of more than 300 readings at home.  2 days ago she was started on sliding scale, and family reported slightly improved glucose control however patient has had poor p.o. intake since starting on insulin 2 weeks ago and became dehydrated.  Last week, family also noticed patient has had increased p.o. fluid intake and increased urine output however this week has been very opposite end with decreased fluid intake and decreased urine output.  Since November last year, patient has had a dental procedure and a temporary implant placed in, after which patient can only eat pured diet.  Family reported acute appetite and patient gained about 20 pounds compared to November 2023.  Currently, patient has no major complaints, denies any pain nauseous vomiting diarrhea.  **Interim History  Continues to remain confused and had difficulty swallowing so she was made n.p.o. and SLP evaluated.  SLP reevaluated later on and she is placed on dysphagia 1 thin liquid diet. Na+ is Improved but LFTs elevated so will obtain U/S. Palliative Consulted for GOC but husband declined.  LFTs continue to improve and she was stable and improved she will need to follow-up with PCP within 1 week as well as neurology outpatient setting.  Assessment and Plan:  Severe Dehydration  proved Acute Hypernatremia, improved; See below -Corrected sodium level 162, calculated free water deficit 5.9 L.   -Plan to correct in 2 days, with a rate of 10-12 mmol/day, and was changed to IV fluid to half-normal saline at 150 MLS per hour but reduced the rate to 75 mL/hr and will continue for only 12 more hours, will encourage increased p.o. fluid intake -Recheck Na level every 6 hours today and daily BMP -Na+ Trend: Recent Labs  Lab 03/28/23 2334 03/29/23 0039 04/10/23 1053 04/10/23 1128 04/11/23 0829 04/12/23 0251 04/13/23 0142  NA 138 143 156* 158* 153*  153* 144 142  -Continue to Monitor and Trend -Repeat CMP within 1 week  Starvation Ketoacidosis -Combined Decompensated high anion gap metabolic acidosis and acute respiratory alkalosis -Correct volume status and fluid level then reevaluate -IVF as below and she is much improved   IDDM with Hyperglycemia -Clinically suspect loss of insulin sensitivity due to significant weight gain since November last year. -Increase long-acting insulin to 15 units daily, and start NovoLog 5 unit 3 times daily AC, plus a sliding scale -CBG and Glucose Trend: Recent Labs  Lab 03/24/23 1054 03/24/23 1101 03/28/23 2334 03/29/23 0155 04/10/23 1053 04/10/23 1054 04/11/23 0829 04/11/23 1140 04/12/23 0251 04/12/23 0730 04/13/23 0142 04/13/23 0741 04/13/23 1136  GLUCOSE 431*  --  530*  --  460*  --  167*  --  191*  --  148*  --   --   GLUCAP  --    < >  --    < >  --    < >  --    < >  --    < >  --    < >  286*   < > = values in this interval not displayed.  -Hold Januvia -Consider outpatient Ozempic at discharge  Hypokalemia -Patient's K+ Level Trend: Recent Labs  Lab 03/28/23 2334 03/29/23 0039 04/10/23 1053 04/10/23 1128 04/11/23 0829 04/12/23 0251 04/13/23 0142  K 3.9 4.4 4.4 4.2 3.4* 3.8 3.4*  -Continue to Monitor and Replete as Necessary -Repeat CMP in 1 week  Hypophosphatemia -Phos Level Trend: Recent Labs   Lab 04/11/23 0829 04/12/23 0251 04/13/23 0142  PHOS 2.9 2.1* 2.9  -Replete with IV K Phos 20 mmol -Continue to Monitor and Trend and Repeat CMP in the AM   Abnormal LFTs, improving -? In the setting of Severe Dehydration -LFT Trend: Recent Labs  Lab 03/24/23 1054 03/28/23 2334 04/11/23 0829 04/12/23 0251 04/13/23 0142  AST 27 32 198* 203* 110*  ALT 44 40 342* 289* 223*  -Check RUQ U/S and Acute Hepatitis Panel -U/S done and showed "Very limited examination. Liver appears to be heterogeneous. No definite biliary dilatation. Probable small decompressed gallbladder but limited evaluation."  -Acute Hepatitis Panel Negative  -Repeat CMP within 1 week    Hx of MM -In remission, last follow-up with oncology was in May 2024 and was told condition was stable.   Advanced Dementia -Mentation at baseline per husband -Palliative care consulted for further goals of care discussion and plan is to try and have a meeting patient remains a full code and husband has declined an offer for family meeting and that the family wants all offered and available medical interventions to prolong the patient's life  Normocytic Anemia -Likely Dilutional Drop from IVF -Hgb/Hct Trend: Recent Labs  Lab 03/28/23 2334 03/29/23 0039 04/10/23 1053 04/10/23 1128 04/11/23 0829 04/12/23 0251 04/13/23 0142  HGB 13.8 14.3 14.1 14.6 12.5 11.7* 12.5  HCT 43.0 42.0 46.4* 43.0 40.6 37.7 38.2  MCV 84.8  --  89.4  --  91.0 86.7 87.2  -Check Anemia Panel in the outpatient setting  -Continue to Monitor for S/Sx of Bleeding; no overt bleeding noted -Repeat CBC within 1 week   Stage II Right Gluteal Pressure Ulcer Pressure Injury 04/10/23 Buttocks Right Stage 2 -  Partial thickness loss of dermis presenting as a shallow open injury with a red, pink wound bed without slough. (Active)  04/10/23   Location: Buttocks  Location Orientation: Right  Staging: Stage 2 -  Partial thickness loss of dermis presenting as a  shallow open injury with a red, pink wound bed without slough.  Wound Description (Comments):   Present on Admission: Yes  -No signs of infection -Consult wound care   Chronic Dysphagia -Previously on Pureed diet -SLP to Evaluate and Treat and will continue Dysphagia 1 Diet   Thrombocytopenia, slightly worsening  -Platelet Count Trend: Recent Labs  Lab 03/24/23 1054 03/28/23 2334 04/10/23 1053 04/11/23 0829 04/12/23 0251 04/13/23 0142  PLT 149* 187 98* 80* 78* 78*  -Continue to Monitor for S/Sx of Bleeding; no overt bleeding noted -Repeat CBC within 1 week  AKI, improved -BUN/Cr Trend: Recent Labs  Lab 03/24/23 1054 03/28/23 2334 04/10/23 1053 04/11/23 0829 04/12/23 0251 04/13/23 0142  BUN 22 30* 36* 19 20 15   CREATININE 1.03* 1.20* 1.47* 1.07* 0.86 0.88  -C/w 1/2 NS but reduce rate from 150 mL/hr to 75 mL/hr and now will discontinue IV fluids given her improvement -Avoid Nephrotoxic Medications, Contrast Dyes, Hypotension and Dehydration to Ensure Adequate Renal Perfusion and will need to Renally Adjust Meds -Continue to Monitor and Trend Renal Function carefully  and repeat CMP in 1 week  Hypoalbuminemia -Patient's Albumin Trend: Recent Labs  Lab 03/24/23 1054 03/28/23 2334 04/11/23 0829 04/12/23 0251 04/13/23 0142  ALBUMIN 3.4* 3.9 2.6* 2.4* 2.4*  -Continue to Monitor and Trend and repeat CMP in the AM

## 2023-04-11 NOTE — Progress Notes (Signed)
Pt refused all night time meds. When I was about to give pt her meds crused in applesauce, pt had a folded band-aid in her mouth, which I retrieved. I gave pt half a table spoon of applesauce and pt did not swallow it, most was retrieved. Dr. Arlean Hopping notified per above and was asked to make pt NPO and have swallow eval ordered. Dr. placed orders for above.

## 2023-04-11 NOTE — Progress Notes (Signed)
Spouse notified of updated diet order at bedside.  Menu given .  Lunch ordered.

## 2023-04-11 NOTE — Inpatient Diabetes Management (Signed)
Inpatient Diabetes Program Recommendations  AACE/ADA: New Consensus Statement on Inpatient Glycemic Control (2015)  Target Ranges:  Prepandial:   less than 140 mg/dL      Peak postprandial:   less than 180 mg/dL (1-2 hours)      Critically ill patients:  140 - 180 mg/dL   Lab Results  Component Value Date   GLUCAP 179 (H) 04/11/2023   HGBA1C 6.8 (H) 02/03/2023    Review of Glycemic Control  Latest Reference Range & Units 04/10/23 10:54 04/10/23 16:03 04/10/23 22:01 04/11/23 07:46  Glucose-Capillary 70 - 99 mg/dL 161 (H) 096 (H) 045 (H) 179 (H)   Diabetes history: DM 2 Outpatient Diabetes medications: Tresiba 10 units Daily, Humalog 0-10 units tid, Januvia 100 mg Daily Current orders for Inpatient glycemic control:  Tradjenta 5 mg Daily Novolog 0-15 units tid + hs Novolog 8 units Semglee 15 units Daily  Watch on current insulin regimen, new basal insulin dose to be given today, in addition to the Novolog meal coverage.  Pt was seen on 7/8 by Henrene Pastor, Pharmacist, to manage her DM. Plan was f/u in 1 week with glucose trends for titration of medications.  Extensive diet education done at that visit. Will follow up with Family with instruction on insulin for discharge. Pt may benefit from meal coverage being added to regimen in addition to increase in basal insulin. Will watch glucose trends, MD to adjust insulin.  Thanks,  Christena Deem RN, MSN, BC-ADM Inpatient Diabetes Coordinator Team Pager [phone] (8a-5p)

## 2023-04-11 NOTE — Progress Notes (Signed)
PROGRESS NOTE    Victoria Gates  BJY:[id number] DOB: Jan 25, 1951 DOA: 04/10/2023 PCP: Donato Schultz, DO   Brief Narrative:  HPI per Dr. Mikey College on 04/10/23 HPI: Victoria Gates is a 72 y.o. female with medical history significant of IIDM who was recently started on insulin, morbid obesity, advanced dementia, multiple myeloma in remission, brought in by family members for poorly controlled diabetes.   Patient has a longstanding insulin independent diabetes, managed with only Januvia p.o. 50 mg daily.  Starting in May this year, family started noticed patient glucose become poorly controlled with most readings above 300 at home.  She was hospitalized 2 weeks ago for uncontrolled diabetes and was discharged on Trebisa 10 units daily and after 1 week family reported continuous increased glucose of more than 300 readings at home.  2 days ago she was started on sliding scale, and family reported slightly improved glucose control however patient has had poor p.o. intake since starting on insulin 2 weeks ago and became dehydrated.  Last week, family also noticed patient has had increased p.o. fluid intake and increased urine output however this week has been very opposite end with decreased fluid intake and decreased urine output.  Since November last year, patient has had a dental procedure and a temporary implant placed in, after which patient can only eat pured diet.  Family reported acute appetite and patient gained about 20 pounds compared to November 2023.  Currently, patient has no major complaints, denies any pain nauseous vomiting diarrhea.  **Interim History  Continues to remain confused and had difficulty swallowing so she was made n.p.o. and SLP evaluated.  SLP reevaluated later on and she is placed on dysphagia 1 thin liquid diet  Assessment and Plan:  Severe Dehydration Acute hypernatremia -Corrected sodium level 162, calculated free water deficit 5.9 L.   -Plan to correct in 2  days, with a rate of 10-12 mmol/day, and was changed to IV fluid to half-normal saline at 150 MLS per hour but will reduce the rate to 75 mL/hr, will encourage increased p.o. fluid intake -Recheck Na level every 6 hours today and daily BMP -Na+ Trend: Recent Labs  Lab 03/24/23 1054 03/28/23 2334 03/29/23 0039 04/10/23 1053 04/10/23 1128 04/11/23 0829  NA 131* 138 143 156* 158* 153*  153*    Starvation ketoacidosis -Combined decompensated high anion gap metabolic acidosis and acute respiratory alkalosis -Correct volume status and fluid level then reevaluate -IVF as below    IDDM with hyperglycemia -Clinically suspect loss of insulin sensitivity due to significant weight gain since November last year. -Increase long-acting insulin to 15 units daily, and start NovoLog 5 unit 3 times daily AC, plus a sliding scale -CBG and Glucose Trend: Recent Labs  Lab 03/24/23 1054 03/24/23 1101 03/28/23 2334 03/29/23 0155 04/10/23 1053 04/10/23 1054 04/11/23 0829 04/11/23 1140 04/11/23 1709  GLUCOSE 431*  --  530*  --  460*  --  167*  --   --   GLUCAP  --    < >  --    < >  --    < >  --    < > 124*   < > = values in this interval not displayed.  -Hold Januvia -Consider outpatient Ozempic  Hypokalemia -Patient's K+ Level Trend: Recent Labs  Lab 03/24/23 1054 03/28/23 2334 03/29/23 0039 04/10/23 1053 04/10/23 1128 04/11/23 0829  K 3.8 3.9 4.4 4.4 4.2 3.4*  -Replete with IV Kcl 40 mEQ -Continue to Monitor  and Replete as Necessary -Repeat CMP in the AM   Abnormal LFTs -? In the setting of Sever Dehydration -LFT Trend: Recent Labs  Lab 03/24/23 1054 03/28/23 2334 04/11/23 0829  AST 27 32 198*  ALT 44 40 342*  -Check RUQ U/S and Acute Hepatitis Panel -Repeat CBC in the AM     Hx of MM -In remission, last follow-up with oncology was in May 2024 and was told condition was stable.   Advanced Dementia -Mentation at baseline -Palliative care consulted for further goals  of care discussion and plan is to try and have a meeting tomorrow   Stage II Right Gluteal Pressure Ulcer Pressure Injury 04/10/23 Buttocks Right Stage 2 -  Partial thickness loss of dermis presenting as a shallow open injury with a red, pink wound bed without slough. (Active)  04/10/23   Location: Buttocks  Location Orientation: Right  Staging: Stage 2 -  Partial thickness loss of dermis presenting as a shallow open injury with a red, pink wound bed without slough.  Wound Description (Comments):   Present on Admission: Yes  -No signs of infection -Consult wound care   Chronic Dysphagia -Previously on Pureed diet -SLP to Evaluate and Treat  Thrombocytopenia -Platelet Count Trend: Recent Labs  Lab 03/24/23 1054 03/28/23 2334 04/10/23 1053 04/11/23 0829  PLT 149* 187 98* 80*  -Continue to Monitor for S/Sx of Bleeding; no overt bleeding noted -Repeat CBC in the AM  Hypernatremia -Na+ Trend: Recent Labs  Lab 03/24/23 1054 03/28/23 2334 03/29/23 0039 04/10/23 1053 04/10/23 1128  NA 131* 138 143 156* 158*  -In the setting of Dehydration -Continue to Monitor and Trend -Repeat CMP in the AM  AKI -BUN/Cr Trend: Recent Labs  Lab 03/24/23 1054 03/28/23 2334 04/10/23 1053  BUN 22 30* 36*  CREATININE 1.03* 1.20* 1.47*  -C/w 1/2 NS but reduce rate from 150 mL/hr to 75 mL/hr -Avoid Nephrotoxic Medications, Contrast Dyes, Hypotension and Dehydration to Ensure Adequate Renal Perfusion and will need to Renally Adjust Meds -Continue to Monitor and Trend Renal Function carefully and repeat CMP in the AM   Thrombocytopenia -Platelet Count Trend: Recent Labs  Lab 03/24/23 1054 03/28/23 2334 04/10/23 1053  PLT 149* 187 98*  -Continue to Monitor for S/Sx of Bleeding; No overt bleeding noted -Repeat CBC in the AM  Hypoalbuminemia -Patient's Albumin Trend: Recent Labs  Lab 03/24/23 1054 03/28/23 2334 04/11/23 0829  ALBUMIN 3.4* 3.9 2.6*  -Continue to Monitor and Trend  and repeat CMP in the AM  DVT prophylaxis: enoxaparin (LOVENOX) injection 40 mg Start: 04/10/23 1345    Code Status: Full Code Family Communication: Discussed with patient's husband at bedside and with the daughter over the telephone  Disposition Plan:  Level of care: Med-Surg Status is: Observation The patient will require care spanning > 2 midnights and should be moved to inpatient because: Continues to get IV fluid hydration and continues to have elevated sodiums   Consultants:  Palliative care medicine  Procedures:  As delineated as above  Antimicrobials:  Anti-infectives (From admission, onward)    None       Subjective: Seen and examined at bedside and the patient remains significantly confused.  Per nursing she was pocketing her food and not really swallowing.  No real subjective history can be obtained from the patient and she knows her name but does not know who her husband is or where she is.  Objective: Vitals:   04/10/23 2340 04/11/23 0612 04/11/23 0726 04/11/23 1935  BP: 103/72 118/75 120/74 (!) 86/59  Pulse: 75 75 76 98  Resp: 16 16 16 15   Temp: 98.7 F (37.1 C) 98.6 F (37 C) 98.7 F (37.1 C) 98.4 F (36.9 C)  TempSrc: Axillary Axillary Oral Oral  SpO2: 100% 100% 98% 91%    Intake/Output Summary (Last 24 hours) at 04/11/2023 2107 Last data filed at 04/11/2023 0981 Gross per 24 hour  Intake 1000 ml  Output --  Net 1000 ml   There were no vitals filed for this visit.  Examination: Physical Exam:  Constitutional: Chronically ill-appearing African-American female in no acute distress Respiratory: Diminished to auscultation bilaterally, no wheezing, rales, rhonchi or crackles. Normal respiratory effort and patient is not tachypenic. No accessory muscle use.  Unlabored breathing Cardiovascular: RRR, no murmurs / rubs / gallops. S1 and S2 auscultated. No extremity edema.  Abdomen: Soft, non-tender, distended secondary to body habitus.  Bowel sounds  positive.  GU: Deferred. Musculoskeletal: No clubbing / cyanosis of digits/nails. No joint deformity upper and lower extremities.  Skin: No rashes, lesions, ulcers limited skin evaluation. No induration; Warm and dry.  Neurologic: CN 2-12 grossly intact with no focal deficits. Romberg sign and cerebellar reflexes not assessed.  Psychiatric: Impaired judgment and insight and only alert and oriented to herself  Data Reviewed: I have personally reviewed following labs and imaging studies  CBC: Recent Labs  Lab 04/10/23 1053 04/10/23 1128 04/11/23 0829  WBC 8.8  --  8.3  NEUTROABS 6.4  --  4.7  HGB 14.1 14.6 12.5  HCT 46.4* 43.0 40.6  MCV 89.4  --  91.0  PLT 98*  --  80*   Basic Metabolic Panel: Recent Labs  Lab 04/10/23 1053 04/10/23 1128 04/11/23 0829  NA 156* 158* 153*  153*  K 4.4 4.2 3.4*  CL 116*  --  122*  CO2 22  --  23  GLUCOSE 460*  --  167*  BUN 36*  --  19  CREATININE 1.47*  --  1.07*  CALCIUM 9.5  --  8.5*  MG  --   --  1.9  PHOS  --   --  2.9   GFR: Estimated Creatinine Clearance: 53.6 mL/min (A) (by C-G formula based on SCr of 1.07 mg/dL (H)). Liver Function Tests: Recent Labs  Lab 04/11/23 0829  AST 198*  ALT 342*  ALKPHOS 111  BILITOT 1.0  PROT 6.8  ALBUMIN 2.6*   No results for input(s): "LIPASE", "AMYLASE" in the last 168 hours. No results for input(s): "AMMONIA" in the last 168 hours. Coagulation Profile: No results for input(s): "INR", "PROTIME" in the last 168 hours. Cardiac Enzymes: No results for input(s): "CKTOTAL", "CKMB", "CKMBINDEX", "TROPONINI" in the last 168 hours. BNP (last 3 results) No results for input(s): "PROBNP" in the last 8760 hours. HbA1C: No results for input(s): "HGBA1C" in the last 72 hours. CBG: Recent Labs  Lab 04/10/23 2201 04/11/23 0746 04/11/23 1140 04/11/23 1601 04/11/23 1709  GLUCAP 252* 179* 129* 64* 124*   Lipid Profile: No results for input(s): "CHOL", "HDL", "LDLCALC", "TRIG", "CHOLHDL",  "LDLDIRECT" in the last 72 hours. Thyroid Function Tests: No results for input(s): "TSH", "T4TOTAL", "FREET4", "T3FREE", "THYROIDAB" in the last 72 hours. Anemia Panel: No results for input(s): "VITAMINB12", "FOLATE", "FERRITIN", "TIBC", "IRON", "RETICCTPCT" in the last 72 hours. Sepsis Labs: No results for input(s): "PROCALCITON", "LATICACIDVEN" in the last 168 hours.  No results found for this or any previous visit (from the past 240 hour(s)).   Radiology Studies: No  results found.  Scheduled Meds:  dextrose  12.5 g Intravenous STAT   docusate sodium  200 mg Oral Daily   enoxaparin (LOVENOX) injection  40 mg Subcutaneous Q24H   feeding supplement (GLUCERNA SHAKE)  237 mL Oral TID BM   insulin aspart  0-15 Units Subcutaneous TID WC   insulin aspart  0-5 Units Subcutaneous QHS   insulin aspart  5 Units Subcutaneous TID WC   insulin glargine-yfgn  15 Units Subcutaneous Daily   mirtazapine  30 mg Oral QHS   pantoprazole  20 mg Oral Daily   rivastigmine  1.5 mg Oral BID   Continuous Infusions:  sodium chloride 75 mL/hr at 04/11/23 2102   potassium chloride      LOS: 0 days   Marguerita Merles, DO [facility] Available via Epic secure chat 7am-7pm After these hours, please refer to coverage provider listed on [email or web address] 04/11/2023, 9:07 PM

## 2023-04-11 NOTE — Consult Note (Signed)
Consultation Note Date: 04/11/2023   Patient Name: Victoria Gates  DOB: 08-Apr-1951  MRN: [id number]  Age / Sex: 72 y.o., female  PCP: Donato Schultz, DO Referring Physician: Merlene Laughter, DO  Reason for Consultation: Establishing goals of care  HPI/Patient Profile: 72 y.o. female   admitted on 04/10/2023 with  medical history significant of IIDM who was recently started on insulin,, advanced dementia, multiple myeloma in remission, brought in by family members for poorly controlled diabetes.   Patient has a longstanding insulin independent diabetes, managed with only Januvia p.o. 50 mg daily.  Starting in May this year, family started noticed patient glucose become poorly controlled with most readings above 300 at home.    She was hospitalized 2 weeks ago for uncontrolled diabetes and was discharged on Trebisa 10 units daily and after 1 week family reported continuous increased glucose of more than 300 readings at home.  2 days ago she was started on sliding scale, and family reported slightly improved glucose control however patient has had poor p.o. intake since starting on insulin 2 weeks ago and became dehydrated.    Last week, family also noticed patient has had increased p.o. fluid intake and increased urine output however this week has been very opposite end with decreased fluid intake and decreased urine output.    Since November last year, patient has had a dental procedure and a temporary implant placed in, after which patient can only eat pured diet.      Family face treatment option decisions, advanced directive decisions and anticipatory care needs.   Clinical Assessment and Goals of Care:  This NP Lorinda Creed reviewed medical records, received report from team, assessed the patient and then meet at the patient's bedside along with her husband to discuss diagnosis, prognosis,  GOC, EOL wishes disposition and options.   Concept of Palliative Care was introduced as specialized medical care for people and their families living with serious illness.  If focuses on providing relief from the symptoms and stress of a serious illness.  The goal is to improve quality of life for both the patient and the family. Values and goals of care important to patient and family were attempted to be elicited.  Created space and opportunity for husband to explore thoughts and feelings regarding current medical situation.  He is the main caregiver at home for his wife.  He speaks to increasing care needs in the home.  Education offered on the natural trajectory of end-stage dementia specifically  to how it relates to p.o. intake, secondary to pocketing or dysphagia.    A  discussion was had today regarding advanced directives.  Concepts specific to code status, artifical feeding and hydration, continued IV antibiotics and rehospitalization was had.      Education offered today regarding  the importance of continued conversation with family and the medical providers regarding overall plan of care and treatment options,  ensuring decisions are within the context of the patients values and GOCs.  Patient's son and daughter will be visiting tomorrow hope is that we can meet at the bedside for family meeting.  I left contact information with Mr. Hanneken he will call me with an expected time for meeting.    MOST form introduced and Hard Choices booklet left for review    Questions and concerns addressed.  Family encouraged to call with questions or concerns.     PMT will continue to support holistically.          No documented H POA or advanced care planning documents noted. Patient's husband is main Management consultant and next of kin       SUMMARY OF RECOMMENDATIONS    Code Status/Advance Care Planning: Full code    Palliative Prophylaxis:  Aspiration, Delirium Protocol,  Frequent Pain Assessment, and Oral Care  Additional Recommendations (Limitations, Scope, Preferences): Full Scope Treatment  Psycho-social/Spiritual:  Desire for further Chaplaincy support:no Emotional support offered  Prognosis:  Unable to determine  Discharge Planning: To Be Determined      Primary Diagnoses: Present on Admission:  Hypernatremia  Acute metabolic encephalopathy  AKI (acute kidney injury) (HCC)   I have reviewed the medical record, interviewed the patient and family, and examined the patient. The following aspects are pertinent.  Past Medical History:  Diagnosis Date   Allergic rhinitis 01/02/2009   Bradycardia 04/29/2016   Cervical Radiculopathy 02/01/2010   Left side   Chest pain 04/15/2012   Ex MV: Ex time 10 mins, EF 63%, no ischemia/infarct. Occas PAC's/PVC's.   Dementia (HCC)    Diabetes mellitus, type II 03/18/2008   Encounter for antineoplastic chemotherapy 04/29/2016   Esophageal Stricture 04/15/2007   s/p dilitation   GERD (gastroesophageal reflux disease) 09/21/2010   Herpes zoster 11/05/2010   Hiatal Hernia 04/15/2007   History of colonic polyps 04/24/2007   Hyperplastic only   Hypercholesterolemia 07/03/2009   Hypokalemia 11/30/2015   Iron deficiency anemia 04/24/2007   Multiple myeloma (HCC) 02/2014   Osteoporosis 04/24/2007   Social History   Socioeconomic History   Marital status: Married    Spouse name: Not on file   Number of children: Not on file   Years of education: Not on file   Highest education level: Associate degree: academic program  Occupational History   Occupation: Scientist, research (physical sciences): [address]  Tobacco Use   Smoking status: Never   Smokeless tobacco: Never  Vaping Use   Vaping status: Never Used  Substance and Sexual Activity   Alcohol use: No   Drug use: No   Sexual activity: Not Currently  Other Topics Concern   Not on file  Social History Narrative   Lives in [place] with spouse.    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   Right Handed   Social Determinants of Health   Financial Resource Strain: Low Risk  (02/03/2023)   Overall Financial Resource Strain (CARDIA)    Difficulty of Paying Living Expenses: Not hard at all  Food Insecurity: No Food Insecurity (04/10/2023)   Hunger Vital Sign    Worried About Running Out of Food in the Last Year: Never true    Ran Out of Food in the Last Year: Never true  Transportation Needs: No Transportation Needs (04/10/2023)   PRAPARE - Administrator, Civil Service (Medical): No    Lack of Transportation (Non-Medical): No  Physical Activity: Insufficiently Active (02/03/2023)   Exercise Vital Sign    Days of Exercise per Week:  1 day    Minutes of Exercise per Session: 30 min  Stress: No Stress Concern Present (02/03/2023)   [employer] of Occupational Health - Occupational Stress Questionnaire    Feeling of Stress : Not at all  Social Connections: Moderately Isolated (02/03/2023)   Social Connection and Isolation Panel [NHANES]    Frequency of Communication with Friends and Family: Once a week    Frequency of Social Gatherings with Friends and Family: Once a week    Attends Religious Services: 1 to 4 times per year    Active Member of [employer] or Organizations: No    Attends Engineer, structural: Not on file    Marital Status: Married   Family History  Problem Relation Age of Onset   Brain cancer Mother    Heart disease Mother    Lung cancer Father    Heart disease Father    Diabetes Sister    Heart disease Sister    Heart attack Sister    ALS Sister    Diabetes Brother    Heart disease Brother    Cancer Neg Hx        No FH of Colon Cancer   Colon cancer Neg Hx    Esophageal cancer Neg Hx    Rectal cancer Neg Hx    Stomach cancer Neg Hx    Scheduled Meds:  docusate sodium  200 mg Oral Daily   enoxaparin (LOVENOX) injection  40 mg Subcutaneous Q24H   feeding supplement (GLUCERNA  SHAKE)  237 mL Oral TID BM   insulin aspart  0-15 Units Subcutaneous TID WC   insulin aspart  0-5 Units Subcutaneous QHS   insulin aspart  8 Units Subcutaneous TID WC   insulin glargine-yfgn  15 Units Subcutaneous Daily   mirtazapine  30 mg Oral QHS   pantoprazole  20 mg Oral Daily   pravastatin  80 mg Oral q1800   rivastigmine  1.5 mg Oral BID   Continuous Infusions:  sodium chloride 150 mL/hr at 04/11/23 0618   PRN Meds:.ondansetron **OR** ondansetron (ZOFRAN) IV Medications Prior to Admission:  Prior to Admission medications   Medication Sig Start Date End Date Taking? Authorizing Provider  docusate sodium (COLACE) 100 MG capsule Take 100 mg by mouth daily.   Yes [provider]  feeding supplement, ENSURE COMPLETE, (ENSURE COMPLETE) LIQD Take 237 mLs by mouth 3 (three) times daily.   Yes [provider]  insulin degludec (TRESIBA FLEXTOUCH) 100 UNIT/ML FlexTouch Pen Inject 10 Units into the skin daily. 04/07/23  Yes Seabron Spates R, DO  insulin lispro (HUMALOG KWIKPEN) 100 UNIT/ML KwikPen PER SLIDING SCALE----- NO MORE THAN 40 U / DAY Patient taking differently: Inject 0-40 Units into the skin 3 (three) times daily as needed (Blood Sugar). PER SLIDING SCALE----- NO MORE THAN 40 U / DAY 03/31/23  Yes Seabron Spates R, DO  lansoprazole (PREVACID) 30 MG capsule Take 30 mg by mouth daily with breakfast.    Yes [provider]  mirtazapine (REMERON SOL-TAB) 30 MG disintegrating tablet TAKE 1 TABLET BY MOUTH EVERYDAY AT BEDTIME 02/17/23  Yes Donato Schultz, DO  Multiple Vitamin (MULTIVITAMIN WITH MINERALS) TABS tablet Take 1 tablet by mouth daily. 08/05/22  Yes Noralee Stain, DO  Pitavastatin Calcium 4 MG TABS TAKE 1 TABLET BY MOUTH EVERY DAY 02/17/23  Yes Seabron Spates R, DO  rivastigmine (EXELON) 1.5 MG capsule Take 1 capsule (1.5 mg total) by mouth 2 (two) times daily.  Patient taking differently: Take 1.5 mg by mouth 2 (two) times daily. 0600  and 1200 05/15/22  Yes Patel, Donika K, DO  sitaGLIPtin (JANUVIA) 100 MG tablet Take 1 tablet (100 mg total) by mouth daily. 04/07/23  Yes Seabron Spates R, DO  glucose blood test strip 1 each by Other route 4 (four) times daily. Dx: type 2 DM with long term insulin use E11.22; Z79.4 04/07/23   Zola Button, Grayling Congress, DO  Insulin Pen Needle (NOVOFINE PEN NEEDLE) 32G X 6 MM MISC As directed 03/31/23   Donato Schultz, DO   Allergies  Allergen Reactions   Sulfonamide Derivatives Other (See Comments)    Unknown reaction    Atorvastatin Other (See Comments)    myalgias   Rosuvastatin Other (See Comments)    myalgias   Review of Systems  Unable to perform ROS: Dementia    Physical Exam Constitutional:      Appearance: She is normal weight.  Cardiovascular:     Rate and Rhythm: Normal rate.  Pulmonary:     Effort: Pulmonary effort is normal.  Skin:    General: Skin is warm and dry.  Neurological:     Mental Status: She is alert.  Psychiatric:        Speech: She is noncommunicative.        Cognition and Memory: Cognition is impaired. Memory is impaired.     Comments: -Nonverbal     Vital Signs: BP 120/74 (BP Location: Right Arm)   Pulse 76   Temp 98.7 F (37.1 C) (Oral)   Resp 16   SpO2 98%  Pain Scale: Faces       SpO2: SpO2: 98 % O2 Device:SpO2: 98 % O2 Flow Rate: .   IO: Intake/output summary:  Intake/Output Summary (Last 24 hours) at 04/11/2023 1249 Last data filed at 04/11/2023 4098 Gross per 24 hour  Intake 2000 ml  Output --  Net 2000 ml    LBM: Last BM Date : 04/09/23 Baseline Weight:   Most recent weight:       Palliative Assessment/Data:    75  minutes  Signed by: Lorinda Creed, NP   Please contact Palliative Medicine Team phone at [phone] for questions and concerns.  For individual provider: See Loretha Stapler

## 2023-04-11 NOTE — Progress Notes (Signed)
[facility] night cross cover note:   I was notified by RN patient is unable to swallow applesauce.  I  the patient n.p.o., and have ordered formal ST consult for the morning for swallow evaluation.    Victoria Pigg, DO Hospitalist

## 2023-04-12 ENCOUNTER — Inpatient Hospital Stay (HOSPITAL_COMMUNITY): Payer: Medicare HMO

## 2023-04-12 DIAGNOSIS — R739 Hyperglycemia, unspecified: Secondary | ICD-10-CM | POA: Diagnosis not present

## 2023-04-12 DIAGNOSIS — R531 Weakness: Secondary | ICD-10-CM

## 2023-04-12 DIAGNOSIS — Z515 Encounter for palliative care: Secondary | ICD-10-CM | POA: Diagnosis not present

## 2023-04-12 DIAGNOSIS — E870 Hyperosmolality and hypernatremia: Secondary | ICD-10-CM | POA: Diagnosis not present

## 2023-04-12 DIAGNOSIS — N179 Acute kidney failure, unspecified: Secondary | ICD-10-CM | POA: Diagnosis not present

## 2023-04-12 DIAGNOSIS — F03C0 Unspecified dementia, severe, without behavioral disturbance, psychotic disturbance, mood disturbance, and anxiety: Secondary | ICD-10-CM | POA: Diagnosis not present

## 2023-04-12 DIAGNOSIS — G9341 Metabolic encephalopathy: Secondary | ICD-10-CM | POA: Diagnosis not present

## 2023-04-12 LAB — COMPREHENSIVE METABOLIC PANEL
ALT: 289 U/L — ABNORMAL HIGH (ref 0–44)
AST: 203 U/L — ABNORMAL HIGH (ref 15–41)
Albumin: 2.4 g/dL — ABNORMAL LOW (ref 3.5–5.0)
Alkaline Phosphatase: 104 U/L (ref 38–126)
Anion gap: 6 (ref 5–15)
BUN: 20 mg/dL (ref 8–23)
CO2: 22 mmol/L (ref 22–32)
Calcium: 8.3 mg/dL — ABNORMAL LOW (ref 8.9–10.3)
Chloride: 116 mmol/L — ABNORMAL HIGH (ref 98–111)
Creatinine, Ser: 0.86 mg/dL (ref 0.44–1.00)
GFR, Estimated: >60 mL/min (ref >60)
Glucose, Bld: 191 mg/dL — ABNORMAL HIGH (ref 70–99)
Potassium: 3.8 mmol/L (ref 3.5–5.1)
Sodium: 144 mmol/L (ref 135–145)
Total Bilirubin: 0.7 mg/dL (ref 0.3–1.2)
Total Protein: 6.2 g/dL — ABNORMAL LOW (ref 6.5–8.1)

## 2023-04-12 LAB — GLUCOSE, CAPILLARY
Glucose-Capillary: 203 mg/dL — ABNORMAL HIGH (ref 70–99)
Glucose-Capillary: 331 mg/dL — ABNORMAL HIGH (ref 70–99)
Glucose-Capillary: 322 mg/dL — ABNORMAL HIGH (ref 70–99)
Glucose-Capillary: 408 mg/dL — ABNORMAL HIGH (ref 70–99)
Glucose-Capillary: 328 mg/dL — ABNORMAL HIGH (ref 70–99)

## 2023-04-12 LAB — MAGNESIUM: Magnesium: 1.9 mg/dL (ref 1.7–2.4)

## 2023-04-12 LAB — CBC WITH DIFFERENTIAL/PLATELET
Abs Immature Granulocytes: 0.02 10*3/uL (ref 0.00–0.07)
Basophils Absolute: 0 10*3/uL (ref 0.0–0.1)
Basophils Relative: 0 %
Eosinophils Absolute: 0.1 10*3/uL (ref 0.0–0.5)
Eosinophils Relative: 1 %
HCT: 37.7 % (ref 36.0–46.0)
Hemoglobin: 11.7 g/dL — ABNORMAL LOW (ref 12.0–15.0)
Immature Granulocytes: 0 %
Lymphocytes Relative: 37 %
Lymphs Abs: 3.2 10*3/uL (ref 0.7–4.0)
MCH: 26.9 pg (ref 26.0–34.0)
MCHC: 31 g/dL (ref 30.0–36.0)
MCV: 86.7 fL (ref 80.0–100.0)
Monocytes Absolute: 0.6 10*3/uL (ref 0.1–1.0)
Monocytes Relative: 7 %
Neutro Abs: 4.8 10*3/uL (ref 1.7–7.7)
Neutrophils Relative %: 55 %
Platelets: 78 10*3/uL — ABNORMAL LOW (ref 150–400)
RBC: 4.35 MIL/uL (ref 3.87–5.11)
RDW: 14.1 % (ref 11.5–15.5)
WBC: 8.6 10*3/uL (ref 4.0–10.5)
nRBC: 0 % (ref 0.0–0.2)

## 2023-04-12 LAB — HEPATITIS PANEL, ACUTE
HCV Ab: NONREACTIVE
Hep A IgM: NONREACTIVE
Hep B C IgM: NONREACTIVE
Hepatitis B Surface Ag: NONREACTIVE

## 2023-04-12 LAB — PHOSPHORUS: Phosphorus: 2.1 mg/dL — ABNORMAL LOW (ref 2.5–4.6)

## 2023-04-12 MED ORDER — POTASSIUM PHOSPHATES 15 MMOLE/5ML IV SOLN
20.0000 mmol | Freq: Once | INTRAVENOUS | Status: AC
  Administered 2023-04-12: 20 mmol via INTRAVENOUS
  Filled 2023-04-12: qty 6.67

## 2023-04-12 MED ORDER — INSULIN ASPART 100 UNIT/ML IJ SOLN
6.0000 [IU] | Freq: Once | INTRAMUSCULAR | Status: AC
  Administered 2023-04-12: 6 [IU] via SUBCUTANEOUS

## 2023-04-12 MED ORDER — SODIUM CHLORIDE 0.45 % IV SOLN: INTRAVENOUS | Status: AC

## 2023-04-12 NOTE — Progress Notes (Signed)
PROGRESS NOTE    Victoria Gates  XBM:[id number] DOB: 1951-01-21 DOA: 04/10/2023 PCP: Donato Schultz, DO   Brief Narrative:  HPI per Dr. Mikey College on 04/10/23 HPI: Victoria Gates is a 72 y.o. female with medical history significant of IIDM who was recently started on insulin, morbid obesity, advanced dementia, multiple myeloma in remission, brought in by family members for poorly controlled diabetes.   Patient has a longstanding insulin independent diabetes, managed with only Januvia p.o. 50 mg daily.  Starting in May this year, family started noticed patient glucose become poorly controlled with most readings above 300 at home.  She was hospitalized 2 weeks ago for uncontrolled diabetes and was discharged on Trebisa 10 units daily and after 1 week family reported continuous increased glucose of more than 300 readings at home.  2 days ago she was started on sliding scale, and family reported slightly improved glucose control however patient has had poor p.o. intake since starting on insulin 2 weeks ago and became dehydrated.  Last week, family also noticed patient has had increased p.o. fluid intake and increased urine output however this week has been very opposite end with decreased fluid intake and decreased urine output.  Since November last year, patient has had a dental procedure and a temporary implant placed in, after which patient can only eat pured diet.  Family reported acute appetite and patient gained about 20 pounds compared to November 2023.  Currently, patient has no major complaints, denies any pain nauseous vomiting diarrhea.  **Interim History  Continues to remain confused and had difficulty swallowing so she was made n.p.o. and SLP evaluated.  SLP reevaluated later on and she is placed on dysphagia 1 thin liquid diet. Na+ is Improved but LFTs elevated so will obtain U/S. Palliative Consulted for GOC but husband declined. If continues to Improve and LFTs improve likely can be  discharged in the next 24-48 hours.   Assessment and Plan:  Severe Dehydration Acute Hypernatremia, improved; See below -Corrected sodium level 162, calculated free water deficit 5.9 L.   -Plan to correct in 2 days, with a rate of 10-12 mmol/day, and was changed to IV fluid to half-normal saline at 150 MLS per hour but reduced the rate to 75 mL/hr and will continue for only 12 more hours, will encourage increased p.o. fluid intake -Recheck Na level every 6 hours today and daily BMP -Na+ Trend: Recent Labs  Lab 03/24/23 1054 03/28/23 2334 03/29/23 0039 04/10/23 1053 04/10/23 1128 04/11/23 0829 04/12/23 0251  NA 131* 138 143 156* 158* 153*  153* 144  -Continue to Monitor and Trend -Repeat CMP in the AM  Starvation Ketoacidosis -Combined Decompensated high anion gap metabolic acidosis and acute respiratory alkalosis -Correct volume status and fluid level then reevaluate -IVF as below    IDDM with Hyperglycemia -Clinically suspect loss of insulin sensitivity due to significant weight gain since November last year. -Increase long-acting insulin to 15 units daily, and start NovoLog 5 unit 3 times daily AC, plus a sliding scale -CBG and Glucose Trend: Recent Labs  Lab 03/24/23 1054 03/24/23 1101 03/28/23 2334 03/29/23 0155 04/10/23 1053 04/10/23 1054 04/11/23 0829 04/11/23 1140 04/12/23 0251 04/12/23 0730 04/12/23 1237  GLUCOSE 431*  --  530*  --  460*  --  167*  --  191*  --   --   GLUCAP  --    < >  --    < >  --    < >  --    < >  --    < >  331*   < > = values in this interval not displayed.  -Hold Januvia -Consider outpatient Ozempic  Hypokalemia -Patient's K+ Level Trend: Recent Labs  Lab 03/24/23 1054 03/28/23 2334 03/29/23 0039 04/10/23 1053 04/10/23 1128 04/11/23 0829 04/12/23 0251  K 3.8 3.9 4.4 4.4 4.2 3.4* 3.8  -Replete with IV Kcl 40 mEQ yesterday and with IV K Phos 20 mmol today -Continue to Monitor and Replete as Necessary -Repeat CMP in the AM    Hypophosphatemia -Phos Level Trend: Recent Labs  Lab 04/11/23 0829 04/12/23 0251  PHOS 2.9 2.1*  -Replete with IV K Phos 20 mmol -Continue to Monitor and Trend and Repeat CMP in the AM   Abnormal LFTs, improving -? In the setting of Severe Dehydration -LFT Trend: Recent Labs  Lab 03/24/23 1054 03/28/23 2334 04/11/23 0829 04/12/23 0251  AST 27 32 198* 203*  ALT 44 40 342* 289*  -Check RUQ U/S and Acute Hepatitis Panel -U/S done and showed "Very limited examination. Liver appears to be heterogeneous. No definite biliary dilatation. Probable small decompressed gallbladder but limited evaluation."  -Acute Hepatitis Panel Negative  -Repeat CMP in the AM     Hx of MM -In remission, last follow-up with oncology was in May 2024 and was told condition was stable.   Advanced Dementia -Mentation at baseline per husband -Palliative care consulted for further goals of care discussion and plan is to try and have a meeting patient remains a full code and husband has declined an offer for family meeting and that the family wants all offered and available medical interventions to prolong the patient's life  Normocytic Anemia -Likely Dilutional Drop from IVF -Hgb/Hct Trend: Recent Labs  Lab 03/24/23 1054 03/28/23 2334 03/29/23 0039 04/10/23 1053 04/10/23 1128 04/11/23 0829 04/12/23 0251  HGB 12.7 13.8 14.3 14.1 14.6 12.5 11.7*  HCT 38.5 43.0 42.0 46.4* 43.0 40.6 37.7  MCV 83.0 84.8  --  89.4  --  91.0 86.7  -Check Anemia Panel in the AM -Continue to Monitor for S/Sx of Bleeding; no overt bleeding noted -Repeat CBC in the AM   Stage II Right Gluteal Pressure Ulcer Pressure Injury 04/10/23 Buttocks Right Stage 2 -  Partial thickness loss of dermis presenting as a shallow open injury with a red, pink wound bed without slough. (Active)  04/10/23   Location: Buttocks  Location Orientation: Right  Staging: Stage 2 -  Partial thickness loss of dermis presenting as a shallow open  injury with a red, pink wound bed without slough.  Wound Description (Comments):   Present on Admission: Yes  -No signs of infection -Consult wound care   Chronic Dysphagia -Previously on Pureed diet -SLP to Evaluate and Treat and will continue Dysphagia 1 Diet   Thrombocytopenia, slightly worsening  -Platelet Count Trend: Recent Labs  Lab 03/24/23 1054 03/28/23 2334 04/10/23 1053 04/11/23 0829 04/12/23 0251  PLT 149* 187 98* 80* 78*  -Continue to Monitor for S/Sx of Bleeding; no overt bleeding noted -Repeat CBC in the AM  AKI, improved -BUN/Cr Trend: Recent Labs  Lab 03/24/23 1054 03/28/23 2334 04/10/23 1053 04/11/23 0829 04/12/23 0251  BUN 22 30* 36* 19 20  CREATININE 1.03* 1.20* 1.47* 1.07* 0.86  -C/w 1/2 NS but reduce rate from 150 mL/hr to 75 mL/hr -Avoid Nephrotoxic Medications, Contrast Dyes, Hypotension and Dehydration to Ensure Adequate Renal Perfusion and will need to Renally Adjust Meds -Continue to Monitor and Trend Renal Function carefully and repeat CMP in the AM   Hypoalbuminemia -Patient's  Albumin Trend: Recent Labs  Lab 03/24/23 1054 03/28/23 2334 04/11/23 0829 04/12/23 0251  ALBUMIN 3.4* 3.9 2.6* 2.4*  -Continue to Monitor and Trend and repeat CMP in the AM  DVT prophylaxis: enoxaparin (LOVENOX) injection 40 mg Start: 04/10/23 1345    Code Status: Full Code Family Communication: No family present at bedside but called Husband and it went to VM and then called and was able to speak with the Daughter  Disposition Plan:  Level of care: Med-Surg Status is: Inpatient Remains inpatient appropriate because: Improving and Anticipating D/C in the next 24-48 hours   Consultants:  Palliative Care Medicine  Procedures:  As delineated as above  Antimicrobials:  Anti-infectives (From admission, onward)    None       Subjective: Seen and examined at bedside and was sleeping and did not really arouse on examination.  Husband is not at bedside  today.  Per nurse she states that the patient ate most of her food this morning with assistance of her husband and was at her baseline.  The patient is nurse stated that her husband had said that the patient stays up most of the night and then sleeps during the day mainly.  No other concerns or complaints at this time.  Objective: Vitals:   04/11/23 0612 04/11/23 0726 04/11/23 1935 04/12/23 0504  BP: 118/75 120/74 (!) 86/59 112/69  Pulse: 75 76 98 79  Resp: 16 16 15 20   Temp: 98.6 F (37 C) 98.7 F (37.1 C) 98.4 F (36.9 C) 98.8 F (37.1 C)  TempSrc: Axillary Oral Oral Oral  SpO2: 100% 98% 91% 98%    Intake/Output Summary (Last 24 hours) at 04/12/2023 1550 Last data filed at 04/12/2023 1400 Gross per 24 hour  Intake 2539.55 ml  Output 400 ml  Net 2139.55 ml   There were no vitals filed for this visit.  Examination: Physical Exam:  Constitutional: Chronically ill-appearing African-American female in no acute distress and is somnolent and drowsy and asleep Respiratory: Diminished to auscultation bilaterally, no wheezing, rales, rhonchi or crackles. Normal respiratory effort and patient is not tachypenic. No accessory muscle use. Unlabored breathing  Cardiovascular: RRR, no murmurs / rubs / gallops. S1 and S2 auscultated. No extremity edema.  Abdomen: Soft, non-tender, non-distended. Bowel sounds positive.  GU: Deferred. Musculoskeletal: No clubbing / cyanosis of digits/nails. No joint deformity upper and lower extremities.  Skin: No rashes, lesions, ulcers on a limited skin evaluation. No induration; Warm and dry.  Neurologic: Somnolent and drowsy and sleeping  Psychiatric: Impaired judgment and insight.   Data Reviewed: I have personally reviewed following labs and imaging studies  CBC: Recent Labs  Lab 04/10/23 1053 04/10/23 1128 04/11/23 0829 04/12/23 0251  WBC 8.8  --  8.3 8.6  NEUTROABS 6.4  --  4.7 4.8  HGB 14.1 14.6 12.5 11.7*  HCT 46.4* 43.0 40.6 37.7  MCV 89.4   --  91.0 86.7  PLT 98*  --  80* 78*   Basic Metabolic Panel: Recent Labs  Lab 04/10/23 1053 04/10/23 1128 04/11/23 0829 04/12/23 0251  NA 156* 158* 153*  153* 144  K 4.4 4.2 3.4* 3.8  CL 116*  --  122* 116*  CO2 22  --  23 22  GLUCOSE 460*  --  167* 191*  BUN 36*  --  19 20  CREATININE 1.47*  --  1.07* 0.86  CALCIUM 9.5  --  8.5* 8.3*  MG  --   --  1.9 1.9  PHOS  --   --  2.9 2.1*   GFR: Estimated Creatinine Clearance: 66.7 mL/min (by C-G formula based on SCr of 0.86 mg/dL). Liver Function Tests: Recent Labs  Lab 04/11/23 0829 04/12/23 0251  AST 198* 203*  ALT 342* 289*  ALKPHOS 111 104  BILITOT 1.0 0.7  PROT 6.8 6.2*  ALBUMIN 2.6* 2.4*   No results for input(s): "LIPASE", "AMYLASE" in the last 168 hours. No results for input(s): "AMMONIA" in the last 168 hours. Coagulation Profile: No results for input(s): "INR", "PROTIME" in the last 168 hours. Cardiac Enzymes: No results for input(s): "CKTOTAL", "CKMB", "CKMBINDEX", "TROPONINI" in the last 168 hours. BNP (last 3 results) No results for input(s): "PROBNP" in the last 8760 hours. HbA1C: No results for input(s): "HGBA1C" in the last 72 hours. CBG: Recent Labs  Lab 04/11/23 1601 04/11/23 1709 04/11/23 2116 04/12/23 0730 04/12/23 1237  GLUCAP 64* 124* 250* 203* 331*   Lipid Profile: No results for input(s): "CHOL", "HDL", "LDLCALC", "TRIG", "CHOLHDL", "LDLDIRECT" in the last 72 hours. Thyroid Function Tests: No results for input(s): "TSH", "T4TOTAL", "FREET4", "T3FREE", "THYROIDAB" in the last 72 hours. Anemia Panel: No results for input(s): "VITAMINB12", "FOLATE", "FERRITIN", "TIBC", "IRON", "RETICCTPCT" in the last 72 hours. Sepsis Labs: No results for input(s): "PROCALCITON", "LATICACIDVEN" in the last 168 hours.  No results found for this or any previous visit (from the past 240 hour(s)).   Radiology Studies: US Abdomen Limited RUQ (LIVER/GB)  Result Date: 04/12/2023 CLINICAL DATA:  Abnormal  LFTs. EXAM: ULTRASOUND ABDOMEN LIMITED RIGHT UPPER QUADRANT COMPARISON:  CT abdomen 07/31/2022 and ultrasound 07/31/2022. FINDINGS: Gallbladder: Small hypoechoic structure in the expected region of the gallbladder may represent a decompressed gallbladder. There was a similar appearance on the ultrasound from 2023. No definite cholelithiasis. Common bile duct: Diameter: 0.2 cm. Liver: Limited evaluation of the liver. The liver appears to be heterogeneous. Could not evaluate the portal vein on this examination. Other: None. IMPRESSION: 1. Very limited examination. Liver appears to be heterogeneous. No definite biliary dilatation. 2. Probable small decompressed gallbladder but limited evaluation. Electronically Signed   By: Richarda Overlie M.D.   On: 04/12/2023 12:31    Scheduled Meds:  dextrose  12.5 g Intravenous STAT   docusate sodium  200 mg Oral Daily   enoxaparin (LOVENOX) injection  40 mg Subcutaneous Q24H   feeding supplement (GLUCERNA SHAKE)  237 mL Oral TID BM   insulin aspart  0-15 Units Subcutaneous TID WC   insulin aspart  0-5 Units Subcutaneous QHS   insulin aspart  5 Units Subcutaneous TID WC   insulin glargine-yfgn  15 Units Subcutaneous Daily   mirtazapine  30 mg Oral QHS   pantoprazole  20 mg Oral Daily   rivastigmine  1.5 mg Oral BID   Continuous Infusions:  sodium chloride     potassium PHOSPHATE IVPB (in mmol) 20 mmol (04/12/23 1108)    LOS: 1 day   Marguerita Merles, DO [facility] Available via Epic secure chat 7am-7pm After these hours, please refer to coverage provider listed on [email or web address] 04/12/2023, 3:50 PM

## 2023-04-12 NOTE — Progress Notes (Signed)
Patient ID: KANYIAH STEFANEK, female   DOB: 11/11/1950, 72 y.o.   MRN: [id number]    Progress Note from the Palliative Medicine Team at [facility]   Patient Name: BRENAY NEUFER        Date: 04/12/2023 DOB: [birth year]  Age: 73 y.o. MRN#: [id number] Attending Physician: Merlene Laughter, DO Primary Care Physician: Zola Button, Grayling Congress, DO Admit Date: 04/10/2023    Extensive chart review has been completed prior to meeting with patient/family  including labs, vital signs, imaging, progress/consult notes, orders, medications and available advance directive documents.    This NP assessed patient at the bedside as a follow up to  yesterday's GOCs meeting.  Husband at bedside.    Continue conversation regarding current medical situation specific to the natural trajectory of end-stage dementia and overall failure to thrive  Education offered today regarding  the importance of continued conversation with family and their  medical providers regarding overall plan of care and treatment options,  ensuring decisions are within the context of the patients values and GOCs.  At this time family is open to all offered and available medical interventions to prolong life.  Patient remains a full code  Husband declines offer for family meeting  Questions and concerns addressed     PMT will continue to support holistically  Time:  35 minutes  Detailed review of medical records ( labs, imaging, vital signs), medically appropriate exam ( MS, skin, resp)   discussed with treatment team, counseling and education to patient, family, staff, documenting clinical information, medication management, coordination of care    Lorinda Creed NP  Palliative Medicine Team Team Phone # [phone] Pager [phone]

## 2023-04-12 NOTE — Discharge Instructions (Addendum)
  Daily Schedule Check glucose before the meals Take Tresiba insulin first thing in the morning Based on what your glucose level is, you look at the sliding scale to see how much insulin you need. You also add the Novolog meal coverage dose with what the correction scale dose and give them at the same time in one injection before the meals (breakfast, lunch, dinner)

## 2023-04-12 NOTE — Plan of Care (Signed)

## 2023-04-12 NOTE — Progress Notes (Signed)
TRH night cross cover note:   I was notified by RN of CBG result of 408. I subsequently ordered novolog 6 units sq x 1 now, with repeat cbg to be checked around 2100, and asked to be notified if ensuing cbg result remains greater than 400.      Victoria Pigg, DO Hospitalist

## 2023-04-12 NOTE — TOC Initial Note (Signed)
Transition of Care [facility]) - Initial/Assessment Note    Patient Details  Name: Victoria Gates MRN: [id number] Date of Birth: Jul 21, 1951  Transition of Care [facility]) CM/SW Contact:    Victoria Bacon, RN Phone Number: 04/12/2023, 3:49 PM  Clinical Narrative:  Spoke with daughter Victoria Gates, requested [facility] RN services. [facility] RN services arranged through Victoria Gates.                Expected Discharge Plan: Home w Home Health Services Barriers to Discharge: Continued Medical Work up   Patient Goals and CMS Choice Patient states their goals for this hospitalization and ongoing recovery are:: to go home          Expected Discharge Plan and Services       Living arrangements for the past 2 months: Single Family Home                           HH Arranged: RN HH Agency: [facility] Date [facility] Agency Contacted: 04/12/23 Time HH Agency Contacted: 1538 Representative spoke with at [facility] Agency: Victoria Gates  Prior Living Arrangements/Services Living arrangements for the past 2 months: Single Family Home Lives with:: Spouse Patient language and need for interpreter reviewed:: Yes Do you feel safe going back to the place where you live?: Yes      Need for Family Participation in Patient Care: Yes (Comment) Care giver support system in place?: Yes (comment)   Criminal Activity/Legal Involvement Pertinent to Current Situation/Hospitalization: No - Comment as needed  Activities of Daily Living Home Assistive Devices/Equipment: None ADL Screening (condition at time of admission) Patient's cognitive ability adequate to safely complete daily activities?: No Is the patient deaf or have difficulty hearing?: Yes Does the patient have difficulty seeing, even when wearing glasses/contacts?: Yes Does the patient have difficulty concentrating, remembering, or making decisions?: Yes Patient able to express need for assistance with ADLs?: No Does the patient have difficulty dressing or  bathing?: Yes Independently performs ADLs?: No Communication: Dependent Is this a change from baseline?: Pre-admission baseline Dressing (OT): Dependent Is this a change from baseline?: Pre-admission baseline Grooming: Dependent Is this a change from baseline?: Pre-admission baseline Feeding: Dependent Is this a change from baseline?: Pre-admission baseline Bathing: Dependent Is this a change from baseline?: Pre-admission baseline Toileting: Dependent Is this a change from baseline?: Pre-admission baseline In/Out Bed: Dependent Is this a change from baseline?: Pre-admission baseline Walks in Home: Dependent Is this a change from baseline?: Pre-admission baseline Does the patient have difficulty walking or climbing stairs?: Yes Weakness of Legs: Both Weakness of Arms/Hands: Both  Permission Sought/Granted Permission sought to share information with : Magazine features editor                Emotional Assessment         Alcohol / Substance Use: Not Applicable Psych Involvement: No (comment)  Admission diagnosis:  Dehydration [E86.0] Hypernatremia [E87.0] Hyperglycemia [R73.9] Acute kidney injury (HCC) [N17.9] Patient Active Problem List   Diagnosis Date Noted   Palliative care by specialist 04/11/2023   Hyperglycemia 04/11/2023   Severe dementia without behavioral disturbance, psychotic disturbance, mood disturbance, or anxiety (HCC) 04/11/2023   Uncontrolled type 2 diabetes mellitus with hyperglycemia (HCC) 02/03/2023   Pressure injury of buttock, stage 2 (HCC) 08/10/2022   Pressure injury of skin 08/08/2022   Chronic liver failure (HCC) 08/07/2022   Anemia 08/07/2022   Malnutrition of moderate degree 08/03/2022   AKI (acute  kidney injury) (HCC) 08/03/2022   Acute metabolic encephalopathy 08/03/2022   Transaminitis 08/03/2022   Hypernatremia 08/02/2022   Altered mental status 08/02/2022   Lactic acidosis 08/02/2022   DKA (diabetic ketoacidosis) (HCC)  07/31/2022   Mild protein-calorie malnutrition (HCC) 01/14/2022   Preventative health care 07/12/2021   Hyperlipidemia associated with type 2 diabetes mellitus (HCC) 07/12/2021   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HCC) 04/03/2021   Pre-op examination 04/03/2021   Hyperlipidemia 07/03/2020   Mild neurocognitive disorder, 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   Bradycardia 04/29/2016   Hypokalemia 11/30/2015   Neutropenia (HCC) 05/03/2015   Syncope 09/11/2014   Bronchitis 09/08/2014   Multiple myeloma (HCC) 10/22/2013   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   Hyperproteinemia 04/14/2013   Midsternal chest pain 04/20/2012   Lipoma of shoulder 11/02/2011   Herpes zoster 11/05/2010   GERD (gastroesophageal reflux disease) 09/21/2010   Cervical Radiculopathy, Left 02/01/2010   Hypercholesterolemia 07/03/2009   Otitis Externa 07/03/2009   Cerumen impaction 07/03/2009   Myalgia 07/03/2009   Allergic rhinitis 01/02/2009   Type 2 diabetes mellitus treated with insulin (HCC) 03/18/2008   Chest pain 12/24/2007   URI 12/12/2007   Anemia-Iron Deficiency 04/24/2007   Essential hypertension 04/24/2007   Osteoporosis 04/24/2007   Esophageal Stricture 04/15/2007   Hiatal Hernia 04/15/2007   PCP:  Donato Schultz, DO Pharmacy:   [id number] [place], [place] - [address] [place] [address] Phone: [phone] Fax: [phone]  [facility] [address] [place] [address] Phone: [phone] Fax: [phone]  [id number] - [place], [place] - [address] [place] [address] Phone: [phone] Fax: [phone]     Social Determinants of Health (SDOH) Social History: SDOH Screenings   Food Insecurity: No Food Insecurity (04/10/2023)  Housing: Patient Unable To Answer (04/10/2023)  Transportation Needs: No Transportation  Needs (04/10/2023)  Utilities: Not At Risk (04/10/2023)  Depression (PHQ2-9): Low Risk  (10/28/2022)  Financial Resource Strain: Low Risk  (02/03/2023)  Physical Activity: Insufficiently Active (02/03/2023)  Social Connections: Moderately Isolated (02/03/2023)  Stress: No Stress Concern Present (02/03/2023)  Tobacco Use: Low Risk  (04/10/2023)   SDOH Interventions:     Readmission Risk Interventions     No data to display

## 2023-04-13 DIAGNOSIS — E870 Hyperosmolality and hypernatremia: Secondary | ICD-10-CM | POA: Diagnosis not present

## 2023-04-13 DIAGNOSIS — R739 Hyperglycemia, unspecified: Secondary | ICD-10-CM | POA: Diagnosis not present

## 2023-04-13 DIAGNOSIS — N179 Acute kidney failure, unspecified: Secondary | ICD-10-CM | POA: Diagnosis not present

## 2023-04-13 DIAGNOSIS — G9341 Metabolic encephalopathy: Secondary | ICD-10-CM | POA: Diagnosis not present

## 2023-04-13 LAB — CBC WITH DIFFERENTIAL/PLATELET
Abs Immature Granulocytes: 0.01 10*3/uL (ref 0.00–0.07)
Basophils Absolute: 0 10*3/uL (ref 0.0–0.1)
Basophils Relative: 0 %
Eosinophils Absolute: 0.1 10*3/uL (ref 0.0–0.5)
Eosinophils Relative: 1 %
HCT: 38.2 % (ref 36.0–46.0)
Hemoglobin: 12.5 g/dL (ref 12.0–15.0)
Immature Granulocytes: 0 %
Lymphocytes Relative: 40 %
Lymphs Abs: 3.1 10*3/uL (ref 0.7–4.0)
MCH: 28.5 pg (ref 26.0–34.0)
MCHC: 32.7 g/dL (ref 30.0–36.0)
MCV: 87.2 fL (ref 80.0–100.0)
Monocytes Absolute: 0.7 10*3/uL (ref 0.1–1.0)
Monocytes Relative: 9 %
Neutro Abs: 4 10*3/uL (ref 1.7–7.7)
Neutrophils Relative %: 50 %
Platelets: 78 10*3/uL — ABNORMAL LOW (ref 150–400)
RBC: 4.38 MIL/uL (ref 3.87–5.11)
RDW: 14.2 % (ref 11.5–15.5)
WBC: 7.8 10*3/uL (ref 4.0–10.5)
nRBC: 0 % (ref 0.0–0.2)

## 2023-04-13 LAB — GLUCOSE, CAPILLARY
Glucose-Capillary: 159 mg/dL — ABNORMAL HIGH (ref 70–99)
Glucose-Capillary: 286 mg/dL — ABNORMAL HIGH (ref 70–99)

## 2023-04-13 LAB — COMPREHENSIVE METABOLIC PANEL
ALT: 223 U/L — ABNORMAL HIGH (ref 0–44)
AST: 110 U/L — ABNORMAL HIGH (ref 15–41)
Albumin: 2.4 g/dL — ABNORMAL LOW (ref 3.5–5.0)
Alkaline Phosphatase: 101 U/L (ref 38–126)
Anion gap: 7 (ref 5–15)
BUN: 15 mg/dL (ref 8–23)
CO2: 23 mmol/L (ref 22–32)
Calcium: 8.6 mg/dL — ABNORMAL LOW (ref 8.9–10.3)
Chloride: 112 mmol/L — ABNORMAL HIGH (ref 98–111)
Creatinine, Ser: 0.88 mg/dL (ref 0.44–1.00)
GFR, Estimated: >60 mL/min (ref >60)
Glucose, Bld: 148 mg/dL — ABNORMAL HIGH (ref 70–99)
Potassium: 3.4 mmol/L — ABNORMAL LOW (ref 3.5–5.1)
Sodium: 142 mmol/L (ref 135–145)
Total Bilirubin: 0.6 mg/dL (ref 0.3–1.2)
Total Protein: 6.3 g/dL — ABNORMAL LOW (ref 6.5–8.1)

## 2023-04-13 LAB — PHOSPHORUS: Phosphorus: 2.9 mg/dL (ref 2.5–4.6)

## 2023-04-13 LAB — MAGNESIUM: Magnesium: 1.9 mg/dL (ref 1.7–2.4)

## 2023-04-13 MED ORDER — GLUCERNA SHAKE PO LIQD: 237.0000 mL | Freq: Three times a day (TID) | ORAL | 0 refills | Status: DC

## 2023-04-13 MED ORDER — PITAVASTATIN CALCIUM 4 MG PO TABS: 1.0000 | ORAL_TABLET | Freq: Every day | ORAL | 3 refills | Status: DC

## 2023-04-13 MED ORDER — POTASSIUM CHLORIDE 10 MEQ/100ML IV SOLN
10.0000 meq | INTRAVENOUS | Status: DC
  Filled 2023-04-13: qty 100

## 2023-04-13 MED ORDER — POTASSIUM CHLORIDE 20 MEQ PO PACK
40.0000 meq | PACK | Freq: Once | ORAL | Status: AC
  Administered 2023-04-13: 40 meq via ORAL
  Filled 2023-04-13: qty 2

## 2023-04-13 MED ORDER — ONDANSETRON HCL 4 MG PO TABS: 4.0000 mg | ORAL_TABLET | Freq: Four times a day (QID) | ORAL | 0 refills | Status: DC | PRN

## 2023-04-13 NOTE — TOC Transition Note (Signed)
Transition of Care [facility]) - CM/SW Discharge Note   Patient Details  Name: Victoria Gates MRN: [id number] Date of Birth: 06-08-1951  Transition [facility] Care [facility]) CM/SW Contact:  Ronny Bacon, RN Phone Number: 04/13/2023, 11:28 AM   Clinical Narrative:  Patient being discharged home today. Delila Spence aware of patient being discharged home.     Final next level of care: Home w Home Health Services Barriers to Discharge: No Barriers Identified   Patient Goals and CMS Choice      Discharge Placement                         Discharge Plan and Services Additional resources added to the After Visit Summary for                            [facility] Arranged: RN [facility] Agency: [facility] Date [facility] Agency Contacted: 04/12/23 Time HH Agency Contacted: 1538 Representative spoke with at [facility] Agency: Kandee Keen  Social Determinants of Health (SDOH) Interventions SDOH Screenings   Food Insecurity: No Food Insecurity (04/10/2023)  Housing: Patient Unable To Answer (04/10/2023)  Transportation Needs: No Transportation Needs (04/10/2023)  Utilities: Not At Risk (04/10/2023)  Depression (PHQ2-9): Low Risk  (10/28/2022)  Financial Resource Strain: Low Risk  (02/03/2023)  Physical Activity: Insufficiently Active (02/03/2023)  Social Connections: Moderately Isolated (02/03/2023)  Stress: No Stress Concern Present (02/03/2023)  Tobacco Use: Low Risk  (04/10/2023)     Readmission Risk Interventions     No data to display

## 2023-04-13 NOTE — Discharge Summary (Signed)
Physician Discharge Summary   Patient: Victoria Gates MRN: [id number] DOB: 03-10-1951  Admit date:     04/10/2023  Discharge date: 04/13/23  Discharge Physician: Marguerita Merles, DO   PCP: Donato Schultz, DO   Recommendations at discharge:   Follow-up with PCP within 1 to 2 weeks and repeat CBC, CMP, mag, Phos within 1 week Follow-up with neurology in outpatient setting  Discharge Diagnoses: Principal Problem:   Hypernatremia Active Problems:   AKI (acute kidney injury) (HCC)   Acute metabolic encephalopathy   Palliative care by specialist   Hyperglycemia   Severe dementia without behavioral disturbance, psychotic disturbance, mood disturbance, or anxiety (HCC)  Resolved Problems:   * No resolved hospital problems. [facility] Course: HPI per Dr. Mikey College on 04/10/23 HPI: Victoria Gates is a 72 y.o. female with medical history significant of IIDM who was recently started on insulin, morbid obesity, advanced dementia, multiple myeloma in remission, brought in by family members for poorly controlled diabetes.   Patient has a longstanding insulin independent diabetes, managed with only Januvia p.o. 50 mg daily.  Starting in May this year, family started noticed patient glucose become poorly controlled with most readings above 300 at home.  She was hospitalized 2 weeks ago for uncontrolled diabetes and was discharged on Trebisa 10 units daily and after 1 week family reported continuous increased glucose of more than 300 readings at home.  2 days ago she was started on sliding scale, and family reported slightly improved glucose control however patient has had poor p.o. intake since starting on insulin 2 weeks ago and became dehydrated.  Last week, family also noticed patient has had increased p.o. fluid intake and increased urine output however this week has been very opposite end with decreased fluid intake and decreased urine output.  Since November last year, patient has had a  dental procedure and a temporary implant placed in, after which patient can only eat pured diet.  Family reported acute appetite and patient gained about 20 pounds compared to November 2023.  Currently, patient has no major complaints, denies any pain nauseous vomiting diarrhea.  **Interim History  Continues to remain confused and had difficulty swallowing so she was made n.p.o. and SLP evaluated.  SLP reevaluated later on and she is placed on dysphagia 1 thin liquid diet. Na+ is Improved but LFTs elevated so will obtain U/S. Palliative Consulted for GOC but husband declined.  LFTs continue to improve and she was stable and improved she will need to follow-up with PCP within 1 week as well as neurology outpatient setting.  Assessment and Plan:  Severe Dehydration proved Acute Hypernatremia, improved; See below -Corrected sodium level 162, calculated free water deficit 5.9 L.   -Plan to correct in 2 days, with a rate of 10-12 mmol/day, and was changed to IV fluid to half-normal saline at 150 MLS per hour but reduced the rate to 75 mL/hr and will continue for only 12 more hours, will encourage increased p.o. fluid intake -Recheck Na level every 6 hours today and daily BMP -Na+ Trend: Recent Labs  Lab 03/28/23 2334 03/29/23 0039 04/10/23 1053 04/10/23 1128 04/11/23 0829 04/12/23 0251 04/13/23 0142  NA 138 143 156* 158* 153*  153* 144 142  -Continue to Monitor and Trend -Repeat CMP within 1 week  Starvation Ketoacidosis -Combined Decompensated high anion gap metabolic acidosis and acute respiratory alkalosis -Correct volume status and fluid level then reevaluate -IVF as below and she is much improved  IDDM with Hyperglycemia -Clinically suspect loss of insulin sensitivity due to significant weight gain since November last year. -Increase long-acting insulin to 15 units daily, and start NovoLog 5 unit 3 times daily AC, plus a sliding scale -CBG and Glucose Trend: Recent Labs  Lab  03/24/23 1054 03/24/23 1101 03/28/23 2334 03/29/23 0155 04/10/23 1053 04/10/23 1054 04/11/23 0829 04/11/23 1140 04/12/23 0251 04/12/23 0730 04/13/23 0142 04/13/23 0741 04/13/23 1136  GLUCOSE 431*  --  530*  --  460*  --  167*  --  191*  --  148*  --   --   GLUCAP  --    < >  --    < >  --    < >  --    < >  --    < >  --    < > 286*   < > = values in this interval not displayed.  -Hold Januvia -Consider outpatient Ozempic at discharge  Hypokalemia -Patient's K+ Level Trend: Recent Labs  Lab 03/28/23 2334 03/29/23 0039 04/10/23 1053 04/10/23 1128 04/11/23 0829 04/12/23 0251 04/13/23 0142  K 3.9 4.4 4.4 4.2 3.4* 3.8 3.4*  -Continue to Monitor and Replete as Necessary -Repeat CMP in 1 week  Hypophosphatemia -Phos Level Trend: Recent Labs  Lab 04/11/23 0829 04/12/23 0251 04/13/23 0142  PHOS 2.9 2.1* 2.9  -Replete with IV K Phos 20 mmol -Continue to Monitor and Trend and Repeat CMP in the AM   Abnormal LFTs, improving -? In the setting of Severe Dehydration -LFT Trend: Recent Labs  Lab 03/24/23 1054 03/28/23 2334 04/11/23 0829 04/12/23 0251 04/13/23 0142  AST 27 32 198* 203* 110*  ALT 44 40 342* 289* 223*  -Check RUQ U/S and Acute Hepatitis Panel -U/S done and showed "Very limited examination. Liver appears to be heterogeneous. No definite biliary dilatation. Probable small decompressed gallbladder but limited evaluation."  -Acute Hepatitis Panel Negative  -Repeat CMP within 1 week    Hx of MM -In remission, last follow-up with oncology was in May 2024 and was told condition was stable.   Advanced Dementia -Mentation at baseline per husband -Palliative care consulted for further goals of care discussion and plan is to try and have a meeting patient remains a full code and husband has declined an offer for family meeting and that the family wants all offered and available medical interventions to prolong the patient's life  Normocytic Anemia -Likely  Dilutional Drop from IVF -Hgb/Hct Trend: Recent Labs  Lab 03/28/23 2334 03/29/23 0039 04/10/23 1053 04/10/23 1128 04/11/23 0829 04/12/23 0251 04/13/23 0142  HGB 13.8 14.3 14.1 14.6 12.5 11.7* 12.5  HCT 43.0 42.0 46.4* 43.0 40.6 37.7 38.2  MCV 84.8  --  89.4  --  91.0 86.7 87.2  -Check Anemia Panel in the outpatient setting  -Continue to Monitor for S/Sx of Bleeding; no overt bleeding noted -Repeat CBC within 1 week   Stage II Right Gluteal Pressure Ulcer Pressure Injury 04/10/23 Buttocks Right Stage 2 -  Partial thickness loss of dermis presenting as a shallow open injury with a red, pink wound bed without slough. (Active)  04/10/23   Location: Buttocks  Location Orientation: Right  Staging: Stage 2 -  Partial thickness loss of dermis presenting as a shallow open injury with a red, pink wound bed without slough.  Wound Description (Comments):   Present on Admission: Yes  -No signs of infection -Consult wound care   Chronic Dysphagia -Previously on Pureed diet -SLP to Evaluate  and Treat and will continue Dysphagia 1 Diet   Thrombocytopenia, slightly worsening  -Platelet Count Trend: Recent Labs  Lab 03/24/23 1054 03/28/23 2334 04/10/23 1053 04/11/23 0829 04/12/23 0251 04/13/23 0142  PLT 149* 187 98* 80* 78* 78*  -Continue to Monitor for S/Sx of Bleeding; no overt bleeding noted -Repeat CBC within 1 week  AKI, improved -BUN/Cr Trend: Recent Labs  Lab 03/24/23 1054 03/28/23 2334 04/10/23 1053 04/11/23 0829 04/12/23 0251 04/13/23 0142  BUN 22 30* 36* 19 20 15   CREATININE 1.03* 1.20* 1.47* 1.07* 0.86 0.88  -C/w 1/2 NS but reduce rate from 150 mL/hr to 75 mL/hr and now will discontinue IV fluids given her improvement -Avoid Nephrotoxic Medications, Contrast Dyes, Hypotension and Dehydration to Ensure Adequate Renal Perfusion and will need to Renally Adjust Meds -Continue to Monitor and Trend Renal Function carefully and repeat CMP in 1  week  Hypoalbuminemia -Patient's Albumin Trend: Recent Labs  Lab 03/24/23 1054 03/28/23 2334 04/11/23 0829 04/12/23 0251 04/13/23 0142  ALBUMIN 3.4* 3.9 2.6* 2.4* 2.4*  -Continue to Monitor and Trend and repeat CMP in the AM  Pressure Ulcer, poA Active Pressure Injury/Wound(s)     Pressure Ulcer  Duration          Pressure Injury 08/07/22 Sacrum Stage 2 -  Partial thickness loss of dermis presenting as a shallow open injury with a red, pink wound bed without slough. 251 days   Pressure Injury 04/10/23 Buttocks Right Stage 2 -  Partial thickness loss of dermis presenting as a shallow open injury with a red, pink wound bed without slough. 5 days           Consultants: Palliative Care Procedures performed: As delineated above Disposition: Home health Diet recommendation:  Cardiac and Carb modified diet DISCHARGE MEDICATION: Allergies as of 04/13/2023       Reactions   Sulfonamide Derivatives Other (See Comments)   Unknown reaction    Atorvastatin Other (See Comments)   myalgias   Rosuvastatin Other (See Comments)   myalgias        Medication List     TAKE these medications    docusate sodium 100 MG capsule Commonly known as: COLACE Take 100 mg by mouth daily.   feeding supplement (GLUCERNA SHAKE) Liqd Take 237 mLs by mouth 3 (three) times daily between meals. What changed: when to take this   glucose blood test strip 1 each by Other route 4 (four) times daily. Dx: type 2 DM with long term insulin use E11.22; Z79.4   insulin lispro 100 UNIT/ML KwikPen Commonly known as: HumaLOG KwikPen PER SLIDING SCALE----- NO MORE THAN 40 U / DAY What changed:  how much to take how to take this when to take this reasons to take this   lansoprazole 30 MG capsule Commonly known as: PREVACID Take 30 mg by mouth daily with breakfast.   mirtazapine 30 MG disintegrating tablet Commonly known as: REMERON SOL-TAB TAKE 1 TABLET BY MOUTH EVERYDAY AT BEDTIME    multivitamin with minerals Tabs tablet Take 1 tablet by mouth daily.   Novofine Pen Needle 32G X 6 MM Misc Generic drug: Insulin Pen Needle As directed   ondansetron 4 MG tablet Commonly known as: ZOFRAN Take 1 tablet (4 mg total) by mouth every 6 (six) hours as needed for nausea.   Pitavastatin Calcium 4 MG Tabs Take 1 tablet (4 mg total) by mouth daily. Start taking on: April 21, 2023 What changed: These instructions start on April 21, 2023. If you  are unsure what to do until then, ask your doctor or other care provider.   rivastigmine 1.5 MG capsule Commonly known as: EXELON Take 1 capsule (1.5 mg total) by mouth 2 (two) times daily. What changed: additional instructions   sitaGLIPtin 100 MG tablet Commonly known as: Januvia Take 1 tablet (100 mg total) by mouth daily.               Discharge Care Instructions  (From admission, onward)           Start     Ordered   04/13/23 0000  Discharge wound care:       Comments: Foam dressing to right buttock, change Q 3 days or PRN soiling   04/13/23 1107            Follow-up Information     [facility], [facility] Follow up.   Specialty: Home Health Services Why: Home health nursing services. Office will call to arrange follow up. Contact information: [address] [place] [address] [phone]                Discharge Exam: There were no vitals filed for this visit. Vitals:   04/13/23 0401 04/13/23 0738  BP: 133/79 139/87  Pulse: 87 84  Resp: 17 16  Temp: 98.3 F (36.8 C) 97.8 F (36.6 C)  SpO2: 99% 99%   Examination: Physical Exam:  Constitutional: WN/WD chronically ill-appearing African-American female in no acute distress and is pleasantly demented Respiratory: Diminished to auscultation bilaterally, no wheezing, rales, rhonchi or crackles. Normal respiratory effort and patient is not tachypenic. No accessory muscle use.  Cardiovascular: RRR, no murmurs / rubs /  gallops. S1 and S2 auscultated.  Minimal extremity edema Abdomen: Soft, non-tender, non-distended. Bowel sounds positive.  GU: Deferred. Musculoskeletal: No clubbing / cyanosis of digits/nails. No joint deformity upper and lower extremities.  Skin: No rashes, lesions, ulcers limited skin evaluation. No induration; Warm and dry.  Neurologic: CN 2-12 grossly intact with no focal deficits. Romberg sign and cerebellar reflexes not assessed.  Psychiatric: Impaired judgment and insight and she is pleasantly demented but she is awake  Condition at discharge: stable  The results of significant diagnostics from this hospitalization (including imaging, microbiology, ancillary and laboratory) are listed below for reference.   Imaging Studies: US Abdomen Limited RUQ (LIVER/GB)  Result Date: 04/12/2023 CLINICAL DATA:  Abnormal LFTs. EXAM: ULTRASOUND ABDOMEN LIMITED RIGHT UPPER QUADRANT COMPARISON:  CT abdomen 07/31/2022 and ultrasound 07/31/2022. FINDINGS: Gallbladder: Small hypoechoic structure in the expected region of the gallbladder may represent a decompressed gallbladder. There was a similar appearance on the ultrasound from 2023. No definite cholelithiasis. Common bile duct: Diameter: 0.2 cm. Liver: Limited evaluation of the liver. The liver appears to be heterogeneous. Could not evaluate the portal vein on this examination. Other: None. IMPRESSION: 1. Very limited examination. Liver appears to be heterogeneous. No definite biliary dilatation. 2. Probable small decompressed gallbladder but limited evaluation. Electronically Signed   By: Richarda Overlie M.D.   On: 04/12/2023 12:31   CT Head Wo Contrast  Result Date: 03/24/2023 CLINICAL DATA:  Mental status change. EXAM: CT HEAD WITHOUT CONTRAST TECHNIQUE: Contiguous axial images were obtained from the base of the skull through the vertex without intravenous contrast. RADIATION DOSE REDUCTION: This exam was performed according to the departmental  dose-optimization program which includes automated exposure control, adjustment of the mA and/or kV according to patient size and/or use of iterative reconstruction technique. COMPARISON:  CT examination dated  July 31, 2022 FINDINGS: Brain: No evidence of acute infarction, hemorrhage, hydrocephalus, extra-axial collection or mass lesion/mass effect. Cerebral atrophy severe in bilateral temporal lobes. Patchy areas of low-attenuation of the periventricular white matter presumed chronic microvascular ischemic changes. Vascular: No hyperdense vessel or unexpected calcification. Skull: Normal. Negative for fracture or focal lesion. Sinuses/Orbits: No acute finding. Other: None. IMPRESSION: 1. No acute intracranial abnormality. 2. Cerebral atrophy severe in bilateral temporal lobes. 3. Chronic microvascular ischemic changes of the white matter. Electronically Signed   By: Larose Hires D.O.   On: 03/24/2023 12:35    Microbiology: Results for orders placed or performed during the hospital encounter of 07/31/22  Blood Culture (routine x 2)     Status: None   Collection Time: 07/31/22  8:49 AM   Specimen: BLOOD  Result Value Ref Range Status   Specimen Description BLOOD LEFT ANTECUBITAL  Final   Special Requests   Final    BOTTLES DRAWN AEROBIC AND ANAEROBIC Blood Culture adequate volume   Culture   Final    NO GROWTH 5 DAYS Performed at [facility], [address], [place], [place] [address]    Report Status 08/05/2022 FINAL  Final  Urine Culture     Status: None   Collection Time: 07/31/22  8:49 AM   Specimen: In/Out Cath Urine  Result Value Ref Range Status   Specimen Description IN/OUT CATH URINE  Final   Special Requests NONE  Final   Culture   Final    NO GROWTH Performed at [facility], [address], [place], [place] [address]    Report Status 08/01/2022 FINAL  Final   Labs: CBC: Recent Labs  Lab 04/10/23 1053 04/10/23 1128 04/11/23 0829 04/12/23 0251  04/13/23 0142  WBC 8.8  --  8.3 8.6 7.8  NEUTROABS 6.4  --  4.7 4.8 4.0  HGB 14.1 14.6 12.5 11.7* 12.5  HCT 46.4* 43.0 40.6 37.7 38.2  MCV 89.4  --  91.0 86.7 87.2  PLT 98*  --  80* 78* 78*   Basic Metabolic Panel: Recent Labs  Lab 04/10/23 1053 04/10/23 1128 04/11/23 0829 04/12/23 0251 04/13/23 0142  NA 156* 158* 153*  153* 144 142  K 4.4 4.2 3.4* 3.8 3.4*  CL 116*  --  122* 116* 112*  CO2 22  --  23 22 23   GLUCOSE 460*  --  167* 191* 148*  BUN 36*  --  19 20 15   CREATININE 1.47*  --  1.07* 0.86 0.88  CALCIUM 9.5  --  8.5* 8.3* 8.6*  MG  --   --  1.9 1.9 1.9  PHOS  --   --  2.9 2.1* 2.9   Liver Function Tests: Recent Labs  Lab 04/11/23 0829 04/12/23 0251 04/13/23 0142  AST 198* 203* 110*  ALT 342* 289* 223*  ALKPHOS 111 104 101  BILITOT 1.0 0.7 0.6  PROT 6.8 6.2* 6.3*  ALBUMIN 2.6* 2.4* 2.4*   CBG: Recent Labs  Lab 04/12/23 1620 04/12/23 1947 04/12/23 2128 04/13/23 0741 04/13/23 1136  GLUCAP 322* 408* 328* 159* 286*   Discharge time spent: greater than 30 minutes.  Signed: Marguerita Merles, DO [facility] 04/15/2023

## 2023-04-13 NOTE — Inpatient Diabetes Management (Addendum)
Inpatient Diabetes Program Recommendations  AACE/ADA: New Consensus Statement on Inpatient Glycemic Control (2015)  Target Ranges:  Prepandial:   less than 140 mg/dL      Peak postprandial:   less than 180 mg/dL (1-2 hours)      Critically ill patients:  140 - 180 mg/dL   Lab Results  Component Value Date   GLUCAP 159 (H) 04/13/2023   HGBA1C 6.8 (H) 02/03/2023    Review of Glycemic Control  Latest Reference Range & Units 04/12/23 07:30 04/12/23 12:37 04/12/23 16:20 04/12/23 19:47 04/12/23 21:28 04/13/23 07:41  Glucose-Capillary 70 - 99 mg/dL 295 (H) 284 (H) 132 (H) 408 (H) 328 (H) 159 (H)    Diabetes history: DM 2 Outpatient Diabetes medications: Tresiba 10 units, Januvia 100 mg Daily, Humalog 2-10 units  Current orders for Inpatient glycemic control:  Novolog 0-15 units tid + hs Semglee 15 units Daily Novolog 5 units tid meal coverage  Glucerna tid between meals  Inpatient Diabetes Program Recommendations:    Trends are higher over the last 24 hours will try to increase meal coverage back up to 8 units and see if pt can tolerate dose. From Friday when pt had a hypoglycemia episode pt was not eating a lot due to testing.  -  Increase Novolog meal coverage to 8 units tid   With pts dementia, would not be too aggressive with glucose control to avoid hypoglycemia, although family may disagree.   Thanks,  Christena Deem RN, MSN, BC-ADM Inpatient Diabetes Coordinator Team Pager [phone] (8a-5p)

## 2023-04-14 ENCOUNTER — Ambulatory Visit (INDEPENDENT_AMBULATORY_CARE_PROVIDER_SITE_OTHER): Payer: Medicare HMO | Admitting: Pharmacist

## 2023-04-14 DIAGNOSIS — N1831 Chronic kidney disease, stage 3a: Secondary | ICD-10-CM

## 2023-04-14 DIAGNOSIS — Z794 Long term (current) use of insulin: Secondary | ICD-10-CM

## 2023-04-14 DIAGNOSIS — E1165 Type 2 diabetes mellitus with hyperglycemia: Secondary | ICD-10-CM

## 2023-04-14 DIAGNOSIS — E1122 Type 2 diabetes mellitus with diabetic chronic kidney disease: Secondary | ICD-10-CM

## 2023-04-14 DIAGNOSIS — R7989 Other specified abnormal findings of blood chemistry: Secondary | ICD-10-CM

## 2023-04-14 NOTE — Progress Notes (Signed)
04/14/2023 Name: [name] MRN: [id number] DOB: 1950-12-29  Chief Complaint  Patient presents with   Diabetes   Hyperlipidemia   Medication Management    Victoria Gates is a 72 y.o. year old female who presented for a telephone visit.   They were referred to the pharmacist by their PCP for assistance in managing diabetes.  Spoke with patient's husband today. Patient has dementia and he administers medications.    Subjective:  Care Team: Primary Care Provider: Zola Button, Grayling Congress, DO ; Next Scheduled Visit: not currently scheduled Neurologist: Dr Allena Katz; Next Scheduled Visit: 05/12/2023 Hematology / Oncology - Dr Ivonne Andrew; Next Scheduled Visit: 09/23/2023 Patient has been referred back to endocrinology but referral has not been completed yet.   Patient presented to ED 04/10/2023 - admitted to hospital 04/10/2023 to 04/13/20[facility] for hyperglycemia, dehydration and elevated LFTs.  Blood glucose on arrival in ED was 387 on point of care check but was 460 on BMET checked around the same time. Patient given 5 units of insulin aspart and transferred to inpatient.  Blood glucose 5 hours later was 236 and then 12 hours later 252.  On 04/11/2023 was 179 >> 129  >> 64 >> 124 >> 250.  On 04/12/2023 was 203 >> 331 >> 322 >> 408 (6 units)  >> 328.  On 04/13/2023 was 159 >> 286 Also note elevated LFTs and ARI on arrival.  eFGR was 38 on admission but improved to > 60 by discharge.   04/12/2023 Abdominal ultrasound due to elevated LFTs IMPRESSION: 1. Very limited examination. Liver appears to be heterogeneous. No definite biliary dilatation.2. Probable small decompressed gallbladder but limited evaluation. Medication Changes at discharge:  Started ondansetron 4mg  every 6 hours as needed for nausea.  Glucerna changed to drink 3 times a day between meals   Current medications: Januvia 100mg  daily, Tresiba 10 units daily - verified Rx was picked up 04/07/2023 at [facility].   Novolog sliding scale (blood glucose 200 to 250 - give 2 units; 251 to 300 - give 4 units; 301 to 350 - give 6 units 351 to 400 give 8 units, if blood glucose > 400 take 10 units and call physician)  Medications tried in the past: metformin - tried 07/2022 - stopped due to nausea and vomiting.  Current glucose readings - checking 3 to 4 times a day: 7/14 (discharged from hospital around 1pm) at 4pm was 180; at 10pm was 263 (gave 4 units of Humalog) 07/15 - 8am was 167 (gave 10 units of [place] and took Januvia 100mg  daily)   Patient denies hypoglycemic s/sx including no dizziness, shakiness, sweating. 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  Patient denies hyperglycemic symptoms including no polyuria, polydipsia, polyphagia, nocturia, neuropathy, blurred vision.  Current meal patterns:  Husband reports he feels like blood glucose increases after applesauce ([employer] regular single serve) in morning. She is also drinking 1 Ensure each morning.    Hyperlipidemia/ASCVD Risk Reduction  Current lipid lowering medications: pitavastatin 4mg  daily - currently on hold due to elevated LFTs.  Patient has been taking Livalo / pitavastatin since 2020. LFTs have been WNL in past.   Medications tried in the past: atorvastatin and rosuvastatin - both caused myalgias  Antiplatelet regimen: none   Objective:  Lab Results  Component Value Date   HGBA1C 6.8 (H) 02/03/2023    Lab Results  Component Value Date   CREATININE 0.88 04/13/2023   BUN 15 04/13/2023   NA 142 04/13/2023   K 3.4 (L) 04/13/2023   CL 112 (H) 04/13/2023   CO2 23 04/13/2023    Lab Results  Component Value Date   CHOL 171 02/03/2023   HDL 36.10 (L) 02/03/2023   LDLCALC 103 (H) 08/12/2022   LDLDIRECT 106.0 02/03/2023   TRIG 287.0 (H) 02/03/2023   CHOLHDL 5 02/03/2023    Medications Reviewed Today      Reviewed by Henrene Pastor, RPH-CPP (Pharmacist) on 04/14/23 at 1025  Med List Status: <None>   Medication Order Taking? Sig Documenting Provider Last Dose Status Informant  docusate sodium (COLACE) 100 MG capsule [id number] Yes Take 100 mg by mouth daily. [provider] Taking Active Spouse/Significant Other, Pharmacy Records  feeding supplement, GLUCERNA SHAKE, (GLUCERNA SHAKE) LIQD [id number] Yes Take 237 mLs by mouth 3 (three) times daily between meals. Marguerita Merles [place], [place] Taking Active   glucose blood test strip [id number] Yes 1 each by Other route 4 (four) times daily. Dx: type 2 DM with long term insulin use E11.22; Z79.4 Zola Button, Grayling Congress, DO Taking Active Spouse/Significant Other, Pharmacy Records  insulin degludec (TRESIBA FLEXTOUCH) 100 UNIT/ML FlexTouch Pen [id number] Yes Inject 10 Units into the skin daily. Donato Schultz, DO Taking Active Spouse/Significant Other, Pharmacy Records  insulin lispro (HUMALOG KWIKPEN) 100 UNIT/ML KwikPen [id number] Yes PER SLIDING SCALE----- NO MORE THAN 40 U / DAY  Patient taking differently: Inject 0-12 Units into the skin 3 (three) times daily as needed (Blood Sugar). PER SLIDING SCALE----- NO MORE THAN 40 U / DAY   Lowne Chase, Grayling Congress, DO Taking Active Spouse/Significant Other, Pharmacy Records  Insulin Pen Needle (NOVOFINE PEN NEEDLE) 32G X 6 MM MISC [id number] Yes As directed Donato Schultz, DO Taking Active Spouse/Significant Other, Pharmacy Records  lansoprazole (PREVACID) 30 MG capsule [id number] Yes Take 30 mg by mouth daily with breakfast.  [provider] Taking Active Spouse/Significant Other, Pharmacy Records           Med Note Chilton Si, STACY L   Wed May 01, 2016  8:45 AM)    mirtazapine (REMERON SOL-TAB) 30 MG disintegrating tablet [id number] Yes TAKE 1 TABLET BY MOUTH EVERYDAY AT BEDTIME Donato Schultz, DO Taking Active Spouse/Significant Other, Pharmacy Records  Multiple Vitamin (MULTIVITAMIN WITH  MINERALS) TABS tablet [id number] Yes Take 1 tablet by mouth daily. Noralee Stain, DO Taking Active Spouse/Significant Other, Pharmacy Records           Med Note Epimenio Sarin, Carlota Raspberry Mar 24, 2023  2:27 PM)    ondansetron [facility]) 4 MG tablet [id number] Yes Take 1 tablet (4 mg total) by mouth every 6 (six) hours as needed for nausea. Marguerita Merles [place], [place] Taking Active   Pitavastatin Calcium 4 MG TABS [id number] No Take 1 tablet (4 mg total) by mouth daily.  Patient not taking: Reported on 04/14/2023   Merlene Laughter, DO Not Taking Active            Med Note Clydie Braun, Inda Merlin Apr 14, 2023 10:25 AM) On hold due to elevated LFTs  rivastigmine (EXELON) 1.5 MG capsule [id number] Yes Take 1 capsule (1.5 mg total) by mouth 2 (two) times daily.  Patient taking differently: Take 1.5 mg by mouth 2 (two) times daily. 0600 and 1200   [place] Chard, DO Taking Active Spouse/Significant Other, Pharmacy Records  sitaGLIPtin [facility])  100 MG tablet [id number] Yes Take 1 tablet (100 mg total) by mouth daily. Donato Schultz, DO Taking Active Spouse/Significant Other, Pharmacy Records              Assessment/Plan:   Diabetes: Currently uncontrolled - Reviewed goal A1c, goal fasting, and goal 2 hour post prandial glucose - continue with change to Glucerna nutritional drink 3 imtes a day - ContinueTresiba 10 units once a daily for now. Will check back with family tomorrow and adjust depending on blood glucose reading and if she is using correction Humalog dose frequently.  Continue Januvia 100mg  daily. Consider SGLT2 in future but will wait to see if LFTs improve.  Continue to use Novolog per sliding scale if needed.  - Reviewed s/s of hypoglycemia and how to treat.  - Again suggested trial of Continuous Glucose Monitor so that lows and highs could be detected and so patient doesn't have to stick fingers 4 times a day but patient's husband declined.    Hyperlipidemia/ASCVD Risk  Reduction: Currently controlled. But recent elevated LFTs.  Continue to hold pitavastatin.  Will check with PCP about when she would recommend LFTs to be rechecked. Home Health - [facility] is suppose to start coming to home soon and they might be able to do a blood draw if needed.   Patient's husband also asked about getting hydration / IV fluids once per week. [facility] is not able to so this and not sure if is needed but will ask PCP about recommendations.     Follow Up Plan: tomorrow to review blood glucose and make adjustments in insulin if needed.   Henrene Pastor, PharmD Clinical Pharmacist [facility]

## 2023-04-15 ENCOUNTER — Telehealth: Payer: Self-pay

## 2023-04-15 ENCOUNTER — Other Ambulatory Visit: Payer: Medicare HMO | Admitting: Pharmacist

## 2023-04-15 DIAGNOSIS — Z794 Long term (current) use of insulin: Secondary | ICD-10-CM

## 2023-04-15 DIAGNOSIS — E1165 Type 2 diabetes mellitus with hyperglycemia: Secondary | ICD-10-CM

## 2023-04-15 MED ORDER — TRESIBA FLEXTOUCH 100 UNIT/ML ~~LOC~~ SOPN: 12.0000 [IU] | PEN_INJECTOR | Freq: Every day | SUBCUTANEOUS | Status: DC

## 2023-04-15 NOTE — Progress Notes (Signed)
04/15/2023 Name: Victoria Gates MRN: [id number] DOB: 1951/07/15  Chief Complaint  Patient presents with   Diabetes   Medication Management    Follow up     Victoria Gates is a 72 y.o. year old female who presented for a telephone visit.   They were referred to the pharmacist by their PCP for assistance in managing diabetes.  Spoke with patient's husband and daughter today. Patient has dementia and family administers medications.   Patient's daughter lives in [place] but is staying a few days until patient is stable.   Subjective:  Care Team: Primary Care Provider: Zola Button, Grayling Congress, DO ; Next Scheduled Visit: not currently scheduled Neurologist: Dr Allena Katz; Next Scheduled Visit: 05/12/2023 Hematology / Oncology - Dr Ivonne Andrew; Next Scheduled Visit: 09/23/2023 Patient has been referred back to endocrinology but referral has not been completed yet.   Patient presented to ED 04/10/2023 - admitted to hospital 04/10/2023 to 04/13/2023 [facility] for hyperglycemia, dehydration and elevated LFTs.  Blood glucose on arrival in ED was 387 on point of care check but was 460 on BMET checked around the same time. Patient given 5 units of insulin aspart and transferred to inpatient.  Blood glucose 5 hours later was 236 and then 12 hours later 252.  On 04/11/2023 was 179 >> 129  >> 64 >> 124 >> 250.  On 04/12/2023 was 203 >> 331 >> 322 >> 408 (6 units)  >> 328.  On 04/13/2023 was 159 >> 286 Also note elevated LFTs and ARI on arrival.  eFGR was 38 on admission but improved to > 60 by discharge.   04/12/2023 Abdominal ultrasound due to elevated LFTs IMPRESSION: 1. Very limited examination. Liver appears to be heterogeneous. No definite biliary dilatation.2. Probable small decompressed gallbladder but limited evaluation. Medication Changes at discharge:  Started ondansetron 4mg  every 6 hours as needed for nausea.  Glucerna changed to drink 3 times a day between meals   Current  medications: Januvia 100mg  daily, Tresiba 10 units daily - verified Rx was [facility].  Novolog sliding scale (blood glucose 200 to 250 - give 2 units; 251 to 300 - give 4 units; 301 to 350 - give 6 units 351 to 400 give 8 units, if blood glucose > 400 take 10 units and call physician)  Medications tried in the past: metformin - tried 07/2022 - stopped due to nausea and vomiting.  Current glucose readings - checking 3 to 4 times a day: 7/14 (discharged from hospital around 1pm) at 4pm was 180; at 10pm was 263 (gave 4 units of Humalog) 07/15 - 8am was 167 (gave 10 units of Tresiba and took [place] 100mg  daily); noon - 226 (2 units of Humalog); 5pm - 226 (gave 2 units of Humalog); 10pm - 275 (gave 4 units of Humalog) 07/16 - 7am was 211 (gave Tresiba 10 units, Januvia and 2 units of Humalog); 11:30am was 232 (will give 2 units when patient eats lunch)   Patient denies hypoglycemic s/sx including no dizziness, shakiness, sweating. 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  Patient denies hyperglycemic symptoms including no polyuria, polydipsia, polyphagia, nocturia, neuropathy, blurred vision.  Current meal patterns per daughter;  Patient is eating better. Had 3 meals yesterday Breakfast: Glucerna, water and applesauce x 2 - unsweetened Lunch: Glucerna roasted chicken (pureed) and water Evening meal: spaghetti and meatballs -  chopped finely; Glucerna and water.     Hyperlipidemia/ASCVD Risk Reduction  Current lipid lowering medications: pitavastatin 4mg  daily - currently on hold due to elevated LFTs.  Patient has been taking Livalo / pitavastatin since 2020. LFTs have been WNL in past.   Medications tried in the past: atorvastatin and rosuvastatin - both caused myalgias  Antiplatelet regimen: none   Objective:  Lab Results  Component Value Date   HGBA1C 6.8  (H) 02/03/2023    Lab Results  Component Value Date   CREATININE 0.88 04/13/2023   BUN 15 04/13/2023   NA 142 04/13/2023   K 3.4 (L) 04/13/2023   CL 112 (H) 04/13/2023   CO2 23 04/13/2023    Lab Results  Component Value Date   CHOL 171 02/03/2023   HDL 36.10 (L) 02/03/2023   LDLCALC 103 (H) 08/12/2022   LDLDIRECT 106.0 02/03/2023   TRIG 287.0 (H) 02/03/2023   CHOLHDL 5 02/03/2023    Medications Reviewed Today     Reviewed by Henrene Pastor, RPH-CPP (Pharmacist) on 04/15/23 at 1137  Med List Status: <None>   Medication Order Taking? Sig Documenting Provider Last Dose Status Informant  docusate sodium (COLACE) 100 MG capsule [id number]  Take 100 mg by mouth daily. [provider]  Active Spouse/Significant Other, Pharmacy Records  feeding supplement, GLUCERNA SHAKE, (GLUCERNA SHAKE) LIQD [id number] Yes Take 237 mLs by mouth 3 (three) times daily between meals. Marguerita Merles [place], [place] Taking Active   glucose blood test strip [id number] Yes 1 each by Other route 4 (four) times daily. Dx: type 2 DM with long term insulin use E11.22; Z79.4 Zola Button, Grayling Congress, DO Taking Active Spouse/Significant Other, Pharmacy Records  insulin degludec (TRESIBA FLEXTOUCH) 100 UNIT/ML FlexTouch Pen [id number] Yes Inject 10 Units into the skin daily. Donato Schultz, DO Taking Active Spouse/Significant Other, Pharmacy Records  insulin lispro (HUMALOG KWIKPEN) 100 UNIT/ML KwikPen [id number] Yes PER SLIDING SCALE----- NO MORE THAN 40 U / DAY  Patient taking differently: Inject 0-12 Units into the skin 3 (three) times daily as needed (Blood Sugar). PER SLIDING SCALE----- NO MORE THAN 40 U / DAY   Lowne Chase, Grayling Congress, DO Taking Active Spouse/Significant Other, Pharmacy Records  Insulin Pen Needle (NOVOFINE PEN NEEDLE) 32G X 6 MM MISC [id number] Yes As directed Donato Schultz, DO Taking Active Spouse/Significant Other, Pharmacy Records  lansoprazole (PREVACID) 30 MG capsule [id number]  Take  30 mg by mouth daily with breakfast.  [provider]  Active Spouse/Significant Other, Pharmacy Records           Med Note Chilton Si, STACY L   Wed May 01, 2016  8:45 AM)    mirtazapine (REMERON SOL-TAB) 30 MG disintegrating tablet [id number] Yes TAKE 1 TABLET BY MOUTH EVERYDAY AT BEDTIME Donato Schultz, DO Taking Active Spouse/Significant Other, Pharmacy Records  Multiple Vitamin (MULTIVITAMIN WITH MINERALS) TABS tablet [id number] Yes Take 1 tablet by mouth daily. Noralee Stain, DO Taking Active Spouse/Significant Other, Pharmacy Records           Med Note Epimenio Sarin, Carlota Raspberry Mar 24, 2023  2:27 PM)    ondansetron [facility]) 4 MG tablet [id number] Yes Take 1 tablet (4 mg total) by mouth every 6 (six) hours as needed for nausea. Marguerita Merles [place], [place] Taking Active   Pitavastatin Calcium 4 MG TABS [id number] No Take 1 tablet (4 mg total) by mouth daily.  Patient not taking: Reported on 04/14/2023   Merlene Laughter, DO  Not Taking Active            Med Note Clydie Braun, Rebeckah Masih B   Mon Apr 14, 2023 10:25 AM) On hold due to elevated LFTs  rivastigmine (EXELON) 1.5 MG capsule [id number] Yes Take 1 capsule (1.5 mg total) by mouth 2 (two) times daily.  Patient taking differently: Take 1.5 mg by mouth 2 (two) times daily. 0600 and 1200   Nita Sickle K, DO Taking Active Spouse/Significant Other, Pharmacy Records  sitaGLIPtin (JANUVIA) 100 MG tablet [id number] Yes Take 1 tablet (100 mg total) by mouth daily. Donato Schultz, DO Taking Active Spouse/Significant Other, Pharmacy Records              Assessment/Plan:   Diabetes: Currently uncontrolled - Reviewed goal A1c, goal fasting, and goal 2 hour post prandial glucose - continue Glucerna nutritional drink 3 times a day - Increase Tresiba 12 units once a daily for now. Will check back with family in 2 to 3 days and adjust depending on blood glucose reading and if she is using correction Humalog dose frequently.  Continue Januvia  100mg  daily. Consider SGLT2 in future but will wait to see if LFTs improve.  Continue to use Humalog / Novolg per sliding scale if needed.  - Reviewed s/s of hypoglycemia and how to treat.     Hyperlipidemia/ASCVD Risk Reduction: Currently controlled. But recent elevated LFTs.  Continue to hold pitavastatin.   Follow up in 2 to 3 days.    [facility] scheduled for tomorrow per daughter.   Henrene Pastor, PharmD Clinical Pharmacist [facility]

## 2023-04-15 NOTE — Transitions of Care (Post Inpatient/ED Visit) (Signed)
04/15/2023  Name: Victoria Gates MRN: [id number] DOB: 10-10-1950  Today's TOC FU Call Status: Today's TOC FU Call Status:: Successful TOC FU Call Competed TOC FU Call Complete Date: 04/15/23 (Call completed with both spouse and daughter.)  Transition Care Management Follow-up Telephone Call Date of Discharge: 04/13/23 Discharge Facility: [facility]) Type of Discharge: Inpatient Admission Primary Inpatient Discharge Diagnosis:: "hyperglycemia" How have you been since you were released from the hospital?: Better (Family report pt is "doing much better than in the hosptial-returning back to her normal self-talking,walking and eating." Blood sugar readings today 211(AM), 232(lunch)) Any questions or concerns?: No  Items Reviewed: Did you receive and understand the discharge instructions provided?: Yes Medications obtained,verified, and reconciled?: Yes (Medications Reviewed) Any new allergies since your discharge?: No Type of Diet Ordered:: card modified-pureed Do you have support at home?: Yes People in Home: spouse, child(ren), adult Name of Support/Comfort Primary Source: Kenneth-spouse and Latoya-daughter assisting pt as needed with care  Medications Reviewed Today: Medications Reviewed Today     Reviewed by Charlyn Minerva, RN (Registered Nurse) on 04/15/23 at 1440  Med List Status: <None>   Medication Order Taking? Sig Documenting Provider Last Dose Status Informant  docusate sodium (COLACE) 100 MG capsule [id number] Yes Take 100 mg by mouth daily. [provider] Taking Active Spouse/Significant Other, Pharmacy Records  feeding supplement, GLUCERNA SHAKE, (GLUCERNA SHAKE) LIQD [id number] Yes Take 237 mLs by mouth 3 (three) times daily between meals. Marguerita Merles [place], [place] Taking Active   glucose blood test strip [id number] Yes 1 each by Other route 4 (four) times daily. Dx: type 2 DM with long term insulin use E11.22; Z79.4 Zola Button, Grayling Congress, DO Taking  Active Spouse/Significant Other, Pharmacy Records  insulin degludec (TRESIBA FLEXTOUCH) 100 UNIT/ML FlexTouch Pen [id number] Yes Inject 12 Units into the skin daily. Seabron Spates R, DO Taking Active   insulin lispro (HUMALOG KWIKPEN) 100 UNIT/ML KwikPen [id number] Yes PER SLIDING SCALE----- NO MORE THAN 40 U / DAY  Patient taking differently: Inject 0-12 Units into the skin 3 (three) times daily as needed (Blood Sugar). PER SLIDING SCALE----- NO MORE THAN 40 U / DAY   Lowne Chase, Grayling Congress, DO Taking Active Spouse/Significant Other, Pharmacy Records  Insulin Pen Needle (NOVOFINE PEN NEEDLE) 32G X 6 MM MISC [id number] Yes As directed Donato Schultz, DO Taking Active Spouse/Significant Other, Pharmacy Records  lansoprazole (PREVACID) 30 MG capsule [id number] Yes Take 30 mg by mouth daily with breakfast.  [provider] Taking Active Spouse/Significant Other, Pharmacy Records           Med Note Chilton Si, STACY L   Wed May 01, 2016  8:45 AM)    mirtazapine (REMERON SOL-TAB) 30 MG disintegrating tablet [id number] Yes TAKE 1 TABLET BY MOUTH EVERYDAY AT BEDTIME Donato Schultz, DO Taking Active Spouse/Significant Other, Pharmacy Records  Multiple Vitamin (MULTIVITAMIN WITH MINERALS) TABS tablet [id number] Yes Take 1 tablet by mouth daily. Noralee Stain, DO Taking Active Spouse/Significant Other, Pharmacy Records           Med Note Epimenio Sarin, Carlota Raspberry Mar 24, 2023  2:27 PM)    ondansetron (ZOFRAN) 4 MG tablet [id number]  Take 1 tablet (4 mg total) by mouth every 6 (six) hours as needed for nausea. Marguerita Merles [place], [place]  Active   Pitavastatin Calcium 4 MG TABS [id number] No Take 1 tablet (4 mg total) by mouth daily.  Patient not taking: Reported on 04/14/2023  Marguerita Merles [place], DO Not Taking Active            Med Note Clydie Braun, Babette Relic B   Mon Apr 14, 2023 10:25 AM) On hold due to elevated LFTs  rivastigmine (EXELON) 1.5 MG capsule [id number] Yes Take 1 capsule (1.5 mg total) by  mouth 2 (two) times daily.  Patient taking differently: Take 1.5 mg by mouth 2 (two) times daily. 0600 and 1200   Nita Sickle K, DO Taking Active Spouse/Significant Other, Pharmacy Records  sitaGLIPtin (JANUVIA) 100 MG tablet [id number] Yes Take 1 tablet (100 mg total) by mouth daily. Donato Schultz, DO Taking Active Spouse/Significant Other, Pharmacy Records            Home Care and Equipment/Supplies: Were Home Health Services Ordered?: Yes [facility] of [facility] Agency[facility] Has Agency set up a time to come to your home?: Yes First Home Health Visit Date: 04/16/23 (Daughter confirms agency called today and coming out for initial visit tomorrow.) Any new equipment or medical supplies ordered?: No  Functional Questionnaire: Do you need assistance with bathing/showering or dressing?: Yes Do you need assistance with meal preparation?: Yes Do you need assistance with eating?: No Do you have difficulty maintaining continence: No Do you need assistance with getting out of bed/getting out of a chair/moving?: No Do you have difficulty managing or taking your medications?: Yes (family manages)  Follow up appointments reviewed: PCP Follow-up appointment confirmed?: Yes Date of PCP follow-up appointment?: 04/22/23 Follow-up Provider: Dr. Zola Button Specialist [facility] Follow-up appointment confirmed?: NA Do you need transportation to your follow-up appointment?: No (spouse confirms he is able to drive pt to appts) Do you understand care options if your condition(s) worsen?: Yes-patient verbalized understanding  SDOH Interventions Today    Flowsheet Row Most Recent Value  SDOH Interventions   Food Insecurity Interventions Intervention Not Indicated  Transportation Interventions Intervention Not Indicated      TOC Interventions Today    Flowsheet Row Most Recent Value  TOC Interventions   TOC Interventions Discussed/Reviewed TOC Interventions Discussed, Arranged PCP  follow up less than 12 days/Care Guide scheduled      Interventions Today    Flowsheet Row Most Recent Value  Chronic Disease   Chronic disease during today's visit Diabetes, Other  [dementia]  General Interventions   General Interventions Discussed/Reviewed General Interventions Discussed, Doctor Visits, Referral to Nurse, Durable Medical Equipment (DME)  [follow up appt has already been scheduled with assigned RN]  Doctor Visits Discussed/Reviewed Doctor Visits Discussed, PCP, Specialist  Durable Medical Equipment (DME) Glucomoter  [pt uses Accu-Check meter-family checking cbgs 4x/day-advised to take cbg log to MD appts]  Education Interventions   Education Provided Provided Education  Provided Verbal Education On When to see the doctor, Nutrition, Blood Sugar Monitoring, Medication, Other  Nutrition Interventions   Nutrition Discussed/Reviewed Nutrition Discussed, Adding fruits and vegetables, Increasing proteins, Carbohydrate meal planning, Decreasing sugar intake, Supplemental nutrition, Decreasing salt, Fluid intake, Decreasing fats  Pharmacy Interventions   Pharmacy Dicussed/Reviewed Pharmacy Topics Discussed, Medications and their functions  Safety Interventions   Safety Discussed/Reviewed Safety Discussed, Home Safety       Alessandra Grout [facility] Care Management Care Management Community Coordinator Direct Phone: [phone] Toll Free: [phone] Fax: [phone]

## 2023-04-16 DIAGNOSIS — E8729 Other acidosis: Secondary | ICD-10-CM | POA: Diagnosis not present

## 2023-04-16 DIAGNOSIS — E1165 Type 2 diabetes mellitus with hyperglycemia: Secondary | ICD-10-CM | POA: Diagnosis not present

## 2023-04-16 DIAGNOSIS — L89312 Pressure ulcer of right buttock, stage 2: Secondary | ICD-10-CM | POA: Diagnosis not present

## 2023-04-16 DIAGNOSIS — M5412 Radiculopathy, cervical region: Secondary | ICD-10-CM | POA: Diagnosis not present

## 2023-04-16 DIAGNOSIS — C9001 Multiple myeloma in remission: Secondary | ICD-10-CM | POA: Diagnosis not present

## 2023-04-16 DIAGNOSIS — G9341 Metabolic encephalopathy: Secondary | ICD-10-CM | POA: Diagnosis not present

## 2023-04-16 DIAGNOSIS — E870 Hyperosmolality and hypernatremia: Secondary | ICD-10-CM | POA: Diagnosis not present

## 2023-04-16 DIAGNOSIS — E860 Dehydration: Secondary | ICD-10-CM | POA: Diagnosis not present

## 2023-04-16 DIAGNOSIS — F03C0 Unspecified dementia, severe, without behavioral disturbance, psychotic disturbance, mood disturbance, and anxiety: Secondary | ICD-10-CM | POA: Diagnosis not present

## 2023-04-17 ENCOUNTER — Other Ambulatory Visit: Payer: Medicare HMO | Admitting: Pharmacist

## 2023-04-17 ENCOUNTER — Telehealth: Payer: Self-pay | Admitting: Family Medicine

## 2023-04-17 DIAGNOSIS — G9341 Metabolic encephalopathy: Secondary | ICD-10-CM | POA: Diagnosis not present

## 2023-04-17 DIAGNOSIS — E1165 Type 2 diabetes mellitus with hyperglycemia: Secondary | ICD-10-CM | POA: Diagnosis not present

## 2023-04-17 DIAGNOSIS — E860 Dehydration: Secondary | ICD-10-CM | POA: Diagnosis not present

## 2023-04-17 DIAGNOSIS — E870 Hyperosmolality and hypernatremia: Secondary | ICD-10-CM | POA: Diagnosis not present

## 2023-04-17 DIAGNOSIS — E8729 Other acidosis: Secondary | ICD-10-CM | POA: Diagnosis not present

## 2023-04-17 DIAGNOSIS — L89312 Pressure ulcer of right buttock, stage 2: Secondary | ICD-10-CM | POA: Diagnosis not present

## 2023-04-17 DIAGNOSIS — M5412 Radiculopathy, cervical region: Secondary | ICD-10-CM | POA: Diagnosis not present

## 2023-04-17 DIAGNOSIS — F03C0 Unspecified dementia, severe, without behavioral disturbance, psychotic disturbance, mood disturbance, and anxiety: Secondary | ICD-10-CM | POA: Diagnosis not present

## 2023-04-17 DIAGNOSIS — C9001 Multiple myeloma in remission: Secondary | ICD-10-CM | POA: Diagnosis not present

## 2023-04-17 MED ORDER — TRESIBA FLEXTOUCH 100 UNIT/ML ~~LOC~~ SOPN: 14.0000 [IU] | PEN_INJECTOR | Freq: Every day | SUBCUTANEOUS | Status: DC

## 2023-04-17 NOTE — Telephone Encounter (Signed)
Verbal given 

## 2023-04-17 NOTE — Progress Notes (Signed)
04/17/2023 Name: Victoria Gates MRN: [id number] DOB: 10-01-1950  Chief Complaint  Patient presents with   Diabetes   Medication Management    Victoria Gates is a 72 y.o. year old female who presented for a telephone visit.   They were referred to the pharmacist by their PCP for assistance in managing diabetes.  Spoke with patient's husband day. Patient has dementia and family administers medications.    Subjective:  Care Team: Primary Care Provider: Zola Button, Grayling Congress, DO ; Next Scheduled Visit: not currently scheduled Neurologist: Dr Allena Katz; Next Scheduled Visit: 05/12/2023 Hematology / Oncology - Dr Ivonne Andrew; Next Scheduled Visit: 09/23/2023 Patient has been referred back to endocrinology but referral has not been completed yet.   Patient presented to ED 04/10/2023 - admitted to hospital 04/10/2023 to 04/13/20[facility] for hyperglycemia, dehydration and elevated LFTs.  Blood glucose on arrival in ED was 387 on point of care check but was 460 on BMET checked around the same time. Patient given 5 units of insulin aspart and transferred to inpatient.  Blood glucose 5 hours later was 236 and then 12 hours later 252.  On 04/11/2023 was 179 >> 129  >> 64 >> 124 >> 250.  On 04/12/2023 was 203 >> 331 >> 322 >> 408 (6 units)  >> 328.  On 04/13/2023 was 159 >> 286 Also note elevated LFTs and ARI on arrival.  eFGR was 38 on admission but improved to > 60 by discharge.   04/12/2023 Abdominal ultrasound due to elevated LFTs IMPRESSION: 1. Very limited examination. Liver appears to be heterogeneous. No definite biliary dilatation.2. Probable small decompressed gallbladder but limited evaluation. Medication Changes at discharge:  Started ondansetron 4mg  every 6 hours as needed for nausea.  Glucerna changed to drink 3 times a day between meals   Current medications: Januvia 100mg  daily, Tresiba 10 units daily - verified Rx was picked up 04/07/2023 at [facility].  Novolog sliding  scale (blood glucose 200 to 250 - give 2 units; 251 to 300 - give 4 units; 301 to 350 - give 6 units 351 to 400 give 8 units, if blood glucose > 400 take 10 units and call physician)  Medications tried in the past: metformin - tried 07/2022 - stopped due to nausea and vomiting.  Current glucose readings - checking 3 to 4 times a day: 7/14 (discharged from hospital around 1pm) at 4pm was 180; at 10pm was 263 (gave 4 units of Humalog) 07/15 - 8am was 167 (gave 10 units of Tresiba and took [place] 100mg  daily); noon - 226 (2 units of Humalog); 5pm - 226 (gave 2 units of Humalog); 10pm - 275 (gave 4 units of Humalog) 07/16 - 7am was 211 (gave Tresiba 10 units, Januvia and 2 units of Humalog); 11:30am was 232 (will give 2 units when patient eats lunch) 07/17 - am was 222 (gave Tresiba 12 units, Januvia and 2 units of Humalog): noon was 228 (gave 2 units of Humalog), 6pm was 218 (gave 2 units of Humalog) 07/18 - morning was 194 (gave Tresiba 12 units and Januvia); noon was 184  Home Health also checked blood glucose when they visited today and blood glucose was 174  Patient denies hypoglycemic s/sx including no dizziness, shakiness, sweating. 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  Patient denies hyperglycemic symptoms including no polyuria, polydipsia, polyphagia, nocturia, neuropathy, blurred vision.  Current meal patterns per daughter;  Patient is eating better. Eating 3 meals per day and drinking Glucerna shakes  Husband states he has been putting water in empty Glucerna bottle and patient will drink water more readily this way.  Hyperlipidemia/ASCVD Risk Reduction  Current lipid lowering medications: pitavastatin 4mg  daily - currently on hold due to elevated LFTs.  Patient has been taking Livalo / pitavastatin since 2020. LFTs have been WNL in past.   Medications tried in the  past: atorvastatin and rosuvastatin - both caused myalgias  Antiplatelet regimen: none   Objective:  Lab Results  Component Value Date   HGBA1C 6.8 (H) 02/03/2023    Lab Results  Component Value Date   CREATININE 0.88 04/13/2023   BUN 15 04/13/2023   NA 142 04/13/2023   K 3.4 (L) 04/13/2023   CL 112 (H) 04/13/2023   CO2 23 04/13/2023    Lab Results  Component Value Date   CHOL 171 02/03/2023   HDL 36.10 (L) 02/03/2023   LDLCALC 103 (H) 08/12/2022   LDLDIRECT 106.0 02/03/2023   TRIG 287.0 (H) 02/03/2023   CHOLHDL 5 02/03/2023    Medications Reviewed Today     Reviewed by Henrene Pastor, RPH-CPP (Pharmacist) on 04/17/23 at 1419  Med List Status: <None>   Medication Order Taking? Sig Documenting Provider Last Dose Status Informant  docusate sodium (COLACE) 100 MG capsule [id number] Yes Take 100 mg by mouth daily. [provider] Taking Active Spouse/Significant Other, Pharmacy Records  feeding supplement, GLUCERNA SHAKE, (GLUCERNA SHAKE) LIQD [id number] Yes Take 237 mLs by mouth 3 (three) times daily between meals. Marguerita Merles [place], [place] Taking Active   glucose blood test strip [id number] Yes 1 each by Other route 4 (four) times daily. Dx: type 2 DM with long term insulin use E11.22; Z79.4 Zola Button, Grayling Congress, DO Taking Active Spouse/Significant Other, Pharmacy Records  insulin degludec (TRESIBA FLEXTOUCH) 100 UNIT/ML FlexTouch Pen [id number] Yes Inject 12 Units into the skin daily. Seabron Spates R, DO Taking Active   insulin lispro (HUMALOG KWIKPEN) 100 UNIT/ML KwikPen [id number] Yes PER SLIDING SCALE----- NO MORE THAN 40 U / DAY  Patient taking differently: Inject 0-12 Units into the skin 3 (three) times daily as needed (Blood Sugar). PER SLIDING SCALE----- NO MORE THAN 40 U / DAY   Lowne Chase, Grayling Congress, DO Taking Active Spouse/Significant Other, Pharmacy Records  Insulin Pen Needle (NOVOFINE PEN NEEDLE) 32G X 6 MM MISC [id number] Yes As directed Donato Schultz, DO Taking Active Spouse/Significant Other, Pharmacy Records  lansoprazole (PREVACID) 30 MG capsule [id number]  Take 30 mg by mouth daily with breakfast.  [provider]  Active Spouse/Significant Other, Pharmacy Records           Med Note Chilton Si, STACY L   Wed May 01, 2016  8:45 AM)    mirtazapine (REMERON SOL-TAB) 30 MG disintegrating tablet [id number] Yes TAKE 1 TABLET BY MOUTH EVERYDAY AT BEDTIME Donato Schultz, DO Taking Active Spouse/Significant Other, Pharmacy Records  Multiple Vitamin (MULTIVITAMIN WITH MINERALS) TABS tablet [id number]  Take 1 tablet by mouth daily. Noralee Stain, DO  Active Spouse/Significant Other, Pharmacy Records           Med Note Epimenio Sarin, Carlota Raspberry Mar 24, 2023  2:27 PM)    ondansetron (ZOFRAN) 4 MG tablet [id number]  Take 1 tablet (4 mg total) by mouth every 6 (six) hours as needed for nausea. Marguerita Merles [place], [place]  Active  Pitavastatin Calcium 4 MG TABS [id number] No Take 1 tablet (4 mg total) by mouth daily.  Patient not taking: Reported on 04/14/2023   Merlene Laughter, DO Not Taking Active            Med Note Clydie Braun, Inda Merlin Apr 14, 2023 10:25 AM) On hold due to elevated LFTs  rivastigmine (EXELON) 1.5 MG capsule [id number] Yes Take 1 capsule (1.5 mg total) by mouth 2 (two) times daily.  Patient taking differently: Take 1.5 mg by mouth 2 (two) times daily. 0600 and 1200   Nita Sickle K, DO Taking Active Spouse/Significant Other, Pharmacy Records  sitaGLIPtin (JANUVIA) 100 MG tablet [id number] Yes Take 1 tablet (100 mg total) by mouth daily. Donato Schultz, DO Taking Active Spouse/Significant Other, Pharmacy Records              Assessment/Plan:   Diabetes: Currently uncontrolled - Reviewed goal A1c, goal fasting, and goal 2 hour post prandial glucose - continue Glucerna nutritional drink 3 times a day - Increase Tresiba 14 units once a daily for now. Will check back with family in 5 to 7 days and adjust  depending on blood glucose reading and if she is using correction Humalog dose frequently.  - Continue Januvia 100mg  daily. - Consider SGLT2 in future but will wait to see if LFTs improve.  - Continue to use Humalog / Novolg per sliding scale if needed.     Hyperlipidemia/ASCVD Risk Reduction: Currently controlled. But recent elevated LFTs.  Continue to hold pitavastatin.   Follow up in 5 to 7 days   [facility] will be seeing patient for the next few weeks for nursing and wound care.   Henrene Pastor, PharmD Clinical Pharmacist [facility]

## 2023-04-17 NOTE — Telephone Encounter (Signed)
Caller/Agency: [facility]) Callback Number: [phone], ok to [facility] Requesting OT/PT/Skilled Nursing/Social Work/Speech Therapy: Skilled Nursing Frequency: 2 w 2, 1 w 2 for Diabetes and Wound Care  Xerofoam changed twice weekly and as needed

## 2023-04-17 NOTE — Telephone Encounter (Signed)
[name] [name]) called with the following info for PCP:   Pt was in hospital and per PT evaluation, PT is not needed at this time. Skilled nursing is currently seeing her. Husband denies need for social worker for community services. Pt has severe dementia. Pt Glucose level checked today (7.18.24) was 174. Pt has been in the hospital 3 times in the last couple weeks for blood sugar issues.

## 2023-04-18 DIAGNOSIS — E1165 Type 2 diabetes mellitus with hyperglycemia: Secondary | ICD-10-CM | POA: Diagnosis not present

## 2023-04-18 DIAGNOSIS — C9001 Multiple myeloma in remission: Secondary | ICD-10-CM | POA: Diagnosis not present

## 2023-04-18 DIAGNOSIS — E870 Hyperosmolality and hypernatremia: Secondary | ICD-10-CM | POA: Diagnosis not present

## 2023-04-18 DIAGNOSIS — E860 Dehydration: Secondary | ICD-10-CM | POA: Diagnosis not present

## 2023-04-18 DIAGNOSIS — M5412 Radiculopathy, cervical region: Secondary | ICD-10-CM | POA: Diagnosis not present

## 2023-04-18 DIAGNOSIS — E8729 Other acidosis: Secondary | ICD-10-CM | POA: Diagnosis not present

## 2023-04-18 DIAGNOSIS — G9341 Metabolic encephalopathy: Secondary | ICD-10-CM | POA: Diagnosis not present

## 2023-04-18 DIAGNOSIS — F03C0 Unspecified dementia, severe, without behavioral disturbance, psychotic disturbance, mood disturbance, and anxiety: Secondary | ICD-10-CM | POA: Diagnosis not present

## 2023-04-18 DIAGNOSIS — L89312 Pressure ulcer of right buttock, stage 2: Secondary | ICD-10-CM | POA: Diagnosis not present

## 2023-04-21 DIAGNOSIS — F03C0 Unspecified dementia, severe, without behavioral disturbance, psychotic disturbance, mood disturbance, and anxiety: Secondary | ICD-10-CM | POA: Diagnosis not present

## 2023-04-21 DIAGNOSIS — C9001 Multiple myeloma in remission: Secondary | ICD-10-CM | POA: Diagnosis not present

## 2023-04-21 DIAGNOSIS — E870 Hyperosmolality and hypernatremia: Secondary | ICD-10-CM | POA: Diagnosis not present

## 2023-04-21 DIAGNOSIS — E860 Dehydration: Secondary | ICD-10-CM | POA: Diagnosis not present

## 2023-04-21 DIAGNOSIS — L89312 Pressure ulcer of right buttock, stage 2: Secondary | ICD-10-CM | POA: Diagnosis not present

## 2023-04-21 DIAGNOSIS — G9341 Metabolic encephalopathy: Secondary | ICD-10-CM | POA: Diagnosis not present

## 2023-04-21 DIAGNOSIS — E1165 Type 2 diabetes mellitus with hyperglycemia: Secondary | ICD-10-CM | POA: Diagnosis not present

## 2023-04-21 DIAGNOSIS — E8729 Other acidosis: Secondary | ICD-10-CM | POA: Diagnosis not present

## 2023-04-21 DIAGNOSIS — M5412 Radiculopathy, cervical region: Secondary | ICD-10-CM | POA: Diagnosis not present

## 2023-04-22 ENCOUNTER — Inpatient Hospital Stay: Payer: Medicare HMO | Admitting: Family Medicine

## 2023-04-23 ENCOUNTER — Ambulatory Visit (INDEPENDENT_AMBULATORY_CARE_PROVIDER_SITE_OTHER): Payer: Medicare HMO | Admitting: Pharmacist

## 2023-04-23 DIAGNOSIS — E1122 Type 2 diabetes mellitus with diabetic chronic kidney disease: Secondary | ICD-10-CM

## 2023-04-23 DIAGNOSIS — Z794 Long term (current) use of insulin: Secondary | ICD-10-CM

## 2023-04-23 DIAGNOSIS — E1169 Type 2 diabetes mellitus with other specified complication: Secondary | ICD-10-CM

## 2023-04-23 DIAGNOSIS — R7989 Other specified abnormal findings of blood chemistry: Secondary | ICD-10-CM

## 2023-04-23 DIAGNOSIS — N1831 Chronic kidney disease, stage 3a: Secondary | ICD-10-CM

## 2023-04-23 DIAGNOSIS — E785 Hyperlipidemia, unspecified: Secondary | ICD-10-CM

## 2023-04-23 NOTE — Progress Notes (Signed)
04/23/2023 Name: Victoria Gates MRN: [id number] DOB: [birth year]  No chief complaint on file.   Victoria Gates is a 72 y.o. year old female who presented for a telephone visit.   They were referred to the pharmacist by their PCP for assistance in managing diabetes.  Spoke with patient's husband day. Patient has dementia and family administers medications.    Subjective:  Care Team: Primary Care Provider: Zola Button, Grayling Congress, DO ; Next Scheduled Visit: not currently scheduled Neurologist: Dr Allena Katz; Next Scheduled Visit: 05/12/2023 Hematology / Oncology - Dr Ivonne Andrew; Next Scheduled Visit: 09/23/2023 Patient has been referred back to endocrinology but referral has not been completed yet.   Patient presented to ED 04/10/2023 - admitted to hospital 04/10/2023 to 04/13/2023 [facility] for hyperglycemia, dehydration and elevated LFTs.  Blood glucose on arrival in ED was 387 on point of care check but was 460 on BMET checked around the same time. Patient given 5 units of insulin aspart and transferred to inpatient.  Blood glucose 5 hours later was 236 and then 12 hours later 252.  On 04/11/2023 was 179 >> 129  >> 64 >> 124 >> 250.  On 04/12/2023 was 203 >> 331 >> 322 >> 408 (6 units)  >> 328.  On 04/13/2023 was 159 >> 286 Also note elevated LFTs and ARI on arrival.  eFGR was 38 on admission but improved to > 60 by discharge.   04/12/2023 Abdominal ultrasound due to elevated LFTs IMPRESSION: 1. Very limited examination. Liver appears to be heterogeneous. No definite biliary dilatation.2. Probable small decompressed gallbladder but limited evaluation. Medication Changes at discharge:  Started ondansetron 4mg  every 6 hours as needed for nausea.  Glucerna changed to drink 3 times a day between meals   Current medications: Januvia 100mg  daily, Tresiba 14 units daily -   Novolog sliding scale (blood glucose 200 to 250 - give 2 units; 251 to 300 - give 4 units; 301 to 350 - give  6 units 351 to 400 give 8 units, if blood glucose > 400 take 10 units and call physician); Per Mr. Dyk he has not given supplemental / correction dose of Novolog since 04/17/2023  Medications tried in the past: metformin - tried 07/2022 - stopped due to nausea and vomiting.  Current glucose readings - checking 3 to 4 times a day: 7/14 (discharged from hospital around 1pm) at 4pm was 180; at 10pm was 263 (gave 4 units of Humalog) 07/15 - 8am was 167 (gave 10 units of Tresiba and took Januvia 100mg  daily); noon - 226 (2 units of Humalog); 5pm - 226 (gave 2 units of Humalog); 10pm - 275 (gave 4 units of Humalog) 07/16 - 7am was 211 (gave Tresiba 10 units, Januvia and 2 units of Humalog); 11:30am was 232 (will give 2 units when patient eats lunch) 07/17 - am was 222 (gave Tresiba 12 units, Januvia and 2 units of Humalog): noon was 228 (gave 2 units of Humalog), 6pm was 218 (gave 2 units of Humalog) 07/18 - morning was 194 (gave Tresiba 12 units and Januvia); noon was 184 - increased Tresiba to 14 units daily  04/22/2023 am was 153, noon was 109, 6pm was 128 and bedtime 132 04/23/2023 am was 123, noon was 104  Patient denies hypoglycemic s/sx including no dizziness, shakiness, sweating. 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  Patient denies hyperglycemic  symptoms including no polyuria, polydipsia, polyphagia, nocturia, neuropathy, blurred vision.  Current meal patterns  Patient is eating better. Eating 3 meals per day and drinking Glucerna shakes  Husband states he has been putting water in empty Glucerna bottle and patient will drink water more readily this way.  Hyperlipidemia/ASCVD Risk Reduction  Current lipid lowering medications: pitavastatin 4mg  daily - currently on hold due to elevated LFTs.  Patient has been taking Livalo / pitavastatin since 2020. LFTs have been WNL in  past.   Medications tried in the past: atorvastatin and rosuvastatin - both caused myalgias  Antiplatelet regimen: none   Objective:  Lab Results  Component Value Date   HGBA1C 6.8 (H) 02/03/2023    Lab Results  Component Value Date   CREATININE 0.88 04/13/2023   BUN 15 04/13/2023   NA 142 04/13/2023   K 3.4 (L) 04/13/2023   CL 112 (H) 04/13/2023   CO2 23 04/13/2023    Lab Results  Component Value Date   CHOL 171 02/03/2023   HDL 36.10 (L) 02/03/2023   LDLCALC 103 (H) 08/12/2022   LDLDIRECT 106.0 02/03/2023   TRIG 287.0 (H) 02/03/2023   CHOLHDL 5 02/03/2023    Medications Reviewed Today   Medications were not reviewed in this encounter       Assessment/Plan:   Diabetes: blood glucose has improved since starting Lantus - Reviewed goal A1c, goal fasting, and goal 2 hour post prandial glucose - continue Glucerna nutritional drink 3 times a day - Since blood glucose has been stable - Ok to decrease to checking 2 times per day - morning and vary other time of day. - Continue Tresiba 14 units once a daily - Continue Januvia 100mg  daily. - Consider SGLT2 in future but will wait to see if LFTs improve.  - Continue to use Humalog / Novolg per sliding scale if needed.    Hyperlipidemia/ASCVD Risk Reduction: Currently controlled. But recent elevated LFTs.  Continue to hold pitavastatin.  Seeing PCP tomorrow - consider rechecking LFTs  Follow up tomorrow with PCP Clinical Pharmacist Practitioner will check back in 2 to 4 weeks.   Henrene Pastor, PharmD Clinical Pharmacist [facility]

## 2023-04-24 ENCOUNTER — Encounter: Payer: Self-pay | Admitting: Family Medicine

## 2023-04-24 ENCOUNTER — Ambulatory Visit (INDEPENDENT_AMBULATORY_CARE_PROVIDER_SITE_OTHER): Payer: Medicare HMO | Admitting: Family Medicine

## 2023-04-24 VITALS — BP 124/70 | Resp 18 | Ht 68.0 in | Wt 186.0 lb

## 2023-04-24 DIAGNOSIS — E1122 Type 2 diabetes mellitus with diabetic chronic kidney disease: Secondary | ICD-10-CM

## 2023-04-24 DIAGNOSIS — N1831 Chronic kidney disease, stage 3a: Secondary | ICD-10-CM | POA: Diagnosis not present

## 2023-04-24 DIAGNOSIS — C9001 Multiple myeloma in remission: Secondary | ICD-10-CM

## 2023-04-24 DIAGNOSIS — E1169 Type 2 diabetes mellitus with other specified complication: Secondary | ICD-10-CM | POA: Diagnosis not present

## 2023-04-24 DIAGNOSIS — E785 Hyperlipidemia, unspecified: Secondary | ICD-10-CM | POA: Diagnosis not present

## 2023-04-24 DIAGNOSIS — E870 Hyperosmolality and hypernatremia: Secondary | ICD-10-CM | POA: Diagnosis not present

## 2023-04-24 DIAGNOSIS — Z794 Long term (current) use of insulin: Secondary | ICD-10-CM

## 2023-04-24 DIAGNOSIS — L89312 Pressure ulcer of right buttock, stage 2: Secondary | ICD-10-CM | POA: Diagnosis not present

## 2023-04-24 DIAGNOSIS — G9341 Metabolic encephalopathy: Secondary | ICD-10-CM | POA: Diagnosis not present

## 2023-04-24 DIAGNOSIS — M5412 Radiculopathy, cervical region: Secondary | ICD-10-CM | POA: Diagnosis not present

## 2023-04-24 DIAGNOSIS — E860 Dehydration: Secondary | ICD-10-CM | POA: Diagnosis not present

## 2023-04-24 DIAGNOSIS — G3109 Other frontotemporal dementia: Secondary | ICD-10-CM | POA: Diagnosis not present

## 2023-04-24 DIAGNOSIS — E1165 Type 2 diabetes mellitus with hyperglycemia: Secondary | ICD-10-CM | POA: Diagnosis not present

## 2023-04-24 DIAGNOSIS — F0280 Dementia in other diseases classified elsewhere without behavioral disturbance: Secondary | ICD-10-CM

## 2023-04-24 DIAGNOSIS — I10 Essential (primary) hypertension: Secondary | ICD-10-CM | POA: Diagnosis not present

## 2023-04-24 DIAGNOSIS — E8729 Other acidosis: Secondary | ICD-10-CM | POA: Diagnosis not present

## 2023-04-24 DIAGNOSIS — F03C0 Unspecified dementia, severe, without behavioral disturbance, psychotic disturbance, mood disturbance, and anxiety: Secondary | ICD-10-CM | POA: Diagnosis not present

## 2023-04-24 LAB — PHOSPHORUS: Phosphorus: 3.7 mg/dL (ref 2.3–4.6)

## 2023-04-24 LAB — BASIC METABOLIC PANEL
BUN: 11 mg/dL (ref 6–23)
CO2: 26 mEq/L (ref 19–32)
Calcium: 9.3 mg/dL (ref 8.4–10.5)
Chloride: 100 mEq/L (ref 96–112)
Creatinine, Ser: 0.93 mg/dL (ref 0.40–1.20)
GFR: 61.41 mL/min (ref >60.00)
Glucose, Bld: 91 mg/dL (ref 70–99)
Potassium: 4 mEq/L (ref 3.5–5.1)
Sodium: 135 mEq/L (ref 135–145)

## 2023-04-24 LAB — CBC WITH DIFFERENTIAL/PLATELET
Basophils Absolute: 0 10*3/uL (ref 0.0–0.1)
Basophils Relative: 0.5 % (ref 0.0–3.0)
Eosinophils Absolute: 0.1 10*3/uL (ref 0.0–0.7)
Eosinophils Relative: 1.3 % (ref 0.0–5.0)
HCT: 39.6 % (ref 36.0–46.0)
Hemoglobin: 12.8 g/dL (ref 12.0–15.0)
Lymphocytes Relative: 38 % (ref 12.0–46.0)
Lymphs Abs: 2.1 10*3/uL (ref 0.7–4.0)
MCHC: 32.2 g/dL (ref 30.0–36.0)
MCV: 85.3 fl (ref 78.0–100.0)
Monocytes Absolute: 0.5 10*3/uL (ref 0.1–1.0)
Monocytes Relative: 8.2 % (ref 3.0–12.0)
Neutro Abs: 2.9 10*3/uL (ref 1.4–7.7)
Neutrophils Relative %: 52 % (ref 43.0–77.0)
Platelets: 250 10*3/uL (ref 150.0–400.0)
RBC: 4.65 Mil/uL (ref 3.87–5.11)
RDW: 14.1 % (ref 11.5–15.5)
WBC: 5.5 10*3/uL (ref 4.0–10.5)

## 2023-04-24 LAB — MAGNESIUM: Magnesium: 1.9 mg/dL (ref 1.5–2.5)

## 2023-04-24 MED ORDER — NOVOFINE PEN NEEDLE 32G X 6 MM MISC
1 refills | Status: DC
Start: 2023-04-24 — End: 2023-05-28

## 2023-04-24 NOTE — Assessment & Plan Note (Signed)
Per neuro 

## 2023-04-24 NOTE — Assessment & Plan Note (Signed)
Check labs today.

## 2023-04-24 NOTE — Assessment & Plan Note (Signed)
hgba1c acceptable, minimize simple carbs. Increase exercise as tolerated. Continue current meds    Lab Results  Component Value Date   HGBA1C 6.8 (H) 02/03/2023

## 2023-04-24 NOTE — Patient Instructions (Signed)

## 2023-04-24 NOTE — Assessment & Plan Note (Signed)
Encourage heart healthy diet such as MIND or DASH diet, increase exercise, avoid trans fats, simple carbohydrates and processed foods, consider a krill or fish or flaxseed oil cap daily.  °

## 2023-04-24 NOTE — Progress Notes (Signed)
Established Patient Office Visit  Subjective   Patient ID: Victoria Gates, female    DOB: 15-Sep-1951  Age: 72 y.o. MRN: [id number]  Chief Complaint  Patient presents with   Hospitalization Follow-up    HPI Discussed the use of AI scribe software for clinical note transcription with the patient, who gave verbal consent to proceed.  History of Present Illness   The patient, with a history of diabetes, has been managing her blood glucose levels at home with [place] and Januvia. The caregiver reports that the patient's blood glucose levels have been improving since her recent hospital discharge. The patient's blood glucose levels have been decreasing daily, with the lowest reading being 94. The caregiver also reports that the patient's appetite has improved, and she is now eating regular food and drinking Glucerna. The patient also drinks water, which the caregiver ensures by refilling the Glucerna bottle with water. The patient is also receiving home health care, which includes vital sign checks and blood sugar monitoring.   She also has a hx of frontal lobe dementia   Patient Active Problem List   Diagnosis Date Noted   Palliative care by specialist 04/11/2023   Hyperglycemia 04/11/2023   Severe dementia without behavioral disturbance, psychotic disturbance, mood disturbance, or anxiety (HCC) 04/11/2023   Uncontrolled type 2 diabetes mellitus with hyperglycemia (HCC) 02/03/2023   Pressure injury of buttock, stage 2 (HCC) 08/10/2022   Pressure injury of skin 08/08/2022   Chronic liver failure (HCC) 08/07/2022   Anemia 08/07/2022   Malnutrition of moderate degree 08/03/2022   AKI (acute kidney injury) (HCC) 08/03/2022   Acute metabolic encephalopathy 08/03/2022   Transaminitis 08/03/2022   Hypernatremia 08/02/2022   Altered mental status 08/02/2022   Lactic acidosis 08/02/2022   DKA (diabetic ketoacidosis) (HCC) 07/31/2022   Mild protein-calorie malnutrition (HCC) 01/14/2022    Preventative health care 07/12/2021   Hyperlipidemia associated with type 2 diabetes mellitus (HCC) 07/12/2021   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HCC) 04/03/2021   Pre-op examination 04/03/2021   Hyperlipidemia 07/03/2020   Mild neurocognitive disorder, 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   Bradycardia 04/29/2016   Hypokalemia 11/30/2015   Neutropenia (HCC) 05/03/2015   Syncope 09/11/2014   Bronchitis 09/08/2014   Multiple myeloma (HCC) 10/22/2013   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   Hyperproteinemia 04/14/2013   Midsternal chest pain 04/20/2012   Lipoma of shoulder 11/02/2011   Herpes zoster 11/05/2010   GERD (gastroesophageal reflux disease) 09/21/2010   Cervical Radiculopathy, Left 02/01/2010   Hypercholesterolemia 07/03/2009   Otitis Externa 07/03/2009   Cerumen impaction 07/03/2009   Myalgia 07/03/2009   Allergic rhinitis 01/02/2009   Type 2 diabetes mellitus treated with insulin (HCC) 03/18/2008   Chest pain 12/24/2007   URI 12/12/2007   Anemia-Iron Deficiency 04/24/2007   Essential hypertension 04/24/2007   Osteoporosis 04/24/2007   Esophageal Stricture 04/15/2007   Hiatal Hernia 04/15/2007   Past Medical History:  Diagnosis Date   Allergic rhinitis 01/02/2009   Bradycardia 04/29/2016   Cervical Radiculopathy 02/01/2010   Left side   Chest pain 04/15/2012   Ex MV: Ex time 10 mins, EF 63%, no ischemia/infarct. Occas PAC's/PVC's.   Dementia (HCC)    Diabetes mellitus, type II 03/18/2008   Encounter for antineoplastic chemotherapy 04/29/2016   Esophageal Stricture 04/15/2007   s/p dilitation   GERD (gastroesophageal reflux disease) 09/21/2010   Herpes zoster 11/05/2010   Hiatal Hernia 04/15/2007   History of colonic polyps 04/24/2007   Hyperplastic only   Hypercholesterolemia 07/03/2009  Hypokalemia 11/30/2015   Iron deficiency anemia 04/24/2007   Multiple myeloma (HCC) 02/2014   Osteoporosis 04/24/2007   Past Surgical History:   Procedure Laterality Date   ESOPHAGOGASTRODUODENOSCOPY  04/15/2007   TUBAL LIGATION     Social History   Tobacco Use   Smoking status: Never   Smokeless tobacco: Never  Vaping Use   Vaping status: Never Used  Substance Use Topics   Alcohol use: No   Drug use: No   Social History   Socioeconomic History   Marital status: Married    Spouse name: Not on file   Number of children: Not on file   Years of education: Not on file   Highest education level: Associate degree: academic program  Occupational History   Occupation: Scientist, research (physical sciences): [address]  Tobacco Use   Smoking status: Never   Smokeless tobacco: Never  Vaping Use   Vaping status: Never Used  Substance and Sexual Activity   Alcohol use: No   Drug use: No   Sexual activity: Not Currently  Other Topics Concern   Not on file  Social History Narrative   Lives in [place] with spouse.   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   Right Handed   Social Determinants of Health   Financial Resource Strain: Low Risk  (02/03/2023)   Overall Financial Resource Strain (CARDIA)    Difficulty of Paying Living Expenses: Not hard at all  Food Insecurity: No Food Insecurity (04/15/2023)   Hunger Vital Sign    Worried About Running Out of Food in the Last Year: Never true    Ran Out of Food in the Last Year: Never true  Transportation Needs: No Transportation Needs (04/15/2023)   PRAPARE - Administrator, Civil Service (Medical): No    Lack of Transportation (Non-Medical): No  Physical Activity: Insufficiently Active (02/03/2023)   Exercise Vital Sign    Days of Exercise per Week: 1 day    Minutes of Exercise per Session: 30 min  Stress: No Stress Concern Present (02/03/2023)   [employer] of Occupational Health - Occupational Stress Questionnaire    Feeling of Stress : Not at all  Social Connections: Moderately Isolated (02/03/2023)   Social Connection and Isolation Panel [NHANES]     Frequency of Communication with Friends and Family: Once a week    Frequency of Social Gatherings with Friends and Family: Once a week    Attends Religious Services: 1 to 4 times per year    Active Member of [employer] or Organizations: No    Attends Engineer, structural: Not on file    Marital Status: Married  Intimate Partner Violence: Patient Unable To Answer (04/10/2023)   Humiliation, Afraid, Rape, and Kick questionnaire    Fear of Current or Ex-Partner: Patient unable to answer    Emotionally Abused: Patient unable to answer    Physically Abused: Patient unable to answer    Sexually Abused: Patient unable to answer   Family Status  Relation Name Status   Mother  Deceased       mother had "brain tumor"   Father  Deceased   Sister  Alive       major depression   Sister  Alive   Sister  Deceased at age 57       als   Sister  Deceased at age 56       depression protein cal malnutrition heart attack   Brother  (  Not Specified)   Neg Hx  (Not Specified)  No partnership data on file   Family History  Problem Relation Age of Onset   Brain cancer Mother    Heart disease Mother    Lung cancer Father    Heart disease Father    Diabetes Sister    Heart disease Sister    Heart attack Sister    ALS Sister    Diabetes Brother    Heart disease Brother    Cancer Neg Hx        No FH of Colon Cancer   Colon cancer Neg Hx    Esophageal cancer Neg Hx    Rectal cancer Neg Hx    Stomach cancer Neg Hx    Allergies  Allergen Reactions   Sulfonamide Derivatives Other (See Comments)    Unknown reaction    Atorvastatin Other (See Comments)    myalgias   Rosuvastatin Other (See Comments)    myalgias      Review of Systems  Constitutional:  Negative for fever and malaise/fatigue.  HENT:  Negative for congestion.   Eyes:  Negative for blurred vision.  Respiratory:  Negative for cough and shortness of breath.   Cardiovascular:  Negative for chest pain, palpitations and leg  swelling.  Gastrointestinal:  Negative for abdominal pain, blood in stool, nausea and vomiting.  Genitourinary:  Negative for dysuria and frequency.  Musculoskeletal:  Negative for back pain and falls.  Skin:  Negative for rash.  Neurological:  Negative for dizziness, loss of consciousness and headaches.  Endo/Heme/Allergies:  Negative for environmental allergies.  Psychiatric/Behavioral:  Negative for depression. The patient is not nervous/anxious.       Objective:     BP 124/70 Comment: per home health  Resp 18   Ht 5\' 8"  (1.727 m)   Wt 186 lb (84.4 kg) Comment: pt refused to take of purse  BMI 28.28 kg/m     Physical Exam Vitals and nursing note reviewed.  Constitutional:      General: She is not in acute distress.    Appearance: Normal appearance. She is well-developed.  HENT:     Head: Normocephalic and atraumatic.  Eyes:     General: No scleral icterus.       Right eye: No discharge.        Left eye: No discharge.  Cardiovascular:     Rate and Rhythm: Normal rate and regular rhythm.     Heart sounds: No murmur heard. Pulmonary:     Effort: Pulmonary effort is normal. No respiratory distress.     Breath sounds: Normal breath sounds.  Musculoskeletal:        General: Normal range of motion.     Cervical back: Normal range of motion and neck supple.     Right lower leg: No edema.     Left lower leg: No edema.  Skin:    General: Skin is warm and dry.  Neurological:     Mental Status: She is alert and oriented to person, place, and time.  Psychiatric:        Mood and Affect: Mood normal.        Behavior: Behavior normal.        Thought Content: Thought content normal.        Judgment: Judgment normal.      No results found for any visits on 04/24/23.  Last CBC Lab Results  Component Value Date   WBC 7.8 04/13/2023   HGB 12.5  04/13/2023   HCT 38.2 04/13/2023   MCV 87.2 04/13/2023   MCH 28.5 04/13/2023   RDW 14.2 04/13/2023   PLT 78 (L) 04/13/2023    Last metabolic panel Lab Results  Component Value Date   GLUCOSE 148 (H) 04/13/2023   NA 142 04/13/2023   K 3.4 (L) 04/13/2023   CL 112 (H) 04/13/2023   CO2 23 04/13/2023   BUN 15 04/13/2023   CREATININE 0.88 04/13/2023   GFRNONAA >60 04/13/2023   CALCIUM 8.6 (L) 04/13/2023   PHOS 2.9 04/13/2023   PROT 6.3 (L) 04/13/2023   ALBUMIN 2.4 (L) 04/13/2023   BILITOT 0.6 04/13/2023   ALKPHOS 101 04/13/2023   AST 110 (H) 04/13/2023   ALT 223 (H) 04/13/2023   ANIONGAP 7 04/13/2023   Last lipids Lab Results  Component Value Date   CHOL 171 02/03/2023   HDL 36.10 (L) 02/03/2023   LDLCALC 103 (H) 08/12/2022   LDLDIRECT 106.0 02/03/2023   TRIG 287.0 (H) 02/03/2023   CHOLHDL 5 02/03/2023   Last hemoglobin A1c Lab Results  Component Value Date   HGBA1C 6.8 (H) 02/03/2023   Last thyroid functions Lab Results  Component Value Date   TSH 4.49 12/08/2018   Last vitamin D Lab Results  Component Value Date   VD25OH 37.30 09/01/2017   Last vitamin B12 and Folate Lab Results  Component Value Date   VITAMINB12 1,336 (H) 08/07/2022   FOLATE 19.4 08/07/2022      The 10-year ASCVD risk score (Arnett DK, et al., 2019) is: 22.8%    Assessment & Plan:   Problem List Items Addressed This Visit       Unprioritized   Multiple myeloma (HCC)   Essential hypertension   Uncontrolled type 2 diabetes mellitus with hyperglycemia (HCC)    hgba1c acceptable, minimize simple carbs. Increase exercise as tolerated. Continue current meds    Lab Results  Component Value Date   HGBA1C 6.8 (H) 02/03/2023          Relevant Medications   Insulin Pen Needle (NOVOFINE PEN NEEDLE) 32G X 6 MM MISC   Other Relevant Orders   Basic metabolic panel   CBC with Differential/Platelet   Magnesium   Phosphorus   Hypernatremia    Check labs today      Hyperlipidemia associated with type 2 diabetes mellitus (HCC)    Encourage heart healthy diet such as MIND or DASH diet, increase exercise,  avoid trans fats, simple carbohydrates and processed foods, consider a krill or fish or flaxseed oil cap daily.        Frontotemporal dementia (HCC)    Per neuro       Other Visit Diagnoses     Type 2 diabetes mellitus with stage 3a chronic kidney disease, with long-term current use of insulin (HCC)    -  Primary     Assessment and Plan    Diabetes Mellitus: Improved glycemic control with Tresiba 14 units daily and Januvia 100mg  daily. Blood glucose levels have been decreasing since hospital discharge. Patient is also consuming Glucerna. -Continue Tresiba 14 units daily and Januvia 100mg  daily. -Reduce blood glucose checks to once or twice daily at different times.  General Health Maintenance: Upcoming neurology appointment on August 12th. Home health is involved in care. -Repeat CBC today. -Continue home health visits for vital checks and blood sugar monitoring. -Continue with neurology appointment as scheduled.        No follow-ups on file.    Donato Schultz,  DO

## 2023-04-29 DIAGNOSIS — E860 Dehydration: Secondary | ICD-10-CM | POA: Diagnosis not present

## 2023-04-29 DIAGNOSIS — G9341 Metabolic encephalopathy: Secondary | ICD-10-CM | POA: Diagnosis not present

## 2023-04-29 DIAGNOSIS — E8729 Other acidosis: Secondary | ICD-10-CM | POA: Diagnosis not present

## 2023-04-29 DIAGNOSIS — L89312 Pressure ulcer of right buttock, stage 2: Secondary | ICD-10-CM | POA: Diagnosis not present

## 2023-04-29 DIAGNOSIS — E1165 Type 2 diabetes mellitus with hyperglycemia: Secondary | ICD-10-CM | POA: Diagnosis not present

## 2023-04-29 DIAGNOSIS — F03C0 Unspecified dementia, severe, without behavioral disturbance, psychotic disturbance, mood disturbance, and anxiety: Secondary | ICD-10-CM | POA: Diagnosis not present

## 2023-04-29 DIAGNOSIS — M5412 Radiculopathy, cervical region: Secondary | ICD-10-CM | POA: Diagnosis not present

## 2023-04-29 DIAGNOSIS — E870 Hyperosmolality and hypernatremia: Secondary | ICD-10-CM | POA: Diagnosis not present

## 2023-04-29 DIAGNOSIS — C9001 Multiple myeloma in remission: Secondary | ICD-10-CM | POA: Diagnosis not present

## 2023-04-30 ENCOUNTER — Telehealth: Payer: Self-pay | Admitting: Family Medicine

## 2023-04-30 NOTE — Telephone Encounter (Signed)
Forms in folder for sig

## 2023-04-30 NOTE — Telephone Encounter (Signed)
Victoria Gates from [name] is calling to follow up on orders that they sent in. The order #'s : [id number], [id number], and [id number]. Please call her at [phone] to advise status or fax the orders back as they are outstanding.

## 2023-05-01 ENCOUNTER — Ambulatory Visit: Payer: Self-pay | Admitting: Licensed Clinical Social Worker

## 2023-05-01 DIAGNOSIS — L89312 Pressure ulcer of right buttock, stage 2: Secondary | ICD-10-CM | POA: Diagnosis not present

## 2023-05-01 DIAGNOSIS — M5412 Radiculopathy, cervical region: Secondary | ICD-10-CM | POA: Diagnosis not present

## 2023-05-01 DIAGNOSIS — C9001 Multiple myeloma in remission: Secondary | ICD-10-CM | POA: Diagnosis not present

## 2023-05-01 DIAGNOSIS — F03C0 Unspecified dementia, severe, without behavioral disturbance, psychotic disturbance, mood disturbance, and anxiety: Secondary | ICD-10-CM | POA: Diagnosis not present

## 2023-05-01 DIAGNOSIS — E1165 Type 2 diabetes mellitus with hyperglycemia: Secondary | ICD-10-CM | POA: Diagnosis not present

## 2023-05-01 DIAGNOSIS — E860 Dehydration: Secondary | ICD-10-CM | POA: Diagnosis not present

## 2023-05-01 DIAGNOSIS — E8729 Other acidosis: Secondary | ICD-10-CM | POA: Diagnosis not present

## 2023-05-01 DIAGNOSIS — G9341 Metabolic encephalopathy: Secondary | ICD-10-CM | POA: Diagnosis not present

## 2023-05-01 DIAGNOSIS — E870 Hyperosmolality and hypernatremia: Secondary | ICD-10-CM | POA: Diagnosis not present

## 2023-05-01 NOTE — Patient Outreach (Signed)
  Care Coordination  Follow Up Visit Note   05/01/2023 Name: Victoria Gates MRN: [id number] DOB: [birth year]  Victoria Gates is a 72 y.o. year old female who sees Victoria Gates, Victoria Congress, DO for primary care. I spoke with  Victoria Gates's husband by phone today.  What matters to the patients health and wellness today?    Husband reports things are going well, he is making progress with managing caregiver stress and reports no new needs at this time.  He will work with Medical illustrator and notify her if additional LCSW needs arise.   Goals Addressed             This Visit's Progress    COMPLETED: Manage Caregiver stress       Activities and task to complete in order to accomplish goals.  Husband will assist I have scheduled a phone appointment with the RN Care Manager she will provide educational informational and assist you with managing your health needs related to diabetes . Keep all upcoming appointment discussed today Continue with compliance of taking medication prescribed by Doctor I am glad you have worked on your Self Support options with your family and things are going well  Bring copy of Advance Directive to be scanned into chart         SDOH assessments and interventions completed:  Yes  SDOH Interventions Today    Flowsheet Row Most Recent Value  SDOH Interventions   Housing Interventions Intervention Not Indicated  [provided my husband]       Care Coordination Interventions:  Yes, provided  Interventions Today    Flowsheet Row Most Recent Value  Chronic Disease   Chronic disease during today's visit Diabetes, Hypertension (HTN)  General Interventions   General Interventions Discussed/Reviewed General Interventions Reviewed, Referral to Nurse  Mental Health Interventions   Mental Health Discussed/Reviewed Mental Health Reviewed  [caregiver stress]  Pharmacy Interventions   Pharmacy Dicussed/Reviewed Pharmacy Topics Reviewed  [met with pharmacy]  Advanced  Directive Interventions   Advanced Directives Discussed/Reviewed Advanced Directives Reviewed  Victoria Gates documents will bring copies to office]       Follow up plan: No further intervention required for LCSW at this time Follow up call scheduled for 05/09/23 with RN care manager    Encounter Outcome:  Pt. Visit Completed   Victoria Hines, LCSW Social Work Care Coordination  [facility] Victoria Gates Victoria Gates [phone]

## 2023-05-01 NOTE — Patient Instructions (Signed)
Social Work Visit Information  Thank you for taking time to visit with me today. Please don't hesitate to contact me if I can be of assistance to you.   Following are the goals we discussed today:   Goals Addressed             This Visit's Progress    COMPLETED: Manage Caregiver stress       Activities and task to complete in order to accomplish goals.  Husband will assist I have scheduled a phone appointment with the RN Care Manager she will provide educational informational and assist you with managing your health needs related to diabetes . Keep all upcoming appointment discussed today Continue with compliance of taking medication prescribed by Doctor I am glad you have worked on your Self Support options with your family and things are going well  Bring copy of Advance Directive to be scanned into chart          Patient's husband does not desire continued follow-up by social work. They will contact the office if needed  Please call the care guide team at [phone] if you need to cancel or reschedule your appointment.   If you or anyone you know are experiencing a Mental Health or Behavioral Health Crisis or need someone to talk to, please call the Suicide and Crisis Lifeline: 988 call the [place] National Suicide Prevention Lifeline: 816-109-2662 or TTY: 941-281-2390 TTY (716) 803-5068) to talk to a trained counselor call 1-800-273-TALK (toll free, 24 hour hotline) go to [facility] [address], [place] [phone])   Patient's husband verbalizes understanding of instructions and care plan provided today and agrees to view in [place]. Active MyChart status and patient understanding of how to access instructions and care plan via MyChart confirmed with patient.       Sammuel Hines, LCSW Social Work Care Coordination  [facility] Emmie Niemann [employer] [phone]

## 2023-05-06 DIAGNOSIS — E860 Dehydration: Secondary | ICD-10-CM | POA: Diagnosis not present

## 2023-05-06 DIAGNOSIS — G9341 Metabolic encephalopathy: Secondary | ICD-10-CM | POA: Diagnosis not present

## 2023-05-06 DIAGNOSIS — M5412 Radiculopathy, cervical region: Secondary | ICD-10-CM | POA: Diagnosis not present

## 2023-05-06 DIAGNOSIS — E1165 Type 2 diabetes mellitus with hyperglycemia: Secondary | ICD-10-CM | POA: Diagnosis not present

## 2023-05-06 DIAGNOSIS — E8729 Other acidosis: Secondary | ICD-10-CM | POA: Diagnosis not present

## 2023-05-06 DIAGNOSIS — F03C0 Unspecified dementia, severe, without behavioral disturbance, psychotic disturbance, mood disturbance, and anxiety: Secondary | ICD-10-CM | POA: Diagnosis not present

## 2023-05-06 DIAGNOSIS — L89312 Pressure ulcer of right buttock, stage 2: Secondary | ICD-10-CM | POA: Diagnosis not present

## 2023-05-06 DIAGNOSIS — C9001 Multiple myeloma in remission: Secondary | ICD-10-CM | POA: Diagnosis not present

## 2023-05-06 DIAGNOSIS — E870 Hyperosmolality and hypernatremia: Secondary | ICD-10-CM | POA: Diagnosis not present

## 2023-05-07 ENCOUNTER — Ambulatory Visit (INDEPENDENT_AMBULATORY_CARE_PROVIDER_SITE_OTHER): Payer: Medicare HMO | Admitting: Pharmacist

## 2023-05-07 DIAGNOSIS — R7989 Other specified abnormal findings of blood chemistry: Secondary | ICD-10-CM

## 2023-05-07 DIAGNOSIS — E1122 Type 2 diabetes mellitus with diabetic chronic kidney disease: Secondary | ICD-10-CM

## 2023-05-07 DIAGNOSIS — N1831 Chronic kidney disease, stage 3a: Secondary | ICD-10-CM

## 2023-05-07 DIAGNOSIS — E1169 Type 2 diabetes mellitus with other specified complication: Secondary | ICD-10-CM

## 2023-05-07 DIAGNOSIS — Z794 Long term (current) use of insulin: Secondary | ICD-10-CM

## 2023-05-07 DIAGNOSIS — E785 Hyperlipidemia, unspecified: Secondary | ICD-10-CM

## 2023-05-07 NOTE — Progress Notes (Signed)
05/07/2023 Name: Victoria Gates MRN: [id number] DOB: 1951-01-12  Chief Complaint  Patient presents with   Diabetes   Hyperlipidemia    Victoria Gates is a 72 y.o. year old female who presented for a telephone visit.   They were referred to the pharmacist by their PCP for assistance in managing diabetes.  Spoke with patient's husband day. Patient has dementia and family administers medications.    Subjective:  Care Team: Primary Care Provider: Zola Button, Grayling Congress, DO ; Next Scheduled Visit: not currently scheduled - Last visit was 04/24/2023 Neurologist: Dr Allena Katz; Next Scheduled Visit: 05/12/2023 Hematology / Oncology - Dr Ivonne Andrew; Next Scheduled Visit: 09/23/2023  Patient presented to ED 04/10/2023 - admitted to hospital 04/10/2023 to 04/13/2023 [facility] for hyperglycemia, dehydration and elevated LFTs.  Blood glucose on arrival in ED was 387 on point of care check but was 460 on BMET checked around the same time. Patient given 5 units of insulin aspart and transferred to inpatient.  Blood glucose 5 hours later was 236 and then 12 hours later 252.  On 04/11/2023 was 179 >> 129  >> 64 >> 124 >> 250.  On 04/12/2023 was 203 >> 331 >> 322 >> 408 (6 units)  >> 328.  On 04/13/2023 was 159 >> 286 Also note elevated LFTs and ARI on arrival.  eFGR was 38 on admission but improved to > 60 by discharge.   04/12/2023 Abdominal ultrasound due to elevated LFTs IMPRESSION: 1. Very limited examination. Liver appears to be heterogeneous. No definite biliary dilatation.2. Probable small decompressed gallbladder but limited evaluation. Medication Changes at discharge:  Started ondansetron 4mg  every 6 hours as needed for nausea.  Glucerna changed to drink 3 times a day between meals   Current medications: Januvia 100mg  daily, Tresiba 14 units daily -   Novolog sliding scale (blood glucose 200 to 250 - give 2 units; 251 to 300 - give 4 units; 301 to 350 - give 6 units 351 to 400  give 8 units, if blood glucose > 400 take 10 units and call physician); Per Mr. Gaylor he has not given supplemental / correction dose of Novolog since 04/17/2023  Medications tried in the past: metformin - tried 07/2022 - stopped due to nausea and vomiting.  Current glucose readings from 8/1 to 8/7 - checking 3 to 4 times a day Morning: 114, 133, 112, 103, 105, 116, 118 Noon: 94, 97, 101, 115, 85, 90, 91 Evening: 122, 132, 100, 108, 102, 94, 124 Night:  98, 126, 118, 114, 142, 101  Patient denies hypoglycemic s/sx including no dizziness, shakiness, sweating. 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   Patient denies hyperglycemic symptoms including no polyuria, polydipsia, polyphagia, nocturia, neuropathy, blurred vision.  Current meal patterns  Patient is eating better. Eating 3 meals per day and drinking Glucerna shakes  Husband states he has been putting water in empty Glucerna bottle and patient will drink water more readily this way.  Hyperlipidemia/ASCVD Risk Reduction  Current lipid lowering medications: pitavastatin 4mg  daily - restarted 04/21/2023 per husband this was direction on discharge report. .  Patient has been taking Livalo / pitavastatin since 2020. LFTs have been WNL in past.   Medications tried in the past: atorvastatin and rosuvastatin - both caused myalgias  Antiplatelet regimen: none   Objective:  Lab Results  Component Value Date   HGBA1C 6.8 (  H) 02/03/2023    Lab Results  Component Value Date   CREATININE 0.93 04/24/2023   BUN 11 04/24/2023   NA 135 04/24/2023   K 4.0 04/24/2023   CL 100 04/24/2023   CO2 26 04/24/2023    Lab Results  Component Value Date   CHOL 171 02/03/2023   HDL 36.10 (L) 02/03/2023   LDLCALC 103 (H) 08/12/2022   LDLDIRECT 106.0 02/03/2023   TRIG 287.0 (H) 02/03/2023   CHOLHDL 5 02/03/2023    Medications  Reviewed Today     Reviewed by Henrene Pastor, RPH-CPP (Pharmacist) on 05/07/23 at 1612  Med List Status: <None>   Medication Order Taking? Sig Documenting Provider Last Dose Status Informant  docusate sodium (COLACE) 100 MG capsule [id number]  Take 100 mg by mouth daily. [provider]  Active Spouse/Significant Other, Pharmacy Records  feeding supplement, GLUCERNA SHAKE, (GLUCERNA SHAKE) LIQD [id number] Yes Take 237 mLs by mouth 3 (three) times daily between meals. Marguerita Merles [place], [place] Taking Active   glucose blood test strip [id number] Yes 1 each by Other route 4 (four) times daily. Dx: type 2 DM with long term insulin use E11.22; Z79.4 Zola Button, Grayling Congress, DO Taking Active Spouse/Significant Other, Pharmacy Records  insulin degludec (TRESIBA FLEXTOUCH) 100 UNIT/ML FlexTouch Pen [id number] Yes Inject 14 Units into the skin daily. Henrene Pastor, RPH-CPP Taking Active   insulin lispro (HUMALOG KWIKPEN) 100 UNIT/ML KwikPen [id number] No PER SLIDING SCALE----- NO MORE THAN 40 U / DAY  Patient not taking: Reported on 05/07/2023   Donato Schultz, DO Not Taking Active Spouse/Significant Other, Pharmacy Records  Insulin Pen Needle (NOVOFINE PEN NEEDLE) 32G X 6 MM MISC [id number] Yes As directed Donato Schultz, DO Taking Active   lansoprazole (PREVACID) 30 MG capsule [id number] Yes Take 30 mg by mouth daily with breakfast.  [provider] Taking Active Spouse/Significant Other, Pharmacy Records           Med Note Chilton Si, STACY L   Wed May 01, 2016  8:45 AM)    mirtazapine (REMERON SOL-TAB) 30 MG disintegrating tablet [id number] Yes TAKE 1 TABLET BY MOUTH EVERYDAY AT BEDTIME Donato Schultz, DO Taking Active Spouse/Significant Other, Pharmacy Records  Multiple Vitamin (MULTIVITAMIN WITH MINERALS) TABS tablet [id number] Yes Take 1 tablet by mouth daily. Noralee Stain, DO Taking Active Spouse/Significant Other, Pharmacy Records           Med Note Epimenio Sarin, Carlota Raspberry Mar 24, 2023  2:27 PM)    Pitavastatin Calcium 4 MG TABS [id number] Yes Take 1 tablet (4 mg total) by mouth daily. Marguerita Merles Kappa, DO Taking Active            Med Note Clydie Braun,  B   Mon Apr 14, 2023 10:25 AM) On hold due to elevated LFTs  rivastigmine (EXELON) 1.5 MG capsule [id number] Yes Take 1 capsule (1.5 mg total) by mouth 2 (two) times daily.  Patient taking differently: Take 1.5 mg by mouth 2 (two) times daily. 0600 and 1200   Nita Sickle K, DO Taking Active Spouse/Significant Other, Pharmacy Records  sitaGLIPtin (JANUVIA) 100 MG tablet [id number] Yes Take 1 tablet (100 mg total) by mouth daily. Donato Schultz, DO Taking Active Spouse/Significant Other, Pharmacy Records              Assessment/Plan:   Diabetes: blood glucose has improved since starting Tresiba - Reviewed goal A1c, goal fasting, and goal 2 hour post prandial glucose -  continue Glucerna nutritional drink 3 times a day - Continue Tresiba 14 units once a daily - Continue Januvia 100mg  daily. - Consider SGLT2 in future but will wait to see if LFTs improve.  - Continue to use Humalog / Novolg per sliding scale if needed.    Hyperlipidemia/ASCVD Risk Reduction: Currently controlled. But recent elevated LFTs.  Ordered Home Health to check CMP  / LFTs at next visit - which is scheduled for 05/13/2023. (Should received results in 24 to 48 hours)  Continue pitavastatin for now.    Clinical Pharmacist Practitioner will check on labs next week.  Phone follow up in 1 month.    Henrene Pastor, PharmD Clinical Pharmacist [facility]

## 2023-05-09 ENCOUNTER — Ambulatory Visit: Payer: Self-pay

## 2023-05-09 NOTE — Patient Outreach (Signed)
  Care Coordination   Initial Visit Note   05/09/2023 Name: Victoria Gates MRN: [id number] DOB: 09/03/1951  Victoria Gates is a 72 y.o. year old female who sees Zola Button, Grayling Congress, DO for primary care. I spoke with  Spouse Victoria Gates by phone today.  What matters to the patients health and wellness today?  Patient with history of dementia. Per Mr. Berardino, Victoria Gates is doing well. He reports her blood sugar is "great" and states she is eating well. BS today ac 94 and states her average BS range 100-102 and reports Lowest blood sugar 85 on 05/05/23. Last office visit with PCP on 04/24/23. Visit with clinical pharmacist on 05/07/23. Ms. Guerino denies any concerns or questions as this time.  Goals Addressed             This Visit's Progress    continue to improve post hospitalization       Interventions Today    Flowsheet Row Most Recent Value  Chronic Disease   Chronic disease during today's visit Diabetes, Hypertension (HTN), Other  [admisstion 7/14-7/14 with hyponatremia/hyperglycemia,  dementia]  General Interventions   General Interventions Discussed/Reviewed General Interventions Reviewed, Doctor Visits, Communication with  Doctor Visits Discussed/Reviewed Doctor Visits Discussed, Doctor Visits Reviewed, PCP  PCP/Specialist Visits Compliance with follow-up visit  [reviewed upcoming/schedule appointments]  Exercise Interventions   Exercise Discussed/Reviewed Physical Activity  [discussed physical activity as tolerated]  Education Interventions   Education Provided Provided Education  Provided Verbal Education On Medication, Blood Sugar Monitoring, Exercise, When to see the doctor  [advised to take medications as recommended, check BS as recommended and notify provider if outside recommended range, encouraged to attend provider visits as scheduled]  Nutrition Interventions   Nutrition Discussed/Reviewed Nutrition Discussed  Pharmacy Interventions   Pharmacy Dicussed/Reviewed  Pharmacy Topics Discussed  [clinical pharmacist involved and seen 05/07/23.]  Safety Interventions   Safety Discussed/Reviewed Home Safety, Safety Discussed  Home Safety Need for home safety assessment  [assess home safety needs, per husband, patient does not wander anymore, active with home health nursing, husband reports he is home with patient. denies any concerns at this time]            SDOH assessments and interventions completed:  No  Care Coordination Interventions:  Yes, provided   Follow up plan: Follow up call scheduled for 06/09/23    Encounter Outcome:  Pt. Visit Completed   Kathyrn Sheriff, RN, MSN, BSN, CCM [facility] Care Coordinator [phone]

## 2023-05-09 NOTE — Patient Instructions (Signed)
Visit Information  Thank you for taking time to visit with me today. Please don't hesitate to contact me if I can be of assistance to you.   Following are the goals we discussed today:  Attend provider visits as scheduled Take medications as prescribed Continue to check blood sugars as recommended by provider and notify provider if outside recommended range. Contact provider with health questions or concerns as needed Participate with home health as recommended   Our next appointment is by telephone on 06/09/23 at 1:15 pm.  Please call the care guide team at [phone] if you need to cancel or reschedule your appointment.   If you are experiencing a Mental Health or Behavioral Health Crisis or need someone to talk to, please call the Suicide and Crisis Lifeline: 988 call the [place] National Suicide Prevention Lifeline: 8066376043 or TTY: (920)287-7952 TTY 830-756-9429) to talk to a trained counselor call 1-800-273-TALK (toll free, 24 hour hotline)   Kathyrn Sheriff, RN, MSN, BSN, CCM [facility] Care Coordinator [phone]

## 2023-05-12 ENCOUNTER — Ambulatory Visit: Payer: BC Managed Care – PPO | Admitting: Neurology

## 2023-05-12 ENCOUNTER — Encounter: Payer: Self-pay | Admitting: Neurology

## 2023-05-12 VITALS — BP 110/78 | HR 73 | Ht 68.0 in | Wt 181.0 lb

## 2023-05-12 DIAGNOSIS — G3109 Other frontotemporal dementia: Secondary | ICD-10-CM

## 2023-05-12 DIAGNOSIS — F0280 Dementia in other diseases classified elsewhere without behavioral disturbance: Secondary | ICD-10-CM

## 2023-05-12 DIAGNOSIS — G1229 Other motor neuron disease: Secondary | ICD-10-CM

## 2023-05-12 MED ORDER — RIVASTIGMINE TARTRATE 1.5 MG PO CAPS: 1.5000 mg | ORAL_CAPSULE | Freq: Two times a day (BID) | ORAL | 3 refills | Status: DC

## 2023-05-12 NOTE — Progress Notes (Signed)
Follow-up Visit   Date: 05/12/23   AERYAL MICHON MRN: [id number] DOB: [birth year]   Interim History: RANA BUHAY is a 72 y.o. African American female returning to the clinic for follow-up of frontotemporal dementia (2019).  The patient was accompanied to the clinic by husband who also provides collateral information.  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  Initially, symptoms manifesting with behavior changes, but over the years, speech has also become involved.  Behavior and PBA is stable on SSRI (previously on sertraline, now on mirtazepine)  Overall, she is dependent on all ADLs except for feeding.  Her husband provides excellent support and care.   - Continue rivastigmine 1.5mg  BID  - Continue mirtazepine 30mg  daily  Return to clinic in 1 year  ------------------------------------------------ UPDATE 05/12/2023:  She is here for 1 year visit with her husband.  Husband reports that she has been stable, without any significant worsening.  He is the primary caregiver and retired a month ago.  She is dependent on all ADLs, except feeding.  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  Prior to that, he was having a paid caregiver at home.  She continues to have laughing spells and mood is cheerful.  She follows instructions intermittently.  No home safety issues.   Medications:  Current Outpatient Medications on File Prior to Visit  Medication Sig Dispense Refill   docusate sodium (COLACE) 100 MG capsule Take 100 mg by mouth daily.     feeding supplement, GLUCERNA SHAKE, (GLUCERNA SHAKE) LIQD Take 237 mLs by mouth 3 (three) times daily between meals. 2370 mL 0   glucose blood test strip 1 each by Other route 4 (four) times daily. Dx: type 2 DM with long term insulin use E11.22; Z79.4 200 each 2   insulin degludec (TRESIBA FLEXTOUCH) 100 UNIT/ML FlexTouch Pen Inject 14 Units into the  skin daily.     insulin lispro (HUMALOG KWIKPEN) 100 UNIT/ML KwikPen PER SLIDING SCALE----- NO MORE THAN 40 U / DAY 15 mL 3   Insulin Pen Needle (NOVOFINE PEN NEEDLE) 32G X 6 MM MISC As directed 200 each 1   lansoprazole (PREVACID) 30 MG capsule Take 30 mg by mouth daily with breakfast.      mirtazapine (REMERON SOL-TAB) 30 MG disintegrating tablet TAKE 1 TABLET BY MOUTH EVERYDAY AT BEDTIME 90 tablet 1   Multiple Vitamin (MULTIVITAMIN WITH MINERALS) TABS tablet Take 1 tablet by mouth daily.     Pitavastatin Calcium 4 MG TABS Take 1 tablet (4 mg total) by mouth daily. 90 tablet 3   rivastigmine (EXELON) 1.5 MG capsule Take 1 capsule (1.5 mg total) by mouth 2 (two) times daily. (Patient taking differently: Take 1.5 mg by mouth 2 (two) times daily. 0600 and 1200) 180 capsule 3   sitaGLIPtin (JANUVIA) 100 MG tablet Take 1 tablet (100 mg total) by mouth daily. 90 tablet 1   No current facility-administered medications on file prior to visit.    Allergies:  Allergies  Allergen Reactions   Sulfonamide Derivatives Other (See Comments)    Unknown reaction    Atorvastatin Other (See Comments)    myalgias   Rosuvastatin Other (See Comments)    myalgias    Vital Signs:  BP 110/78   Pulse 73   Ht 5\' 8"  (1.727 m)   Wt 181 lb (82.1 kg)   SpO2 98%   BMI 27.52 kg/m  Neurological Exam: MENTAL STATUS including orientation to person, unable to correctly identify address or date.  Attention and concentration is poor, fund of knowledge is poor.  There is less explosive laughter today. Psychomotor agitation seems less, she is not grabbing her husband as she previously.  She is well-groomed.  Paucity of speech, she is able to say works which are comprehensible, but also has a lot of nonsensical sounds such as  "ahh, yay" throughout the visit.    CRANIAL NERVES:   Normal conjugate, extra-ocular eye movements in all directions of gaze.  No ptosis.   MOTOR:  She is antigravity throughout.  Motor strength  able to be tested due to inability to follow commands.  COORDINATION/GAIT:  Gait narrow based and stable, she holds onto her husband for guidance.   Data: MRI brain wwo contrast 03/08/2019:   1. No acute intracranial abnormality. 2. Cerebral atrophy primarily affecting the temporal lobes.  Neurocognitive testing 06/11/2019:   Patterns of dysfunction appear most consistent with the semantic variant of frontotemporal dementia, given evidence for severe deficits in confrontation naming, poor verbal comprehension, and concerns regarding the loss of knowledge of visual stimuli   Thank you for allowing me to participate in patient's care.  If I can answer any additional questions, I would be pleased to do so.    Sincerely,     K. Allena Katz, DO

## 2023-05-13 ENCOUNTER — Other Ambulatory Visit: Payer: Self-pay | Admitting: Neurology

## 2023-05-13 DIAGNOSIS — M5412 Radiculopathy, cervical region: Secondary | ICD-10-CM | POA: Diagnosis not present

## 2023-05-13 DIAGNOSIS — E1165 Type 2 diabetes mellitus with hyperglycemia: Secondary | ICD-10-CM | POA: Diagnosis not present

## 2023-05-13 DIAGNOSIS — E7800 Pure hypercholesterolemia, unspecified: Secondary | ICD-10-CM | POA: Diagnosis not present

## 2023-05-13 DIAGNOSIS — F03C0 Unspecified dementia, severe, without behavioral disturbance, psychotic disturbance, mood disturbance, and anxiety: Secondary | ICD-10-CM | POA: Diagnosis not present

## 2023-05-13 DIAGNOSIS — E860 Dehydration: Secondary | ICD-10-CM | POA: Diagnosis not present

## 2023-05-13 DIAGNOSIS — G9341 Metabolic encephalopathy: Secondary | ICD-10-CM | POA: Diagnosis not present

## 2023-05-13 DIAGNOSIS — E8729 Other acidosis: Secondary | ICD-10-CM | POA: Diagnosis not present

## 2023-05-13 DIAGNOSIS — E870 Hyperosmolality and hypernatremia: Secondary | ICD-10-CM | POA: Diagnosis not present

## 2023-05-13 DIAGNOSIS — L89312 Pressure ulcer of right buttock, stage 2: Secondary | ICD-10-CM | POA: Diagnosis not present

## 2023-05-13 DIAGNOSIS — C9001 Multiple myeloma in remission: Secondary | ICD-10-CM | POA: Diagnosis not present

## 2023-05-14 LAB — LAB REPORT - SCANNED: EGFR: 64

## 2023-05-14 NOTE — Telephone Encounter (Signed)
Wanda from [facility] following up on orders. Please send back orders for patient that were faxed on 05/06/23. #[id number], #[phone]

## 2023-05-20 DIAGNOSIS — E870 Hyperosmolality and hypernatremia: Secondary | ICD-10-CM | POA: Diagnosis not present

## 2023-05-20 DIAGNOSIS — C9001 Multiple myeloma in remission: Secondary | ICD-10-CM | POA: Diagnosis not present

## 2023-05-20 DIAGNOSIS — L89312 Pressure ulcer of right buttock, stage 2: Secondary | ICD-10-CM | POA: Diagnosis not present

## 2023-05-20 DIAGNOSIS — F03C0 Unspecified dementia, severe, without behavioral disturbance, psychotic disturbance, mood disturbance, and anxiety: Secondary | ICD-10-CM | POA: Diagnosis not present

## 2023-05-20 DIAGNOSIS — G9341 Metabolic encephalopathy: Secondary | ICD-10-CM | POA: Diagnosis not present

## 2023-05-20 DIAGNOSIS — M5412 Radiculopathy, cervical region: Secondary | ICD-10-CM | POA: Diagnosis not present

## 2023-05-20 DIAGNOSIS — E8729 Other acidosis: Secondary | ICD-10-CM | POA: Diagnosis not present

## 2023-05-20 DIAGNOSIS — E860 Dehydration: Secondary | ICD-10-CM | POA: Diagnosis not present

## 2023-05-20 DIAGNOSIS — E1165 Type 2 diabetes mellitus with hyperglycemia: Secondary | ICD-10-CM | POA: Diagnosis not present

## 2023-05-24 DIAGNOSIS — R404 Transient alteration of awareness: Secondary | ICD-10-CM | POA: Diagnosis not present

## 2023-05-24 DIAGNOSIS — R1111 Vomiting without nausea: Secondary | ICD-10-CM | POA: Diagnosis not present

## 2023-05-24 DIAGNOSIS — R001 Bradycardia, unspecified: Secondary | ICD-10-CM | POA: Diagnosis not present

## 2023-05-24 DIAGNOSIS — I499 Cardiac arrhythmia, unspecified: Secondary | ICD-10-CM | POA: Diagnosis not present

## 2023-05-24 DIAGNOSIS — Z743 Need for continuous supervision: Secondary | ICD-10-CM | POA: Diagnosis not present

## 2023-05-27 ENCOUNTER — Telehealth: Payer: Self-pay | Admitting: Family Medicine

## 2023-05-27 NOTE — Telephone Encounter (Signed)
Victoria Gates- husband called to advise that pt passed away 2023/06/12 (2023/05/29) morning.

## 2023-05-29 ENCOUNTER — Telehealth: Payer: Self-pay | Admitting: Family Medicine

## 2023-05-29 NOTE — Telephone Encounter (Signed)
Victoria Gates [facility]) called stating that she needed Dr. Laury Axon to update pt's death certificate when possible through the [facility] system.

## 2023-06-01 DEATH — deceased

## 2023-06-04 ENCOUNTER — Telehealth: Payer: Medicare HMO

## 2023-09-16 ENCOUNTER — Other Ambulatory Visit: Payer: Medicare HMO

## 2023-09-23 ENCOUNTER — Ambulatory Visit: Payer: Medicare HMO | Admitting: Internal Medicine

## 2024-05-11 ENCOUNTER — Ambulatory Visit: Payer: Self-pay | Admitting: Neurology
